# Patient Record
Sex: Female | Born: 1975 | Race: Black or African American | Hispanic: No | Smoking: Current every day smoker
Health system: Southern US, Community
[De-identification: ages and names within clinical notes are randomized; demographics above are authoritative.]

## PROBLEM LIST (undated history)

## (undated) DIAGNOSIS — G4730 Sleep apnea, unspecified: Secondary | ICD-10-CM

## (undated) DIAGNOSIS — N393 Stress incontinence (female) (male): Secondary | ICD-10-CM

## (undated) DIAGNOSIS — E119 Type 2 diabetes mellitus without complications: Secondary | ICD-10-CM

## (undated) DIAGNOSIS — N946 Dysmenorrhea, unspecified: Secondary | ICD-10-CM

## (undated) DIAGNOSIS — R87619 Unspecified abnormal cytological findings in specimens from cervix uteri: Secondary | ICD-10-CM

## (undated) DIAGNOSIS — R7303 Prediabetes: Secondary | ICD-10-CM

## (undated) DIAGNOSIS — F32A Depression, unspecified: Secondary | ICD-10-CM

## (undated) DIAGNOSIS — Z87442 Personal history of urinary calculi: Secondary | ICD-10-CM

## (undated) DIAGNOSIS — D259 Leiomyoma of uterus, unspecified: Secondary | ICD-10-CM

## (undated) DIAGNOSIS — K219 Gastro-esophageal reflux disease without esophagitis: Secondary | ICD-10-CM

## (undated) DIAGNOSIS — F4310 Post-traumatic stress disorder, unspecified: Secondary | ICD-10-CM

## (undated) DIAGNOSIS — L309 Dermatitis, unspecified: Secondary | ICD-10-CM

## (undated) DIAGNOSIS — F419 Anxiety disorder, unspecified: Secondary | ICD-10-CM

## (undated) DIAGNOSIS — I10 Essential (primary) hypertension: Secondary | ICD-10-CM

## (undated) HISTORY — DX: Depression, unspecified: F32.A

## (undated) HISTORY — DX: Type 2 diabetes mellitus without complications: E11.9

## (undated) HISTORY — DX: Prediabetes: R73.03

## (undated) HISTORY — DX: Sleep apnea, unspecified: G47.30

## (undated) HISTORY — DX: Essential (primary) hypertension: I10

## (undated) HISTORY — DX: Anxiety disorder, unspecified: F41.9

## (undated) HISTORY — DX: Unspecified abnormal cytological findings in specimens from cervix uteri: R87.619

## (undated) HISTORY — PX: TUBAL LIGATION: SHX77

---

## 2008-10-30 ENCOUNTER — Emergency Department (HOSPITAL_COMMUNITY): Admission: EM | Admit: 2008-10-30 | Discharge: 2008-10-30 | Payer: Self-pay | Admitting: Emergency Medicine

## 2009-04-14 ENCOUNTER — Emergency Department (HOSPITAL_COMMUNITY): Admission: EM | Admit: 2009-04-14 | Discharge: 2009-04-14 | Payer: Self-pay | Admitting: Emergency Medicine

## 2009-05-19 ENCOUNTER — Emergency Department (HOSPITAL_COMMUNITY): Admission: EM | Admit: 2009-05-19 | Discharge: 2009-05-19 | Payer: Self-pay | Admitting: Emergency Medicine

## 2009-10-06 ENCOUNTER — Emergency Department (HOSPITAL_COMMUNITY): Admission: EM | Admit: 2009-10-06 | Discharge: 2009-10-06 | Payer: Self-pay | Admitting: Emergency Medicine

## 2009-10-12 ENCOUNTER — Emergency Department (HOSPITAL_COMMUNITY): Admission: EM | Admit: 2009-10-12 | Discharge: 2009-10-12 | Payer: Self-pay | Admitting: Emergency Medicine

## 2009-12-09 ENCOUNTER — Emergency Department (HOSPITAL_COMMUNITY): Admission: EM | Admit: 2009-12-09 | Discharge: 2009-12-09 | Payer: Self-pay | Admitting: Family Medicine

## 2010-03-19 ENCOUNTER — Emergency Department (HOSPITAL_COMMUNITY): Admission: EM | Admit: 2010-03-19 | Discharge: 2010-03-19 | Payer: Self-pay | Admitting: Emergency Medicine

## 2010-06-24 ENCOUNTER — Emergency Department (HOSPITAL_COMMUNITY): Admission: EM | Admit: 2010-06-24 | Discharge: 2010-06-24 | Payer: Self-pay | Admitting: Emergency Medicine

## 2010-09-04 ENCOUNTER — Emergency Department (HOSPITAL_COMMUNITY): Admission: EM | Admit: 2010-09-04 | Discharge: 2010-09-05 | Payer: Self-pay | Admitting: Emergency Medicine

## 2010-10-30 ENCOUNTER — Emergency Department (HOSPITAL_COMMUNITY)
Admission: EM | Admit: 2010-10-30 | Discharge: 2010-10-30 | Disposition: A | Discharge status: Other Acute Inpatient Hospital | Payer: Self-pay | Source: Home / Self Care | Admitting: Emergency Medicine

## 2010-10-30 ENCOUNTER — Emergency Department (HOSPITAL_COMMUNITY)
Admission: EM | Admit: 2010-10-30 | Discharge: 2010-10-30 | Payer: Self-pay | Source: Home / Self Care | Admitting: Emergency Medicine

## 2010-10-31 ENCOUNTER — Encounter (INDEPENDENT_AMBULATORY_CARE_PROVIDER_SITE_OTHER): Payer: Self-pay | Admitting: Family Medicine

## 2010-10-31 LAB — CONVERTED CEMR LAB
Albumin: 4.3 g/dL (ref 3.5–5.2)
BUN: 8 mg/dL (ref 6–23)
CO2: 23 meq/L (ref 19–32)
Calcium: 9.6 mg/dL (ref 8.4–10.5)
Chloride: 103 meq/L (ref 96–112)
Glucose, Bld: 90 mg/dL (ref 70–99)
Potassium: 4.4 meq/L (ref 3.5–5.3)
Sodium: 139 meq/L (ref 135–145)
T3, Free: 3.2 pg/mL (ref 2.3–4.2)
TSH: 1.091 microintl units/mL (ref 0.350–4.500)
Total Protein: 8.1 g/dL (ref 6.0–8.3)

## 2010-11-14 ENCOUNTER — Encounter (INDEPENDENT_AMBULATORY_CARE_PROVIDER_SITE_OTHER): Payer: Self-pay | Admitting: Family Medicine

## 2010-11-14 LAB — CONVERTED CEMR LAB
Basophils Absolute: 0 10*3/uL (ref 0.0–0.1)
Basophils Relative: 0 % (ref 0–1)
Eosinophils Relative: 3 % (ref 0–5)
Hemoglobin: 13.5 g/dL (ref 12.0–15.0)
Lymphocytes Relative: 23 % (ref 12–46)
MCHC: 32.2 g/dL (ref 30.0–36.0)
Monocytes Absolute: 1.2 10*3/uL — ABNORMAL HIGH (ref 0.1–1.0)
Neutro Abs: 5.7 10*3/uL (ref 1.7–7.7)
Platelets: 355 10*3/uL (ref 150–400)
RDW: 15.2 % (ref 11.5–15.5)
Total Protein, Urine: 32

## 2010-11-22 ENCOUNTER — Ambulatory Visit (HOSPITAL_COMMUNITY)
Admission: RE | Admit: 2010-11-22 | Discharge: 2010-11-22 | Payer: Self-pay | Source: Home / Self Care | Attending: Family Medicine | Admitting: Family Medicine

## 2010-11-23 ENCOUNTER — Encounter (INDEPENDENT_AMBULATORY_CARE_PROVIDER_SITE_OTHER): Payer: Self-pay | Admitting: Family Medicine

## 2010-11-23 LAB — CONVERTED CEMR LAB
ALT: 40 units/L — ABNORMAL HIGH (ref 0–35)
AST: 25 units/L (ref 0–37)
Alkaline Phosphatase: 109 units/L (ref 39–117)
BUN: 14 mg/dL (ref 6–23)
Chloride: 100 meq/L (ref 96–112)
Creatinine, Ser: 0.67 mg/dL (ref 0.40–1.20)
Potassium: 4.3 meq/L (ref 3.5–5.3)

## 2010-11-27 ENCOUNTER — Encounter (INDEPENDENT_AMBULATORY_CARE_PROVIDER_SITE_OTHER): Payer: Self-pay | Admitting: *Deleted

## 2010-11-27 LAB — CONVERTED CEMR LAB
Albumin: 4.1 g/dL (ref 3.5–5.2)
BUN: 17 mg/dL (ref 6–23)
Calcium: 8.8 mg/dL (ref 8.4–10.5)
Chloride: 102 meq/L (ref 96–112)
Creatinine 24 HR UR: 1207 mg/24hr (ref 700–1800)
Creatinine, Ser: 0.87 mg/dL (ref 0.40–1.20)
Creatinine, Urine: 112.3 mg/dL
Glucose, Bld: 139 mg/dL — ABNORMAL HIGH (ref 70–99)
Potassium: 4.2 meq/L (ref 3.5–5.3)
Protein, 24H Urine: 183 mg/24hr — ABNORMAL HIGH (ref 50–100)

## 2010-12-05 ENCOUNTER — Ambulatory Visit (HOSPITAL_COMMUNITY)
Admission: RE | Admit: 2010-12-05 | Discharge: 2010-12-05 | Disposition: A | Discharge status: Home / Self Care | Payer: Self-pay | Source: Ambulatory Visit | Attending: Family Medicine | Admitting: Family Medicine

## 2010-12-05 DIAGNOSIS — R609 Edema, unspecified: Secondary | ICD-10-CM | POA: Insufficient documentation

## 2010-12-05 DIAGNOSIS — R0602 Shortness of breath: Secondary | ICD-10-CM | POA: Insufficient documentation

## 2010-12-08 ENCOUNTER — Emergency Department (HOSPITAL_COMMUNITY)
Admission: EM | Admit: 2010-12-08 | Discharge: 2010-12-08 | Discharge status: Against Medical Advice | Payer: Self-pay | Attending: Emergency Medicine | Admitting: Emergency Medicine

## 2010-12-13 ENCOUNTER — Encounter (INDEPENDENT_AMBULATORY_CARE_PROVIDER_SITE_OTHER): Payer: Self-pay | Admitting: *Deleted

## 2010-12-13 LAB — CONVERTED CEMR LAB: Sed Rate: 11 mm/hr (ref 0–22)

## 2011-01-08 LAB — POCT I-STAT, CHEM 8
BUN: 9 mg/dL (ref 6–23)
Calcium, Ion: 1.04 mmol/L — ABNORMAL LOW (ref 1.12–1.32)
Creatinine, Ser: 0.7 mg/dL (ref 0.4–1.2)
Glucose, Bld: 127 mg/dL — ABNORMAL HIGH (ref 70–99)
Sodium: 140 mEq/L (ref 135–145)
TCO2: 26 mmol/L (ref 0–100)

## 2011-01-08 LAB — POCT URINALYSIS DIPSTICK
Hgb urine dipstick: NEGATIVE
Protein, ur: NEGATIVE mg/dL
Specific Gravity, Urine: 1.025 (ref 1.005–1.030)
Urobilinogen, UA: 0.2 mg/dL (ref 0.0–1.0)
pH: 6 (ref 5.0–8.0)

## 2011-01-12 LAB — POCT I-STAT, CHEM 8
Creatinine, Ser: 0.8 mg/dL (ref 0.4–1.2)
HCT: 47 % — ABNORMAL HIGH (ref 36.0–46.0)
Hemoglobin: 16 g/dL — ABNORMAL HIGH (ref 12.0–15.0)
Potassium: 4.6 mEq/L (ref 3.5–5.1)
Sodium: 137 mEq/L (ref 135–145)
TCO2: 28 mmol/L (ref 0–100)

## 2011-01-12 LAB — DIFFERENTIAL
Basophils Absolute: 0 10*3/uL (ref 0.0–0.1)
Basophils Relative: 0 % (ref 0–1)
Eosinophils Absolute: 0.2 10*3/uL (ref 0.0–0.7)
Eosinophils Relative: 2 % (ref 0–5)
Lymphs Abs: 2.6 10*3/uL (ref 0.7–4.0)
Neutrophils Relative %: 53 % (ref 43–77)

## 2011-01-12 LAB — URINALYSIS, ROUTINE W REFLEX MICROSCOPIC
Bilirubin Urine: NEGATIVE
Glucose, UA: NEGATIVE mg/dL
Hgb urine dipstick: NEGATIVE
Protein, ur: NEGATIVE mg/dL

## 2011-01-12 LAB — POCT PREGNANCY, URINE: Preg Test, Ur: NEGATIVE

## 2011-01-12 LAB — CBC
HCT: 42.7 % (ref 36.0–46.0)
Hemoglobin: 13.8 g/dL (ref 12.0–15.0)
MCH: 23.1 pg — ABNORMAL LOW (ref 26.0–34.0)
MCV: 71.5 fL — ABNORMAL LOW (ref 78.0–100.0)
Platelets: 283 10*3/uL (ref 150–400)
RDW: 15 % (ref 11.5–15.5)
WBC: 8 10*3/uL (ref 4.0–10.5)

## 2011-01-17 LAB — POCT URINALYSIS DIP (DEVICE)
Bilirubin Urine: NEGATIVE
Hgb urine dipstick: NEGATIVE
Ketones, ur: NEGATIVE mg/dL
Protein, ur: NEGATIVE mg/dL
Specific Gravity, Urine: 1.015 (ref 1.005–1.030)
pH: 6 (ref 5.0–8.0)

## 2011-01-17 LAB — RPR: RPR Ser Ql: NONREACTIVE

## 2011-01-17 LAB — GC/CHLAMYDIA PROBE AMP, GENITAL
Chlamydia, DNA Probe: NEGATIVE
GC Probe Amp, Genital: NEGATIVE

## 2011-01-17 LAB — WET PREP, GENITAL

## 2011-01-30 LAB — DIFFERENTIAL
Eosinophils Relative: 2 % (ref 0–5)
Lymphocytes Relative: 25 % (ref 12–46)
Monocytes Relative: 11 % (ref 3–12)
Neutrophils Relative %: 62 % (ref 43–77)

## 2011-01-30 LAB — COMPREHENSIVE METABOLIC PANEL
Albumin: 3.6 g/dL (ref 3.5–5.2)
BUN: 9 mg/dL (ref 6–23)
Calcium: 8.6 mg/dL (ref 8.4–10.5)
Glucose, Bld: 85 mg/dL (ref 70–99)
Total Protein: 7.5 g/dL (ref 6.0–8.3)

## 2011-01-30 LAB — URINALYSIS, ROUTINE W REFLEX MICROSCOPIC
Glucose, UA: NEGATIVE mg/dL
Hgb urine dipstick: NEGATIVE
Leukocytes, UA: NEGATIVE
Protein, ur: 30 mg/dL — AB
Specific Gravity, Urine: 1.028 (ref 1.005–1.030)

## 2011-01-30 LAB — HCG, SERUM, QUALITATIVE: Preg, Serum: NEGATIVE

## 2011-01-30 LAB — CBC
HCT: 34.3 % — ABNORMAL LOW (ref 36.0–46.0)
Hemoglobin: 10.7 g/dL — ABNORMAL LOW (ref 12.0–15.0)
MCHC: 31.1 g/dL (ref 30.0–36.0)
Platelets: 268 10*3/uL (ref 150–400)
RDW: 19.3 % — ABNORMAL HIGH (ref 11.5–15.5)

## 2011-01-30 LAB — URINE MICROSCOPIC-ADD ON

## 2011-02-12 LAB — URINALYSIS, ROUTINE W REFLEX MICROSCOPIC
Nitrite: NEGATIVE
Protein, ur: NEGATIVE mg/dL
Specific Gravity, Urine: 1.03 (ref 1.005–1.030)
Urobilinogen, UA: 1 mg/dL (ref 0.0–1.0)

## 2011-02-12 LAB — URINE MICROSCOPIC-ADD ON

## 2011-02-12 LAB — POCT I-STAT, CHEM 8
BUN: 10 mg/dL (ref 6–23)
Calcium, Ion: 1.12 mmol/L (ref 1.12–1.32)
Chloride: 104 mEq/L (ref 96–112)
Creatinine, Ser: 0.6 mg/dL (ref 0.4–1.2)
Sodium: 139 mEq/L (ref 135–145)
TCO2: 25 mmol/L (ref 0–100)

## 2011-03-09 ENCOUNTER — Inpatient Hospital Stay (INDEPENDENT_AMBULATORY_CARE_PROVIDER_SITE_OTHER)
Admission: RE | Admit: 2011-03-09 | Discharge: 2011-03-09 | Disposition: A | Discharge status: Home / Self Care | Payer: Self-pay | Source: Ambulatory Visit | Attending: Family Medicine | Admitting: Family Medicine

## 2011-03-09 DIAGNOSIS — J029 Acute pharyngitis, unspecified: Secondary | ICD-10-CM

## 2011-03-13 ENCOUNTER — Emergency Department (HOSPITAL_COMMUNITY)
Admission: EM | Admit: 2011-03-13 | Discharge: 2011-03-14 | Disposition: A | Discharge status: Home / Self Care | Payer: Self-pay | Attending: Emergency Medicine | Admitting: Emergency Medicine

## 2011-03-13 DIAGNOSIS — H9209 Otalgia, unspecified ear: Secondary | ICD-10-CM | POA: Insufficient documentation

## 2011-03-13 DIAGNOSIS — F341 Dysthymic disorder: Secondary | ICD-10-CM | POA: Insufficient documentation

## 2011-03-13 DIAGNOSIS — J029 Acute pharyngitis, unspecified: Secondary | ICD-10-CM | POA: Insufficient documentation

## 2011-03-13 LAB — POCT PREGNANCY, URINE: Preg Test, Ur: NEGATIVE

## 2013-10-15 ENCOUNTER — Emergency Department (HOSPITAL_COMMUNITY)
Admission: EM | Admit: 2013-10-15 | Discharge: 2013-10-15 | Disposition: A | Discharge status: Home / Self Care | Payer: Self-pay | Attending: Emergency Medicine | Admitting: Emergency Medicine

## 2013-10-15 ENCOUNTER — Encounter (HOSPITAL_COMMUNITY): Payer: Self-pay | Admitting: Emergency Medicine

## 2013-10-15 ENCOUNTER — Emergency Department (HOSPITAL_COMMUNITY): Payer: Self-pay

## 2013-10-15 DIAGNOSIS — Y9229 Other specified public building as the place of occurrence of the external cause: Secondary | ICD-10-CM | POA: Insufficient documentation

## 2013-10-15 DIAGNOSIS — S8011XA Contusion of right lower leg, initial encounter: Secondary | ICD-10-CM

## 2013-10-15 DIAGNOSIS — F17200 Nicotine dependence, unspecified, uncomplicated: Secondary | ICD-10-CM | POA: Insufficient documentation

## 2013-10-15 DIAGNOSIS — W208XXA Other cause of strike by thrown, projected or falling object, initial encounter: Secondary | ICD-10-CM | POA: Insufficient documentation

## 2013-10-15 DIAGNOSIS — Y939 Activity, unspecified: Secondary | ICD-10-CM | POA: Insufficient documentation

## 2013-10-15 DIAGNOSIS — S8010XA Contusion of unspecified lower leg, initial encounter: Secondary | ICD-10-CM | POA: Insufficient documentation

## 2013-10-15 MED ORDER — IBUPROFEN 200 MG PO TABS
600.0000 mg | ORAL_TABLET | Freq: Once | ORAL | Status: AC
  Administered 2013-10-15: 600 mg via ORAL
  Filled 2013-10-15: qty 3

## 2013-10-15 MED ORDER — IBUPROFEN 600 MG PO TABS: 600.0000 mg | ORAL_TABLET | Freq: Three times a day (TID) | ORAL | Status: DC

## 2013-10-15 NOTE — ED Notes (Signed)
Patient states that she had a mannequin fall on her leg. Now her  Left leg and knee hurts

## 2013-10-15 NOTE — ED Provider Notes (Signed)
CSN: [id number]     Arrival date & time 10/15/13  2034 History  This chart was scribed for non-physician practitioner Earley Favor working with Lyanne Co, MD by Clydene Laming, ED Scribe. This patient was seen in room WTR7/WTR7 and the patient's care was started at 9:10 PM.   Chief Complaint  Patient presents with  . Knee Pain    The history is provided by the patient. No language interpreter was used.   HPI Comments: Brianna Gutierrez is a 37 y.o. female who presents to the Emergency Department complaining of left knee pain radiating down the lower leg onset today around 3:30 PM. Pt states she was in the hair store when the head of the mannequin fell onto her leg. Pt has not taken any pain medications for pain. She also has not applied ice to the area.   History reviewed. No pertinent past medical history. History reviewed. No pertinent past surgical history. History reviewed. No pertinent family history. History  Substance Use Topics  . Smoking status: Current Every Day Smoker  . Smokeless tobacco: Not on file  . Alcohol Use: No   OB History   Grav Para Term Preterm Abortions TAB SAB Ect Mult Living                 Review of Systems  Musculoskeletal: Negative for joint swelling and myalgias.  Skin: Positive for color change. Negative for wound.  Neurological: Negative for weakness and numbness.    Allergies  Percocet  Home Medications   Current Outpatient Rx  Name  Route  Sig  Dispense  Refill  . ibuprofen (ADVIL,MOTRIN) 600 MG tablet   Oral   Take 1 tablet (600 mg total) by mouth 3 (three) times daily.   30 tablet   0    Triage Vitals:BP 132/93  Pulse 93  Temp(Src) 98.7 F (37.1 C) (Oral)  Resp 18  Wt 160 lb (72.576 kg)  SpO2 100%  LMP 09/24/2013 Physical Exam  Nursing note and vitals reviewed. Constitutional: She is oriented to person, place, and time. She appears well-developed and well-nourished.  HENT:  Head: Normocephalic and atraumatic.  Eyes: Pupils  are equal, round, and reactive to light.  Cardiovascular: Normal rate.   Musculoskeletal: Normal range of motion. She exhibits tenderness. She exhibits no edema.       Legs: Neurological: She is alert and oriented to person, place, and time.  Skin: Skin is warm and dry.    ED Course  Procedures (including critical care time) DIAGNOSTIC STUDIES: Oxygen Saturation is 100% on RA, normal by my interpretation.    COORDINATION OF CARE: 9:13 PM- Discussed treatment plan with pt at bedside. Pt verbalized understanding and agreement with plan.   Labs Review Labs Reviewed - No data to display Imaging Review Dg Knee Complete 4 Views Left  10/15/2013   CLINICAL DATA:  Trauma, mannequin fell on left lower leg, pain at proximal to mid left lower leg  EXAM: LEFT KNEE - COMPLETE 4+ VIEW  COMPARISON:  03/19/2010  FINDINGS: Bone island at medial femoral condyle.  Osseous mineralization grossly normal for technique.  Joint spaces preserved.  No acute fracture, dislocation or bone destruction. No knee joint effusion.  IMPRESSION: No acute osseous abnormalities.   Electronically Signed   By: Ulyses Southward M.D.   On: 10/15/2013 21:15    EKG Interpretation   None       MDM   1. Multiple leg contusions, right, initial encounter    I  personally performed the services described in this documentation, which was scribed in my presence. The recorded information has been reviewed and is accurate. I personally performed the services described in this documentation, which was scribed in my presence. The recorded information has been reviewed and is accurate.    Arman Filter, NP 10/15/13 2136

## 2013-10-15 NOTE — ED Provider Notes (Signed)
Medical screening examination/treatment/procedure(s) were performed by non-physician practitioner and as supervising physician I was immediately available for consultation/collaboration.  Lyanne Co, MD 10/15/13 2236

## 2014-07-07 ENCOUNTER — Encounter (HOSPITAL_COMMUNITY): Payer: Self-pay | Admitting: Emergency Medicine

## 2014-07-07 ENCOUNTER — Emergency Department (HOSPITAL_COMMUNITY): Payer: Self-pay

## 2014-07-07 ENCOUNTER — Emergency Department (HOSPITAL_COMMUNITY)
Admission: EM | Admit: 2014-07-07 | Discharge: 2014-07-07 | Disposition: A | Discharge status: Home / Self Care | Payer: Self-pay | Attending: Emergency Medicine | Admitting: Emergency Medicine

## 2014-07-07 DIAGNOSIS — R51 Headache: Secondary | ICD-10-CM | POA: Insufficient documentation

## 2014-07-07 DIAGNOSIS — R42 Dizziness and giddiness: Secondary | ICD-10-CM | POA: Insufficient documentation

## 2014-07-07 DIAGNOSIS — R519 Headache, unspecified: Secondary | ICD-10-CM

## 2014-07-07 DIAGNOSIS — I10 Essential (primary) hypertension: Secondary | ICD-10-CM | POA: Insufficient documentation

## 2014-07-07 DIAGNOSIS — Z3202 Encounter for pregnancy test, result negative: Secondary | ICD-10-CM | POA: Insufficient documentation

## 2014-07-07 DIAGNOSIS — F17200 Nicotine dependence, unspecified, uncomplicated: Secondary | ICD-10-CM | POA: Insufficient documentation

## 2014-07-07 DIAGNOSIS — M722 Plantar fascial fibromatosis: Secondary | ICD-10-CM | POA: Insufficient documentation

## 2014-07-07 HISTORY — DX: Essential (primary) hypertension: I10

## 2014-07-07 LAB — I-STAT CHEM 8, ED
BUN: 9 mg/dL (ref 6–23)
CREATININE: 0.7 mg/dL (ref 0.50–1.10)
Calcium, Ion: 1.15 mmol/L (ref 1.12–1.23)
Chloride: 106 mEq/L (ref 96–112)
GLUCOSE: 131 mg/dL — AB (ref 70–99)
HEMATOCRIT: 42 % (ref 36.0–46.0)
HEMOGLOBIN: 14.3 g/dL (ref 12.0–15.0)
Potassium: 3.7 mEq/L (ref 3.7–5.3)
SODIUM: 139 meq/L (ref 137–147)
TCO2: 26 mmol/L (ref 0–100)

## 2014-07-07 LAB — URINALYSIS, ROUTINE W REFLEX MICROSCOPIC
Glucose, UA: NEGATIVE mg/dL
HGB URINE DIPSTICK: NEGATIVE
Ketones, ur: NEGATIVE mg/dL
Leukocytes, UA: NEGATIVE
Nitrite: NEGATIVE
PROTEIN: NEGATIVE mg/dL
Specific Gravity, Urine: 1.021 (ref 1.005–1.030)
UROBILINOGEN UA: 1 mg/dL (ref 0.0–1.0)
pH: 5.5 (ref 5.0–8.0)

## 2014-07-07 LAB — PREGNANCY, URINE: Preg Test, Ur: NEGATIVE

## 2014-07-07 MED ORDER — LISINOPRIL 20 MG PO TABS: 20.0000 mg | ORAL_TABLET | Freq: Every day | ORAL | Status: DC

## 2014-07-07 MED ORDER — KETOROLAC TROMETHAMINE 30 MG/ML IJ SOLN
30.0000 mg | Freq: Once | INTRAMUSCULAR | Status: AC
  Administered 2014-07-07: 30 mg via INTRAVENOUS
  Filled 2014-07-07: qty 1

## 2014-07-07 MED ORDER — BUTALBITAL-APAP-CAFFEINE 50-325-40 MG PO TABS: 1.0000 | ORAL_TABLET | Freq: Four times a day (QID) | ORAL | Status: DC | PRN

## 2014-07-07 MED ORDER — LISINOPRIL 10 MG PO TABS
10.0000 mg | ORAL_TABLET | Freq: Once | ORAL | Status: AC
  Administered 2014-07-07: 10 mg via ORAL
  Filled 2014-07-07: qty 1

## 2014-07-07 MED ORDER — SODIUM CHLORIDE 0.9 % IV BOLUS (SEPSIS)
1000.0000 mL | Freq: Once | INTRAVENOUS | Status: AC
Start: 2014-07-07 — End: 2014-07-07
  Administered 2014-07-07: 1000 mL via INTRAVENOUS

## 2014-07-07 NOTE — Progress Notes (Signed)
[facility] Community Liaison Stacy,  ° °Provided pt with a list of primary care resources and a [facility] [employer] Card application to help patient establish primary care.  °

## 2014-07-07 NOTE — Discharge Instructions (Signed)
Read the information below.  Use the prescribed medication as directed.  Please discuss all new medications with your pharmacist.  Do not take additional tylenol while taking the prescribed pain medication to avoid overdose.  You may return to the Emergency Department at any time for worsening condition or any new symptoms that concern you.  Please check you blood pressure regularly and follow closely with a primary care provider to revise or adjust your blood pressure medication.  If you develop any swelling of your lips, mouth, or throat, or any difficulty swallowing or breathing, call 911 and return immediately to the Emergency Department.      You are having a headache. No specific cause was found today for your headache. It may have been a migraine or other cause of headache. Stress, anxiety, fatigue, and depression are common triggers for headaches. Your headache today does not appear to be life-threatening or require hospitalization, but often the exact cause of headaches is not determined in the emergency department. Therefore, follow-up with your doctor is very important to find out what may have caused your headache, and whether or not you need any further diagnostic testing or treatment. Sometimes headaches can appear benign (not harmful), but then more serious symptoms can develop which should prompt an immediate re-evaluation by your doctor or the emergency department. SEEK MEDICAL ATTENTION IF: You develop possible problems with medications prescribed.  The medications don't resolve your headache, if it recurs , or if you have multiple episodes of vomiting or can't take fluids. You have a change from the usual headache. RETURN IMMEDIATELY IF you develop a sudden, severe headache or confusion, become poorly responsive or faint, develop a fever above 100.109F or problem breathing, have a change in speech, vision, swallowing, or understanding, or develop new weakness, numbness, tingling,  incoordination, or have a seizure.   General Headache Without Cause A headache is pain or discomfort felt around the head or neck area. The specific cause of a headache may not be found. There are many causes and types of headaches. A few common ones are:  Tension headaches.  Migraine headaches.  Cluster headaches.  Chronic daily headaches. HOME CARE INSTRUCTIONS   Keep all follow-up appointments with your caregiver or any specialist referral.  Only take over-the-counter or prescription medicines for pain or discomfort as directed by your caregiver.  Lie down in a dark, quiet room when you have a headache.  Keep a headache journal to find out what may trigger your migraine headaches. For example, write down:  What you eat and drink.  How much sleep you get.  Any change to your diet or medicines.  Try massage or other relaxation techniques.  Put ice packs or heat on the head and neck. Use these 3 to 4 times per day for 15 to 20 minutes each time, or as needed.  Limit stress.  Sit up straight, and do not tense your muscles.  Quit smoking if you smoke.  Limit alcohol use.  Decrease the amount of caffeine you drink, or stop drinking caffeine.  Eat and sleep on a regular schedule.  Get 7 to 9 hours of sleep, or as recommended by your caregiver.  Keep lights dim if bright lights bother you and make your headaches worse. SEEK MEDICAL CARE IF:   You have problems with the medicines you were prescribed.  Your medicines are not working.  You have a change from the usual headache.  You have nausea or vomiting. Tri[place]  IF:   Your headache becomes severe.  You have a fever.  You have a stiff neck.  You have loss of vision.  You have muscular weakness or loss of muscle control.  You start losing your balance or have trouble walking.  You feel faint or pass out.  You have severe symptoms that are different from your first symptoms. MAKE  SURE YOU:   Understand these instructions.  Will watch your condition.  Will get help right away if you are not doing well or get worse. Document Released: 10/15/2005 Document Revised: 01/07/2012 Document Reviewed: 10/31/2011 [facility] Patient Information 2015 [place], [place]. This information is not intended to replace advice given to you by your health care provider. Make sure you discuss any questions you have with your health care provider.  Dizziness Dizziness is a common problem. It is a feeling of unsteadiness or light-headedness. You may feel like you are about to faint. Dizziness can lead to injury if you stumble or fall. A person of any age group can suffer from dizziness, but dizziness is more common in older adults. CAUSES  Dizziness can be caused by many different things, including:  Middle ear problems.  Standing for too long.  Infections.  An allergic reaction.  Aging.  An emotional response to something, such as the sight of blood.  Side effects of medicines.  Tiredness.  Problems with circulation or blood pressure.  Excessive use of alcohol or medicines, or illegal drug use.  Breathing too fast (hyperventilation).  An irregular heart rhythm (arrhythmia).  A low red blood cell count (anemia).  Pregnancy.  Vomiting, diarrhea, fever, or other illnesses that cause body fluid loss (dehydration).  Diseases or conditions such as Parkinson's disease, high blood pressure (hypertension), diabetes, and thyroid problems.  Exposure to extreme heat. DIAGNOSIS  Your health care provider will ask about your symptoms, perform a physical exam, and perform an electrocardiogram (ECG) to record the electrical activity of your heart. Your health care provider may also perform other heart or blood tests to determine the cause of your dizziness. These may include:  Transthoracic echocardiogram (TTE). During echocardiography, sound waves are used to evaluate how blood flows  through your heart.  Transesophageal echocardiogram (TEE).  Cardiac monitoring. This allows your health care provider to monitor your heart rate and rhythm in real time.  Holter monitor. This is a portable device that records your heartbeat and can help diagnose heart arrhythmias. It allows your health care provider to track your heart activity for several days if needed.  Stress tests by exercise or by giving medicine that makes the heart beat faster. TREATMENT  Treatment of dizziness depends on the cause of your symptoms and can vary greatly. HOME CARE INSTRUCTIONS   Drink enough fluids to keep your urine clear or pale yellow. This is especially important in very hot weather. In older adults, it is also important in cold weather.  Take your medicine exactly as directed if your dizziness is caused by medicines. When taking blood pressure medicines, it is especially important to get up slowly.  Rise slowly from chairs and steady yourself until you feel okay.  In the morning, first sit up on the side of the bed. When you feel okay, stand slowly while holding onto something until you know your balance is fine.  Move your legs often if you need to stand in one place for a long time. Tighten and relax your muscles in your legs while standing.  Have someone stay with  you for 1-2 days if dizziness continues to be a problem. Do this until you feel you are well enough to stay alone. Have the person call your health care provider if he or she notices changes in you that are concerning.  Do not drive or use heavy machinery if you feel dizzy.  Do not drink alcohol. SEEK IMMEDIATE MEDICAL CARE IF:   Your dizziness or light-headedness gets worse.  You feel nauseous or vomit.  You have problems talking, walking, or using your arms, hands, or legs.  You feel weak.  You are not thinking clearly or you have trouble forming sentences. It may take a friend or family member to notice this.  You  have chest pain, abdominal pain, shortness of breath, or sweating.  Your vision changes.  You notice any bleeding.  You have side effects from medicine that seems to be getting worse rather than better. MAKE SURE YOU:   Understand these instructions.  Will watch your condition.  Will get help right away if you are not doing well or get worse. Document Released: 04/10/2001 Document Revised: 10/20/2013 Document Reviewed: 05/04/2011 [facility] Patient Information 2015 [place], [place]. This information is not intended to replace advice given to you by your health care provider. Make sure you discuss any questions you have with your health care provider.  Hypertension Hypertension, commonly called high blood pressure, is when the force of blood pumping through your arteries is too strong. Your arteries are the blood vessels that carry blood from your heart throughout your body. A blood pressure reading consists of a higher number over a lower number, such as 110/72. The higher number (systolic) is the pressure inside your arteries when your heart pumps. The lower number (diastolic) is the pressure inside your arteries when your heart relaxes. Ideally you want your blood pressure below 120/80. Hypertension forces your heart to work harder to pump blood. Your arteries may become narrow or stiff. Having hypertension puts you at risk for heart disease, stroke, and other problems.  RISK FACTORS Some risk factors for high blood pressure are controllable. Others are not.  Risk factors you cannot control include:   Race. You may be at higher risk if you are African American.  Age. Risk increases with age.  Gender. Men are at higher risk than women before age 35 years. After age 42, women are at higher risk than men. Risk factors you can control include:  Not getting enough exercise or physical activity.  Being overweight.  Getting too much fat, sugar, calories, or salt in your diet.  Drinking  too much alcohol. SIGNS AND SYMPTOMS Hypertension does not usually cause signs or symptoms. Extremely high blood pressure (hypertensive crisis) may cause headache, anxiety, shortness of breath, and nosebleed. DIAGNOSIS  To check if you have hypertension, your health care provider will measure your blood pressure while you are seated, with your arm held at the level of your heart. It should be measured at least twice using the same arm. Certain conditions can cause a difference in blood pressure between your right and left arms. A blood pressure reading that is higher than normal on one occasion does not mean that you need treatment. If one blood pressure reading is high, ask your health care provider about having it checked again. TREATMENT  Treating high blood pressure includes making lifestyle changes and possibly taking medicine. Living a healthy lifestyle can help lower high blood pressure. You may need to change some of your habits. Lifestyle changes  may include:  Following the DASH diet. This diet is high in fruits, vegetables, and whole grains. It is low in salt, red meat, and added sugars.  Getting at least 2 hours of brisk physical activity every week.  Losing weight if necessary.  Not smoking.  Limiting alcoholic beverages.  Learning ways to reduce stress. If lifestyle changes are not enough to get your blood pressure under control, your health care provider may prescribe medicine. You may need to take more than one. Work closely with your health care provider to understand the risks and benefits. HOME CARE INSTRUCTIONS  Have your blood pressure rechecked as directed by your health care provider.   Take medicines only as directed by your health care provider. Follow the directions carefully. Blood pressure medicines must be taken as prescribed. The medicine does not work as well when you skip doses. Skipping doses also puts you at risk for problems.   Do not smoke.    Monitor your blood pressure at home as directed by your health care provider. SEEK MEDICAL CARE IF:   You think you are having a reaction to medicines taken.  You have recurrent headaches or feel dizzy.  You have swelling in your ankles.  You have trouble with your vision. SEEK IMMEDIATE MEDICAL CARE IF:  You develop a severe headache or confusion.  You have unusual weakness, numbness, or feel faint.  You have severe chest or abdominal pain.  You vomit repeatedly.  You have trouble breathing. MAKE SURE YOU:   Understand these instructions.  Will watch your condition.  Will get help right away if you are not doing well or get worse. Document Released: 10/15/2005 Document Revised: 03/01/2014 Document Reviewed: 08/07/2013 [facility] Patient Information 2015 [place], [place]. This information is not intended to replace advice given to you by your health care provider. Make sure you discuss any questions you have with your health care provider.    Emergency Department Resource Guide 1) Find a Doctor and Pay Out of Pocket Although you won't have to find out who is covered by your insurance plan, it is a good idea to ask around and get recommendations. You will then need to call the office and see if the doctor you have chosen will accept you as a new patient and what types of options they offer for patients who are self-pay. Some doctors offer discounts or will set up payment plans for their patients who do not have insurance, but you will need to ask so you aren't surprised when you get to your appointment.  2) Contact Your Local Health Department Not all health departments have doctors that can see patients for sick visits, but many do, so it is worth a call to see if yours does. If you don't know where your local health department is, you can check in your phone book. The CDC also has a tool to help you locate your state's health department, and many state websites also have  listings of all of their local health departments.  3) Find a [facility] If your illness is not likely to be very severe or complicated, you may want to try a walk in clinic. These are popping up all over the country in pharmacies, drugstores, and shopping centers. They're usually staffed by nurse practitioners or physician assistants that have been trained to treat common illnesses and complaints. They're usually fairly quick and inexpensive. However, if you have serious medical issues or chronic medical problems, these are probably not your  best option.  No Primary Care Doctor: - Call Health Connect at  [phone] - they can help you locate a primary care doctor that  accepts your insurance, provides certain services, etc. - Physician Referral Service- [phone]  Chronic Pain Problems: Organization         Address  Phone   Notes  [facility]  [phone] Patients need to be referred by their primary care doctor.   Medication Assistance: Organization         Address  Phone   Notes  [facility] Medication [facility] [address] [place], [place] [address] [phone] --Must be a resident of [facility] -- Must have NO insurance coverage whatsoever (no Medicaid/ Medicare, etc.) -- The pt. MUST have a primary care doctor that directs their care regularly and follows them in the community   MedAssist  [phone]   [employer]  [phone]    Agencies that provide inexpensive medical care: Organization         Address  Phone   Notes  [place]  [phone]   [facility]    [phone]   [facility] [place], [place] [address] [phone]   [place] [address] [place] [phone]   Planned Parenthood    ([phone]   [facility]    [phone]   [place] and [place]  [address], [place] Phone:  [phone], Fax:  [phone] Hours of Operation:  9 am - 6 pm, M-F.  Also accepts Medicaid/Medicare and self-pay.  [facility] for [address], [address], [place] Phone: [phone], Fax: [phone]. Hours of Operation:  8:30 am - 5:30 pm, M-F.  Also accepts Medicaid and self-pay.  [facility] [place] [address], [place] Phone: [phone]   [place], [place], [place] [phone], Ext. [phone] Mondays & Thursdays: 7-9 AM.  First 15 patients are seen on a first come, first serve basis.    [place] Providers:  Organization         Address  Phone   Notes  [facility] [place] [address], [place] [phone] Also accepts self-pay patients.  [facility] [address], [place]  [phone]   [place], [address], [place] [phone]   [facility] [address] [place] [phone]   Lucianne Lei [address], [place]   [phone] Only accepts [place] Access [place] patients after they have their name applied to their card.   Self-Pay (no insurance) in [facility]:  Organization         Address  Phone   Notes  Sickle Cell Patients, [facility] [place] [phone]   [facility] [place] [phone]   [facility] [place]  [place], [place], [place] [phone]   [facility]/Dr. Osei-Bonsu  [address] [place] or [address], [place] [phone] Phone number for both [place] and [place] locations is the same.  Urgent Medical and [facility] [address], [place] (  [place]) [phone]   [place],  [place] or [address] [phone]   [facility] [address], [place] [phone], phone; [phone], fax Sees patients 1st and 3rd Saturday of every month.  Must not qualify for public or private insurance (i.e. Medicaid, Medicare, [place] Health Choice, Veterans' Benefits)  Household income should be no more than 200% of the poverty level The clinic cannot treat you if you are pregnant or think you are pregnant  Sexually transmitted diseases are not treated at the clinic.    Dental Care: Organization         Address  Phone  Notes  [facility] Department of [facility] [place] [phone] Accepts children up to age 110 who are enrolled in [place] or [place]; pregnant women with a Medicaid card; and children who have applied for Medicaid or [place] Health Choice, but were declined, whose parents can pay a reduced fee at time of service.  [facility]  [address], [place] [phone] Accepts children up to age 48 who are enrolled in [place] or [place]; pregnant women with a Medicaid card; and children who have applied for Medicaid or [place] Health Choice, but were declined, whose parents can pay a reduced fee at time of service.  [facility] Adult Dental Access PROGRAM  [place] [phone] Patients are seen by appointment only. Walk-ins are not accepted. [place] will see patients 57 years of age and older. Monday - Tuesday (8am-5pm) Most Wednesdays (8:30-5pm) $30 per visit, cash only  [facility] Adult Dental Access PROGRAM  [address], [facility] [phone] Patients are seen by appointment only. Walk-ins are not accepted. [place] will see patients 34 years of age and older. One Wednesday Evening (Monthly: Volunteer Based).  $30 per visit, cash only  [place]  [phone] for adults; Children under age 3, call Graduate Pediatric Dentistry at [phone]. Children aged 33-14, please call [phone] to request a pediatric application.  Dental services are provided in all areas of dental care including fillings, crowns and bridges, complete and partial dentures, implants, gum treatment, root canals, and extractions. Preventive care is also provided. Treatment is provided to both adults and children. Patients are selected via a lottery and there is often a waiting list.   [facility] [address], [place]  [phone] [email or web address]   [facility] [address] [place], [place] [phone], Ext. [phone] Second and Fourth Thursday of each month, opens at 6:30 AM; Clinic ends at 9 AM.  Patients are seen on a first-come first-served basis, and a limited number are seen during each clinic.   [facility]  [address] [place]ard Danker [place], [place] [phone]   Eligibility Requirements You must have lived in [place], [place], or [place] counties for at least the last three months.   You cannot be eligible for state or federal sponsored [employer], including [employer], [place], or [employer].   You generally cannot be eligible for healthcare insurance through your employer.    How to apply: Eligibility screenings are held every Tuesday and Wednesday afternoon from 1:00 pm until 4:00 pm. You do not need an appointment for the interview!  [facility] [address], [place],  [place] Department  [place] Department  [place]  [phone]    Behavioral Health Resources in the Community: Intensive Outpatient Programs Organization         Address  Phone  Notes  [place]. [address] [place], [place] [phone]    [facility] Outpatient [address] [place], [place]   ADS: Alcohol & Drug Svcs [address] [place], [place]   [place] [address]  [place], [place] or [phone]   Substance Abuse Resources Organization         Address  Phone  Notes  Alcohol and Drug Services  [phone]   [place]  [phone]   [facility] [place]   Chinita Pester  [phone]   Residential & Outpatient Substance Abuse Program  [phone]   Psychological Services Organization         Address  Phone  Notes  [facility] [place]  [place]  [phone]   [place] [address], [place] or [phone]    Mobile Crisis Teams Organization         Address  Phone  Notes  [facility], Mobile Crisis Care Unit  [phone]   [facility]ive Psychotherapeutic Services  [address] [place], [place]   [place] [address] [place] [phone]    Self-Help/Support Groups Organization         Address  Phone             Notes  [place]. of [place] - variety of support groups  [place] Call for more information  Narcotics Anonymous (NA), [facility] [address], [employer] [place]  2 meetings at this location   Special educational needs teacher         Address  Phone  Notes  [facility] Residential Treatment [place],    [place]  [phone]   [facility]  [address] [place] [address], [place], [place]   [place], [place] [phone] Admissions: 8am-3pm M-F  Incentives Substance [place] [address],    [place], [place] [phone]   [facility] [address] [place], [place], [place]   [facility] [address],  [place],  [place]   [facility] Programs - Intensive Outpatient [address], [place], [place]   [facility] [place] ([place].,  [place], [place] [phone] or [phone]   [facility] Services (RTS) [address], [place], [place] Accepts Medicaid  [facility] [place] [address],  [place] [phone] Substance Abuse/Addiction Treatment   [facility] Organization         Address  Phone  Notes  [facility]  [phone]   Domenic Schwab, PhD [address] Arlis [place], [place]   [phone] or [phone]   [place], [place] [phone]   [place] [address], [place], [place] [phone] Insurance/Medicaid/sponsorship through [employer] and Families [address]  [place], [place] [phone] [place], [place] [phone]    Dr. Adele Schilder  [phone]   [facility] Dept. 1) [address] [place] 2) [place] 3)  [place] [address], [place] [phone]  [phone]   [place] [phone] or [phone] (After Hours)

## 2014-07-07 NOTE — ED Notes (Signed)
Per pt, states H/A since yesterday, feels weak, and dizzy

## 2014-07-07 NOTE — ED Provider Notes (Signed)
CSN: [id number]     Arrival date & time 07/07/14  88 History   First MD Initiated Contact with Patient 07/07/14 1213     Chief Complaint  Patient presents with  . headaches      (Consider location/radiation/quality/duration/timing/severity/associated sxs/prior Treatment) The history is provided by the patient.    Patient presents with lightheadedness and headache that began last week.  Headache is frontal, throbbing, worse with movement, comes and goes, now 8/10 intensity.  She is lightheaded with standing.  Has occasionally felt nausea with this.  Has generalized fatigue, exhaustion.  Check BP last week and found that it was 178/104, has never been treated for HTN.    Denies fevers, chills, myalgias, CP, SOB, URI symptoms, abdominal pain, N/V/D, change in bowels, urinary or vaginal symptoms.  LMP Aug 28.   Past Medical History  Diagnosis Date  . Hypertension    History reviewed. No pertinent past surgical history. No family history on file. History  Substance Use Topics  . Smoking status: Current Every Day Smoker  . Smokeless tobacco: Not on file  . Alcohol Use: No   OB History   Grav Para Term Preterm Abortions TAB SAB Ect Mult Living                 Review of Systems  Musculoskeletal:       Chronic bilateral plantar foot pain  All other systems reviewed and are negative.     Allergies  Percocet  Home Medications   Prior to Admission medications   Medication Sig Start Date End Date Taking? Authorizing Provider  ibuprofen (ADVIL,MOTRIN) 200 MG tablet Take 400 mg by mouth every 6 (six) hours as needed for headache.   Yes Historical Provider, MD   BP 141/102  Pulse 83  Temp(Src) 98.3 F (36.8 C) (Oral)  Resp 18  SpO2 100%  LMP 06/23/2014 Physical Exam  Nursing note and vitals reviewed. Constitutional: She appears well-developed and well-nourished. No distress.  HENT:  Head: Normocephalic and atraumatic.  Neck: Normal range of motion. Neck supple.  Full  AROM of neck without increase in pain.   Cardiovascular: Normal rate and regular rhythm.   Pulmonary/Chest: Effort normal and breath sounds normal. No respiratory distress. She has no wheezes. She has no rales.  Abdominal: Soft. She exhibits no distension. There is no tenderness. There is no rebound and no guarding.  Musculoskeletal:  Bilateral plantar aspect of feet tender to palpation at insertion site of plantar fascia.   Neurological: She is alert.  CN II-XII intact, EOMs intact, no pronator drift, grip strengths equal bilaterally; strength 5/5 in all extremities, sensation intact in all extremities; finger to nose, heel to shin, rapid alternating movements normal; gait is normal.     Skin: She is not diaphoretic.    ED Course  Procedures (including critical care time) Labs Review Labs Reviewed  URINALYSIS, ROUTINE W REFLEX MICROSCOPIC - Abnormal; Notable for the following:    Color, Urine AMBER (*)    APPearance CLOUDY (*)    Bilirubin Urine SMALL (*)    All other components within normal limits  I-STAT CHEM 8, ED - Abnormal; Notable for the following:    Glucose, Bld 131 (*)    All other components within normal limits  PREGNANCY, URINE    Imaging Review Ct Head Wo Contrast  07/07/2014   CLINICAL DATA:  Headache.  Weakness and dizziness.  Hypertension.  EXAM: CT HEAD WITHOUT CONTRAST  TECHNIQUE: Contiguous axial images were obtained from  the base of the skull through the vertex without intravenous contrast.  COMPARISON:  None.  FINDINGS: There is no evidence for acute hemorrhage, hydrocephalus, mass lesion, or abnormal extra-axial fluid collection. No definite CT evidence for acute infarction. The visualized paranasal sinuses and mastoid air cells are clear.  IMPRESSION: Normal exam.   Electronically Signed   By: Misty Stanley M.D.   On: 07/07/2014 14:14     EKG Interpretation None       Date: 07/07/2014  Rate: 85  Rhythm: normal sinus rhythm  QRS Axis: normal  Intervals:  normal  ST/T Wave abnormalities: nonspecific T wave changes  Conduction Disutrbances:none  Narrative Interpretation:   Old EKG Reviewed: none available    3:16 PM headache has improved.  MDM   Final diagnoses:  Nonintractable headache, unspecified chronicity pattern, unspecified headache type  Lightheadedness  Essential hypertension  Plantar fasciitis, bilateral    Afebrile, nontoxic patient with headache, lightheadedness, hypertension.  CT head negative. No red flags including head trauma, fevers, meningeal signs, focal neurologic deficits. Neurologically intact.  EKG with nonspecific changes.  No CP, No SOB.  Renal function normal.   Feeling better with toradol.  Given dose of lisinopril here. Discussed conservative treatment for plantar fasciitis. D/C home with lisinopril and fioricet.  PCP resources given.  Discussed strict return precautions for angioedema reaction from lisinopril.  Discussed result, findings, treatment, and follow up  with patient.  Pt given return precautions.  Pt verbalizes understanding and agrees with plan.           [place], PA-C 07/07/14 1546

## 2014-07-08 NOTE — ED Provider Notes (Signed)
Medical screening examination/treatment/procedure(s) were performed by non-physician practitioner and as supervising physician I was immediately available for consultation/collaboration.   EKG Interpretation None        Mariea Clonts, MD 07/08/14 1005

## 2014-08-14 ENCOUNTER — Emergency Department (HOSPITAL_COMMUNITY): Payer: Self-pay

## 2014-08-14 ENCOUNTER — Encounter (HOSPITAL_COMMUNITY): Payer: Self-pay | Admitting: Emergency Medicine

## 2014-08-14 ENCOUNTER — Emergency Department (HOSPITAL_COMMUNITY)
Admission: EM | Admit: 2014-08-14 | Discharge: 2014-08-14 | Disposition: A | Discharge status: Home / Self Care | Payer: Self-pay | Attending: Emergency Medicine | Admitting: Emergency Medicine

## 2014-08-14 DIAGNOSIS — R05 Cough: Secondary | ICD-10-CM | POA: Insufficient documentation

## 2014-08-14 DIAGNOSIS — Z79899 Other long term (current) drug therapy: Secondary | ICD-10-CM | POA: Insufficient documentation

## 2014-08-14 DIAGNOSIS — R6889 Other general symptoms and signs: Secondary | ICD-10-CM

## 2014-08-14 DIAGNOSIS — R112 Nausea with vomiting, unspecified: Secondary | ICD-10-CM | POA: Insufficient documentation

## 2014-08-14 DIAGNOSIS — J3489 Other specified disorders of nose and nasal sinuses: Secondary | ICD-10-CM | POA: Insufficient documentation

## 2014-08-14 DIAGNOSIS — I10 Essential (primary) hypertension: Secondary | ICD-10-CM | POA: Insufficient documentation

## 2014-08-14 DIAGNOSIS — Z3202 Encounter for pregnancy test, result negative: Secondary | ICD-10-CM | POA: Insufficient documentation

## 2014-08-14 LAB — CBC WITH DIFFERENTIAL/PLATELET
BASOS ABS: 0 10*3/uL (ref 0.0–0.1)
BASOS PCT: 0 % (ref 0–1)
EOS ABS: 0.6 10*3/uL (ref 0.0–0.7)
Eosinophils Relative: 6 % — ABNORMAL HIGH (ref 0–5)
HEMATOCRIT: 38.5 % (ref 36.0–46.0)
Hemoglobin: 12.3 g/dL (ref 12.0–15.0)
Lymphocytes Relative: 16 % (ref 12–46)
Lymphs Abs: 1.6 10*3/uL (ref 0.7–4.0)
MCH: 22.7 pg — AB (ref 26.0–34.0)
MCHC: 31.9 g/dL (ref 30.0–36.0)
MCV: 71.2 fL — ABNORMAL LOW (ref 78.0–100.0)
MONOS PCT: 9 % (ref 3–12)
Monocytes Absolute: 0.9 10*3/uL (ref 0.1–1.0)
NEUTROS ABS: 7.2 10*3/uL (ref 1.7–7.7)
Neutrophils Relative %: 70 % (ref 43–77)
Platelets: 357 10*3/uL (ref 150–400)
RBC: 5.41 MIL/uL — ABNORMAL HIGH (ref 3.87–5.11)
RDW: 14.9 % (ref 11.5–15.5)
WBC: 10.4 10*3/uL (ref 4.0–10.5)

## 2014-08-14 LAB — BASIC METABOLIC PANEL
ANION GAP: 12 (ref 5–15)
BUN: 7 mg/dL (ref 6–23)
CHLORIDE: 102 meq/L (ref 96–112)
CO2: 24 mEq/L (ref 19–32)
CREATININE: 0.59 mg/dL (ref 0.50–1.10)
Calcium: 8.8 mg/dL (ref 8.4–10.5)
Glucose, Bld: 97 mg/dL (ref 70–99)
Potassium: 5.2 mEq/L (ref 3.7–5.3)
Sodium: 138 mEq/L (ref 137–147)

## 2014-08-14 LAB — I-STAT CHEM 8, ED
BUN: 4 mg/dL — ABNORMAL LOW (ref 6–23)
CALCIUM ION: 1.14 mmol/L (ref 1.12–1.23)
CHLORIDE: 107 meq/L (ref 96–112)
Creatinine, Ser: 0.5 mg/dL (ref 0.50–1.10)
Glucose, Bld: 101 mg/dL — ABNORMAL HIGH (ref 70–99)
HEMATOCRIT: 40 % (ref 36.0–46.0)
HEMOGLOBIN: 13.6 g/dL (ref 12.0–15.0)
Potassium: 3.5 mEq/L — ABNORMAL LOW (ref 3.7–5.3)
Sodium: 140 mEq/L (ref 137–147)
TCO2: 21 mmol/L (ref 0–100)

## 2014-08-14 LAB — POC URINE PREG, ED: PREG TEST UR: NEGATIVE

## 2014-08-14 MED ORDER — BENZONATATE 100 MG PO CAPS: 200.0000 mg | ORAL_CAPSULE | Freq: Two times a day (BID) | ORAL | Status: DC | PRN

## 2014-08-14 MED ORDER — SODIUM CHLORIDE 0.9 % IV BOLUS (SEPSIS)
1000.0000 mL | Freq: Once | INTRAVENOUS | Status: AC
  Administered 2014-08-14: 1000 mL via INTRAVENOUS

## 2014-08-14 MED ORDER — ONDANSETRON HCL 4 MG PO TABS
4.0000 mg | ORAL_TABLET | Freq: Once | ORAL | Status: AC
  Administered 2014-08-14: 4 mg via ORAL
  Filled 2014-08-14: qty 1

## 2014-08-14 MED ORDER — ALBUTEROL SULFATE HFA 108 (90 BASE) MCG/ACT IN AERS
2.0000 | INHALATION_SPRAY | Freq: Once | RESPIRATORY_TRACT | Status: AC
  Administered 2014-08-14: 2 via RESPIRATORY_TRACT
  Filled 2014-08-14: qty 6.7

## 2014-08-14 MED ORDER — IBUPROFEN 800 MG PO TABS
800.0000 mg | ORAL_TABLET | Freq: Once | ORAL | Status: AC
  Administered 2014-08-14: 800 mg via ORAL
  Filled 2014-08-14: qty 1

## 2014-08-14 MED ORDER — ACETAMINOPHEN 325 MG PO TABS
650.0000 mg | ORAL_TABLET | Freq: Once | ORAL | Status: AC
  Administered 2014-08-14: 650 mg via ORAL
  Filled 2014-08-14: qty 2

## 2014-08-14 NOTE — ED Notes (Signed)
Pt states that she has had generalized body aches and a headache x 3 days. Pt denies fever. Pt endorses nausea and vomiting.

## 2014-08-14 NOTE — Discharge Instructions (Signed)
Return to the emergency room with worsening of symptoms, new symptoms or with symptoms that are concerning, especially fevers, stiff neck, worsening headache, nausea/vomiting, visual changes or slurred speech, chest pain, shortness of breath, cough with thick colored mucous or blood.  Please call your doctor for a followup appointment within 24-48 hours. When you talk to your doctor please let them know that you were seen in the emergency department and have them acquire all of your records so that they can discuss the findings with you and formulate a treatment plan to fully care for your new and ongoing problems. If you do not have a primary care provider please call the number below under ED resources to establish care with a provider and follow up.    Emergency Department Resource Guide 1) Find a Doctor and Pay Out of Pocket Although you won't have to find out who is covered by your insurance plan, it is a good idea to ask around and get recommendations. You will then need to call the office and see if the doctor you have chosen will accept you as a new patient and what types of options they offer for patients who are self-pay. Some doctors offer discounts or will set up payment plans for their patients who do not have insurance, but you will need to ask so you aren't surprised when you get to your appointment.  2) Contact Your Local Health Department Not all health departments have doctors that can see patients for sick visits, but many do, so it is worth a call to see if yours does. If you don't know where your local health department is, you can check in your phone book. The CDC also has a tool to help you locate your state's health department, and many state websites also have listings of all of their local health departments.  3) Find a [facility] If your illness is not likely to be very severe or complicated, you may want to try a walk in clinic. These are popping up all over the country  in pharmacies, drugstores, and shopping centers. They're usually staffed by nurse practitioners or physician assistants that have been trained to treat common illnesses and complaints. They're usually fairly quick and inexpensive. However, if you have serious medical issues or chronic medical problems, these are probably not your best option.  No Primary Care Doctor: - Call Health Connect at  [phone] - they can help you locate a primary care doctor that  accepts your insurance, provides certain services, etc. - Physician Referral Service- [phone]  Chronic Pain Problems: Organization         Address  Phone   Notes  [facility]  [phone] Patients need to be referred by their primary care doctor.   Medication Assistance: Organization         Address  Phone   Notes  [facility] Medication [facility] [address] [place], [place] [address] [phone] --Must be a resident of [facility] -- Must have NO insurance coverage whatsoever (no Medicaid/ Medicare, etc.) -- The pt. MUST have a primary care doctor that directs their care regularly and follows them in the community   [facility]ist  [phone]   [employer]  9710511065    Agencies that provide inexpensive medical care: Organization         Address  Phone   Notes  [place]  [phone]   [facility]    [phone]   [facility] [place], [place] [address] [phone]   [place] [address] [place] [phone]   Planned Parenthood    [phone]   [facility]    [phone]   [place] and [place] [address], [place] Phone:  [phone], Fax:  [phone] Hours of Operation:  9 am - 6 pm, M-F.  Also accepts Medicaid/Medicare and self-pay.  [facility] for [place] [address], [address],  [place] Phone: [phone], Fax: [phone]. Hours of Operation:  8:30 am - 5:30 pm, M-F.  Also accepts Medicaid and self-pay.  [facility] [place] [address], [place] Phone: [phone]   [place], [place], [place] [phone], Ext. [phone] Mondays & Thursdays: 7-9 AM.  First 15 patients are seen on a first come, first serve basis.    [place] Providers:  Organization         Address  Phone   Notes  [facility] [address], [place] [phone] Also accepts self-pay patients.  [facility] [address], [place]  [phone]   [place], [address], [place] [phone]   [facility] [address] [place] [phone]   Lucianne Lei [address], [place]   [phone] Only accepts [place] Access [place] patients after they have their name applied to their card.   Self-Pay (no insurance) in [facility]:  Organization         Address  Phone   Notes  Sickle Cell Patients, [facility] [place] [phone]   [facility] [place] [phone]   [facility] [place]  [place], [place], [place] [phone]   [facility]/Dr. Osei-Bonsu  [address] [place] or [address], [place] [phone] Phone number for both [place] and [place] locations is the same.  [facility]gent [facility] and [facility] [address], [place] [phone]   [facility] [address], [place] or [address] [phone]   [facility] [address] [place] [phone], phone; [phone], fax Sees patients 1st and 3rd Saturday of every month.  Must not  qualify for public or private insurance (i.e. Medicaid, Medicare, [place] Health Choice, Veterans' Benefits)  Household income should be no more than 200% of the poverty level The clinic cannot treat you if you are pregnant or think you are pregnant  Sexually transmitted diseases are not treated at the clinic.    Dental Care: Organization         Address  Phone  Notes  [facility] [employer] [phone] Accepts children up to age 65 who are enrolled in [place] or [place]; pregnant women with a Medicaid card; and children who have applied for Medicaid or [place] Health Choice, but were declined, whose parents can pay a reduced fee at time of service.  [facility]  [address], [employer] [phone])  [phone] Accepts children up to age 43 who are enrolled in Medicaid or [place] Health Choice; pregnant women with a Medicaid card; and children who have applied for Medicaid or [place] Health Choice, but were declined, whose parents can pay a reduced fee at time of service.  [place] Adult Dental Access PROGRAM  [place] [phone] Patients are seen by appointment only. Walk-ins are not accepted. [place] will see patients 84 years of age and older. Monday - Tuesday (8am-5pm) Most Wednesdays (8:30-5pm) $30 per visit, cash only  [facility] Adult Dental Access PROGRAM  [address], [facility] [phone] Patients are seen by appointment only. Walk-ins are not accepted. [place] will see patients 65 years of age and older. One Wednesday Evening (Monthly: Volunteer Based).  $30 per visit, cash only  [place]  [phone] for adults; Children under age 57, call Graduate Pediatric Dentistry at [phone]. Children aged 75-14, please call [phone] to request a pediatric application.  Dental services are provided  in all areas of dental care including fillings, crowns and bridges, complete and partial dentures, implants, gum treatment, root canals, and extractions. Preventive care is also provided. Treatment is provided to both adults and children. Patients are selected via a lottery and there is often a waiting list.   [facility] [address] [place]  [phone] [email or web address]   [facility] [address] [place], [place] [phone], Ext. [phone] Second and Fourth Thursday of each month, opens at 6:30 AM; Clinic ends at 9 AM.  Patients are seen on a first-come first-served basis, and a limited number are seen during each clinic.   [facility]  [address] [place] Danker [place], [place] [phone]   Eligibility Requirements You must have lived in [place], [place], or [place] counties for at least the last three months.   You cannot be eligible for state or federal sponsored [employer], including [employer], [place], or [employer].   You generally cannot be eligible for healthcare insurance through your employer.    How to apply: Eligibility screenings are held every Tuesday and Wednesday afternoon from 1:00 pm until 4:00 pm. You do not need an appointment for the interview!  [facility] [address] [place], [place]   [place]  [place] Department  [place]  [phone]    Behavioral Health Resources in the Community: Intensive Outpatient Programs Organization         Address  Phone  Notes  [place]. [address] [place], [place] [phone]   [facility] [address], [place], [place]   ADS: Alcohol & Drug Svcs [address], [place], [place]   [place] [address],  [place], [place] or [phone]   Substance Abuse Resources Organization         Address  Phone  Notes  Alcohol and Drug Services  [phone]   [place]  [phone]   [facility] [place]   Chinita Pester  [phone]   Residential & Outpatient Substance Abuse Program  [phone]   Psychological Services Organization         Address  Phone  Notes  [place]  [place]  2817402459  [place] [address] [place] or [phone]    Mobile Crisis Teams Organization         Address  Phone  Notes  [facility], Mobile Crisis Care Unit  [phone]   [facility]  [address] [place], [place]   [place] Levels [address], [place] [phone]    Self-Help/Support Groups Organization         Address  Phone             Notes  [place]. of [place] - variety of support groups  [place] Call for more information  Narcotics Anonymous (NA), [facility] [address], [employer] [place]  2 meetings at this location   Special educational needs teacher         Address  Phone  Notes  [facility] Residential Treatment [place],    [place]  [phone]   [facility]  [address] [place] [phone], [place], [place]   [place], [place] [phone] Admissions: 8am-3pm M-F  Incentives Substance [place] [address],    [place], [place] [phone]   [facility] [address] [place], [place], [place]   [facility] [address],  [place], [place]   [facility]ight Programs - Intensive Outpatient [address], [place], [place]   [facility].,  [place], [place] [phone] or  [phone]   [facility] (RTS) [address], [place], [place] Accepts Medicaid  [facility] [place] [address],  [place] [phone] Substance Abuse/Addiction Treatment   [facility] Organization         Address  Phone  Notes  [facility]  [phone]   Domenic Schwab, PhD [address] [place]lis [place], [place]   [phone] or [phone]   [place], [place] [phone]   [facility] [address] [place], [place] [phone] Insurance/Medicaid/sponsorship through [facility] and Families [address], [address] [place]                                    [place], [place] [phone] [place] [address], [place] [phone]    Dr. Adele Schilder  [phone]   [facility] Dept. 1) [address], [place] 2) [place] 3)  [place], [place] [phone]  [phone]   [place] [phone] or [phone] (After Hours)

## 2014-08-14 NOTE — ED Notes (Signed)
Assisted up to Restroom for UA.

## 2014-08-14 NOTE — ED Provider Notes (Signed)
CSN: [id number]     Arrival date & time 08/14/14  0551 History   First MD Initiated Contact with Patient 08/14/14 0559     Chief Complaint  Patient presents with  . Generalized Body Aches  . Headache     (Consider location/radiation/quality/duration/timing/severity/associated sxs/prior Treatment) HPI Brianna Gutierrez is a 38 y.o. female with PMH of HTN presenting with 3 days of generalized body aches and frontal headache with sinus pressure, rhinorrhea, productive cough with thick green sputum. Small streak of red in sputum 3 days ago, none since. No sore throat. Patient endorses tactile fevers. Patient endorses nausea with intermittent vomiting, nonbloody. Patient denies abdominal pain, CP, mild increase in respirations with lying down but no other SOB, no weakness other than generalized body aches. No history of COPD, asthma, patient former smoker quit one month ago. No home inhalers. Patient taken OTC cold medications without relief.    Past Medical History  Diagnosis Date  . Hypertension    History reviewed. No pertinent past surgical history. History reviewed. No pertinent family history. History  Substance Use Topics  . Smoking status: Former Smoker    Quit date: 07/15/2014  . Smokeless tobacco: Not on file  . Alcohol Use: No   OB History   Grav Para Term Preterm Abortions TAB SAB Ect Mult Living                 Review of Systems  Constitutional: Negative for fever and chills.  HENT: Positive for congestion and rhinorrhea.   Eyes: Negative for visual disturbance.  Respiratory: Positive for cough. Negative for shortness of breath.   Cardiovascular: Negative for chest pain and palpitations.  Gastrointestinal: Positive for nausea and vomiting. Negative for diarrhea.  Genitourinary: Negative for dysuria and hematuria.  Musculoskeletal: Negative for back pain and gait problem.  Skin: Negative for rash.  Neurological: Positive for headaches. Negative for weakness.       Allergies  Percocet  Home Medications   Prior to Admission medications   Medication Sig Start Date End Date Taking? Authorizing Provider  butalbital-acetaminophen-caffeine (FIORICET) 50-325-40 MG per tablet Take 1 tablet by mouth every 6 (six) hours as needed for headache. 07/07/14  Yes Clayton Bibles, PA-C  ibuprofen (ADVIL,MOTRIN) 200 MG tablet Take 400 mg by mouth every 6 (six) hours as needed for headache.   Yes Historical Provider, MD  lisinopril (PRINIVIL,ZESTRIL) 20 MG tablet Take 1 tablet (20 mg total) by mouth daily. 07/07/14  Yes Clayton Bibles, PA-C  benzonatate (TESSALON) 100 MG capsule Take 2 capsules (200 mg total) by mouth 2 (two) times daily as needed for cough. 08/14/14   Jordan Hawks L Kelci Petrella, PA-C   BP 145/98  Pulse 96  Temp(Src) 98.2 F (36.8 C) (Oral)  Resp 16  SpO2 100%  LMP 07/29/2014 Physical Exam  Nursing note and vitals reviewed. Constitutional: She appears well-developed and well-nourished. No distress.  HENT:  Head: Normocephalic and atraumatic.  Nose: Right sinus exhibits no maxillary sinus tenderness and no frontal sinus tenderness. Left sinus exhibits no maxillary sinus tenderness and no frontal sinus tenderness.  Mouth/Throat: Mucous membranes are normal. Posterior oropharyngeal erythema present. No oropharyngeal exudate or posterior oropharyngeal edema.  Eyes: Conjunctivae and EOM are normal. Right eye exhibits no discharge. Left eye exhibits no discharge.  Neck: Normal range of motion. Neck supple.  Cardiovascular: Normal rate, regular rhythm and normal heart sounds.   Pulmonary/Chest: Effort normal. No respiratory distress.  Mild diffuse wheezing without decrease air movement. No increased work of breathing or  accessory muscle use.  Abdominal: Soft. Bowel sounds are normal. She exhibits no distension. There is no tenderness.  Lymphadenopathy:    She has no cervical adenopathy.  Neurological: She is alert.  Skin: Skin is warm and dry. She is not  diaphoretic.    ED Course  Procedures (including critical care time) Labs Review Labs Reviewed  CBC WITH DIFFERENTIAL - Abnormal; Notable for the following:    RBC 5.41 (*)    MCV 71.2 (*)    MCH 22.7 (*)    Eosinophils Relative 6 (*)    All other components within normal limits  I-STAT CHEM 8, ED - Abnormal; Notable for the following:    Potassium 3.5 (*)    BUN 4 (*)    Glucose, Bld 101 (*)    All other components within normal limits  BASIC METABOLIC PANEL  POC URINE PREG, ED    Imaging Review Dg Chest 2 View  08/14/2014   CLINICAL DATA:  Shortness of breath with productive cough and weakness 3 days.  EXAM: CHEST  2 VIEW  COMPARISON:  10/30/2010  FINDINGS: Lungs are adequately inflated without consolidation or effusion. The cardiomediastinal silhouette is within normal. There is mild curvature of the thoracolumbar spine convex to the right unchanged.  IMPRESSION: No active cardiopulmonary disease.   Electronically Signed   By: Marin Olp M.D.   On: 08/14/2014 07:05     EKG Interpretation None     Meds given in ED:  Medications  albuterol (PROVENTIL HFA;VENTOLIN HFA) 108 (90 BASE) MCG/ACT inhaler 2 puff (2 puffs Inhalation Given 08/14/14 0621)  acetaminophen (TYLENOL) tablet 650 mg (650 mg Oral Given 08/14/14 0620)  sodium chloride 0.9 % bolus 1,000 mL (0 mLs Intravenous Stopped 08/14/14 0842)  ondansetron (ZOFRAN) tablet 4 mg (4 mg Oral Given 08/14/14 0619)  ibuprofen (ADVIL,MOTRIN) tablet 800 mg (800 mg Oral Given 08/14/14 0841)    Discharge Medication List as of 08/14/2014  9:09 AM    START taking these medications   Details  benzonatate (TESSALON) 100 MG capsule Take 2 capsules (200 mg total) by mouth 2 (two) times daily as needed for cough., Starting 08/14/2014, Until Discontinued, Print          MDM   Final diagnoses:  Flu-like symptoms   Patient with symptoms consistent with influenza.  Vitals are stable. Pt afebrile in ED with tactile fevers at  home  No signs of dehydration, tolerating PO's.  Lungs with mild wheezing. Pt former smoker. Improved with albuterol in Ed. CXR negative.  Discussed the cost versus benefit of Tamiflu treatment with the patient. The patient understands that symptoms are greater than the recommended 24-48 hour window of treatment. Patient without significant PMH to warrant treatment. Patient will be discharged with instructions to orally hydrate, rest, and use over-the-counter medications such as anti-inflammatories ibuprofen and Aleve for muscle aches and Tylenol for fever.  Patient will also be given a cough suppressant. Patient without a PCP. Patient to establish care and follow up. ED resources provided.   Discussed return precautions with patient. Discussed all results and patient verbalizes understanding and agrees with plan.      Pura Spice, PA-C 08/14/14 [phone]

## 2014-08-14 NOTE — ED Notes (Signed)
Discharged using teach back method NAD noted at the time of discharge.

## 2014-08-15 NOTE — ED Provider Notes (Signed)
Medical screening examination/treatment/procedure(s) were performed by non-physician practitioner and as supervising physician I was immediately available for consultation/collaboration.   EKG Interpretation None        Julianne Rice, MD 08/15/14 [phone]

## 2015-04-22 ENCOUNTER — Emergency Department (HOSPITAL_COMMUNITY)
Admission: EM | Admit: 2015-04-22 | Discharge: 2015-04-23 | Disposition: A | Discharge status: Home / Self Care | Payer: Self-pay | Attending: Emergency Medicine | Admitting: Emergency Medicine

## 2015-04-22 ENCOUNTER — Encounter (HOSPITAL_COMMUNITY): Payer: Self-pay | Admitting: Emergency Medicine

## 2015-04-22 ENCOUNTER — Emergency Department (HOSPITAL_COMMUNITY): Payer: Self-pay

## 2015-04-22 DIAGNOSIS — Z79899 Other long term (current) drug therapy: Secondary | ICD-10-CM | POA: Insufficient documentation

## 2015-04-22 DIAGNOSIS — R531 Weakness: Secondary | ICD-10-CM | POA: Insufficient documentation

## 2015-04-22 DIAGNOSIS — I10 Essential (primary) hypertension: Secondary | ICD-10-CM | POA: Insufficient documentation

## 2015-04-22 DIAGNOSIS — R079 Chest pain, unspecified: Secondary | ICD-10-CM | POA: Insufficient documentation

## 2015-04-22 DIAGNOSIS — Z87891 Personal history of nicotine dependence: Secondary | ICD-10-CM | POA: Insufficient documentation

## 2015-04-22 DIAGNOSIS — R51 Headache: Secondary | ICD-10-CM | POA: Insufficient documentation

## 2015-04-22 LAB — I-STAT TROPONIN, ED: TROPONIN I, POC: 0 ng/mL (ref 0.00–0.08)

## 2015-04-22 LAB — CBC
HEMATOCRIT: 38.3 % (ref 36.0–46.0)
HEMOGLOBIN: 11.8 g/dL — AB (ref 12.0–15.0)
MCH: 21.2 pg — ABNORMAL LOW (ref 26.0–34.0)
MCHC: 30.8 g/dL (ref 30.0–36.0)
MCV: 68.9 fL — ABNORMAL LOW (ref 78.0–100.0)
Platelets: 356 10*3/uL (ref 150–400)
RBC: 5.56 MIL/uL — ABNORMAL HIGH (ref 3.87–5.11)
RDW: 16.1 % — ABNORMAL HIGH (ref 11.5–15.5)
WBC: 9 10*3/uL (ref 4.0–10.5)

## 2015-04-22 MED ORDER — LISINOPRIL 10 MG PO TABS
10.0000 mg | ORAL_TABLET | Freq: Once | ORAL | Status: AC
  Administered 2015-04-23: 10 mg via ORAL
  Filled 2015-04-22: qty 1

## 2015-04-22 NOTE — ED Notes (Signed)
Pt from home states she has been off her blood pressure medicine for 1 week. She states "I feel like my blood pressure is high". When asked to elaborate, she stated that she has chest pain, blurred vision, and a headache. These symptoms started today.

## 2015-04-22 NOTE — ED Provider Notes (Addendum)
CSN: [id number]     Arrival date & time 04/22/15  2242 History   First MD Initiated Contact with Patient 04/22/15 2326     Chief Complaint  Patient presents with  . Weakness  . Chest Pain  . Headache     (Consider location/radiation/quality/duration/timing/severity/associated sxs/prior Treatment) Patient is a 39 y.o. female presenting with chest pain. The history is provided by the patient. No language interpreter was used.  Chest Pain Pain location:  Substernal area and epigastric Pain quality: aching   Pain radiates to:  Does not radiate Pain radiates to the back: no   Pain severity:  Moderate Onset quality:  Gradual Duration:  1 week Timing:  Constant Progression:  Worsening Chronicity:  New   Past Medical History  Diagnosis Date  . Hypertension    Past Surgical History  Procedure Laterality Date  . Cesarian     History reviewed. No pertinent family history. History  Substance Use Topics  . Smoking status: Former Smoker    Quit date: 07/15/2014  . Smokeless tobacco: Not on file  . Alcohol Use: No   OB History    No data available     Review of Systems  Cardiovascular: Positive for chest pain.  All other systems reviewed and are negative.     Allergies  Percocet  Home Medications   Prior to Admission medications   Medication Sig Start Date End Date Taking? Authorizing Provider  benzonatate (TESSALON) 100 MG capsule Take 2 capsules (200 mg total) by mouth 2 (two) times daily as needed for cough. 08/14/14   Al Corpus, PA-C  butalbital-acetaminophen-caffeine (FIORICET) [phone] MG per tablet Take 1 tablet by mouth every 6 (six) hours as needed for headache. 07/07/14   Clayton Bibles, PA-C  ibuprofen (ADVIL,MOTRIN) 200 MG tablet Take 400 mg by mouth every 6 (six) hours as needed for headache.    Historical Provider, MD  lisinopril (PRINIVIL,ZESTRIL) 20 MG tablet Take 1 tablet (20 mg total) by mouth daily. 07/07/14   Clayton Bibles, PA-C   BP 155/95 mmHg   Pulse 84  Temp(Src) 98.2 F (36.8 C) (Oral)  Resp 20  Wt 160 lb (72.576 kg)  SpO2 95% Physical Exam  Constitutional: She is oriented to person, place, and time. She appears well-developed and well-nourished.  HENT:  Head: Normocephalic and atraumatic.  Eyes: EOM are normal. Pupils are equal, round, and reactive to light.  Neck: Normal range of motion.  Cardiovascular: Normal rate.   Pulmonary/Chest: Effort normal.  Abdominal: Soft. She exhibits no distension.  Musculoskeletal: Normal range of motion.  Neurological: She is alert and oriented to person, place, and time.  Psychiatric: She has a normal mood and affect.  Nursing note and vitals reviewed.   ED Course  Procedures (including critical care time) Labs Review Labs Reviewed  CBC - Abnormal; Notable for the following:    RBC 5.56 (*)    Hemoglobin 11.8 (*)    MCV 68.9 (*)    MCH 21.2 (*)    RDW 16.1 (*)    All other components within normal limits  BASIC METABOLIC PANEL  I-STAT TROPOININ, ED    Imaging Review No results found.   EKG Interpretation   Date/Time:  Friday April 22 2015 22:59:01 EDT Ventricular Rate:  84 PR Interval:  161 QRS Duration: 81 QT Interval:  354 QTC Calculation: 418 R Axis:   56 Text Interpretation:  Sinus rhythm Biatrial enlargement Borderline T  abnormalities, anterior leads ED PHYSICIAN INTERPRETATION AVAILABLE IN  [facility]  [place] Confirmed by TEST, Record ([id number]) on 04/24/2015 7:13:33 AM     ekg normal sinus, 84, borderline nonspecific t wave changes, normal qrs, normal qt MDM   Final diagnoses:  Essential hypertension  Nonspecific chest pain    Pt advised to take lisinopril as prescribed Resource guide.  AVS    Fransico Meadow, PA-C 04/23/15 [place], MD 04/23/15 Wallace, PA-C 05/04/15 1252  Linton Flemings, MD 05/04/15 2159

## 2015-04-23 LAB — BASIC METABOLIC PANEL
Anion gap: 8 (ref 5–15)
BUN: 16 mg/dL (ref 6–20)
CALCIUM: 8.8 mg/dL — AB (ref 8.9–10.3)
CO2: 24 mmol/L (ref 22–32)
Chloride: 108 mmol/L (ref 101–111)
Creatinine, Ser: 0.67 mg/dL (ref 0.44–1.00)
GFR calc Af Amer: >60 mL/min (ref >60)
GFR calc non Af Amer: >60 mL/min (ref >60)
Glucose, Bld: 115 mg/dL — ABNORMAL HIGH (ref 65–99)
Potassium: 3.3 mmol/L — ABNORMAL LOW (ref 3.5–5.1)
Sodium: 140 mmol/L (ref 135–145)

## 2015-04-23 MED ORDER — IBUPROFEN 200 MG PO TABS
600.0000 mg | ORAL_TABLET | Freq: Once | ORAL | Status: AC
  Administered 2015-04-23: 600 mg via ORAL
  Filled 2015-04-23: qty 3

## 2015-04-23 MED ORDER — LISINOPRIL 20 MG PO TABS: 20.0000 mg | ORAL_TABLET | Freq: Every day | ORAL | Status: DC

## 2015-04-23 NOTE — Discharge Instructions (Signed)
Chest Pain (Nonspecific) °It is often hard to give a specific diagnosis for the cause of chest pain. There is always a chance that your pain could be related to something serious, such as a heart attack or a blood clot in the lungs. You need to follow up with your health care provider for further evaluation. °CAUSES  °· Heartburn. °· Pneumonia or bronchitis. °· Anxiety or stress. °· Inflammation around your heart (pericarditis) or lung (pleuritis or pleurisy). °· A blood clot in the lung. °· A collapsed lung (pneumothorax). It can develop suddenly on its own (spontaneous pneumothorax) or from trauma to the chest. °· Shingles infection (herpes zoster virus). °The chest wall is composed of bones, muscles, and cartilage. Any of these can be the source of the pain. °· The bones can be bruised by injury. °· The muscles or cartilage can be strained by coughing or overwork. °· The cartilage can be affected by inflammation and become sore (costochondritis). °DIAGNOSIS  °Lab tests or other studies may be needed to find the cause of your pain. Your health care provider may have you take a test called an ambulatory electrocardiogram (ECG). An ECG records your heartbeat patterns over a 24-hour period. You may also have other tests, such as: °· Transthoracic echocardiogram (TTE). During echocardiography, sound waves are used to evaluate how blood flows through your heart. °· Transesophageal echocardiogram (TEE). °· Cardiac monitoring. This allows your health care provider to monitor your heart rate and rhythm in real time. °· Holter monitor. This is a portable device that records your heartbeat and can help diagnose heart arrhythmias. It allows your health care provider to track your heart activity for several days, if needed. °· Stress tests by exercise or by giving medicine that makes the heart beat faster. °TREATMENT  °· Treatment depends on what may be causing your chest pain. Treatment may include: °· Acid blockers for  heartburn. °· Anti-inflammatory medicine. °· Pain medicine for inflammatory conditions. °· Antibiotics if an infection is present. °· You may be advised to change lifestyle habits. This includes stopping smoking and avoiding alcohol, caffeine, and chocolate. °· You may be advised to keep your head raised (elevated) when sleeping. This reduces the chance of acid going backward from your stomach into your esophagus. °Most of the time, nonspecific chest pain will improve within 2-3 days with rest and mild pain medicine.  °HOME CARE INSTRUCTIONS  °· If antibiotics were prescribed, take them as directed. Finish them even if you start to feel better. °· For the next few days, avoid physical activities that bring on chest pain. Continue physical activities as directed. °· Do not use any tobacco products, including cigarettes, chewing tobacco, or electronic cigarettes. °· Avoid drinking alcohol. °· Only take medicine as directed by your health care provider. °· Follow your health care provider's suggestions for further testing if your chest pain does not go away. °· Keep any follow-up appointments you made. If you do not go to an appointment, you could develop lasting (chronic) problems with pain. If there is any problem keeping an appointment, call to reschedule. °SEEK MEDICAL CARE IF:  °· Your chest pain does not go away, even after treatment. °· You have a rash with blisters on your chest. °· You have a fever. °SEEK IMMEDIATE MEDICAL CARE IF:  °· You have increased chest pain or pain that spreads to your arm, neck, jaw, back, or abdomen. °· You have shortness of breath. °· You have an increasing cough, or you cough   up blood.  You have severe back or abdominal pain.  You feel nauseous or vomit.  You have severe weakness.  You faint.  You have chills. This is an emergency. Do not wait to see if the pain will go away. Get medical help at once. Call your local emergency services (911 in [place] Do not drive  yourself to the hospital. MAKE SURE YOU:   Understand these instructions.  Will watch your condition.  Will get help right away if you are not doing well or get worse. Document Released: 07/25/2005 Document Revised: 10/20/2013 Document Reviewed: 05/20/2008 [facility] Patient Information 2015 [place], [place]. This information is not intended to replace advice given to you by your health care provider. Make sure you discuss any questions you have with your health care provider. Hypertension Hypertension, commonly called high blood pressure, is when the force of blood pumping through your arteries is too strong. Your arteries are the blood vessels that carry blood from your heart throughout your body. A blood pressure reading consists of a higher number over a lower number, such as 110/72. The higher number (systolic) is the pressure inside your arteries when your heart pumps. The lower number (diastolic) is the pressure inside your arteries when your heart relaxes. Ideally you want your blood pressure below 120/80. Hypertension forces your heart to work harder to pump blood. Your arteries may become narrow or stiff. Having hypertension puts you at risk for heart disease, stroke, and other problems.  RISK FACTORS Some risk factors for high blood pressure are controllable. Others are not.  Risk factors you cannot control include:   Race. You may be at higher risk if you are African American.  Age. Risk increases with age.  Gender. Men are at higher risk than women before age 65 years. After age 58, women are at higher risk than men. Risk factors you can control include:  Not getting enough exercise or physical activity.  Being overweight.  Getting too much fat, sugar, calories, or salt in your diet.  Drinking too much alcohol. SIGNS AND SYMPTOMS Hypertension does not usually cause signs or symptoms. Extremely high blood pressure (hypertensive crisis) may cause headache, anxiety, shortness  of breath, and nosebleed. DIAGNOSIS  To check if you have hypertension, your health care provider will measure your blood pressure while you are seated, with your arm held at the level of your heart. It should be measured at least twice using the same arm. Certain conditions can cause a difference in blood pressure between your right and left arms. A blood pressure reading that is higher than normal on one occasion does not mean that you need treatment. If one blood pressure reading is high, ask your health care provider about having it checked again. TREATMENT  Treating high blood pressure includes making lifestyle changes and possibly taking medicine. Living a healthy lifestyle can help lower high blood pressure. You may need to change some of your habits. Lifestyle changes may include:  Following the DASH diet. This diet is high in fruits, vegetables, and whole grains. It is low in salt, red meat, and added sugars.  Getting at least 2 hours of brisk physical activity every week.  Losing weight if necessary.  Not smoking.  Limiting alcoholic beverages.  Learning ways to reduce stress. If lifestyle changes are not enough to get your blood pressure under control, your health care provider may prescribe medicine. You may need to take more than one. Work closely with your health care provider to  understand the risks and benefits. HOME CARE INSTRUCTIONS  Have your blood pressure rechecked as directed by your health care provider.   Take medicines only as directed by your health care provider. Follow the directions carefully. Blood pressure medicines must be taken as prescribed. The medicine does not work as well when you skip doses. Skipping doses also puts you at risk for problems.   Do not smoke.   Monitor your blood pressure at home as directed by your health care provider. SEEK MEDICAL CARE IF:   You think you are having a reaction to medicines taken.  You have recurrent  headaches or feel dizzy.  You have swelling in your ankles.  You have trouble with your vision. SEEK IMMEDIATE MEDICAL CARE IF:  You develop a severe headache or confusion.  You have unusual weakness, numbness, or feel faint.  You have severe chest or abdominal pain.  You vomit repeatedly.  You have trouble breathing. MAKE SURE YOU:   Understand these instructions.  Will watch your condition.  Will get help right away if you are not doing well or get worse. Document Released: 10/15/2005 Document Revised: 03/01/2014 Document Reviewed: 08/07/2013 [facility] Patient Information 2015 [place], [place]. This information is not intended to replace advice given to you by your health care provider. Make sure you discuss any questions you have with your health care provider.  Emergency Department Resource Guide 1) Find a Doctor and Pay Out of Pocket Although you won't have to find out who is covered by your insurance plan, it is a good idea to ask around and get recommendations. You will then need to call the office and see if the doctor you have chosen will accept you as a new patient and what types of options they offer for patients who are self-pay. Some doctors offer discounts or will set up payment plans for their patients who do not have insurance, but you will need to ask so you aren't surprised when you get to your appointment.  2) Contact Your Local Health Department Not all health departments have doctors that can see patients for sick visits, but many do, so it is worth a call to see if yours does. If you don't know where your local health department is, you can check in your phone book. The CDC also has a tool to help you locate your state's health department, and many state websites also have listings of all of their local health departments.  3) Find a [facility] If your illness is not likely to be very severe or complicated, you may want to try a walk in clinic. These are  popping up all over the country in pharmacies, drugstores, and shopping centers. They're usually staffed by nurse practitioners or physician assistants that have been trained to treat common illnesses and complaints. They're usually fairly quick and inexpensive. However, if you have serious medical issues or chronic medical problems, these are probably not your best option.  No Primary Care Doctor: - Call Health Connect at  [phone] - they can help you locate a primary care doctor that  accepts your insurance, provides certain services, etc. - Physician Referral Service- [phone]  Chronic Pain Problems: Organization         Address  Phone   Notes  [facility]  [phone] Patients need to be referred by their primary care doctor.   Medication Assistance: Organization         Address  Phone   Notes  [facility] Medication Assistance Program [address] [place], [place] [address] [phone] --Must be a resident of [facility] -- Must have NO insurance coverage whatsoever (no Medicaid/ Medicare, etc.) -- The pt. MUST have a primary care doctor that directs their care regularly and follows them in the community   Med[facility]ist  [phone]   [employer]  [phone]    Agencies that provide inexpensive medical care: Organization         Address  Phone   Notes  [place]  [phone]   [facility]    [phone]   [facility] [place], [place] [address] [phone]   [place] [address] [place] [phone]   Planned Parenthood    [phone]   [facility]    [phone]   Industry and [address], [place] Phone:  [phone], Fax:  [phone] Hours of Operation:  9 am - 6 pm, M-F.  Also accepts Medicaid/Medicare and self-pay.  [facility] [place] [address], [address], [place] Phone: [phone], Fax: [phone]. Hours of Operation:  8:30 am - 5:30 pm, M-F.  Also accepts Medicaid and self-pay.  [facility] [place] [address] [place] Phone: [phone]   [place], [place], [place] [phone], Ext. [phone] Mondays & Thursdays: 7-9 AM.  First 15 patients are seen on a first come, first serve basis.    [place] Providers:  Organization         Address  Phone   Notes  [facility] [address], [place] [phone] Also accepts self-pay patients.  [facility] [address], [place]  [phone]   [place], [address], [place] [phone]   [facility] [address] [place] [phone]   Lucianne Lei [address], [place]   [phone] Only accepts [place] Access [place] patients after they have their name applied to their card.   Self-Pay (no insurance) in [employer]:  Organization         Address  Phone   Notes  Sickle Cell Patients, [facility] [place] [phone]   [facility] [place] [phone]   [facility]  [place], [place], [place] [phone]   [facility]/Dr. Osei-Bonsu  [address], [place] or [address], [place] [phone] Phone number for both [place] and [place] locations is the same.  Urgent Medical and [facility] [address] Home [phone]   [employer] [address] [place] or [address] [phone]   [facility] [place] [address], [place] [phone], phone; [phone], fax Sees patients 1st and 3rd Saturday  of every month.  Must not qualify for public or private insurance (i.e. Medicaid, Medicare,  Health Choice, Veterans' Benefits)  Household income should be no more than 200% of the poverty level The clinic cannot treat you if you are pregnant or think you are pregnant  Sexually transmitted diseases are  not treated at the clinic.    Dental Care: Organization         Address  Phone  Notes  [facility] Department of [facility] [place] [phone] Accepts children up to age 53 who are enrolled in [place] or [place]; pregnant women with a Medicaid card; and children who have applied for Medicaid or [place] Health Choice, but were declined, whose parents can pay a reduced fee at time of service.  [facility] Department of [facility]  [address], [place] [phone] Accepts children up to age 71 who are enrolled in [place] or [place]; pregnant women with a Medicaid card; and children who have applied for Medicaid or [place] Health Choice, but were declined, whose parents can pay a reduced fee at time of service.  [facility] Adult Dental Access PROGRAM  [place] [phone] Patients are seen by appointment only. Walk-ins are not accepted. [place] will see patients 2 years of age and older. Monday - Tuesday (8am-5pm) Most Wednesdays (8:30-5pm) $30 per visit, cash only  [facility] Adult Dental Access PROGRAM  [address], [facility] [phone] Patients are seen by appointment only. Walk-ins are not accepted. [place] will see patients 45 years of age and older. One Wednesday Evening (Monthly: Volunteer Based).  $30 per visit, cash only  [place]  [phone] for adults; Children under age 43, call Graduate Pediatric Dentistry at [phone]. Children aged 66-14, please call [phone] to request a pediatric application.   Dental services are provided in all areas of dental care including fillings, crowns and bridges, complete and partial dentures, implants, gum treatment, root canals, and extractions. Preventive care is also provided. Treatment is provided to both adults and children. Patients are selected via a lottery and there is often a waiting list.   [facility] [address], [place]  [phone] [email or web address]   [facility] [address] [place], [place] [phone], Ext. [phone] Second and Fourth Thursday of each month, opens at 6:30 AM; Clinic ends at 9 AM.  Patients are seen on a first-come first-served basis, and a limited number are seen during each clinic.   [facility]  [address] [place], [place] [phone]   Eligibility Requirements You must have lived in [place], [place], or [place] counties for at least the last three months.   You cannot be eligible for state or federal sponsored [employer], including [employer], [place], or [employer].   You generally cannot be eligible for healthcare insurance through your employer.    How to apply: Eligibility screenings are held every Tuesday and Wednesday afternoon from 1:00 pm until 4:00 pm. You do not need an appointment for the interview!  [facility] [address], [place], [place]   [place]  [place] Department  [place]  [phone]    Behavioral Health Resources in the Community: Intensive Outpatient Programs Organization         Address  Phone  Notes  [place]. [address], [place], [place] [phone]   [facility] Outpatient [address] [place], [place]   ADS: Alcohol & Drug [facility] [address], [place], [place]   [place]  [place] [address]  [place], [place] or [phone]   Substance Abuse Resources Organization         Address  Phone  Notes  Alcohol and Drug Services  [phone]   [place]  [phone]   The [place]   Chinita Pester  [phone]   Residential & Outpatient Substance Abuse Program  [phone]   Psychological Services Organization         Address  Phone  Notes  [facility] [place]  [place]  [phone]   [place] [address] [place] or [phone]    Mobile Crisis Teams Organization         Address  Phone  Notes  [facility], Mobile Crisis Care Unit  [phone]   [facility]  [address] [place], [place]   [place] Levels [address], [place] [phone]    Self-Help/Support Groups Organization         Address  Phone             Notes  [place]. of [place] - variety of support groups  [place] Call for more information  Narcotics Anonymous (NA), [facility] [address], [employer] [place]  2 meetings at this location   Special educational needs teacher         Address  Phone  Notes  ASAP Residential Treatment [place],    [place]  [phone]   [facility]  [address] [place] [address]84166, [place], [place]   [place], [place] [phone] Admissions: 8am-3pm M-F  Incentives Substance [place] [address]    [place], [place] [phone]   [facility] [address] [place], [place], [place]   [facility] [address],  [place], [place]   [facility] - Intensive Outpatient [address], [place], [place]   [facility] ([place] [address]  [place], [place]  [phone] or [phone]   [facility] (RTS) [address], [place], [place] Accepts Medicaid  [facility] [place] [address],  [place] [phone] Substance Abuse/Addiction Treatment   [facility] Organization         Address  Phone  Notes  [facility]  [phone]   Domenic Schwab, PhD [address] Arlis Porta [place], [place]   [phone] or [phone]   [place], [place] [phone]   [facility] [address] [place], [place] [phone] Insurance/Medicaid/sponsorship through [facility] and Families [address] [place]                                    [place], [place] [phone] [place] [address], [place] [phone]    Dr. Adele Schilder  [phone]   [facility] Dept. 1) [address], [place] 2) [place] 3)  [place] [address], [place] [phone]  [phone]   [place] [phone] or [phone] (After Hours)

## 2015-09-24 ENCOUNTER — Emergency Department (HOSPITAL_COMMUNITY)
Admission: EM | Admit: 2015-09-24 | Discharge: 2015-09-25 | Disposition: A | Discharge status: Home / Self Care | Payer: BLUE CROSS/BLUE SHIELD | Attending: Emergency Medicine | Admitting: Emergency Medicine

## 2015-09-24 ENCOUNTER — Encounter (HOSPITAL_COMMUNITY): Payer: Self-pay | Admitting: Emergency Medicine

## 2015-09-24 DIAGNOSIS — R102 Pelvic and perineal pain: Secondary | ICD-10-CM

## 2015-09-24 DIAGNOSIS — N858 Other specified noninflammatory disorders of uterus: Secondary | ICD-10-CM | POA: Insufficient documentation

## 2015-09-24 DIAGNOSIS — N83202 Unspecified ovarian cyst, left side: Secondary | ICD-10-CM

## 2015-09-24 DIAGNOSIS — R1030 Lower abdominal pain, unspecified: Secondary | ICD-10-CM | POA: Diagnosis present

## 2015-09-24 DIAGNOSIS — Z87891 Personal history of nicotine dependence: Secondary | ICD-10-CM | POA: Insufficient documentation

## 2015-09-24 DIAGNOSIS — Z3202 Encounter for pregnancy test, result negative: Secondary | ICD-10-CM | POA: Diagnosis not present

## 2015-09-24 DIAGNOSIS — R1032 Left lower quadrant pain: Secondary | ICD-10-CM

## 2015-09-24 DIAGNOSIS — I10 Essential (primary) hypertension: Secondary | ICD-10-CM | POA: Insufficient documentation

## 2015-09-24 DIAGNOSIS — Z79899 Other long term (current) drug therapy: Secondary | ICD-10-CM | POA: Insufficient documentation

## 2015-09-24 LAB — COMPREHENSIVE METABOLIC PANEL
ALBUMIN: 3.8 g/dL (ref 3.5–5.0)
ALT: 17 U/L (ref 14–54)
ANION GAP: 7 (ref 5–15)
AST: 14 U/L — AB (ref 15–41)
Alkaline Phosphatase: 84 U/L (ref 38–126)
BUN: 13 mg/dL (ref 6–20)
CALCIUM: 8.9 mg/dL (ref 8.9–10.3)
CO2: 24 mmol/L (ref 22–32)
CREATININE: 0.63 mg/dL (ref 0.44–1.00)
Chloride: 106 mmol/L (ref 101–111)
GFR calc Af Amer: >60 mL/min (ref >60)
GFR calc non Af Amer: >60 mL/min (ref >60)
Glucose, Bld: 81 mg/dL (ref 65–99)
Potassium: 4.1 mmol/L (ref 3.5–5.1)
Sodium: 137 mmol/L (ref 135–145)
TOTAL PROTEIN: 7.6 g/dL (ref 6.5–8.1)
Total Bilirubin: 0.4 mg/dL (ref 0.3–1.2)

## 2015-09-24 LAB — CBC
HCT: 36.9 % (ref 36.0–46.0)
HEMOGLOBIN: 11.7 g/dL — AB (ref 12.0–15.0)
MCH: 21.4 pg — ABNORMAL LOW (ref 26.0–34.0)
MCHC: 31.7 g/dL (ref 30.0–36.0)
MCV: 67.3 fL — ABNORMAL LOW (ref 78.0–100.0)
Platelets: 352 10*3/uL (ref 150–400)
RBC: 5.48 MIL/uL — AB (ref 3.87–5.11)
RDW: 15.6 % — ABNORMAL HIGH (ref 11.5–15.5)
WBC: 10.9 10*3/uL — AB (ref 4.0–10.5)

## 2015-09-24 LAB — URINALYSIS, ROUTINE W REFLEX MICROSCOPIC
Bilirubin Urine: NEGATIVE
Glucose, UA: NEGATIVE mg/dL
Hgb urine dipstick: NEGATIVE
Ketones, ur: NEGATIVE mg/dL
Leukocytes, UA: NEGATIVE
NITRITE: NEGATIVE
Protein, ur: NEGATIVE mg/dL
SPECIFIC GRAVITY, URINE: 1.017 (ref 1.005–1.030)
pH: 5.5 (ref 5.0–8.0)

## 2015-09-24 LAB — I-STAT BETA HCG BLOOD, ED (MC, WL, AP ONLY)

## 2015-09-24 LAB — LIPASE, BLOOD: Lipase: 29 U/L (ref 11–51)

## 2015-09-24 MED ORDER — HYDROMORPHONE HCL 1 MG/ML IJ SOLN
1.0000 mg | Freq: Once | INTRAMUSCULAR | Status: AC
  Administered 2015-09-24: 1 mg via INTRAVENOUS
  Filled 2015-09-24: qty 1

## 2015-09-24 NOTE — ED Provider Notes (Signed)
CSN: [id number]     Arrival date & time 09/24/15  1940 History   First MD Initiated Contact with Patient 09/24/15 2222     Chief Complaint  Patient presents with  . Abdominal Pain  . Nausea     (Consider location/radiation/quality/duration/timing/severity/associated sxs/prior Treatment) HPI   Patient is a 39 year old female with past medical history of hypertension who presents the ED with complaint of lower abdominal pain, onset this morning. Patient reports when she woke up this morning she began having intermittent pressure to her lower abdomen, pain worse with movement or walking. She also states having pelvic pressure when urinating. She reports taking ibuprofen at home with no relief. Denies fever, chills, SOB, CP, N/V/D, urinary sxs, vaginal bleeding, vaginal d/c. LMP 11/7. Hx of abdominal surgeries include c-section.   Past Medical History  Diagnosis Date  . Hypertension    Past Surgical History  Procedure Laterality Date  . Cesarian     History reviewed. No pertinent family history. Social History  Substance Use Topics  . Smoking status: Former Smoker    Quit date: 07/15/2014  . Smokeless tobacco: None  . Alcohol Use: No   OB History    No data available     Review of Systems  Gastrointestinal: Positive for abdominal pain.  Genitourinary: Positive for pelvic pain.  All other systems reviewed and are negative.     Allergies  Percocet  Home Medications   Prior to Admission medications   Medication Sig Start Date End Date Taking? Authorizing Provider  ibuprofen (ADVIL,MOTRIN) 200 MG tablet Take 600 mg by mouth every 6 (six) hours as needed for headache.    Yes Historical Provider, MD  lisinopril (PRINIVIL,ZESTRIL) 20 MG tablet Take 1 tablet (20 mg total) by mouth daily. 04/23/15  Yes Fransico Meadow, PA-C  Multiple Vitamin (MULTIVITAMIN WITH MINERALS) TABS tablet Take 1 tablet by mouth daily.   Yes Historical Provider, MD   BP 160/107 mmHg  Pulse 94   Temp(Src) 98.2 F (36.8 C) (Oral)  Resp 18  SpO2 100%  LMP 09/05/2015 (Approximate) Physical Exam  Constitutional: She is oriented to person, place, and time. She appears well-developed and well-nourished.  HENT:  Head: Normocephalic and atraumatic.  Mouth/Throat: Oropharynx is clear and moist. No oropharyngeal exudate.  Eyes: Conjunctivae and EOM are normal. Right eye exhibits no discharge. Left eye exhibits no discharge. No scleral icterus.  Neck: Normal range of motion. Neck supple.  Cardiovascular: Normal rate, regular rhythm, normal heart sounds and intact distal pulses.   Pulmonary/Chest: Effort normal and breath sounds normal. No respiratory distress. She has no wheezes. She has no rales. She exhibits no tenderness.  Abdominal: Soft. Bowel sounds are normal. She exhibits no distension and no mass. There is tenderness in the right lower quadrant, suprapubic area and left lower quadrant. There is no rigidity, no rebound, no guarding, no CVA tenderness and negative Murphy's sign.  Musculoskeletal: She exhibits no edema.  Lymphadenopathy:    She has no cervical adenopathy.  Neurological: She is alert and oriented to person, place, and time.  Skin: Skin is warm and dry.  Nursing note and vitals reviewed.   ED Course  Procedures (including critical care time) Labs Review Labs Reviewed  WET PREP, GENITAL - Abnormal; Notable for the following:    WBC, Wet Prep HPF POC MANY (*)    All other components within normal limits  COMPREHENSIVE METABOLIC PANEL - Abnormal; Notable for the following:    AST 14 (*)  All other components within normal limits  CBC - Abnormal; Notable for the following:    WBC 10.9 (*)    RBC 5.48 (*)    Hemoglobin 11.7 (*)    MCV 67.3 (*)    MCH 21.4 (*)    RDW 15.6 (*)    All other components within normal limits  LIPASE, BLOOD  URINALYSIS, ROUTINE W REFLEX MICROSCOPIC (NOT AT [facility])  I-STAT BETA HCG BLOOD, ED ([facility], [facility], [facility] ONLY)  GC/CHLAMYDIA PROBE AMP  () NOT AT [facility]    Imaging Review No results found. I have personally reviewed and evaluated these images and lab results as part of my medical decision-making.  Pelvic exam: normal external genitalia, vulva, vagina, cervix, uterus and adnexa, VULVA: normal appearing vulva with no masses, tenderness or lesions, VAGINA: normal appearing vagina with normal color and discharge, no lesions, vaginal discharge - clear and mucoid, WET MOUNT done - results: white blood cells, DNA probe for chlamydia and GC obtained, CERVIX: normal appearing cervix without discharge or lesions, UTERUS: uterus is normal size, shape, consistency and nontender, ADNEXA: tenderness left, exam chaperoned by female nurse.   MDM   Final diagnoses:  LLQ pain  Left adnexal tenderness    Patient presents with lower abdominal pain that started this morning. Denies fever or any other complaints. VSS. Exam revealed mild lower abdominal tenderness, no peritoneal signs. Patient given pain meds and reports pain has mildly improved. Pelvic exam revealed  clear mucous discharge in vaginal vault and left adnexal tenderness. Pregnancy negative. Labs unremarkable. Wet prep revealed many WBCs. Transvaginal ultrasound ordered. Ultrasound revealed 2 small complex cyst measuring 1.8 cm, likely to represent small hemorrhagic cysts. I suspect patient's pain and presentation are likely due to ovarian cysts. Discussed results and plan for discharge with patient. Patient given gynecology follow-up.  Evaluation does not show pathology requring ongoing emergent intervention or admission. Pt is hemodynamically stable and mentating appropriately. Discussed findings/results and plan with patient/guardian, who agrees with plan. All questions answered. Return precautions discussed and outpatient follow up given.      Chesley Noon [place], [place] 09/25/15 0125  Varney Biles, MD 09/25/15 [phone]

## 2015-09-24 NOTE — ED Notes (Signed)
Pt c/o left lower abdominal pain/groin pain radiating to umbilicus. C/o pelvic pressure with urination. Denies burning/frequency. Denies fever/chills/diarrhea/emesis. Endorses nausea. Denies chance she could be pregnant. Denies abnormal bleeding. No other c/c.

## 2015-09-25 ENCOUNTER — Emergency Department (HOSPITAL_COMMUNITY): Payer: BLUE CROSS/BLUE SHIELD

## 2015-09-25 LAB — WET PREP, GENITAL
Clue Cells Wet Prep HPF POC: NONE SEEN
Sperm: NONE SEEN
TRICH WET PREP: NONE SEEN
YEAST WET PREP: NONE SEEN

## 2015-09-25 MED ORDER — IBUPROFEN 800 MG PO TABS: 800.0000 mg | ORAL_TABLET | Freq: Three times a day (TID) | ORAL | Status: DC

## 2015-09-25 MED ORDER — ONDANSETRON 4 MG PO TBDP: 4.0000 mg | ORAL_TABLET | Freq: Three times a day (TID) | ORAL | Status: DC | PRN

## 2015-09-25 NOTE — Discharge Instructions (Signed)
Take medications as prescribed. Follow-up with gynecology regarding your ovarian cyst. Please return to the Emergency Department if symptoms worsen or new onset of fever, vomiting, vaginal bleeding, vaginal discharge.

## 2015-09-26 LAB — GC/CHLAMYDIA PROBE AMP (~~LOC~~) NOT AT ARMC
Chlamydia: NEGATIVE
NEISSERIA GONORRHEA: NEGATIVE

## 2015-09-28 ENCOUNTER — Ambulatory Visit (INDEPENDENT_AMBULATORY_CARE_PROVIDER_SITE_OTHER): Payer: BLUE CROSS/BLUE SHIELD | Admitting: Certified Nurse Midwife

## 2015-09-28 ENCOUNTER — Encounter: Payer: Self-pay | Admitting: Certified Nurse Midwife

## 2015-09-28 VITALS — BP 129/93 | HR 99 | Temp 98.7°F | Wt 166.0 lb

## 2015-09-28 DIAGNOSIS — Z720 Tobacco use: Secondary | ICD-10-CM

## 2015-09-28 DIAGNOSIS — N83202 Unspecified ovarian cyst, left side: Secondary | ICD-10-CM

## 2015-09-28 DIAGNOSIS — N939 Abnormal uterine and vaginal bleeding, unspecified: Secondary | ICD-10-CM | POA: Diagnosis not present

## 2015-09-28 DIAGNOSIS — Z9851 Tubal ligation status: Secondary | ICD-10-CM

## 2015-09-28 NOTE — Progress Notes (Signed)
Patient ID: Brianna Gutierrez, female   DOB: [birth year], 39 y.o.   MRN: [id number]   Chief Complaint  Patient presents with  . Ovarian Cyst    Seen by ultrasound done by emergency room on 09-26-15    HPI Brianna Gutierrez is a 39 y.o. female.  Current everyday smoker, reports smoking 3 ciggs/day.  Does smell of heavy smoke.  Hx of HTN.  Menses: monthly, lasting 4-5 days stops and starts again for a few days lighter bleeding the second time with menorrhagia, soaking pads about every 2 hours for the first 2 days, reports large clots, does have cramping the first day and at the end of her cycle.  Menses pattern has been going on for six months.  Denies any hx of blood clots.   Hx of 1 c-section then VBAC with BTL.  G4P4.    Reviewed [place] results from 09/26/15.  Discussed AUB options including Mirena and uterine ablation.   Discussed tobacco cessation, declined Wellbutrin at this time.   HPI  Past Medical History  Diagnosis Date  . Hypertension     Past Surgical History  Procedure Laterality Date  . Cesarian      History reviewed. No pertinent family history.  Social History Social History  Substance Use Topics  . Smoking status: Former Smoker    Quit date: 07/15/2014  . Smokeless tobacco: Never Used  . Alcohol Use: No    Allergies  Allergen Reactions  . Percocet [Oxycodone-Acetaminophen] Hives    Current Outpatient Prescriptions  Medication Sig Dispense Refill  . ibuprofen (ADVIL,MOTRIN) 800 MG tablet Take 1 tablet (800 mg total) by mouth 3 (three) times daily. 21 tablet 0  . lisinopril (PRINIVIL,ZESTRIL) 20 MG tablet Take 1 tablet (20 mg total) by mouth daily. 30 tablet 2  . Multiple Vitamin (MULTIVITAMIN WITH MINERALS) TABS tablet Take 1 tablet by mouth daily.    . ondansetron (ZOFRAN ODT) 4 MG disintegrating tablet Take 1 tablet (4 mg total) by mouth every 8 (eight) hours as needed for nausea or vomiting. (Patient not taking: Reported on 09/28/2015) 10 tablet 0   No current  facility-administered medications for this visit.    Review of Systems Review of Systems Constitutional: negative for fatigue and weight loss Respiratory: negative for cough and wheezing Cardiovascular: negative for chest pain, fatigue and palpitations Gastrointestinal: negative for abdominal pain and change in bowel habits Genitourinary: + AUB Integument/breast: negative for nipple discharge Musculoskeletal:negative for myalgias Neurological: negative for gait problems and tremors Behavioral/Psych: negative for abusive relationship, depression Endocrine: negative for temperature intolerance     Blood pressure 129/93, pulse 99, temperature 98.7 F (37.1 C), weight 166 lb (75.297 kg), last menstrual period 09/05/2015.  Physical Exam Physical Exam General:   alert  Skin:   no rash or abnormalities  Lungs:   clear to auscultation bilaterally  Heart:   regular rate and rhythm, S1, S2 normal, no murmur, click, rub or gallop  Breasts:   normal without suspicious masses, skin or nipple changes or axillary nodes  Abdomen:  normal findings: no organomegaly, soft, non-tender and no hernia  Pelvis:  External genitalia: normal general appearance Urinary system: urethral meatus normal and bladder without fullness, nontender Vaginal: normal without tenderness, induration or masses Cervix: normal appearance Adnexa: normal bimanual exam Uterus: anteverted and non-tender, normal size    100% of 15 min visit spent on counseling and coordination of care.   Data Reviewed Previous medical hx, labs, meds, [place]  Assessment     AUB  H/O BTL Ovarian cyst  Tobacco Abuse    Plan    No orders of the defined types were placed in this encounter.   No orders of the defined types were placed in this encounter.    Possible management options include: uterine ablation, Mirena IUD, Wellbutrin for smoking cessation Follow up next week with period for Mirena IUD insertion.

## 2015-10-05 ENCOUNTER — Ambulatory Visit (INDEPENDENT_AMBULATORY_CARE_PROVIDER_SITE_OTHER): Payer: BLUE CROSS/BLUE SHIELD | Admitting: Certified Nurse Midwife

## 2015-10-05 VITALS — BP 133/91 | HR 88 | Wt 162.0 lb

## 2015-10-05 DIAGNOSIS — Z01818 Encounter for other preprocedural examination: Secondary | ICD-10-CM

## 2015-10-05 DIAGNOSIS — T839XXA Unspecified complication of genitourinary prosthetic device, implant and graft, initial encounter: Secondary | ICD-10-CM

## 2015-10-05 DIAGNOSIS — Z3201 Encounter for pregnancy test, result positive: Secondary | ICD-10-CM | POA: Diagnosis not present

## 2015-10-05 DIAGNOSIS — Z30430 Encounter for insertion of intrauterine contraceptive device: Secondary | ICD-10-CM | POA: Diagnosis not present

## 2015-10-06 ENCOUNTER — Telehealth: Payer: Self-pay | Admitting: *Deleted

## 2015-10-06 NOTE — Progress Notes (Signed)
Patient ID: Brianna Gutierrez, female   DOB: Jan 01, 1976, 39 y.o.   MRN: [id number]  IUD Procedure Note   DIAGNOSIS: Desires long-term, reversible contraception   PROCEDURE: IUD placement Performing Provider: Kandis Cocking CNM  Patient counseled prior to procedure. I explained risks and benefits of Mirena IUD, reviewed alternative forms of contraception. Patient stated understanding and consented to continue with procedure.   LMP: 09/02/15 Pregnancy Test: Negative  IUD type: [X]  Mirena   [   ] Paraguard  [   ] Isla Pence   [   ]  Kyleena  PROCEDURE:  Timeout procedure was performed to ensure right patient and right site.  A bimanual exam was performed to determine the position of the uterus, retroverted. The speculum was placed. The vagina and cervix was sterilized in the usual manner and sterile technique was maintained throughout the course of the procedure. A single toothed tenaculum was applied to the posterior lip of the cervix and gentle traction applied. Unable to sound uterous.  With gentle traction on the tenaculum, attempted to dilate the inner OS, unable to dilate the inner os.  Attempt to insert IUD aborted after multiple tries.  Patient desired to come back during her period for attempt at insertion again.  Patient tolerated the procedure well.     Follow up: Attempt IUD insertion when patient is on her cycle in a few weeks.  Kandis Cocking CNM

## 2015-10-06 NOTE — Telephone Encounter (Signed)
Patient request call- no reason left on voice mail

## 2015-10-07 NOTE — Telephone Encounter (Signed)
10-06-15 Patient contacted the office stating she was having some pain after her appointment on 10-05-15. Patient states she has been pretty uncomfortable since the failed IUD attempt. Patient states she has tried Ibuprofen but has has only a little relief. Patient asked what else she could do. Patient given comfort measures such as heating pad and warm bath to try and to continue the ibuprofen. Per Dr. Jodi Mourning if patient is still having pain in the morning will given prescription for pain medication.   10-07-15 Contacted patient to check on her pain. Patient states comfort measures have been helping. Still haivng some pain but not as bad as before. Patient declined any need for pain medication .

## 2015-10-10 ENCOUNTER — Telehealth: Payer: Self-pay | Admitting: *Deleted

## 2015-10-10 MED ORDER — IBUPROFEN 800 MG PO TABS: 800.0000 mg | ORAL_TABLET | Freq: Three times a day (TID) | ORAL | Status: DC

## 2015-10-10 NOTE — Telephone Encounter (Signed)
Patient is requesting refill of Ibuprofen for her IUD insertion.

## 2015-10-11 ENCOUNTER — Other Ambulatory Visit: Payer: Self-pay | Admitting: Certified Nurse Midwife

## 2015-10-11 MED ORDER — IBUPROFEN 800 MG PO TABS: 800.0000 mg | ORAL_TABLET | Freq: Three times a day (TID) | ORAL | Status: DC

## 2015-10-12 ENCOUNTER — Ambulatory Visit (INDEPENDENT_AMBULATORY_CARE_PROVIDER_SITE_OTHER): Payer: BLUE CROSS/BLUE SHIELD | Admitting: Certified Nurse Midwife

## 2015-10-12 ENCOUNTER — Encounter: Payer: Self-pay | Admitting: Certified Nurse Midwife

## 2015-10-12 VITALS — BP 138/100 | HR 81 | Temp 98.5°F | Wt 164.0 lb

## 2015-10-12 DIAGNOSIS — Z30430 Encounter for insertion of intrauterine contraceptive device: Secondary | ICD-10-CM | POA: Diagnosis not present

## 2015-10-12 DIAGNOSIS — Z01818 Encounter for other preprocedural examination: Secondary | ICD-10-CM

## 2015-10-12 DIAGNOSIS — Z30014 Encounter for initial prescription of intrauterine contraceptive device: Secondary | ICD-10-CM

## 2015-10-12 DIAGNOSIS — Z3202 Encounter for pregnancy test, result negative: Secondary | ICD-10-CM

## 2015-10-12 LAB — POCT URINE PREGNANCY
Preg Test, Ur: NEGATIVE
Preg Test, Ur: NEGATIVE

## 2015-10-12 MED ORDER — HYDROCODONE-ACETAMINOPHEN 5-325 MG PO TABS: 1.0000 | ORAL_TABLET | Freq: Four times a day (QID) | ORAL | Status: DC | PRN

## 2015-10-12 MED ORDER — CYCLOBENZAPRINE HCL 10 MG PO TABS: 10.0000 mg | ORAL_TABLET | Freq: Three times a day (TID) | ORAL | Status: DC | PRN

## 2015-10-12 MED ORDER — DIPHENHYDRAMINE HCL 25 MG PO TABS: 50.0000 mg | ORAL_TABLET | Freq: Every day | ORAL | Status: DC

## 2015-10-12 MED ORDER — TRAMADOL HCL 50 MG PO TABS: 50.0000 mg | ORAL_TABLET | Freq: Four times a day (QID) | ORAL | Status: DC | PRN

## 2015-10-12 NOTE — Progress Notes (Signed)
Patient ID: Brianna Gutierrez, female   DOB: [birth year], 39 y.o.   MRN: [id number]  IUD Procedure Note   DIAGNOSIS: Desires long-term, reversible contraception   PROCEDURE: IUD placement Performing Provider: Kandis Cocking CNM  Patient counseled prior to procedure. I explained risks and benefits of Mirena IUD, reviewed alternative forms of contraception.  Is using Mirena for AUB with hx of BTL.  Patient stated understanding and consented to continue with procedure.   LMP: N/A, on  period Pregnancy Test: Negative Lot #: [id number] Expiration Date:  06/19   IUD type: [X]  Mirena   [  ] Paraguard  [  ] Isla Pence   [  ]  Kyleena  PROCEDURE:  Timeout procedure was performed to ensure right patient and right site.  A bimanual exam was performed to determine the position of the uterus, anteverted. The speculum was placed. The vagina and cervix was sterilized in the usual manner and sterile technique was maintained throughout the course of the procedure. A single toothed tenaculum was applied to the posterior lip of the cervix and gentle traction applied. Cervical dilators used small and then medium.  The depth of the uterus was sounded to 9 cm. With gentle traction on the tenaculum, the IUD was inserted to the appropriate depth and inserted without difficulty.  The string was cut to an estimated 4 cm length. Bleeding was minimal. The patient tolerated the procedure well.   Follow up: The patient tolerated the procedure well without complications.  Standard post-procedure care is explained and return precautions are given.  Kandis Cocking CNM

## 2015-11-09 ENCOUNTER — Ambulatory Visit: Payer: BLUE CROSS/BLUE SHIELD | Admitting: Certified Nurse Midwife

## 2015-11-10 ENCOUNTER — Ambulatory Visit (INDEPENDENT_AMBULATORY_CARE_PROVIDER_SITE_OTHER): Payer: BLUE CROSS/BLUE SHIELD | Admitting: Certified Nurse Midwife

## 2015-11-10 ENCOUNTER — Encounter: Payer: Self-pay | Admitting: Certified Nurse Midwife

## 2015-11-10 VITALS — BP 143/100 | HR 77 | Temp 98.2°F | Wt 162.0 lb

## 2015-11-10 DIAGNOSIS — IMO0001 Reserved for inherently not codable concepts without codable children: Secondary | ICD-10-CM

## 2015-11-10 DIAGNOSIS — R030 Elevated blood-pressure reading, without diagnosis of hypertension: Secondary | ICD-10-CM

## 2015-11-10 MED ORDER — PROPRANOLOL-HCTZ 80-25 MG PO TABS: 1.0000 | ORAL_TABLET | Freq: Every day | ORAL | Status: DC

## 2015-11-10 NOTE — Progress Notes (Signed)
Patient ID: Brianna Gutierrez, female   DOB: 1976/04/28, 40 y.o.   MRN: [id number]  Chief Complaint  Patient presents with  . Follow-up    IUD Check    HPI Brianna Gutierrez is a 40 y.o. female.  Here for IUD check.  Had brown spotting for a few days after insertion.  Is currently having a period.  States that it is improved since prior to insertion of the IUD.  Denies any menorrhagia or dysmenorrhea.  Encouragement given.  Patient is happy with her IUD.  Elevated blood pressures, hx of 140's over 100's.  Education given.  Patient states that she does not have a primary care provider.    HPI  Past Medical History  Diagnosis Date  . Hypertension     Past Surgical History  Procedure Laterality Date  . Cesarian      History reviewed. No pertinent family history.  Social History Social History  Substance Use Topics  . Smoking status: Former Smoker    Quit date: 07/15/2014  . Smokeless tobacco: Never Used  . Alcohol Use: No    Allergies  Allergen Reactions  . Percocet [Oxycodone-Acetaminophen] Hives    Current Outpatient Prescriptions  Medication Sig Dispense Refill  . HYDROcodone-acetaminophen (LORTAB) 5-325 MG tablet Take 1-2 tablets by mouth every 6 (six) hours as needed for moderate pain. 25 tablet 0  . ibuprofen (ADVIL,MOTRIN) 800 MG tablet Take 1 tablet (800 mg total) by mouth 3 (three) times daily. 90 tablet 4  . lisinopril (PRINIVIL,ZESTRIL) 20 MG tablet Take 1 tablet (20 mg total) by mouth daily. 30 tablet 2  . Multiple Vitamin (MULTIVITAMIN WITH MINERALS) TABS tablet Take 1 tablet by mouth daily.    . propranolol-hydrochlorothiazide (INDERIDE) 80-25 MG tablet Take 1 tablet by mouth daily. 30 tablet 12   No current facility-administered medications for this visit.    Review of Systems Review of Systems Constitutional: negative for fatigue and weight loss Respiratory: negative for cough and wheezing Cardiovascular: negative for chest pain, fatigue and  palpitations Gastrointestinal: negative for abdominal pain and change in bowel habits Genitourinary:negative Integument/breast: negative for nipple discharge Musculoskeletal:negative for myalgias Neurological: negative for gait problems and tremors Behavioral/Psych: negative for abusive relationship, depression Endocrine: negative for temperature intolerance     Blood pressure 143/100, pulse 77, temperature 98.2 F (36.8 C), weight 162 lb (73.483 kg), last menstrual period 10/08/2015.  Physical Exam Physical Exam General:   alert  Skin:   no rash or abnormalities  Lungs:   clear to auscultation bilaterally  Heart:   regular rate and rhythm, S1, S2 normal, no murmur, click, rub or gallop  Breasts:   normal without suspicious masses, skin or nipple changes or axillary nodes  Abdomen:  normal findings: no organomegaly, soft, non-tender and no hernia  Pelvis:  External genitalia: normal general appearance Urinary system: urethral meatus normal and bladder without fullness, nontender Vaginal: normal without tenderness, induration or masses Cervix: normal appearance, + IUD strings visualized Adnexa: normal bimanual exam Uterus: anteverted and non-tender, normal size    50% of 15 min visit spent on counseling and coordination of care.   Data Reviewed Previous medical hx, meds  Assessment     Hypertension IUD check up: in place     Plan    Orders Placed This Encounter  Procedures  . Ambulatory referral to Internal Medicine    Referral Priority:  Urgent    Referral Type:  Consultation    Referral Reason:  Specialty Services Required  Requested Specialty:  Internal Medicine    Number of Visits Requested:  1   Meds ordered this encounter  Medications  . propranolol-hydrochlorothiazide (INDERIDE) 80-25 MG tablet    Sig: Take 1 tablet by mouth daily.    Dispense:  30 tablet    Refill:  12    Follow up as needed or in 1 year.

## 2015-11-22 ENCOUNTER — Encounter (HOSPITAL_COMMUNITY): Payer: Self-pay | Admitting: Emergency Medicine

## 2015-11-22 ENCOUNTER — Ambulatory Visit (INDEPENDENT_AMBULATORY_CARE_PROVIDER_SITE_OTHER): Payer: BLUE CROSS/BLUE SHIELD | Admitting: Family Medicine

## 2015-11-22 ENCOUNTER — Ambulatory Visit (INDEPENDENT_AMBULATORY_CARE_PROVIDER_SITE_OTHER): Payer: BLUE CROSS/BLUE SHIELD

## 2015-11-22 ENCOUNTER — Emergency Department (HOSPITAL_COMMUNITY)
Admission: EM | Admit: 2015-11-22 | Discharge: 2015-11-22 | Discharge status: Against Medical Advice | Payer: BLUE CROSS/BLUE SHIELD | Attending: Emergency Medicine | Admitting: Emergency Medicine

## 2015-11-22 VITALS — BP 120/80 | HR 99 | Temp 98.1°F | Resp 20 | Ht 62.6 in | Wt 158.6 lb

## 2015-11-22 DIAGNOSIS — R1032 Left lower quadrant pain: Secondary | ICD-10-CM

## 2015-11-22 DIAGNOSIS — R109 Unspecified abdominal pain: Secondary | ICD-10-CM | POA: Insufficient documentation

## 2015-11-22 DIAGNOSIS — R319 Hematuria, unspecified: Secondary | ICD-10-CM | POA: Diagnosis not present

## 2015-11-22 DIAGNOSIS — R1030 Lower abdominal pain, unspecified: Secondary | ICD-10-CM

## 2015-11-22 DIAGNOSIS — I10 Essential (primary) hypertension: Secondary | ICD-10-CM | POA: Diagnosis not present

## 2015-11-22 DIAGNOSIS — Z3202 Encounter for pregnancy test, result negative: Secondary | ICD-10-CM | POA: Diagnosis not present

## 2015-11-22 LAB — POCT CBC
GRANULOCYTE PERCENT: 58.1 % (ref 37–80)
HCT, POC: 38.6 % (ref 37.7–47.9)
Hemoglobin: 12.4 g/dL (ref 12.2–16.2)
Lymph, poc: 2.4 (ref 0.6–3.4)
MCH: 21.1 pg — AB (ref 27–31.2)
MCHC: 32.2 g/dL (ref 31.8–35.4)
MCV: 65.4 fL — AB (ref 80–97)
MID (cbc): 0.2 (ref 0–0.9)
MPV: 7.5 fL (ref 0–99.8)
POC Granulocyte: 3.6 (ref 2–6.9)
POC LYMPH %: 38.6 % (ref 10–50)
POC MID %: 3.3 %M (ref 0–12)
Platelet Count, POC: 391 10*3/uL (ref 142–424)
RBC: 5.91 M/uL — AB (ref 4.04–5.48)
RDW, POC: 17.3 %
WBC: 6.2 10*3/uL (ref 4.6–10.2)

## 2015-11-22 LAB — POCT URINALYSIS DIP (MANUAL ENTRY)
BILIRUBIN UA: NEGATIVE
GLUCOSE UA: NEGATIVE
Ketones, POC UA: NEGATIVE
Leukocytes, UA: NEGATIVE
NITRITE UA: NEGATIVE
Protein Ur, POC: 100 — AB
Spec Grav, UA: 1.025
Urobilinogen, UA: 1
pH, UA: 6

## 2015-11-22 LAB — COMPLETE METABOLIC PANEL WITH GFR
ALBUMIN: 3.9 g/dL (ref 3.6–5.1)
ALT: 16 U/L (ref 6–29)
AST: 13 U/L (ref 10–30)
Alkaline Phosphatase: 81 U/L (ref 33–115)
BILIRUBIN TOTAL: 0.4 mg/dL (ref 0.2–1.2)
BUN: 13 mg/dL (ref 7–25)
CHLORIDE: 102 mmol/L (ref 98–110)
CO2: 29 mmol/L (ref 20–31)
Calcium: 9.4 mg/dL (ref 8.6–10.2)
Creat: 0.62 mg/dL (ref 0.50–1.10)
GFR, Est African American: >89 mL/min (ref >=60)
GLUCOSE: 131 mg/dL — AB (ref 65–99)
Potassium: 3.7 mmol/L (ref 3.5–5.3)
SODIUM: 138 mmol/L (ref 135–146)
Total Protein: 7.4 g/dL (ref 6.1–8.1)

## 2015-11-22 LAB — URINALYSIS, ROUTINE W REFLEX MICROSCOPIC
BILIRUBIN URINE: NEGATIVE
Glucose, UA: NEGATIVE mg/dL
HGB URINE DIPSTICK: NEGATIVE
KETONES UR: NEGATIVE mg/dL
Leukocytes, UA: NEGATIVE
NITRITE: NEGATIVE
Protein, ur: 30 mg/dL — AB
Specific Gravity, Urine: 1.026 (ref 1.005–1.030)
pH: 6 (ref 5.0–8.0)

## 2015-11-22 LAB — URINE MICROSCOPIC-ADD ON

## 2015-11-22 LAB — CBC
HCT: 39.5 % (ref 36.0–46.0)
Hemoglobin: 12.6 g/dL (ref 12.0–15.0)
MCH: 21.2 pg — AB (ref 26.0–34.0)
MCHC: 31.9 g/dL (ref 30.0–36.0)
MCV: 66.6 fL — ABNORMAL LOW (ref 78.0–100.0)
PLATELETS: 414 10*3/uL — AB (ref 150–400)
RBC: 5.93 MIL/uL — ABNORMAL HIGH (ref 3.87–5.11)
RDW: 17.2 % — AB (ref 11.5–15.5)
WBC: 5.8 10*3/uL (ref 4.0–10.5)

## 2015-11-22 LAB — COMPREHENSIVE METABOLIC PANEL
ALBUMIN: 3.4 g/dL — AB (ref 3.5–5.0)
ALK PHOS: 84 U/L (ref 38–126)
ALT: 21 U/L (ref 14–54)
AST: 18 U/L (ref 15–41)
Anion gap: 10 (ref 5–15)
BILIRUBIN TOTAL: 0.5 mg/dL (ref 0.3–1.2)
BUN: 13 mg/dL (ref 6–20)
CALCIUM: 9.1 mg/dL (ref 8.9–10.3)
CO2: 26 mmol/L (ref 22–32)
CREATININE: 0.69 mg/dL (ref 0.44–1.00)
Chloride: 104 mmol/L (ref 101–111)
GFR calc Af Amer: >60 mL/min (ref >60)
GFR calc non Af Amer: >60 mL/min (ref >60)
GLUCOSE: 123 mg/dL — AB (ref 65–99)
Potassium: 3.5 mmol/L (ref 3.5–5.1)
SODIUM: 140 mmol/L (ref 135–145)
TOTAL PROTEIN: 7.2 g/dL (ref 6.5–8.1)

## 2015-11-22 LAB — POC MICROSCOPIC URINALYSIS (UMFC)

## 2015-11-22 LAB — LIPASE: Lipase: 17 U/L (ref 7–60)

## 2015-11-22 LAB — POC URINE PREG, ED: PREG TEST UR: NEGATIVE

## 2015-11-22 LAB — LIPASE, BLOOD: Lipase: 24 U/L (ref 11–51)

## 2015-11-22 NOTE — ED Notes (Signed)
Pt came to nurse first to advise she is leaving.  Advised to return if symptoms worsens or persist.

## 2015-11-22 NOTE — Progress Notes (Addendum)
Subjective:  This chart was scribed for Merri Ray MD, by Tamsen Roers, at [facility].  This patient was seen in room  5  and the patient's care was started at 3:08 PM.    Patient ID: Brianna Gutierrez, female    DOB: Mar 12, 1976, 40 y.o.   MRN: [id number] Chief Complaint  Patient presents with  . Abdominal Pain    tenderness , lower side area     HPI  HPI Comments: Brianna Gutierrez is a 40 y.o. female who presents to the [facility] complaining of abdominal pain 1.5 months ago.  When her abdominal pain first started, patient went to [facility] ED and was told that she had a complex cyst in her left ovary as well as a small uterine fibroid.  This was confirmed via Transvaginal Ultrasound.  There were no right sided ovarian cysts or masses noted.  Patient went to [facility] and had a [place] UID placed. She was told that it was difficult to put the [facility] in due to her cervix. Patient notes that she is still having a significant amount of abdominal pressure when passing gas and feels discomfort in her vaginal area.  Patient is having bowel movements daily but does feel like she has to strain more at times.  Her abdominal pain eases when she passes gas.  She has taken Dulcolax (1 time) but denies any significant relief. She has followed up with her OBGYN and didn't see anything of concern so she was told to see her PCP.    Her periods have been lighter at times and more irregular after her IUD.  Patient has been pregnant 4 times, has 4 children, and has had a tubal ligation.   Denies diarrhea, fever/chills, denies any vaginal bleeding, loss of appetite, nausea/vomitting, difficulty with urination, hematuria.  Patient is lactose intolerant.     Patient Active Problem List   Diagnosis Date Noted  . Abnormal uterine bleeding (AUB) 09/28/2015  . History of bilateral tubal ligation 09/28/2015  . Tobacco abuse 09/28/2015   Past Medical History    Diagnosis Date  . Hypertension    Past Surgical History  Procedure Laterality Date  . Cesarian    . Cesarean section    . Tubal ligation     Allergies  Allergen Reactions  . Percocet [Oxycodone-Acetaminophen] Hives   Prior to Admission medications   Medication Sig Start Date End Date Taking? Authorizing Provider  ibuprofen (ADVIL,MOTRIN) 800 MG tablet Take 1 tablet (800 mg total) by mouth 3 (three) times daily. 10/11/15  Yes Rachelle A Denney, CNM  lisinopril (PRINIVIL,ZESTRIL) 20 MG tablet Take 1 tablet (20 mg total) by mouth daily. 04/23/15  Yes Fransico Meadow, PA-C  Multiple Vitamin (MULTIVITAMIN WITH MINERALS) TABS tablet Take 1 tablet by mouth daily.   Yes Historical Provider, MD  propranolol-hydrochlorothiazide (INDERIDE) 80-25 MG tablet Take 1 tablet by mouth daily. 11/10/15  Yes Rachelle A Denney, CNM  HYDROcodone-acetaminophen (LORTAB) 5-325 MG tablet Take 1-2 tablets by mouth every 6 (six) hours as needed for moderate pain. Patient not taking: Reported on 11/22/2015 10/12/15   Morene Crocker, CNM   Social History   Social History  . Marital Status: Single    Spouse Name: N/A  . Number of Children: N/A  . Years of Education: N/A   Occupational History  . Not on file.   Social History Main Topics  . Smoking status: Former Audiological scientist  date: 07/15/2014  . Smokeless tobacco: Never Used  . Alcohol Use: No  . Drug Use: No  . Sexual Activity:    Partners: Male    Birth Control/ Protection: Surgical   Other Topics Concern  . Not on file   Social History Narrative    Review of Systems  Constitutional: Negative for fever and chills.  Eyes: Negative for pain, redness and itching.  Respiratory: Negative for cough, choking and shortness of breath.   Gastrointestinal: Positive for abdominal pain. Negative for nausea, vomiting and diarrhea.  Musculoskeletal: Negative for neck pain and neck stiffness.       Objective:   Physical Exam  Constitutional: She is  oriented to person, place, and time. She appears well-developed and well-nourished.  HENT:  Head: Normocephalic and atraumatic.  Eyes: Pupils are equal, round, and reactive to light.  Cardiovascular: Normal rate, regular rhythm and normal heart sounds.  Exam reveals no gallop and no friction rub.   No murmur heard. Pulmonary/Chest: Effort normal and breath sounds normal. No respiratory distress. She has no wheezes. She has no rales.  Abdominal: There is tenderness. There is no CVA tenderness, no tenderness at McBurney's point and negative Murphy's sign.  Slight tenderness left lower quadrant.   Neurological: She is alert and oriented to person, place, and time.  Skin: Skin is warm and dry.   Filed Vitals:   11/22/15 1348  BP: 120/80  Pulse: 99  Temp: 98.1 F (36.7 C)  TempSrc: Oral  Resp: 20  Height: 5' 2.6" (1.59 m)  Weight: 158 lb 9.6 oz (71.94 kg)  SpO2: 98%   Results for orders placed or performed in visit on 11/22/15  POCT CBC  Result Value Ref Range   WBC 6.2 4.6 - 10.2 K/uL   Lymph, poc 2.4 0.6 - 3.4   POC LYMPH PERCENT 38.6 10 - 50 %L   MID (cbc) 0.2 0 - 0.9   POC MID % 3.3 0 - 12 %M   POC Granulocyte 3.6 2 - 6.9   Granulocyte percent 58.1 37 - 80 %G   RBC 5.91 (A) 4.04 - 5.48 M/uL   Hemoglobin 12.4 12.2 - 16.2 g/dL   HCT, POC 38.6 37.7 - 47.9 %   MCV 65.4 (A) 80 - 97 fL   MCH, POC 21.1 (A) 27 - 31.2 pg   MCHC 32.2 31.8 - 35.4 g/dL   RDW, POC 17.3 %   Platelet Count, POC 391 142 - 424 K/uL   MPV 7.5 0 - 99.8 fL  POCT urinalysis dipstick  Result Value Ref Range   Color, UA yellow yellow   Clarity, UA hazy (A) clear   Glucose, UA negative negative   Bilirubin, UA negative negative   Ketones, POC UA negative negative   Spec Grav, UA 1.025    Blood, UA trace-intact (A) negative   pH, UA 6.0    Protein Ur, POC =100 (A) negative   Urobilinogen, UA 1.0    Nitrite, UA Negative Negative   Leukocytes, UA Negative Negative  POCT Microscopic Urinalysis ([facility])    Result Value Ref Range   WBC,UR,HPF,POC Few (A) None WBC/hpf   RBC,UR,HPF,POC None None RBC/hpf   Bacteria Few (A) None, Too numerous to count   Mucus Present (A) Absent   Epithelial Cells, UR Per Microscopy Moderate (A) None, Too numerous to count cells/hpf       Assessment & Plan:  Brianna Gutierrez is a 40 y.o. female LLQ abdominal pain - Plan: POCT  CBC, COMPLETE METABOLIC PANEL WITH GFR, POCT urinalysis dipstick, POCT Microscopic Urinalysis ([facility]), Lipase, DG Abd 1 View, CT Abdomen Pelvis W Contrast  Suprapubic abdominal pain, unspecified laterality - Plan: POCT CBC, COMPLETE METABOLIC PANEL WITH GFR, POCT urinalysis dipstick, POCT Microscopic Urinalysis ([facility]), Lipase, DG Abd 1 View, CT Abdomen Pelvis W Contrast  Hematuria - Plan: Urine culture  Outpatient CT of abdomen and pelvis. Reassuring CBC in office. check lipase, CMP, maintain fluid intake, possible MiraLAX if constipated. Depending on CT results, GI eval may be needed. RTC/ER precautions.  No orders of the defined types were placed in this encounter.   Patient Instructions   Because you received an x-ray today, you will receive an invoice from [facility]. Please contact [facility] at [phone] with questions or concerns regarding your invoice. Our billing staff will not be able to assist you with those questions.  We will call you about cat scan, and other labs. Depending on cat scan results, may need to see gastroenterologist.   If constipated - can try miralax over the counter. Make sure to drink plenty of fluids.   Return to the clinic or go to the nearest emergency room if any of your symptoms worsen or new symptoms occur.  Abdominal Pain, Adult Many things can cause abdominal pain. Usually, abdominal pain is not caused by a disease and will improve without treatment. It can often be observed and treated at home. Your health care provider will do a physical exam and possibly order blood tests and  X-rays to help determine the seriousness of your pain. However, in many cases, more time must pass before a clear cause of the pain can be found. Before that point, your health care provider may not know if you need more testing or further treatment. HOME CARE INSTRUCTIONS Monitor your abdominal pain for any changes. The following actions may help to alleviate any discomfort you are experiencing:  Only take over-the-counter or prescription medicines as directed by your health care provider.  Do not take laxatives unless directed to do so by your health care provider.  Try a clear liquid diet (broth, tea, or water) as directed by your health care provider. Slowly move to a bland diet as tolerated. SEEK MEDICAL CARE IF:  You have unexplained abdominal pain.  You have abdominal pain associated with nausea or diarrhea.  You have pain when you urinate or have a bowel movement.  You experience abdominal pain that wakes you in the night.  You have abdominal pain that is worsened or improved by eating food.  You have abdominal pain that is worsened with eating fatty foods.  You have a fever. SEEK IMMEDIATE MEDICAL CARE IF:  Your pain does not go away within 2 hours.  You keep throwing up (vomiting).  Your pain is felt only in portions of the abdomen, such as the right side or the left lower portion of the abdomen.  You pass bloody or black tarry stools. MAKE SURE YOU:  Understand these instructions.  Will watch your condition.  Will get help right away if you are not doing well or get worse.   This information is not intended to replace advice given to you by your health care provider. Make sure you discuss any questions you have with your health care provider.   Document Released: 07/25/2005 Document Revised: 07/06/2015 Document Reviewed: 06/24/2013 Elsevier Interactive Patient Education [employer].       I personally performed the services described in this  documentation, which was scribed in my presence. The recorded information has been reviewed and considered, and addended by me as needed.

## 2015-11-22 NOTE — ED Notes (Signed)
C/o abd pain X 1 month, no V/D/F, non urinary symptoms, A/O X4, ambulatory and in NAD

## 2015-11-22 NOTE — Patient Instructions (Addendum)
Because you received an x-ray today, you will receive an invoice from [facility]. Please contact [facility]iology at [phone] with questions or concerns regarding your invoice. Our billing staff will not be able to assist you with those questions.  We will call you about cat scan, and other labs. Depending on cat scan results, may need to see gastroenterologist.   If constipated - can try miralax over the counter. Make sure to drink plenty of fluids.   Return to the clinic or go to the nearest emergency room if any of your symptoms worsen or new symptoms occur.  Abdominal Pain, Adult Many things can cause abdominal pain. Usually, abdominal pain is not caused by a disease and will improve without treatment. It can often be observed and treated at home. Your health care provider will do a physical exam and possibly order blood tests and X-rays to help determine the seriousness of your pain. However, in many cases, more time must pass before a clear cause of the pain can be found. Before that point, your health care provider may not know if you need more testing or further treatment. HOME CARE INSTRUCTIONS Monitor your abdominal pain for any changes. The following actions may help to alleviate any discomfort you are experiencing:  Only take over-the-counter or prescription medicines as directed by your health care provider.  Do not take laxatives unless directed to do so by your health care provider.  Try a clear liquid diet (broth, tea, or water) as directed by your health care provider. Slowly move to a bland diet as tolerated. SEEK MEDICAL CARE IF:  You have unexplained abdominal pain.  You have abdominal pain associated with nausea or diarrhea.  You have pain when you urinate or have a bowel movement.  You experience abdominal pain that wakes you in the night.  You have abdominal pain that is worsened or improved by eating food.  You have abdominal pain that is  worsened with eating fatty foods.  You have a fever. SEEK IMMEDIATE MEDICAL CARE IF:  Your pain does not go away within 2 hours.  You keep throwing up (vomiting).  Your pain is felt only in portions of the abdomen, such as the right side or the left lower portion of the abdomen.  You pass bloody or black tarry stools. MAKE SURE YOU:  Understand these instructions.  Will watch your condition.  Will get help right away if you are not doing well or get worse.   This information is not intended to replace advice given to you by your health care provider. Make sure you discuss any questions you have with your health care provider.   Document Released: 07/25/2005 Document Revised: 07/06/2015 Document Reviewed: 06/24/2013 Elsevier Interactive Patient Education [employer].

## 2015-11-24 LAB — URINE CULTURE

## 2015-12-16 ENCOUNTER — Other Ambulatory Visit: Payer: Self-pay | Admitting: Family Medicine

## 2015-12-19 ENCOUNTER — Inpatient Hospital Stay: Admission: RE | Admit: 2015-12-19 | Payer: BLUE CROSS/BLUE SHIELD | Source: Ambulatory Visit

## 2016-03-27 ENCOUNTER — Ambulatory Visit: Payer: Self-pay | Admitting: Certified Nurse Midwife

## 2016-04-26 ENCOUNTER — Inpatient Hospital Stay (HOSPITAL_COMMUNITY): Payer: BLUE CROSS/BLUE SHIELD

## 2016-04-26 ENCOUNTER — Encounter (HOSPITAL_COMMUNITY): Payer: Self-pay | Admitting: *Deleted

## 2016-04-26 ENCOUNTER — Inpatient Hospital Stay (HOSPITAL_COMMUNITY)
Admission: AD | Admit: 2016-04-26 | Discharge: 2016-04-27 | Disposition: A | Discharge status: Home / Self Care | Payer: BLUE CROSS/BLUE SHIELD | Source: Ambulatory Visit | Attending: Obstetrics | Admitting: Obstetrics

## 2016-04-26 DIAGNOSIS — Z885 Allergy status to narcotic agent status: Secondary | ICD-10-CM | POA: Insufficient documentation

## 2016-04-26 DIAGNOSIS — Y762 Prosthetic and other implants, materials and accessory obstetric and gynecological devices associated with adverse incidents: Secondary | ICD-10-CM | POA: Diagnosis not present

## 2016-04-26 DIAGNOSIS — T8389XA Other specified complication of genitourinary prosthetic devices, implants and grafts, initial encounter: Secondary | ICD-10-CM | POA: Diagnosis not present

## 2016-04-26 DIAGNOSIS — N939 Abnormal uterine and vaginal bleeding, unspecified: Secondary | ICD-10-CM | POA: Insufficient documentation

## 2016-04-26 DIAGNOSIS — D259 Leiomyoma of uterus, unspecified: Secondary | ICD-10-CM | POA: Insufficient documentation

## 2016-04-26 DIAGNOSIS — Z87891 Personal history of nicotine dependence: Secondary | ICD-10-CM | POA: Insufficient documentation

## 2016-04-26 DIAGNOSIS — Z975 Presence of (intrauterine) contraceptive device: Secondary | ICD-10-CM

## 2016-04-26 DIAGNOSIS — T8332XA Displacement of intrauterine contraceptive device, initial encounter: Secondary | ICD-10-CM

## 2016-04-26 LAB — URINALYSIS, ROUTINE W REFLEX MICROSCOPIC
BILIRUBIN URINE: NEGATIVE
Glucose, UA: NEGATIVE mg/dL
Ketones, ur: NEGATIVE mg/dL
NITRITE: NEGATIVE
Protein, ur: >300 mg/dL — AB
SPECIFIC GRAVITY, URINE: 1.025 (ref 1.005–1.030)
pH: 6.5 (ref 5.0–8.0)

## 2016-04-26 LAB — CBC
HEMATOCRIT: 37.9 % (ref 36.0–46.0)
HEMOGLOBIN: 12.5 g/dL (ref 12.0–15.0)
MCH: 23.2 pg — ABNORMAL LOW (ref 26.0–34.0)
MCHC: 33 g/dL (ref 30.0–36.0)
MCV: 70.3 fL — ABNORMAL LOW (ref 78.0–100.0)
Platelets: 310 10*3/uL (ref 150–400)
RBC: 5.39 MIL/uL — AB (ref 3.87–5.11)
RDW: 15.2 % (ref 11.5–15.5)
WBC: 5.9 10*3/uL (ref 4.0–10.5)

## 2016-04-26 LAB — WET PREP, GENITAL
Clue Cells Wet Prep HPF POC: NONE SEEN
SPERM: NONE SEEN
TRICH WET PREP: NONE SEEN
YEAST WET PREP: NONE SEEN

## 2016-04-26 LAB — POCT PREGNANCY, URINE: PREG TEST UR: NEGATIVE

## 2016-04-26 LAB — URINE MICROSCOPIC-ADD ON: Squamous Epithelial / LPF: NONE SEEN

## 2016-04-26 MED ORDER — KETOROLAC TROMETHAMINE 60 MG/2ML IM SOLN
60.0000 mg | Freq: Once | INTRAMUSCULAR | Status: AC
  Administered 2016-04-26: 60 mg via INTRAMUSCULAR
  Filled 2016-04-26: qty 2

## 2016-04-26 NOTE — MAU Note (Signed)
Pt states that she had a Mirena placed in February. Pt states that she has had spotting, but today before work she felt nauseous and sick and went to the bathroom and had heavy vaginal bleeding and passing clots. States they were about golf-ball sized. Has lower abdominal cramping-rates 8/10. Tried ibuprofen around 6pm-helped some.

## 2016-04-26 NOTE — MAU Provider Note (Signed)
History     CSN: [id number]  Arrival date and time: 04/26/16 2205   First Provider Initiated Contact with Patient 04/26/16 2316      Chief Complaint  Patient presents with  . Vaginal Bleeding   HPI Comments: Brianna Gutierrez is a 40 y.o G4P0 who presents today vaginal bleeding. She had IUD placed in February. Has had some irregular bleeding since.   Vaginal Bleeding The patient's primary symptoms include pelvic pain and vaginal bleeding. This is a new problem. The current episode started in the past 7 days. The problem has been gradually worsening. Pain severity now: 6/10  The problem affects both sides. She is not pregnant. Associated symptoms include abdominal pain and nausea. Pertinent negatives include no chills, constipation, diarrhea, dysuria, fever, frequency, urgency or vomiting. The vaginal discharge was bloody. The vaginal bleeding is heavier than menses. She has been passing clots (golf ball sized ). She has not been passing tissue. She is sexually active. She uses an IUD for contraception. Her menstrual history has been irregular.    Past Medical History  Diagnosis Date  . Hypertension     Past Surgical History  Procedure Laterality Date  . Cesarian    . Cesarean section    . Tubal ligation      History reviewed. No pertinent family history.  Social History  Substance Use Topics  . Smoking status: Former Smoker    Quit date: 07/15/2014  . Smokeless tobacco: Never Used  . Alcohol Use: No    Allergies:  Allergies  Allergen Reactions  . Percocet [Oxycodone-Acetaminophen] Hives    Prescriptions prior to admission  Medication Sig Dispense Refill Last Dose  . ibuprofen (ADVIL,MOTRIN) 800 MG tablet Take 1 tablet (800 mg total) by mouth 3 (three) times daily. 90 tablet 4 04/26/2016 at Unknown time  . lisinopril (PRINIVIL,ZESTRIL) 20 MG tablet Take 1 tablet (20 mg total) by mouth daily. 30 tablet 2 04/26/2016 at Unknown time  . propranolol-hydrochlorothiazide  (INDERIDE) 80-25 MG tablet Take 1 tablet by mouth daily. 30 tablet 12 04/26/2016 at Unknown time  . Multiple Vitamin (MULTIVITAMIN WITH MINERALS) TABS tablet Take 1 tablet by mouth daily.   Taking    Review of Systems  Constitutional: Negative for fever and chills.  Gastrointestinal: Positive for nausea and abdominal pain. Negative for vomiting, diarrhea and constipation.  Genitourinary: Positive for vaginal bleeding and pelvic pain. Negative for dysuria, urgency and frequency.   Physical Exam   Blood pressure 132/96, pulse 82, temperature 98.4 F (36.9 C), temperature source Oral, resp. rate 18, height 5' (1.524 m), weight 70.308 kg (155 lb), SpO2 100 %.  Physical Exam  Nursing note and vitals reviewed. Constitutional: She is oriented to person, place, and time. She appears well-developed and well-nourished. No distress.  HENT:  Head: Normocephalic.  Cardiovascular: Normal rate.   Respiratory: Effort normal.  GI: There is no tenderness. There is no rebound.  Genitourinary:   External: no lesion Vagina: small amount of blood see  Cervix: pink, smooth, no CMT, no IUD tail seen  Uterus: NSSC Adnexa: NT   Neurological: She is alert and oriented to person, place, and time.  Skin: Skin is warm and dry.  Psychiatric: She has a normal mood and affect.   Results for orders placed or performed during the hospital encounter of 04/26/16 (from the past 24 hour(s))  Urinalysis, Routine w reflex microscopic (not at [facility])     Status: Abnormal   Collection Time: 04/26/16 10:08 PM  Result Value Ref  Range   Color, Urine RED (A) YELLOW   APPearance TURBID (A) CLEAR   Specific Gravity, Urine 1.025 1.005 - 1.030   pH 6.5 5.0 - 8.0   Glucose, UA NEGATIVE NEGATIVE mg/dL   Hgb urine dipstick LARGE (A) NEGATIVE   Bilirubin Urine NEGATIVE NEGATIVE   Ketones, ur NEGATIVE NEGATIVE mg/dL   Protein, ur >300 (A) NEGATIVE mg/dL   Nitrite NEGATIVE NEGATIVE   Leukocytes, UA TRACE (A) NEGATIVE  Urine  microscopic-add on     Status: Abnormal   Collection Time: 04/26/16 10:08 PM  Result Value Ref Range   Squamous Epithelial / LPF NONE SEEN NONE SEEN   WBC, UA 0-5 0 - 5 WBC/hpf   RBC / HPF TOO NUMEROUS TO COUNT 0 - 5 RBC/hpf   Bacteria, UA MANY (A) NONE SEEN  Pregnancy, urine POC     Status: None   Collection Time: 04/26/16 10:18 PM  Result Value Ref Range   Preg Test, Ur NEGATIVE NEGATIVE  CBC     Status: Abnormal   Collection Time: 04/26/16 10:52 PM  Result Value Ref Range   WBC 5.9 4.0 - 10.5 K/uL   RBC 5.39 (H) 3.87 - 5.11 MIL/uL   Hemoglobin 12.5 12.0 - 15.0 g/dL   HCT 37.9 36.0 - 46.0 %   MCV 70.3 (L) 78.0 - 100.0 fL   MCH 23.2 (L) 26.0 - 34.0 pg   MCHC 33.0 30.0 - 36.0 g/dL   RDW 15.2 11.5 - 15.5 %   Platelets 310 150 - 400 K/uL  Wet prep, genital     Status: Abnormal   Collection Time: 04/26/16 11:32 PM  Result Value Ref Range   Yeast Wet Prep HPF POC NONE SEEN NONE SEEN   Trich, Wet Prep NONE SEEN NONE SEEN   Clue Cells Wet Prep HPF POC NONE SEEN NONE SEEN   WBC, Wet Prep HPF POC MODERATE (A) NONE SEEN   Sperm NONE SEEN    US Transvaginal Non-ob  04/27/2016  CLINICAL DATA:  Heavy vaginal bleeding with clots today. EXAM: TRANSABDOMINAL AND TRANSVAGINAL ULTRASOUND OF PELVIS TECHNIQUE: Both transabdominal and transvaginal ultrasound examinations of the pelvis were performed. Transabdominal technique was performed for global imaging of the pelvis including uterus, ovaries, adnexal regions, and pelvic cul-de-sac. It was necessary to proceed with endovaginal exam following the transabdominal exam to visualize the uterus, endometrium, and ovaries. COMPARISON:  None FINDINGS: Uterus Measurements: 10.3 x 5.5 x 5.3 cm. Three uterine masses are identified. The first is seen to the left in the fundus measuring 2.4 x 1.8 x 2.4 cm. The second is identified just to the right of midline at the fundus measuring 3.2 x 2.6 x 2.9 cm. The third is seen further to the right of midline in the  fundus measuring 2.1 x 1.6 x 1.6 cm. Endometrium Thickness: 4 mm. The endometrium is mildly heterogeneous and there is fluid in the canal. The reported IUD is not visualized. Right ovary Measurements: 3.1 x 2.1 x 2.8 cm. Normal appearance/no adnexal mass. Left ovary Measurements: 2.7 x 1.7 x 2.0 cm. Normal appearance/no adnexal mass. Other findings No abnormal free fluid. IMPRESSION: 1. The reported IUD is not visualized. 2. Fibroid uterus. Electronically Signed   By: Dorise Bullion III M.D   On: 04/27/2016 00:25   US Pelvis Complete  04/27/2016  CLINICAL DATA:  Heavy vaginal bleeding with clots today. EXAM: TRANSABDOMINAL AND TRANSVAGINAL ULTRASOUND OF PELVIS TECHNIQUE: Both transabdominal and transvaginal ultrasound examinations of the pelvis were performed.  Transabdominal technique was performed for global imaging of the pelvis including uterus, ovaries, adnexal regions, and pelvic cul-de-sac. It was necessary to proceed with endovaginal exam following the transabdominal exam to visualize the uterus, endometrium, and ovaries. COMPARISON:  None FINDINGS: Uterus Measurements: 10.3 x 5.5 x 5.3 cm. Three uterine masses are identified. The first is seen to the left in the fundus measuring 2.4 x 1.8 x 2.4 cm. The second is identified just to the right of midline at the fundus measuring 3.2 x 2.6 x 2.9 cm. The third is seen further to the right of midline in the fundus measuring 2.1 x 1.6 x 1.6 cm. Endometrium Thickness: 4 mm. The endometrium is mildly heterogeneous and there is fluid in the canal. The reported IUD is not visualized. Right ovary Measurements: 3.1 x 2.1 x 2.8 cm. Normal appearance/no adnexal mass. Left ovary Measurements: 2.7 x 1.7 x 2.0 cm. Normal appearance/no adnexal mass. Other findings No abnormal free fluid. IMPRESSION: 1. The reported IUD is not visualized. 2. Fibroid uterus. Electronically Signed   By: Dorise Bullion III M.D   On: 04/27/2016 00:25    MAU Course   Procedures  MDM   Assessment and Plan   1. Intrauterine device (IUD) migration, initial encounter ([employer])   2. Vaginal bleeding   3. IUD contraception    DC home Comfort measures reviewed  Bleeding precautions RX: none  Return to MAU as needed FU with OB as planned  Follow-up Information    Schedule an appointment as soon as possible for a visit with HARPER,CHARLES A, MD.   Specialty:  Obstetrics and Gynecology   Contact information:   [place] [address] [phone]        Mathis Bud 04/26/2016, 11:19 PM

## 2016-04-27 ENCOUNTER — Ambulatory Visit (INDEPENDENT_AMBULATORY_CARE_PROVIDER_SITE_OTHER): Payer: BLUE CROSS/BLUE SHIELD | Admitting: Certified Nurse Midwife

## 2016-04-27 ENCOUNTER — Encounter: Payer: Self-pay | Admitting: Certified Nurse Midwife

## 2016-04-27 VITALS — BP 144/89 | HR 68 | Temp 98.1°F | Wt 154.6 lb

## 2016-04-27 DIAGNOSIS — D251 Intramural leiomyoma of uterus: Secondary | ICD-10-CM

## 2016-04-27 DIAGNOSIS — T8389XA Other specified complication of genitourinary prosthetic devices, implants and grafts, initial encounter: Secondary | ICD-10-CM | POA: Diagnosis not present

## 2016-04-27 DIAGNOSIS — D509 Iron deficiency anemia, unspecified: Secondary | ICD-10-CM | POA: Diagnosis not present

## 2016-04-27 DIAGNOSIS — N939 Abnormal uterine and vaginal bleeding, unspecified: Secondary | ICD-10-CM | POA: Diagnosis not present

## 2016-04-27 DIAGNOSIS — T8332XS Displacement of intrauterine contraceptive device, sequela: Secondary | ICD-10-CM

## 2016-04-27 DIAGNOSIS — D259 Leiomyoma of uterus, unspecified: Secondary | ICD-10-CM | POA: Insufficient documentation

## 2016-04-27 MED ORDER — VITAFOL GUMMIES 3.33-0.333-34.8 MG PO CHEW: 3.0000 | CHEWABLE_TABLET | Freq: Every day | ORAL | Status: DC

## 2016-04-27 MED ORDER — IRON POLYSACCH CMPLX-B12-FA 150-0.025-1 MG PO CAPS: 1.0000 | ORAL_CAPSULE | Freq: Every day | ORAL | Status: DC

## 2016-04-27 NOTE — Addendum Note (Signed)
Addended by: Bettye Boeck on: 04/27/2016 02:44 PM   Modules accepted: Orders

## 2016-04-27 NOTE — Discharge Instructions (Signed)
Contraception Choices Contraception (birth control) is the use of any methods or devices to prevent pregnancy. Below are some methods to help avoid pregnancy. HORMONAL METHODS   Contraceptive implant. This is a thin, plastic tube containing progesterone hormone. It does not contain estrogen hormone. Your health care provider inserts the tube in the inner part of the upper arm. The tube can remain in place for up to 3 years. After 3 years, the implant must be removed. The implant prevents the ovaries from releasing an egg (ovulation), thickens the cervical mucus to prevent sperm from entering the uterus, and thins the lining of the inside of the uterus.  Progesterone-only injections. These injections are given every 3 months by your health care provider to prevent pregnancy. This synthetic progesterone hormone stops the ovaries from releasing eggs. It also thickens cervical mucus and changes the uterine lining. This makes it harder for sperm to survive in the uterus.  Birth control pills. These pills contain estrogen and progesterone hormone. They work by preventing the ovaries from releasing eggs (ovulation). They also cause the cervical mucus to thicken, preventing the sperm from entering the uterus. Birth control pills are prescribed by a health care provider.Birth control pills can also be used to treat heavy periods.  Minipill. This type of birth control pill contains only the progesterone hormone. They are taken every day of each month and must be prescribed by your health care provider.  Birth control patch. The patch contains hormones similar to those in birth control pills. It must be changed once a week and is prescribed by a health care provider.  Vaginal ring. The ring contains hormones similar to those in birth control pills. It is left in the vagina for 3 weeks, removed for 1 week, and then a new one is put back in place. The patient must be comfortable inserting and removing the ring  from the vagina.A health care provider's prescription is necessary.  Emergency contraception. Emergency contraceptives prevent pregnancy after unprotected sexual intercourse. This pill can be taken right after sex or up to 5 days after unprotected sex. It is most effective the sooner you take the pills after having sexual intercourse. Most emergency contraceptive pills are available without a prescription. Check with your pharmacist. Do not use emergency contraception as your only form of birth control. BARRIER METHODS   Female condom. This is a thin sheath (latex or rubber) that is worn over the penis during sexual intercourse. It can be used with spermicide to increase effectiveness.  Female condom. This is a soft, loose-fitting sheath that is put into the vagina before sexual intercourse.  Diaphragm. This is a soft, latex, dome-shaped barrier that must be fitted by a health care provider. It is inserted into the vagina, along with a spermicidal jelly. It is inserted before intercourse. The diaphragm should be left in the vagina for 6 to 8 hours after intercourse.  Cervical cap. This is a round, soft, latex or plastic cup that fits over the cervix and must be fitted by a health care provider. The cap can be left in place for up to 48 hours after intercourse.  Sponge. This is a soft, circular piece of polyurethane foam. The sponge has spermicide in it. It is inserted into the vagina after wetting it and before sexual intercourse.  Spermicides. These are chemicals that kill or block sperm from entering the cervix and uterus. They come in the form of creams, jellies, suppositories, foam, or tablets. They do not require a   prescription. They are inserted into the vagina with an applicator before having sexual intercourse. The process must be repeated every time you have sexual intercourse. INTRAUTERINE CONTRACEPTION  Intrauterine device (IUD). This is a T-shaped device that is put in a woman's uterus  during a menstrual period to prevent pregnancy. There are 2 types:  Copper IUD. This type of IUD is wrapped in copper wire and is placed inside the uterus. Copper makes the uterus and fallopian tubes produce a fluid that kills sperm. It can stay in place for 10 years.  Hormone IUD. This type of IUD contains the hormone progestin (synthetic progesterone). The hormone thickens the cervical mucus and prevents sperm from entering the uterus, and it also thins the uterine lining to prevent implantation of a fertilized egg. The hormone can weaken or kill the sperm that get into the uterus. It can stay in place for 3-5 years, depending on which type of IUD is used. PERMANENT METHODS OF CONTRACEPTION  Female tubal ligation. This is when the woman's fallopian tubes are surgically sealed, tied, or blocked to prevent the egg from traveling to the uterus.  Hysteroscopic sterilization. This involves placing a small coil or insert into each fallopian tube. Your doctor uses a technique called hysteroscopy to do the procedure. The device causes scar tissue to form. This results in permanent blockage of the fallopian tubes, so the sperm cannot fertilize the egg. It takes about 3 months after the procedure for the tubes to become blocked. You must use another form of birth control for these 3 months.  Female sterilization. This is when the female has the tubes that carry sperm tied off (vasectomy).This blocks sperm from entering the vagina during sexual intercourse. After the procedure, the man can still ejaculate fluid (semen). NATURAL PLANNING METHODS  Natural family planning. This is not having sexual intercourse or using a barrier method (condom, diaphragm, cervical cap) on days the woman could become pregnant.  Calendar method. This is keeping track of the length of each menstrual cycle and identifying when you are fertile.  Ovulation method. This is avoiding sexual intercourse during ovulation.  Symptothermal  method. This is avoiding sexual intercourse during ovulation, using a thermometer and ovulation symptoms.  Post-ovulation method. This is timing sexual intercourse after you have ovulated. Regardless of which type or method of contraception you choose, it is important that you use condoms to protect against the transmission of sexually transmitted infections (STIs). Talk with your health care provider about which form of contraception is most appropriate for you.   This information is not intended to replace advice given to you by your health care provider. Make sure you discuss any questions you have with your health care provider.   Document Released: 10/15/2005 Document Revised: 10/20/2013 Document Reviewed: 04/09/2013 Elsevier Interactive Patient Education 2016 [employer].  

## 2016-04-27 NOTE — Progress Notes (Signed)
Patient ID: Brianna Gutierrez, female   DOB: 10-25-1976, 40 y.o.   MRN: [id number]  No chief complaint on file.   HPI Brianna Gutierrez is a 40 y.o. female.  Here for f/u after ED visit 04/26/16, was bleeding heavy passed 10 large clots.  Hx of BTL.  Had mirena IUD inserted 10/28/15.  Had been doing well with it until yesterday.   When she experienced heavy bleeding, clots, cramping.  Left work and went to [facility] ED to be seen. [place] at [facility] demonstrated 2 fibroids, intramural, and no IUD in fundus or cervical canal.  Most likely IUD was expelled with large clots that she was experiencing.  Has not been taking any vitamins, reports fatigue.    HPI  Past Medical History  Diagnosis Date  . Hypertension     Past Surgical History  Procedure Laterality Date  . Cesarian    . Cesarean section    . Tubal ligation      No family history on file.  Social History Social History  Substance Use Topics  . Smoking status: Former Smoker    Quit date: 07/15/2014  . Smokeless tobacco: Never Used  . Alcohol Use: No    Allergies  Allergen Reactions  . Percocet [Oxycodone-Acetaminophen] Hives    Current Outpatient Prescriptions  Medication Sig Dispense Refill  . ibuprofen (ADVIL,MOTRIN) 800 MG tablet Take 1 tablet (800 mg total) by mouth 3 (three) times daily. 90 tablet 4  . Iron Polysacch Cmplx-B12-FA 150-0.025-1 MG CAPS Take 1 tablet by mouth daily. 30 each 4  . lisinopril (PRINIVIL,ZESTRIL) 20 MG tablet Take 1 tablet (20 mg total) by mouth daily. 30 tablet 2  . Prenatal Vit-Fe Phos-FA-Omega (VITAFOL GUMMIES) 3.33-0.333-34.8 MG CHEW Chew 3 tablets by mouth daily. 90 tablet 12  . propranolol-hydrochlorothiazide (INDERIDE) 80-25 MG tablet Take 1 tablet by mouth daily. 30 tablet 12   No current facility-administered medications for this visit.    Review of Systems Review of Systems Constitutional: negative for fatigue and weight loss Respiratory: negative for cough and wheezing Cardiovascular: negative for  chest pain, fatigue and palpitations Gastrointestinal: negative for abdominal pain and change in bowel habits Genitourinary:negative Integument/breast: negative for nipple discharge Musculoskeletal:negative for myalgias Neurological: negative for gait problems and tremors Behavioral/Psych: negative for abusive relationship, depression Endocrine: negative for temperature intolerance     There were no vitals taken for this visit.  Physical Exam Physical Exam General:   alert  Skin:   no rash or abnormalities  Lungs:   clear to auscultation bilaterally  Heart:   regular rate and rhythm, S1, S2 normal, no murmur, click, rub or gallop  Breasts:   normal without suspicious masses, skin or nipple changes or axillary nodes  Abdomen:  normal findings: no organomegaly, soft, non-tender and no hernia  Pelvis:  External genitalia: normal general appearance Urinary system: urethral meatus normal and bladder without fullness, nontender Vaginal: normal without tenderness, induration or masses Cervix: normal appearance Adnexa: normal bimanual exam Uterus: anteverted and non-tender, normal size    50% of 15 min visit spent on counseling and coordination of care.   Data Reviewed Previous medical hx, meds, labs, [place]  Assessment     IUD expelled Uterine fibroids Fatigue     Plan   Consultation with Dr. Jodi Mourning for surgical consult for possible uterine ablation, Mirena IUD No orders of the defined types were placed in this encounter.   Meds ordered this encounter  Medications  . Prenatal Vit-Fe Phos-FA-Omega (VITAFOL GUMMIES) 3.33-0.333-34.8 MG  CHEW    Sig: Chew 3 tablets by mouth daily.    Dispense:  90 tablet    Refill:  12  . Iron Polysacch Cmplx-B12-FA 150-0.025-1 MG CAPS    Sig: Take 1 tablet by mouth daily.    Dispense:  30 each    Refill:  4

## 2016-04-28 LAB — CBC WITH DIFFERENTIAL/PLATELET
BASOS: 0 %
Basophils Absolute: 0 10*3/uL (ref 0.0–0.2)
EOS (ABSOLUTE): 0.3 10*3/uL (ref 0.0–0.4)
EOS: 4 %
HEMATOCRIT: 40.2 % (ref 34.0–46.6)
HEMOGLOBIN: 12.3 g/dL (ref 11.1–15.9)
Immature Grans (Abs): 0 10*3/uL (ref 0.0–0.1)
Immature Granulocytes: 0 %
LYMPHS ABS: 2 10*3/uL (ref 0.7–3.1)
Lymphs: 34 %
MCH: 22.8 pg — AB (ref 26.6–33.0)
MCHC: 30.6 g/dL — AB (ref 31.5–35.7)
MCV: 74 fL — AB (ref 79–97)
MONOCYTES: 8 %
Monocytes Absolute: 0.5 10*3/uL (ref 0.1–0.9)
NEUTROS ABS: 3.1 10*3/uL (ref 1.4–7.0)
Neutrophils: 54 %
Platelets: 352 10*3/uL (ref 150–379)
RBC: 5.4 x10E6/uL — ABNORMAL HIGH (ref 3.77–5.28)
RDW: 14.8 % (ref 12.3–15.4)
WBC: 5.8 10*3/uL (ref 3.4–10.8)

## 2016-04-30 LAB — GC/CHLAMYDIA PROBE AMP (~~LOC~~) NOT AT ARMC
Chlamydia: NEGATIVE
NEISSERIA GONORRHEA: NEGATIVE

## 2016-05-14 ENCOUNTER — Encounter: Payer: Self-pay | Admitting: Obstetrics

## 2016-05-14 ENCOUNTER — Ambulatory Visit (INDEPENDENT_AMBULATORY_CARE_PROVIDER_SITE_OTHER): Payer: BLUE CROSS/BLUE SHIELD | Admitting: Obstetrics

## 2016-05-14 VITALS — BP 129/86 | HR 64 | Temp 98.1°F | Wt 150.6 lb

## 2016-05-14 DIAGNOSIS — D251 Intramural leiomyoma of uterus: Secondary | ICD-10-CM

## 2016-05-14 DIAGNOSIS — Z9851 Tubal ligation status: Secondary | ICD-10-CM

## 2016-05-14 DIAGNOSIS — N939 Abnormal uterine and vaginal bleeding, unspecified: Secondary | ICD-10-CM | POA: Diagnosis not present

## 2016-05-14 MED ORDER — MEDROXYPROGESTERONE ACETATE 10 MG PO TABS: 10.0000 mg | ORAL_TABLET | Freq: Every day | ORAL | Status: DC

## 2016-05-14 NOTE — Progress Notes (Signed)
Patient ID: Brianna Gutierrez, female   DOB: 1976/10/10, 40 y.o.   MRN: [id number]  Chief Complaint  Patient presents with  . Advice Only    Patient is in the office to discuss management of her fibriods    HPI Brianna Gutierrez is a 40 y.o. female.  Heavy and painful periods.  H/O fibroids.  Would like treatment of heavy periods.  HPI  Past Medical History  Diagnosis Date  . Hypertension     Past Surgical History  Procedure Laterality Date  . Cesarian    . Cesarean section    . Tubal ligation      History reviewed. No pertinent family history.  Social History Social History  Substance Use Topics  . Smoking status: Former Smoker    Quit date: 07/15/2014  . Smokeless tobacco: Never Used  . Alcohol Use: No    Allergies  Allergen Reactions  . Percocet [Oxycodone-Acetaminophen] Hives  . Tramadol Other (See Comments)    Headache    Current Outpatient Prescriptions  Medication Sig Dispense Refill  . ibuprofen (ADVIL,MOTRIN) 800 MG tablet Take 1 tablet (800 mg total) by mouth 3 (three) times daily. 90 tablet 4  . Iron Polysacch Cmplx-B12-FA 150-0.025-1 MG CAPS Take 1 tablet by mouth daily. 30 each 4  . lisinopril (PRINIVIL,ZESTRIL) 20 MG tablet Take 1 tablet (20 mg total) by mouth daily. 30 tablet 2  . propranolol-hydrochlorothiazide (INDERIDE) 80-25 MG tablet Take 1 tablet by mouth daily. 30 tablet 12  . medroxyPROGESTERone (PROVERA) 10 MG tablet Take 1 tablet (10 mg total) by mouth daily. 30 tablet 0  . Prenatal Vit-Fe Phos-FA-Omega (VITAFOL GUMMIES) 3.33-0.333-34.8 MG CHEW Chew 3 tablets by mouth daily. (Patient not taking: Reported on 05/14/2016) 90 tablet 12   No current facility-administered medications for this visit.    Review of Systems Review of Systems Constitutional: negative for fatigue and weight loss Respiratory: negative for cough and wheezing Cardiovascular: negative for chest pain, fatigue and palpitations Gastrointestinal: negative for abdominal pain and  change in bowel habits Genitourinary:positive for heavy and painful periods Integument/breast: negative for nipple discharge Musculoskeletal:negative for myalgias Neurological: negative for gait problems and tremors Behavioral/Psych: negative for abusive relationship, depression Endocrine: negative for temperature intolerance     Blood pressure 129/86, pulse 64, temperature 98.1 F (36.7 C), weight 150 lb 9.6 oz (68.312 kg), last menstrual period 04/14/2016.  Physical Exam Physical Exam:  Deferred  100% of 10 min visit spent on counseling and coordination of care.   Data Reviewed Labs Ultrasound  Assessment     AUB - hormonal imbalance  Uterine fibroids - intramural    Plan    Discussed options.  Wants Endometrial Ablation.  She has had tubal ligation. Educational material dispensed. Provera Rx x 30 days for endometrial preparation F/U in 3 weeks  No orders of the defined types were placed in this encounter.   Meds ordered this encounter  Medications  . medroxyPROGESTERone (PROVERA) 10 MG tablet    Sig: Take 1 tablet (10 mg total) by mouth daily.    Dispense:  30 tablet    Refill:  0

## 2016-05-25 ENCOUNTER — Encounter (HOSPITAL_COMMUNITY): Payer: Self-pay

## 2016-05-25 ENCOUNTER — Inpatient Hospital Stay (HOSPITAL_COMMUNITY)
Admission: AD | Admit: 2016-05-25 | Discharge: 2016-05-25 | Disposition: A | Discharge status: Home / Self Care | Payer: BLUE CROSS/BLUE SHIELD | Source: Ambulatory Visit | Attending: Obstetrics and Gynecology | Admitting: Obstetrics and Gynecology

## 2016-05-25 DIAGNOSIS — R102 Pelvic and perineal pain: Secondary | ICD-10-CM | POA: Insufficient documentation

## 2016-05-25 DIAGNOSIS — Z87891 Personal history of nicotine dependence: Secondary | ICD-10-CM | POA: Diagnosis not present

## 2016-05-25 DIAGNOSIS — I10 Essential (primary) hypertension: Secondary | ICD-10-CM | POA: Diagnosis not present

## 2016-05-25 LAB — URINALYSIS, ROUTINE W REFLEX MICROSCOPIC
Glucose, UA: NEGATIVE mg/dL
Ketones, ur: 15 mg/dL — AB
Leukocytes, UA: NEGATIVE
NITRITE: NEGATIVE
PH: 5.5 (ref 5.0–8.0)
Protein, ur: 30 mg/dL — AB

## 2016-05-25 LAB — POCT PREGNANCY, URINE: Preg Test, Ur: NEGATIVE

## 2016-05-25 LAB — URINE MICROSCOPIC-ADD ON: WBC UA: NONE SEEN WBC/hpf (ref 0–5)

## 2016-05-25 NOTE — MAU Note (Signed)
Patient states she was seen in the MAU 2-3 weeks ago because she felt like her IUD had was coming out. She was told it was not there anymore but she is still feeling like something is in her vagina. Patient states it feels like when a tampon is in her vagina.

## 2016-05-25 NOTE — MAU Provider Note (Signed)
  History     CSN: [id number]  Arrival date and time: 05/25/16 [id number]   First Provider Initiated Contact with Patient 05/25/16 0100      Chief Complaint  Patient presents with  . Vaginal Pain   Brianna Gutierrez is a 40 y.o. G4P0 who presents today because she felt something "stringy" come out of her vagina earlier. She was worried it was her IUD. She was seen here in June and IUD could not be located. Not seen on pelvic exam or [place]. She has an appointment with Dr. Jodi Mourning for an ablation.     Past Medical History:  Diagnosis Date  . Hypertension     Past Surgical History:  Procedure Laterality Date  . CESAREAN SECTION    . cesarian    . TUBAL LIGATION      History reviewed. No pertinent family history.  Social History  Substance Use Topics  . Smoking status: Former Smoker    Quit date: 07/15/2014  . Smokeless tobacco: Never Used  . Alcohol use No    Allergies:  Allergies  Allergen Reactions  . Percocet [Oxycodone-Acetaminophen] Hives  . Tramadol Other (See Comments)    Headache    Prescriptions Prior to Admission  Medication Sig Dispense Refill Last Dose  . ibuprofen (ADVIL,MOTRIN) 800 MG tablet Take 1 tablet (800 mg total) by mouth 3 (three) times daily. 90 tablet 4 Taking  . Iron Polysacch Cmplx-B12-FA 150-0.025-1 MG CAPS Take 1 tablet by mouth daily. 30 each 4 Taking  . lisinopril (PRINIVIL,ZESTRIL) 20 MG tablet Take 1 tablet (20 mg total) by mouth daily. 30 tablet 2 Taking  . medroxyPROGESTERone (PROVERA) 10 MG tablet Take 1 tablet (10 mg total) by mouth daily. 30 tablet 0   . Prenatal Vit-Fe Phos-FA-Omega (VITAFOL GUMMIES) 3.33-0.333-34.8 MG CHEW Chew 3 tablets by mouth daily. (Patient not taking: Reported on 05/14/2016) 90 tablet 12 Not Taking  . propranolol-hydrochlorothiazide (INDERIDE) 80-25 MG tablet Take 1 tablet by mouth daily. 30 tablet 12 Taking    Review of Systems  Constitutional: Negative for chills and fever.  Gastrointestinal: Negative for abdominal  pain, constipation, diarrhea, nausea and vomiting.  Genitourinary: Negative for dysuria, frequency and urgency.   Physical Exam   Blood pressure 124/81, pulse 93, resp. rate 18, last menstrual period 04/14/2016.  Physical Exam  Nursing note and vitals reviewed. Constitutional: She is oriented to person, place, and time. She appears well-developed and well-nourished. No distress.  HENT:  Head: Normocephalic.  Cardiovascular: Normal rate.   Respiratory: Effort normal.  GI: Soft. There is no tenderness. There is no rebound.  Genitourinary:  Genitourinary Comments:  External: no lesion Vagina: small amount of blood seen, no IUD seen  Cervix: pink, smooth, no CMT, no IUD seen  Uterus: NSSC Adnexa: NT   Neurological: She is alert and oriented to person, place, and time.  Skin: Skin is warm and dry.  Psychiatric: She has a normal mood and affect.    MAU Course  Procedures  MDM   Assessment and Plan   1. Vaginal pain    DC home Comfort measures reviewed  RX: none  Return to MAU as needed   Follow-up Information    HARPER,CHARLES A, MD .   Specialty:  Obstetrics and Gynecology Contact information: [address] [address] [phone]            Mathis Bud 05/25/2016, 1:07 AM

## 2016-06-04 ENCOUNTER — Ambulatory Visit (INDEPENDENT_AMBULATORY_CARE_PROVIDER_SITE_OTHER): Payer: BLUE CROSS/BLUE SHIELD | Admitting: Obstetrics

## 2016-06-04 ENCOUNTER — Encounter: Payer: Self-pay | Admitting: Obstetrics

## 2016-06-04 VITALS — BP 122/86 | HR 87 | Temp 99.2°F | Ht <= 58 in | Wt 146.0 lb

## 2016-06-04 DIAGNOSIS — Z01818 Encounter for other preprocedural examination: Secondary | ICD-10-CM

## 2016-06-04 DIAGNOSIS — N939 Abnormal uterine and vaginal bleeding, unspecified: Secondary | ICD-10-CM | POA: Diagnosis not present

## 2016-06-04 DIAGNOSIS — D251 Intramural leiomyoma of uterus: Secondary | ICD-10-CM

## 2016-06-04 MED ORDER — FERIVA 21/7 75-1 MG PO TABS: 1.0000 | ORAL_TABLET | Freq: Every day | ORAL | 0 refills | Status: DC

## 2016-06-04 NOTE — Progress Notes (Signed)
Patient ID: Brianna Gutierrez, female   DOB: 1976-03-02, 40 y.o.   MRN: [id number]  Chief Complaint  Patient presents with  . Gynecologic Exam    Patient is n the office for follow up to bleeding - started the Provera and her clotting got worse. She had to go to MAU- was seen there and released.    HPI Brianna Gutierrez is a 40 y.o. female.  H/O heavy periods with clots, refractory to medical management.  Wants Endometrial Ablation. HPI  Past Medical History:  Diagnosis Date  . Hypertension     Past Surgical History:  Procedure Laterality Date  . CESAREAN SECTION    . cesarian    . TUBAL LIGATION      No family history on file.  Social History Social History  Substance Use Topics  . Smoking status: Former Smoker    Quit date: 07/15/2014  . Smokeless tobacco: Never Used  . Alcohol use No    Allergies  Allergen Reactions  . Percocet [Oxycodone-Acetaminophen] Hives  . Tramadol Other (See Comments)    Headache    Current Outpatient Prescriptions  Medication Sig Dispense Refill  . ibuprofen (ADVIL,MOTRIN) 800 MG tablet Take 1 tablet (800 mg total) by mouth 3 (three) times daily. 90 tablet 4  . Iron Polysacch Cmplx-B12-FA 150-0.025-1 MG CAPS Take 1 tablet by mouth daily. 30 each 4  . lisinopril (PRINIVIL,ZESTRIL) 20 MG tablet Take 1 tablet (20 mg total) by mouth daily. 30 tablet 2  . medroxyPROGESTERone (PROVERA) 10 MG tablet Take 1 tablet (10 mg total) by mouth daily. 30 tablet 0  . propranolol-hydrochlorothiazide (INDERIDE) 80-25 MG tablet Take 1 tablet by mouth daily. 30 tablet 12  . FeAsp-B12-FA-C-DSS-SuccAc-Zn (FERIVA 21/7) 75-1 MG TABS Take 1 tablet by mouth daily before breakfast. 21 tablet 0   No current facility-administered medications for this visit.     Review of Systems Review of Systems Constitutional: negative for fatigue and weight loss Respiratory: negative for cough and wheezing Cardiovascular: negative for chest pain, fatigue and  palpitations Gastrointestinal: negative for abdominal pain and change in bowel habits Genitourinary:positive for heavy periods Integument/breast: negative for nipple discharge Musculoskeletal:negative for myalgias Neurological: negative for gait problems and tremors Behavioral/Psych: negative for abusive relationship, depression Endocrine: negative for temperature intolerance     Blood pressure 122/86, pulse 87, temperature 99.2 F (37.3 C), height 4\' 9"  (1.448 m), weight 146 lb (66.2 kg), last menstrual period 05/14/2016.  Physical Exam Physical Exam:  Deferred  100% of 10 min visit spent on counseling and coordination of care.   Data Reviewed CBC Ultrasound  Assessment     AUB Uterine Fibroids    Plan    HTA Endometrial Ablation Feriva 21 dispensed Continue Provera endometrial preparation F/U 2 weeks post op  No orders of the defined types were placed in this encounter.  Meds ordered this encounter  Medications  . FeAsp-B12-FA-C-DSS-SuccAc-Zn (FERIVA 21/7) 75-1 MG TABS    Sig: Take 1 tablet by mouth daily before breakfast.    Dispense:  21 tablet    Refill:  0

## 2016-06-05 ENCOUNTER — Other Ambulatory Visit: Payer: Self-pay | Admitting: Obstetrics

## 2016-06-08 NOTE — Patient Instructions (Signed)
Your procedure is scheduled on:  Friday, June 22, 2016  Enter through the Main Entrance of [facility] at:  11:45 AM  Pick up the phone at the desk and dial [phone].  Call this number if you have problems the morning of surgery: [phone].  Remember: Do NOT eat food:  After Midnight Thursday, June 21, 2016  Do NOT drink clear liquids after:  9:00 AM day of surgery  Take these medicines the morning of surgery with a SIP OF WATER:  Lisinopril, Propranolol  Do NOT wear jewelry (body piercing), metal hair clips/bobby pins, make-up, or nail polish. Do NOT wear lotions, powders, or perfumes.  You may wear deodorant. Do NOT shave for 48 hours prior to surgery. Do NOT bring valuables to the hospital. Contacts, dentures, or bridgework may not be worn into surgery.  Leave suitcase in car.  After surgery it may be brought to your room.  For patients admitted to the hospital, checkout time is 11:00 AM the day of discharge.

## 2016-06-11 ENCOUNTER — Encounter (HOSPITAL_COMMUNITY): Payer: Self-pay

## 2016-06-11 ENCOUNTER — Other Ambulatory Visit: Payer: Self-pay

## 2016-06-11 ENCOUNTER — Encounter (HOSPITAL_COMMUNITY)
Admission: RE | Admit: 2016-06-11 | Discharge: 2016-06-11 | Disposition: A | Discharge status: Home / Self Care | Payer: BLUE CROSS/BLUE SHIELD | Source: Ambulatory Visit | Attending: Obstetrics | Admitting: Obstetrics

## 2016-06-11 DIAGNOSIS — I517 Cardiomegaly: Secondary | ICD-10-CM | POA: Insufficient documentation

## 2016-06-11 DIAGNOSIS — Z01810 Encounter for preprocedural cardiovascular examination: Secondary | ICD-10-CM | POA: Insufficient documentation

## 2016-06-11 DIAGNOSIS — Z01818 Encounter for other preprocedural examination: Secondary | ICD-10-CM | POA: Diagnosis present

## 2016-06-11 DIAGNOSIS — R9431 Abnormal electrocardiogram [ECG] [EKG]: Secondary | ICD-10-CM | POA: Insufficient documentation

## 2016-06-11 LAB — CBC
HEMATOCRIT: 35.9 % — AB (ref 36.0–46.0)
HEMOGLOBIN: 11.6 g/dL — AB (ref 12.0–15.0)
MCH: 23.1 pg — AB (ref 26.0–34.0)
MCHC: 32.3 g/dL (ref 30.0–36.0)
MCV: 71.4 fL — ABNORMAL LOW (ref 78.0–100.0)
Platelets: 342 10*3/uL (ref 150–400)
RBC: 5.03 MIL/uL (ref 3.87–5.11)
RDW: 16.2 % — ABNORMAL HIGH (ref 11.5–15.5)
WBC: 6 10*3/uL (ref 4.0–10.5)

## 2016-06-11 LAB — BASIC METABOLIC PANEL
Anion gap: 8 (ref 5–15)
BUN: 12 mg/dL (ref 6–20)
CHLORIDE: 108 mmol/L (ref 101–111)
CO2: 21 mmol/L — AB (ref 22–32)
CREATININE: 0.66 mg/dL (ref 0.44–1.00)
Calcium: 9 mg/dL (ref 8.9–10.3)
GFR calc non Af Amer: >60 mL/min (ref >60)
Glucose, Bld: 80 mg/dL (ref 65–99)
Potassium: 4 mmol/L (ref 3.5–5.1)
Sodium: 137 mmol/L (ref 135–145)

## 2016-06-22 ENCOUNTER — Ambulatory Visit (HOSPITAL_COMMUNITY)
Admission: RE | Admit: 2016-06-22 | Discharge: 2016-06-22 | Disposition: A | Discharge status: Home / Self Care | Payer: BLUE CROSS/BLUE SHIELD | Source: Ambulatory Visit | Attending: Obstetrics | Admitting: Obstetrics

## 2016-06-22 ENCOUNTER — Ambulatory Visit (HOSPITAL_COMMUNITY): Payer: BLUE CROSS/BLUE SHIELD | Admitting: Anesthesiology

## 2016-06-22 ENCOUNTER — Other Ambulatory Visit: Payer: Self-pay | Admitting: Obstetrics

## 2016-06-22 ENCOUNTER — Encounter (HOSPITAL_COMMUNITY)
Admission: RE | Disposition: A | Discharge status: Home / Self Care | Payer: Self-pay | Source: Ambulatory Visit | Attending: Obstetrics

## 2016-06-22 DIAGNOSIS — N938 Other specified abnormal uterine and vaginal bleeding: Secondary | ICD-10-CM | POA: Insufficient documentation

## 2016-06-22 DIAGNOSIS — I10 Essential (primary) hypertension: Secondary | ICD-10-CM | POA: Insufficient documentation

## 2016-06-22 DIAGNOSIS — G8918 Other acute postprocedural pain: Secondary | ICD-10-CM

## 2016-06-22 DIAGNOSIS — N939 Abnormal uterine and vaginal bleeding, unspecified: Secondary | ICD-10-CM | POA: Diagnosis not present

## 2016-06-22 DIAGNOSIS — N946 Dysmenorrhea, unspecified: Secondary | ICD-10-CM | POA: Insufficient documentation

## 2016-06-22 DIAGNOSIS — Z87891 Personal history of nicotine dependence: Secondary | ICD-10-CM | POA: Insufficient documentation

## 2016-06-22 HISTORY — PX: DILITATION & CURRETTAGE/HYSTROSCOPY WITH HYDROTHERMAL ABLATION: SHX5570

## 2016-06-22 LAB — PREGNANCY, URINE: PREG TEST UR: NEGATIVE

## 2016-06-22 SURGERY — DILATATION & CURETTAGE/HYSTEROSCOPY WITH HYDROTHERMAL ABLATION
Anesthesia: General | Site: Vagina

## 2016-06-22 MED ORDER — SODIUM CHLORIDE 0.9 % IR SOLN
Status: DC | PRN
  Administered 2016-06-22: 3000 mL

## 2016-06-22 MED ORDER — DEXAMETHASONE SODIUM PHOSPHATE 4 MG/ML IJ SOLN
INTRAMUSCULAR | Status: AC
  Filled 2016-06-22: qty 1

## 2016-06-22 MED ORDER — DEXAMETHASONE SODIUM PHOSPHATE 10 MG/ML IJ SOLN
INTRAMUSCULAR | Status: DC | PRN
  Administered 2016-06-22 (×2): 4 mg via INTRAVENOUS

## 2016-06-22 MED ORDER — MIDAZOLAM HCL 2 MG/2ML IJ SOLN
INTRAMUSCULAR | Status: AC
  Filled 2016-06-22: qty 2

## 2016-06-22 MED ORDER — LACTATED RINGERS IV SOLN: INTRAVENOUS | Status: DC

## 2016-06-22 MED ORDER — MEPERIDINE HCL 25 MG/ML IJ SOLN: 6.2500 mg | INTRAMUSCULAR | Status: DC | PRN

## 2016-06-22 MED ORDER — FENTANYL CITRATE (PF) 100 MCG/2ML IJ SOLN
25.0000 ug | INTRAMUSCULAR | Status: DC | PRN
  Administered 2016-06-22: 25 ug via INTRAVENOUS
  Administered 2016-06-22 (×3): 50 ug via INTRAVENOUS

## 2016-06-22 MED ORDER — FENTANYL CITRATE (PF) 100 MCG/2ML IJ SOLN: INTRAMUSCULAR | Status: DC | PRN

## 2016-06-22 MED ORDER — FENTANYL CITRATE (PF) 100 MCG/2ML IJ SOLN
INTRAMUSCULAR | Status: AC
  Administered 2016-06-22: 50 ug via INTRAVENOUS
  Filled 2016-06-22: qty 2

## 2016-06-22 MED ORDER — LIDOCAINE HCL 1 % IJ SOLN
INTRAMUSCULAR | Status: DC | PRN
  Administered 2016-06-22: 20 mL

## 2016-06-22 MED ORDER — ONDANSETRON HCL 4 MG/2ML IJ SOLN
INTRAMUSCULAR | Status: AC
  Filled 2016-06-22: qty 2

## 2016-06-22 MED ORDER — HYDROCODONE-ACETAMINOPHEN 5-325 MG PO TABS
1.0000 | ORAL_TABLET | Freq: Once | ORAL | Status: AC
  Administered 2016-06-22: 1 via ORAL

## 2016-06-22 MED ORDER — FENTANYL CITRATE (PF) 100 MCG/2ML IJ SOLN
INTRAMUSCULAR | Status: AC
  Filled 2016-06-22: qty 2

## 2016-06-22 MED ORDER — KETOROLAC TROMETHAMINE 30 MG/ML IJ SOLN
INTRAMUSCULAR | Status: AC
  Filled 2016-06-22: qty 1

## 2016-06-22 MED ORDER — HYDROCODONE-ACETAMINOPHEN 5-325 MG PO TABS: 1.0000 | ORAL_TABLET | Freq: Four times a day (QID) | ORAL | Status: DC | PRN

## 2016-06-22 MED ORDER — KETOROLAC TROMETHAMINE 30 MG/ML IJ SOLN
INTRAMUSCULAR | Status: DC | PRN
  Administered 2016-06-22: 30 mg via INTRAVENOUS

## 2016-06-22 MED ORDER — PROPOFOL 10 MG/ML IV BOLUS
INTRAVENOUS | Status: AC
  Filled 2016-06-22: qty 20

## 2016-06-22 MED ORDER — LIDOCAINE HCL (CARDIAC) 20 MG/ML IV SOLN
INTRAVENOUS | Status: DC | PRN
  Administered 2016-06-22: 100 mg via INTRAVENOUS

## 2016-06-22 MED ORDER — FENTANYL CITRATE (PF) 100 MCG/2ML IJ SOLN
INTRAMUSCULAR | Status: AC
  Administered 2016-06-22: 25 ug via INTRAVENOUS
  Filled 2016-06-22: qty 2

## 2016-06-22 MED ORDER — LIDOCAINE HCL (CARDIAC) 20 MG/ML IV SOLN
INTRAVENOUS | Status: AC
  Filled 2016-06-22: qty 5

## 2016-06-22 MED ORDER — SCOPOLAMINE 1 MG/3DAYS TD PT72
1.0000 | MEDICATED_PATCH | TRANSDERMAL | Status: DC
  Administered 2016-06-22: 1.5 mg via TRANSDERMAL

## 2016-06-22 MED ORDER — HYDROCODONE-ACETAMINOPHEN 5-325 MG PO TABS: 1.0000 | ORAL_TABLET | Freq: Four times a day (QID) | ORAL | 0 refills | Status: DC | PRN

## 2016-06-22 MED ORDER — LACTATED RINGERS IV SOLN
INTRAVENOUS | Status: DC
  Administered 2016-06-22: 12:00:00 via INTRAVENOUS

## 2016-06-22 MED ORDER — FENTANYL CITRATE (PF) 100 MCG/2ML IJ SOLN
INTRAMUSCULAR | Status: DC | PRN
  Administered 2016-06-22 (×2): 50 ug via INTRAVENOUS
  Administered 2016-06-22: 100 ug via INTRAVENOUS

## 2016-06-22 MED ORDER — METOCLOPRAMIDE HCL 5 MG/ML IJ SOLN: 10.0000 mg | Freq: Once | INTRAMUSCULAR | Status: DC | PRN

## 2016-06-22 MED ORDER — SCOPOLAMINE 1 MG/3DAYS TD PT72
MEDICATED_PATCH | TRANSDERMAL | Status: AC
  Administered 2016-06-22: 1.5 mg via TRANSDERMAL
  Filled 2016-06-22: qty 1

## 2016-06-22 MED ORDER — HYDROCODONE-ACETAMINOPHEN 5-325 MG PO TABS
ORAL_TABLET | ORAL | Status: AC
  Filled 2016-06-22: qty 1

## 2016-06-22 MED ORDER — ONDANSETRON HCL 4 MG/2ML IJ SOLN
INTRAMUSCULAR | Status: DC | PRN
  Administered 2016-06-22 (×2): 4 mg via INTRAVENOUS

## 2016-06-22 MED ORDER — SCOPOLAMINE 1 MG/3DAYS TD PT72: 1.0000 | MEDICATED_PATCH | Freq: Once | TRANSDERMAL | Status: DC

## 2016-06-22 MED ORDER — PROPOFOL 10 MG/ML IV BOLUS
INTRAVENOUS | Status: DC | PRN
  Administered 2016-06-22: 150 mg via INTRAVENOUS

## 2016-06-22 MED ORDER — LIDOCAINE HCL 1 % IJ SOLN
INTRAMUSCULAR | Status: AC
  Filled 2016-06-22: qty 20

## 2016-06-22 MED ORDER — KETOROLAC TROMETHAMINE 30 MG/ML IJ SOLN
30.0000 mg | Freq: Once | INTRAMUSCULAR | Status: AC
  Administered 2016-06-22: 30 mg via INTRAMUSCULAR

## 2016-06-22 SURGICAL SUPPLY — 16 items
CATH ROBINSON RED A/P 16FR (CATHETERS) IMPLANT
CLOTH BEACON ORANGE TIMEOUT ST (SAFETY) ×3 IMPLANT
CONTAINER PREFILL 10% NBF 60ML (FORM) ×3 IMPLANT
ELECT REM PT RETURN 9FT ADLT (ELECTROSURGICAL)
ELECTRODE REM PT RTRN 9FT ADLT (ELECTROSURGICAL) IMPLANT
GLOVE BIO SURGEON STRL SZ8 (GLOVE) ×6 IMPLANT
GLOVE BIOGEL PI IND STRL 7.0 (GLOVE) ×1 IMPLANT
GLOVE BIOGEL PI INDICATOR 7.0 (GLOVE) ×2
GOWN STRL REUS W/TWL LRG LVL3 (GOWN DISPOSABLE) ×6 IMPLANT
NEEDLE HYPO 22GX1.5 SAFETY (NEEDLE) ×3 IMPLANT
NS IRRIG 1000ML POUR BTL (IV SOLUTION) IMPLANT
PACK VAGINAL MINOR WOMEN LF (CUSTOM PROCEDURE TRAY) ×3 IMPLANT
PAD OB MATERNITY 4.3X12.25 (PERSONAL CARE ITEMS) ×3 IMPLANT
SET GENESYS HTA PROCERVA (MISCELLANEOUS) ×3 IMPLANT
TOWEL OR 17X24 6PK STRL BLUE (TOWEL DISPOSABLE) ×6 IMPLANT
WATER STERILE IRR 1000ML POUR (IV SOLUTION) ×3 IMPLANT

## 2016-06-22 NOTE — Anesthesia Procedure Notes (Signed)
Procedure Name: LMA Insertion Date/Time: 06/22/2016 1:20 PM Performed by: Westyn Driggers, Sheron Nightingale Pre-anesthesia Checklist: Patient identified, Emergency Drugs available, Patient being monitored, Suction available and Timeout performed Patient Re-evaluated:Patient Re-evaluated prior to inductionOxygen Delivery Method: Circle system utilized Preoxygenation: Pre-oxygenation with 100% oxygen Intubation Type: IV induction LMA: LMA inserted LMA Size: 4.0 Number of attempts: 1 Placement Confirmation: positive ETCO2 and breath sounds checked- equal and bilateral Dental Injury: Teeth and Oropharynx as per pre-operative assessment

## 2016-06-22 NOTE — Discharge Instructions (Signed)
DISCHARGE INSTRUCTIONS: HYSTEROSCOPY / ENDOMETRIAL ABLATION The following instructions have been prepared to help you care for yourself upon your return home.  May Remove Scop patch on or before 06/25/16  May take Ibuprofen after 7:00pm today.  May take stool softner while taking narcotic pain medication to prevent constipation.  Drink plenty of water.  Personal hygiene:  Use sanitary pads for vaginal drainage, not tampons.  Shower the day after your procedure.  NO tub baths, pools or Jacuzzis for 2-3 weeks.  Wipe front to back after using the bathroom.  Activity and limitations:  Do NOT drive or operate any equipment for 24 hours. The effects of anesthesia are still present and drowsiness may result.  Do NOT rest in bed all day.  Walking is encouraged.  Walk up and down stairs slowly.  You may resume your normal activity in one to two days or as indicated by your physician. Sexual activity: NO intercourse for at least 2 weeks after the procedure, or as indicated by your Doctor.  Diet: Eat a light meal as desired this evening. You may resume your usual diet tomorrow.  Return to Work: You may resume your work activities in one to two days or as indicated by Marine scientist.  What to expect after your surgery: Expect to have vaginal bleeding/discharge for 2-3 days and spotting for up to 10 days. It is not unusual to have soreness for up to 1-2 weeks. You may have a slight burning sensation when you urinate for the first day. Mild cramps may continue for a couple of days. You may have a regular period in 2-6 weeks.  Call your doctor for any of the following:  Excessive vaginal bleeding or clotting, saturating and changing one pad every hour.  Inability to urinate 6 hours after discharge from hospital.  Pain not relieved by pain medication.  Fever of 100.4 F or greater.  Unusual vaginal discharge or odor.  Return to office _________________Call for an appointment  ___________________ Patients signature: ______________________ Nurses signature ________________________  Post Anesthesia Care Unit [phone]       Post Anesthesia Home Care Instructions  Activity: Get plenty of rest for the remainder of the day. A responsible adult should stay with you for 24 hours following the procedure.  For the next 24 hours, DO NOT: -Drive a car -Paediatric nurse -Drink alcoholic beverages -Take any medication unless instructed by your physician -Make any legal decisions or sign important papers.  Meals: Start with liquid foods such as gelatin or soup. Progress to regular foods as tolerated. Avoid greasy, spicy, heavy foods. If nausea and/or vomiting occur, drink only clear liquids until the nausea and/or vomiting subsides. Call your physician if vomiting continues.  Special Instructions/Symptoms: Your throat may feel dry or sore from the anesthesia or the breathing tube placed in your throat during surgery. If this causes discomfort, gargle with warm salt water. The discomfort should disappear within 24 hours.  If you had a scopolamine patch placed behind your ear for the management of post- operative nausea and/or vomiting:  1. The medication in the patch is effective for 72 hours, after which it should be removed.  Wrap patch in a tissue and discard in the trash. Wash hands thoroughly with soap and water. 2. You may remove the patch earlier than 72 hours if you experience unpleasant side effects which may include dry mouth, dizziness or visual disturbances. 3. Avoid touching the patch. Wash your hands with soap and water after contact with the  patch. °  ° ° °

## 2016-06-22 NOTE — Anesthesia Procedure Notes (Signed)
Date/Time: 06/15/2016 1:20 PM Performed by: [employer], Sheron Nightingale Pre-anesthesia Checklist: Patient identified, Timeout performed, Emergency Drugs available, Suction available and Patient being monitored Patient Re-evaluated:Patient Re-evaluated prior to inductionOxygen Delivery Method: Circle system utilized Preoxygenation: Pre-oxygenation with 100% oxygen Intubation Type: IV induction LMA: LMA inserted LMA Size: 4.0 Tube size: 4.0 mm Number of attempts: 1 Dental Injury: Teeth and Oropharynx as per pre-operative assessment

## 2016-06-22 NOTE — Anesthesia Preprocedure Evaluation (Signed)
Anesthesia Evaluation  Patient identified by MRN, date of birth, ID band Patient awake    Reviewed: Allergy & Precautions, NPO status , Patient's Chart, lab work & pertinent test results  Airway Mallampati: II  TM Distance: >3 FB Neck ROM: Full    Dental no notable dental hx.    Pulmonary neg pulmonary ROS, former smoker,    Pulmonary exam normal breath sounds clear to auscultation       Cardiovascular hypertension, Pt. on medications Normal cardiovascular exam Rhythm:Regular Rate:Normal     Neuro/Psych negative neurological ROS  negative psych ROS   GI/Hepatic negative GI ROS, Neg liver ROS,   Endo/Other  negative endocrine ROS  Renal/GU negative Renal ROS  negative genitourinary   Musculoskeletal negative musculoskeletal ROS (+)   Abdominal   Peds negative pediatric ROS (+)  Hematology negative hematology ROS (+)   Anesthesia Other Findings   Reproductive/Obstetrics negative OB ROS                             Anesthesia Physical Anesthesia Plan  ASA: II  Anesthesia Plan: General   Post-op Pain Management:    Induction: Intravenous  Airway Management Planned: LMA  Additional Equipment:   Intra-op Plan:   Post-operative Plan: Extubation in OR  Informed Consent: I have reviewed the patients History and Physical, chart, labs and discussed the procedure including the risks, benefits and alternatives for the proposed anesthesia with the patient or authorized representative who has indicated his/her understanding and acceptance.   Dental advisory given  Plan Discussed with: CRNA  Anesthesia Plan Comments:         Anesthesia Quick Evaluation

## 2016-06-22 NOTE — Op Note (Signed)
Preop Diagnosis: AUB   Postop Diagnosis: AUB   Procedure: DILATATION AND CURETTAGE /HYSTEROSCOPY ENDOMETRIAL ABLATION   Anesthesia: General   Anesthesiologist: No responsible provider has been recorded for the case.   Attending: Shelly Bombard, MD   Assistant: None  Findings:  Normal uterine cavity  Pathology:  Endometrial curettings  Fluids:  1054ml  UOP:  267ml  EBL:  58ml  Complications:  None  Procedure: The patient was taken to the operating room after risks benefits and alternatives were discussed with patient, the patient verbalized understanding and consent signed and witnessed. The patient was placed under general anesthesia and prepped and draped in normal sterile fashion. A bivalve speculum was placed in the patient's vagina and the anterior lip of the cervix was grasped with a single-tooth tenaculum. The cervix was dilated for passage of the hysteroscope. The uterus sounded to 8.  The hysteroscope was introduced into the uterine cavity with findings as noted above. A curettage was performed and currettings sent to pathology. The hysteroscope was reintroduced and hydrothermal ablation was performed without difficulty. The tenaculum and bivalve speculum were removed and there was good hemostasis at the tenaculum sites. Sponge lap and needle count was correct. The patient tolerated procedure well and was returned to the recovery room in good condition.

## 2016-06-22 NOTE — Anesthesia Postprocedure Evaluation (Signed)
Anesthesia Post Note  Patient: Therapist, art  Procedure(s) Performed: Procedure(s) (LRB): DILATATION & CURETTAGE/HYSTEROSCOPY WITH HYDROTHERMAL ABLATION (N/A)  Patient location during evaluation: PACU Anesthesia Type: General Level of consciousness: awake and alert Pain management: pain level controlled Vital Signs Assessment: post-procedure vital signs reviewed and stable Respiratory status: spontaneous breathing, nonlabored ventilation, respiratory function stable and patient connected to nasal cannula oxygen Cardiovascular status: blood pressure returned to baseline and stable Postop Assessment: no signs of nausea or vomiting Anesthetic complications: no     Last Vitals:  Vitals:   06/22/16 1445 06/22/16 1455  BP: (!) 148/94   Pulse: 77 75  Resp: (!) 22 17  Temp:      Last Pain:  Vitals:   06/22/16 1455  TempSrc:   PainSc: 8    Pain Goal: Patients Stated Pain Goal: 2 (06/22/16 1201)               Montez Hageman

## 2016-06-22 NOTE — Transfer of Care (Signed)
Immediate Anesthesia Transfer of Care Note  Patient: Brianna Gutierrez  Procedure(s) Performed: Procedure(s): DILATATION & CURETTAGE/HYSTEROSCOPY WITH HYDROTHERMAL ABLATION (N/A)  Patient Location: PACU  Anesthesia Type:General  Level of Consciousness: awake, alert  and oriented  Airway & Oxygen Therapy: Patient Spontanous Breathing and Patient connected to nasal cannula oxygen  Post-op Assessment: Report given to RN and Post -op Vital signs reviewed and stable  Post vital signs: Reviewed and stable  Last Vitals:  Vitals:   06/22/16 1201  BP: 121/89  Pulse: 73  Resp: 16  Temp: 36.7 C    Last Pain:  Vitals:   06/22/16 1201  TempSrc: Oral      Patients Stated Pain Goal: 2 ([id number])  Complications: No apparent anesthesia complications

## 2016-06-22 NOTE — H&P (Signed)
Brianna Gutierrez is an 40 y.o. female. History of heavy and painful periods, unresponsive to medical therapy.  Pertinent Gynecological History: Menses: flow is moderate Bleeding: dysfunctional uterine bleeding Contraception: tubal ligation DES exposure: denies Blood transfusions: none Sexually transmitted diseases: no past history Previous GYN Procedures: none     Menstrual History: Menarche age: 44 Patient's last menstrual period was 05/14/2016.    Past Medical History:  Diagnosis Date  . Hypertension     Past Surgical History:  Procedure Laterality Date  . CESAREAN SECTION    . cesarian    . TUBAL LIGATION      No family history on file.  Social History:  reports that she quit smoking about 23 months ago. She has never used smokeless tobacco. She reports that she does not drink alcohol or use drugs.  Allergies:  Allergies  Allergen Reactions  . Percocet [Oxycodone-Acetaminophen] Hives  . Tramadol Other (See Comments)    Headache    Prescriptions Prior to Admission  Medication Sig Dispense Refill Last Dose  . FeAsp-B12-FA-C-DSS-SuccAc-Zn (FERIVA 21/7) 75-1 MG TABS Take 1 tablet by mouth daily before breakfast. 21 tablet 0 06/21/2016 at Unknown time  . lisinopril (PRINIVIL,ZESTRIL) 20 MG tablet Take 1 tablet (20 mg total) by mouth daily. 30 tablet 2 06/22/2016 at Unknown time  . medroxyPROGESTERone (PROVERA) 10 MG tablet Take 1 tablet (10 mg total) by mouth daily. 30 tablet 0 06/21/2016 at Unknown time  . Multiple Vitamin (MULTIVITAMIN WITH MINERALS) TABS tablet Take 1 tablet by mouth daily.   06/21/2016 at Unknown time  . propranolol-hydrochlorothiazide (INDERIDE) 80-25 MG tablet Take 1 tablet by mouth daily. 30 tablet 12 06/21/2016 at Unknown time  . ibuprofen (ADVIL,MOTRIN) 800 MG tablet Take 1 tablet (800 mg total) by mouth 3 (three) times daily. (Patient taking differently: Take 800 mg by mouth every 8 (eight) hours as needed for moderate pain. ) 90 tablet 4 More than a  month at Unknown time    Review of Systems  All other systems reviewed and are negative.   Last menstrual period 05/14/2016. Physical Exam  Nursing note and vitals reviewed. Constitutional: She is oriented to person, place, and time. She appears well-developed and well-nourished.  HENT:  Head: Normocephalic and atraumatic.  Eyes: Conjunctivae are normal. Pupils are equal, round, and reactive to light.  Neck: Normal range of motion. Neck supple.  Cardiovascular: Normal rate and regular rhythm.   Respiratory: Effort normal and breath sounds normal.  GI: Soft. Bowel sounds are normal.  Genitourinary: Vagina normal and uterus normal.  Musculoskeletal: Normal range of motion.  Neurological: She is alert and oriented to person, place, and time.  Skin: Skin is warm and dry.  Psychiatric: She has a normal mood and affect. Her behavior is normal. Judgment and thought content normal.    No results found for this or any previous visit (from the past 24 hour(s)).  No results found.  Assessment/Plan: AUB.  Hysteroscopy, D&C and HTA Ablation planned.  Brianna Gutierrez A 06/22/2016, 12:00 PM

## 2016-06-26 ENCOUNTER — Encounter (HOSPITAL_COMMUNITY): Payer: Self-pay | Admitting: Obstetrics

## 2016-06-27 ENCOUNTER — Other Ambulatory Visit: Payer: Self-pay | Admitting: Obstetrics

## 2016-06-27 ENCOUNTER — Telehealth: Payer: Self-pay | Admitting: *Deleted

## 2016-06-27 DIAGNOSIS — G8918 Other acute postprocedural pain: Secondary | ICD-10-CM

## 2016-06-27 MED ORDER — HYDROCODONE-ACETAMINOPHEN 10-325 MG PO TABS: 1.0000 | ORAL_TABLET | Freq: Four times a day (QID) | ORAL | 0 refills | Status: DC | PRN

## 2016-06-27 NOTE — Telephone Encounter (Signed)
Phone message left about patient call to office.No reason given by patient.

## 2016-06-27 NOTE — Telephone Encounter (Signed)
Patient returned call and will send Brianna Gutierrez to pick up her prescription.

## 2016-07-16 ENCOUNTER — Ambulatory Visit (INDEPENDENT_AMBULATORY_CARE_PROVIDER_SITE_OTHER): Payer: BLUE CROSS/BLUE SHIELD | Admitting: Obstetrics

## 2016-07-16 ENCOUNTER — Encounter: Payer: Self-pay | Admitting: Obstetrics

## 2016-07-16 VITALS — BP 132/85 | HR 74 | Temp 98.2°F | Wt 152.9 lb

## 2016-07-16 DIAGNOSIS — Z9889 Other specified postprocedural states: Secondary | ICD-10-CM

## 2016-07-16 DIAGNOSIS — N939 Abnormal uterine and vaginal bleeding, unspecified: Secondary | ICD-10-CM | POA: Diagnosis not present

## 2016-07-16 NOTE — Progress Notes (Signed)
Patient ID: Brianna Gutierrez, female   DOB: 04/17/1976, 40 y.o.   MRN: [id number]  Chief Complaint  Patient presents with  . Follow-up    HPI Brianna Gutierrez is a 40 y.o. female.  ~3 weeks post op Hysteroscopy, D&C and HTA Endometrial Ablation for AUB.  No complaints except vaginal discharge. HPI  Past Medical History:  Diagnosis Date  . Hypertension     Past Surgical History:  Procedure Laterality Date  . CESAREAN SECTION    . cesarian    . DILITATION & CURRETTAGE/HYSTROSCOPY WITH HYDROTHERMAL ABLATION N/A 06/22/2016   Procedure: DILATATION & CURETTAGE/HYSTEROSCOPY WITH HYDROTHERMAL ABLATION;  Surgeon: Shelly Bombard, MD;  Location: [facility];  Service: Gynecology;  Laterality: N/A;  . TUBAL LIGATION      History reviewed. No pertinent family history.  Social History Social History  Substance Use Topics  . Smoking status: Former Smoker    Quit date: 07/15/2014  . Smokeless tobacco: Never Used  . Alcohol use No    Allergies  Allergen Reactions  . Percocet [Oxycodone-Acetaminophen] Hives    Pt tolerates Hydrocodone  . Tramadol Other (See Comments)    Headache    Current Outpatient Prescriptions  Medication Sig Dispense Refill  . FeAsp-B12-FA-C-DSS-SuccAc-Zn (FERIVA 21/7) 75-1 MG TABS Take 1 tablet by mouth daily before breakfast. 21 tablet 0  . HYDROcodone-acetaminophen (LORCET HD) 10-325 MG tablet Take 1 tablet by mouth every 6 (six) hours as needed. 28 tablet 0  . ibuprofen (ADVIL,MOTRIN) 800 MG tablet Take 1 tablet (800 mg total) by mouth 3 (three) times daily. (Patient taking differently: Take 800 mg by mouth every 8 (eight) hours as needed for moderate pain. ) 90 tablet 4  . lisinopril (PRINIVIL,ZESTRIL) 20 MG tablet Take 1 tablet (20 mg total) by mouth daily. 30 tablet 2  . Multiple Vitamin (MULTIVITAMIN WITH MINERALS) TABS tablet Take 1 tablet by mouth daily.    . propranolol-hydrochlorothiazide (INDERIDE) 80-25 MG tablet Take 1 tablet by mouth daily. 30 tablet 12  .  HYDROcodone-acetaminophen (NORCO/VICODIN) 5-325 MG tablet Take 1 tablet by mouth every 6 (six) hours as needed for moderate pain. 40 tablet 0   Current Facility-Administered Medications  Medication Dose Route Frequency Provider Last Rate Last Dose  . HYDROcodone-acetaminophen (NORCO/VICODIN) 5-325 MG per tablet 1-2 tablet  1-2 tablet Oral Q6H PRN Shelly Bombard, MD        Review of Systems Review of Systems Constitutional: negative for fatigue and weight loss Respiratory: negative for cough and wheezing Cardiovascular: negative for chest pain, fatigue and palpitations Gastrointestinal: negative for abdominal pain and change in bowel habits Genitourinary:positive for vaginal discharge Integument/breast: negative for nipple discharge Musculoskeletal:negative for myalgias Neurological: negative for gait problems and tremors Behavioral/Psych: negative for abusive relationship, depression Endocrine: negative for temperature intolerance     Blood pressure 132/85, pulse 74, temperature 98.2 F (36.8 C), temperature source Oral, weight 152 lb 14.4 oz (69.4 kg).  Physical Exam Physical Exam General:   alert  Skin:   no rash or abnormalities  Lungs:   clear to auscultation bilaterally  Heart:   regular rate and rhythm, S1, S2 normal, no murmur, click, rub or gallop  Breasts:   normal without suspicious masses, skin or nipple changes or axillary nodes  Abdomen:  normal findings: no organomegaly, soft, non-tender and no hernia  Pelvis:  External genitalia: normal general appearance Urinary system: urethral meatus normal and bladder without fullness, nontender Vaginal: moderate grayish discharge Cervix: normal appearance Adnexa: normal bimanual exam Uterus: anteverted  and non-tender, normal size    50% of 15 min visit spent on counseling and coordination of care.   Data Reviewed Labs Pathology  Assessment     History of AUB.  S/P Hysteroscopy, D&C and HTA.  Doing welll.     Plan    Wet prep sent F/U 6 weeks.  No orders of the defined types were placed in this encounter.  No orders of the defined types were placed in this encounter.   Baltazar Najjar MD  07-14-2016

## 2016-08-27 ENCOUNTER — Encounter: Payer: Self-pay | Admitting: Obstetrics

## 2016-09-10 ENCOUNTER — Ambulatory Visit (INDEPENDENT_AMBULATORY_CARE_PROVIDER_SITE_OTHER): Payer: BLUE CROSS/BLUE SHIELD | Admitting: Obstetrics

## 2016-09-10 ENCOUNTER — Encounter: Payer: Self-pay | Admitting: Obstetrics

## 2016-09-10 VITALS — BP 127/91 | HR 78 | Temp 97.8°F | Wt 155.4 lb

## 2016-09-10 DIAGNOSIS — N939 Abnormal uterine and vaginal bleeding, unspecified: Secondary | ICD-10-CM | POA: Diagnosis not present

## 2016-09-10 DIAGNOSIS — D251 Intramural leiomyoma of uterus: Secondary | ICD-10-CM | POA: Diagnosis not present

## 2016-09-10 DIAGNOSIS — Z9889 Other specified postprocedural states: Secondary | ICD-10-CM

## 2016-09-10 DIAGNOSIS — R102 Pelvic and perineal pain: Secondary | ICD-10-CM | POA: Diagnosis not present

## 2016-09-10 MED ORDER — NAPROXEN SODIUM 550 MG PO TABS: 550.0000 mg | ORAL_TABLET | Freq: Two times a day (BID) | ORAL | 5 refills | Status: DC

## 2016-09-10 NOTE — Progress Notes (Signed)
Patient ID: Brianna Gutierrez, female   DOB: 16-Jan-1976, 40 y.o.   MRN: [id number]  Chief Complaint  Patient presents with  . other    [facility]    HPI Brianna Gutierrez is a 40 y.o. female.  Right - sided pelvic pain.  S/P HTA Endometrial Ablation August 2017.  Now has RLQ pain off and on.  This pain was present prior to ablation.  She has a known history of uterine fibroids, intramural and small.  Denies vaginal bleeding, dysuria or constipation. HPI  Past Medical History:  Diagnosis Date  . Hypertension     Past Surgical History:  Procedure Laterality Date  . CESAREAN SECTION    . cesarian    . DILITATION & CURRETTAGE/HYSTROSCOPY WITH HYDROTHERMAL ABLATION N/A 06/22/2016   Procedure: DILATATION & CURETTAGE/HYSTEROSCOPY WITH HYDROTHERMAL ABLATION;  Surgeon: Shelly Bombard, MD;  Location: [facility];  Service: Gynecology;  Laterality: N/A;  . TUBAL LIGATION      No family history on file.  Social History Social History  Substance Use Topics  . Smoking status: Former Smoker    Quit date: 07/15/2014  . Smokeless tobacco: Never Used  . Alcohol use No    Allergies  Allergen Reactions  . Percocet [Oxycodone-Acetaminophen] Hives    Pt tolerates Hydrocodone  . Tramadol Other (See Comments)    Headache    Current Outpatient Prescriptions  Medication Sig Dispense Refill  . HYDROcodone-acetaminophen (NORCO/VICODIN) 5-325 MG tablet Take 1 tablet by mouth every 6 (six) hours as needed for moderate pain. 40 tablet 0  . lisinopril (PRINIVIL,ZESTRIL) 20 MG tablet Take 1 tablet (20 mg total) by mouth daily. 30 tablet 2  . Multiple Vitamin (MULTIVITAMIN WITH MINERALS) TABS tablet Take 1 tablet by mouth daily.    . propranolol-hydrochlorothiazide (INDERIDE) 80-25 MG tablet Take 1 tablet by mouth daily. 30 tablet 12  . FeAsp-B12-FA-C-DSS-SuccAc-Zn (FERIVA 21/7) 75-1 MG TABS Take 1 tablet by mouth daily before breakfast. (Patient not taking:  Reported on 09/10/2016) 21 tablet 0  . HYDROcodone-acetaminophen (LORCET HD) 10-325 MG tablet Take 1 tablet by mouth every 6 (six) hours as needed. (Patient not taking: Reported on 09/10/2016) 28 tablet 0  . ibuprofen (ADVIL,MOTRIN) 800 MG tablet Take 1 tablet (800 mg total) by mouth 3 (three) times daily. (Patient not taking: Reported on 09/10/2016) 90 tablet 4  . naproxen sodium (ANAPROX DS) 550 MG tablet Take 1 tablet (550 mg total) by mouth 2 (two) times daily with a meal. 60 tablet 5   Current Facility-Administered Medications  Medication Dose Route Frequency Provider Last Rate Last Dose  . HYDROcodone-acetaminophen (NORCO/VICODIN) 5-325 MG per tablet 1-2 tablet  1-2 tablet Oral Q6H PRN Shelly Bombard, MD        Review of Systems Review of Systems Constitutional: negative for fatigue and weight loss Respiratory: negative for cough and wheezing Cardiovascular: negative for chest pain, fatigue and palpitations Gastrointestinal: negative for abdominal pain and change in bowel habits Genitourinary: positive for pelvic pain.  Negative for vaginal bleeding Integument/breast: negative for nipple discharge Musculoskeletal:negative for myalgias Neurological: negative for gait problems and tremors Behavioral/Psych: negative for abusive relationship, depression Endocrine: negative for temperature intolerance      Blood pressure (!) 127/91, pulse 78, temperature 97.8 F (36.6 C), temperature source Oral, weight 155 lb 6.4 oz (70.5 kg).  Physical Exam Physical Exam:  Deferred  >50% of 10 min visit spent on counseling and coordination of care.  Data Reviewed Ultrasound  Assessment     Pelvic pain S/P Endometrial Ablation ( HTA ) for AUB.  AUB resolved.    Plan    Ultrasound ordered Anaprox DS Rx Consider UFE for treatment of pelvic pain, presumably from fibroids. F/U in 2 weeks for results.   Orders Placed This Encounter  Procedures  . US Pelvis Complete    Standing  Status:   Future    Standing Expiration Date:   11/10/2017    Order Specific Question:   Reason for Exam (SYMPTOM  OR DIAGNOSIS REQUIRED)    Answer:   pelvic pain.  s/p endometrial ablation.    Order Specific Question:   Preferred imaging location?    Answer:   [facility]  . US Transvaginal Non-OB    Standing Status:   Future    Standing Expiration Date:   11/10/2017    Order Specific Question:   Reason for Exam (SYMPTOM  OR DIAGNOSIS REQUIRED)    Answer:   pelvic pain.  s/p endometrial ablation.    Order Specific Question:   Preferred imaging location?    Answer:   [facility]   Meds ordered this encounter  Medications  . naproxen sodium (ANAPROX DS) 550 MG tablet    Sig: Take 1 tablet (550 mg total) by mouth 2 (two) times daily with a meal.    Dispense:  60 tablet    Refill:  5

## 2016-09-14 ENCOUNTER — Ambulatory Visit (HOSPITAL_COMMUNITY): Payer: BLUE CROSS/BLUE SHIELD

## 2016-09-18 ENCOUNTER — Ambulatory Visit (HOSPITAL_COMMUNITY)
Admission: RE | Admit: 2016-09-18 | Discharge: 2016-09-18 | Disposition: A | Discharge status: Home / Self Care | Payer: BLUE CROSS/BLUE SHIELD | Source: Ambulatory Visit | Attending: Obstetrics | Admitting: Obstetrics

## 2016-09-18 DIAGNOSIS — R102 Pelvic and perineal pain: Secondary | ICD-10-CM | POA: Diagnosis present

## 2016-09-18 DIAGNOSIS — D251 Intramural leiomyoma of uterus: Secondary | ICD-10-CM | POA: Insufficient documentation

## 2016-09-18 DIAGNOSIS — N800 Endometriosis of uterus: Secondary | ICD-10-CM | POA: Diagnosis not present

## 2016-09-26 ENCOUNTER — Ambulatory Visit (INDEPENDENT_AMBULATORY_CARE_PROVIDER_SITE_OTHER): Payer: BLUE CROSS/BLUE SHIELD | Admitting: Obstetrics

## 2016-09-26 ENCOUNTER — Encounter: Payer: Self-pay | Admitting: Obstetrics

## 2016-09-26 VITALS — BP 133/93 | HR 80 | Temp 98.1°F | Wt 153.5 lb

## 2016-09-26 DIAGNOSIS — I10 Essential (primary) hypertension: Secondary | ICD-10-CM | POA: Diagnosis not present

## 2016-09-26 DIAGNOSIS — Z23 Encounter for immunization: Secondary | ICD-10-CM

## 2016-09-26 DIAGNOSIS — Z9889 Other specified postprocedural states: Secondary | ICD-10-CM

## 2016-09-26 DIAGNOSIS — D251 Intramural leiomyoma of uterus: Secondary | ICD-10-CM | POA: Diagnosis not present

## 2016-09-26 DIAGNOSIS — R102 Pelvic and perineal pain: Secondary | ICD-10-CM

## 2016-09-26 DIAGNOSIS — N939 Abnormal uterine and vaginal bleeding, unspecified: Secondary | ICD-10-CM | POA: Diagnosis not present

## 2016-09-26 MED ORDER — PROPRANOLOL-HCTZ 80-25 MG PO TABS: 1.0000 | ORAL_TABLET | Freq: Every day | ORAL | 12 refills | Status: DC

## 2016-09-26 MED ORDER — LISINOPRIL 20 MG PO TABS: 20.0000 mg | ORAL_TABLET | Freq: Every day | ORAL | 2 refills | Status: DC

## 2016-09-26 NOTE — Progress Notes (Signed)
Patient ID: Brianna Gutierrez, female   DOB: 1976-01-12, 40 y.o.   MRN: [id number]  Chief Complaint  Patient presents with  . Follow-up    Follow up on u/s results    HPI Brianna Gutierrez is a 40 y.o. female.  Presents for ultrasound results.  S/P HTA for AUB, and has had some pelvic pain post op.  Anaprox DS was Rx after last office visit and seems to be working fine. HPI  Past Medical History:  Diagnosis Date  . Hypertension     Past Surgical History:  Procedure Laterality Date  . CESAREAN SECTION    . cesarian    . DILITATION & CURRETTAGE/HYSTROSCOPY WITH HYDROTHERMAL ABLATION N/A 06/22/2016   Procedure: DILATATION & CURETTAGE/HYSTEROSCOPY WITH HYDROTHERMAL ABLATION;  Surgeon: Shelly Bombard, MD;  Location: [facility];  Service: Gynecology;  Laterality: N/A;  . TUBAL LIGATION      History reviewed. No pertinent family history.  Social History Social History  Substance Use Topics  . Smoking status: Former Smoker    Quit date: 07/15/2014  . Smokeless tobacco: Never Used  . Alcohol use No    Allergies  Allergen Reactions  . Percocet [Oxycodone-Acetaminophen] Hives    Pt tolerates Hydrocodone  . Tramadol Other (See Comments)    Headache    Current Outpatient Prescriptions  Medication Sig Dispense Refill  . FeAsp-B12-FA-C-DSS-SuccAc-Zn (FERIVA 21/7) 75-1 MG TABS Take 1 tablet by mouth daily before breakfast. 21 tablet 0  . HYDROcodone-acetaminophen (LORCET HD) 10-325 MG tablet Take 1 tablet by mouth every 6 (six) hours as needed. 28 tablet 0  . HYDROcodone-acetaminophen (NORCO/VICODIN) 5-325 MG tablet Take 1 tablet by mouth every 6 (six) hours as needed for moderate pain. 40 tablet 0  . ibuprofen (ADVIL,MOTRIN) 800 MG tablet Take 1 tablet (800 mg total) by mouth 3 (three) times daily. 90 tablet 4  . lisinopril (PRINIVIL,ZESTRIL) 20 MG tablet Take 1 tablet (20 mg total) by mouth daily. 30 tablet 2  . Multiple Vitamin (MULTIVITAMIN WITH MINERALS) TABS tablet Take 1 tablet by mouth  daily.    . naproxen sodium (ANAPROX DS) 550 MG tablet Take 1 tablet (550 mg total) by mouth 2 (two) times daily with a meal. 60 tablet 5  . propranolol-hydrochlorothiazide (INDERIDE) 80-25 MG tablet Take 1 tablet by mouth daily. 30 tablet 12   Current Facility-Administered Medications  Medication Dose Route Frequency Provider Last Rate Last Dose  . HYDROcodone-acetaminophen (NORCO/VICODIN) 5-325 MG per tablet 1-2 tablet  1-2 tablet Oral Q6H PRN Shelly Bombard, MD        Review of Systems Review of Systems Constitutional: negative for fatigue and weight loss Respiratory: negative for cough and wheezing Cardiovascular: negative for chest pain, fatigue and palpitations Gastrointestinal: negative for abdominal pain and change in bowel habits Genitourinary:negative Integument/breast: negative for nipple discharge Musculoskeletal:negative for myalgias Neurological: negative for gait problems and tremors Behavioral/Psych: negative for abusive relationship, depression Endocrine: negative for temperature intolerance      Blood pressure (!) 133/93, pulse 80, temperature 98.1 F (36.7 C), temperature source Oral, weight 153 lb 8 oz (69.6 kg).  Physical Exam Physical Exam:  Deferred >50% of 10 min visit spent on counseling and coordination of care.    Data Reviewed Ultrasound:  Slightly enlarged uterus with small intramural fibroids and features suggestive of adenomyosis.  Ovaries and tubes normal.  Assessment     Pelvic pain - anatomic response to fibroids / adenomyosis.    Plan    Continue Anaprox DS. Follow up  3 months for pap and annual exam  Orders Placed This Encounter  Procedures  . Flu Vaccine QUAD 36+ mos IM  . Ambulatory referral to Internal Medicine    Referral Priority:   Routine    Referral Type:   Consultation    Referral Reason:   Specialty Services Required    Requested Specialty:   Internal Medicine    Number of Visits Requested:   1   Meds ordered this  encounter  Medications  . lisinopril (PRINIVIL,ZESTRIL) 20 MG tablet    Sig: Take 1 tablet (20 mg total) by mouth daily.    Dispense:  30 tablet    Refill:  2  . propranolol-hydrochlorothiazide (INDERIDE) 80-25 MG tablet    Sig: Take 1 tablet by mouth daily.    Dispense:  30 tablet    Refill:  12

## 2016-11-13 ENCOUNTER — Encounter: Payer: Self-pay | Admitting: Certified Nurse Midwife

## 2016-11-13 ENCOUNTER — Ambulatory Visit (INDEPENDENT_AMBULATORY_CARE_PROVIDER_SITE_OTHER): Payer: BLUE CROSS/BLUE SHIELD | Admitting: Certified Nurse Midwife

## 2016-11-13 VITALS — BP 112/76 | HR 84 | Temp 98.7°F | Wt 150.7 lb

## 2016-11-13 DIAGNOSIS — Z01419 Encounter for gynecological examination (general) (routine) without abnormal findings: Secondary | ICD-10-CM

## 2016-11-13 DIAGNOSIS — Z9851 Tubal ligation status: Secondary | ICD-10-CM

## 2016-11-13 DIAGNOSIS — Z0000 Encounter for general adult medical examination without abnormal findings: Secondary | ICD-10-CM

## 2016-11-13 DIAGNOSIS — N939 Abnormal uterine and vaginal bleeding, unspecified: Secondary | ICD-10-CM

## 2016-11-13 DIAGNOSIS — Z113 Encounter for screening for infections with a predominantly sexual mode of transmission: Secondary | ICD-10-CM

## 2016-11-13 DIAGNOSIS — D508 Other iron deficiency anemias: Secondary | ICD-10-CM

## 2016-11-13 DIAGNOSIS — Z720 Tobacco use: Secondary | ICD-10-CM

## 2016-11-13 MED ORDER — FERIVA 21/7 75-1 MG PO TABS: 1.0000 | ORAL_TABLET | Freq: Every day | ORAL | 3 refills | Status: DC

## 2016-11-13 MED ORDER — IBUPROFEN 800 MG PO TABS: 800.0000 mg | ORAL_TABLET | Freq: Three times a day (TID) | ORAL | 4 refills | Status: DC

## 2016-11-13 NOTE — Progress Notes (Signed)
Subjective:        Brianna Gutierrez is a 41 y.o. female here for a routine exam.  Current complaints: none, s/p ablation in August of 2017, does have occasional periods, lighter than before with red bleeding, occasional cramping.  10/05/15 had IUD placed for AUB, was expelled.   States that she is down to 2 cigs/day.  Encouraged to quit smoking.    Personal health questionnaire:  Is patient Ashkenazi Jewish, have a family history of breast and/or ovarian cancer: no Is there a family history of uterine cancer diagnosed at age < 69, gastrointestinal cancer, urinary tract cancer, family member who is a Field seismologist syndrome-associated carrier: no Is the patient overweight and hypertensive, family history of diabetes, personal history of gestational diabetes, preeclampsia or PCOS: yes Is patient over 18, have PCOS,  family history of premature CHD under age 19, diabetes, smoke, have hypertension or peripheral artery disease:  yes At any time, has a partner hit, kicked or otherwise hurt or frightened you?: no Over the past 2 weeks, have you felt down, depressed or hopeless?: no Over the past 2 weeks, have you felt little interest or pleasure in doing things?:no   Gynecologic History No LMP recorded. Contraception: tubal ligation Last Pap: unknown. Results were: normal according to the patient Last mammogram: Recently at [facility]. Results were: normal  Obstetric History OB History  Gravida Para Term Preterm AB Living  4         4  SAB TAB Ectopic Multiple Live Births          4    # Outcome Date GA Lbr Len/2nd Weight Sex Delivery Anes PTL Lv  4 Gravida     F Vag-Spont   LIV  3 Gravida     M CS-Unspec   LIV  2 Gravida     F Vag-Spont   LIV  1 Gravida     M Vag-Spont  Y LIV      Past Medical History:  Diagnosis Date  . Hypertension     Past Surgical History:  Procedure Laterality Date  . CESAREAN SECTION    . cesarian    . DILITATION & CURRETTAGE/HYSTROSCOPY WITH HYDROTHERMAL ABLATION N/A  06/22/2016   Procedure: DILATATION & CURETTAGE/HYSTEROSCOPY WITH HYDROTHERMAL ABLATION;  Surgeon: Shelly Bombard, MD;  Location: [facility];  Service: Gynecology;  Laterality: N/A;  . TUBAL LIGATION       Current Outpatient Prescriptions:  .  ibuprofen (ADVIL,MOTRIN) 800 MG tablet, Take 1 tablet (800 mg total) by mouth 3 (three) times daily., Disp: 90 tablet, Rfl: 4 .  lisinopril (PRINIVIL,ZESTRIL) 20 MG tablet, Take 1 tablet (20 mg total) by mouth daily., Disp: 30 tablet, Rfl: 2 .  naproxen sodium (ANAPROX DS) 550 MG tablet, Take 1 tablet (550 mg total) by mouth 2 (two) times daily with a meal., Disp: 60 tablet, Rfl: 5 .  propranolol-hydrochlorothiazide (INDERIDE) 80-25 MG tablet, Take 1 tablet by mouth daily., Disp: 30 tablet, Rfl: 12 .  FeAsp-B12-FA-C-DSS-SuccAc-Zn (FERIVA 21/7) 75-1 MG TABS, Take 1 tablet by mouth daily before breakfast., Disp: 21 tablet, Rfl: 3 .  HYDROcodone-acetaminophen (LORCET HD) 10-325 MG tablet, Take 1 tablet by mouth every 6 (six) hours as needed. (Patient not taking: Reported on 11/13/2016), Disp: 28 tablet, Rfl: 0 .  HYDROcodone-acetaminophen (NORCO/VICODIN) 5-325 MG tablet, Take 1 tablet by mouth every 6 (six) hours as needed for moderate pain. (Patient not taking: Reported on 11/13/2016), Disp: 40 tablet, Rfl: 0 .  Multiple Vitamin (MULTIVITAMIN WITH  MINERALS) TABS tablet, Take 1 tablet by mouth daily., Disp: , Rfl:   Current Facility-Administered Medications:  .  HYDROcodone-acetaminophen (NORCO/VICODIN) 5-325 MG per tablet 1-2 tablet, 1-2 tablet, Oral, Q6H PRN, Shelly Bombard, MD Allergies  Allergen Reactions  . Percocet [Oxycodone-Acetaminophen] Hives    Pt tolerates Hydrocodone  . Tramadol Other (See Comments)    Headache    Social History  Substance Use Topics  . Smoking status: Former Smoker    Quit date: 07/15/2014  . Smokeless tobacco: Never Used  . Alcohol use No    History reviewed. No pertinent family history.    Review of  Systems  Constitutional: negative for fatigue and weight loss Respiratory: negative for cough and wheezing Cardiovascular: negative for chest pain, fatigue and palpitations Gastrointestinal: negative for abdominal pain and change in bowel habits Musculoskeletal:negative for myalgias Neurological: negative for gait problems and tremors Behavioral/Psych: negative for abusive relationship, depression Endocrine: negative for temperature intolerance    Genitourinary:negative for abnormal menstrual periods, genital lesions, hot flashes, sexual problems and vaginal discharge Integument/breast: negative for breast lump, breast tenderness, nipple discharge and skin lesion(s)    Objective:       BP 112/76   Pulse 84   Temp 98.7 F (37.1 C) (Oral)   Wt 150 lb 11.2 oz (68.4 kg)   BMI 29.43 kg/m  General:   alert  Skin:   no rash or abnormalities  Lungs:   clear to auscultation bilaterally  Heart:   regular rate and rhythm, S1, S2 normal, no murmur, click, rub or gallop  Breasts:   normal without suspicious masses, skin or nipple changes or axillary nodes  Abdomen:  normal findings: no organomegaly, soft, non-tender and no hernia  Pelvis:  External genitalia: normal general appearance Urinary system: urethral meatus normal and bladder without fullness, nontender Vaginal: normal without tenderness, induration or masses Cervix: normal appearance Adnexa: normal bimanual exam Uterus: anteverted and non-tender, normal size   Lab Review Urine pregnancy test Labs reviewed yes Radiologic studies reviewed no  50% of 30 min visit spent on counseling and coordination of care.    Assessment:    Healthy female exam.   H/O AUB  STD screening exam  Fatigue  H/O HTN: stable   Tobacco abuse  Plan:    Education reviewed: calcium supplements, depression evaluation, low fat, low cholesterol diet, safe sex/STD prevention, self breast exams, skin cancer screening, smoking cessation and weight  bearing exercise. Contraception: tubal ligation. Follow up in: 1 year.   Meds ordered this encounter  Medications  . ibuprofen (ADVIL,MOTRIN) 800 MG tablet    Sig: Take 1 tablet (800 mg total) by mouth 3 (three) times daily.    Dispense:  90 tablet    Refill:  4  . FeAsp-B12-FA-C-DSS-SuccAc-Zn (FERIVA 21/7) 75-1 MG TABS    Sig: Take 1 tablet by mouth daily before breakfast.    Dispense:  21 tablet    Refill:  3   Orders Placed This Encounter  Procedures  . RPR  . Hepatitis C antibody  . Hepatitis B surface antigen  . HIV antibody  . CBC with Differential/Platelet  . Comprehensive metabolic panel  . TSH  . Hemoglobin A1c   Need to obtain previous records

## 2016-11-13 NOTE — Addendum Note (Signed)
Addended by: Manuela Schwartz C on: 11/13/2016 11:43 AM   Modules accepted: Orders

## 2016-11-14 LAB — CBC WITH DIFFERENTIAL/PLATELET
Basophils Absolute: 0 10*3/uL (ref 0.0–0.2)
Basos: 0 %
EOS (ABSOLUTE): 0.2 10*3/uL (ref 0.0–0.4)
EOS: 3 %
HEMATOCRIT: 39.3 % (ref 34.0–46.6)
HEMOGLOBIN: 12.2 g/dL (ref 11.1–15.9)
IMMATURE GRANULOCYTES: 0 %
Immature Grans (Abs): 0 10*3/uL (ref 0.0–0.1)
LYMPHS: 30 %
Lymphocytes Absolute: 1.7 10*3/uL (ref 0.7–3.1)
MCH: 22.2 pg — ABNORMAL LOW (ref 26.6–33.0)
MCHC: 31 g/dL — ABNORMAL LOW (ref 31.5–35.7)
MCV: 72 fL — ABNORMAL LOW (ref 79–97)
Monocytes Absolute: 0.5 10*3/uL (ref 0.1–0.9)
Monocytes: 8 %
NEUTROS PCT: 59 %
Neutrophils Absolute: 3.3 10*3/uL (ref 1.4–7.0)
Platelets: 427 10*3/uL — ABNORMAL HIGH (ref 150–379)
RBC: 5.5 x10E6/uL — AB (ref 3.77–5.28)
RDW: 16.6 % — ABNORMAL HIGH (ref 12.3–15.4)
WBC: 5.7 10*3/uL (ref 3.4–10.8)

## 2016-11-14 LAB — COMPREHENSIVE METABOLIC PANEL
ALBUMIN: 4 g/dL (ref 3.5–5.5)
ALT: 19 IU/L (ref 0–32)
AST: 14 IU/L (ref 0–40)
Albumin/Globulin Ratio: 1.3 (ref 1.2–2.2)
Alkaline Phosphatase: 90 IU/L (ref 39–117)
BUN / CREAT RATIO: 20 (ref 9–23)
BUN: 16 mg/dL (ref 6–24)
Bilirubin Total: <0.2 mg/dL (ref 0.0–1.2)
CALCIUM: 9.3 mg/dL (ref 8.7–10.2)
CO2: 23 mmol/L (ref 18–29)
CREATININE: 0.8 mg/dL (ref 0.57–1.00)
Chloride: 101 mmol/L (ref 96–106)
GFR calc Af Amer: 107 mL/min/{1.73_m2} (ref >59)
GFR, EST NON AFRICAN AMERICAN: 93 mL/min/{1.73_m2} (ref >59)
GLOBULIN, TOTAL: 3.2 g/dL (ref 1.5–4.5)
Glucose: 60 mg/dL — ABNORMAL LOW (ref 65–99)
Potassium: 4.4 mmol/L (ref 3.5–5.2)
SODIUM: 139 mmol/L (ref 134–144)
Total Protein: 7.2 g/dL (ref 6.0–8.5)

## 2016-11-14 LAB — HEPATITIS C ANTIBODY: Hep C Virus Ab: <0.1 s/co ratio (ref 0.0–0.9)

## 2016-11-14 LAB — HIV ANTIBODY (ROUTINE TESTING W REFLEX): HIV Screen 4th Generation wRfx: NONREACTIVE

## 2016-11-14 LAB — HEMOGLOBIN A1C
Est. average glucose Bld gHb Est-mCnc: 117 mg/dL
HEMOGLOBIN A1C: 5.7 % — AB (ref 4.8–5.6)

## 2016-11-14 LAB — HEPATITIS B SURFACE ANTIGEN: Hepatitis B Surface Ag: NEGATIVE

## 2016-11-14 LAB — RPR: RPR Ser Ql: NONREACTIVE

## 2016-11-14 LAB — TSH: TSH: 1.2 u[IU]/mL (ref 0.450–4.500)

## 2016-11-15 ENCOUNTER — Other Ambulatory Visit: Payer: Self-pay | Admitting: Certified Nurse Midwife

## 2016-11-15 DIAGNOSIS — R7303 Prediabetes: Secondary | ICD-10-CM | POA: Insufficient documentation

## 2016-11-15 LAB — CERVICOVAGINAL ANCILLARY ONLY
Bacterial vaginitis: NEGATIVE
CANDIDA VAGINITIS: NEGATIVE
CHLAMYDIA, DNA PROBE: NEGATIVE
NEISSERIA GONORRHEA: NEGATIVE
TRICH (WINDOWPATH): NEGATIVE

## 2016-11-16 LAB — CYTOLOGY - PAP
DIAGNOSIS: NEGATIVE
HPV (WINDOPATH): NOT DETECTED

## 2016-11-21 ENCOUNTER — Other Ambulatory Visit: Payer: Self-pay | Admitting: *Deleted

## 2016-11-21 DIAGNOSIS — Z7689 Persons encountering health services in other specified circumstances: Secondary | ICD-10-CM

## 2016-11-21 NOTE — Progress Notes (Signed)
Pt referred to nutrition and diabetes services for increase in A1C.

## 2016-12-24 ENCOUNTER — Ambulatory Visit: Payer: BLUE CROSS/BLUE SHIELD | Admitting: Registered"

## 2017-01-03 ENCOUNTER — Ambulatory Visit: Payer: BLUE CROSS/BLUE SHIELD | Admitting: Registered"

## 2017-04-04 ENCOUNTER — Encounter: Payer: Self-pay | Admitting: Obstetrics

## 2017-04-04 ENCOUNTER — Ambulatory Visit (INDEPENDENT_AMBULATORY_CARE_PROVIDER_SITE_OTHER): Payer: BLUE CROSS/BLUE SHIELD | Admitting: Obstetrics

## 2017-04-04 VITALS — BP 130/92 | HR 76 | Wt 157.5 lb

## 2017-04-04 DIAGNOSIS — N644 Mastodynia: Secondary | ICD-10-CM

## 2017-04-04 NOTE — Progress Notes (Signed)
Patient ID: Brianna Gutierrez, female   DOB: [birth year], 41 y.o.   MRN: [id number]  Chief Complaint  Patient presents with  . Gynecologic Exam    HPI Brianna Gutierrez is a 41 y.o. female.  Breast pain and swelling for the past 3-4 weeks.  Had mammogram done 2 / 2018 and it was normal.  Denies trauma. HPI  Past Medical History:  Diagnosis Date  . Hypertension     Past Surgical History:  Procedure Laterality Date  . CESAREAN SECTION    . cesarian    . DILITATION & CURRETTAGE/HYSTROSCOPY WITH HYDROTHERMAL ABLATION N/A 06/22/2016   Procedure: DILATATION & CURETTAGE/HYSTEROSCOPY WITH HYDROTHERMAL ABLATION;  Surgeon: Shelly Bombard, MD;  Location: [facility];  Service: Gynecology;  Laterality: N/A;  . TUBAL LIGATION      History reviewed. No pertinent family history.  Social History Social History  Substance Use Topics  . Smoking status: Current Every Day Smoker    Types: Cigarettes    Last attempt to quit: 07/15/2014  . Smokeless tobacco: Never Used  . Alcohol use 0.0 oz/week     Comment: socially    Allergies  Allergen Reactions  . Percocet [Oxycodone-Acetaminophen] Hives    Pt tolerates Hydrocodone  . Tramadol Other (See Comments)    Headache    Current Outpatient Prescriptions  Medication Sig Dispense Refill  . ibuprofen (ADVIL,MOTRIN) 800 MG tablet Take 1 tablet (800 mg total) by mouth 3 (three) times daily. 90 tablet 4  . lisinopril (PRINIVIL,ZESTRIL) 20 MG tablet Take 1 tablet (20 mg total) by mouth daily. 30 tablet 2  . propranolol-hydrochlorothiazide (INDERIDE) 80-25 MG tablet Take 1 tablet by mouth daily. 30 tablet 12   Current Facility-Administered Medications  Medication Dose Route Frequency Provider Last Rate Last Dose  . HYDROcodone-acetaminophen (NORCO/VICODIN) 5-325 MG per tablet 1-2 tablet  1-2 tablet Oral Q6H PRN Shelly Bombard, MD        Review of Systems Review of Systems Constitutional: negative for fatigue and weight loss Respiratory: negative for  cough and wheezing Cardiovascular: negative for chest pain, fatigue and palpitations Gastrointestinal: negative for abdominal pain and change in bowel habits Genitourinary:negative Integument/breast: positive for breast pain Musculoskeletal:negative for myalgias Neurological: negative for gait problems and tremors Behavioral/Psych: negative for abusive relationship, depression Endocrine: negative for temperature intolerance      Blood pressure (!) 130/92, pulse 76, weight 157 lb 8 oz (71.4 kg), last menstrual period 03/27/2017.  Physical Exam Physical Exam General:   alert  Skin:   no rash or abnormalities  Lungs:   clear to auscultation bilaterally  Heart:   regular rate and rhythm, S1, S2 normal, no murmur, click, rub or gallop  Breasts:   normal without suspicious masses, skin or nipple changes or axillary nodes  50% of 15 min visit spent on counseling and coordination of care.    Data Reviewed Labs Mammogram  Assessment     Mastodynia, bilateral   Normal breast exam  Plan    Patient will call [facility] for follow up  No orders of the defined types were placed in this encounter.  No orders of the defined types were placed in this encounter.        Patient ID: Brianna Gutierrez, female   DOB: 04-08-1976, 41 y.o.   MRN: [id number]

## 2017-05-15 ENCOUNTER — Emergency Department (HOSPITAL_COMMUNITY)
Admission: EM | Admit: 2017-05-15 | Discharge: 2017-05-16 | Disposition: A | Discharge status: Home / Self Care | Payer: BLUE CROSS/BLUE SHIELD | Attending: Emergency Medicine | Admitting: Emergency Medicine

## 2017-05-15 ENCOUNTER — Encounter (HOSPITAL_COMMUNITY): Payer: Self-pay | Admitting: Emergency Medicine

## 2017-05-15 ENCOUNTER — Emergency Department (HOSPITAL_COMMUNITY): Payer: BLUE CROSS/BLUE SHIELD

## 2017-05-15 DIAGNOSIS — I10 Essential (primary) hypertension: Secondary | ICD-10-CM | POA: Insufficient documentation

## 2017-05-15 DIAGNOSIS — R0789 Other chest pain: Secondary | ICD-10-CM

## 2017-05-15 DIAGNOSIS — F17210 Nicotine dependence, cigarettes, uncomplicated: Secondary | ICD-10-CM | POA: Insufficient documentation

## 2017-05-15 DIAGNOSIS — R0602 Shortness of breath: Secondary | ICD-10-CM | POA: Insufficient documentation

## 2017-05-15 DIAGNOSIS — Z79899 Other long term (current) drug therapy: Secondary | ICD-10-CM | POA: Insufficient documentation

## 2017-05-15 HISTORY — DX: Prediabetes: R73.03

## 2017-05-15 LAB — CBC
HCT: 40.3 % (ref 36.0–46.0)
HEMOGLOBIN: 13.4 g/dL (ref 12.0–15.0)
MCH: 23.6 pg — ABNORMAL LOW (ref 26.0–34.0)
MCHC: 33.3 g/dL (ref 30.0–36.0)
MCV: 71.1 fL — ABNORMAL LOW (ref 78.0–100.0)
PLATELETS: 384 10*3/uL (ref 150–400)
RBC: 5.67 MIL/uL — AB (ref 3.87–5.11)
RDW: 14.6 % (ref 11.5–15.5)
WBC: 8.1 10*3/uL (ref 4.0–10.5)

## 2017-05-15 LAB — BASIC METABOLIC PANEL
ANION GAP: 9 (ref 5–15)
BUN: 15 mg/dL (ref 6–20)
CALCIUM: 9 mg/dL (ref 8.9–10.3)
CO2: 25 mmol/L (ref 22–32)
CREATININE: 0.74 mg/dL (ref 0.44–1.00)
Chloride: 105 mmol/L (ref 101–111)
Glucose, Bld: 86 mg/dL (ref 65–99)
Potassium: 3.1 mmol/L — ABNORMAL LOW (ref 3.5–5.1)
SODIUM: 139 mmol/L (ref 135–145)

## 2017-05-15 LAB — POCT I-STAT TROPONIN I: Troponin i, poc: 0.01 ng/mL (ref 0.00–0.08)

## 2017-05-15 NOTE — ED Triage Notes (Signed)
Pt states she is having chest pain that started this morning  Pt states the pain is in the middle of her chest  Pt states she feels short of breath and is having pain in her back as well  Pt denies nausea or vomiting

## 2017-05-15 NOTE — ED Notes (Signed)
Pt refuses to be on cardiac monitor pt complained she took them off because they were itching her

## 2017-05-15 NOTE — ED Provider Notes (Signed)
[facility] DEPT Provider Note   CSN: [id number] Arrival date & time: 05/15/17  2206  By signing my name below, I, [name], attest that this documentation has been prepared under the direction and in the presence of Deno Etienne, DO. Electronically Signed: [name], ED Scribe. 05/15/17. 12:35 AM.  History   Chief Complaint Chief Complaint  Patient presents with  . Chest Pain  . Shortness of Breath   The history is provided by the patient. No language interpreter was used.  Illness  This is a new problem. The current episode started 12 to 24 hours ago. The problem occurs constantly. The problem has not changed since onset.Associated symptoms include chest pain and shortness of breath. Pertinent negatives include no headaches. The symptoms are aggravated by exertion and walking. Nothing relieves the symptoms. She has tried nothing for the symptoms. The treatment provided no relief.   HPI Comments: Brianna Gutierrez is a 41 y.o. female who presents to the Emergency Department complaining of gradually resolving, persistent "sharp" central chest pain with associated SOB that began this morning. Walking exacerbates her pain. She has a h/x of tobacco abuse, HTN, and pre-diabetes. She denies hemoptysis. She also denies recent surgery.   Past Medical History:  Diagnosis Date  . Borderline diabetes   . Hypertension     Patient Active Problem List   Diagnosis Date Noted  . Pre-diabetes 11/15/2016  . Uterine fibroid 04/27/2016  . Abnormal uterine bleeding (AUB) 09/28/2015  . History of bilateral tubal ligation 09/28/2015  . Tobacco abuse 09/28/2015    Past Surgical History:  Procedure Laterality Date  . CESAREAN SECTION    . cesarian    . DILITATION & CURRETTAGE/HYSTROSCOPY WITH HYDROTHERMAL ABLATION N/A 06/22/2016   Procedure: DILATATION & CURETTAGE/HYSTEROSCOPY WITH HYDROTHERMAL ABLATION;  Surgeon: Shelly Bombard, MD;  Location: [facility];  Service: Gynecology;   Laterality: N/A;  . TUBAL LIGATION      OB History    Gravida Para Term Preterm AB Living   4         4   SAB TAB Ectopic Multiple Live Births           4       Home Medications    Prior to Admission medications   Medication Sig Start Date End Date Taking? Authorizing Provider  ibuprofen (ADVIL,MOTRIN) 800 MG tablet Take 1 tablet (800 mg total) by mouth 3 (three) times daily. 11/13/16   Kandis Cocking A, CNM  lisinopril (PRINIVIL,ZESTRIL) 20 MG tablet Take 1 tablet (20 mg total) by mouth daily. 09/26/16   Shelly Bombard, MD  propranolol-hydrochlorothiazide (INDERIDE) 80-25 MG tablet Take 1 tablet by mouth daily. 09/26/16   Shelly Bombard, MD    Family History Family History  Problem Relation Age of Onset  . Diabetes Other   . Hypertension Other     Social History Social History  Substance Use Topics  . Smoking status: Current Every Day Smoker    Types: Cigarettes    Last attempt to quit: 07/15/2014  . Smokeless tobacco: Never Used  . Alcohol use No     Comment: socially     Allergies   Percocet [oxycodone-acetaminophen] and Tramadol   Review of Systems Review of Systems  Constitutional: Negative for chills and fever.  HENT: Negative for congestion and rhinorrhea.   Eyes: Negative for redness and visual disturbance.  Respiratory: Positive for shortness of breath. Negative for wheezing.   Cardiovascular: Positive for chest pain. Negative for palpitations.  Gastrointestinal:  Negative for nausea and vomiting.  Genitourinary: Negative for dysuria and urgency.  Musculoskeletal: Negative for arthralgias and myalgias.  Skin: Negative for pallor and wound.  Neurological: Negative for dizziness and headaches.     Physical Exam Updated Vital Signs BP 119/82 (BP Location: Left Arm)   Pulse 68   Temp 98.5 F (36.9 C) (Oral)   Resp 18   LMP 04/28/2017 (Exact Date)   SpO2 100%   Physical Exam  Constitutional: She is oriented to person, place, and time.  She appears well-developed and well-nourished. No distress.  HENT:  Head: Normocephalic and atraumatic.  Eyes: Pupils are equal, round, and reactive to light. EOM are normal.  Neck: Normal range of motion. Neck supple.  Cardiovascular: Normal rate and regular rhythm.  Exam reveals no gallop and no friction rub.   No murmur heard. Pulmonary/Chest: Effort normal. She has no wheezes. She has no rales.  Abdominal: Soft. She exhibits no distension. There is no tenderness.  Musculoskeletal: She exhibits no edema or tenderness.  Neurological: She is alert and oriented to person, place, and time.  Skin: Skin is warm and dry. She is not diaphoretic.  Psychiatric: She has a normal mood and affect. Her behavior is normal.  Nursing note and vitals reviewed.    ED Treatments / Results   DIAGNOSTIC STUDIES: Oxygen Saturation is 100% on RA, normal by my interpretation.   COORDINATION OF CARE: 12:05 AM-Discussed next steps with pt. Pt verbalized understanding and is agreeable with the plan.   Labs (all labs ordered are listed, but only abnormal results are displayed) Labs Reviewed  BASIC METABOLIC PANEL - Abnormal; Notable for the following:       Result Value   Potassium 3.1 (*)    All other components within normal limits  CBC - Abnormal; Notable for the following:    RBC 5.67 (*)    MCV 71.1 (*)    MCH 23.6 (*)    All other components within normal limits  I-STAT TROPONIN, ED  POCT I-STAT TROPONIN I    EKG  EKG Interpretation  Date/Time:  Wednesday May 15 2017 22:31:15 EDT Ventricular Rate:  81 PR Interval:  152 QRS Duration: 70 QT Interval:  346 QTC Calculation: 401 R Axis:   54 Text Interpretation:  Normal sinus rhythm Biatrial enlargement Nonspecific T wave abnormality Abnormal ECG since last tracing no significant change Confirmed by Daleen Bo [phone]) on 05/15/2017 11:26:34 PM       Radiology Dg Chest 2 View  Result Date: 05/15/2017 CLINICAL DATA:  Mid chest  pain since this afternoon, worse tonight. History of smoking. EXAM: CHEST  2 VIEW COMPARISON:  04/23/2015 FINDINGS: The heart size and mediastinal contours are within normal limits. Both lungs are clear. The visualized skeletal structures are unremarkable. IMPRESSION: No active cardiopulmonary disease. Electronically Signed   By: Ashley Royalty M.D.   On: 05/15/2017 23:41    Procedures Procedures (including critical care time)  Medications Ordered in ED Medications - No data to display   Initial Impression / Assessment and Plan / ED Course  I have reviewed the triage vital signs and the nursing notes.  Pertinent labs & imaging results that were available during my care of the patient were reviewed by me and considered in my medical decision making (see chart for details).     41 yo F with chest pain. Mostly atypical in nature with the patient does have some shortness of breath and exertional component with it. Going on since  this morning. Troponin EKG chest x-ray unremarkable. I discussed results with the patient. With exertional symptoms and her history of smoking hypertension and hyperlipidemia discussed that typically to a delta. She is refusing this at this time. She understands the implications. And will follow-up with her family physician.  Discussed smoking cessation with patient and was they were offerred resources to help stop.  Total time was 5 min CPT code 99406.   1:30 AM:  I have discussed the diagnosis/risks/treatment options with the patient and believe the pt to be eligible for discharge home to follow-up with PCP. We also discussed returning to the ED immediately if new or worsening sx occur. We discussed the sx which are most concerning (e.g., sudden worsening pain, fever, inability to tolerate by mouth) that necessitate immediate return. Medications administered to the patient during their visit and any new prescriptions provided to the patient are listed below.  Medications  given during this visit Medications - No data to display   The patient appears reasonably screen and/or stabilized for discharge and I doubt any other medical condition or other [facility] requiring further screening, evaluation, or treatment in the ED at this time prior to discharge.    Final Clinical Impressions(s) / ED Diagnoses   Final diagnoses:  Atypical chest pain    New Prescriptions Discharge Medication List as of 05/16/2017 12:38 AM      I personally performed the services described in this documentation, which was scribed in my presence. The recorded information has been reviewed and is accurate.     Deno Etienne, DO 05/16/17 0130

## 2017-05-15 NOTE — ED Notes (Signed)
Pt. Documented in error DG Chest 2 View. 

## 2017-05-16 NOTE — ED Notes (Addendum)
Pt was seen by MD Tyrone Nine and cleared medically.  Pt put up for d/c but did not wait for paperwork.  MD Tyrone Nine discussed d/c and follow up instructions w/patient.

## 2017-05-16 NOTE — Discharge Instructions (Signed)
Take 4 over the counter ibuprofen tablets 3 times a day or 2 over-the-counter naproxen tablets twice a day for pain. Also take tylenol 1000mg(2 extra strength) four times a day.    

## 2017-05-16 NOTE — ED Notes (Signed)
Pt left [place] notified

## 2017-11-04 ENCOUNTER — Emergency Department (HOSPITAL_COMMUNITY): Payer: Self-pay

## 2017-11-04 ENCOUNTER — Other Ambulatory Visit: Payer: Self-pay

## 2017-11-04 ENCOUNTER — Encounter (HOSPITAL_COMMUNITY): Payer: Self-pay

## 2017-11-04 ENCOUNTER — Emergency Department (HOSPITAL_COMMUNITY)
Admission: EM | Admit: 2017-11-04 | Discharge: 2017-11-04 | Disposition: A | Discharge status: Home / Self Care | Payer: Self-pay | Attending: Emergency Medicine | Admitting: Emergency Medicine

## 2017-11-04 DIAGNOSIS — F17210 Nicotine dependence, cigarettes, uncomplicated: Secondary | ICD-10-CM | POA: Insufficient documentation

## 2017-11-04 DIAGNOSIS — I10 Essential (primary) hypertension: Secondary | ICD-10-CM | POA: Insufficient documentation

## 2017-11-04 DIAGNOSIS — J069 Acute upper respiratory infection, unspecified: Secondary | ICD-10-CM | POA: Insufficient documentation

## 2017-11-04 DIAGNOSIS — Z79899 Other long term (current) drug therapy: Secondary | ICD-10-CM | POA: Insufficient documentation

## 2017-11-04 LAB — CBC
HCT: 41.3 % (ref 36.0–46.0)
Hemoglobin: 13 g/dL (ref 12.0–15.0)
MCH: 22.9 pg — ABNORMAL LOW (ref 26.0–34.0)
MCHC: 31.5 g/dL (ref 30.0–36.0)
MCV: 72.8 fL — ABNORMAL LOW (ref 78.0–100.0)
PLATELETS: 317 10*3/uL (ref 150–400)
RBC: 5.67 MIL/uL — AB (ref 3.87–5.11)
RDW: 14.5 % (ref 11.5–15.5)
WBC: 6 10*3/uL (ref 4.0–10.5)

## 2017-11-04 LAB — I-STAT TROPONIN, ED: TROPONIN I, POC: 0 ng/mL (ref 0.00–0.08)

## 2017-11-04 LAB — BASIC METABOLIC PANEL
Anion gap: 7 (ref 5–15)
BUN: 9 mg/dL (ref 6–20)
CALCIUM: 8.6 mg/dL — AB (ref 8.9–10.3)
CO2: 21 mmol/L — ABNORMAL LOW (ref 22–32)
CREATININE: 0.58 mg/dL (ref 0.44–1.00)
Chloride: 108 mmol/L (ref 101–111)
Glucose, Bld: 88 mg/dL (ref 65–99)
Potassium: 3.8 mmol/L (ref 3.5–5.1)
SODIUM: 136 mmol/L (ref 135–145)

## 2017-11-04 LAB — I-STAT BETA HCG BLOOD, ED (MC, WL, AP ONLY): I-stat hCG, quantitative: <5 m[IU]/mL (ref <5)

## 2017-11-04 NOTE — ED Notes (Signed)
Pt in xray

## 2017-11-04 NOTE — ED Triage Notes (Signed)
Pt states this morning at 1000 while at work she began feeling dizzy, experiencing central chest pain that radiated to back, and having diarrhea. Pt endorses sob and nausea.

## 2017-11-04 NOTE — ED Provider Notes (Signed)
[facility] EMERGENCY DEPARTMENT Provider Note   CSN: [id number] Arrival date & time: 11/04/17  1146     History   Chief Complaint Chief Complaint  Patient presents with  . Chest Pain    HPI Brianna Gutierrez is a 42 y.o. female.  HPI Patient presents with chest pain cough sore throat head congestion and diarrhea.  Also had some shortness of breath and nausea.  States she has some family members that have cough.  No fevers.  Mild shortness of breath.  Chest pain is dull.  States she aches all over.  History of hypertension. Past Medical History:  Diagnosis Date  . Borderline diabetes   . Hypertension     Patient Active Problem List   Diagnosis Date Noted  . Pre-diabetes 11/15/2016  . Uterine fibroid 04/27/2016  . Abnormal uterine bleeding (AUB) 09/28/2015  . History of bilateral tubal ligation 09/28/2015  . Tobacco abuse 09/28/2015    Past Surgical History:  Procedure Laterality Date  . CESAREAN SECTION    . cesarian    . DILITATION & CURRETTAGE/HYSTROSCOPY WITH HYDROTHERMAL ABLATION N/A 06/22/2016   Procedure: DILATATION & CURETTAGE/HYSTEROSCOPY WITH HYDROTHERMAL ABLATION;  Surgeon: Shelly Bombard, MD;  Location: [facility];  Service: Gynecology;  Laterality: N/A;  . TUBAL LIGATION      OB History    Gravida Para Term Preterm AB Living   4         4   SAB TAB Ectopic Multiple Live Births           4       Home Medications    Prior to Admission medications   Medication Sig Start Date End Date Taking? Authorizing Provider  ibuprofen (ADVIL,MOTRIN) 800 MG tablet Take 1 tablet (800 mg total) by mouth 3 (three) times daily. 11/13/16   Kandis Cocking A, CNM  lisinopril (PRINIVIL,ZESTRIL) 20 MG tablet Take 1 tablet (20 mg total) by mouth daily. 09/26/16   Shelly Bombard, MD  propranolol-hydrochlorothiazide (INDERIDE) 80-25 MG tablet Take 1 tablet by mouth daily. 09/26/16   Shelly Bombard, MD    Family History Family History  Problem Relation  Age of Onset  . Diabetes Other   . Hypertension Other     Social History Social History   Tobacco Use  . Smoking status: Current Every Day Smoker    Types: Cigarettes    Last attempt to quit: 07/15/2014    Years since quitting: 3.3  . Smokeless tobacco: Never Used  Substance Use Topics  . Alcohol use: No    Alcohol/week: 0.0 oz    Comment: socially  . Drug use: No     Allergies   Percocet [oxycodone-acetaminophen] and Tramadol   Review of Systems Review of Systems  Constitutional: Positive for appetite change and fatigue.  HENT: Positive for congestion and sore throat.   Respiratory: Positive for cough.   Cardiovascular: Negative for chest pain.  Gastrointestinal: Positive for diarrhea.  Genitourinary: Negative for flank pain.  Musculoskeletal: Positive for myalgias.  Skin: Negative for rash and wound.  Neurological: Negative for seizures.  Hematological: Negative for adenopathy.  Psychiatric/Behavioral: Negative for confusion.     Physical Exam Updated Vital Signs BP (!) 137/91 (BP Location: Right Arm)   Pulse 73   Temp 98.4 F (36.9 C) (Oral)   Resp 16   Ht 4\' 9"  (1.448 m)   Wt 71.2 kg (157 lb)   LMP 10/12/2017   SpO2 100%   BMI 33.97 kg/m  Physical Exam  Constitutional: She appears well-developed.  HENT:  Head: Atraumatic.  Posterior pharyngeal erythema and some cobblestoning.  No exudate.  Uvula midline.  Cardiovascular: Normal rate and regular rhythm.  Pulmonary/Chest:  Slightly harsh breath sounds without focal rales or rhonchi.  Abdominal: Soft. There is no tenderness.  Musculoskeletal:       Right lower leg: She exhibits no edema.       Left lower leg: She exhibits no edema.  Neurological: She is alert.  Skin: Skin is warm. Capillary refill takes less than 2 seconds.  Psychiatric: She has a normal mood and affect.     ED Treatments / Results  Labs (all labs ordered are listed, but only abnormal results are displayed) Labs Reviewed    BASIC METABOLIC PANEL - Abnormal; Notable for the following components:      Result Value   CO2 21 (*)    Calcium 8.6 (*)    All other components within normal limits  CBC - Abnormal; Notable for the following components:   RBC 5.67 (*)    MCV 72.8 (*)    MCH 22.9 (*)    All other components within normal limits  I-STAT TROPONIN, ED  I-STAT BETA HCG BLOOD, ED ([facility], [facility], [facility] ONLY)    EKG  EKG Interpretation  Date/Time:  Monday November 04 2017 11:53:36 EST Ventricular Rate:  82 PR Interval:  146 QRS Duration: 78 QT Interval:  340 QTC Calculation: 397 R Axis:   71 Text Interpretation:  Normal sinus rhythm Right atrial enlargement Nonspecific ST abnormality Abnormal ECG Confirmed by Davonna Belling [phone]) on 11/04/2017 12:57:08 PM       Radiology Dg Chest 2 View  Result Date: 11/04/2017 CLINICAL DATA:  Dizziness and chest pain EXAM: CHEST  2 VIEW COMPARISON:  05/15/17 FINDINGS: The heart size and mediastinal contours are within normal limits. Both lungs are clear. The visualized skeletal structures are unremarkable. IMPRESSION: No active cardiopulmonary disease. Electronically Signed   By: Inez Catalina M.D.   On: 11/04/2017 12:31    Procedures Procedures (including critical care time)  Medications Ordered in ED Medications - No data to display   Initial Impression / Assessment and Plan / ED Course  I have reviewed the triage vital signs and the nursing notes.  Pertinent labs & imaging results that were available during my care of the patient were reviewed by me and considered in my medical decision making (see chart for details).     Patient with URI symptoms.  Sore throat.  Also having some diarrhea.  Protocol had been done an x-ray and lab work reassuring.  Will discharge home.  Doubt strep.  Will discharge home with over-the-counter treatment.  Final Clinical Impressions(s) / ED Diagnoses   Final diagnoses:  Upper respiratory tract infection, unspecified type     ED Discharge Orders    None       Davonna Belling, MD 11/04/17 1315

## 2017-11-05 ENCOUNTER — Other Ambulatory Visit: Payer: Self-pay | Admitting: Obstetrics

## 2017-11-05 DIAGNOSIS — I10 Essential (primary) hypertension: Secondary | ICD-10-CM

## 2017-11-13 ENCOUNTER — Ambulatory Visit: Payer: Self-pay | Admitting: Obstetrics

## 2017-11-20 ENCOUNTER — Ambulatory Visit: Payer: Self-pay | Admitting: Obstetrics

## 2018-10-24 ENCOUNTER — Ambulatory Visit
Admission: EM | Admit: 2018-10-24 | Discharge: 2018-10-24 | Disposition: A | Discharge status: Home / Self Care | Payer: Self-pay | Attending: Emergency Medicine | Admitting: Emergency Medicine

## 2018-10-24 ENCOUNTER — Encounter: Payer: Self-pay | Admitting: Emergency Medicine

## 2018-10-24 DIAGNOSIS — Z3202 Encounter for pregnancy test, result negative: Secondary | ICD-10-CM

## 2018-10-24 DIAGNOSIS — Z1389 Encounter for screening for other disorder: Secondary | ICD-10-CM

## 2018-10-24 DIAGNOSIS — B9789 Other viral agents as the cause of diseases classified elsewhere: Secondary | ICD-10-CM | POA: Insufficient documentation

## 2018-10-24 DIAGNOSIS — J069 Acute upper respiratory infection, unspecified: Secondary | ICD-10-CM | POA: Insufficient documentation

## 2018-10-24 DIAGNOSIS — I10 Essential (primary) hypertension: Secondary | ICD-10-CM | POA: Insufficient documentation

## 2018-10-24 DIAGNOSIS — L02214 Cutaneous abscess of groin: Secondary | ICD-10-CM | POA: Insufficient documentation

## 2018-10-24 LAB — POCT URINE PREGNANCY: PREG TEST UR: NEGATIVE

## 2018-10-24 LAB — POCT URINALYSIS DIP (MANUAL ENTRY)
BILIRUBIN UA: NEGATIVE mg/dL
Bilirubin, UA: NEGATIVE
Glucose, UA: NEGATIVE mg/dL
LEUKOCYTES UA: NEGATIVE
NITRITE UA: NEGATIVE
PH UA: 6 (ref 5.0–8.0)
Protein Ur, POC: 100 mg/dL — AB
Spec Grav, UA: 1.025 (ref 1.010–1.025)
UROBILINOGEN UA: 0.2 U/dL

## 2018-10-24 MED ORDER — BENZONATATE 200 MG PO CAPS: 200.0000 mg | ORAL_CAPSULE | Freq: Three times a day (TID) | ORAL | 0 refills | Status: AC | PRN

## 2018-10-24 MED ORDER — CETIRIZINE HCL 10 MG PO CAPS: 10.0000 mg | ORAL_CAPSULE | Freq: Every day | ORAL | 0 refills | Status: DC

## 2018-10-24 MED ORDER — FLUTICASONE PROPIONATE 50 MCG/ACT NA SUSP: 1.0000 | Freq: Every day | NASAL | 2 refills | Status: DC

## 2018-10-24 MED ORDER — LISINOPRIL 20 MG PO TABS: 20.0000 mg | ORAL_TABLET | Freq: Every day | ORAL | 0 refills | Status: DC

## 2018-10-24 MED ORDER — PROPRANOLOL-HCTZ 80-25 MG PO TABS: 1.0000 | ORAL_TABLET | Freq: Every day | ORAL | 0 refills | Status: DC

## 2018-10-24 MED ORDER — AMOXICILLIN-POT CLAVULANATE 875-125 MG PO TABS: 1.0000 | ORAL_TABLET | Freq: Two times a day (BID) | ORAL | 0 refills | Status: AC

## 2018-10-24 NOTE — Discharge Instructions (Addendum)
ABSCESS Please begin taking Augmentin twice daily to treat abscess in groin Apply warm compresses or soaks in hot water with gentle massage to express extra drainage Please follow-up if symptoms worsening, developing increased swelling or pain URI The symptoms are most likely viral in need another 3 to 4 days to fully resolve Please begin taking daily allergy pill like Zyrtec or Claritin daily to help with congestion and drainage, please.  Flonase nasal spray 1 to 2 sprays in each nostril daily.  You may get these over-the-counter and get generic version if cheaper. Tessalon for cough or may use over-the-counter Delsym Honey and lemon mixed in tea to help with voice and discomfort in throat Blood Pressure I have refilled your medicine Your blood pressure was elevated today in clinic. Please be sure to take blood pressure medications as prescribed. Please monitor your blood pressure at home or when you go to a [facility]. Please follow up with your primary care doctor to recheck blood pressure and discuss any need for medication changes.   Please go to Emergency Room if you start to experience severe headache, vision changes, decreased urine production, chest pain, shortness of breath, speech slurring, one sided weakness.

## 2018-10-24 NOTE — ED Triage Notes (Signed)
Pt also would like a growth on her right groin to be looked at.  States her doctor had her apply cream, but it did not improve.  States she has also not been able to get a refill on her BP medications due to not having a PCP at this time.  Patient also c/o lower abdominal pressure, frequency.

## 2018-10-24 NOTE — ED Notes (Signed)
Patient able to ambulate independently  

## 2018-10-24 NOTE — ED Triage Notes (Signed)
Pt presents to [facility] for assessment of 1 week of nasal congestion, drainage, and hoarse voice.

## 2018-10-24 NOTE — ED Provider Notes (Signed)
[name]    CSN: [id number] Arrival date & time: 10/24/18  1915     History   Chief Complaint Chief Complaint  Patient presents with  . URI    HPI Brianna Gutierrez is a 42 y.o. female history of hypertension, tobacco use presenting today for evaluation of multiple complaints.  Her main complaint is that she has had nasal congestion and drainage with change in voice.  Symptoms have been going on for approximately 4 to 5 days.  She has had throat irritation and sensation of mucus in her throat.  She has been coughing this up frequently.  She has tried taking Mucinex and Vicks without relief, she has been applying a hot rag to her throat.  Denies any fevers.  She has also had some associated abdominal aching and cramping.  She has had looser stools and more frequent stools.  Denies blood in stool.  Denies nausea or vomiting.  Denies increased urinary frequency or dysuria.  Denies abnormal discharge.  Patient does note she has a history of cyst on 1 of her ovaries.  Last menstrual.  Was last month, but has a history of previous ablation and tubal ligation.  She is concerned as she has had a abscess to her right groin.  This is been there over the past month, it is flared up and down at times.  She has been applying a cream without relief.  Occasionally with spontaneous drainage.  HPI  Past Medical History:  Diagnosis Date  . Borderline diabetes   . Hypertension     Patient Active Problem List   Diagnosis Date Noted  . Pre-diabetes 11/15/2016  . Uterine fibroid 04/27/2016  . Abnormal uterine bleeding (AUB) 09/28/2015  . History of bilateral tubal ligation 09/28/2015  . Tobacco abuse 09/28/2015    Past Surgical History:  Procedure Laterality Date  . CESAREAN SECTION    . cesarian    . DILITATION & CURRETTAGE/HYSTROSCOPY WITH HYDROTHERMAL ABLATION N/A 06/22/2016   Procedure: DILATATION & CURETTAGE/HYSTEROSCOPY WITH HYDROTHERMAL ABLATION;  Surgeon: Shelly Bombard, MD;   Location: [facility];  Service: Gynecology;  Laterality: N/A;  . TUBAL LIGATION      OB History    Gravida  4   Para      Term      Preterm      AB      Living  4     SAB      TAB      Ectopic      Multiple      Live Births  4            Home Medications    Prior to Admission medications   Medication Sig Start Date End Date Taking? Authorizing Provider  amoxicillin-clavulanate (AUGMENTIN) 875-125 MG tablet Take 1 tablet by mouth every 12 (twelve) hours for 7 days. 10/24/18 10/31/18  Makhia Vosler C, PA-C  benzonatate (TESSALON) 200 MG capsule Take 1 capsule (200 mg total) by mouth 3 (three) times daily as needed for up to 7 days for cough. 10/24/18 10/31/18  Alexea Blase C, PA-C  Cetirizine HCl 10 MG CAPS Take 1 capsule (10 mg total) by mouth daily for 10 days. 10/24/18 11/03/18  Jazon Jipson C, PA-C  fluticasone (FLONASE) 50 MCG/ACT nasal spray Place 1-2 sprays into both nostrils daily. 10/24/18   Azavion Bouillon C, PA-C  ibuprofen (ADVIL,MOTRIN) 800 MG tablet Take 1 tablet (800 mg total) by mouth 3 (three) times daily. 11/13/16  Denney, Rachelle A, CNM  lisinopril (PRINIVIL,ZESTRIL) 20 MG tablet Take 1 tablet (20 mg total) by mouth daily. 10/24/18   Labron Bloodgood C, PA-C  propranolol-hydrochlorothiazide (INDERIDE) 80-25 MG tablet Take 1 tablet by mouth daily. 10/24/18   Jariana Shumard, Elesa Hacker, PA-C    Family History Family History  Problem Relation Age of Onset  . Diabetes Other   . Hypertension Other     Social History Social History   Tobacco Use  . Smoking status: Current Every Day Smoker    Types: Cigarettes    Last attempt to quit: 07/15/2014    Years since quitting: 4.2  . Smokeless tobacco: Never Used  Substance Use Topics  . Alcohol use: No    Alcohol/week: 0.0 standard drinks    Comment: socially  . Drug use: No     Allergies   Percocet [oxycodone-acetaminophen] and Tramadol   Review of Systems Review of Systems  Constitutional:  Negative for activity change, appetite change, chills, fatigue and fever.  HENT: Positive for congestion, rhinorrhea, sore throat and voice change. Negative for ear pain, sinus pressure and trouble swallowing.   Eyes: Negative for discharge and redness.  Respiratory: Positive for cough. Negative for chest tightness and shortness of breath.   Cardiovascular: Negative for chest pain.  Gastrointestinal: Positive for abdominal pain and diarrhea. Negative for nausea and vomiting.  Genitourinary: Positive for genital sores. Negative for dysuria, flank pain, hematuria, menstrual problem, vaginal bleeding, vaginal discharge and vaginal pain.  Musculoskeletal: Negative for back pain and myalgias.  Skin: Positive for color change. Negative for rash.  Neurological: Negative for dizziness, light-headedness and headaches.     Physical Exam Triage Vital Signs ED Triage Vitals [10/24/18 1921]  Enc Vitals Group     BP (!) 163/116     Pulse Rate 79     Resp 16     Temp 98 F (36.7 C)     Temp Source Oral     SpO2 98 %     Weight      Height      Head Circumference      Peak Flow      Pain Score 4     Pain Loc      Pain Edu?      Excl. in [place]?    No data found.  Updated Vital Signs BP (!) 163/116 (BP Location: Left Arm)   Pulse 79   Temp 98 F (36.7 C) (Oral)   Resp 16   SpO2 98%   Visual Acuity Right Eye Distance:   Left Eye Distance:   Bilateral Distance:    Right Eye Near:   Left Eye Near:    Bilateral Near:     Physical Exam Vitals signs and nursing note reviewed.  Constitutional:      General: She is not in acute distress.    Appearance: She is well-developed.  HENT:     Head: Normocephalic and atraumatic.     Ears:     Comments: Bilateral ears without tenderness to palpation of external auricle, tragus and mastoid, EAC's without erythema or swelling, TM's with good bony landmarks and cone of light. Non erythematous.    Nose:     Comments: Nasal mucosa erythematous,  rhinorrhea present bilaterally    Mouth/Throat:     Comments: Oral mucosa pink and moist, no tonsillar enlargement or exudate. Posterior pharynx patent and erythematous-cobblestoning present, no uvula deviation or swelling.  Hoarse voice, slightly sounds congested Eyes:  Conjunctiva/sclera: Conjunctivae normal.  Neck:     Musculoskeletal: Neck supple.  Cardiovascular:     Rate and Rhythm: Normal rate and regular rhythm.     Heart sounds: No murmur.  Pulmonary:     Effort: Pulmonary effort is normal. No respiratory distress.     Breath sounds: Normal breath sounds.     Comments: Breathing comfortably at rest, CTABL, no wheezing, rales or other adventitious sounds auscultated Abdominal:     Palpations: Abdomen is soft.     Tenderness: There is no abdominal tenderness.     Comments: Soft, nondistended, nontender to light deep palpation throughout abdomen  Skin:    General: Skin is warm and dry.     Comments: Small 1 cm area of induration to right groin, actively draining  Neurological:     Mental Status: She is alert.      UC Treatments / Results  Labs (all labs ordered are listed, but only abnormal results are displayed) Labs Reviewed  POCT URINALYSIS DIP (MANUAL ENTRY) - Abnormal; Notable for the following components:      Result Value   Blood, UA trace-intact (*)    Protein Ur, POC =100 (*)    All other components within normal limits  POCT URINE PREGNANCY    EKG None  Radiology No results found.  Procedures Procedures (including critical care time)  Medications Ordered in UC Medications - No data to display  Initial Impression / Assessment and Plan / UC Course  I have reviewed the triage vital signs and the nursing notes.  Pertinent labs & imaging results that were available during my care of the patient were reviewed by me and considered in my medical decision making (see chart for details).     Patient with abscess to right groin, actively draining,  small, does not warrant I&D at this time.  Will initiate on Augmentin to help decrease symptoms, warm compresses.  URI symptoms most likely viral, vital signs stable, exam nonfocal.  Most likely postnasal drainage causing symptoms.  Will initiate on daily allergy pill, Flonase nasal spray.  Tessalon for cough.  Blood pressure elevated today.  Patient asymptomatic.  Will refill lisinopril and propanolol/HCTZ combo.  Discussed with patient to continue to monitor her blood pressure.  Discussed signs and symptoms to go to ED for.    Discussed strict return precautions. Patient verbalized understanding and is agreeable with plan.  Final Clinical Impressions(s) / UC Diagnoses   Final diagnoses:  Viral URI with cough  Groin abscess  Essential hypertension     Discharge Instructions     ABSCESS Please begin taking Augmentin twice daily to treat abscess in groin Apply warm compresses or soaks in hot water with gentle massage to express extra drainage Please follow-up if symptoms worsening, developing increased swelling or pain URI The symptoms are most likely viral in need another 3 to 4 days to fully resolve Please begin taking daily allergy pill like Zyrtec or Claritin daily to help with congestion and drainage, please.  Flonase nasal spray 1 to 2 sprays in each nostril daily.  You may get these over-the-counter and get generic version if cheaper. Tessalon for cough or may use over-the-counter Delsym Honey and lemon mixed in tea to help with voice and discomfort in throat Blood Pressure I have refilled your medicine Your blood pressure was elevated today in clinic. Please be sure to take blood pressure medications as prescribed. Please monitor your blood pressure at home or when you go to a  [facility]. Please follow up with your primary care doctor to recheck blood pressure and discuss any need for medication changes.   Please go to Emergency Room if you start to experience severe  headache, vision changes, decreased urine production, chest pain, shortness of breath, speech slurring, one sided weakness.   ED Prescriptions    Medication Sig Dispense Auth. Provider   amoxicillin-clavulanate (AUGMENTIN) 875-125 MG tablet Take 1 tablet by mouth every 12 (twelve) hours for 7 days. 14 tablet Maryfer Tauzin C, PA-C   Cetirizine HCl 10 MG CAPS Take 1 capsule (10 mg total) by mouth daily for 10 days. 10 capsule Donna Silverman C, PA-C   fluticasone (FLONASE) 50 MCG/ACT nasal spray Place 1-2 sprays into both nostrils daily. 1 g Eddison Searls C, PA-C   benzonatate (TESSALON) 200 MG capsule Take 1 capsule (200 mg total) by mouth 3 (three) times daily as needed for up to 7 days for cough. 28 capsule Amylee Lodato C, PA-C   lisinopril (PRINIVIL,ZESTRIL) 20 MG tablet Take 1 tablet (20 mg total) by mouth daily. 30 tablet Detron Carras C, PA-C   propranolol-hydrochlorothiazide (INDERIDE) 80-25 MG tablet Take 1 tablet by mouth daily. 30 tablet Ylonda Storr, Frederick C, PA-C     Controlled Substance Prescriptions [place] Controlled Substance Registry consulted? Not Applicable   Janith Lima, [place] 10/24/18 [phone]

## 2018-11-05 ENCOUNTER — Telehealth: Payer: Self-pay

## 2018-11-05 MED ORDER — FLUCONAZOLE 150 MG PO TABS: 150.0000 mg | ORAL_TABLET | Freq: Every day | ORAL | 0 refills | Status: DC

## 2018-11-05 NOTE — Telephone Encounter (Signed)
Pt called in requesting medication for yeast infection after taking antibiotics

## 2018-12-13 ENCOUNTER — Encounter (HOSPITAL_COMMUNITY): Payer: Self-pay | Admitting: *Deleted

## 2018-12-13 ENCOUNTER — Other Ambulatory Visit: Payer: Self-pay

## 2018-12-13 ENCOUNTER — Ambulatory Visit (HOSPITAL_COMMUNITY)
Admission: EM | Admit: 2018-12-13 | Discharge: 2018-12-13 | Disposition: A | Discharge status: Home / Self Care | Payer: Self-pay | Attending: Family Medicine | Admitting: Family Medicine

## 2018-12-13 DIAGNOSIS — R1011 Right upper quadrant pain: Secondary | ICD-10-CM

## 2018-12-13 DIAGNOSIS — I10 Essential (primary) hypertension: Secondary | ICD-10-CM

## 2018-12-13 MED ORDER — LISINOPRIL-HYDROCHLOROTHIAZIDE 20-12.5 MG PO TABS: 1.0000 | ORAL_TABLET | Freq: Every day | ORAL | 1 refills | Status: DC

## 2018-12-13 MED ORDER — ONDANSETRON HCL 4 MG PO TABS: 4.0000 mg | ORAL_TABLET | Freq: Four times a day (QID) | ORAL | 0 refills | Status: DC

## 2018-12-13 MED ORDER — HYDROCODONE-ACETAMINOPHEN 5-325 MG PO TABS: 1.0000 | ORAL_TABLET | ORAL | 0 refills | Status: DC | PRN

## 2018-12-13 NOTE — ED Provider Notes (Signed)
[facility]    CSN: [id number] Arrival date & time: 12/13/18  1056     History   Chief Complaint Chief Complaint  Patient presents with  . Abdominal Pain    HPI Brianna Gutierrez is a 43 y.o. female.   HPI  Patient is here for right upper quadrant pain.  She is been having waves of pain since yesterday.  She is having nausea.  She has had no vomiting.  She is eating normally today.  No fever or chills.  Normal bowel movement this morning.  No history of any stomach or bowel problems, irritable bowel, ulcers.  She states that the first wave of pain came on as she was awakening in the morning.  She also had waves of pain after eating.  She cannot tell that foods make any difference.  She did not eat anything out of the ordinary yesterday. Patient is supposed to be on blood pressure medicine.  She currently does not have a PCP.  She is out of her medications.  I expressed to her the importance of taking blood pressure medication every day.  I will refill her blood pressure medicine and give her referral to a PCP.  Past Medical History:  Diagnosis Date  . Borderline diabetes   . Hypertension     Patient Active Problem List   Diagnosis Date Noted  . Pre-diabetes 11/15/2016  . Uterine fibroid 04/27/2016  . Abnormal uterine bleeding (AUB) 09/28/2015  . History of bilateral tubal ligation 09/28/2015  . Tobacco abuse 09/28/2015    Past Surgical History:  Procedure Laterality Date  . CESAREAN SECTION    . cesarian    . DILITATION & CURRETTAGE/HYSTROSCOPY WITH HYDROTHERMAL ABLATION N/A 06/22/2016   Procedure: DILATATION & CURETTAGE/HYSTEROSCOPY WITH HYDROTHERMAL ABLATION;  Surgeon: Shelly Bombard, MD;  Location: [facility];  Service: Gynecology;  Laterality: N/A;  . TUBAL LIGATION      OB History    Gravida  4   Para      Term      Preterm      AB      Living  4     SAB      TAB      Ectopic      Multiple      Live Births  4            Home  Medications    Prior to Admission medications   Medication Sig Start Date End Date Taking? Authorizing Provider  HYDROcodone-acetaminophen (NORCO/VICODIN) 5-325 MG tablet Take 1 tablet by mouth every 4 (four) hours as needed for severe pain. 12/13/18   Raylene Everts, MD  lisinopril-hydrochlorothiazide (ZESTORETIC) 20-12.5 MG tablet Take 1 tablet by mouth daily. 12/13/18   Raylene Everts, MD  ondansetron (ZOFRAN) 4 MG tablet Take 1 tablet (4 mg total) by mouth every 6 (six) hours. 12/13/18   Raylene Everts, MD    Family History Family History  Problem Relation Age of Onset  . Diabetes Other   . Hypertension Other     Social History Social History   Tobacco Use  . Smoking status: Current Every Day Smoker    Types: Cigarettes    Last attempt to quit: 07/15/2014    Years since quitting: 4.4  . Smokeless tobacco: Never Used  Substance Use Topics  . Alcohol use: Not Currently    Comment: socially  . Drug use: No     Allergies   Percocet [oxycodone-acetaminophen] and Tramadol  Review of Systems Review of Systems  Constitutional: Negative for chills and fever.  HENT: Negative for ear pain and sore throat.   Eyes: Negative for pain and visual disturbance.  Respiratory: Negative for cough and shortness of breath.   Cardiovascular: Negative for chest pain and palpitations.  Gastrointestinal: Positive for abdominal pain and nausea. Negative for vomiting.  Genitourinary: Negative for dysuria and hematuria.  Musculoskeletal: Negative for arthralgias and back pain.  Skin: Negative for color change and rash.  Neurological: Negative for seizures and syncope.  All other systems reviewed and are negative.    Physical Exam Triage Vital Signs ED Triage Vitals  Enc Vitals Group     BP 12/13/18 1150 (!) 148/116     Pulse Rate 12/13/18 1150 82     Resp 12/13/18 1150 16     Temp 12/13/18 1150 97.9 F (36.6 C)     Temp Source 12/13/18 1150 Oral     SpO2 12/13/18 1150 99  %     Weight --      Height --      Head Circumference --      Peak Flow --      Pain Score 12/13/18 1151 5     Pain Loc --      Pain Edu? --      Excl. in [place]? --    No data found.  Updated Vital Signs BP (!) 148/116 Comment: states ran out of HTN med 1 wk ago  Pulse 82   Temp 97.9 F (36.6 C) (Oral)   Resp 16   LMP 12/01/2018 (Approximate)   SpO2 99%        Physical Exam Constitutional:      General: She is not in acute distress.    Appearance: She is well-developed. She is not ill-appearing.  HENT:     Head: Normocephalic and atraumatic.  Eyes:     Extraocular Movements: Extraocular movements intact.     Conjunctiva/sclera: Conjunctivae normal.     Pupils: Pupils are equal, round, and reactive to light.  Neck:     Musculoskeletal: Normal range of motion.  Cardiovascular:     Rate and Rhythm: Normal rate and regular rhythm.     Heart sounds: Normal heart sounds.  Pulmonary:     Effort: Pulmonary effort is normal. No respiratory distress.     Breath sounds: Normal breath sounds.  Abdominal:     General: Bowel sounds are normal. There is no distension.     Palpations: Abdomen is soft.     Tenderness: There is abdominal tenderness in the right upper quadrant. There is no guarding or rebound.     Comments: Moderate tenderness in right upper quadrant.  No organomegaly.  No guarding or rebound  Musculoskeletal: Normal range of motion.  Skin:    General: Skin is warm and dry.  Neurological:     General: No focal deficit present.     Mental Status: She is alert.  Psychiatric:        Mood and Affect: Mood normal.     Comments: Seems upset being asked to put down her cell phone      UC Treatments / Results  Labs (all labs ordered are listed, but only abnormal results are displayed) Labs Reviewed - No data to display  EKG None  Radiology No results found.  Procedures Procedures (including critical care time)  Medications Ordered in UC Medications - No  data to display  Initial Impression / Assessment and  Plan / UC Course  I have reviewed the triage vital signs and the nursing notes.  Pertinent labs & imaging results that were available during my care of the patient were reviewed by me and considered in my medical decision making (see chart for details).     Discussed that this could be a gallstone.  It is not acutely painful to require emergency removal.  Patient refused to go to the emergency room in any event.  I explained to her that she needs an ultrasound of her right upper quadrant electively.  In the meantime she should stay on a bland diet and drink plenty of fluids.  I have given her pain medicine to use in the meantime.  Follow-up should be with the PCP.  PCP referral is given Final Clinical Impressions(s) / UC Diagnoses   Final diagnoses:  Colicky RUQ abdominal pain  Essential hypertension     Discharge Instructions     Your right abdominal pain may be a gallstone Gallstone pain is made worse by eating any fried or fatty foods.  Stay on bland diet. It is not an emergency to remove right now, however, if you have worsening pain and vomiting you may need to go to the emergency room In order to get an ultrasound and additional testing you need to see a primary care doctor.  I have given you the name and number of a primary care doctor who is taking new patients regardless of insurance. I have refilled a blood pressure pill.  This is inexpensive at [employer].  Take 1 a day I have given you a limited number of pain pills to take if your abdominal pain is severe.  For moderate abdominal pain take over-the-counter ibuprofen or acetaminophen I have given you a nausea pill to take as needed    ED Prescriptions    Medication Sig Dispense Auth. Provider   lisinopril-hydrochlorothiazide (ZESTORETIC) 20-12.5 MG tablet Take 1 tablet by mouth daily. 30 tablet Raylene Everts, MD   ondansetron (ZOFRAN) 4 MG tablet Take 1 tablet (4 mg  total) by mouth every 6 (six) hours. 12 tablet Raylene Everts, MD   HYDROcodone-acetaminophen (NORCO/VICODIN) 5-325 MG tablet Take 1 tablet by mouth every 4 (four) hours as needed for severe pain. 10 tablet Raylene Everts, MD     Controlled Substance Prescriptions [place] Controlled Substance Registry consulted? Yes, I have consulted the [place] Controlled Substances Registry for this patient, and feel the risk/benefit ratio today is favorable for proceeding with this prescription for a controlled substance.     Raylene Everts, MD 12/13/18 [phone]

## 2018-12-13 NOTE — ED Triage Notes (Signed)
C/O starting with intermittent RUQ cramping since yesterday.  Had some nausea yesterday, which has improved.  Denies fevers.

## 2018-12-13 NOTE — Discharge Instructions (Addendum)
Your right abdominal pain may be a gallstone Gallstone pain is made worse by eating any fried or fatty foods.  Stay on bland diet. It is not an emergency to remove right now, however, if you have worsening pain and vomiting you may need to go to the emergency room In order to get an ultrasound and additional testing you need to see a primary care doctor.  I have given you the name and number of a primary care doctor who is taking new patients regardless of insurance. I have refilled a blood pressure pill.  This is inexpensive at [employer].  Take 1 a day I have given you a limited number of pain pills to take if your abdominal pain is severe.  For moderate abdominal pain take over-the-counter ibuprofen or acetaminophen I have given you a nausea pill to take as needed

## 2018-12-16 ENCOUNTER — Ambulatory Visit
Admission: EM | Admit: 2018-12-16 | Discharge: 2018-12-16 | Disposition: A | Discharge status: Home / Self Care | Payer: Self-pay | Attending: Family Medicine | Admitting: Family Medicine

## 2018-12-16 DIAGNOSIS — R10821 Right upper quadrant rebound abdominal tenderness: Secondary | ICD-10-CM

## 2018-12-16 MED ORDER — OMEPRAZOLE 20 MG PO CPDR: 20.0000 mg | DELAYED_RELEASE_CAPSULE | Freq: Every day | ORAL | 1 refills | Status: DC

## 2018-12-16 MED ORDER — HYDROCODONE-ACETAMINOPHEN 5-325 MG PO TABS: 1.0000 | ORAL_TABLET | ORAL | 0 refills | Status: DC | PRN

## 2018-12-16 NOTE — ED Triage Notes (Signed)
Patient complains of RUQ pain and nausea X 4 days. Patient was seen on Friday at another urgent care.

## 2018-12-16 NOTE — Discharge Instructions (Signed)
When pain worsens or persists so bad you cannot sleep, work, or eat; it is time to go to the emergency room  You need to get a primary care provider

## 2018-12-16 NOTE — ED Provider Notes (Signed)
[name]    CSN: [id number] Arrival date & time: 12/16/18  1621     History   Chief Complaint Chief Complaint  Patient presents with  . Abdominal Pain    HPI Brianna Gutierrez is a 43 y.o. female.   Patient continues to have RUQ postprandial pain.  No nausea or vomiting.  No fever or diarrhea.  Patient just came from work   Note from 3 days ago at [facility]: Patient is here for right upper quadrant pain.  She is been having waves of pain since yesterday.  She is having nausea.  She has had no vomiting.  She is eating normally today.  No fever or chills.  Normal bowel movement this morning.  No history of any stomach or bowel problems, irritable bowel, ulcers.  She states that the first wave of pain came on as she was awakening in the morning.  She also had waves of pain after eating.  She cannot tell that foods make any difference.  She did not eat anything out of the ordinary yesterday. Patient is supposed to be on blood pressure medicine.  She currently does not have a PCP.  She is out of her medications.  I expressed to her the importance of taking blood pressure medication every day.  I will refill her blood pressure medicine and give her referral to a PCP.  She was supposed to follow up with evaluation with her primary care provider.     Past Medical History:  Diagnosis Date  . Borderline diabetes   . Hypertension     Patient Active Problem List   Diagnosis Date Noted  . Pre-diabetes 11/15/2016  . Uterine fibroid 04/27/2016  . Abnormal uterine bleeding (AUB) 09/28/2015  . History of bilateral tubal ligation 09/28/2015  . Tobacco abuse 09/28/2015    Past Surgical History:  Procedure Laterality Date  . CESAREAN SECTION    . cesarian    . DILITATION & CURRETTAGE/HYSTROSCOPY WITH HYDROTHERMAL ABLATION N/A 06/22/2016   Procedure: DILATATION & CURETTAGE/HYSTEROSCOPY WITH HYDROTHERMAL ABLATION;  Surgeon: Shelly Bombard, MD;  Location: [facility];   Service: Gynecology;  Laterality: N/A;  . TUBAL LIGATION      OB History    Gravida  4   Para      Term      Preterm      AB      Living  4     SAB      TAB      Ectopic      Multiple      Live Births  4            Home Medications    Prior to Admission medications   Medication Sig Start Date End Date Taking? Authorizing Provider  HYDROcodone-acetaminophen (NORCO/VICODIN) 5-325 MG tablet Take 1 tablet by mouth every 4 (four) hours as needed for severe pain. 12/16/18   Robyn Haber, MD  lisinopril-hydrochlorothiazide (ZESTORETIC) 20-12.5 MG tablet Take 1 tablet by mouth daily. 12/13/18   Raylene Everts, MD  omeprazole (PRILOSEC) 20 MG capsule Take 1 capsule (20 mg total) by mouth daily. 12/16/18   Robyn Haber, MD  ondansetron (ZOFRAN) 4 MG tablet Take 1 tablet (4 mg total) by mouth every 6 (six) hours. 12/13/18   Raylene Everts, MD    Family History Family History  Problem Relation Age of Onset  . Diabetes Other   . Hypertension Other     Social History Social History  Tobacco Use  . Smoking status: Current Every Day Smoker    Types: Cigarettes    Last attempt to quit: 07/15/2014    Years since quitting: 4.4  . Smokeless tobacco: Never Used  Substance Use Topics  . Alcohol use: Not Currently    Comment: socially  . Drug use: No     Allergies   Percocet [oxycodone-acetaminophen] and Tramadol   Review of Systems Review of Systems   Physical Exam Triage Vital Signs ED Triage Vitals  Enc Vitals Group     BP      Pulse      Resp      Temp      Temp src      SpO2      Weight      Height      Head Circumference      Peak Flow      Pain Score      Pain Loc      Pain Edu?      Excl. in [place]?    No data found.  Updated Vital Signs BP (!) 141/93 (BP Location: Left Arm)   Pulse 80   Temp (!) 97.3 F (36.3 C) (Oral)   Resp 12   LMP 12/01/2018 (Approximate)   SpO2 98%    Physical Exam Vitals signs and nursing note  reviewed.  Constitutional:      Appearance: She is well-developed. She is obese. She is not ill-appearing or toxic-appearing.  HENT:     Head: Normocephalic.  Eyes:     General: No scleral icterus.    Extraocular Movements: Extraocular movements intact.  Cardiovascular:     Rate and Rhythm: Normal rate and regular rhythm.     Heart sounds: Normal heart sounds.  Pulmonary:     Effort: Pulmonary effort is normal.     Breath sounds: Normal breath sounds.  Abdominal:     General: Abdomen is protuberant. Bowel sounds are normal.     Palpations: Abdomen is soft.     Tenderness: There is abdominal tenderness. There is guarding and rebound.  Skin:    General: Skin is warm.  Neurological:     General: No focal deficit present.     Mental Status: She is alert.  Psychiatric:        Mood and Affect: Mood is anxious and depressed.      UC Treatments / Results  Labs (all labs ordered are listed, but only abnormal results are displayed) Labs Reviewed - No data to display  EKG None  Radiology No results found.  Procedures Procedures (including critical care time)  Medications Ordered in UC Medications - No data to display  Initial Impression / Assessment and Plan / UC Course  I have reviewed the triage vital signs and the nursing notes.  Pertinent labs & imaging results that were available during my care of the patient were reviewed by me and considered in my medical decision making (see chart for details).     Final Clinical Impressions(s) / UC Diagnoses   Final diagnoses:  Right upper quadrant abdominal tenderness with rebound tenderness     Discharge Instructions     When pain worsens or persists so bad you cannot sleep, work, or eat; it is time to go to the emergency room  You need to get a primary care provider    ED Prescriptions    Medication Sig Dispense Auth. Provider   HYDROcodone-acetaminophen (NORCO/VICODIN) 5-325 MG tablet Take 1 tablet  by mouth  every 4 (four) hours as needed for severe pain. 10 tablet Robyn Haber, MD   omeprazole (PRILOSEC) 20 MG capsule Take 1 capsule (20 mg total) by mouth daily. 30 capsule Robyn Haber, MD     Controlled Substance Prescriptions [place] Controlled Substance Registry consulted? No   Robyn Haber, MD 12/16/18 [phone]

## 2018-12-29 ENCOUNTER — Encounter: Payer: Self-pay | Admitting: Family Medicine

## 2018-12-30 ENCOUNTER — Encounter: Payer: Self-pay | Admitting: Family Medicine

## 2018-12-30 ENCOUNTER — Telehealth: Payer: Self-pay | Admitting: Family Medicine

## 2018-12-30 ENCOUNTER — Ambulatory Visit (INDEPENDENT_AMBULATORY_CARE_PROVIDER_SITE_OTHER): Payer: Self-pay | Admitting: Family Medicine

## 2018-12-30 VITALS — BP 91/63 | HR 102 | Temp 98.3°F | Resp 17 | Ht 59.0 in | Wt 162.0 lb

## 2018-12-30 DIAGNOSIS — Z1389 Encounter for screening for other disorder: Secondary | ICD-10-CM

## 2018-12-30 DIAGNOSIS — I10 Essential (primary) hypertension: Secondary | ICD-10-CM

## 2018-12-30 DIAGNOSIS — Z87898 Personal history of other specified conditions: Secondary | ICD-10-CM

## 2018-12-30 DIAGNOSIS — Z7689 Persons encountering health services in other specified circumstances: Secondary | ICD-10-CM

## 2018-12-30 DIAGNOSIS — R1011 Right upper quadrant pain: Secondary | ICD-10-CM

## 2018-12-30 DIAGNOSIS — F17210 Nicotine dependence, cigarettes, uncomplicated: Secondary | ICD-10-CM

## 2018-12-30 LAB — POCT URINALYSIS DIP (CLINITEK)
Bilirubin, UA: NEGATIVE
GLUCOSE UA: NEGATIVE mg/dL
Ketones, POC UA: NEGATIVE mg/dL
NITRITE UA: NEGATIVE
POC PROTEIN,UA: NEGATIVE
Spec Grav, UA: 1.025 (ref 1.010–1.025)
Urobilinogen, UA: 0.2 E.U./dL
pH, UA: 5 (ref 5.0–8.0)

## 2018-12-30 NOTE — Progress Notes (Signed)
Brianna Gutierrez, is a 43 y.o. female  LFY:[id number]  ENI:[id number]  DOB - November 27, 1975  CC:  Chief Complaint  Patient presents with  . Establish Care  . Follow-up    urgent care 2/15 & 2/18: RUQ abdominal pain       HPI: Brianna Gutierrez is a 43 y.o. female is here today to establish care.   Brianna Gutierrez has Abnormal uterine bleeding (AUB); History of bilateral tubal ligation; Tobacco abuse; Uterine fibroid; and Pre-diabetes on their problem list.   Abdominal Pain  Patient complains of RUQ abdominal pain.  Review of EMR patient has had multiple work-ups for complaints of varying forms of abdominal pain and varying quadrants.  Patient had an abdominal complete ultrasound completed in 09/2011. Imaging benign for any acute findings with the exception of right kidney smaller than left. On 08/2015 transvaginal and pelvic ultrasounds which were significant for small uterine fibroid and left ovary cysts. In 2017, she underwent more pelvic ultrasounds and subsequently underwent a hysterectomy.  Recently, patient was seen at urgent care on 12/13/2017 and 2/182020 with a complaint of abdominal pain, RUQ, she was referred here for follow-up of symptoms. On both occasions, patient was given a short course of opioid pain medication. She complains today that the pain is about the same. She is not experiencing any nausea, vomiting, or diarrhea. Endorses constipation, bloating and gas sensation. She is afebrile. No prior known history of gallbladder disease. Unable to associates food intake with symptoms. She takes omeprazole daily without relief of symptoms.  Hypertension Patient was recently started on HCTZ after presenting to the urgent care with elevated blood pressure. She was previously prescribed lisinopril alone and since the addition of HCTZ, she has experienced dizziness and fatigue. She would like to switch back to lisinopril only.   Current medications: Current Outpatient Medications:  .   lisinopril-hydrochlorothiazide (ZESTORETIC) 20-12.5 MG tablet, Take 1 tablet by mouth daily., Disp: 30 tablet, Rfl: 1 .  omeprazole (PRILOSEC) 20 MG capsule, Take 1 capsule (20 mg total) by mouth daily., Disp: 30 capsule, Rfl: 1   Pertinent family medical history: family history includes Diabetes in an other family member; Hypertension in an other family member.   Allergies  Allergen Reactions  . Percocet [Oxycodone-Acetaminophen] Hives    Pt tolerates Hydrocodone  . Tramadol Other (See Comments)    Headache    Social History   Socioeconomic History  . Marital status: Single    Spouse name: Not on file  . Number of children: Not on file  . Years of education: Not on file  . Highest education level: Not on file  Occupational History  . Not on file  Social Needs  . Financial resource strain: Not on file  . Food insecurity:    Worry: Not on file    Inability: Not on file  . Transportation needs:    Medical: Not on file    Non-medical: Not on file  Tobacco Use  . Smoking status: Current Every Day Smoker    Types: Cigarettes    Last attempt to quit: 07/15/2014    Years since quitting: 4.4  . Smokeless tobacco: Never Used  Substance and Sexual Activity  . Alcohol use: Not Currently    Comment: socially  . Drug use: No  . Sexual activity: Not Currently    Partners: Male    Birth control/protection: Surgical  Lifestyle  . Physical activity:    Days per week: Not on file    Minutes per session: Not on  file  . Stress: Not on file  Relationships  . Social connections:    Talks on phone: Not on file    Gets together: Not on file    Attends religious service: Not on file    Active member of club or organization: Not on file    Attends meetings of clubs or organizations: Not on file    Relationship status: Not on file  . Intimate partner violence:    Fear of current or ex partner: Not on file    Emotionally abused: Not on file    Physically abused: Not on file    Forced  sexual activity: Not on file  Other Topics Concern  . Not on file  Social History Narrative  . Not on file    Review of Systems: Pertinent negatives listed in HPI Objective:   Vitals:   12/30/18 1559  BP: 91/63  Pulse: (!) 102  Resp: 17  Temp: 98.3 F (36.8 C)  SpO2: 96%    BP Readings from Last 3 Encounters:  12/30/18 91/63  12/16/18 (!) 141/93  12/13/18 (!) 148/116    Filed Weights   12/30/18 1559  Weight: 162 lb (73.5 kg)      Physical Exam: Constitutional: Patient appears well-developed and well-nourished. No distress. HENT: Normocephalic, atraumatic, External right and left ear normal. Oropharynx is clear and moist.  Eyes: Conjunctivae and EOM are normal. PERRLA, no scleral icterus. Neck: Normal ROM. Neck supple. No JVD. No tracheal deviation. No thyromegaly. CVS: RRR, S1/S2 +, no murmurs, no gallops, no carotid bruit.  Pulmonary: Effort and breath sounds normal, no stridor, rhonchi, wheezes, rales.  Abdominal: Soft. BS +, no distension, tenderness, rebound or guarding.  Musculoskeletal: Normal range of motion. No edema and no tenderness.  Neuro: Alert. Normal muscle tone coordination. Normal gait.  Skin: Skin is warm and dry. No rash noted. Not diaphoretic. No erythema. No pallor. Psychiatric: Normal mood and affect. Behavior, judgment, thought content normal.  Lab Results (prior encounters)  Lab Results  Component Value Date   WBC 6.0 11/04/2017   HGB 13.0 11/04/2017   HCT 41.3 11/04/2017   MCV 72.8 (L) 11/04/2017   PLT 317 11/04/2017   Lab Results  Component Value Date   CREATININE 0.58 11/04/2017   BUN 9 11/04/2017   NA 136 11/04/2017   K 3.8 11/04/2017   CL 108 11/04/2017   CO2 21 (L) 11/04/2017    Lab Results  Component Value Date   HGBA1C 5.7 (H) 11/13/2016    No results found for: CHOL, TRIG, HDL, CHOLHDL, VLDL, LDLCALC      Assessment and plan:  1. Encounter to establish care 2. Screening for blood or protein in urine No  evidence of UTI  - POCT URINALYSIS DIP (CLINITEK)  3. Essential hypertension, BP very low today Will d/c Lisinopril-HCTZ  - Comprehensive metabolic panel  4. RUQ abdominal pain - Lipase - Amylase - CBC with Differential - US Abdomen Limited RUQ; Future _GI referral once financial assistance approved  _Will not continue opioids for abdominal pain. Recommend probiotic in addition to omeprazole    5. History of prediabetes - Hemoglobin A1c   Follow-up: 4 weeks for hypertension evaluation. BP is low today.   The patient was given clear instructions to go to ER or return to medical center if symptoms don't improve, worsen or new problems develop. The patient verbalized understanding. The patient was advised  to call and obtain lab results if they haven't heard anything from out  office within 7-10 business days.  Molli Barrows, FNP [facility] [address] [place] [address] [phone]: [phone]    This note has been created with Dragon speech recognition software and Engineer, materials. Any transcriptional errors are unintentional.

## 2018-12-30 NOTE — Patient Instructions (Addendum)
Thank you for choosing Primary Care at [facility] to be your medical home!    Brianna Gutierrez was seen by Molli Barrows, FNP today.   Brianna Gutierrez's primary care provider is Scot Jun, FNP.   For the best care possible, you should try to see Molli Barrows, FNP-C whenever you come to the clinic.   We look forward to seeing you again soon!  If you have any questions about your visit today, please call us at [phone] or feel free to reach your primary care provider via [place].   I recommend that you take a probiotic daily to help with gut health I recommend that you take a stool softener as needed for constipation We will call you to get you scheduled for your right upper quadrant abdominal ultrasound  Please return in 4 weeks for Blood Pressure check  Constipation, Adult Constipation is when a person:  Poops (has a bowel movement) fewer times in a week than normal.  Has a hard time pooping.  Has poop that is dry, hard, or bigger than normal. Follow these instructions at home: Eating and drinking   Eat foods that have a lot of fiber, such as: ? Fresh fruits and vegetables. ? Whole grains. ? Beans.  Eat less of foods that are high in fat, low in fiber, or overly processed, such as: ? [place] fries. ? Hamburgers. ? Cookies. ? Candy. ? Soda.  Drink enough fluid to keep your pee (urine) clear or pale yellow. General instructions  Exercise regularly or as told by your doctor.  Go to the restroom when you feel like you need to poop. Do not hold it in.  Take over-the-counter and prescription medicines only as told by your doctor. These include any fiber supplements.  Do pelvic floor retraining exercises, such as: ? Doing deep breathing while relaxing your lower belly (abdomen). ? Relaxing your pelvic floor while pooping.  Watch your condition for any changes.  Keep all follow-up visits as told by your doctor. This is important. Contact a doctor  if:  You have pain that gets worse.  You have a fever.  You have not pooped for 4 days.  You throw up (vomit).  You are not hungry.  You lose weight.  You are bleeding from the anus.  You have thin, pencil-like poop (stool). Get help right away if:  You have a fever, and your symptoms suddenly get worse.  You leak poop or have blood in your poop.  Your belly feels hard or bigger than normal (is bloated).  You have very bad belly pain.  You feel dizzy or you faint. This information is not intended to replace advice given to you by your health care provider. Make sure you discuss any questions you have with your health care provider. Document Released: 04/02/2008 Document Revised: 05/04/2016 Document Reviewed: 04/04/2016 Elsevier Interactive Patient Education  2019 [place].  Abdominal Pain, Adult  Many things can cause belly (abdominal) pain. Most times, belly pain is not dangerous. Many cases of belly pain can be watched and treated at home. Sometimes belly pain is serious, though. Your doctor will try to find the cause of your belly pain. Follow these instructions at home:  Take over-the-counter and prescription medicines only as told by your doctor. Do not take medicines that help you poop (laxatives) unless told to by your doctor.  Drink enough fluid to keep your pee (urine) clear or pale yellow.  Watch your belly pain for any changes.  Keep all follow-up visits as told by your doctor. This is important. Contact a doctor if:  Your belly pain changes or gets worse.  You are not hungry, or you lose weight without trying.  You are having trouble pooping (constipated) or have watery poop (diarrhea) for more than 2-3 days.  You have pain when you pee or poop.  Your belly pain wakes you up at night.  Your pain gets worse with meals, after eating, or with certain foods.  You are throwing up and cannot keep anything down.  You have a fever. Get help right  away if:  Your pain does not go away as soon as your doctor says it should.  You cannot stop throwing up.  Your pain is only in areas of your belly, such as the right side or the left lower part of the belly.  You have bloody or black poop, or poop that looks like tar.  You have very bad pain, cramping, or bloating in your belly.  You have signs of not having enough fluid or water in your body (dehydration), such as: ? Dark pee, very little pee, or no pee. ? Cracked lips. ? Dry mouth. ? Sunken eyes. ? Sleepiness. ? Weakness. This information is not intended to replace advice given to you by your health care provider. Make sure you discuss any questions you have with your health care provider. Document Released: 04/02/2008 Document Revised: 05/04/2016 Document Reviewed: 03/28/2016 Elsevier Interactive Patient Education  2019 [employer].

## 2018-12-30 NOTE — Progress Notes (Signed)
New Patient Office Visit  Subjective:  Patient ID: Brianna Gutierrez, female    DOB: 01/18/1976  Age: 43 y.o. MRN: [id number]  CC:  Chief Complaint  Patient presents with  . Establish Care  . Follow-up    urgent care 2/15 & 2/18: RUQ abdominal pain    HPI Brianna Gutierrez is a 43 year old [place] American female that presents today for urgent care follow-up and to establish care. She was recently seen at the [facility] on 12/13/2018 and [facility] on 12/16/2018 with right upper quadrant abdominal pain. She endorses that the pain is a cramp like pain with occasional sharpness that sometimes worsens after eating. She endorses that the pain hasn't increased in intensity since urgent care visit but has persisted. She endorses that pain has been present intermittently for the past year. She denies any nausea, vomiting, or diarrhea but does endorse occasional constipation. She does have a history of GERD and is currently on prilosec 20 mg daily and is compliant with regimen , as well as tries to monitor diet and avoid foods that may aggravate her condition. She denies alcohol use but does endorse smoking. She denies any current chest pain, palpitations, or shortness of breath.   Past Medical History:  Diagnosis Date  . Borderline diabetes   . Hypertension     Past Surgical History:  Procedure Laterality Date  . CESAREAN SECTION    . DILITATION & CURRETTAGE/HYSTROSCOPY WITH HYDROTHERMAL ABLATION N/A 06/22/2016   Procedure: DILATATION & CURETTAGE/HYSTEROSCOPY WITH HYDROTHERMAL ABLATION;  Surgeon: Shelly Bombard, MD;  Location: [facility];  Service: Gynecology;  Laterality: N/A;  . TUBAL LIGATION      Family History  Problem Relation Age of Onset  . Diabetes Other   . Hypertension Other     Social History   Socioeconomic History  . Marital status: Single    Spouse name: Not on file  . Number of children: Not on file  . Years of education: Not on file  . Highest education  level: Not on file  Occupational History  . Not on file  Social Needs  . Financial resource strain: Not on file  . Food insecurity:    Worry: Not on file    Inability: Not on file  . Transportation needs:    Medical: Not on file    Non-medical: Not on file  Tobacco Use  . Smoking status: Current Every Day Smoker    Types: Cigarettes    Last attempt to quit: 07/15/2014    Years since quitting: 4.4  . Smokeless tobacco: Never Used  Substance and Sexual Activity  . Alcohol use: Not Currently    Comment: socially  . Drug use: No  . Sexual activity: Not Currently    Partners: Male    Birth control/protection: Surgical  Lifestyle  . Physical activity:    Days per week: Not on file    Minutes per session: Not on file  . Stress: Not on file  Relationships  . Social connections:    Talks on phone: Not on file    Gets together: Not on file    Attends religious service: Not on file    Active member of club or organization: Not on file    Attends meetings of clubs or organizations: Not on file    Relationship status: Not on file  . Intimate partner violence:    Fear of current or ex partner: Not on file    Emotionally  abused: Not on file    Physically abused: Not on file    Forced sexual activity: Not on file  Other Topics Concern  . Not on file  Social History Narrative  . Not on file   Review of Systems  Constitutional: Negative for malaise/fatigue and weight loss.  Respiratory: Negative for cough, shortness of breath and wheezing.   Cardiovascular: Negative for palpitations and leg swelling.  Gastrointestinal: Positive for abdominal pain, constipation and heartburn. Negative for blood in stool, diarrhea, nausea and vomiting.  Genitourinary: Negative for dysuria, flank pain, frequency and hematuria.  Neurological: Negative for dizziness, weakness and headaches.     Objective:   Today's Vitals: BP 91/63   Pulse (!) 102   Temp 98.3 F (36.8 C) (Oral)   Resp 17   Ht  4\' 11"  (1.499 m)   Wt 162 lb (73.5 kg)   LMP 12/01/2018 (Approximate)   SpO2 96%   BMI 32.72 kg/m   Physical Exam Constitutional:      Appearance: Normal appearance. She is normal weight.  Cardiovascular:     Rate and Rhythm: Regular rhythm. Tachycardia present.     Pulses: Normal pulses.     Heart sounds: Normal heart sounds.  Pulmonary:     Effort: Pulmonary effort is normal.     Breath sounds: Normal breath sounds. No wheezing or rhonchi.  Abdominal:     General: Bowel sounds are normal.     Palpations: Abdomen is soft.     Tenderness: There is abdominal tenderness in the right upper quadrant and left upper quadrant. There is guarding and rebound. There is no right CVA tenderness or left CVA tenderness. Positive signs include Murphy's sign.  Skin:    Capillary Refill: Capillary refill takes less than 2 seconds.  Neurological:     General: No focal deficit present.     Mental Status: She is alert and oriented to person, place, and time. Mental status is at baseline.  Psychiatric:        Mood and Affect: Mood normal.        Behavior: Behavior normal.        Thought Content: Thought content normal.        Judgment: Judgment normal.     Assessment & Plan:   Problem List Items Addressed This Visit    None    Visit Diagnoses    Encounter to establish care    -  Primary   Screening for blood or protein in urine       Relevant Orders   POCT URINALYSIS DIP (CLINITEK) (Completed)   Essential hypertension       Relevant Orders   Comprehensive metabolic panel   RUQ abdominal pain       Relevant Orders   Lipase   Amylase   CBC with Differential   US Abdomen Limited RUQ   History of prediabetes       Relevant Orders   Hemoglobin A1c      Outpatient Encounter Medications as of 12/30/2018  Medication Sig  . lisinopril-hydrochlorothiazide (ZESTORETIC) 20-12.5 MG tablet Take 1 tablet by mouth daily.  Marland Kitchen omeprazole (PRILOSEC) 20 MG capsule Take 1 capsule (20 mg total) by  mouth daily.  . [DISCONTINUED] HYDROcodone-acetaminophen (NORCO/VICODIN) 5-325 MG tablet Take 1 tablet by mouth every 4 (four) hours as needed for severe pain.  . [DISCONTINUED] ondansetron (ZOFRAN) 4 MG tablet Take 1 tablet (4 mg total) by mouth every 6 (six) hours.   No facility-administered encounter medications  on file as of 12/30/2018.   - Baseline labs, RUQ [place] for persistent RUQ abdominal pain - probiotic daily and stool softener as needed for gut health and constipaiton -BP check in 4 weeks  Follow-up: Return in about 4 weeks (around 01/27/2019).   Claria Dice, RN

## 2018-12-30 NOTE — Telephone Encounter (Signed)
Please schedule RUQ ultrasound for patient.

## 2018-12-31 ENCOUNTER — Emergency Department (HOSPITAL_COMMUNITY): Payer: Self-pay

## 2018-12-31 ENCOUNTER — Other Ambulatory Visit: Payer: Self-pay

## 2018-12-31 ENCOUNTER — Encounter (HOSPITAL_COMMUNITY): Payer: Self-pay | Admitting: Emergency Medicine

## 2018-12-31 ENCOUNTER — Emergency Department (HOSPITAL_COMMUNITY)
Admission: EM | Admit: 2018-12-31 | Discharge: 2018-12-31 | Disposition: A | Discharge status: Home / Self Care | Payer: Self-pay | Attending: Emergency Medicine | Admitting: Emergency Medicine

## 2018-12-31 DIAGNOSIS — R109 Unspecified abdominal pain: Secondary | ICD-10-CM

## 2018-12-31 DIAGNOSIS — I10 Essential (primary) hypertension: Secondary | ICD-10-CM | POA: Insufficient documentation

## 2018-12-31 DIAGNOSIS — F17210 Nicotine dependence, cigarettes, uncomplicated: Secondary | ICD-10-CM | POA: Insufficient documentation

## 2018-12-31 DIAGNOSIS — K8020 Calculus of gallbladder without cholecystitis without obstruction: Secondary | ICD-10-CM

## 2018-12-31 DIAGNOSIS — Z79899 Other long term (current) drug therapy: Secondary | ICD-10-CM | POA: Insufficient documentation

## 2018-12-31 LAB — COMPREHENSIVE METABOLIC PANEL
ALK PHOS: 85 IU/L (ref 39–117)
ALK PHOS: 83 U/L (ref 38–126)
ALT: 18 IU/L (ref 0–32)
ALT: 21 U/L (ref 0–44)
AST: 13 IU/L (ref 0–40)
AST: 14 U/L — ABNORMAL LOW (ref 15–41)
Albumin/Globulin Ratio: 1.2 (ref 1.2–2.2)
Albumin: 4.1 g/dL (ref 3.8–4.8)
Albumin: 4 g/dL (ref 3.5–5.0)
Anion gap: 9 (ref 5–15)
BUN/Creatinine Ratio: 16 (ref 9–23)
BUN: 13 mg/dL (ref 6–24)
BUN: 14 mg/dL (ref 6–20)
CALCIUM: 9 mg/dL (ref 8.9–10.3)
CHLORIDE: 103 mmol/L (ref 96–106)
CO2: 22 mmol/L (ref 20–29)
CO2: 25 mmol/L (ref 22–32)
Calcium: 9.4 mg/dL (ref 8.7–10.2)
Chloride: 102 mmol/L (ref 98–111)
Creatinine, Ser: 0.79 mg/dL (ref 0.57–1.00)
Creatinine, Ser: 0.87 mg/dL (ref 0.44–1.00)
GFR calc Af Amer: >60 mL/min (ref >60)
GFR calc non Af Amer: 93 mL/min/{1.73_m2} (ref >59)
GFR calc non Af Amer: >60 mL/min (ref >60)
GFR, EST AFRICAN AMERICAN: 107 mL/min/{1.73_m2} (ref >59)
GLUCOSE: 102 mg/dL — AB (ref 70–99)
Globulin, Total: 3.3 g/dL (ref 1.5–4.5)
Glucose: 115 mg/dL — ABNORMAL HIGH (ref 65–99)
POTASSIUM: 4.4 mmol/L (ref 3.5–5.2)
Potassium: 4 mmol/L (ref 3.5–5.1)
Sodium: 138 mmol/L (ref 134–144)
Sodium: 136 mmol/L (ref 135–145)
Total Bilirubin: 0.4 mg/dL (ref 0.3–1.2)
Total Protein: 7.4 g/dL (ref 6.0–8.5)
Total Protein: 8.6 g/dL — ABNORMAL HIGH (ref 6.5–8.1)

## 2018-12-31 LAB — CBC
HCT: 46.3 % — ABNORMAL HIGH (ref 36.0–46.0)
Hemoglobin: 13.9 g/dL (ref 12.0–15.0)
MCH: 22.9 pg — AB (ref 26.0–34.0)
MCHC: 30 g/dL (ref 30.0–36.0)
MCV: 76.4 fL — ABNORMAL LOW (ref 80.0–100.0)
Platelets: 344 10*3/uL (ref 150–400)
RBC: 6.06 MIL/uL — ABNORMAL HIGH (ref 3.87–5.11)
RDW: 14.5 % (ref 11.5–15.5)
WBC: 5.5 10*3/uL (ref 4.0–10.5)
nRBC: 0 % (ref 0.0–0.2)

## 2018-12-31 LAB — CBC WITH DIFFERENTIAL/PLATELET
BASOS ABS: 0 10*3/uL (ref 0.0–0.2)
Basos: 0 %
EOS (ABSOLUTE): 0.2 10*3/uL (ref 0.0–0.4)
Eos: 4 %
Hematocrit: 43.8 % (ref 34.0–46.6)
Hemoglobin: 13.4 g/dL (ref 11.1–15.9)
IMMATURE GRANS (ABS): 0 10*3/uL (ref 0.0–0.1)
Immature Granulocytes: 0 %
LYMPHS ABS: 1.4 10*3/uL (ref 0.7–3.1)
LYMPHS: 27 %
MCH: 22.9 pg — AB (ref 26.6–33.0)
MCHC: 30.6 g/dL — AB (ref 31.5–35.7)
MCV: 75 fL — ABNORMAL LOW (ref 79–97)
MONOS ABS: 0.4 10*3/uL (ref 0.1–0.9)
Monocytes: 8 %
Neutrophils Absolute: 3.1 10*3/uL (ref 1.4–7.0)
Neutrophils: 61 %
PLATELETS: 364 10*3/uL (ref 150–450)
RBC: 5.86 x10E6/uL — ABNORMAL HIGH (ref 3.77–5.28)
RDW: 14.3 % (ref 11.7–15.4)
WBC: 5.2 10*3/uL (ref 3.4–10.8)

## 2018-12-31 LAB — URINALYSIS, ROUTINE W REFLEX MICROSCOPIC
Bacteria, UA: NONE SEEN
Bilirubin Urine: NEGATIVE
Glucose, UA: NEGATIVE mg/dL
Hgb urine dipstick: NEGATIVE
Ketones, ur: NEGATIVE mg/dL
Nitrite: NEGATIVE
Protein, ur: NEGATIVE mg/dL
SPECIFIC GRAVITY, URINE: 1.012 (ref 1.005–1.030)
pH: 6 (ref 5.0–8.0)

## 2018-12-31 LAB — I-STAT TROPONIN, ED
TROPONIN I, POC: 0.01 ng/mL (ref 0.00–0.08)
Troponin i, poc: 0.01 ng/mL (ref 0.00–0.08)

## 2018-12-31 LAB — LIPASE: Lipase: 26 U/L (ref 14–72)

## 2018-12-31 LAB — HEMOGLOBIN A1C
Est. average glucose Bld gHb Est-mCnc: 111 mg/dL
HEMOGLOBIN A1C: 5.5 % (ref 4.8–5.6)

## 2018-12-31 LAB — AMYLASE: AMYLASE: 70 U/L (ref 31–110)

## 2018-12-31 LAB — LIPASE, BLOOD: Lipase: 41 U/L (ref 11–51)

## 2018-12-31 LAB — I-STAT BETA HCG BLOOD, ED (MC, WL, AP ONLY): I-stat hCG, quantitative: <5 m[IU]/mL (ref <5)

## 2018-12-31 MED ORDER — HYDROCODONE-ACETAMINOPHEN 5-325 MG PO TABS: 1.0000 | ORAL_TABLET | Freq: Four times a day (QID) | ORAL | 0 refills | Status: DC | PRN

## 2018-12-31 MED ORDER — FENTANYL CITRATE (PF) 100 MCG/2ML IJ SOLN
50.0000 ug | Freq: Once | INTRAMUSCULAR | Status: AC
  Administered 2018-12-31: 50 ug via INTRAVENOUS
  Filled 2018-12-31: qty 2

## 2018-12-31 MED ORDER — SODIUM CHLORIDE 0.9 % IV BOLUS
1000.0000 mL | Freq: Once | INTRAVENOUS | Status: AC
  Administered 2018-12-31: 1000 mL via INTRAVENOUS

## 2018-12-31 MED ORDER — SODIUM CHLORIDE 0.9% FLUSH
3.0000 mL | Freq: Once | INTRAVENOUS | Status: AC
  Administered 2018-12-31: 3 mL via INTRAVENOUS

## 2018-12-31 MED ORDER — KETOROLAC TROMETHAMINE 15 MG/ML IJ SOLN
15.0000 mg | Freq: Once | INTRAMUSCULAR | Status: AC
  Administered 2018-12-31: 15 mg via INTRAVENOUS
  Filled 2018-12-31: qty 1

## 2018-12-31 MED ORDER — HYDROCODONE-ACETAMINOPHEN 5-325 MG PO TABS
1.0000 | ORAL_TABLET | Freq: Once | ORAL | Status: AC
  Administered 2018-12-31: 1 via ORAL
  Filled 2018-12-31: qty 1

## 2018-12-31 MED ORDER — ONDANSETRON HCL 4 MG/2ML IJ SOLN
4.0000 mg | Freq: Once | INTRAMUSCULAR | Status: AC
  Administered 2018-12-31: 4 mg via INTRAVENOUS
  Filled 2018-12-31: qty 2

## 2018-12-31 MED ORDER — FAMOTIDINE IN NACL 20-0.9 MG/50ML-% IV SOLN
20.0000 mg | Freq: Once | INTRAVENOUS | Status: AC
  Administered 2018-12-31: 20 mg via INTRAVENOUS
  Filled 2018-12-31: qty 50

## 2018-12-31 MED ORDER — LIDOCAINE VISCOUS HCL 2 % MT SOLN
15.0000 mL | Freq: Once | OROMUCOSAL | Status: AC
  Administered 2018-12-31: 15 mL via ORAL
  Filled 2018-12-31: qty 15

## 2018-12-31 MED ORDER — ALUM & MAG HYDROXIDE-SIMETH 200-200-20 MG/5ML PO SUSP
30.0000 mL | Freq: Once | ORAL | Status: AC
  Administered 2018-12-31: 30 mL via ORAL
  Filled 2018-12-31: qty 30

## 2018-12-31 NOTE — ED Triage Notes (Signed)
Patient here from home with complaints of right upper quadrant abd pain, nausea that started "weeks ago". States that PCP scheduled an [place] but "cannot wait that long". Pain 10/10.

## 2018-12-31 NOTE — ED Provider Notes (Signed)
[facility] DEPT Provider Note   CSN: [id number] Arrival date & time: 12/31/18  0932    History   Chief Complaint Chief Complaint  Patient presents with  . Abdominal Pain    HPI Brianna Gutierrez is a 43 y.o. female with a hx of tobacco abuse, HTN, borderline diabetes, and prior tubal ligation & c-section who presents to the ED with complaints of acutely worsened abdominal pain since 5:30 AM this morning. Patient states she has had issues with intermittent abdominal pain for several months. She states over the past few weeks it has increased in frequency & intensity leading to two urgent care visits and a visit to primary care yesterday. She states blood was drawn yesterday and that they were planning to obtain an outpatient RUQ [place] to evaluate her gallbladder. This morning at 05:30 the pain seemed to be worse and has remained constant since that time. Pain is in the RUQ and radiates to the epigastrium and sometimes to the chest, it is described as severe & crampy/sharp in nature. She states her entire abdomen feels like it is some what gaseous. Reports associated nausea without vomiting- received a nausea medicine from UC which she took at 05:30 which seemed to help, otherwise no specific alleviating/aggravating factors. Denies fever, chills, dyspnea, diaphoresis, syncope, emesis, diarrhea, constipation, melena, hematochezia, or dysuria. Denies leg pain/swelling, hemoptysis, recent surgery/trauma, recent long travel, hormone use, personal hx of cancer, or hx of DVT/PE.        HPI  Past Medical History:  Diagnosis Date  . Borderline diabetes   . Hypertension     Patient Active Problem List   Diagnosis Date Noted  . Pre-diabetes 11/15/2016  . Uterine fibroid 04/27/2016  . Abnormal uterine bleeding (AUB) 09/28/2015  . History of bilateral tubal ligation 09/28/2015  . Tobacco abuse 09/28/2015    Past Surgical History:  Procedure Laterality Date  .  CESAREAN SECTION    . DILITATION & CURRETTAGE/HYSTROSCOPY WITH HYDROTHERMAL ABLATION N/A 06/22/2016   Procedure: DILATATION & CURETTAGE/HYSTEROSCOPY WITH HYDROTHERMAL ABLATION;  Surgeon: Shelly Bombard, MD;  Location: [facility];  Service: Gynecology;  Laterality: N/A;  . TUBAL LIGATION       OB History    Gravida  4   Para      Term      Preterm      AB      Living  4     SAB      TAB      Ectopic      Multiple      Live Births  4            Home Medications    Prior to Admission medications   Medication Sig Start Date End Date Taking? Authorizing Provider  lisinopril-hydrochlorothiazide (ZESTORETIC) 20-12.5 MG tablet Take 1 tablet by mouth daily. 12/13/18   Raylene Everts, MD  omeprazole (PRILOSEC) 20 MG capsule Take 1 capsule (20 mg total) by mouth daily. 12/16/18   Robyn Haber, MD    Family History Family History  Problem Relation Age of Onset  . Diabetes Other   . Hypertension Other     Social History Social History   Tobacco Use  . Smoking status: Current Every Day Smoker    Types: Cigarettes    Last attempt to quit: 07/15/2014    Years since quitting: 4.4  . Smokeless tobacco: Never Used  Substance Use Topics  . Alcohol use: Not Currently    Comment: socially  .  Drug use: No     Allergies   Percocet [oxycodone-acetaminophen] and Tramadol   Review of Systems Review of Systems  Constitutional: Negative for chills, diaphoresis and fever.  Respiratory: Negative for shortness of breath.   Cardiovascular: Positive for chest pain. Negative for palpitations and leg swelling.  Gastrointestinal: Positive for abdominal pain and nausea. Negative for anal bleeding, blood in stool, constipation, diarrhea and vomiting.  Genitourinary: Negative for dysuria, vaginal bleeding and vaginal discharge.  Neurological: Negative for syncope.  All other systems reviewed and are negative.    Physical Exam Updated Vital Signs BP 117/79 (BP Location:  Right Arm)   Pulse 88   Temp 98.1 F (36.7 C) (Oral)   Resp 20   LMP 12/01/2018 (Approximate)   SpO2 100%   Physical Exam Vitals signs and nursing note reviewed.  Constitutional:      General: She is not in acute distress.    Appearance: She is well-developed. She is not toxic-appearing.  HENT:     Head: Normocephalic and atraumatic.  Eyes:     General:        Right eye: No discharge.        Left eye: No discharge.     Conjunctiva/sclera: Conjunctivae normal.  Neck:     Musculoskeletal: Neck supple.  Cardiovascular:     Rate and Rhythm: Normal rate and regular rhythm.  Pulmonary:     Effort: Pulmonary effort is normal. No respiratory distress.     Breath sounds: Normal breath sounds. No wheezing, rhonchi or rales.  Chest:     Chest wall: No tenderness.  Abdominal:     General: There is no distension.     Palpations: Abdomen is soft.     Tenderness: There is abdominal tenderness in the right upper quadrant and epigastric area. There is no guarding or rebound. Negative signs include McBurney's sign.  Skin:    General: Skin is warm and dry.     Findings: No rash.  Neurological:     Mental Status: She is alert.     Comments: Clear speech.   Psychiatric:        Behavior: Behavior normal.    ED Treatments / Results  Labs (all labs ordered are listed, but only abnormal results are displayed) Labs Reviewed  COMPREHENSIVE METABOLIC PANEL - Abnormal; Notable for the following components:      Result Value   Glucose, Bld 102 (*)    Total Protein 8.6 (*)    AST 14 (*)    All other components within normal limits  CBC - Abnormal; Notable for the following components:   RBC 6.06 (*)    HCT 46.3 (*)    MCV 76.4 (*)    MCH 22.9 (*)    All other components within normal limits  URINALYSIS, ROUTINE W REFLEX MICROSCOPIC - Abnormal; Notable for the following components:   Leukocytes,Ua TRACE (*)    All other components within normal limits  LIPASE, BLOOD  I-STAT BETA HCG  BLOOD, ED (MC, WL, AP ONLY)  I-STAT TROPONIN, ED  I-STAT TROPONIN, ED    EKG EKG Interpretation  Date/Time:  Wednesday December 31 2018 10:29:17 EST Ventricular Rate:  81 PR Interval:    QRS Duration: 84 QT Interval:  341 QTC Calculation: 396 R Axis:   70 Text Interpretation:  Sinus rhythm ST elev, probable normal early repol pattern Baseline wander in lead(s) V4 No significant change since last tracing Confirmed by Dorie Rank [phone]) on 12/31/2018 10:36:22 AM  Radiology Dg Abd Acute W/chest  Result Date: 12/31/2018 CLINICAL DATA:  43 year old female with right upper quadrant pain and nausea. EXAM: DG ABDOMEN ACUTE W/ 1V CHEST COMPARISON:  Ultrasound earlier today. Chest radiographs 11/04/2017. FINDINGS: Mediastinal contours remain normal. Mildly lower lung volumes. The lungs remain clear. No pneumothorax or pneumoperitoneum. Visualized tracheal air column is within normal limits. Normal bowel gas pattern on upright and supine views of the abdomen. 6-7 millimeter calculus projecting over the left renal shadow. Otherwise negative abdominal and pelvic visceral contours. No acute osseous abnormality identified. IMPRESSION: 1. Normal bowel gas pattern, no free air. 2. Evidence of  left nephrolithiasis, 6-7 mm. 3. No cardiopulmonary abnormality. Electronically Signed   By: Genevie Ann M.D.   On: 12/31/2018 12:22   US Abdomen Limited Ruq  Result Date: 12/31/2018 CLINICAL DATA:  Abdominal pain. EXAM: ULTRASOUND ABDOMEN LIMITED RIGHT UPPER QUADRANT COMPARISON:  None. FINDINGS: Gallbladder: There is a 2.2 cm stone in the neck of the gallbladder. There is no gallbladder wall thickening. Negative sonographic Murphy's sign. Common bile duct: Diameter: 4.7 mm, normal. Liver: No focal lesion identified. Slight increased echogenicity of the liver parenchyma. Portal vein is patent on color Doppler imaging with normal direction of blood flow towards the liver. Incidental note is made of a 1.7 cm simple appearing cyst  on the upper pole of the right kidney. IMPRESSION: 1. Cholelithiasis. 2. Slightly increased echogenicity of the renal parenchyma suggesting hepatic steatosis. 3. 1.7 cm benign-appearing cyst on the upper pole the right kidney. Electronically Signed   By: Lorriane Shire M.D.   On: 12/31/2018 11:58    Procedures Procedures (including critical care time)  Medications Ordered in ED Medications  sodium chloride flush (NS) 0.9 % injection 3 mL (has no administration in time range)     Initial Impression / Assessment and Plan / ED Course  I have reviewed the triage vital signs and the nursing notes.  Pertinent labs & imaging results that were available during my care of the patient were reviewed by me and considered in my medical decision making (see chart for details).   Patient presents to the emergency department with intermittent abdominal pain w/ associated nauesa over the past several months which seem to acutely worsened at 530 this AM.  Patient nontoxic-appearing, in no apparent distress, initial vitals WNL.  On exam patient has right upper quadrant abdominal tenderness without peritoneal signs, negative Murphy's.  Will further evaluate abdominal labs and right upper quadrant ultrasound.  Given pain radiates to the chest we will also obtain an EKG, troponin, and chest/abdominal series x-ray.  Analgesics and antiemetics have been ordered.  Work-up reviewed: CBC: No leukocytosis.  Hemoglobin within normal limits. CMP: Fairly unremarkable.  No LFT elevation.  Renal function preserved.  Electrolytes within normal limits. Lipase: Within normal limits. Urinalysis: Does not seem consistent with overt UTI especially without urinary symptoms. Pregnancy test: Negative, doubt ectopic. Delta troponin: negative EKG: No significant change from prior.   RUQ [place]: Cholelithiasis. Slightly increased echogenicity of the renal parenchyma suggesting hepatic steatosis. 1.7 cm benign-appearing cyst on the upper  pole the right kidney.  X-ray: Normal bowel gas pattern, no free air. Evidence of  left nephrolithiasis, 6-7 mm. No cardiopulmonary abnormality.   On re-evaluation patient with some improvement, but remains uncomfortable, will re-dose pain medicine & try GI cocktail.   On re-evaluation again w/ some improvement, but remains uncomfortable. Trial of toradol & PO medicine.   15:15: Patient feeling much improved, she states the medicines  and ability to pass some gas in the ER has made her feel much better. Repeat abdominal exam remains without peritoneal signs, doubt cholecystitis, pancreatitis, diverticulitis, appendicitis, obstruction, perforation, ectopic pregnancy, or PID. Low risk heart score, delta trop negative, EKG no STEMI- doubt ACS. PERC negative doubt PE. Unclear definitive etiology to patient's symptoms.  Possibly cholelithiasis related, however it does not classically occur with food, feel that follow-up with general surgery is reasonable in addition to PCP follow-up.  Will discharge home with a few tablets of Norco as these have happened in the past. [place] Controlled Substance reporting System queried. I discussed results, treatment plan, need for follow-up, and return precautions with the patient. Provided opportunity for questions, patient confirmed understanding and is in agreement with plan.    Final Clinical Impressions(s) / ED Diagnoses   Final diagnoses:  Abdominal pain  Calculus of gallbladder without cholecystitis without obstruction    ED Discharge Orders         Ordered    HYDROcodone-acetaminophen (NORCO/VICODIN) 5-325 MG tablet  Every 6 hours PRN     12/31/18 [address] [place], PA-C 12/31/18 1523    Dorie Rank, MD 01/01/19 1506

## 2018-12-31 NOTE — Discharge Instructions (Addendum)
You were seen in the emergency department today for abdominal pain.  Your work-up was overall reassuring.  Your labs were all fairly normal.  Your ultrasound showed that you have some gallstones in your gallbladder which may be related to your pain.  You also have some fatty liver changes noted.  There is also a 1.7 cm size cyst on the upper portion of your right kidney that looks benign.  We are sending you home with Vicodin to help with discomfort.   Vicodin- this is a narcotic/controlled substance medication that has potential addicting qualities.  We recommend that you take 1-2 tablets every 6 hours as needed for severe pain.  Do not drive or operate heavy machinery when taking this medicine as it can be sedating. Do not drink alcohol or take other sedating medications when taking this medicine for safety reasons.  Keep this out of reach of small children.  Please be aware this medicine has Tylenol in it (325 mg/tab) do not exceed the maximum dose of Tylenol in a day per over the counter recommendations should you decide to supplement with Tylenol over the counter.   We have prescribed you new medication(s) today. Discuss the medications prescribed today with your pharmacist as they can have adverse effects and interactions with your other medicines including over the counter and prescribed medications. Seek medical evaluation if you start to experience new or abnormal symptoms after taking one of these medicines, seek care immediately if you start to experience difficulty breathing, feeling of your throat closing, facial swelling, or rash as these could be indications of a more serious allergic reaction   It is unclear exactly what the cause of your symptoms are.  It is possible due to the gallstones.  Would like you to follow-up with general surgery as well as your primary care provider within 3 days for reevaluation.  Return to the ER for new or worsening symptoms including but not limited to  inability to keep fluids down, worsening pain, blood in your stool or vomit, fever, or any other concerns.

## 2018-12-31 NOTE — Telephone Encounter (Signed)
Patient went to ED today for abdominal pain. Abdominal US performed during ED visit.

## 2018-12-31 NOTE — ED Notes (Signed)
Ronalee Belts RN at bedside attempting [place] IV

## 2019-01-01 ENCOUNTER — Telehealth: Payer: Self-pay | Admitting: Family Medicine

## 2019-01-01 MED ORDER — OMEPRAZOLE 20 MG PO CPDR: 20.0000 mg | DELAYED_RELEASE_CAPSULE | Freq: Every day | ORAL | 1 refills | Status: DC

## 2019-01-01 NOTE — Telephone Encounter (Signed)
Spoke to patient and informed her of PCP's decision, patient understood and said she would just keep her march 31st appointment.

## 2019-01-01 NOTE — Telephone Encounter (Signed)
Patient called requesting a HFU appointment because she was seen at [facility] for the abdominal pain yesterday. Patient was given medication for only three days, patient was made aware that PCP does not fill controlled pain medication. Patient stated she just wants to be seen ASAP, would you be willing to double book?

## 2019-01-01 NOTE — Telephone Encounter (Signed)
I have reviewed her notes from her ER visits and her labs were reassuring along with her imaging of her abdomen. It is too soon to repeat labs. She was seen this week for the same problem. She is currently prescribed omeprazole, and I can send a refill if needed. I will not continue pain medication prescribed in the ER as it is not warranted. I recommend continuing omeprazole. She can follow-up in office in 1 week. I will go ahead place a referral to gastroenterology. She will need to complete required financial paperwork.

## 2019-01-13 ENCOUNTER — Other Ambulatory Visit: Payer: Self-pay

## 2019-01-13 ENCOUNTER — Encounter (HOSPITAL_COMMUNITY): Payer: Self-pay | Admitting: General Surgery

## 2019-01-13 ENCOUNTER — Observation Stay (HOSPITAL_COMMUNITY)
Admission: EM | Admit: 2019-01-13 | Discharge: 2019-01-15 | Disposition: A | Discharge status: Home / Self Care | Payer: Self-pay | Attending: Surgery | Admitting: Surgery

## 2019-01-13 DIAGNOSIS — Z6832 Body mass index (BMI) 32.0-32.9, adult: Secondary | ICD-10-CM | POA: Insufficient documentation

## 2019-01-13 DIAGNOSIS — F17210 Nicotine dependence, cigarettes, uncomplicated: Secondary | ICD-10-CM | POA: Insufficient documentation

## 2019-01-13 DIAGNOSIS — K819 Cholecystitis, unspecified: Secondary | ICD-10-CM

## 2019-01-13 DIAGNOSIS — E669 Obesity, unspecified: Secondary | ICD-10-CM | POA: Insufficient documentation

## 2019-01-13 DIAGNOSIS — N939 Abnormal uterine and vaginal bleeding, unspecified: Secondary | ICD-10-CM | POA: Insufficient documentation

## 2019-01-13 DIAGNOSIS — Z833 Family history of diabetes mellitus: Secondary | ICD-10-CM | POA: Insufficient documentation

## 2019-01-13 DIAGNOSIS — Z79899 Other long term (current) drug therapy: Secondary | ICD-10-CM | POA: Insufficient documentation

## 2019-01-13 DIAGNOSIS — K219 Gastro-esophageal reflux disease without esophagitis: Secondary | ICD-10-CM | POA: Insufficient documentation

## 2019-01-13 DIAGNOSIS — K8020 Calculus of gallbladder without cholecystitis without obstruction: Secondary | ICD-10-CM | POA: Diagnosis present

## 2019-01-13 DIAGNOSIS — Z8249 Family history of ischemic heart disease and other diseases of the circulatory system: Secondary | ICD-10-CM | POA: Insufficient documentation

## 2019-01-13 DIAGNOSIS — K8012 Calculus of gallbladder with acute and chronic cholecystitis without obstruction: Principal | ICD-10-CM | POA: Insufficient documentation

## 2019-01-13 DIAGNOSIS — Z885 Allergy status to narcotic agent status: Secondary | ICD-10-CM | POA: Insufficient documentation

## 2019-01-13 DIAGNOSIS — I10 Essential (primary) hypertension: Secondary | ICD-10-CM | POA: Insufficient documentation

## 2019-01-13 DIAGNOSIS — R7303 Prediabetes: Secondary | ICD-10-CM | POA: Insufficient documentation

## 2019-01-13 DIAGNOSIS — K828 Other specified diseases of gallbladder: Secondary | ICD-10-CM | POA: Insufficient documentation

## 2019-01-13 DIAGNOSIS — Z791 Long term (current) use of non-steroidal anti-inflammatories (NSAID): Secondary | ICD-10-CM | POA: Insufficient documentation

## 2019-01-13 DIAGNOSIS — Z888 Allergy status to other drugs, medicaments and biological substances status: Secondary | ICD-10-CM | POA: Insufficient documentation

## 2019-01-13 LAB — COMPREHENSIVE METABOLIC PANEL
ALT: 15 U/L (ref 0–44)
AST: 14 U/L — ABNORMAL LOW (ref 15–41)
Albumin: 3.7 g/dL (ref 3.5–5.0)
Alkaline Phosphatase: 81 U/L (ref 38–126)
Anion gap: 10 (ref 5–15)
BUN: 13 mg/dL (ref 6–20)
CO2: 21 mmol/L — ABNORMAL LOW (ref 22–32)
Calcium: 8.8 mg/dL — ABNORMAL LOW (ref 8.9–10.3)
Chloride: 105 mmol/L (ref 98–111)
Creatinine, Ser: 0.69 mg/dL (ref 0.44–1.00)
Glucose, Bld: 92 mg/dL (ref 70–99)
Potassium: 4.5 mmol/L (ref 3.5–5.1)
Sodium: 136 mmol/L (ref 135–145)
Total Bilirubin: 0.3 mg/dL (ref 0.3–1.2)
Total Protein: 7.7 g/dL (ref 6.5–8.1)

## 2019-01-13 LAB — URINALYSIS, ROUTINE W REFLEX MICROSCOPIC
Bilirubin Urine: NEGATIVE
Glucose, UA: NEGATIVE mg/dL
Ketones, ur: NEGATIVE mg/dL
Nitrite: NEGATIVE
PH: 5 (ref 5.0–8.0)
Protein, ur: NEGATIVE mg/dL
Specific Gravity, Urine: 1.016 (ref 1.005–1.030)

## 2019-01-13 LAB — SURGICAL PCR SCREEN
MRSA, PCR: NEGATIVE
Staphylococcus aureus: NEGATIVE

## 2019-01-13 LAB — I-STAT BETA HCG BLOOD, ED (MC, WL, AP ONLY): I-stat hCG, quantitative: <5 m[IU]/mL (ref <5)

## 2019-01-13 LAB — CBC
HCT: 43.2 % (ref 36.0–46.0)
Hemoglobin: 13 g/dL (ref 12.0–15.0)
MCH: 22.9 pg — ABNORMAL LOW (ref 26.0–34.0)
MCHC: 30.1 g/dL (ref 30.0–36.0)
MCV: 76.2 fL — ABNORMAL LOW (ref 80.0–100.0)
NRBC: 0 % (ref 0.0–0.2)
Platelets: 334 10*3/uL (ref 150–400)
RBC: 5.67 MIL/uL — ABNORMAL HIGH (ref 3.87–5.11)
RDW: 14.8 % (ref 11.5–15.5)
WBC: 7.8 10*3/uL (ref 4.0–10.5)

## 2019-01-13 LAB — LIPASE, BLOOD: Lipase: 40 U/L (ref 11–51)

## 2019-01-13 MED ORDER — SODIUM CHLORIDE 0.9 % IV SOLN
2.0000 g | INTRAVENOUS | Status: DC
  Administered 2019-01-13 – 2019-01-14 (×2): 2 g via INTRAVENOUS
  Filled 2019-01-13 (×2): qty 2
  Filled 2019-01-13: qty 20

## 2019-01-13 MED ORDER — MORPHINE SULFATE (PF) 4 MG/ML IV SOLN
6.0000 mg | INTRAVENOUS | Status: DC | PRN
  Administered 2019-01-13: 6 mg via INTRAVENOUS
  Filled 2019-01-13: qty 2

## 2019-01-13 MED ORDER — MORPHINE SULFATE (PF) 2 MG/ML IV SOLN
1.0000 mg | INTRAVENOUS | Status: DC | PRN
  Administered 2019-01-13: 4 mg via INTRAVENOUS
  Administered 2019-01-14: 2 mg via INTRAVENOUS
  Administered 2019-01-14: 4 mg via INTRAVENOUS
  Filled 2019-01-13: qty 2
  Filled 2019-01-13: qty 1
  Filled 2019-01-13: qty 2

## 2019-01-13 MED ORDER — SIMETHICONE 80 MG PO CHEW: 40.0000 mg | CHEWABLE_TABLET | Freq: Four times a day (QID) | ORAL | Status: DC | PRN

## 2019-01-13 MED ORDER — DIPHENHYDRAMINE HCL 50 MG/ML IJ SOLN: 25.0000 mg | Freq: Four times a day (QID) | INTRAMUSCULAR | Status: DC | PRN

## 2019-01-13 MED ORDER — POLYETHYLENE GLYCOL 3350 17 G PO PACK: 17.0000 g | PACK | Freq: Every day | ORAL | Status: DC | PRN

## 2019-01-13 MED ORDER — FAMOTIDINE 20 MG PO TABS
20.0000 mg | ORAL_TABLET | Freq: Two times a day (BID) | ORAL | Status: DC
  Administered 2019-01-13 – 2019-01-15 (×4): 20 mg via ORAL
  Filled 2019-01-13 (×4): qty 1

## 2019-01-13 MED ORDER — LISINOPRIL 20 MG PO TABS
20.0000 mg | ORAL_TABLET | Freq: Every day | ORAL | Status: DC
  Administered 2019-01-15: 20 mg via ORAL
  Filled 2019-01-13: qty 1

## 2019-01-13 MED ORDER — HYDROCHLOROTHIAZIDE 12.5 MG PO CAPS: 12.5000 mg | ORAL_CAPSULE | Freq: Every day | ORAL | Status: DC

## 2019-01-13 MED ORDER — DIPHENHYDRAMINE HCL 25 MG PO CAPS: 25.0000 mg | ORAL_CAPSULE | Freq: Four times a day (QID) | ORAL | Status: DC | PRN

## 2019-01-13 MED ORDER — ACETAMINOPHEN 500 MG PO TABS
1000.0000 mg | ORAL_TABLET | Freq: Four times a day (QID) | ORAL | Status: DC
  Administered 2019-01-13 – 2019-01-15 (×7): 1000 mg via ORAL
  Filled 2019-01-13 (×8): qty 2

## 2019-01-13 MED ORDER — NICOTINE 14 MG/24HR TD PT24
14.0000 mg | MEDICATED_PATCH | Freq: Every day | TRANSDERMAL | Status: DC | PRN
  Administered 2019-01-13: 14 mg via TRANSDERMAL
  Filled 2019-01-13: qty 1

## 2019-01-13 MED ORDER — SODIUM CHLORIDE 0.9% FLUSH
3.0000 mL | Freq: Once | INTRAVENOUS | Status: AC
  Administered 2019-01-13: 3 mL via INTRAVENOUS

## 2019-01-13 MED ORDER — ONDANSETRON 4 MG PO TBDP: 4.0000 mg | ORAL_TABLET | Freq: Four times a day (QID) | ORAL | Status: DC | PRN

## 2019-01-13 MED ORDER — LISINOPRIL 20 MG PO TABS: 20.0000 mg | ORAL_TABLET | Freq: Every day | ORAL | Status: DC

## 2019-01-13 MED ORDER — LISINOPRIL-HYDROCHLOROTHIAZIDE 20-12.5 MG PO TABS: 1.0000 | ORAL_TABLET | Freq: Every day | ORAL | Status: DC

## 2019-01-13 MED ORDER — KCL IN DEXTROSE-NACL 20-5-0.45 MEQ/L-%-% IV SOLN
INTRAVENOUS | Status: DC
  Administered 2019-01-13 – 2019-01-14 (×3): via INTRAVENOUS
  Filled 2019-01-13 (×3): qty 1000

## 2019-01-13 MED ORDER — SODIUM CHLORIDE 0.9 % IV BOLUS
1000.0000 mL | Freq: Once | INTRAVENOUS | Status: AC
  Administered 2019-01-13: 1000 mL via INTRAVENOUS

## 2019-01-13 MED ORDER — IBUPROFEN 200 MG PO TABS: 600.0000 mg | ORAL_TABLET | Freq: Three times a day (TID) | ORAL | Status: DC | PRN

## 2019-01-13 MED ORDER — ENOXAPARIN SODIUM 40 MG/0.4ML ~~LOC~~ SOLN
40.0000 mg | SUBCUTANEOUS | Status: DC
  Filled 2019-01-13: qty 0.4

## 2019-01-13 MED ORDER — HYDROCHLOROTHIAZIDE 12.5 MG PO CAPS
12.5000 mg | ORAL_CAPSULE | Freq: Every day | ORAL | Status: DC
  Administered 2019-01-15: 12.5 mg via ORAL
  Filled 2019-01-13: qty 1

## 2019-01-13 MED ORDER — ONDANSETRON HCL 4 MG/2ML IJ SOLN
4.0000 mg | Freq: Four times a day (QID) | INTRAMUSCULAR | Status: DC | PRN
  Administered 2019-01-13 – 2019-01-14 (×2): 4 mg via INTRAVENOUS
  Filled 2019-01-13 (×2): qty 2

## 2019-01-13 NOTE — ED Triage Notes (Signed)
Transported by [facility] from home-- RUQ pain that has been intermittent x 1 month. Pain has intensified within the last hour after attempting to take Ibuprofen 800 mg. No other associated symptoms.

## 2019-01-13 NOTE — ED Notes (Signed)
ED TO INPATIENT HANDOFF REPORT  ED Nurse Name and Phone #: Vikki Ports RN [phone]  S Name/Age/Gender Brianna Gutierrez 43 y.o. female Room/Bed: WA13/WA13  Code Status   Code Status: Not on file  Home/SNF/Other Home Patient oriented to: self, place, time and situation Is this baseline? Yes   Triage Complete: Triage complete  Chief Complaint Abdominal Pain  Triage Note Transported by [facility] from home-- RUQ pain that has been intermittent x 1 month. Pain has intensified within the last hour after attempting to take Ibuprofen 800 mg. No other associated symptoms.    Allergies Allergies  Allergen Reactions  . Percocet [Oxycodone-Acetaminophen] Hives    Pt tolerates Hydrocodone  . Tramadol Other (See Comments)    Headache    Level of Care/Admitting Diagnosis ED Disposition    ED Disposition Condition Comment   Admit  Hospital Area: [facility] [[id number]]  Level of Care: Med-Surg [16]  Diagnosis: Cholelithiasis [962952]  Admitting Physician: CCS, [place]  Attending Physician: CCS, MD [[id number]]  Bed request comments: 5w  PT Class (Do Not Modify): Observation [104]  PT Acc Code (Do Not Modify): Observation [10022]       B Medical/Surgery History Past Medical History:  Diagnosis Date  . Borderline diabetes   . Hypertension    Past Surgical History:  Procedure Laterality Date  . CESAREAN SECTION    . DILITATION & CURRETTAGE/HYSTROSCOPY WITH HYDROTHERMAL ABLATION N/A 06/22/2016   Procedure: DILATATION & CURETTAGE/HYSTEROSCOPY WITH HYDROTHERMAL ABLATION;  Surgeon: Shelly Bombard, MD;  Location: [facility];  Service: Gynecology;  Laterality: N/A;  . TUBAL LIGATION       A IV Location/Drains/Wounds Patient Lines/Drains/Airways Status   Active Line/Drains/Airways    Name:   Placement date:   Placement time:   Site:   Days:   Peripheral IV 01/13/19 Right Forearm   01/13/19    0826    Forearm   less than 1   Incision (Closed) 06/22/16 Vagina   06/22/16     1156     935          Intake/Output Last 24 hours  Intake/Output Summary (Last 24 hours) at 01/13/2019 1105 Last data filed at 01/13/2019 1104 Gross per 24 hour  Intake 1000 ml  Output -  Net 1000 ml    Labs/Imaging Results for orders placed or performed during the hospital encounter of 01/13/19 (from the past 48 hour(s))  Lipase, blood     Status: None   Collection Time: 01/13/19  8:12 AM  Result Value Ref Range   Lipase 40 11 - 51 U/L    Comment: Performed at [facility], [place] [address], [place], [place] [address]  Comprehensive metabolic panel     Status: Abnormal   Collection Time: 01/13/19  8:12 AM  Result Value Ref Range   Sodium 136 135 - 145 mmol/L   Potassium 4.5 3.5 - 5.1 mmol/L   Chloride 105 98 - 111 mmol/L   CO2 21 (L) 22 - 32 mmol/L   Glucose, Bld 92 70 - 99 mg/dL   BUN 13 6 - 20 mg/dL   Creatinine, Ser 0.69 0.44 - 1.00 mg/dL   Calcium 8.8 (L) 8.9 - 10.3 mg/dL   Total Protein 7.7 6.5 - 8.1 g/dL   Albumin 3.7 3.5 - 5.0 g/dL   AST 14 (L) 15 - 41 U/L   ALT 15 0 - 44 U/L   Alkaline Phosphatase 81 38 - 126 U/L   Total Bilirubin 0.3  0.3 - 1.2 mg/dL   GFR calc non Af Amer >60 >60 mL/min   GFR calc Af Amer >60 >60 mL/min   Anion gap 10 5 - 15    Comment: Performed at [facility], [place] [address] [place], [place] [address]  CBC     Status: Abnormal   Collection Time: 01/13/19  8:12 AM  Result Value Ref Range   WBC 7.8 4.0 - 10.5 K/uL   RBC 5.67 (H) 3.87 - 5.11 MIL/uL   Hemoglobin 13.0 12.0 - 15.0 g/dL   HCT 43.2 36.0 - 46.0 %   MCV 76.2 (L) 80.0 - 100.0 fL   MCH 22.9 (L) 26.0 - 34.0 pg   MCHC 30.1 30.0 - 36.0 g/dL   RDW 14.8 11.5 - 15.5 %   Platelets 334 150 - 400 K/uL   nRBC 0.0 0.0 - 0.2 %    Comment: Performed at [facility], [place] [address], [place], [place] [address]  Urinalysis, Routine w reflex microscopic     Status: Abnormal   Collection Time: 01/13/19  8:12 AM  Result Value Ref Range    Color, Urine YELLOW YELLOW   APPearance CLEAR CLEAR   Specific Gravity, Urine 1.016 1.005 - 1.030   pH 5.0 5.0 - 8.0   Glucose, UA NEGATIVE NEGATIVE mg/dL   Hgb urine dipstick SMALL (A) NEGATIVE   Bilirubin Urine NEGATIVE NEGATIVE   Ketones, ur NEGATIVE NEGATIVE mg/dL   Protein, ur NEGATIVE NEGATIVE mg/dL   Nitrite NEGATIVE NEGATIVE   Leukocytes,Ua TRACE (A) NEGATIVE   RBC / HPF 0-5 0 - 5 RBC/hpf   WBC, UA 0-5 0 - 5 WBC/hpf   Bacteria, UA RARE (A) NONE SEEN   Squamous Epithelial / LPF 6-10 0 - 5   Mucus PRESENT     Comment: Performed at [facility], [place] [address] [place], [place] [address]  I-Stat beta hCG blood, ED     Status: None   Collection Time: 01/13/19  8:34 AM  Result Value Ref Range   I-stat hCG, quantitative <5.0 <5 mIU/mL   Comment 3            Comment:   GEST. AGE      CONC.  (mIU/mL)   <=1 WEEK        5 - 50     2 WEEKS       50 - 500     3 WEEKS       100 - 10,000     4 WEEKS     1,000 - 30,000        FEMALE AND NON-PREGNANT FEMALE:     LESS THAN 5 mIU/mL    No results found.  Pending Labs [employer] (From admission, onward)    Start     Ordered   Signed and Held  HIV antibody (Routine Testing)  Once,   R     Signed and Held   Signed and Held  Creatinine, serum  (enoxaparin (LOVENOX)    CrCl >/= 30 ml/min)  Weekly,   R    Comments:  while on enoxaparin therapy    Signed and Held   Signed and Held  Comprehensive metabolic panel  Tomorrow morning,   R     Signed and Held   Signed and Held  CBC  Tomorrow morning,   R     Signed and Held          Vitals/Pain Today's  Vitals   01/13/19 0805 01/13/19 0930  BP: (!) 158/72 (!) 158/102  Pulse: 86 71  Resp: 20 18  Temp: 98.3 F (36.8 C)   TempSrc: Oral   SpO2: 100% 94%  Weight: 74.4 kg   Height: 5' (1.524 m)     Isolation Precautions No active isolations  Medications Medications  morphine 4 MG/ML injection 6 mg (6 mg Intravenous Given 01/13/19 0900)  sodium chloride  flush (NS) 0.9 % injection 3 mL (3 mLs Intravenous Given 01/13/19 0832)  sodium chloride 0.9 % bolus 1,000 mL (0 mLs Intravenous Stopped 01/13/19 1104)    Mobility walks Low fall risk   Focused Assessments GI   R Recommendations: See Admitting Provider Note  Report given to: Jarrett Soho RN  Additional Notes:

## 2019-01-13 NOTE — ED Notes (Signed)
Patient provided with bedside commode at this time.

## 2019-01-13 NOTE — ED Provider Notes (Signed)
[facility] Provider Note   CSN: [id number] Arrival date & time: 01/13/19  0801    History   Chief Complaint Chief Complaint  Patient presents with  . Abdominal Pain    HPI Brianna Gutierrez is a 43 y.o. female.     HPI  43 year old female comes in with chief complaint of abdominal pain.  Patient has known cholelithiasis and she has history of hypertension and borderline diabetes.  Patient reports that she was diagnosed with cholelithiasis earlier in the year.  She had an outpatient follow-up but she was unable to schedule surgery because of cost constraints.  She reports that she has been having intermittent pain since that time, however this morning the pain woke her up and has been severe.  She also has associated nausea without vomiting.  Pain is in the right upper quadrant and colicky nature.  There is no associated UTI-like symptoms there is no history of kidney stones or pelvic disorders.  Past Medical History:  Diagnosis Date  . Borderline diabetes   . Hypertension     Patient Active Problem List   Diagnosis Date Noted  . Cholelithiasis 01/13/2019  . Pre-diabetes 11/15/2016  . Uterine fibroid 04/27/2016  . Abnormal uterine bleeding (AUB) 09/28/2015  . History of bilateral tubal ligation 09/28/2015  . Tobacco abuse 09/28/2015    Past Surgical History:  Procedure Laterality Date  . CESAREAN SECTION    . DILITATION & CURRETTAGE/HYSTROSCOPY WITH HYDROTHERMAL ABLATION N/A 06/22/2016   Procedure: DILATATION & CURETTAGE/HYSTEROSCOPY WITH HYDROTHERMAL ABLATION;  Surgeon: Shelly Bombard, MD;  Location: [facility];  Service: Gynecology;  Laterality: N/A;  . TUBAL LIGATION       OB History    Gravida  4   Para      Term      Preterm      AB      Living  4     SAB      TAB      Ectopic      Multiple      Live Births  4            Home Medications    Prior to Admission medications   Medication Sig Start  Date End Date Taking? Authorizing Provider  ibuprofen (ADVIL,MOTRIN) 200 MG tablet Take 600 mg by mouth daily as needed for moderate pain.   Yes [provider]  lisinopril-hydrochlorothiazide (ZESTORETIC) 20-12.5 MG tablet Take 1 tablet by mouth daily. 12/13/18  Yes Raylene Everts, MD  omeprazole (PRILOSEC) 20 MG capsule Take 1 capsule (20 mg total) by mouth daily. 01/01/19  Yes Scot Jun, FNP    Family History Family History  Problem Relation Age of Onset  . Diabetes Other   . Hypertension Other     Social History Social History   Tobacco Use  . Smoking status: Current Every Day Smoker    Types: Cigarettes    Last attempt to quit: 07/15/2014    Years since quitting: 4.5  . Smokeless tobacco: Never Used  Substance Use Topics  . Alcohol use: Not Currently    Comment: socially  . Drug use: No     Allergies   Percocet [oxycodone-acetaminophen] and Tramadol   Review of Systems Review of Systems  Constitutional: Negative for activity change.  Gastrointestinal: Positive for abdominal pain.  Genitourinary: Negative for dysuria.  Neurological: Negative for headaches.     Physical Exam Updated Vital Signs BP (!) 155/103 (BP Location:  Left Arm)   Pulse (!) 58   Temp 98.2 F (36.8 C) (Oral)   Resp 18   Ht 5' (1.524 m)   Wt 74.4 kg   SpO2 99%   BMI 32.03 kg/m   Physical Exam Vitals signs and nursing note reviewed.  Constitutional:      Appearance: She is well-developed.  HENT:     Head: Normocephalic and atraumatic.  Neck:     Musculoskeletal: Normal range of motion and neck supple.  Cardiovascular:     Rate and Rhythm: Normal rate.  Pulmonary:     Effort: Pulmonary effort is normal.  Abdominal:     General: Bowel sounds are normal.     Tenderness: There is abdominal tenderness in the right upper quadrant. There is guarding. There is no rebound. Positive signs include Murphy's sign.  Skin:    General: Skin is warm and dry.  Neurological:      Mental Status: She is alert and oriented to person, place, and time.      ED Treatments / Results  Labs (all labs ordered are listed, but only abnormal results are displayed) Labs Reviewed  COMPREHENSIVE METABOLIC PANEL - Abnormal; Notable for the following components:      Result Value   CO2 21 (*)    Calcium 8.8 (*)    AST 14 (*)    All other components within normal limits  CBC - Abnormal; Notable for the following components:   RBC 5.67 (*)    MCV 76.2 (*)    MCH 22.9 (*)    All other components within normal limits  URINALYSIS, ROUTINE W REFLEX MICROSCOPIC - Abnormal; Notable for the following components:   Hgb urine dipstick SMALL (*)    Leukocytes,Ua TRACE (*)    Bacteria, UA RARE (*)    All other components within normal limits  LIPASE, BLOOD  HIV ANTIBODY (ROUTINE TESTING W REFLEX)  I-STAT BETA HCG BLOOD, ED ([facility], [facility], [facility] ONLY)    EKG None  Radiology No results found.  Procedures Procedures (including critical care time)  Medications Ordered in ED Medications  ibuprofen (ADVIL,MOTRIN) tablet 600 mg (has no administration in time range)  enoxaparin (LOVENOX) injection 40 mg (has no administration in time range)  dextrose 5 % and 0.45 % NaCl with KCl 20 mEq/L infusion (has no administration in time range)  cefTRIAXone (ROCEPHIN) 2 g in sodium chloride 0.9 % 100 mL IVPB (has no administration in time range)  acetaminophen (TYLENOL) tablet 1,000 mg (has no administration in time range)  morphine 2 MG/ML injection 1-4 mg (has no administration in time range)  diphenhydrAMINE (BENADRYL) capsule 25 mg (has no administration in time range)    Or  diphenhydrAMINE (BENADRYL) injection 25 mg (has no administration in time range)  polyethylene glycol (MIRALAX / GLYCOLAX) packet 17 g (has no administration in time range)  ondansetron (ZOFRAN-ODT) disintegrating tablet 4 mg (has no administration in time range)    Or  ondansetron (ZOFRAN) injection 4 mg (has no  administration in time range)  simethicone (MYLICON) chewable tablet 40 mg (has no administration in time range)  nicotine (NICODERM CQ - dosed in mg/24 hours) patch 14 mg (has no administration in time range)  famotidine (PEPCID) tablet 20 mg (has no administration in time range)  lisinopril (PRINIVIL,ZESTRIL) tablet 20 mg (has no administration in time range)    And  hydrochlorothiazide (MICROZIDE) capsule 12.5 mg (has no administration in time range)  sodium chloride flush (NS) 0.9 % injection  3 mL (3 mLs Intravenous Given 01/13/19 0832)  sodium chloride 0.9 % bolus 1,000 mL (0 mLs Intravenous Stopped 01/13/19 1104)     Initial Impression / Assessment and Plan / ED Course  I have reviewed the triage vital signs and the nursing notes.  Pertinent labs & imaging results that were available during my care of the patient were reviewed by me and considered in my medical decision making (see chart for details).        DDx includes: Pancreatitis Hepatobiliary pathology including cholecystitis Gastritis/PUD SBO ACS syndrome  Pt comes in with cc of abd pain. Known cholelithiasis - labs reassuring. Surgery consulted for symptomatic cholelithiasis and they will admit.  Final Clinical Impressions(s) / ED Diagnoses   Final diagnoses:  Symptomatic cholelithiasis    ED Discharge Orders    None       Varney Biles, MD 01/13/19 1207

## 2019-01-13 NOTE — H&P (Signed)
[name] June 20, 1976  [id number].    Chief Complaint/Reason for Consult: biliary colic  HPI:  This is a 43 year old black female with a history of hypertension as well as prediabetes who presents to the emergency department today with a one-month history of intermittent right upper quadrant abdominal pain.  She states that she has been seen in the emergency department one time before and was diagnosed with a gallstone in the neck of her gallbladder.  Her symptoms resolved with some medical therapy and was discharged home.  She apparently followed up in our office but her co-pay for surgery was too high and has not pursued setting up elective surgery.    This morning at 4:30 AM she awoke from sleep with once again epigastric and right upper quadrant abdominal pain that radiated up into her chest and to her back.  She has had persistent nausea but no vomiting.  She denies any fevers.  She has taken ibuprofen with no relief.  She presents to the emergency department where she has been found to have normal labs.  A repeat ultrasound has not been obtained as she just had one on March 4 revealing a 2.2 cm gallstone lodged in the neck of her gallbladder.  We have been asked to evaluate the patient for surgical admission.  ROS: ROS: Please see HPI otherwise all other systems have been reviewed and are negative.  Family History  Problem Relation Age of Onset  . Diabetes Other   . Hypertension Other     Past Medical History:  Diagnosis Date  . Borderline diabetes   . Hypertension     Past Surgical History:  Procedure Laterality Date  . CESAREAN SECTION    . DILITATION & CURRETTAGE/HYSTROSCOPY WITH HYDROTHERMAL ABLATION N/A 06/22/2016   Procedure: DILATATION & CURETTAGE/HYSTEROSCOPY WITH HYDROTHERMAL ABLATION;  Surgeon: Shelly Bombard, MD;  Location: [facility];  Service: Gynecology;  Laterality: N/A;  . TUBAL LIGATION      Social History:  reports that she has been smoking cigarettes.  She has never used smokeless tobacco. She reports previous alcohol use. She reports that she does not use drugs.  Allergies:  Allergies  Allergen Reactions  . Percocet [Oxycodone-Acetaminophen] Hives    Pt tolerates Hydrocodone  . Tramadol Other (See Comments)    Headache    (Not in a hospital admission)    Physical Exam: Blood pressure (!) 158/102, pulse 71, temperature 98.3 F (36.8 C), temperature source Oral, resp. rate 18, height 5' (1.524 m), weight 74.4 kg, SpO2 94 %. General: pleasant, WD, WN black female who is laying in bed in mild distress secondary to pain. HEENT: head is normocephalic, atraumatic.  Sclera are noninjected.  PERRL.  Ears and nose without any masses or lesions.  Mouth is pink and moist Heart: regular, rate, and rhythm.  Normal s1,s2. No obvious murmurs, gallops, or rubs noted.  Palpable radial and pedal pulses bilaterally Lungs: CTAB, no wheezes, rhonchi, or rales noted.  Respiratory effort nonlabored Abd: soft, tender in the right upper quadrant with a positive Murphy sign, ND, +BS, no masses, hernias, or organomegaly MS: all 4 extremities are symmetrical with no cyanosis, clubbing, or edema. Skin: warm and dry with no masses, lesions, or rashes Psych: A&Ox3 with an appropriate affect.   Results for orders placed or performed during the hospital encounter of 01/13/19 (from the past 48 hour(s))  Lipase, blood     Status: None   Collection Time: 01/13/19  8:12 AM  Result Value Ref Range   Lipase 40 11 - 51 U/L    Comment: Performed at [facility], [place] [address] [place], [place] [address]  Comprehensive metabolic panel     Status: Abnormal   Collection Time: 01/13/19  8:12 AM  Result Value Ref Range   Sodium 136 135 - 145 mmol/L   Potassium 4.5 3.5 - 5.1 mmol/L   Chloride 105 98 - 111 mmol/L   CO2 21 (L) 22 - 32 mmol/L   Glucose, Bld 92 70 - 99 mg/dL   BUN 13 6 - 20 mg/dL   Creatinine, Ser 0.69 0.44 - 1.00 mg/dL   Calcium 8.8  (L) 8.9 - 10.3 mg/dL   Total Protein 7.7 6.5 - 8.1 g/dL   Albumin 3.7 3.5 - 5.0 g/dL   AST 14 (L) 15 - 41 U/L   ALT 15 0 - 44 U/L   Alkaline Phosphatase 81 38 - 126 U/L   Total Bilirubin 0.3 0.3 - 1.2 mg/dL   GFR calc non Af Amer >60 >60 mL/min   GFR calc Af Amer >60 >60 mL/min   Anion gap 10 5 - 15    Comment: Performed at [facility], [place] [address], [place], [place] [address]  CBC     Status: Abnormal   Collection Time: 01/13/19  8:12 AM  Result Value Ref Range   WBC 7.8 4.0 - 10.5 K/uL   RBC 5.67 (H) 3.87 - 5.11 MIL/uL   Hemoglobin 13.0 12.0 - 15.0 g/dL   HCT 43.2 36.0 - 46.0 %   MCV 76.2 (L) 80.0 - 100.0 fL   MCH 22.9 (L) 26.0 - 34.0 pg   MCHC 30.1 30.0 - 36.0 g/dL   RDW 14.8 11.5 - 15.5 %   Platelets 334 150 - 400 K/uL   nRBC 0.0 0.0 - 0.2 %    Comment: Performed at [facility], [place] [address], [place], [place] [address]  Urinalysis, Routine w reflex microscopic     Status: Abnormal   Collection Time: 01/13/19  8:12 AM  Result Value Ref Range   Color, Urine YELLOW YELLOW   APPearance CLEAR CLEAR   Specific Gravity, Urine 1.016 1.005 - 1.030   pH 5.0 5.0 - 8.0   Glucose, UA NEGATIVE NEGATIVE mg/dL   Hgb urine dipstick SMALL (A) NEGATIVE   Bilirubin Urine NEGATIVE NEGATIVE   Ketones, ur NEGATIVE NEGATIVE mg/dL   Protein, ur NEGATIVE NEGATIVE mg/dL   Nitrite NEGATIVE NEGATIVE   Leukocytes,Ua TRACE (A) NEGATIVE   RBC / HPF 0-5 0 - 5 RBC/hpf   WBC, UA 0-5 0 - 5 WBC/hpf   Bacteria, UA RARE (A) NONE SEEN   Squamous Epithelial / LPF 6-10 0 - 5   Mucus PRESENT     Comment: Performed at [facility], [place] [address] [place], [place] [address]  I-Stat beta hCG blood, ED     Status: None   Collection Time: 01/13/19  8:34 AM  Result Value Ref Range   I-stat hCG, quantitative <5.0 <5 mIU/mL   Comment 3            Comment:   GEST. AGE      CONC.  (mIU/mL)   <=1 WEEK        5 - 50     2 WEEKS       50 - 500     3  WEEKS       100 -  10,000     4 WEEKS     1,000 - 30,000        FEMALE AND NON-PREGNANT FEMALE:     LESS THAN 5 mIU/mL    No results found.    Assessment/Plan Biliary colic, possible early cholecystitis The patient does not have an elevation of her white blood cell count or her liver function test.  She has a known 2.2 cm gallstone lodged in the neck of her gallbladder.  However she does have pretty significant right upper quadrant abdominal tenderness.  We will go ahead and get her admitted and plan for laparoscopic cholecystectomy this admission.  I will go ahead and prophylactically start her on IV Rocephin for possible early cholecystitis despite a normal white blood cell count.  She will remain n.p.o. for right now until we can determine timing of her surgery.  If it will not be until tomorrow we will let her have clear liquids today and n.p.o. after midnight.  This is all been discussed with the patient and she understands and all questions have been answered.   FEN -IV fluids, n.p.o. VTE -Lovenox to start tomorrow, SCDs ID -Rocephin  Henreitta Cea, [facility] Surgery 01/13/2019, 10:41 AM Pager: [phone]

## 2019-01-13 NOTE — ED Notes (Signed)
Bed: LM76 Expected date:  Expected time:  Means of arrival:  Comments: EMS-gallstones

## 2019-01-13 NOTE — Progress Notes (Signed)
Received report from ED RN at 1102.

## 2019-01-14 ENCOUNTER — Observation Stay (HOSPITAL_COMMUNITY): Payer: Self-pay | Admitting: Anesthesiology

## 2019-01-14 ENCOUNTER — Encounter (HOSPITAL_COMMUNITY): Payer: Self-pay | Admitting: *Deleted

## 2019-01-14 ENCOUNTER — Observation Stay (HOSPITAL_COMMUNITY): Payer: Self-pay

## 2019-01-14 ENCOUNTER — Encounter (HOSPITAL_COMMUNITY)
Admission: EM | Disposition: A | Discharge status: Home / Self Care | Payer: Self-pay | Source: Home / Self Care | Attending: Emergency Medicine

## 2019-01-14 HISTORY — PX: CHOLECYSTECTOMY: SHX55

## 2019-01-14 LAB — COMPREHENSIVE METABOLIC PANEL
ALBUMIN: 2.9 g/dL — AB (ref 3.5–5.0)
ALT: 14 U/L (ref 0–44)
AST: 10 U/L — ABNORMAL LOW (ref 15–41)
Alkaline Phosphatase: 61 U/L (ref 38–126)
Anion gap: 6 (ref 5–15)
BUN: 5 mg/dL — AB (ref 6–20)
CO2: 23 mmol/L (ref 22–32)
Calcium: 8.3 mg/dL — ABNORMAL LOW (ref 8.9–10.3)
Chloride: 110 mmol/L (ref 98–111)
Creatinine, Ser: 0.66 mg/dL (ref 0.44–1.00)
GFR calc Af Amer: >60 mL/min (ref >60)
GFR calc non Af Amer: >60 mL/min (ref >60)
GLUCOSE: 105 mg/dL — AB (ref 70–99)
Potassium: 3.9 mmol/L (ref 3.5–5.1)
Sodium: 139 mmol/L (ref 135–145)
Total Bilirubin: 0.5 mg/dL (ref 0.3–1.2)
Total Protein: 6.2 g/dL — ABNORMAL LOW (ref 6.5–8.1)

## 2019-01-14 LAB — CBC
HCT: 38.5 % (ref 36.0–46.0)
Hemoglobin: 11.7 g/dL — ABNORMAL LOW (ref 12.0–15.0)
MCH: 23.2 pg — ABNORMAL LOW (ref 26.0–34.0)
MCHC: 30.4 g/dL (ref 30.0–36.0)
MCV: 76.2 fL — ABNORMAL LOW (ref 80.0–100.0)
Platelets: 313 10*3/uL (ref 150–400)
RBC: 5.05 MIL/uL (ref 3.87–5.11)
RDW: 14.9 % (ref 11.5–15.5)
WBC: 4.3 10*3/uL (ref 4.0–10.5)
nRBC: 0 % (ref 0.0–0.2)

## 2019-01-14 LAB — HIV ANTIBODY (ROUTINE TESTING W REFLEX): HIV Screen 4th Generation wRfx: NONREACTIVE

## 2019-01-14 SURGERY — LAPAROSCOPIC CHOLECYSTECTOMY WITH INTRAOPERATIVE CHOLANGIOGRAM
Anesthesia: General | Site: Abdomen

## 2019-01-14 MED ORDER — ESMOLOL HCL 100 MG/10ML IV SOLN
INTRAVENOUS | Status: AC
  Filled 2019-01-14: qty 10

## 2019-01-14 MED ORDER — LIDOCAINE 2% (20 MG/ML) 5 ML SYRINGE
INTRAMUSCULAR | Status: AC
  Filled 2019-01-14: qty 5

## 2019-01-14 MED ORDER — ONDANSETRON HCL 4 MG/2ML IJ SOLN
INTRAMUSCULAR | Status: DC | PRN
  Administered 2019-01-14: 4 mg via INTRAVENOUS

## 2019-01-14 MED ORDER — FENTANYL CITRATE (PF) 100 MCG/2ML IJ SOLN
INTRAMUSCULAR | Status: DC | PRN
  Administered 2019-01-14 (×2): 50 ug via INTRAVENOUS
  Administered 2019-01-14 (×2): 100 ug via INTRAVENOUS

## 2019-01-14 MED ORDER — SUCCINYLCHOLINE CHLORIDE 200 MG/10ML IV SOSY
PREFILLED_SYRINGE | INTRAVENOUS | Status: DC | PRN
  Administered 2019-01-14: 100 mg via INTRAVENOUS

## 2019-01-14 MED ORDER — ROCURONIUM BROMIDE 50 MG/5ML IV SOSY
PREFILLED_SYRINGE | INTRAVENOUS | Status: DC | PRN
  Administered 2019-01-14: 50 mg via INTRAVENOUS

## 2019-01-14 MED ORDER — METOCLOPRAMIDE HCL 5 MG/ML IJ SOLN: 10.0000 mg | Freq: Once | INTRAMUSCULAR | Status: DC | PRN

## 2019-01-14 MED ORDER — MIDAZOLAM HCL 5 MG/5ML IJ SOLN
INTRAMUSCULAR | Status: DC | PRN
  Administered 2019-01-14: 2 mg via INTRAVENOUS

## 2019-01-14 MED ORDER — LACTATED RINGERS IV SOLN
INTRAVENOUS | Status: DC
  Administered 2019-01-14 (×2): via INTRAVENOUS

## 2019-01-14 MED ORDER — MEPERIDINE HCL 50 MG/ML IJ SOLN: 6.2500 mg | INTRAMUSCULAR | Status: DC | PRN

## 2019-01-14 MED ORDER — FENTANYL CITRATE (PF) 100 MCG/2ML IJ SOLN
INTRAMUSCULAR | Status: AC
  Filled 2019-01-14: qty 2

## 2019-01-14 MED ORDER — PROMETHAZINE HCL 25 MG/ML IJ SOLN
25.0000 mg | INTRAMUSCULAR | Status: DC | PRN
  Administered 2019-01-14: 25 mg via INTRAVENOUS
  Filled 2019-01-14: qty 1

## 2019-01-14 MED ORDER — NICOTINE 14 MG/24HR TD PT24
14.0000 mg | MEDICATED_PATCH | Freq: Every day | TRANSDERMAL | Status: DC
  Administered 2019-01-14 – 2019-01-15 (×2): 14 mg via TRANSDERMAL
  Filled 2019-01-14 (×2): qty 1

## 2019-01-14 MED ORDER — BUPIVACAINE-EPINEPHRINE (PF) 0.25% -1:200000 IJ SOLN
INTRAMUSCULAR | Status: DC | PRN
  Administered 2019-01-14: 20 mL

## 2019-01-14 MED ORDER — LIDOCAINE 2% (20 MG/ML) 5 ML SYRINGE
INTRAMUSCULAR | Status: DC | PRN
  Administered 2019-01-14: 100 mg via INTRAVENOUS

## 2019-01-14 MED ORDER — HYDROMORPHONE HCL 1 MG/ML IJ SOLN
INTRAMUSCULAR | Status: AC
  Filled 2019-01-14: qty 1

## 2019-01-14 MED ORDER — CODEINE SULFATE 30 MG PO TABS
15.0000 mg | ORAL_TABLET | ORAL | Status: DC | PRN
  Administered 2019-01-15: 30 mg via ORAL
  Filled 2019-01-14: qty 1

## 2019-01-14 MED ORDER — IOHEXOL 300 MG/ML  SOLN
INTRAMUSCULAR | Status: DC | PRN
  Administered 2019-01-14: 7.5 mL

## 2019-01-14 MED ORDER — ONDANSETRON HCL 4 MG/2ML IJ SOLN
INTRAMUSCULAR | Status: AC
  Filled 2019-01-14: qty 2

## 2019-01-14 MED ORDER — 0.9 % SODIUM CHLORIDE (POUR BTL) OPTIME
TOPICAL | Status: DC | PRN
  Administered 2019-01-14: 1000 mL

## 2019-01-14 MED ORDER — SUGAMMADEX SODIUM 200 MG/2ML IV SOLN
INTRAVENOUS | Status: AC
  Filled 2019-01-14: qty 2

## 2019-01-14 MED ORDER — SUCCINYLCHOLINE CHLORIDE 200 MG/10ML IV SOSY
PREFILLED_SYRINGE | INTRAVENOUS | Status: AC
  Filled 2019-01-14: qty 10

## 2019-01-14 MED ORDER — ENOXAPARIN SODIUM 40 MG/0.4ML ~~LOC~~ SOLN
40.0000 mg | SUBCUTANEOUS | Status: DC
  Administered 2019-01-15: 40 mg via SUBCUTANEOUS
  Filled 2019-01-14: qty 0.4

## 2019-01-14 MED ORDER — PROPOFOL 10 MG/ML IV BOLUS
INTRAVENOUS | Status: AC
  Filled 2019-01-14: qty 20

## 2019-01-14 MED ORDER — SUGAMMADEX SODIUM 200 MG/2ML IV SOLN
INTRAVENOUS | Status: DC | PRN
  Administered 2019-01-14: 200 mg via INTRAVENOUS

## 2019-01-14 MED ORDER — DEXAMETHASONE SODIUM PHOSPHATE 10 MG/ML IJ SOLN
INTRAMUSCULAR | Status: AC
  Filled 2019-01-14: qty 1

## 2019-01-14 MED ORDER — LIP MEDEX EX OINT
TOPICAL_OINTMENT | CUTANEOUS | Status: AC
  Filled 2019-01-14: qty 7

## 2019-01-14 MED ORDER — KCL IN DEXTROSE-NACL 20-5-0.45 MEQ/L-%-% IV SOLN
INTRAVENOUS | Status: DC
  Administered 2019-01-14 – 2019-01-15 (×2): via INTRAVENOUS
  Filled 2019-01-14 (×2): qty 1000

## 2019-01-14 MED ORDER — DEXAMETHASONE SODIUM PHOSPHATE 10 MG/ML IJ SOLN
INTRAMUSCULAR | Status: DC | PRN
  Administered 2019-01-14: 10 mg via INTRAVENOUS

## 2019-01-14 MED ORDER — LACTATED RINGERS IR SOLN
Status: DC | PRN
  Administered 2019-01-14: 1000 mL

## 2019-01-14 MED ORDER — HYDROMORPHONE HCL 1 MG/ML IJ SOLN
1.0000 mg | INTRAMUSCULAR | Status: DC | PRN
  Administered 2019-01-14 – 2019-01-15 (×3): 1 mg via INTRAVENOUS
  Filled 2019-01-14 (×3): qty 1

## 2019-01-14 MED ORDER — HYDROMORPHONE HCL 1 MG/ML IJ SOLN
0.2500 mg | INTRAMUSCULAR | Status: DC | PRN
  Administered 2019-01-14 (×3): 0.5 mg via INTRAVENOUS

## 2019-01-14 MED ORDER — PROPOFOL 10 MG/ML IV BOLUS
INTRAVENOUS | Status: DC | PRN
  Administered 2019-01-14: 180 mg via INTRAVENOUS

## 2019-01-14 MED ORDER — ROCURONIUM BROMIDE 10 MG/ML (PF) SYRINGE
PREFILLED_SYRINGE | INTRAVENOUS | Status: AC
  Filled 2019-01-14: qty 10

## 2019-01-14 MED ORDER — BUPIVACAINE-EPINEPHRINE (PF) 0.25% -1:200000 IJ SOLN
INTRAMUSCULAR | Status: AC
  Filled 2019-01-14: qty 30

## 2019-01-14 MED ORDER — FENTANYL CITRATE (PF) 250 MCG/5ML IJ SOLN
INTRAMUSCULAR | Status: AC
  Filled 2019-01-14: qty 5

## 2019-01-14 MED ORDER — IBUPROFEN 200 MG PO TABS: 600.0000 mg | ORAL_TABLET | Freq: Four times a day (QID) | ORAL | Status: DC | PRN

## 2019-01-14 MED ORDER — MIDAZOLAM HCL 2 MG/2ML IJ SOLN
INTRAMUSCULAR | Status: AC
  Filled 2019-01-14: qty 2

## 2019-01-14 SURGICAL SUPPLY — 34 items
APPLIER CLIP ROT 10 11.4 M/L (STAPLE) ×3
CABLE HIGH FREQUENCY MONO STRZ (ELECTRODE) ×3 IMPLANT
CHLORAPREP W/TINT 26ML (MISCELLANEOUS) ×3 IMPLANT
CLIP APPLIE ROT 10 11.4 M/L (STAPLE) ×1 IMPLANT
CLOSURE WOUND 1/2 X4 (GAUZE/BANDAGES/DRESSINGS)
COVER MAYO STAND STRL (DRAPES) ×3 IMPLANT
COVER SURGICAL LIGHT HANDLE (MISCELLANEOUS) ×3 IMPLANT
COVER WAND RF STERILE (DRAPES) ×3 IMPLANT
DECANTER SPIKE VIAL GLASS SM (MISCELLANEOUS) ×3 IMPLANT
DERMABOND ADVANCED (GAUZE/BANDAGES/DRESSINGS) ×2
DERMABOND ADVANCED .7 DNX12 (GAUZE/BANDAGES/DRESSINGS) ×1 IMPLANT
DRAPE C-ARM 42X120 X-RAY (DRAPES) ×3 IMPLANT
ELECT REM PT RETURN 15FT ADLT (MISCELLANEOUS) ×3 IMPLANT
GAUZE SPONGE 2X2 8PLY STRL LF (GAUZE/BANDAGES/DRESSINGS) IMPLANT
GLOVE SURG ORTHO 8.0 STRL STRW (GLOVE) ×3 IMPLANT
GOWN STRL REUS W/TWL XL LVL3 (GOWN DISPOSABLE) ×12 IMPLANT
HEMOSTAT SURGICEL 4X8 (HEMOSTASIS) IMPLANT
KIT BASIN OR (CUSTOM PROCEDURE TRAY) ×3 IMPLANT
KIT TURNOVER KIT A (KITS) ×3 IMPLANT
POUCH SPECIMEN RETRIEVAL 10MM (ENDOMECHANICALS) ×3 IMPLANT
SCISSORS LAP 5X35 DISP (ENDOMECHANICALS) ×3 IMPLANT
SET CHOLANGIOGRAPH MIX (MISCELLANEOUS) ×3 IMPLANT
SET IRRIG TUBING LAPAROSCOPIC (IRRIGATION / IRRIGATOR) ×3 IMPLANT
SET TUBE SMOKE EVAC HIGH FLOW (TUBING) ×3 IMPLANT
SLEEVE XCEL OPT CAN 5 100 (ENDOMECHANICALS) ×3 IMPLANT
SPONGE GAUZE 2X2 STER 10/PKG (GAUZE/BANDAGES/DRESSINGS)
STRIP CLOSURE SKIN 1/2X4 (GAUZE/BANDAGES/DRESSINGS) IMPLANT
SUT MNCRL AB 4-0 PS2 18 (SUTURE) ×3 IMPLANT
TOWEL OR 17X26 10 PK STRL BLUE (TOWEL DISPOSABLE) ×3 IMPLANT
TOWEL OR NON WOVEN STRL DISP B (DISPOSABLE) ×3 IMPLANT
TRAY LAPAROSCOPIC (CUSTOM PROCEDURE TRAY) ×3 IMPLANT
TROCAR BLADELESS OPT 5 100 (ENDOMECHANICALS) ×3 IMPLANT
TROCAR XCEL BLUNT TIP 100MML (ENDOMECHANICALS) ×3 IMPLANT
TROCAR XCEL NON-BLD 11X100MML (ENDOMECHANICALS) ×3 IMPLANT

## 2019-01-14 NOTE — Anesthesia Postprocedure Evaluation (Signed)
Anesthesia Post Note  Patient: Therapist, art  Procedure(s) Performed: LAPAROSCOPIC CHOLECYSTECTOMY WITH POSSIBLE  INTRAOPERATIVE CHOLANGIOGRAM (N/A Abdomen)     Patient location during evaluation: PACU Anesthesia Type: General Level of consciousness: awake and alert Pain management: pain level controlled Vital Signs Assessment: post-procedure vital signs reviewed and stable Respiratory status: spontaneous breathing, nonlabored ventilation, respiratory function stable and patient connected to nasal cannula oxygen Cardiovascular status: blood pressure returned to baseline and stable Postop Assessment: no apparent nausea or vomiting Anesthetic complications: no    Last Vitals:  Vitals:   01/14/19 1500 01/14/19 1515  BP: (!) 160/99 (!) 147/96  Pulse: 74 87  Resp: 15 19  Temp:    SpO2: 94% 97%    Last Pain:  Vitals:   01/14/19 1515  TempSrc:   PainSc: Asleep                 Barnet Glasgow

## 2019-01-14 NOTE — Progress Notes (Signed)
Day of Surgery    CC: Abdominal pain  Subjective: Ongoing right upper quadrant pain, otherwise stable.  Objective: Vital signs in last 24 hours: Temp:  [97.7 F (36.5 C)-98.7 F (37.1 C)] 98.1 F (36.7 C) (03/18 0904) Pulse Rate:  [58-82] 61 (03/18 0904) Resp:  [14-18] 18 (03/18 0904) BP: (122-158)/(74-103) 131/93 (03/18 0904) SpO2:  [94 %-100 %] 96 % (03/18 0904) Last BM Date: 01/13/19 600 p.o. 3000 IV 2950 urine Afebrile vital signs are stable Labs are stable   Intake/Output from previous day: 03/17 0701 - 03/18 0700 In: 3575.3 [P.O.:600; I.V.:1875.3; IV Piggyback:1100] Out: 2950 [Urine:2950] Intake/Output this shift: Total I/O In: 0  Out: 200 [Urine:200]  General appearance: alert, cooperative and no distress Resp: clear to auscultation bilaterally GI: Tender right upper quadrant.  Lab Results:  Recent Labs    01/13/19 0812 01/14/19 0400  WBC 7.8 4.3  HGB 13.0 11.7*  HCT 43.2 38.5  PLT 334 313    BMET Recent Labs    01/13/19 0812 01/14/19 0400  NA 136 139  K 4.5 3.9  CL 105 110  CO2 21* 23  GLUCOSE 92 105*  BUN 13 5*  CREATININE 0.69 0.66  CALCIUM 8.8* 8.3*   PT/INR No results for input(s): LABPROT, INR in the last 72 hours.  Recent Labs  Lab 01/13/19 0812 01/14/19 0400  AST 14* 10*  ALT 15 14  ALKPHOS 81 61  BILITOT 0.3 0.5  PROT 7.7 6.2*  ALBUMIN 3.7 2.9*     Lipase     Component Value Date/Time   LIPASE 40 01/13/2019 0812     Prior to Admission medications   Medication Sig Start Date End Date Taking? Authorizing Provider  ibuprofen (ADVIL,MOTRIN) 200 MG tablet Take 600 mg by mouth daily as needed for moderate pain.   Yes [provider]  lisinopril-hydrochlorothiazide (ZESTORETIC) 20-12.5 MG tablet Take 1 tablet by mouth daily. 12/13/18  Yes Raylene Everts, MD  omeprazole (PRILOSEC) 20 MG capsule Take 1 capsule (20 mg total) by mouth daily. 01/01/19  Yes Scot Jun, FNP    Medications: . acetaminophen   1,000 mg Oral Q6H  . enoxaparin (LOVENOX) injection  40 mg Subcutaneous Q24H  . famotidine  20 mg Oral BID  . lisinopril  20 mg Oral Daily   And  . hydrochlorothiazide  12.5 mg Oral Daily  . lisinopril  20 mg Oral Daily   And  . hydrochlorothiazide  12.5 mg Oral Daily   . cefTRIAXone (ROCEPHIN)  IV 2 g (01/13/19 1236)  . dextrose 5 % and 0.45 % NaCl with KCl 20 mEq/L 125 mL/hr at 01/14/19 0453    Assessment/Plan Borderline diabetes Hypertension GERD BMI 32  Biliary colic/cholecystitis  FEN: IV fluids/n.p.o. ID: Rocephin/17 >>day 2  Due at 11:30 AM DVT:   Lovenox on hold Follow-up: Clinic  Plan: Surgery today.  LOS: 0 days    Brianna Gutierrez 01/14/2019 [phone]

## 2019-01-14 NOTE — Anesthesia Preprocedure Evaluation (Addendum)
Anesthesia Evaluation  Patient identified by MRN, date of birth, ID band Patient awake    Reviewed: Allergy & Precautions, NPO status , Patient's Chart, lab work & pertinent test results  Airway Mallampati: II  TM Distance: >3 FB Neck ROM: Full    Dental no notable dental hx. (+) Teeth Intact   Pulmonary Current Smoker,    Pulmonary exam normal breath sounds clear to auscultation       Cardiovascular hypertension, Normal cardiovascular exam Rhythm:Regular Rate:Normal     Neuro/Psych negative neurological ROS  negative psych ROS   GI/Hepatic Neg liver ROS, Cholecystitis    Endo/Other  Obesity  Renal/GU negative Renal ROS  negative genitourinary   Musculoskeletal negative musculoskeletal ROS (+)   Abdominal (+) + obese,   Peds  Hematology negative hematology ROS (+)   Anesthesia Other Findings   Reproductive/Obstetrics Uterine fibroids                            Anesthesia Physical Anesthesia Plan  ASA: II  Anesthesia Plan: General   Post-op Pain Management:    Induction: Intravenous, Rapid sequence and Cricoid pressure planned  PONV Risk Score and Plan: 4 or greater and Scopolamine patch - Pre-op, Midazolam, Ondansetron, Dexamethasone and Treatment may vary due to age or medical condition  Airway Management Planned: Oral ETT  Additional Equipment:   Intra-op Plan:   Post-operative Plan: Extubation in OR  Informed Consent: I have reviewed the patients History and Physical, chart, labs and discussed the procedure including the risks, benefits and alternatives for the proposed anesthesia with the patient or authorized representative who has indicated his/her understanding and acceptance.     Dental advisory given  Plan Discussed with: CRNA and Surgeon  Anesthesia Plan Comments:         Anesthesia Quick Evaluation

## 2019-01-14 NOTE — Op Note (Signed)
Procedure Note  Pre-operative Diagnosis:  Chronic cholecystitis, cholelithiasis, biliary colic  Post-operative Diagnosis:  same  Surgeon:  Armandina Gemma, MD  Assistant:  Annye English, MD   Procedure:  Laparoscopic cholecystectomy with intra-operative cholangiography  Anesthesia:  General  Estimated Blood Loss:  minimal  Drains: none         Specimen: gallbladder to pathology  Indications:  This is a 43 year old black female with a history of hypertension as well as prediabetes who presents to the emergency department today with a one-month history of intermittent right upper quadrant abdominal pain.  She states that she has been seen in the emergency department one time before and was diagnosed with a gallstone in the neck of her gallbladder.  Her symptoms resolved with some medical therapy and was discharged home.  She apparently followed up in our office but her co-pay for surgery was too high and has not pursued setting up elective surgery.  She awoke from sleep with once again epigastric and right upper quadrant abdominal pain that radiated up into her chest and to her back.  She has had persistent nausea but no vomiting.  She denies any fevers.  She has taken ibuprofen with no relief.  She presents to the emergency department where she has been found to have normal labs.  A repeat ultrasound has not been obtained as she just had one on March 4 revealing a 2.2 cm gallstone lodged in the neck of her gallbladder.  Patient now comes to surgery for cholecystectomy.  Procedure Details:  The patient was seen in the pre-op holding area. The risks, benefits, complications, treatment options, and expected outcomes were previously discussed with the patient. The patient agreed with the proposed plan and has signed the informed consent form.  The patient was transported to operating room # 2 at the [facility]. The patient was placed in the supine position on the operating room table.  Following induction of general anesthesia, the abdomen was prepped and draped in the usual aseptic fashion.  An incision was made in the skin near the umbilicus. The midline fascia was incised and the peritoneal cavity was entered and a Hasson cannula was introduced under direct vision. The cannula was secured with a 0-Vicryl pursestring suture. Pneumoperitoneum was established with carbon dioxide. Additional cannulae were introduced under direct vision along the right costal margin in the midline, mid-clavicular line, and anterior axillary line.   The gallbladder was identified and the fundus grasped and retracted cephalad. Adhesions were taken down bluntly and the electrocautery was utilized as needed, taking care not to involve any adjacent structures. The infundibulum was grasped and retracted laterally, exposing the peritoneum overlying the triangle of Calot. The peritoneum was incised and structures exposed with blunt dissection. The cystic duct was clearly identified, bluntly dissected circumferentially, and clipped at the neck of the gallbladder.  An incision was made in the cystic duct and the cholangiogram catheter introduced. The catheter was secured using an ligaclip.  Real-time cholangiography was performed using C-arm fluoroscopy.  There was rapid filling of a normal caliber common bile duct.  There was reflux of contrast into the left and right hepatic ductal systems.  There was free flow distally into the duodenum without filling defect or obstruction.  The catheter was removed from the peritoneal cavity.  The cystic duct was then ligated with ligaclips and divided. The cystic artery was identified, dissected circumferentially, ligated with ligaclips, and divided.  The gallbladder was dissected away from the gallbladder bed  using the electrocautery for hemostasis. The gallbladder was completely removed from the liver and placed into an endocatch bag. The gallbladder was removed in the  endocatch bag through the umbilical port site and submitted to pathology for review.  The right upper quadrant was irrigated and the gallbladder bed was inspected. Hemostasis was achieved with the electrocautery.  Cannulae were removed under direct vision and good hemostasis was noted. Pneumoperitoneum was released and the majority of the carbon dioxide evacuated. The umbilical wound was irrigated and the fascia was then closed with the pursestring suture.  Local anesthetic was infiltrated at all port sites. Skin incisions were closed with 4-0 Monocril subcuticular sutures and Dermabond was applied.  Instrument, sponge, and needle counts were correct at the conclusion of the case.  The patient was awakened from anesthesia and brought to the recovery room in stable condition.  The patient tolerated the procedure well.   Armandina Gemma, MD [facility] Office: [phone]

## 2019-01-14 NOTE — Transfer of Care (Signed)
Immediate Anesthesia Transfer of Care Note  Patient: Brianna Gutierrez, art  Procedure(s) Performed: LAPAROSCOPIC CHOLECYSTECTOMY WITH POSSIBLE  INTRAOPERATIVE CHOLANGIOGRAM (N/A Abdomen)  Patient Location: PACU  Anesthesia Type:General  Level of Consciousness: awake, alert  and oriented  Airway & Oxygen Therapy: Patient Spontanous Breathing and Patient connected to face mask oxygen  Post-op Assessment: Report given to RN and Post -op Vital signs reviewed and stable  Post vital signs: Reviewed and stable  Last Vitals:  Vitals Value Taken Time  BP 144/99 01/14/2019  2:19 PM  Temp    Pulse 72 01/14/2019  2:21 PM  Resp    SpO2 95 % 01/14/2019  2:21 PM  Vitals shown include unvalidated device data.  Last Pain:  Vitals:   01/14/19 1109  TempSrc: Oral  PainSc:       Patients Stated Pain Goal: 2 (66/91/67 5612)  Complications: No apparent anesthesia complications

## 2019-01-14 NOTE — Discharge Instructions (Signed)
[facility] ______[facility] °LAPAROSCOPIC SURGERY: POST OP INSTRUCTIONS °Always review your discharge instruction sheet given to you by the facility where your surgery was performed. °IF YOU HAVE DISABILITY OR FAMILY LEAVE FORMS, YOU MUST BRING THEM TO THE OFFICE FOR PROCESSING.   °DO NOT GIVE THEM TO YOUR DOCTOR. ° °1. A prescription for pain medication may be given to you upon discharge.  Take your pain medication as prescribed, if needed.  If narcotic pain medicine is not needed, then you may take acetaminophen (Tylenol) or ibuprofen (Advil) as needed. °2. Take your usually prescribed medications unless otherwise directed. °3. If you need a refill on your pain medication, please contact your pharmacy.  They will contact our office to request authorization. Prescriptions will not be filled after 5pm or on week-ends. °4. You should follow a light diet the first few days after arrival home, such as soup and crackers, etc.  Be sure to include lots of fluids daily. °5. Most patients will experience some swelling and bruising in the area of the incisions.  Ice packs will help.  Swelling and bruising can take several days to resolve.  °6. It is common to experience some constipation if taking pain medication after surgery.  Increasing fluid intake and taking a stool softener (such as Colace) will usually help or prevent this problem from occurring.  A mild laxative (Milk of Magnesia or Miralax) should be taken according to package instructions if there are no bowel movements after 48 hours. °7. Unless discharge instructions indicate otherwise, you may remove your bandages 24-48 hours after surgery, and you may shower at that time.  You may have steri-strips (small skin tapes) in place directly over the incision.  These strips should be left on the skin for 7-10 days.  If your surgeon used skin glue on the incision, you may shower in 24 hours.  The glue will flake off over the next 2-3 weeks.  Any sutures or  staples will be removed at the office during your follow-up visit. °8. ACTIVITIES:  You may resume regular (light) daily activities beginning the next day--such as daily self-care, walking, climbing stairs--gradually increasing activities as tolerated.  You may have sexual intercourse when it is comfortable.  Refrain from any heavy lifting or straining until approved by your doctor. °a. You may drive when you are no longer taking prescription pain medication, you can comfortably wear a seatbelt, and you can safely maneuver your car and apply brakes. °b. RETURN TO WORK:  __________________________________________________________ °9. You should see your doctor in the office for a follow-up appointment approximately 2-3 weeks after your surgery.  Make sure that you call for this appointment within a day or two after you arrive home to insure a convenient appointment time. °10. OTHER INSTRUCTIONS: __________________________________________________________________________________________________________________________ __________________________________________________________________________________________________________________________ °WHEN TO CALL YOUR DOCTOR: °1. Fever over 101.0 °2. Inability to urinate °3. Continued bleeding from incision. °4. Increased pain, redness, or drainage from the incision. °5. Increasing abdominal pain ° °The clinic staff is available to answer your questions during regular business hours.  Please don’t hesitate to call and ask to speak to one of the nurses for clinical concerns.  If you have a medical emergency, go to the nearest emergency room or call 911.  A surgeon from [facility] is always on call at the hospital. °[address], [address], [place], [place]  [address] ? [address] [address], [place], [place]   [address] °[phone] ? [phone] ? FAX [phone] °Web site:   [email or web address] °

## 2019-01-14 NOTE — Anesthesia Procedure Notes (Signed)
Procedure Name: Intubation Date/Time: 01/14/2019 1:06 PM Performed by: Maxwell Caul, CRNA Pre-anesthesia Checklist: Patient identified, Emergency Drugs available, Suction available and Patient being monitored Patient Re-evaluated:Patient Re-evaluated prior to induction Oxygen Delivery Method: Circle system utilized Preoxygenation: Pre-oxygenation with 100% oxygen Induction Type: IV induction, Rapid sequence and Cricoid Pressure applied Laryngoscope Size: Mac and 4 Grade View: Grade I Tube type: Oral Number of attempts: 1 Airway Equipment and Method: Stylet Placement Confirmation: ETT inserted through vocal cords under direct vision,  positive ETCO2 and breath sounds checked- equal and bilateral Secured at: 21 cm Tube secured with: Tape Dental Injury: Teeth and Oropharynx as per pre-operative assessment

## 2019-01-15 ENCOUNTER — Encounter (HOSPITAL_COMMUNITY): Payer: Self-pay | Admitting: Surgery

## 2019-01-15 ENCOUNTER — Ambulatory Visit: Payer: Self-pay

## 2019-01-15 LAB — CBC
HCT: 39 % (ref 36.0–46.0)
Hemoglobin: 11.9 g/dL — ABNORMAL LOW (ref 12.0–15.0)
MCH: 22.9 pg — ABNORMAL LOW (ref 26.0–34.0)
MCHC: 30.5 g/dL (ref 30.0–36.0)
MCV: 75.1 fL — ABNORMAL LOW (ref 80.0–100.0)
NRBC: 0 % (ref 0.0–0.2)
Platelets: 333 10*3/uL (ref 150–400)
RBC: 5.19 MIL/uL — ABNORMAL HIGH (ref 3.87–5.11)
RDW: 14.8 % (ref 11.5–15.5)
WBC: 13.9 10*3/uL — ABNORMAL HIGH (ref 4.0–10.5)

## 2019-01-15 LAB — COMPREHENSIVE METABOLIC PANEL
ALT: 44 U/L (ref 0–44)
AST: 36 U/L (ref 15–41)
Albumin: 3.4 g/dL — ABNORMAL LOW (ref 3.5–5.0)
Alkaline Phosphatase: 70 U/L (ref 38–126)
Anion gap: 8 (ref 5–15)
BUN: 8 mg/dL (ref 6–20)
CO2: 24 mmol/L (ref 22–32)
Calcium: 8.7 mg/dL — ABNORMAL LOW (ref 8.9–10.3)
Chloride: 104 mmol/L (ref 98–111)
Creatinine, Ser: 0.84 mg/dL (ref 0.44–1.00)
GFR calc Af Amer: >60 mL/min (ref >60)
GFR calc non Af Amer: >60 mL/min (ref >60)
Glucose, Bld: 126 mg/dL — ABNORMAL HIGH (ref 70–99)
Potassium: 3.8 mmol/L (ref 3.5–5.1)
Sodium: 136 mmol/L (ref 135–145)
Total Bilirubin: 0.5 mg/dL (ref 0.3–1.2)
Total Protein: 6.9 g/dL (ref 6.5–8.1)

## 2019-01-15 MED ORDER — HYDROMORPHONE HCL 1 MG/ML IJ SOLN: 1.0000 mg | INTRAMUSCULAR | Status: DC | PRN

## 2019-01-15 MED ORDER — CODEINE SULFATE 15 MG PO TABS: ORAL_TABLET | ORAL | 0 refills | Status: DC

## 2019-01-15 MED ORDER — IBUPROFEN 200 MG PO TABS
600.0000 mg | ORAL_TABLET | Freq: Four times a day (QID) | ORAL | Status: DC
  Administered 2019-01-15: 600 mg via ORAL
  Filled 2019-01-15: qty 3

## 2019-01-15 MED ORDER — IBUPROFEN 200 MG PO TABS: ORAL_TABLET | ORAL | Status: DC

## 2019-01-15 MED ORDER — OXYCODONE HCL 5 MG PO TABS: 5.0000 mg | ORAL_TABLET | Freq: Four times a day (QID) | ORAL | 0 refills | Status: DC | PRN

## 2019-01-15 MED ORDER — ACETAMINOPHEN 500 MG PO TABS: ORAL_TABLET | ORAL | 0 refills | Status: DC

## 2019-01-15 MED ORDER — POLYETHYLENE GLYCOL 3350 17 G PO PACK
17.0000 g | PACK | Freq: Every day | ORAL | Status: DC
  Administered 2019-01-15: 17 g via ORAL
  Filled 2019-01-15: qty 1

## 2019-01-15 NOTE — Progress Notes (Signed)
Assessment unchanged. Pt verbalized understanding of dc instructions through teach back including follow up care, meds to resume, and when to call the doctor. Discharged via foot per request to front entrance accompanied by sister and NT.

## 2019-01-15 NOTE — Discharge Summary (Signed)
Physician Discharge Summary  Patient ID: Brianna Gutierrez MRN: [id number] DOB/AGE: [birth year] 43 y.o.  Admit date: 01/13/2019 Discharge date: 01/15/2019  Admission Diagnoses:  Chronic cholecystitis, cholelithiasis since, biliary colic Borderline diabetes Hypertension GERD BMI 32  Discharge Diagnoses:  Same  Active Problems:   Cholelithiasis   PROCEDURES: Laparoscopic cholecystectomy with intraoperative cholangiogram 01/14/2019 Dr. Hyman Bible Course:  This is a 43 year old black female with a history of hypertension as well as prediabetes who presents to the emergency department today with a one-month history of intermittent right upper quadrant abdominal pain.  She states that she has been seen in the emergency department one time before and was diagnosed with a gallstone in the neck of her gallbladder.  Her symptoms resolved with some medical therapy and was discharged home.  She apparently followed up in our office but her co-pay for surgery was too high and has not pursued setting up elective surgery.    This morning at 4:30 AM she awoke from sleep with once again epigastric and right upper quadrant abdominal pain that radiated up into her chest and to her back.  She has had persistent nausea but no vomiting.  She denies any fevers.  She has taken ibuprofen with no relief.  She presents to the emergency department where she has been found to have normal labs.  A repeat ultrasound has not been obtained as she just had one on March 4 revealing a 2.2 cm gallstone lodged in the neck of her gallbladder.  We have been asked to evaluate the patient for surgical admission.  Patient was admitted and taken to the operating room on 01/14/2019.  He tolerated the procedure well.  Postoperatively she did well on the first postoperative day and was ready for discharge after lunch.  Condition on discharge: Improved  CBC Latest Ref Rng & Units 01/15/2019 01/14/2019 01/13/2019  WBC 4.0 - 10.5  K/uL 13.9(H) 4.3 7.8  Hemoglobin 12.0 - 15.0 g/dL 11.9(L) 11.7(L) 13.0  Hematocrit 36.0 - 46.0 % 39.0 38.5 43.2  Platelets 150 - 400 K/uL 333 313 334   CMP Latest Ref Rng & Units 01/15/2019 01/14/2019 01/13/2019  Glucose 70 - 99 mg/dL 126(H) 105(H) 92  BUN 6 - 20 mg/dL 8 5(L) 13  Creatinine 0.44 - 1.00 mg/dL 0.84 0.66 0.69  Sodium 135 - 145 mmol/L 136 139 136  Potassium 3.5 - 5.1 mmol/L 3.8 3.9 4.5  Chloride 98 - 111 mmol/L 104 110 105  CO2 22 - 32 mmol/L 24 23 21(L)  Calcium 8.9 - 10.3 mg/dL 8.7(L) 8.3(L) 8.8(L)  Total Protein 6.5 - 8.1 g/dL 6.9 6.2(L) 7.7  Total Bilirubin 0.3 - 1.2 mg/dL 0.5 0.5 0.3  Alkaline Phos 38 - 126 U/L 70 61 81  AST 15 - 41 U/L 36 10(L) 14(L)  ALT 0 - 44 U/L 44 14 15    Right upper quadrant ultrasound 12/31/2018: 2.2 cm stone in the neck of the gallbladder wall thickening negative Murphy sign CBD 4.7 mm   Disposition: Discharge disposition: 01-Home or Self Care        Allergies as of 01/15/2019      Reactions   Percocet [oxycodone-acetaminophen] Hives   Pt tolerates Hydrocodone   Tramadol Other (See Comments)   Headache      Medication List    TAKE these medications   acetaminophen 500 MG tablet Commonly known as:  TYLENOL You can take 1000 mg every 8 hours for pain.  This is your first pain medication.  You can buy this over the counter at any drug store.  Do not take more than 4000 mg of Tylenol(acetaminophen) per day, it can harm your liver.   codeine 15 MG tablet Take 1 every 6 hours for pain not relieved by plain Tylenol or ibuprofen.   ibuprofen 200 MG tablet Commonly known as:  ADVIL,MOTRIN You can take 2 to 3 tablets every 6 hours as needed for pain.  Alternate with plain Tylenol/acetaminophen.  Do not exceed this limit.  You can buy this over-the-counter at any drugstore without a prescription. What changed:    how much to take  how to take this  when to take this  reasons to take this  additional instructions    lisinopril-hydrochlorothiazide 20-12.5 MG tablet Commonly known as:  Zestoretic Take 1 tablet by mouth daily.   omeprazole 20 MG capsule Commonly known as:  PRILOSEC Take 1 capsule (20 mg total) by mouth daily.      Follow-up Information    [facility] Follow up on 01/29/2019.   Specialty:  General Surgery Why:  Your follow up will be a phone call appointment  (due to the Corona virus to limit exposure.) Carlena Hurl will call you at [facility].  Please email a picture if you have an concerns with you wound to : [email or web address] Contact information: [place] [address] [phone]         Signed: Earnstine Regal 01/15/2019, 1:46 PM

## 2019-01-15 NOTE — Progress Notes (Addendum)
1 Day Post-Op    CC: Abdominal pain  Subjective: Patient continues to complain of abdominal pain and nausea.  He does not think she should go home until she has had a bowel movement.  I told her we would put her on plain Tylenol and ibuprofen She was started on the Tylenol postop, she is only had 1 dose of the codeine.  She had 5 doses of the Dilaudid yesterday and 1 this a.m.   Objective: Vital signs in last 24 hours: Temp:  [97.6 F (36.4 C)-98.9 F (37.2 C)] 98.9 F (37.2 C) (03/19 0625) Pulse Rate:  [65-87] 84 (03/19 0625) Resp:  [12-20] 18 (03/19 0625) BP: (111-160)/(65-102) 125/65 (03/19 0625) SpO2:  [91 %-100 %] 96 % (03/19 0625) Last BM Date: 01/12/19 600 p.o. 2608 IV 1650 urine 600 emesis No BM Afebrile blood pressure elevated intermittently WBC elevated 13.9 H/H is stable CMP is stableCMP is stable  Intake/Output from previous day: 03/18 0701 - 03/19 0700 In: 3308.9 [P.O.:600; I.V.:2608.9; IV Piggyback:100] Out: 0768 [Urine:1650; Blood:25] Intake/Output this shift: Total I/O In: 283.8 [I.V.:283.8] Out: -   General appearance: alert and no distress Resp: clear to auscultation bilaterally GI: Soft, sore, port sites look fine.  Lab Results:  Recent Labs    01/13/19 0812 01/14/19 0400  WBC 7.8 4.3  HGB 13.0 11.7*  HCT 43.2 38.5  PLT 334 313    BMET Recent Labs    01/13/19 0812 01/14/19 0400  NA 136 139  K 4.5 3.9  CL 105 110  CO2 21* 23  GLUCOSE 92 105*  BUN 13 5*  CREATININE 0.69 0.66  CALCIUM 8.8* 8.3*   PT/INR No results for input(s): LABPROT, INR in the last 72 hours.  Recent Labs  Lab 01/13/19 0812 01/14/19 0400  AST 14* 10*  ALT 15 14  ALKPHOS 81 61  BILITOT 0.3 0.5  PROT 7.7 6.2*  ALBUMIN 3.7 2.9*     Lipase     Component Value Date/Time   LIPASE 40 01/13/2019 0812     Medications: . acetaminophen  1,000 mg Oral Q6H  . enoxaparin (LOVENOX) injection  40 mg Subcutaneous Q24H  . famotidine  20 mg Oral BID  .  lisinopril  20 mg Oral Daily   And  . hydrochlorothiazide  12.5 mg Oral Daily  . nicotine  14 mg Transdermal Daily    Assessment/Plan Borderline diabetes Hypertension GERD BMI 32  Chronic cholecystitis cholelithiasis/biliary colic Laparoscopic cholecystectomy with intraoperative cholangiogram 01/14/2019, Dr. Armandina Gemma  FEN: IV fluids/n.p.o. ID: Rocephin/17 >>day 2  Due at 11:30 AM DVT:   Lovenox on hold Follow-up: Clinic  Plan: Convert her over to oral pain medications.  Saline lock her IV.  I will give her dose of MiraLAX this a.m. Mobilize her and see how she does.  If she dose well, aim for discharge  home later today.  LOS: 0 days    Kadence Mikkelson 01/15/2019 [phone]

## 2019-01-27 ENCOUNTER — Ambulatory Visit: Payer: Self-pay | Admitting: Family Medicine

## 2019-08-07 ENCOUNTER — Ambulatory Visit
Admission: EM | Admit: 2019-08-07 | Discharge: 2019-08-07 | Disposition: A | Discharge status: Home / Self Care | Payer: Self-pay | Attending: Emergency Medicine | Admitting: Emergency Medicine

## 2019-08-07 ENCOUNTER — Other Ambulatory Visit: Payer: Self-pay

## 2019-08-07 ENCOUNTER — Encounter: Payer: Self-pay | Admitting: Emergency Medicine

## 2019-08-07 DIAGNOSIS — N390 Urinary tract infection, site not specified: Secondary | ICD-10-CM

## 2019-08-07 DIAGNOSIS — B9689 Other specified bacterial agents as the cause of diseases classified elsewhere: Secondary | ICD-10-CM

## 2019-08-07 LAB — POCT URINALYSIS DIP (MANUAL ENTRY)
Bilirubin, UA: NEGATIVE
Glucose, UA: NEGATIVE mg/dL
Ketones, POC UA: NEGATIVE mg/dL
Nitrite, UA: NEGATIVE
Protein Ur, POC: 100 mg/dL — AB
Spec Grav, UA: 1.02 (ref 1.010–1.025)
Urobilinogen, UA: 1 E.U./dL
pH, UA: 7.5 (ref 5.0–8.0)

## 2019-08-07 MED ORDER — KETOROLAC TROMETHAMINE 60 MG/2ML IM SOLN
60.0000 mg | Freq: Once | INTRAMUSCULAR | Status: AC
  Administered 2019-08-07: 20:00:00 60 mg via INTRAMUSCULAR

## 2019-08-07 MED ORDER — CIPROFLOXACIN HCL 500 MG PO TABS: 500.0000 mg | ORAL_TABLET | Freq: Two times a day (BID) | ORAL | 0 refills | Status: DC

## 2019-08-07 NOTE — ED Notes (Signed)
This RN brought patient to room, who was on phone.  Patient states this RN can return so she can finish her phone call.  Will attempt triage again in 5 minutes.

## 2019-08-07 NOTE — Discharge Instructions (Addendum)
Take antibiotic as directed with food. Go to ER for worsening symptoms, pain, fever, blood in urine.

## 2019-08-07 NOTE — ED Notes (Signed)
Patient able to ambulate independently  

## 2019-08-07 NOTE — ED Triage Notes (Addendum)
Pt presents to [facility] for assessment of right flank pain starting 5 days ago with increased urinary frequency.  Denies any heavy lifting or strain.  States she has been taking Tylenol at home without relief.  Last dose at 1pm.

## 2019-08-07 NOTE — ED Provider Notes (Signed)
[facility]    CSN: [id number] Arrival date & time: 08/07/19  1912      History   Chief Complaint Chief Complaint  Patient presents with  . Flank Pain    HPI Brianna Gutierrez is a 43 y.o. female with history of hypertension, prediabetes for 5-day course of urinary frequency, right flank pain.  Denies pain similar to this previously.  Patient did undergo cholecystectomy without complication in March of this year.  Has not been seen by primary care since before surgery.  Patient denies hematuria, abdominal pain, nausea, vomiting.  No fever, known sick contacts, recent heavy lifting/straining.  Patient has tried Tylenol with minimal relief of pain.   Past Medical History:  Diagnosis Date  . Borderline diabetes   . Hypertension     Patient Active Problem List   Diagnosis Date Noted  . Cholelithiasis 01/13/2019  . Pre-diabetes 11/15/2016  . Uterine fibroid 04/27/2016  . Abnormal uterine bleeding (AUB) 09/28/2015  . History of bilateral tubal ligation 09/28/2015  . Tobacco abuse 09/28/2015    Past Surgical History:  Procedure Laterality Date  . CESAREAN SECTION    . CHOLECYSTECTOMY N/A 01/14/2019   Procedure: LAPAROSCOPIC CHOLECYSTECTOMY WITH POSSIBLE  INTRAOPERATIVE CHOLANGIOGRAM;  Surgeon: Armandina Gemma, MD;  Location: [facility];  Service: General;  Laterality: N/A;  . DILITATION & CURRETTAGE/HYSTROSCOPY WITH HYDROTHERMAL ABLATION N/A 06/22/2016   Procedure: DILATATION & CURETTAGE/HYSTEROSCOPY WITH HYDROTHERMAL ABLATION;  Surgeon: Shelly Bombard, MD;  Location: [facility];  Service: Gynecology;  Laterality: N/A;  . TUBAL LIGATION      OB History    Gravida  4   Para      Term      Preterm      AB      Living  4     SAB      TAB      Ectopic      Multiple      Live Births  4            Home Medications    Prior to Admission medications   Medication Sig Start Date End Date Taking? Authorizing Provider  acetaminophen (TYLENOL) 500 MG tablet  You can take 1000 mg every 8 hours for pain.  This is your first pain medication.  You can buy this over the counter at any drug store.  Do not take more than 4000 mg of Tylenol(acetaminophen) per day, it can harm your liver. 01/15/19   Earnstine Regal, PA-C  ciprofloxacin (CIPRO) 500 MG tablet Take 1 tablet (500 mg total) by mouth every 12 (twelve) hours. 08/07/19   Hall-Potvin, [place], PA-C  ibuprofen (ADVIL,MOTRIN) 200 MG tablet You can take 2 to 3 tablets every 6 hours as needed for pain.  Alternate with plain Tylenol/acetaminophen.  Do not exceed this limit.  You can buy this over-the-counter at any drugstore without a prescription. 01/15/19   Earnstine Regal, PA-C  lisinopril-hydrochlorothiazide (ZESTORETIC) 20-12.5 MG tablet Take 1 tablet by mouth daily. 12/13/18   Raylene Everts, MD  omeprazole (PRILOSEC) 20 MG capsule Take 1 capsule (20 mg total) by mouth daily. 01/01/19 08/07/19  Scot Jun, FNP    Family History Family History  Problem Relation Age of Onset  . Diabetes Other   . Hypertension Other     Social History Social History   Tobacco Use  . Smoking status: Current Every Day Smoker    Packs/day: 0.10    Types: Cigarettes    Last attempt to  quit: 07/15/2014    Years since quitting: 5.0  . Smokeless tobacco: Never Used  Substance Use Topics  . Alcohol use: Not Currently    Comment: socially  . Drug use: No     Allergies   Percocet [oxycodone-acetaminophen] and Tramadol   Review of Systems Review of Systems  Constitutional: Negative for fatigue and fever.  Respiratory: Negative for cough and shortness of breath.   Cardiovascular: Negative for chest pain and palpitations.  Gastrointestinal: Negative for constipation and diarrhea.  Genitourinary: Positive for flank pain and frequency. Negative for dysuria, hematuria, pelvic pain, vaginal bleeding, vaginal discharge and vaginal pain.     Physical Exam Triage Vital Signs ED Triage Vitals  Enc Vitals  Group     BP      Pulse      Resp      Temp      Temp src      SpO2      Weight      Height      Head Circumference      Peak Flow      Pain Score      Pain Loc      Pain Edu?      Excl. in [place]?    No data found.  Updated Vital Signs BP 138/88 (BP Location: Left Arm)   Pulse 76   Temp 98.1 F (36.7 C) (Oral)   Resp 18   SpO2 99%    Physical Exam Constitutional:      General: She is not in acute distress.    Appearance: She is obese. She is not toxic-appearing.  HENT:     Head: Normocephalic and atraumatic.     Mouth/Throat:     Mouth: Mucous membranes are moist.     Pharynx: Oropharynx is clear.  Eyes:     General: No scleral icterus.    Pupils: Pupils are equal, round, and reactive to light.  Cardiovascular:     Rate and Rhythm: Normal rate and regular rhythm.  Pulmonary:     Effort: Pulmonary effort is normal. No respiratory distress.     Breath sounds: No wheezing.  Abdominal:     General: Bowel sounds are normal.     Palpations: Abdomen is soft.     Tenderness: There is no abdominal tenderness. There is no right CVA tenderness or left CVA tenderness.  Musculoskeletal:        General: No deformity.     Right lower leg: No edema.     Left lower leg: No edema.  Skin:    Capillary Refill: Capillary refill takes less than 2 seconds.     Coloration: Skin is not jaundiced or pale.  Neurological:     General: No focal deficit present.     Mental Status: She is alert and oriented to person, place, and time.      UC Treatments / Results  Labs (all labs ordered are listed, but only abnormal results are displayed) Labs Reviewed  POCT URINALYSIS DIP (MANUAL ENTRY) - Abnormal; Notable for the following components:      Result Value   Clarity, UA cloudy (*)    Blood, UA trace-intact (*)    Protein Ur, POC =100 (*)    Leukocytes, UA Trace (*)    All other components within normal limits  URINE CULTURE    EKG   Radiology No results found.   Procedures Procedures (including critical care time)  Medications Ordered in UC Medications  ketorolac (TORADOL) injection 60 mg (has no administration in time range)    Initial Impression / Assessment and Plan / UC Course  I have reviewed the triage vital signs and the nursing notes.  Pertinent labs & imaging results that were available during my care of the patient were reviewed by me and considered in my medical decision making (see chart for details).     1.  Complicated UTI Urine dipstick done in office, reviewed by me: Positive for trace intact blood, protein, leukocytes.  Culture pending.  Will initiate coverage for complicated UTI with ciprofloxacin twice daily x5 days.  Patient to continue Tylenol, given IM Toradol in office today which she tolerated well for pain.  Return precautions discussed, patient verbalized understanding and is agreeable to plan. Final Clinical Impressions(s) / UC Diagnoses   Final diagnoses:  Complicated UTI (urinary tract infection)     Discharge Instructions     Take antibiotic as directed with food. Go to ER for worsening symptoms, pain, fever, blood in urine.    ED Prescriptions    Medication Sig Dispense Auth. Provider   ciprofloxacin (CIPRO) 500 MG tablet Take 1 tablet (500 mg total) by mouth every 12 (twelve) hours. 10 tablet Hall-Potvin, [place], PA-C     PDMP not reviewed this encounter.   Hall-Potvin, [place], [place] 08/07/19 1958

## 2019-08-11 LAB — URINE CULTURE: Culture: <10000 — AB

## 2019-08-16 ENCOUNTER — Ambulatory Visit
Admission: EM | Admit: 2019-08-16 | Discharge: 2019-08-16 | Disposition: A | Discharge status: Home / Self Care | Payer: Self-pay | Attending: Physician Assistant | Admitting: Physician Assistant

## 2019-08-16 ENCOUNTER — Other Ambulatory Visit: Payer: Self-pay

## 2019-08-16 ENCOUNTER — Encounter: Payer: Self-pay | Admitting: Emergency Medicine

## 2019-08-16 DIAGNOSIS — R05 Cough: Secondary | ICD-10-CM

## 2019-08-16 DIAGNOSIS — J029 Acute pharyngitis, unspecified: Secondary | ICD-10-CM

## 2019-08-16 DIAGNOSIS — R059 Cough, unspecified: Secondary | ICD-10-CM

## 2019-08-16 MED ORDER — FLUTICASONE PROPIONATE 50 MCG/ACT NA SUSP: 2.0000 | Freq: Every day | NASAL | 0 refills | Status: DC

## 2019-08-16 MED ORDER — BENZONATATE 100 MG PO CAPS: 100.0000 mg | ORAL_CAPSULE | Freq: Three times a day (TID) | ORAL | 0 refills | Status: DC

## 2019-08-16 NOTE — ED Notes (Signed)
Patient on phone when this RN went to get her, patient states she needs to finish her call.  Will have registration call this RN when patient ready

## 2019-08-16 NOTE — ED Triage Notes (Signed)
Pt presents to [facility] for assessment of nasal congestion, sinus pressure, and sore throat x 1 day.

## 2019-08-16 NOTE — Discharge Instructions (Signed)
As discussed, cannot rule out COVID. Currently, no alarming signs. Testing ordered. I would like you to quarantine until testing results. Tessalon if cough gets worse. Flonase for nasal congestion/drainage/sinus pressure. If experiencing shortness of breath, trouble breathing, go to the ED for further evaluation needed.

## 2019-08-16 NOTE — ED Notes (Signed)
Patient able to ambulate independently  

## 2019-08-16 NOTE — ED Provider Notes (Signed)
[name]    CSN: [id number] Arrival date & time: 08/16/19  1127      History   Chief Complaint Chief Complaint  Patient presents with  . Nasal Congestion  . Sore Throat    HPI Brianna Gutierrez is a 43 y.o. female.   43 year old female comes in for 1 day history of URI symptoms.  Has had sore throat, sinus pressure, nasal congestion, minimal cough.  Denies fever, chills, body aches.  Denies abdominal pain, nausea, vomiting, diarrhea.  Denies shortness of breath, loss of taste or smell.  No known sick or Covid contact.  Has been taking OTC cold medication with mild relief.     Past Medical History:  Diagnosis Date  . Borderline diabetes   . Hypertension     Patient Active Problem List   Diagnosis Date Noted  . Cholelithiasis 01/13/2019  . Pre-diabetes 11/15/2016  . Uterine fibroid 04/27/2016  . Abnormal uterine bleeding (AUB) 09/28/2015  . History of bilateral tubal ligation 09/28/2015  . Tobacco abuse 09/28/2015    Past Surgical History:  Procedure Laterality Date  . CESAREAN SECTION    . CHOLECYSTECTOMY N/A 01/14/2019   Procedure: LAPAROSCOPIC CHOLECYSTECTOMY WITH POSSIBLE  INTRAOPERATIVE CHOLANGIOGRAM;  Surgeon: Armandina Gemma, MD;  Location: [facility];  Service: General;  Laterality: N/A;  . DILITATION & CURRETTAGE/HYSTROSCOPY WITH HYDROTHERMAL ABLATION N/A 06/22/2016   Procedure: DILATATION & CURETTAGE/HYSTEROSCOPY WITH HYDROTHERMAL ABLATION;  Surgeon: Shelly Bombard, MD;  Location: [facility];  Service: Gynecology;  Laterality: N/A;  . TUBAL LIGATION      OB History    Gravida  4   Para      Term      Preterm      AB      Living  4     SAB      TAB      Ectopic      Multiple      Live Births  4            Home Medications    Prior to Admission medications   Medication Sig Start Date End Date Taking? Authorizing Provider  acetaminophen (TYLENOL) 500 MG tablet You can take 1000 mg every 8 hours for pain.  This is your first  pain medication.  You can buy this over the counter at any drug store.  Do not take more than 4000 mg of Tylenol(acetaminophen) per day, it can harm your liver. 01/15/19   Earnstine Regal, PA-C  benzonatate (TESSALON) 100 MG capsule Take 1 capsule (100 mg total) by mouth every 8 (eight) hours. 08/16/19   Tasia Catchings, Jonel Weldon V, PA-C  fluticasone (FLONASE) 50 MCG/ACT nasal spray Place 2 sprays into both nostrils daily. 08/16/19   Tasia Catchings, Elysabeth Aust V, PA-C  ibuprofen (ADVIL,MOTRIN) 200 MG tablet You can take 2 to 3 tablets every 6 hours as needed for pain.  Alternate with plain Tylenol/acetaminophen.  Do not exceed this limit.  You can buy this over-the-counter at any drugstore without a prescription. 01/15/19   Earnstine Regal, PA-C  lisinopril-hydrochlorothiazide (ZESTORETIC) 20-12.5 MG tablet Take 1 tablet by mouth daily. 12/13/18   Raylene Everts, MD  omeprazole (PRILOSEC) 20 MG capsule Take 1 capsule (20 mg total) by mouth daily. 01/01/19 08/07/19  Scot Jun, FNP    Family History Family History  Problem Relation Age of Onset  . Diabetes Other   . Hypertension Other     Social History Social History   Tobacco Use  . Smoking  status: Current Every Day Smoker    Packs/day: 0.10    Types: Cigarettes    Last attempt to quit: 07/15/2014    Years since quitting: 5.0  . Smokeless tobacco: Never Used  Substance Use Topics  . Alcohol use: Not Currently    Comment: socially  . Drug use: No     Allergies   Percocet [oxycodone-acetaminophen] and Tramadol   Review of Systems Review of Systems  Reason unable to perform ROS: See HPI as above.     Physical Exam Triage Vital Signs ED Triage Vitals  Enc Vitals Group     BP 08/16/19 1141 (!) 150/86     Pulse Rate 08/16/19 1141 89     Resp 08/16/19 1141 18     Temp 08/16/19 1141 97.8 F (36.6 C)     Temp Source 08/16/19 1141 Temporal     SpO2 08/16/19 1141 99 %     Weight --      Height --      Head Circumference --      Peak Flow --       Pain Score 08/16/19 1142 5     Pain Loc --      Pain Edu? --      Excl. in [place]? --    No data found.  Updated Vital Signs BP (!) 150/86 (BP Location: Left Arm)   Pulse 89   Temp 97.8 F (36.6 C) (Temporal)   Resp 18   SpO2 99%    Physical Exam Constitutional:      General: She is not in acute distress.    Appearance: Normal appearance. She is not ill-appearing, toxic-appearing or diaphoretic.  HENT:     Head: Normocephalic and atraumatic.     Nose: Rhinorrhea present.     Right Sinus: No maxillary sinus tenderness or frontal sinus tenderness.     Left Sinus: No maxillary sinus tenderness or frontal sinus tenderness.     Mouth/Throat:     Mouth: Mucous membranes are moist.     Pharynx: Oropharynx is clear. Uvula midline.     Tonsils: No tonsillar exudate.  Neck:     Musculoskeletal: Normal range of motion and neck supple.  Cardiovascular:     Rate and Rhythm: Normal rate and regular rhythm.     Heart sounds: Normal heart sounds. No murmur. No friction rub. No gallop.   Pulmonary:     Effort: Pulmonary effort is normal. No accessory muscle usage, prolonged expiration, respiratory distress or retractions.     Comments: Lungs clear to auscultation without adventitious lung sounds. Neurological:     General: No focal deficit present.     Mental Status: She is alert and oriented to person, place, and time.     UC Treatments / Results  Labs (all labs ordered are listed, but only abnormal results are displayed) Labs Reviewed  NOVEL CORONAVIRUS, NAA    EKG   Radiology No results found.  Procedures Procedures (including critical care time)  Medications Ordered in UC Medications - No data to display  Initial Impression / Assessment and Plan / UC Course  I have reviewed the triage vital signs and the nursing notes.  Pertinent labs & imaging results that were available during my care of the patient were reviewed by me and considered in my medical decision making (see  chart for details).    No alarming signs on exam.  Patient speaking in full sentences without respiratory distress.  COVID testing ordered.  Patient to quarantine until testing results return.  Symptomatic treatment discussed.  Push fluids.  Return precautions given.  Patient expresses understanding and agrees to plan.  Final Clinical Impressions(s) / UC Diagnoses   Final diagnoses:  Sore throat  Cough   ED Prescriptions    Medication Sig Dispense Auth. Provider   fluticasone (FLONASE) 50 MCG/ACT nasal spray Place 2 sprays into both nostrils daily. 1 g Naylin Burkle V, PA-C   benzonatate (TESSALON) 100 MG capsule Take 1 capsule (100 mg total) by mouth every 8 (eight) hours. 21 capsule Ok Edwards, PA-C     PDMP not reviewed this encounter.   Ok Edwards, PA-C 08/16/19 1231

## 2019-08-19 LAB — NOVEL CORONAVIRUS, NAA: SARS-CoV-2, NAA: NOT DETECTED

## 2019-10-29 ENCOUNTER — Ambulatory Visit
Admission: EM | Admit: 2019-10-29 | Discharge: 2019-10-29 | Disposition: A | Discharge status: Home / Self Care | Payer: Self-pay | Attending: Physician Assistant | Admitting: Physician Assistant

## 2019-10-29 ENCOUNTER — Other Ambulatory Visit: Payer: Self-pay

## 2019-10-29 ENCOUNTER — Emergency Department (HOSPITAL_COMMUNITY)
Admission: EM | Admit: 2019-10-29 | Discharge: 2019-10-29 | Disposition: A | Discharge status: Home / Self Care | Payer: Self-pay

## 2019-10-29 DIAGNOSIS — R1084 Generalized abdominal pain: Secondary | ICD-10-CM

## 2019-10-29 DIAGNOSIS — R197 Diarrhea, unspecified: Secondary | ICD-10-CM

## 2019-10-29 DIAGNOSIS — I10 Essential (primary) hypertension: Secondary | ICD-10-CM

## 2019-10-29 DIAGNOSIS — R110 Nausea: Secondary | ICD-10-CM

## 2019-10-29 DIAGNOSIS — R109 Unspecified abdominal pain: Secondary | ICD-10-CM

## 2019-10-29 LAB — POCT URINALYSIS DIP (MANUAL ENTRY)
Bilirubin, UA: NEGATIVE
Blood, UA: NEGATIVE
Glucose, UA: NEGATIVE mg/dL
Ketones, POC UA: NEGATIVE mg/dL
Nitrite, UA: NEGATIVE
Protein Ur, POC: 100 mg/dL — AB
Spec Grav, UA: 1.02 (ref 1.010–1.025)
Urobilinogen, UA: 0.2 E.U./dL
pH, UA: 7 (ref 5.0–8.0)

## 2019-10-29 MED ORDER — ONDANSETRON 4 MG PO TBDP: 4.0000 mg | ORAL_TABLET | Freq: Three times a day (TID) | ORAL | 0 refills | Status: DC | PRN

## 2019-10-29 MED ORDER — DICYCLOMINE HCL 20 MG PO TABS: 20.0000 mg | ORAL_TABLET | Freq: Two times a day (BID) | ORAL | 0 refills | Status: DC

## 2019-10-29 NOTE — ED Triage Notes (Signed)
Pt c/o abdominal distention with pain starting from rt flank to around center of whole abdominal area since yesterday with loose stools and lots of gas. States nausea at time.

## 2019-10-29 NOTE — ED Provider Notes (Signed)
[name]    CSN: [id number] Arrival date & time: 10/29/19  1015      History   Chief Complaint No chief complaint on file.   HPI Brianna Gutierrez is a 43 y.o. female.   43 year old female comes in for 2-day history of abdominal pain, right flank pain.  States has felt her abdomen more distended, with bloating.  Nausea without vomiting that is intermittent.  Denies fever, chills, body aches.  Has had urinary frequency without dysuria, hematuria.  Mild vaginal discharge without itching or spotting.  She is able to tolerate oral intake without difficulty.  Has had multiple diarrheal episodes.  Denies URI symptoms such as cough, congestion, sore throat.  Denies shortness of breath, loss of taste or smell.  LMP 10/18/2019.  Status post tubal ligation, cholecystectomy, C-section.     Past Medical History:  Diagnosis Date  . Borderline diabetes   . Hypertension     Patient Active Problem List   Diagnosis Date Noted  . Cholelithiasis 01/13/2019  . Pre-diabetes 11/15/2016  . Uterine fibroid 04/27/2016  . Abnormal uterine bleeding (AUB) 09/28/2015  . History of bilateral tubal ligation 09/28/2015  . Tobacco abuse 09/28/2015    Past Surgical History:  Procedure Laterality Date  . CESAREAN SECTION    . CHOLECYSTECTOMY N/A 01/14/2019   Procedure: LAPAROSCOPIC CHOLECYSTECTOMY WITH POSSIBLE  INTRAOPERATIVE CHOLANGIOGRAM;  Surgeon: Armandina Gemma, MD;  Location: [facility];  Service: General;  Laterality: N/A;  . DILITATION & CURRETTAGE/HYSTROSCOPY WITH HYDROTHERMAL ABLATION N/A 06/22/2016   Procedure: DILATATION & CURETTAGE/HYSTEROSCOPY WITH HYDROTHERMAL ABLATION;  Surgeon: Shelly Bombard, MD;  Location: [facility];  Service: Gynecology;  Laterality: N/A;  . TUBAL LIGATION      OB History    Gravida  4   Para      Term      Preterm      AB      Living  4     SAB      TAB      Ectopic      Multiple      Live Births  4            Home Medications     Prior to Admission medications   Medication Sig Start Date End Date Taking? Authorizing Provider  lisinopril-hydrochlorothiazide (ZESTORETIC) 20-12.5 MG tablet Take 1 tablet by mouth daily. 12/13/18  Yes Raylene Everts, MD  dicyclomine (BENTYL) 20 MG tablet Take 1 tablet (20 mg total) by mouth 2 (two) times daily. 10/29/19   Tasia Catchings, Amy V, PA-C  ondansetron (ZOFRAN ODT) 4 MG disintegrating tablet Take 1 tablet (4 mg total) by mouth every 8 (eight) hours as needed for nausea or vomiting. 10/29/19   Tasia Catchings, Amy V, PA-C  omeprazole (PRILOSEC) 20 MG capsule Take 1 capsule (20 mg total) by mouth daily. 01/01/19 08/07/19  Scot Jun, FNP    Family History Family History  Problem Relation Age of Onset  . Diabetes Other   . Hypertension Other     Social History Social History   Tobacco Use  . Smoking status: Current Every Day Smoker    Packs/day: 0.10    Types: Cigarettes    Last attempt to quit: 07/15/2014    Years since quitting: 5.2  . Smokeless tobacco: Never Used  Substance Use Topics  . Alcohol use: Not Currently    Comment: socially  . Drug use: No     Allergies   Percocet [oxycodone-acetaminophen] and Tramadol  Review of Systems Review of Systems  Reason unable to perform ROS: See HPI as above.     Physical Exam Triage Vital Signs ED Triage Vitals  Enc Vitals Group     BP 10/29/19 1040 (!) 149/101     Pulse Rate 10/29/19 1040 91     Resp 10/29/19 1040 16     Temp 10/29/19 1040 98.2 F (36.8 C)     Temp Source 10/29/19 1040 Oral     SpO2 10/29/19 1040 98 %     Weight --      Height --      Head Circumference --      Peak Flow --      Pain Score 10/29/19 1045 7     Pain Loc --      Pain Edu? --      Excl. in [place]? --    No data found.  Updated Vital Signs BP (!) 149/101 (BP Location: Left Arm) Comment: PT sts she used to be on BP meds, is no longer on them  Pulse 91   Temp 98.2 F (36.8 C) (Oral)   Resp 16   LMP 09/29/2019   SpO2 98%   Visual  Acuity Right Eye Distance:   Left Eye Distance:   Bilateral Distance:    Right Eye Near:   Left Eye Near:    Bilateral Near:     Physical Exam Constitutional:      General: She is not in acute distress.    Appearance: She is well-developed. She is not ill-appearing, toxic-appearing or diaphoretic.  HENT:     Head: Normocephalic and atraumatic.  Eyes:     Conjunctiva/sclera: Conjunctivae normal.     Pupils: Pupils are equal, round, and reactive to light.  Cardiovascular:     Rate and Rhythm: Normal rate and regular rhythm.     Heart sounds: Normal heart sounds. No murmur. No friction rub. No gallop.   Pulmonary:     Effort: Pulmonary effort is normal.     Breath sounds: Normal breath sounds. No wheezing or rales.  Abdominal:     General: Bowel sounds are normal.     Palpations: Abdomen is soft.     Tenderness: There is generalized abdominal tenderness. There is no right CVA tenderness, left CVA tenderness, guarding or rebound.  Skin:    General: Skin is warm and dry.  Neurological:     Mental Status: She is alert and oriented to person, place, and time.  Psychiatric:        Behavior: Behavior normal.        Judgment: Judgment normal.      UC Treatments / Results  Labs (all labs ordered are listed, but only abnormal results are displayed) Labs Reviewed  POCT URINALYSIS DIP (MANUAL ENTRY) - Abnormal; Notable for the following components:      Result Value   Protein Ur, POC =100 (*)    Leukocytes, UA Small (1+) (*)    All other components within normal limits  URINE CULTURE    EKG   Radiology No results found.  Procedures Procedures (including critical care time)  Medications Ordered in UC Medications - No data to display  Initial Impression / Assessment and Plan / UC Course  I have reviewed the triage vital signs and the nursing notes.  Pertinent labs & imaging results that were available during my care of the patient were reviewed by me and considered  in my medical decision making (see chart  for details).    Discussed with patient no alarming signs on exam.  Urine with small leukocytes.  However, given current nausea, diarrhea, will defer antibiotics until urine culture.  Bentyl for symptomatic relief.  Zofran for nausea. Push fluids. Bland diet, advance as tolerated. Return precautions given.  Final Clinical Impressions(s) / UC Diagnoses   Final diagnoses:  Generalized abdominal pain  Right flank pain  Nausea without vomiting  Diarrhea, unspecified type    ED Prescriptions    Medication Sig Dispense Auth. Provider   dicyclomine (BENTYL) 20 MG tablet Take 1 tablet (20 mg total) by mouth 2 (two) times daily. 20 tablet Yu, Amy V, PA-C   ondansetron (ZOFRAN ODT) 4 MG disintegrating tablet Take 1 tablet (4 mg total) by mouth every 8 (eight) hours as needed for nausea or vomiting. 20 tablet Ok Edwards, PA-C     PDMP not reviewed this encounter.   Ok Edwards, PA-C 10/29/19 2007

## 2019-10-29 NOTE — Discharge Instructions (Signed)
Urine showed little bacteria, we will wait for urine culture prior to treatment. Zofran for nausea and vomiting as needed. Bentyl for abdominal cramping. Keep hydrated, you urine should be clear to pale yellow in color. Bland diet, advance as tolerated. Monitor for any worsening of symptoms, nausea or vomiting not controlled by medication, worsening abdominal pain, fever, go to the emergency department for further evaluation needed.

## 2019-10-30 LAB — URINE CULTURE

## 2019-12-23 ENCOUNTER — Ambulatory Visit: Payer: HRSA Program | Attending: Internal Medicine

## 2019-12-23 ENCOUNTER — Other Ambulatory Visit: Payer: Self-pay

## 2019-12-23 ENCOUNTER — Ambulatory Visit
Admission: EM | Admit: 2019-12-23 | Discharge: 2019-12-23 | Disposition: A | Discharge status: Home / Self Care | Payer: Self-pay

## 2019-12-23 DIAGNOSIS — Z20822 Contact with and (suspected) exposure to covid-19: Secondary | ICD-10-CM | POA: Insufficient documentation

## 2019-12-24 LAB — NOVEL CORONAVIRUS, NAA: SARS-CoV-2, NAA: NOT DETECTED

## 2020-07-22 ENCOUNTER — Other Ambulatory Visit: Payer: Self-pay

## 2020-07-22 ENCOUNTER — Ambulatory Visit
Admission: EM | Admit: 2020-07-22 | Discharge: 2020-07-22 | Disposition: A | Discharge status: Home / Self Care | Payer: Self-pay | Attending: Emergency Medicine | Admitting: Emergency Medicine

## 2020-07-22 DIAGNOSIS — J069 Acute upper respiratory infection, unspecified: Secondary | ICD-10-CM

## 2020-07-22 DIAGNOSIS — Z1152 Encounter for screening for COVID-19: Secondary | ICD-10-CM

## 2020-07-22 MED ORDER — FLUTICASONE PROPIONATE 50 MCG/ACT NA SUSP: 1.0000 | Freq: Every day | NASAL | 0 refills | Status: DC

## 2020-07-22 MED ORDER — CETIRIZINE HCL 10 MG PO TABS: 10.0000 mg | ORAL_TABLET | Freq: Every day | ORAL | 0 refills | Status: DC

## 2020-07-22 NOTE — ED Provider Notes (Signed)
[name]    CSN: [id number] Arrival date & time: 07/22/20  1317      History   Chief Complaint Chief Complaint  Patient presents with  . Nasal Congestion    x 2 days  . Sore Throat    x 2 days  . Generalized Body Aches    x 2 days    HPI Brianna Gutierrez is a 44 y.o. female  Presenting for Covid testing.  Patient provides history: Endorsing rhinorrhea, generalized myalgias x2 days.  Patient did have a scratchy throat, though has improved since yesterday.  Does have mild cough that is nonproductive.  No fever, chest pain, palpitations, difficulty breathing, vomiting or diarrhea.  No known sick contacts.  Past Medical History:  Diagnosis Date  . Borderline diabetes   . Hypertension     Patient Active Problem List   Diagnosis Date Noted  . Cholelithiasis 01/13/2019  . Pre-diabetes 11/15/2016  . Uterine fibroid 04/27/2016  . Abnormal uterine bleeding (AUB) 09/28/2015  . History of bilateral tubal ligation 09/28/2015  . Tobacco abuse 09/28/2015    Past Surgical History:  Procedure Laterality Date  . CESAREAN SECTION    . CHOLECYSTECTOMY N/A 01/14/2019   Procedure: LAPAROSCOPIC CHOLECYSTECTOMY WITH POSSIBLE  INTRAOPERATIVE CHOLANGIOGRAM;  Surgeon: Armandina Gemma, MD;  Location: [facility];  Service: General;  Laterality: N/A;  . DILITATION & CURRETTAGE/HYSTROSCOPY WITH HYDROTHERMAL ABLATION N/A 06/22/2016   Procedure: DILATATION & CURETTAGE/HYSTEROSCOPY WITH HYDROTHERMAL ABLATION;  Surgeon: Shelly Bombard, MD;  Location: [facility];  Service: Gynecology;  Laterality: N/A;  . TUBAL LIGATION      OB History    Gravida  4   Para      Term      Preterm      AB      Living  4     SAB      TAB      Ectopic      Multiple      Live Births  4            Home Medications    Prior to Admission medications   Medication Sig Start Date End Date Taking? Authorizing Provider  cetirizine (ZYRTEC ALLERGY) 10 MG tablet Take 1 tablet (10 mg total) by  mouth daily. 07/22/20   Hall-Potvin, [place], PA-C  dicyclomine (BENTYL) 20 MG tablet Take 1 tablet (20 mg total) by mouth 2 (two) times daily. 10/29/19   Tasia Catchings, Amy V, PA-C  fluticasone (FLONASE) 50 MCG/ACT nasal spray Place 1 spray into both nostrils daily. 07/22/20   Hall-Potvin, [place], PA-C  lisinopril-hydrochlorothiazide (ZESTORETIC) 20-12.5 MG tablet Take 1 tablet by mouth daily. 12/13/18   Raylene Everts, MD  ondansetron (ZOFRAN ODT) 4 MG disintegrating tablet Take 1 tablet (4 mg total) by mouth every 8 (eight) hours as needed for nausea or vomiting. 10/29/19   Tasia Catchings, Amy V, PA-C  omeprazole (PRILOSEC) 20 MG capsule Take 1 capsule (20 mg total) by mouth daily. 01/01/19 08/07/19  Scot Jun, FNP    Family History Family History  Problem Relation Age of Onset  . Diabetes Other   . Hypertension Other     Social History Social History   Tobacco Use  . Smoking status: Current Every Day Smoker    Packs/day: 0.10    Types: Cigarettes    Last attempt to quit: 07/15/2014    Years since quitting: 6.0  . Smokeless tobacco: Never Used  Vaping Use  . Vaping Use: Never used  Substance Use Topics  . Alcohol use: Not Currently    Comment: socially  . Drug use: No     Allergies   Percocet [oxycodone-acetaminophen] and Tramadol   Review of Systems As per HPI   Physical Exam Triage Vital Signs ED Triage Vitals  Enc Vitals Group     BP      Pulse      Resp      Temp      Temp src      SpO2      Weight      Height      Head Circumference      Peak Flow      Pain Score      Pain Loc      Pain Edu?      Excl. in [place]?    No data found.  Updated Vital Signs BP (!) 145/95 (BP Location: Left Arm)   Pulse 96   Temp 98.9 F (37.2 C) (Oral)   LMP 06/27/2020 (Approximate)   SpO2 98%   Visual Acuity Right Eye Distance:   Left Eye Distance:   Bilateral Distance:    Right Eye Near:   Left Eye Near:    Bilateral Near:     Physical Exam Constitutional:       General: She is not in acute distress.    Appearance: She is obese. She is not ill-appearing or diaphoretic.  HENT:     Head: Normocephalic and atraumatic.     Mouth/Throat:     Mouth: Mucous membranes are moist.     Pharynx: Oropharynx is clear. No oropharyngeal exudate or posterior oropharyngeal erythema.  Eyes:     General: No scleral icterus.    Conjunctiva/sclera: Conjunctivae normal.     Pupils: Pupils are equal, round, and reactive to light.  Neck:     Comments: Trachea midline, negative JVD Cardiovascular:     Rate and Rhythm: Normal rate and regular rhythm.     Heart sounds: No murmur heard.  No gallop.   Pulmonary:     Effort: Pulmonary effort is normal. No respiratory distress.     Breath sounds: No wheezing, rhonchi or rales.  Musculoskeletal:     Cervical back: Neck supple. No tenderness.  Lymphadenopathy:     Cervical: No cervical adenopathy.  Skin:    Capillary Refill: Capillary refill takes less than 2 seconds.     Coloration: Skin is not jaundiced or pale.     Findings: No rash.  Neurological:     General: No focal deficit present.     Mental Status: She is alert and oriented to person, place, and time.      UC Treatments / Results  Labs (all labs ordered are listed, but only abnormal results are displayed) Labs Reviewed  NOVEL CORONAVIRUS, NAA    EKG   Radiology No results found.  Procedures Procedures (including critical care time)  Medications Ordered in UC Medications - No data to display  Initial Impression / Assessment and Plan / UC Course  I have reviewed the triage vital signs and the nursing notes.  Pertinent labs & imaging results that were available during my care of the patient were reviewed by me and considered in my medical decision making (see chart for details).     Patient afebrile, nontoxic, with SpO2 98%.  Covid PCR pending.  Patient to quarantine until results are back.  We will treat supportively as outlined below.   Return precautions discussed,  patient verbalized understanding and is agreeable to plan. Final Clinical Impressions(s) / UC Diagnoses   Final diagnoses:  Encounter for screening for COVID-19  URI with cough and congestion     Discharge Instructions     Your COVID test is pending - it is important to quarantine / isolate at home until your results are back. If you test positive and would like further evaluation for persistent or worsening symptoms, you may schedule an E-visit or virtual (video) visit throughout the [facility] app or website.  PLEASE NOTE: If you develop severe chest pain or shortness of breath please go to the ER or call 9-1-1 for further evaluation --> DO NOT schedule electronic or virtual visits for this. Please call our office for further guidance / recommendations as needed.  For information about the Covid vaccine, please visit [email or web address]    ED Prescriptions    Medication Sig Dispense Auth. Provider   cetirizine (ZYRTEC ALLERGY) 10 MG tablet Take 1 tablet (10 mg total) by mouth daily. 30 tablet Hall-Potvin, [place], PA-C   fluticasone (FLONASE) 50 MCG/ACT nasal spray Place 1 spray into both nostrils daily. 16 g Hall-Potvin, [place], PA-C     PDMP not reviewed this encounter.   Hall-Potvin, [place], [place] 07/22/20 1459

## 2020-07-22 NOTE — ED Triage Notes (Signed)
Pt states she has had a runny nose and generalized body aches x 2 days. Pt states she has also had a sore throat but it has improved since yesterday. Pt is aox4 and ambulatory.

## 2020-07-22 NOTE — Discharge Instructions (Addendum)
Your COVID test is pending - it is important to quarantine / isolate at home until your results are back. °If you test positive and would like further evaluation for persistent or worsening symptoms, you may schedule an E-visit or virtual (video) visit throughout the [facility] MyChart app or website. ° °PLEASE NOTE: If you develop severe chest pain or shortness of breath please go to the ER or call 9-1-1 for further evaluation --> DO NOT schedule electronic or virtual visits for this. °Please call our office for further guidance / recommendations as needed. ° °For information about the Covid vaccine, please visit [email or web address] °

## 2020-07-23 LAB — NOVEL CORONAVIRUS, NAA: SARS-CoV-2, NAA: NOT DETECTED

## 2020-07-23 LAB — SARS-COV-2, NAA 2 DAY TAT

## 2020-08-24 ENCOUNTER — Other Ambulatory Visit: Payer: Self-pay

## 2020-08-24 ENCOUNTER — Ambulatory Visit
Admission: EM | Admit: 2020-08-24 | Discharge: 2020-08-24 | Disposition: A | Discharge status: Home / Self Care | Payer: Self-pay | Attending: Emergency Medicine | Admitting: Emergency Medicine

## 2020-08-24 DIAGNOSIS — L299 Pruritus, unspecified: Secondary | ICD-10-CM

## 2020-08-24 MED ORDER — HYDROXYZINE HCL 25 MG PO TABS: 25.0000 mg | ORAL_TABLET | Freq: Four times a day (QID) | ORAL | 0 refills | Status: DC

## 2020-08-24 MED ORDER — PREDNISONE 10 MG (21) PO TBPK: ORAL_TABLET | Freq: Every day | ORAL | 0 refills | Status: DC

## 2020-08-24 MED ORDER — TRIAMCINOLONE ACETONIDE 0.5 % EX OINT: 1.0000 "application " | TOPICAL_OINTMENT | Freq: Two times a day (BID) | CUTANEOUS | 0 refills | Status: DC

## 2020-08-24 NOTE — ED Triage Notes (Signed)
Pt states she woke up this morning with swelling in her right had, right upper arm, and her left elbow. Pt states it has gotten worse throughout the day. Pt is aox4 and ambulatory.

## 2020-08-24 NOTE — Discharge Instructions (Signed)
Prednisone with breakfast: 6-5-4-3-2-1 Keep skin clean and dry. May apply triamcinolone twice daily x1 week. Use Hydroxyzine before bedtime. Avoid hot water as this can further dry out and irritate skin. Important to wash all clothes, bedding, blankets in hot water. Return for worsening rash, pain, swelling, redness, fever.

## 2020-08-24 NOTE — ED Provider Notes (Signed)
[name]    CSN: [id number] Arrival date & time: 08/24/20  1918      History   Chief Complaint Chief Complaint  Patient presents with  . Arm Pain    right upper and hand    HPI Brianna Gutierrez is a 44 y.o. female  Presenting for rash with pain and swelling to right hand.  States that it began a few days ago, has since spread up her arm.  Thinks that she was bit by a bug, though unsure what kind.  Denies fever, arthralgias, myalgias, headache or fatigue.  Endorsing pruritus.  Past Medical History:  Diagnosis Date  . Borderline diabetes   . Hypertension     Patient Active Problem List   Diagnosis Date Noted  . Cholelithiasis 01/13/2019  . Pre-diabetes 11/15/2016  . Uterine fibroid 04/27/2016  . Abnormal uterine bleeding (AUB) 09/28/2015  . History of bilateral tubal ligation 09/28/2015  . Tobacco abuse 09/28/2015    Past Surgical History:  Procedure Laterality Date  . CESAREAN SECTION    . CHOLECYSTECTOMY N/A 01/14/2019   Procedure: LAPAROSCOPIC CHOLECYSTECTOMY WITH POSSIBLE  INTRAOPERATIVE CHOLANGIOGRAM;  Surgeon: Armandina Gemma, MD;  Location: [facility];  Service: General;  Laterality: N/A;  . DILITATION & CURRETTAGE/HYSTROSCOPY WITH HYDROTHERMAL ABLATION N/A 06/22/2016   Procedure: DILATATION & CURETTAGE/HYSTEROSCOPY WITH HYDROTHERMAL ABLATION;  Surgeon: Shelly Bombard, MD;  Location: [facility];  Service: Gynecology;  Laterality: N/A;  . TUBAL LIGATION      OB History    Gravida  4   Para      Term      Preterm      AB      Living  4     SAB      TAB      Ectopic      Multiple      Live Births  4            Home Medications    Prior to Admission medications   Medication Sig Start Date End Date Taking? Authorizing Provider  cetirizine (ZYRTEC ALLERGY) 10 MG tablet Take 1 tablet (10 mg total) by mouth daily. 07/22/20  Yes Hall-Potvin, [place], PA-C  fluticasone (FLONASE) 50 MCG/ACT nasal spray Place 1 spray into both nostrils  daily. 07/22/20  Yes Hall-Potvin, [place], PA-C  hydrOXYzine (ATARAX/VISTARIL) 25 MG tablet Take 1 tablet (25 mg total) by mouth every 6 (six) hours. 08/24/20   Hall-Potvin, [place], PA-C  lisinopril-hydrochlorothiazide (ZESTORETIC) 20-12.5 MG tablet Take 1 tablet by mouth daily. 12/13/18   Raylene Everts, MD  predniSONE (STERAPRED UNI-PAK 21 TAB) 10 MG (21) TBPK tablet Take by mouth daily. Take steroid taper as written 08/24/20   Hall-Potvin, [place], PA-C  triamcinolone ointment (KENALOG) 0.5 % Apply 1 application topically 2 (two) times daily. 08/24/20   Hall-Potvin, [place], PA-C  dicyclomine (BENTYL) 20 MG tablet Take 1 tablet (20 mg total) by mouth 2 (two) times daily. 10/29/19 08/24/20  Ok Edwards, PA-C  omeprazole (PRILOSEC) 20 MG capsule Take 1 capsule (20 mg total) by mouth daily. 01/01/19 08/07/19  Scot Jun, FNP    Family History Family History  Problem Relation Age of Onset  . Diabetes Other   . Hypertension Other     Social History Social History   Tobacco Use  . Smoking status: Current Every Day Smoker    Packs/day: 0.10    Types: Cigarettes    Last attempt to quit: 07/15/2014    Years since quitting: 6.1  .  Smokeless tobacco: Never Used  Vaping Use  . Vaping Use: Never used  Substance Use Topics  . Alcohol use: Not Currently    Comment: socially  . Drug use: No     Allergies   Percocet [oxycodone-acetaminophen] and Tramadol   Review of Systems Review of Systems  Constitutional: Negative for fatigue and fever.  HENT: Negative for ear pain, sinus pain, sore throat and voice change.   Eyes: Negative for pain, redness and visual disturbance.  Respiratory: Negative for cough and shortness of breath.   Cardiovascular: Negative for chest pain and palpitations.  Gastrointestinal: Negative for abdominal pain, diarrhea and vomiting.  Musculoskeletal: Negative for arthralgias and myalgias.  Skin: Positive for rash. Negative for wound.  Neurological:  Negative for syncope and headaches.  All other systems reviewed and are negative.    Physical Exam Triage Vital Signs ED Triage Vitals  Enc Vitals Group     BP 08/24/20 1925 (!) 154/90     Pulse Rate 08/24/20 1925 82     Resp 08/24/20 1925 18     Temp 08/24/20 1925 98 F (36.7 C)     Temp Source 08/24/20 1925 Oral     SpO2 08/24/20 1925 98 %     Weight --      Height --      Head Circumference --      Peak Flow --      Pain Score 08/24/20 1927 4     Pain Loc --      Pain Edu? --      Excl. in [place]? --    No data found.  Updated Vital Signs BP (!) 154/90 (BP Location: Left Arm)   Pulse 82   Temp 98 F (36.7 C) (Oral)   Resp 18   LMP 07/25/2020 (Approximate)   SpO2 98%   Visual Acuity Right Eye Distance:   Left Eye Distance:   Bilateral Distance:    Right Eye Near:   Left Eye Near:    Bilateral Near:     Physical Exam Constitutional:      General: She is not in acute distress. HENT:     Head: Normocephalic and atraumatic.  Eyes:     General: No scleral icterus.    Pupils: Pupils are equal, round, and reactive to light.  Cardiovascular:     Rate and Rhythm: Normal rate.  Pulmonary:     Effort: Pulmonary effort is normal.  Musculoskeletal:        General: Swelling and tenderness present. Normal range of motion.     Comments: Right hand mildly, diffusely swollen with scant, scattered, small vesicular lesions.  No d/c or open wounds.  NVI.  Skin:    General: Skin is warm.     Capillary Refill: Capillary refill takes less than 2 seconds.     Coloration: Skin is not jaundiced or pale.     Findings: Rash present. No bruising or erythema.  Neurological:     General: No focal deficit present.     Mental Status: She is alert and oriented to person, place, and time.      UC Treatments / Results  Labs (all labs ordered are listed, but only abnormal results are displayed) Labs Reviewed - No data to display  EKG   Radiology No results  found.  Procedures Procedures (including critical care time)  Medications Ordered in UC Medications - No data to display  Initial Impression / Assessment and Plan / UC Course  I  have reviewed the triage vital signs and the nursing notes.  Pertinent labs & imaging results that were available during my care of the patient were reviewed by me and considered in my medical decision making (see chart for details).     We will treat pruritic dermatitis symptomatically as below.  Return precautions discussed, pt verbalized understanding and is agreeable to plan. Final Clinical Impressions(s) / UC Diagnoses   Final diagnoses:  Pruritic dermatitis     Discharge Instructions     Prednisone with breakfast: 6-5-4-3-2-1 Keep skin clean and dry. May apply triamcinolone twice daily x1 week. Use Hydroxyzine before bedtime. Avoid hot water as this can further dry out and irritate skin. Important to wash all clothes, bedding, blankets in hot water. Return for worsening rash, pain, swelling, redness, fever.    ED Prescriptions    Medication Sig Dispense Auth. Provider   predniSONE (STERAPRED UNI-PAK 21 TAB) 10 MG (21) TBPK tablet Take by mouth daily. Take steroid taper as written 21 tablet Hall-Potvin, [place], PA-C   hydrOXYzine (ATARAX/VISTARIL) 25 MG tablet Take 1 tablet (25 mg total) by mouth every 6 (six) hours. 12 tablet Hall-Potvin, [place], PA-C   triamcinolone ointment (KENALOG) 0.5 % Apply 1 application topically 2 (two) times daily. 30 g Hall-Potvin, [place], PA-C     PDMP not reviewed this encounter.   Hall-Potvin, [place], [place] 08/25/20 [phone]

## 2020-09-12 ENCOUNTER — Other Ambulatory Visit: Payer: Self-pay

## 2020-09-12 ENCOUNTER — Ambulatory Visit
Admission: EM | Admit: 2020-09-12 | Discharge: 2020-09-12 | Disposition: A | Discharge status: Home / Self Care | Payer: Self-pay | Attending: Physician Assistant | Admitting: Physician Assistant

## 2020-09-12 DIAGNOSIS — M545 Low back pain, unspecified: Secondary | ICD-10-CM

## 2020-09-12 MED ORDER — MELOXICAM 7.5 MG PO TABS: 7.5000 mg | ORAL_TABLET | Freq: Every day | ORAL | 0 refills | Status: DC

## 2020-09-12 MED ORDER — TIZANIDINE HCL 2 MG PO TABS: 2.0000 mg | ORAL_TABLET | Freq: Three times a day (TID) | ORAL | 0 refills | Status: DC | PRN
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
## 2020-09-20 ENCOUNTER — Other Ambulatory Visit: Payer: Self-pay

## 2020-09-20 ENCOUNTER — Emergency Department (HOSPITAL_COMMUNITY): Payer: No Typology Code available for payment source

## 2020-09-20 ENCOUNTER — Encounter (HOSPITAL_COMMUNITY): Payer: Self-pay

## 2020-09-20 ENCOUNTER — Emergency Department (HOSPITAL_COMMUNITY)
Admission: EM | Admit: 2020-09-20 | Discharge: 2020-09-20 | Disposition: A | Discharge status: Home / Self Care | Payer: No Typology Code available for payment source | Attending: Emergency Medicine | Admitting: Emergency Medicine

## 2020-09-20 DIAGNOSIS — Y9389 Activity, other specified: Secondary | ICD-10-CM | POA: Insufficient documentation

## 2020-09-20 DIAGNOSIS — I10 Essential (primary) hypertension: Secondary | ICD-10-CM | POA: Insufficient documentation

## 2020-09-20 DIAGNOSIS — F17210 Nicotine dependence, cigarettes, uncomplicated: Secondary | ICD-10-CM | POA: Insufficient documentation

## 2020-09-20 DIAGNOSIS — Y92410 Unspecified street and highway as the place of occurrence of the external cause: Secondary | ICD-10-CM | POA: Diagnosis not present

## 2020-09-20 DIAGNOSIS — M545 Low back pain, unspecified: Secondary | ICD-10-CM | POA: Insufficient documentation

## 2020-09-20 MED ORDER — ACETAMINOPHEN 500 MG PO TABS
1000.0000 mg | ORAL_TABLET | Freq: Once | ORAL | Status: AC
  Administered 2020-09-20: 1000 mg via ORAL
  Filled 2020-09-20: qty 2

## 2020-09-20 MED ORDER — METHOCARBAMOL 500 MG PO TABS: 500.0000 mg | ORAL_TABLET | Freq: Three times a day (TID) | ORAL | 0 refills | Status: DC | PRN

## 2020-09-20 MED ORDER — METHOCARBAMOL 500 MG PO TABS
1000.0000 mg | ORAL_TABLET | Freq: Once | ORAL | Status: AC
  Administered 2020-09-20: 1000 mg via ORAL
  Filled 2020-09-20: qty 2
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
## 2020-09-20 NOTE — Discharge Instructions (Addendum)
You were evaluated in the Emergency Department and after careful evaluation, we did not find any emergent condition requiring admission or further testing in the hospital.  Your exam/testing today was overall reassuring.  X-ray was normal.  Continue using Tylenol or Motrin you can try the Robaxin muscle relaxer instead of the other one.  Please return to the Emergency Department if you experience any worsening of your condition.  Thank you for allowing [place] to be a part of your care.

## 2020-10-24 ENCOUNTER — Ambulatory Visit
Admission: EM | Admit: 2020-10-24 | Discharge: 2020-10-24 | Disposition: A | Discharge status: Home / Self Care | Payer: Self-pay | Attending: Emergency Medicine | Admitting: Emergency Medicine

## 2020-10-24 ENCOUNTER — Other Ambulatory Visit: Payer: Self-pay

## 2020-10-24 DIAGNOSIS — Z8616 Personal history of COVID-19: Secondary | ICD-10-CM

## 2020-10-24 DIAGNOSIS — R519 Headache, unspecified: Secondary | ICD-10-CM

## 2020-10-24 DIAGNOSIS — Z1152 Encounter for screening for COVID-19: Secondary | ICD-10-CM

## 2020-10-24 DIAGNOSIS — J069 Acute upper respiratory infection, unspecified: Secondary | ICD-10-CM

## 2020-10-24 DIAGNOSIS — J029 Acute pharyngitis, unspecified: Secondary | ICD-10-CM

## 2020-10-24 HISTORY — DX: Personal history of COVID-19: Z86.16

## 2020-10-24 MED ORDER — FLUTICASONE PROPIONATE 50 MCG/ACT NA SUSP: 1.0000 | Freq: Every day | NASAL | 0 refills | Status: DC

## 2020-10-24 MED ORDER — CETIRIZINE HCL 10 MG PO TABS: 10.0000 mg | ORAL_TABLET | Freq: Every day | ORAL | 0 refills | Status: DC

## 2020-10-24 MED ORDER — BENZONATATE 100 MG PO CAPS: 100.0000 mg | ORAL_CAPSULE | Freq: Three times a day (TID) | ORAL | 0 refills | Status: DC

## 2020-10-24 MED ORDER — CLOTRIMAZOLE 1 % EX CREA: TOPICAL_CREAM | CUTANEOUS | 0 refills | Status: DC

## 2020-10-24 NOTE — ED Triage Notes (Signed)
Patient states she started Generalized body aches and a mild sore throat yesterday. Pt states she has developed a cough and loss of taste this am. Pt is aox4 and ambulatory.

## 2020-10-24 NOTE — Discharge Instructions (Addendum)
Lotrimin 2 times a day

## 2020-10-24 NOTE — ED Provider Notes (Signed)
[name]    CSN: [id number] Arrival date & time: 10/24/20  0954      History   Chief Complaint Chief Complaint  Patient presents with  . Generalized Body Aches    Since yesterday  . Sore Throat    Scratchy since yesterday  . Loss of taste    This am  . Cough    This am    HPI Brianna Gutierrez is a 44 y.o. female  With history as below presenting for myalgias, sore throat, frontal headache since yesterday.  Also developed a dry cough and change in taste this morning.  Denies known sick contacts.  Has not taken anything for this.  Past Medical History:  Diagnosis Date  . Borderline diabetes   . Hypertension     Patient Active Problem List   Diagnosis Date Noted  . Cholelithiasis 01/13/2019  . Pre-diabetes 11/15/2016  . Uterine fibroid 04/27/2016  . Abnormal uterine bleeding (AUB) 09/28/2015  . History of bilateral tubal ligation 09/28/2015  . Tobacco abuse 09/28/2015    Past Surgical History:  Procedure Laterality Date  . CESAREAN SECTION    . CHOLECYSTECTOMY N/A 01/14/2019   Procedure: LAPAROSCOPIC CHOLECYSTECTOMY WITH POSSIBLE  INTRAOPERATIVE CHOLANGIOGRAM;  Surgeon: Armandina Gemma, MD;  Location: [facility];  Service: General;  Laterality: N/A;  . DILITATION & CURRETTAGE/HYSTROSCOPY WITH HYDROTHERMAL ABLATION N/A 06/22/2016   Procedure: DILATATION & CURETTAGE/HYSTEROSCOPY WITH HYDROTHERMAL ABLATION;  Surgeon: Shelly Bombard, MD;  Location: [facility];  Service: Gynecology;  Laterality: N/A;  . TUBAL LIGATION      OB History    Gravida  4   Para      Term      Preterm      AB      Living  4     SAB      IAB      Ectopic      Multiple      Live Births  4            Home Medications    Prior to Admission medications   Medication Sig Start Date End Date Taking? Authorizing Provider  benzonatate (TESSALON) 100 MG capsule Take 1 capsule (100 mg total) by mouth every 8 (eight) hours. 10/24/20  Yes Hall-Potvin, [place], PA-C   cetirizine (ZYRTEC ALLERGY) 10 MG tablet Take 1 tablet (10 mg total) by mouth daily. 10/24/20  Yes Hall-Potvin, [place], PA-C  clotrimazole (LOTRIMIN) 1 % cream Apply to affected area 2 times daily 10/24/20  Yes Hall-Potvin, [place], PA-C  fluticasone (FLONASE) 50 MCG/ACT nasal spray Place 1 spray into both nostrils daily. 10/24/20  Yes Hall-Potvin, [place], PA-C  hydrOXYzine (ATARAX/VISTARIL) 25 MG tablet Take 1 tablet (25 mg total) by mouth every 6 (six) hours. 08/24/20   Hall-Potvin, [place], PA-C  lisinopril-hydrochlorothiazide (ZESTORETIC) 20-12.5 MG tablet Take 1 tablet by mouth daily. 12/13/18   Raylene Everts, MD  meloxicam (MOBIC) 7.5 MG tablet Take 1 tablet (7.5 mg total) by mouth daily. 09/12/20   Tasia Catchings, Amy V, PA-C  methocarbamol (ROBAXIN) 500 MG tablet Take 1 tablet (500 mg total) by mouth every 8 (eight) hours as needed for muscle spasms. 09/20/20   Maudie Flakes, MD  dicyclomine (BENTYL) 20 MG tablet Take 1 tablet (20 mg total) by mouth 2 (two) times daily. 10/29/19 08/24/20  Ok Edwards, PA-C  omeprazole (PRILOSEC) 20 MG capsule Take 1 capsule (20 mg total) by mouth daily. 01/01/19 08/07/19  Scot Jun, FNP  Family History Family History  Problem Relation Age of Onset  . Diabetes Other   . Hypertension Other     Social History Social History   Tobacco Use  . Smoking status: Current Every Day Smoker    Packs/day: 0.10    Types: Cigarettes    Last attempt to quit: 07/15/2014    Years since quitting: 6.2  . Smokeless tobacco: Never Used  Vaping Use  . Vaping Use: Never used  Substance Use Topics  . Alcohol use: Not Currently    Comment: socially  . Drug use: No     Allergies   Percocet [oxycodone-acetaminophen] and Tramadol   Review of Systems Review of Systems  Constitutional: Negative for fatigue and fever.  HENT: Positive for congestion and sore throat. Negative for dental problem, ear pain, facial swelling, hearing loss, sinus pain, trouble  swallowing and voice change.   Eyes: Negative for photophobia, pain and visual disturbance.  Respiratory: Positive for cough. Negative for shortness of breath and wheezing.   Cardiovascular: Negative for chest pain and palpitations.  Gastrointestinal: Negative for diarrhea and vomiting.  Musculoskeletal: Negative for arthralgias and myalgias.  Neurological: Positive for headaches. Negative for dizziness.     Physical Exam Triage Vital Signs ED Triage Vitals  Enc Vitals Group     BP 10/24/20 1210 (!) 161/103     Pulse Rate 10/24/20 1210 84     Resp 10/24/20 1210 18     Temp 10/24/20 1210 98.2 F (36.8 C)     Temp Source 10/24/20 1210 Oral     SpO2 10/24/20 1210 98 %     Weight --      Height --      Head Circumference --      Peak Flow --      Pain Score 10/24/20 1207 6     Pain Loc --      Pain Edu? --      Excl. in [place]? --    No data found.  Updated Vital Signs BP (!) 161/103 (BP Location: Left Arm)   Pulse 84   Temp 98.2 F (36.8 C) (Oral)   Resp 18   LMP 10/20/2020 (Approximate)   SpO2 98%   Visual Acuity Right Eye Distance:   Left Eye Distance:   Bilateral Distance:    Right Eye Near:   Left Eye Near:    Bilateral Near:     Physical Exam Constitutional:      General: She is not in acute distress.    Appearance: She is not ill-appearing or diaphoretic.  HENT:     Head: Normocephalic and atraumatic.     Right Ear: Tympanic membrane and ear canal normal.     Left Ear: Tympanic membrane and ear canal normal.     Mouth/Throat:     Mouth: Mucous membranes are moist.     Pharynx: Oropharynx is clear. No oropharyngeal exudate or posterior oropharyngeal erythema.  Eyes:     General: No scleral icterus.    Conjunctiva/sclera: Conjunctivae normal.     Pupils: Pupils are equal, round, and reactive to light.  Neck:     Comments: Trachea midline, negative JVD Cardiovascular:     Rate and Rhythm: Normal rate and regular rhythm.     Heart sounds: No murmur  heard. No gallop.   Pulmonary:     Effort: Pulmonary effort is normal. No respiratory distress.     Breath sounds: No wheezing, rhonchi or rales.  Musculoskeletal:  Cervical back: Neck supple. No tenderness.  Lymphadenopathy:     Cervical: No cervical adenopathy.  Skin:    Capillary Refill: Capillary refill takes less than 2 seconds.     Coloration: Skin is not jaundiced or pale.     Findings: No rash.  Neurological:     General: No focal deficit present.     Mental Status: She is alert and oriented to person, place, and time.      UC Treatments / Results  Labs (all labs ordered are listed, but only abnormal results are displayed) Labs Reviewed  NOVEL CORONAVIRUS, NAA    EKG   Radiology No results found.  Procedures Procedures (including critical care time)  Medications Ordered in UC Medications - No data to display  Initial Impression / Assessment and Plan / UC Course  I have reviewed the triage vital signs and the nursing notes.  Pertinent labs & imaging results that were available during my care of the patient were reviewed by me and considered in my medical decision making (see chart for details).     Patient afebrile, nontoxic, with SpO2 98%.  Covid PCR pending.  Patient to quarantine until results are back.  We will treat supportively as outlined below.  Return precautions discussed, patient verbalized understanding and is agreeable to plan. Final Clinical Impressions(s) / UC Diagnoses   Final diagnoses:  Encounter for screening for COVID-19  URI with cough and congestion  Sore throat  Frontal headache     Discharge Instructions     Lotrimin 2 times a day    ED Prescriptions    Medication Sig Dispense Auth. Provider   cetirizine (ZYRTEC ALLERGY) 10 MG tablet Take 1 tablet (10 mg total) by mouth daily. 30 tablet Hall-Potvin, [place], PA-C   fluticasone (FLONASE) 50 MCG/ACT nasal spray Place 1 spray into both nostrils daily. 16 g Hall-Potvin,  [place], PA-C   benzonatate (TESSALON) 100 MG capsule Take 1 capsule (100 mg total) by mouth every 8 (eight) hours. 21 capsule Hall-Potvin, [place], PA-C   clotrimazole (LOTRIMIN) 1 % cream Apply to affected area 2 times daily 15 g Hall-Potvin, [place], PA-C     PDMP not reviewed this encounter.   Hall-Potvin, [place], [place] 10/24/20 1254

## 2020-10-25 LAB — NOVEL CORONAVIRUS, NAA: SARS-CoV-2, NAA: DETECTED — AB

## 2020-10-25 LAB — SARS-COV-2, NAA 2 DAY TAT

## 2020-10-26 ENCOUNTER — Telehealth: Payer: Self-pay | Admitting: Infectious Diseases

## 2020-10-26 NOTE — Telephone Encounter (Signed)
Called to discuss with patient about Covid symptoms and the use of a monoclonal antibody infusion for those with mild to moderate Covid symptoms and at a high risk of hospitalization.   She would like to accept and review information but not interested at this time.   Sx started 12/26 Not vaccinated Qualified for infusion with SVI score 4, HTN and overweight.   Rexene Alberts, NP  10/26/2020 2:35 PM

## 2020-10-29 DIAGNOSIS — I2699 Other pulmonary embolism without acute cor pulmonale: Secondary | ICD-10-CM

## 2020-10-29 HISTORY — DX: Other pulmonary embolism without acute cor pulmonale: I26.99

## 2020-11-01 ENCOUNTER — Other Ambulatory Visit: Payer: Self-pay

## 2020-11-03 ENCOUNTER — Other Ambulatory Visit: Payer: Self-pay

## 2020-11-21 NOTE — Progress Notes (Addendum)
Subjective:    Brianna Gutierrez - 45 y.o. female MRN [id number]  Date of birth: 1975-11-05  HPI  Brianna Gutierrez is to establish care. Patient has a PMH significant for cholelithiasis, abnormal uterine bleeding, uterine fibroid, history of bilateral tubal ligation, tobacco abuse, and pre-diabetes.   Current issues and/or concerns: 1. URINARY SYMPTOMS: Duration: 3 days  Dysuria: yes   Color: dark with foam Urinary frequency: yes Urgency: yes Foul odor: yes Hematuria: no Abdominal pain: no Back pain: no Suprapubic pain/pressure: no Flank pain: no Fever:  no Vomiting: no  2. HYPERTENSION: Reports has taken Lisinopril-Hydrochlorothiazide in the past Adherence with salt restriction: [x]  Yes    []  No Exercise: trying  Home Monitoring?: []  Yes    [x]  No Smoking [x]  Yes []  No 5 cigarettes daily  SOB? []  Yes    [x]  No Chest Pain?: []  Yes    [x]  No Leg swelling?: []  Yes    [x]  No Headaches?: [x]  Yes, Excedrin which helps Dizziness? []  Yes    [x]  No  ROS per HPI   Health Maintenance:  Health Maintenance Due  Topic Date Due  . COVID-19 Vaccine (1) Never done  . PAP SMEAR-Modifier  11/14/2019    Past Medical History: Patient Active Problem List   Diagnosis Date Noted  . Cholelithiasis 01/13/2019  . Pre-diabetes 11/15/2016  . Uterine fibroid 04/27/2016  . Abnormal uterine bleeding (AUB) 09/28/2015  . History of bilateral tubal ligation 09/28/2015  . Tobacco abuse 09/28/2015      Social History   reports that she has been smoking cigarettes. She has been smoking about 0.10 packs per day. She has never used smokeless tobacco. She reports previous alcohol use. She reports that she does not use drugs.   Family History  family history includes Diabetes in an other family member; Hypertension in an other family member.   Medications: reviewed and updated   Objective:   Physical Exam BP (!) 147/103 (BP Location: Left Arm, Patient Position: Sitting)   Pulse 72   Ht 5' 2.13"  (1.578 m)   Wt 184 lb (83.5 kg)   SpO2 93%   BMI 33.52 kg/m  Physical Exam HENT:     Head: Normocephalic.  Eyes:     Extraocular Movements: Extraocular movements intact.     Pupils: Pupils are equal, round, and reactive to light.  Cardiovascular:     Rate and Rhythm: Normal rate and regular rhythm.     Pulses: Normal pulses.     Heart sounds: Normal heart sounds.  Pulmonary:     Effort: Pulmonary effort is normal.     Breath sounds: Normal breath sounds.  Musculoskeletal:     Cervical back: Normal range of motion and neck supple.  Neurological:     General: No focal deficit present.     Mental Status: She is alert and oriented to person, place, and time.  Psychiatric:        Mood and Affect: Mood normal.        Behavior: Behavior normal.     Results for orders placed or performed in visit on 98/92/11  Basic Metabolic Panel  Result Value Ref Range   Glucose 83 65 - 99 mg/dL   BUN 11 6 - 24 mg/dL   Creatinine, Ser 0.78 0.57 - 1.00 mg/dL   GFR calc non Af Amer 93 >59 mL/min/1.73   GFR calc Af Amer 107 >59 mL/min/1.73   BUN/Creatinine Ratio 14 9 - 23   Sodium 136 134 -  144 mmol/L   Potassium 3.9 3.5 - 5.2 mmol/L   Chloride 104 96 - 106 mmol/L   CO2 20 20 - 29 mmol/L   Calcium 9.1 8.7 - 10.2 mg/dL  POCT URINALYSIS DIP (CLINITEK)  Result Value Ref Range   Color, UA yellow yellow   Clarity, UA cloudy (A) clear   Glucose, UA negative negative mg/dL   Bilirubin, UA negative negative   Ketones, POC UA negative negative mg/dL   Spec Grav, UA >=1.030 (A) 1.010 - 1.025   Blood, UA trace-intact (A) negative   pH, UA 5.5 5.0 - 8.0   POC PROTEIN,UA >=300 (A) negative, trace   Urobilinogen, UA 0.2 0.2 or 1.0 E.U./dL   Nitrite, UA Negative Negative   Leukocytes, UA Negative Negative     Assessment & Plan:  1. Essential hypertension: - Previously taking Lisinopril-Hydrochlorothiazide. - Blood pressure not at goal during today's visit. Patient asymptomatic without chest  pressure, chest pain, palpitations, shortness of breath, and worst headache of life. - Begin low-dose Lisinopril-Hydrochlorothiazide as prescribed. - Counseled on blood pressure goal of less than 130/80, low-sodium, DASH diet, medication compliance, 150 minutes of moderate intensity exercise per week as tolerated. Discussed medication compliance, adverse effects. - BMP to check kidney function and electrolyte balance.  - Follow-up with in 2 to 4 weeks for blood pressure check.  - Basic Metabolic Panel - lisinopril-hydrochlorothiazide (ZESTORETIC) 10-12.5 MG tablet; Take 1 tablet by mouth daily.  Dispense: 45 tablet; Refill: 0  2. Frequent urination: - Today urine negative for urinary tract infection.  - BMP to check kidney function and electrolyte balance.  - Follow-up with primary provider as needed.  - POCT URINALYSIS DIP (CLINITEK) - Basic Metabolic Panel  3. Need for immunization against influenza: - Flu vaccine administered today in clinic. - Flu Vaccine QUAD 36+ mos IM  Durene Fruits, NP 11/23/2020, 8:48 AM Primary Care at [facility]

## 2020-11-22 ENCOUNTER — Encounter: Payer: Self-pay | Admitting: Family

## 2020-11-22 ENCOUNTER — Ambulatory Visit (INDEPENDENT_AMBULATORY_CARE_PROVIDER_SITE_OTHER): Payer: BC Managed Care – PPO | Admitting: Family

## 2020-11-22 ENCOUNTER — Other Ambulatory Visit: Payer: Self-pay

## 2020-11-22 VITALS — BP 147/103 | HR 72 | Ht 62.13 in | Wt 184.0 lb

## 2020-11-22 DIAGNOSIS — Z23 Encounter for immunization: Secondary | ICD-10-CM

## 2020-11-22 DIAGNOSIS — R350 Frequency of micturition: Secondary | ICD-10-CM | POA: Diagnosis not present

## 2020-11-22 DIAGNOSIS — I10 Essential (primary) hypertension: Secondary | ICD-10-CM | POA: Diagnosis not present

## 2020-11-22 LAB — POCT URINALYSIS DIP (CLINITEK)
Bilirubin, UA: NEGATIVE
Glucose, UA: NEGATIVE mg/dL
Ketones, POC UA: NEGATIVE mg/dL
Leukocytes, UA: NEGATIVE
Nitrite, UA: NEGATIVE
POC PROTEIN,UA: >=300 — AB
Spec Grav, UA: >=1.03 — AB (ref 1.010–1.025)
Urobilinogen, UA: 0.2 E.U./dL
pH, UA: 5.5 (ref 5.0–8.0)

## 2020-11-22 MED ORDER — LISINOPRIL-HYDROCHLOROTHIAZIDE 10-12.5 MG PO TABS: 1.0000 | ORAL_TABLET | Freq: Every day | ORAL | 0 refills | Status: DC

## 2020-11-22 NOTE — Patient Instructions (Addendum)
Return in 4 to 6 weeks or sooner if needed for annual physical examination, labs, and health maintenance. Arrive fasting meaning having had no food and/or nothing to drink for at least 8 hours prior to appointment.  Lisinopril-Hydrochlorothiazide for high blood pressure. Follow-up with in 2 weeks for blood pressure check. Goal blood pressure 130/80 or less.   Urine negative for urinary tract infection.  Lab today.  Flu vaccine today.  Tdap vaccine today. Thank you for choosing Primary Care at [facility] for your medical home!    Brianna Gutierrez was seen by Brianna Herter, NP today.   Brianna Gutierrez's primary care provider is Brianna Quintanar Zachery Dauer, NP.   For the best care possible,  you should try to see Brianna Fruits, NP whenever you come to clinic.   We look forward to seeing you again soon!  If you have any questions about your visit today,  please call us at [phone]  Or feel free to reach your provider via [place].    Hypertension, Adult Hypertension is another name for high blood pressure. High blood pressure forces your heart to work harder to pump blood. This can cause problems over time. There are two numbers in a blood pressure reading. There is a top number (systolic) over a bottom number (diastolic). It is best to have a blood pressure that is below 120/80. Healthy choices can help lower your blood pressure, or you may need medicine to help lower it. What are the causes? The cause of this condition is not known. Some conditions may be related to high blood pressure. What increases the risk?  Smoking.  Having type 2 diabetes mellitus, high cholesterol, or both.  Not getting enough exercise or physical activity.  Being overweight.  Having too much fat, sugar, calories, or salt (sodium) in your diet.  Drinking too much alcohol.  Having long-term (chronic) kidney disease.  Having a family history of high blood pressure.  Age. Risk increases with age.  Race.  You may be at higher risk if you are African American.  Gender. Men are at higher risk than women before age 41. After age 77, women are at higher risk than men.  Having obstructive sleep apnea.  Stress. What are the signs or symptoms?  High blood pressure may not cause symptoms. Very high blood pressure (hypertensive crisis) may cause: ? Headache. ? Feelings of worry or nervousness (anxiety). ? Shortness of breath. ? Nosebleed. ? A feeling of being sick to your stomach (nausea). ? Throwing up (vomiting). ? Changes in how you see. ? Very bad chest pain. ? Seizures. How is this treated?  This condition is treated by making healthy lifestyle changes, such as: ? Eating healthy foods. ? Exercising more. ? Drinking less alcohol.  Your health care provider may prescribe medicine if lifestyle changes are not enough to get your blood pressure under control, and if: ? Your top number is above 130. ? Your bottom number is above 80.  Your personal target blood pressure may vary. Follow these instructions at home: Eating and drinking  If told, follow the DASH eating plan. To follow this plan: ? Fill one half of your plate at each meal with Gutierrez and vegetables. ? Fill one fourth of your plate at each meal with whole grains. Whole grains include whole-wheat pasta, brown rice, and whole-grain bread. ? Eat or drink low-fat dairy products, such as skim milk or low-fat yogurt. ? Fill one fourth of your plate at each meal  with low-fat (lean) proteins. Low-fat proteins include fish, chicken without skin, eggs, beans, and tofu. ? Avoid fatty meat, cured and processed meat, or chicken with skin. ? Avoid pre-made or processed food.  Eat less than 1,500 mg of salt each day.  Do not drink alcohol if: ? Your doctor tells you not to drink. ? You are pregnant, may be pregnant, or are planning to become pregnant.  If you drink alcohol: ? Limit how much you use to:  0-1 drink a day for  women.  0-2 drinks a day for men. ? Be aware of how much alcohol is in your drink. In the [place] one drink equals one 12 oz bottle of beer (355 mL), one 5 oz glass of wine (148 mL), or one 1 oz glass of hard liquor (44 mL).   Lifestyle  Work with your doctor to stay at a healthy weight or to lose weight. Ask your doctor what the best weight is for you.  Get at least 30 minutes of exercise most days of the week. This may include walking, swimming, or biking.  Get at least 30 minutes of exercise that strengthens your muscles (resistance exercise) at least 3 days a week. This may include lifting weights or doing Pilates.  Do not use any products that contain nicotine or tobacco, such as cigarettes, e-cigarettes, and chewing tobacco. If you need help quitting, ask your doctor.  Check your blood pressure at home as told by your doctor.  Keep all follow-up visits as told by your doctor. This is important.   Medicines  Take over-the-counter and prescription medicines only as told by your doctor. Follow directions carefully.  Do not skip doses of blood pressure medicine. The medicine does not work as well if you skip doses. Skipping doses also puts you at risk for problems.  Ask your doctor about side effects or reactions to medicines that you should watch for. Contact a doctor if you:  Think you are having a reaction to the medicine you are taking.  Have headaches that keep coming back (recurring).  Feel dizzy.  Have swelling in your ankles.  Have trouble with your vision. Get help right away if you:  Get a very bad headache.  Start to feel mixed up (confused).  Feel weak or numb.  Feel faint.  Have very bad pain in your: ? Chest. ? Belly (abdomen).  Throw up more than once.  Have trouble breathing. Summary  Hypertension is another name for high blood pressure.  High blood pressure forces your heart to work harder to pump blood.  For most people, a normal blood  pressure is less than 120/80.  Making healthy choices can help lower blood pressure. If your blood pressure does not get lower with healthy choices, you may need to take medicine. This information is not intended to replace advice given to you by your health care provider. Make sure you discuss any questions you have with your health care provider. Document Revised: 06/25/2018 Document Reviewed: 06/25/2018 Elsevier Patient Education  2021 [employer].

## 2020-11-22 NOTE — Progress Notes (Signed)
Establish care Left side kidney pain  Wants flu/tetanus

## 2020-11-23 ENCOUNTER — Telehealth: Payer: Self-pay | Admitting: General Practice

## 2020-11-23 LAB — BASIC METABOLIC PANEL
BUN/Creatinine Ratio: 14 (ref 9–23)
BUN: 11 mg/dL (ref 6–24)
CO2: 20 mmol/L (ref 20–29)
Calcium: 9.1 mg/dL (ref 8.7–10.2)
Chloride: 104 mmol/L (ref 96–106)
Creatinine, Ser: 0.78 mg/dL (ref 0.57–1.00)
GFR calc Af Amer: 107 mL/min/{1.73_m2} (ref >59)
GFR calc non Af Amer: 93 mL/min/{1.73_m2} (ref >59)
Glucose: 83 mg/dL (ref 65–99)
Potassium: 3.9 mmol/L (ref 3.5–5.2)
Sodium: 136 mmol/L (ref 134–144)

## 2020-11-23 NOTE — Telephone Encounter (Signed)
Pt is in need of a call  regarding the Urine test done on 11/22/20 at [facility]. Please advise and thank you

## 2020-11-23 NOTE — Progress Notes (Signed)
Please call patient with update.   Kidney function normal.   UTI negative.   If symptoms persist may want to come in for a vaginal swab for testing of gonorrhea, chlamydia, trichomonas, yeast infection, and bacterial vaginitis. May schedule a nurse visit for this.

## 2020-11-24 ENCOUNTER — Telehealth: Payer: Self-pay | Admitting: General Practice

## 2020-11-24 ENCOUNTER — Telehealth: Payer: Self-pay

## 2020-11-24 NOTE — Telephone Encounter (Signed)
Pt called in stating that the BP med she has been started on is causing her to vomit and have a headache. Pt was advised to stop medication and will give her call as soon as provider is back in ofc

## 2020-11-24 NOTE — Telephone Encounter (Signed)
Pt called to speak to a nurse regarding dizziness since taking new BP medication. Please advise and thank you

## 2020-11-24 NOTE — Telephone Encounter (Signed)
Pt was contacted and provider made aware of issue

## 2020-11-25 MED ORDER — AMLODIPINE BESYLATE 5 MG PO TABS: 5.0000 mg | ORAL_TABLET | Freq: Every day | ORAL | 0 refills | Status: DC

## 2020-11-25 NOTE — Addendum Note (Signed)
Addended by: [name] on: 11/25/2020 01:51 PM   Modules accepted: Orders

## 2020-11-25 NOTE — Telephone Encounter (Signed)
Please call patient with update.    Lisinopril-Hydrochlorothiazide discontinued.  Begin Amlodipine 5 mg daily for high blood pressure. Follow-up as scheduled with primary provider.

## 2020-11-30 NOTE — Telephone Encounter (Signed)
Pt aware to start Amlodipine 5mg 

## 2020-12-23 ENCOUNTER — Other Ambulatory Visit: Payer: Self-pay

## 2020-12-23 ENCOUNTER — Ambulatory Visit (INDEPENDENT_AMBULATORY_CARE_PROVIDER_SITE_OTHER): Payer: BC Managed Care – PPO | Admitting: Family

## 2020-12-23 ENCOUNTER — Other Ambulatory Visit: Payer: Self-pay | Admitting: Family

## 2020-12-23 VITALS — BP 126/91 | HR 90 | Ht 62.13 in | Wt 179.2 lb

## 2020-12-23 DIAGNOSIS — I10 Essential (primary) hypertension: Secondary | ICD-10-CM

## 2020-12-23 DIAGNOSIS — Z0000 Encounter for general adult medical examination without abnormal findings: Secondary | ICD-10-CM | POA: Diagnosis not present

## 2020-12-23 DIAGNOSIS — Z130 Encounter for screening for diseases of the blood and blood-forming organs and certain disorders involving the immune mechanism: Secondary | ICD-10-CM | POA: Diagnosis not present

## 2020-12-23 DIAGNOSIS — Z13220 Encounter for screening for lipoid disorders: Secondary | ICD-10-CM | POA: Diagnosis not present

## 2020-12-23 DIAGNOSIS — Z131 Encounter for screening for diabetes mellitus: Secondary | ICD-10-CM | POA: Diagnosis not present

## 2020-12-23 DIAGNOSIS — Z1329 Encounter for screening for other suspected endocrine disorder: Secondary | ICD-10-CM | POA: Diagnosis not present

## 2020-12-23 DIAGNOSIS — Z13228 Encounter for screening for other metabolic disorders: Secondary | ICD-10-CM

## 2020-12-23 DIAGNOSIS — K5900 Constipation, unspecified: Secondary | ICD-10-CM

## 2020-12-23 DIAGNOSIS — F32A Depression, unspecified: Secondary | ICD-10-CM

## 2020-12-23 DIAGNOSIS — F419 Anxiety disorder, unspecified: Secondary | ICD-10-CM

## 2020-12-23 DIAGNOSIS — Z124 Encounter for screening for malignant neoplasm of cervix: Secondary | ICD-10-CM

## 2020-12-23 MED ORDER — AMLODIPINE BESYLATE 5 MG PO TABS: 5.0000 mg | ORAL_TABLET | Freq: Every day | ORAL | 0 refills | Status: DC

## 2020-12-23 MED ORDER — DOCUSATE SODIUM 100 MG PO CAPS: 100.0000 mg | ORAL_CAPSULE | Freq: Two times a day (BID) | ORAL | 0 refills | Status: DC | PRN

## 2020-12-23 MED ORDER — SERTRALINE HCL 25 MG PO TABS: 25.0000 mg | ORAL_TABLET | Freq: Every day | ORAL | 0 refills | Status: DC
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
## 2020-12-23 NOTE — Patient Instructions (Addendum)
Annual physical exam and labs today. Will call with results.   Referral to Social Work and Psychiatry for counseling.  [facility] [place], [place] [address] Phone # [phone] Walk-Ins Welcome  Zoloft for anxiety. Follow-up in 4 weeks or sooner if needed with primary provider.   Continue Amlodipine for high blood pressure.  Preventive Care 45-45 Years Old, Female Preventive care refers to lifestyle choices and visits with your health care provider that can promote health and wellness. This includes:  A yearly physical exam. This is also called an annual wellness visit.  Regular dental and eye exams.  Immunizations.  Screening for certain conditions.  Healthy lifestyle choices, such as: ? Eating a healthy diet. ? Getting regular exercise. ? Not using drugs or products that contain nicotine and tobacco. ? Limiting alcohol use. What can I expect for my preventive care visit? Physical exam Your health care provider will check your:  Height and weight. These may be used to calculate your BMI (body mass index). BMI is a measurement that tells if you are at a healthy weight.  Heart rate and blood pressure.  Body temperature.  Skin for abnormal spots. Counseling Your health care provider may ask you questions about your:  Past medical problems.  Family's medical history.  Alcohol, tobacco, and drug use.  Emotional well-being.  Home life and relationship well-being.  Sexual activity.  Diet, exercise, and sleep habits.  Work and work Statistician.  Access to firearms.  Method of birth control.  Menstrual cycle.  Pregnancy history. What immunizations do I need? Vaccines are usually given at various ages, according to a schedule. Your health care provider will recommend vaccines for you based on your age, medical history, and lifestyle or other factors, such as travel or where you work.   What tests do I need? Blood  tests  Lipid and cholesterol levels. These may be checked every 5 years, or more often if you are over 50 years old.  Hepatitis C test.  Hepatitis B test. Screening  Lung cancer screening. You may have this screening every year starting at age 37 if you have a 30-pack-year history of smoking and currently smoke or have quit within the past 15 years.  Colorectal cancer screening. ? All adults should have this screening starting at age 65 and continuing until age 45. ? Your health care provider may recommend screening at age 52 if you are at increased risk. ? You will have tests every 1-10 years, depending on your results and the type of screening test.  Diabetes screening. ? This is done by checking your blood sugar (glucose) after you have not eaten for a while (fasting). ? You may have this done every 1-3 years.  Mammogram. ? This may be done every 1-2 years. ? Talk with your health care provider about when you should start having regular mammograms. This may depend on whether you have a family history of breast cancer.  BRCA-related cancer screening. This may be done if you have a family history of breast, ovarian, tubal, or peritoneal cancers.  Pelvic exam and Pap test. ? This may be done every 3 years starting at age 41. ? Starting at age 5, this may be done every 5 years if you have a Pap test in combination with an HPV test. Other tests  STD (sexually transmitted disease) testing, if you are at risk.  Bone density scan. This is done to screen for osteoporosis. You  may have this scan if you are at high risk for osteoporosis. Talk with your health care provider about your test results, treatment options, and if necessary, the need for more tests. Follow these instructions at home: Eating and drinking  Eat a diet that includes fresh fruits and vegetables, whole grains, lean protein, and low-fat dairy products.  Take vitamin and mineral supplements as recommended by your  health care provider.  Do not drink alcohol if: ? Your health care provider tells you not to drink. ? You are pregnant, may be pregnant, or are planning to become pregnant.  If you drink alcohol: ? Limit how much you have to 0-1 drink a day. ? Be aware of how much alcohol is in your drink. In the [place] one drink equals one 12 oz bottle of beer (355 mL), one 5 oz glass of wine (148 mL), or one 1 oz glass of hard liquor (44 mL).   Lifestyle  Take daily care of your teeth and gums. Brush your teeth every morning and night with fluoride toothpaste. Floss one time each day.  Stay active. Exercise for at least 30 minutes 5 or more days each week.  Do not use any products that contain nicotine or tobacco, such as cigarettes, e-cigarettes, and chewing tobacco. If you need help quitting, ask your health care provider.  Do not use drugs.  If you are sexually active, practice safe sex. Use a condom or other form of protection to prevent STIs (sexually transmitted infections).  If you do not wish to become pregnant, use a form of birth control. If you plan to become pregnant, see your health care provider for a prepregnancy visit.  If told by your health care provider, take low-dose aspirin daily starting at age 83.  Find healthy ways to cope with stress, such as: ? Meditation, yoga, or listening to music. ? Journaling. ? Talking to a trusted person. ? Spending time with friends and family. Safety  Always wear your seat belt while driving or riding in a vehicle.  Do not drive: ? If you have been drinking alcohol. Do not ride with someone who has been drinking. ? When you are tired or distracted. ? While texting.  Wear a helmet and other protective equipment during sports activities.  If you have firearms in your house, make sure you follow all gun safety procedures. What's next?  Visit your health care provider once a year for an annual wellness visit.  Ask your health care  provider how often you should have your eyes and teeth checked.  Stay up to date on all vaccines. This information is not intended to replace advice given to you by your health care provider. Make sure you discuss any questions you have with your health care provider. Document Revised: 07/19/2020 Document Reviewed: 06/26/2018 Elsevier Patient Education  2021 [employer].

## 2020-12-24 LAB — HEMOGLOBIN A1C
Est. average glucose Bld gHb Est-mCnc: 123 mg/dL
Hgb A1c MFr Bld: 5.9 % — ABNORMAL HIGH (ref 4.8–5.6)

## 2020-12-24 LAB — LIPID PANEL
Chol/HDL Ratio: 4.4 ratio (ref 0.0–4.4)
Cholesterol, Total: 205 mg/dL — ABNORMAL HIGH (ref 100–199)
HDL: 47 mg/dL (ref >39)
LDL Chol Calc (NIH): 142 mg/dL — ABNORMAL HIGH (ref 0–99)
Triglycerides: 90 mg/dL (ref 0–149)
VLDL Cholesterol Cal: 16 mg/dL (ref 5–40)

## 2020-12-24 LAB — HEPATIC FUNCTION PANEL
ALT: 19 IU/L (ref 0–32)
AST: 13 IU/L (ref 0–40)
Albumin: 4.1 g/dL (ref 3.8–4.8)
Alkaline Phosphatase: 104 IU/L (ref 44–121)
Bilirubin Total: <0.2 mg/dL (ref 0.0–1.2)
Bilirubin, Direct: <0.1 mg/dL (ref 0.00–0.40)
Total Protein: 7.3 g/dL (ref 6.0–8.5)

## 2020-12-24 LAB — CBC
Hematocrit: 43.5 % (ref 34.0–46.6)
Hemoglobin: 13.6 g/dL (ref 11.1–15.9)
MCH: 22.8 pg — ABNORMAL LOW (ref 26.6–33.0)
MCHC: 31.3 g/dL — ABNORMAL LOW (ref 31.5–35.7)
MCV: 73 fL — ABNORMAL LOW (ref 79–97)
Platelets: 375 10*3/uL (ref 150–450)
RBC: 5.96 x10E6/uL — ABNORMAL HIGH (ref 3.77–5.28)
RDW: 14.8 % (ref 11.7–15.4)
WBC: 6.6 10*3/uL (ref 3.4–10.8)

## 2020-12-24 LAB — TSH: TSH: 0.952 u[IU]/mL (ref 0.450–4.500)

## 2020-12-27 NOTE — Addendum Note (Signed)
Addended by: [name] on: 12/27/2020 08:42 AM   Modules accepted: Orders

## 2020-12-27 NOTE — Progress Notes (Signed)
Please call patient with update.   Liver function normal.   Thyroid function normal.   Cholesterol mildly higher than expected. High cholesterol may increase risk of heart attack and/or stroke. Consider eating more fruits, vegetables, and lean baked meats such as chicken or fish. Moderate intensity exercise at least 150 minutes as tolerated per week may help as well. Will recheck in about 6 months. Patient encouraged to schedule appointment for this around that time.  Your hemoglobin A1c is consistent with pre-diabetes. Practice healthy eating habits of fresh fruit and vegetables, lean baked meats such as chicken, fish, and [place]; limit breads, rice, pastas, and desserts; practice regular aerobic exercise (at least 150 minutes a week as tolerated) and will recheck in about 6 months. Patient encouraged to schedule appointment for this around that time.   Blood count mildly low. Adding an iron panel to check for deficiency anemia. Please schedule lab appointment while patient is on the phone to come in soon.

## 2021-01-05 ENCOUNTER — Other Ambulatory Visit: Payer: Self-pay

## 2021-01-05 ENCOUNTER — Ambulatory Visit (INDEPENDENT_AMBULATORY_CARE_PROVIDER_SITE_OTHER): Payer: BC Managed Care – PPO | Admitting: Licensed Clinical Social Worker

## 2021-01-05 DIAGNOSIS — F4323 Adjustment disorder with mixed anxiety and depressed mood: Secondary | ICD-10-CM
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
## 2021-01-23 ENCOUNTER — Telehealth (INDEPENDENT_AMBULATORY_CARE_PROVIDER_SITE_OTHER): Payer: BC Managed Care – PPO | Admitting: Family

## 2021-01-23 DIAGNOSIS — F419 Anxiety disorder, unspecified: Secondary | ICD-10-CM

## 2021-01-23 DIAGNOSIS — F32A Depression, unspecified: Secondary | ICD-10-CM | POA: Diagnosis not present

## 2021-01-23 DIAGNOSIS — I10 Essential (primary) hypertension: Secondary | ICD-10-CM | POA: Diagnosis not present

## 2021-01-23 MED ORDER — SERTRALINE HCL 50 MG PO TABS: 50.0000 mg | ORAL_TABLET | Freq: Every day | ORAL | 0 refills | Status: DC
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
## 2021-01-23 NOTE — Progress Notes (Signed)
Anxiety -zoloft f/u

## 2021-02-06 NOTE — Progress Notes (Signed)
Patient ID: Brianna Gutierrez, female    DOB: [birth year]  MRN: [id number]  CC: Hypertension and Anxiety/Depression Follow-Up  Subjective: Brianna Gutierrez is a 45 y.o. female who presents for Her concerns today include:   1. HYPERTENSION FOLLOW-UP: 01/23/2021: - Continue Amlodipine as prescribed. - Counseled that diastolic blood pressures in the 80's to 90's is ok for now. Will not change medication regimen at this time as do not want to decrease systolic pressures too low, currently 120's to occasional 140's. Patient agreeable.   02/07/2021: Currently taking: see medication list Med Adherence: [x]  Yes    []  No Medication side effects: []  Yes    [x]  No Home Monitoring?: [x]  Yes    []  No Home BP results range: [x]  Yes, 140's/90's SOB? []  Yes    [x]  No Chest Pain?: []  Yes    [x]  No Leg swelling?: []  Yes    [x]  No  2. ANXIETY/DEPRESSION FOLLOW-UP: 01/23/2021: - Stable.  - Denies thoughts of self-harm, suicidal ideations, and homicidal ideations.  - Increase Sertraline from 25 mg daily to 50 mg daily.  - Follow-up with primary provider in 2 to 4 weeks or sooner if needed.  02/07/2021: Doing ok on current dose. Doesn't feel the complete results she is expecting. Having trouble with sleeping, taking over-the-counter Benadryl. Denies thoughts of self-harm, suicidal ideations, and homicidal ideations.   3. ALLERGIES: Has environmental allergies. Requesting medication.  4. ECZEMA: Located over generalized body in patches. Endorses dry skin and itching. Using over-the-counter Vaseline lotion and making worse.   5. TOENAIL CONCERN: Right great toenail discoloration and toenail lifting. Reports has been this way for some time. Recalls going to the nail salon for a pedicure where the technician said she had an ingrown toenail and removed it.   6. PAP SMEAR REFERRAL: Needs updated referral for health insurance purposes.   Patient Active Problem List   Diagnosis Date Noted  . Cholelithiasis  01/13/2019  . Pre-diabetes 11/15/2016  . Uterine fibroid 04/27/2016  . Abnormal uterine bleeding (AUB) 09/28/2015  . History of bilateral tubal ligation 09/28/2015  . Tobacco abuse 09/28/2015     Current Outpatient Medications on File Prior to Visit  Medication Sig Dispense Refill  . [DISCONTINUED] dicyclomine (BENTYL) 20 MG tablet Take 1 tablet (20 mg total) by mouth 2 (two) times daily. 20 tablet 0  . [DISCONTINUED] omeprazole (PRILOSEC) 20 MG capsule Take 1 capsule (20 mg total) by mouth daily. 30 capsule 1   No current facility-administered medications on file prior to visit.    Allergies  Allergen Reactions  . Oxycodone-Acetaminophen Hives    Pt tolerates Hydrocodone  . Percocet [Oxycodone-Acetaminophen] Hives    Pt tolerates Hydrocodone  . Tramadol Other (See Comments)    Headache    Social History   Socioeconomic History  . Marital status: Single    Spouse name: Not on file  . Number of children: Not on file  . Years of education: Not on file  . Highest education level: Not on file  Occupational History  . Not on file  Tobacco Use  . Smoking status: Current Every Day Smoker    Packs/day: 0.10    Types: Cigarettes    Last attempt to quit: 07/15/2014    Years since quitting: 6.5  . Smokeless tobacco: Never Used  Vaping Use  . Vaping Use: Never used  Substance and Sexual Activity  . Alcohol use: Not Currently    Comment: socially  . Drug use: No  . Sexual  activity: Not Currently    Partners: Male    Birth control/protection: Surgical  Other Topics Concern  . Not on file  Social History Narrative  . Not on file   Social Determinants of Health   Financial Resource Strain: Not on file  Food Insecurity: Not on file  Transportation Needs: Not on file  Physical Activity: Not on file  Stress: Not on file  Social Connections: Not on file  Intimate Partner Violence: Not on file    Family History  Problem Relation Age of Onset  . Diabetes Other   .  Hypertension Other     Past Surgical History:  Procedure Laterality Date  . CESAREAN SECTION    . CHOLECYSTECTOMY N/A 01/14/2019   Procedure: LAPAROSCOPIC CHOLECYSTECTOMY WITH POSSIBLE  INTRAOPERATIVE CHOLANGIOGRAM;  Surgeon: Armandina Gemma, MD;  Location: [facility];  Service: General;  Laterality: N/A;  . DILITATION & CURRETTAGE/HYSTROSCOPY WITH HYDROTHERMAL ABLATION N/A 06/22/2016   Procedure: DILATATION & CURETTAGE/HYSTEROSCOPY WITH HYDROTHERMAL ABLATION;  Surgeon: Shelly Bombard, MD;  Location: [facility];  Service: Gynecology;  Laterality: N/A;  . TUBAL LIGATION      ROS: Review of Systems Negative except as stated above  PHYSICAL EXAM: BP 137/84 (BP Location: Left Arm, Patient Position: Sitting)   Pulse 89   Ht 5' 2.13" (1.578 m)   Wt 174 lb 9.6 oz (79.2 kg)   SpO2 96%   BMI 31.81 kg/m   Physical Exam HENT:     Head: Normocephalic and atraumatic.  Eyes:     Extraocular Movements: Extraocular movements intact.     Conjunctiva/sclera: Conjunctivae normal.     Pupils: Pupils are equal, round, and reactive to light.  Cardiovascular:     Rate and Rhythm: Normal rate and regular rhythm.     Pulses: Normal pulses.     Heart sounds: Normal heart sounds.  Pulmonary:     Effort: Pulmonary effort is normal.     Breath sounds: Normal breath sounds.  Musculoskeletal:     Cervical back: Normal range of motion and neck supple.     Comments: Right great toenail fungus and lifting.  Neurological:     General: No focal deficit present.     Mental Status: She is alert and oriented to person, place, and time.  Psychiatric:        Mood and Affect: Mood normal.        Behavior: Behavior normal.     ASSESSMENT AND PLAN: 1. Essential hypertension: - Blood pressure not at goal during today's visit. Patient asymptomatic without chest pressure, chest pain, palpitations, shortness of breath, and worst headache of life. - Home blood pressure readings are higher than goal.  - Increase  Amlodipine from 5 mg to 10 mg daily.  - Counseled on blood pressure goal of less than 130/80, low-sodium, DASH diet, medication compliance, 150 minutes of moderate intensity exercise per week as tolerated. Discussed medication compliance, adverse effects. - Follow-up with primary provider in 4 weeks or sooner if needed.  - amLODipine (NORVASC) 10 MG tablet; Take 1 tablet (10 mg total) by mouth daily.  Dispense: 30 tablet; Refill: 0  2. Anxiety and depression: - Stable.  - Denies thoughts of self-harm, suicidal ideations, and homicidal ideations.  - Increase Sertraline from 50 mg daily to 100 mg daily.  - Follow-up with primary provider in 4 weeks or sooner if needed.  - sertraline (ZOLOFT) 100 MG tablet; Take 1 tablet (100 mg total) by mouth daily.  Dispense: 30 tablet; Refill:  0  3. Insomnia, unspecified type: - Begin Hydroxyzine as prescribed. Counseled patient to not consume if operating heavy machinery or driving. Counseled patient to not consume with alcohol. - Follow-up with primary provider in 4 weeks or sooner if needed. - hydrOXYzine (ATARAX/VISTARIL) 25 MG tablet; Take 1 tablet (25 mg total) by mouth at bedtime.  Dispense: 30 tablet; Refill: 0  4. Environmental allergies: - Begin Cetirizine and Fluticasone as prescribed.  - Follow-up with primary provider as scheduled. - cetirizine (ZYRTEC) 10 MG tablet; Take 1 tablet (10 mg total) by mouth daily.  Dispense: 30 tablet; Refill: 4 - fluticasone (FLONASE) 50 MCG/ACT nasal spray; Place 1 spray into both nostrils daily.  Dispense: 9.9 mL; Refill: 1  5. Eczema, unspecified type: - Hydrocortisone ointment as prescribed.  - Over-the-counter Cetaphil and Aquaphor to keep skin hydrated.  - Follow-up with primary provider as scheduled.  - hydrocortisone 2.5 % ointment; Apply topically 2 (two) times daily.  Dispense: 20 g; Refill: 1  6. Toenail fungus: - Right great toenail appears to have fungus. - Referral to Podiatry for further  evaluation and management. - Ambulatory referral to Podiatry  7. Pap smear for cervical cancer screening: - Referral to Gynecology for cervical cancer screening by PAP smear.  - Referral placed at [facility], [address], [place], [place] [address], [phone] - Ambulatory referral to Gynecology   Patient was given the opportunity to ask questions.  Patient verbalized understanding of the plan and was able to repeat key elements of the plan. Patient was given clear instructions to go to Emergency Department or return to medical center if symptoms don't improve, worsen, or new problems develop.The patient verbalized understanding.   Orders Placed This Encounter  Procedures  . Ambulatory referral to Gynecology  . Ambulatory referral to Podiatry     Requested Prescriptions   Signed Prescriptions Disp Refills  . cetirizine (ZYRTEC) 10 MG tablet 30 tablet 4    Sig: Take 1 tablet (10 mg total) by mouth daily.  . fluticasone (FLONASE) 50 MCG/ACT nasal spray 9.9 mL 1    Sig: Place 1 spray into both nostrils daily.  . hydrocortisone 2.5 % ointment 20 g 1    Sig: Apply topically 2 (two) times daily.  . hydrOXYzine (ATARAX/VISTARIL) 25 MG tablet 30 tablet 0    Sig: Take 1 tablet (25 mg total) by mouth at bedtime.  . sertraline (ZOLOFT) 100 MG tablet 30 tablet 0    Sig: Take 1 tablet (100 mg total) by mouth daily.  [place] amLODipine (NORVASC) 10 MG tablet 30 tablet 0    Sig: Take 1 tablet (10 mg total) by mouth daily.    Return in about 4 weeks (around 03/07/2021) for Telephone Visit hypertension, anxiety/depression .  [name], NP

## 2021-02-07 ENCOUNTER — Other Ambulatory Visit: Payer: Self-pay

## 2021-02-07 ENCOUNTER — Ambulatory Visit (INDEPENDENT_AMBULATORY_CARE_PROVIDER_SITE_OTHER): Payer: BC Managed Care – PPO | Admitting: Family

## 2021-02-07 ENCOUNTER — Encounter: Payer: Self-pay | Admitting: Family

## 2021-02-07 VITALS — BP 137/84 | HR 89 | Ht 62.13 in | Wt 174.6 lb

## 2021-02-07 DIAGNOSIS — G4700 Insomnia, unspecified: Secondary | ICD-10-CM

## 2021-02-07 DIAGNOSIS — I10 Essential (primary) hypertension: Secondary | ICD-10-CM | POA: Diagnosis not present

## 2021-02-07 DIAGNOSIS — Z124 Encounter for screening for malignant neoplasm of cervix: Secondary | ICD-10-CM

## 2021-02-07 DIAGNOSIS — F419 Anxiety disorder, unspecified: Secondary | ICD-10-CM | POA: Diagnosis not present

## 2021-02-07 DIAGNOSIS — Z9109 Other allergy status, other than to drugs and biological substances: Secondary | ICD-10-CM

## 2021-02-07 DIAGNOSIS — B351 Tinea unguium: Secondary | ICD-10-CM

## 2021-02-07 DIAGNOSIS — F32A Depression, unspecified: Secondary | ICD-10-CM

## 2021-02-07 DIAGNOSIS — L309 Dermatitis, unspecified: Secondary | ICD-10-CM

## 2021-02-07 MED ORDER — FLUTICASONE PROPIONATE 50 MCG/ACT NA SUSP
1.0000 | Freq: Every day | NASAL | 1 refills | Status: DC
Start: 2021-02-07 — End: 2021-04-27

## 2021-02-07 MED ORDER — AMLODIPINE BESYLATE 10 MG PO TABS: 10.0000 mg | ORAL_TABLET | Freq: Every day | ORAL | 0 refills | Status: DC

## 2021-02-07 MED ORDER — SERTRALINE HCL 100 MG PO TABS: 100.0000 mg | ORAL_TABLET | Freq: Every day | ORAL | 0 refills | Status: DC

## 2021-02-07 MED ORDER — CETIRIZINE HCL 10 MG PO TABS: 10.0000 mg | ORAL_TABLET | Freq: Every day | ORAL | 4 refills | Status: DC

## 2021-02-07 MED ORDER — HYDROCORTISONE 2.5 % EX OINT
TOPICAL_OINTMENT | Freq: Two times a day (BID) | CUTANEOUS | 1 refills | Status: DC
Start: 2021-02-07 — End: 2021-06-08

## 2021-02-07 MED ORDER — HYDROXYZINE HCL 25 MG PO TABS: 25.0000 mg | ORAL_TABLET | Freq: Every day | ORAL | 0 refills | Status: AC

## 2021-02-07 NOTE — Patient Instructions (Addendum)
Increase Amlodipine to 10 mg daily for high blood pressure. Follow-up in 4 weeks.   Increase Zoloft for anxiety and depression. Follow-up in 4 weeks.   Begin Hydroxyzine for insomnia. Follow-up in 4 weeks.   Continue Flonase for allergies.   Begin Cetirizine for allergies.   Hydrocortisone for eczema. Also, use over-the-counter Cetaphil or Aquaphor.   Referral to Podiatry.  Referral to Gynecology. Located at [facility], [place], [place] [address], [phone] Generalized Anxiety Disorder, Adult Generalized anxiety disorder (GAD) is a mental health condition. Unlike normal worries, anxiety related to GAD is not triggered by a specific event. These worries do not fade or get better with time. GAD interferes with relationships, work, and school. GAD symptoms can vary from mild to severe. People with severe GAD can have intense waves of anxiety with physical symptoms that are similar to panic attacks. What are the causes? The exact cause of GAD is not known, but the following are believed to have an impact:  Differences in natural brain chemicals.  Genes passed down from parents to children.  Differences in the way threats are perceived.  Development during childhood.  Personality. What increases the risk? The following factors may make you more likely to develop this condition:  Being female.  Having a family history of anxiety disorders.  Being very shy.  Experiencing very stressful life events, such as the death of a loved one.  Having a very stressful family environment. What are the signs or symptoms? People with GAD often worry excessively about many things in their lives, such as their health and family. Symptoms may also include:  Mental and emotional symptoms: ? Worrying excessively about natural disasters. ? Fear of being late. ? Difficulty concentrating. ? Fears that others are judging your performance.  Physical  symptoms: ? Fatigue. ? Headaches, muscle tension, muscle twitches, trembling, or feeling shaky. ? Feeling like your heart is pounding or beating very fast. ? Feeling out of breath or like you cannot take a deep breath. ? Having trouble falling asleep or staying asleep, or experiencing restlessness. ? Sweating. ? Nausea, diarrhea, or irritable bowel syndrome (IBS).  Behavioral symptoms: ? Experiencing erratic moods or irritability. ? Avoidance of new situations. ? Avoidance of people. ? Extreme difficulty making decisions. How is this diagnosed? This condition is diagnosed based on your symptoms and medical history. You will also have a physical exam. Your health care provider may perform tests to rule out other possible causes of your symptoms. To be diagnosed with GAD, a person must have anxiety that:  Is out of his or her control.  Affects several different aspects of his or her life, such as work and relationships.  Causes distress that makes him or her unable to take part in normal activities.  Includes at least three symptoms of GAD, such as restlessness, fatigue, trouble concentrating, irritability, muscle tension, or sleep problems. Before your health care provider can confirm a diagnosis of GAD, these symptoms must be present more days than they are not, and they must last for 6 months or longer. How is this treated? This condition may be treated with:  Medicine. Antidepressant medicine is usually prescribed for long-term daily control. Anti-anxiety medicines may be added in severe cases, especially when panic attacks occur.  Talk therapy (psychotherapy). Certain types of talk therapy can be helpful in treating GAD by providing support, education, and guidance. Options include: ? Cognitive behavioral therapy (CBT). People learn coping skills  and self-calming techniques to ease their physical symptoms. They learn to identify unrealistic thoughts and behaviors and to replace  them with more appropriate thoughts and behaviors. ? Acceptance and commitment therapy (ACT). This treatment teaches people how to be mindful as a way to cope with unwanted thoughts and feelings. ? Biofeedback. This process trains you to manage your body's response (physiological response) through breathing techniques and relaxation methods. You will work with a therapist while machines are used to monitor your physical symptoms.  Stress management techniques. These include yoga, meditation, and exercise. A mental health specialist can help determine which treatment is best for you. Some people see improvement with one type of therapy. However, other people require a combination of therapies.   Follow these instructions at home: Lifestyle  Maintain a consistent routine and schedule.  Anticipate stressful situations. Create a plan, and allow extra time to work with your plan.  Practice stress management or self-calming techniques that you have learned from your therapist or your health care provider. General instructions  Take over-the-counter and prescription medicines only as told by your health care provider.  Understand that you are likely to have setbacks. Accept this and be kind to yourself as you persist to take better care of yourself.  Recognize and accept your accomplishments, even if you judge them as small.  Keep all follow-up visits as told by your health care provider. This is important. Contact a health care provider if:  Your symptoms do not get better.  Your symptoms get worse.  You have signs of depression, such as: ? A persistently sad or irritable mood. ? Loss of enjoyment in activities that used to bring you joy. ? Change in weight or eating. ? Changes in sleeping habits. ? Avoiding friends or family members. ? Loss of energy for normal tasks. ? Feelings of guilt or worthlessness. Get help right away if:  You have serious thoughts about hurting yourself or  others. If you ever feel like you may hurt yourself or others, or have thoughts about taking your own life, get help right away. Go to your nearest emergency department or:  Call your local emergency services (911 in the [place]  Call a suicide crisis helpline, such as the [facility] at 2150679049. This is open 24 hours a day in the [place]  Text the Crisis Text Line at (806) 725-2313 (in the [place] Summary  Generalized anxiety disorder (GAD) is a mental health condition that involves worry that is not triggered by a specific event.  People with GAD often worry excessively about many things in their lives, such as their health and family.  GAD may cause symptoms such as restlessness, trouble concentrating, sleep problems, frequent sweating, nausea, diarrhea, headaches, and trembling or muscle twitching.  A mental health specialist can help determine which treatment is best for you. Some people see improvement with one type of therapy. However, other people require a combination of therapies. This information is not intended to replace advice given to you by your health care provider. Make sure you discuss any questions you have with your health care provider. Document Revised: 08/05/2019 Document Reviewed: 08/05/2019 Elsevier Patient Education  [place].

## 2021-02-07 NOTE — Progress Notes (Signed)
Hypertension f/u Eczema flare  Would like to try cetrizine for allergies Benadryl makes her drowsy Still experiencing insomnia Needs refill on nasal spray  Referral to [facility] for Pap

## 2021-02-20 ENCOUNTER — Other Ambulatory Visit: Payer: Self-pay

## 2021-02-20 ENCOUNTER — Ambulatory Visit (INDEPENDENT_AMBULATORY_CARE_PROVIDER_SITE_OTHER): Payer: BC Managed Care – PPO | Admitting: Podiatry

## 2021-02-20 ENCOUNTER — Encounter: Payer: Self-pay | Admitting: Podiatry

## 2021-02-20 DIAGNOSIS — B353 Tinea pedis: Secondary | ICD-10-CM

## 2021-02-20 DIAGNOSIS — B351 Tinea unguium: Secondary | ICD-10-CM

## 2021-02-20 DIAGNOSIS — Z79899 Other long term (current) drug therapy: Secondary | ICD-10-CM

## 2021-02-20 NOTE — Patient Instructions (Signed)
Terbinafine oral granules What is this medicine? TERBINAFINE (TER bin a feen) is an antifungal medicine. It is used to treat certain kinds of fungal or yeast infections. This medicine may be used for other purposes; ask your health care provider or pharmacist if you have questions. COMMON BRAND NAME(S): Lamisil What should I tell my health care provider before I take this medicine? They need to know if you have any of these conditions:  drink alcoholic beverages  kidney disease  liver disease  an unusual or allergic reaction to Terbinafine, other medicines, foods, dyes, or preservatives  pregnant or trying to get pregnant  breast-feeding How should I use this medicine? Take this medicine by mouth. Follow the directions on the prescription label. Hold packet with cut line on top. Shake packet gently to settle contents. Tear packet open along cut line, or use scissors to cut across line. Carefully pour the entire contents of packet onto a spoonful of a soft food, such as pudding or other soft, non-acidic food such as mashed potatoes (do NOT use applesauce or a fruit-based food). If two packets are required for each dose, you may either sprinkle the content of both packets on one spoonful of non-acidic food, or sprinkle the contents of both packets on two spoonfuls of non-acidic food. Make sure that no granules remain in the packet. Swallow the mxiture of the food and granules without chewing. Take your medicine at regular intervals. Do not take it more often than directed. Take all of your medicine as directed even if you think you are better. Do not skip doses or stop your medicine early. Contact your pediatrician or health care professional regarding the use of this medicine in children. While this medicine may be prescribed for children as young as 4 years for selected conditions, precautions do apply. Overdosage: If you think you have taken too much of this medicine contact a poison control  center or emergency room at once. NOTE: This medicine is only for you. Do not share this medicine with others. What if I miss a dose? If you miss a dose, take it as soon as you can. If it is almost time for your next dose, take only that dose. Do not take double or extra doses. What may interact with this medicine? Do not take this medicine with any of the following medications:  thioridazine This medicine may also interact with the following medications:  beta-blockers  caffeine  cimetidine  cyclosporine  MAOIs like Carbex, Eldepryl, Marplan, Nardil, and Parnate  medicines for fungal infections like fluconazole and ketoconazole  medicines for irregular heartbeat like amiodarone, flecainide and propafenone  rifampin  SSRIs like citalopram, escitalopram, fluoxetine, fluvoxamine, paroxetine and sertraline  tricyclic antidepressants like amitriptyline, clomipramine, desipramine, imipramine, nortriptyline, and others  warfarin This list may not describe all possible interactions. Give your health care provider a list of all the medicines, herbs, non-prescription drugs, or dietary supplements you use. Also tell them if you smoke, drink alcohol, or use illegal drugs. Some items may interact with your medicine. What should I watch for while using this medicine? Your doctor may monitor your liver function. Tell your doctor right away if you have nausea or vomiting, loss of appetite, stomach pain on your right upper side, yellow skin, dark urine, light stools, or are over tired. This medicine may cause serious skin reactions. They can happen weeks to months after starting the medicine. Contact your health care provider right away if you notice fevers or flu-like symptoms   with a rash. The rash may be red or purple and then turn into blisters or peeling of the skin. Or, you might notice a red rash with swelling of the face, lips or lymph nodes in your neck or under your arms. You need to take  this medicine for 6 weeks or longer to cure the fungal infection. Take your medicine regularly for as long as your doctor or health care provider tells you to. What side effects may I notice from receiving this medicine? Side effects that you should report to your doctor or health care professional as soon as possible:  allergic reactions like skin rash or hives, swelling of the face, lips, or tongue  change in vision  dark urine  fever or infection  general ill feeling or flu-like symptoms  light-colored stools  loss of appetite, nausea  rash, fever, and swollen lymph nodes  redness, blistering, peeling or loosening of the skin, including inside the mouth  right upper belly pain  unusually weak or tired  yellowing of the eyes or skin Side effects that usually do not require medical attention (report to your doctor or health care professional if they continue or are bothersome):  changes in taste  diarrhea  hair loss  muscle or joint pain  stomach upset This list may not describe all possible side effects. Call your doctor for medical advice about side effects. You may report side effects to FDA at 1-800-FDA-1088. Where should I keep my medicine? Keep out of the reach of children. Store at room temperature between 15 and 30 degrees C (59 and 86 degrees F). Throw away any unused medicine after the expiration date. NOTE: This sheet is a summary. It may not cover all possible information. If you have questions about this medicine, talk to your doctor, pharmacist, or health care provider.  2021 Elsevier/Gold Standard (2019-01-23 15:35:11)  

## 2021-02-21 LAB — CBC WITH DIFFERENTIAL/PLATELET
Absolute Monocytes: 462 cells/uL (ref 200–950)
Basophils Absolute: 20 cells/uL (ref 0–200)
Basophils Relative: 0.3 %
Eosinophils Absolute: 228 cells/uL (ref 15–500)
Eosinophils Relative: 3.5 %
HCT: 43.3 % (ref 35.0–45.0)
Hemoglobin: 13.1 g/dL (ref 11.7–15.5)
Lymphs Abs: 1606 cells/uL (ref 850–3900)
MCH: 22.5 pg — ABNORMAL LOW (ref 27.0–33.0)
MCHC: 30.3 g/dL — ABNORMAL LOW (ref 32.0–36.0)
MCV: 74.3 fL — ABNORMAL LOW (ref 80.0–100.0)
MPV: 8.9 fL (ref 7.5–12.5)
Monocytes Relative: 7.1 %
Neutro Abs: 4186 cells/uL (ref 1500–7800)
Neutrophils Relative %: 64.4 %
Platelets: 404 10*3/uL — ABNORMAL HIGH (ref 140–400)
RBC: 5.83 10*6/uL — ABNORMAL HIGH (ref 3.80–5.10)
RDW: 15.2 % — ABNORMAL HIGH (ref 11.0–15.0)
Total Lymphocyte: 24.7 %
WBC: 6.5 10*3/uL (ref 3.8–10.8)

## 2021-02-21 LAB — HEPATIC FUNCTION PANEL
AG Ratio: 1.2 (calc) (ref 1.0–2.5)
ALT: 17 U/L (ref 6–29)
AST: 11 U/L (ref 10–30)
Albumin: 4.1 g/dL (ref 3.6–5.1)
Alkaline phosphatase (APISO): 102 U/L (ref 31–125)
Bilirubin, Direct: 0.1 mg/dL (ref 0.0–0.2)
Globulin: 3.3 g/dL (calc) (ref 1.9–3.7)
Indirect Bilirubin: 0.1 mg/dL (calc) — ABNORMAL LOW (ref 0.2–1.2)
Total Bilirubin: 0.2 mg/dL (ref 0.2–1.2)
Total Protein: 7.4 g/dL (ref 6.1–8.1)

## 2021-02-23 ENCOUNTER — Other Ambulatory Visit: Payer: Self-pay | Admitting: Podiatry

## 2021-02-23 MED ORDER — TERBINAFINE HCL 250 MG PO TABS: 250.0000 mg | ORAL_TABLET | Freq: Every day | ORAL | 0 refills | Status: DC

## 2021-02-24 NOTE — Progress Notes (Signed)
Subjective:   Patient ID: Brianna Gutierrez, female   DOB: 45 y.o.   MRN: [id number]   HPI 45 year old female presents the office with concerns of toenail fungus mostly in her fifth toes as well as big toes.  She does not get any significant pain in the nails and denies emergency drainage or any swelling.  She also states that she has a rash in the arch of her foot.  She is been trying hydrocortisone cream which stopped the itch but the rash does not go away.  She does have eczema.  Denies any open lesions.  She has no other concerns.   Review of Systems  All other systems reviewed and are negative.  Past Medical History:  Diagnosis Date  . Borderline diabetes   . Hypertension     Past Surgical History:  Procedure Laterality Date  . CESAREAN SECTION    . CHOLECYSTECTOMY N/A 01/14/2019   Procedure: LAPAROSCOPIC CHOLECYSTECTOMY WITH POSSIBLE  INTRAOPERATIVE CHOLANGIOGRAM;  Surgeon: Armandina Gemma, MD;  Location: [facility];  Service: General;  Laterality: N/A;  . DILITATION & CURRETTAGE/HYSTROSCOPY WITH HYDROTHERMAL ABLATION N/A 06/22/2016   Procedure: DILATATION & CURETTAGE/HYSTEROSCOPY WITH HYDROTHERMAL ABLATION;  Surgeon: Shelly Bombard, MD;  Location: [facility];  Service: Gynecology;  Laterality: N/A;  . TUBAL LIGATION       Current Outpatient Medications:  .  amLODipine (NORVASC) 10 MG tablet, Take 1 tablet (10 mg total) by mouth daily., Disp: 30 tablet, Rfl: 0 .  cetirizine (ZYRTEC) 10 MG tablet, Take 1 tablet (10 mg total) by mouth daily., Disp: 30 tablet, Rfl: 4 .  fluticasone (FLONASE) 50 MCG/ACT nasal spray, Place 1 spray into both nostrils daily., Disp: 9.9 mL, Rfl: 1 .  hydrocortisone 2.5 % ointment, Apply topically 2 (two) times daily., Disp: 20 g, Rfl: 1 .  hydrOXYzine (ATARAX/VISTARIL) 25 MG tablet, Take 1 tablet (25 mg total) by mouth at bedtime., Disp: 30 tablet, Rfl: 0 .  sertraline (ZOLOFT) 100 MG tablet, Take 1 tablet (100 mg total) by mouth daily., Disp: 30 tablet, Rfl: 0 .   terbinafine (LAMISIL) 250 MG tablet, Take 1 tablet (250 mg total) by mouth daily., Disp: 90 tablet, Rfl: 0  Allergies  Allergen Reactions  . Oxycodone-Acetaminophen Hives    Pt tolerates Hydrocodone  . Percocet [Oxycodone-Acetaminophen] Hives    Pt tolerates Hydrocodone  . Tramadol Other (See Comments)    Headache          Objective:  Physical Exam  General: AAO x3, NAD  Dermatological: Nails appear to be hypertrophic, dystrophic with yellow-brown discoloration mostly to the hallux as well as the pigmented nails.  Dry, peeling, erythematous, plantar aspect of foot likely consistent with tinea pedis although may be some component of eczema.  No open lesions.  No pustules.  Vascular: Dorsalis Pedis artery and Posterior Tibial artery pedal pulses are 2/4 bilateral with immedate capillary fill time. There is no pain with calf compression, swelling, warmth, erythema.   Neruologic: Grossly intact via light touch bilateral.   Musculoskeletal: No gross boney pedal deformities bilateral. No pain, crepitus, or limitation noted with foot and ankle range of motion bilateral. Muscular strength 5/5 in all groups tested bilateral.  Gait: Unassisted, Nonantalgic.       Assessment:   Onychomycosis with likely tinea pedis versus eczema     Plan:  -Treatment options discussed including all alternatives, risks, and complications -Etiology of symptoms were discussed -We discussed both oral, topical as well as alternative treatments.  After discussion she  was proceed with oral Lamisil.  We will check a CBC and LFT.  Discussed side effects, success rates.  She is already using steroid cream for the rash which is not improving hopefully the Lamisil will also help with this.  Trula Slade DPM

## 2021-02-26 DIAGNOSIS — A599 Trichomoniasis, unspecified: Secondary | ICD-10-CM

## 2021-02-26 HISTORY — DX: Trichomoniasis, unspecified: A59.9

## 2021-03-03 ENCOUNTER — Other Ambulatory Visit (HOSPITAL_COMMUNITY)
Admission: RE | Admit: 2021-03-03 | Discharge: 2021-03-03 | Disposition: A | Discharge status: Home / Self Care | Payer: BC Managed Care – PPO | Source: Ambulatory Visit | Attending: Nurse Practitioner | Admitting: Nurse Practitioner

## 2021-03-03 ENCOUNTER — Ambulatory Visit (INDEPENDENT_AMBULATORY_CARE_PROVIDER_SITE_OTHER): Payer: BC Managed Care – PPO | Admitting: Nurse Practitioner

## 2021-03-03 ENCOUNTER — Encounter: Payer: Self-pay | Admitting: Nurse Practitioner

## 2021-03-03 ENCOUNTER — Other Ambulatory Visit: Payer: Self-pay

## 2021-03-03 VITALS — BP 124/90 | HR 70 | Resp 16 | Ht 62.0 in | Wt 177.0 lb

## 2021-03-03 DIAGNOSIS — Z01419 Encounter for gynecological examination (general) (routine) without abnormal findings: Secondary | ICD-10-CM | POA: Insufficient documentation

## 2021-03-03 DIAGNOSIS — N852 Hypertrophy of uterus: Secondary | ICD-10-CM | POA: Diagnosis not present

## 2021-03-03 DIAGNOSIS — N946 Dysmenorrhea, unspecified: Secondary | ICD-10-CM | POA: Diagnosis not present

## 2021-03-03 NOTE — Progress Notes (Signed)
45 y.o. G35P4 Married Brianna Gutierrez here for annual exam.    Has PCP, manages HTN, was told she is pre-diabetes, had diabetes education already. 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 Uses ibuprofen and heating pad.   Denies vaginal irritation, no concerns of STD  Husband incarcerated for about 7 more years, has family support. 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 Children ages: 36, 83, 71 (deseased), 66.   Having difficulty with constipation. Will plan colonoscopy age 65.  Period Duration (Days): 3 Period Pattern: Regular Menstrual Flow: Light Menstrual Control: Maxi pad Dysmenorrhea: (!) Severe Dysmenorrhea Symptoms: Cramping Patient's last menstrual period was 02/05/2021 (exact date).            Sexually active: No.  The current method of family planning is tubal ligation.    Exercising: No.  exercise Smoker:  no  Health Maintenance: Pap:  11-13-16 neg HPV HR neg History of abnormal Pap:  Yes, repeat normal 30 years ago MMG: age 50 Colonoscopy:  none BMD:   none Gardasil:   Not done Covid-19: pfizer Hep C testing: not done   reports that she has been smoking cigarettes. She has been smoking about 0.10 packs per day. She has never used smokeless tobacco. She reports previous alcohol use. She reports that she does not use drugs.  Past Medical History:  Diagnosis Date  . Abnormal Pap smear of cervix   . Anxiety   . Borderline diabetes   . Depression   . Hypertension     Past Surgical History:  Procedure Laterality Date  . CESAREAN SECTION    . CHOLECYSTECTOMY N/A 01/14/2019   Procedure: LAPAROSCOPIC CHOLECYSTECTOMY WITH POSSIBLE  INTRAOPERATIVE CHOLANGIOGRAM;  Surgeon: Armandina Gemma, MD;  Location: [facility];  Service: General;  Laterality: N/A;  . DILITATION & CURRETTAGE/HYSTROSCOPY WITH HYDROTHERMAL ABLATION N/A 06/22/2016   Procedure: DILATATION & CURETTAGE/HYSTEROSCOPY WITH HYDROTHERMAL ABLATION;  Surgeon: Shelly Bombard, MD;   Location: [facility];  Service: Gynecology;  Laterality: N/A;  . TUBAL LIGATION      Current Outpatient Medications  Medication Sig Dispense Refill  . amLODipine (NORVASC) 10 MG tablet Take 1 tablet (10 mg total) by mouth daily. 30 tablet 0  . cetirizine (ZYRTEC) 10 MG tablet Take 1 tablet (10 mg total) by mouth daily. 30 tablet 4  . fluticasone (FLONASE) 50 MCG/ACT nasal spray Place 1 spray into both nostrils daily. 9.9 mL 1  . hydrocortisone 2.5 % ointment Apply topically 2 (two) times daily. 20 g 1  . hydrOXYzine (ATARAX/VISTARIL) 25 MG tablet Take 1 tablet (25 mg total) by mouth at bedtime. 30 tablet 0  . sertraline (ZOLOFT) 100 MG tablet Take 1 tablet (100 mg total) by mouth daily. 30 tablet 0  . terbinafine (LAMISIL) 250 MG tablet Take 1 tablet (250 mg total) by mouth daily. 90 tablet 0   No current facility-administered medications for this visit.    Family History  Problem Relation Age of Onset  . Hypertension Maternal Grandmother   . Diabetes Maternal Grandfather     Review of Systems  Constitutional: Negative.   HENT: Negative.   Eyes: Negative.   Respiratory: Negative.   Cardiovascular: Negative.   Gastrointestinal: Negative.   Endocrine: Negative.   Genitourinary: Negative.   Musculoskeletal: Negative.   Skin: Negative.   Allergic/Immunologic: Negative.   Neurological: Negative.   Hematological: Negative.   Psychiatric/Behavioral: Negative.     Exam:   BP 124/90   Pulse 70   Resp 16  Ht 5\' 2"  (1.575 m)   Wt 177 lb (80.3 kg)   LMP 02/05/2021 (Exact Date)   BMI 32.37 kg/m   Height: 5\' 2"  (157.5 cm)  General appearance: alert, cooperative and appears stated age, no acute distress Head: Normocephalic, without obvious abnormality Neck: no adenopathy, thyroid normal to inspection and palpation Lungs: clear to auscultation bilaterally Breasts: No axillary or supraclavicular adenopathy, Normal to palpation without dominant masses Heart: regular rate and  rhythm Abdomen: soft, non-tender; no masses,  no organomegaly Extremities: extremities normal, no edema Skin: No rashes or lesions Lymph nodes: Cervical, supraclavicular, and axillary nodes normal. No abnormal inguinal nodes palpated Neurologic: Grossly normal   Pelvic: External genitalia:  no lesions              Urethra:  normal appearing urethra with no masses, tenderness or lesions              Bartholins and Skenes: normal                 Vagina: normal appearing vagina, appropriate for age, normal appearing discharge, no lesions              Cervix: neg cervical motion tenderness, no visible lesions             Bimanual Exam:   Uterus:  enlarged, 12 week size weeks size, firm irregular              Adnexa: no mass, fullness, tenderness                 Brianna Gutierrez, CMA Chaperone was present for exam.  A:   Well woman exam - Plan: Cytology - PAP( [place])  Dysmenorrhea - Plan: US PELVIS TRANSVAGINAL NON-OB (TV ONLY)  Enlarged uterus - Plan: US PELVIS TRANSVAGINAL NON-OB (TV ONLY)   P:   Pap : today  Mammogram: Pt to schedule, information provided  Labs:n/a  Medications: continue Ibuprofen for cramping  Pelvic US scheduled, follow up with MD to discuss severe dysmenorrhea/US findings (last [place] 2017, hx ablation)

## 2021-03-03 NOTE — Patient Instructions (Addendum)
Uterine Fibroids  Uterine fibroids, also called leiomyomas, are noncancerous (benign) tumors that can grow in the uterus. They can cause heavy menstrual bleeding and pain. Fibroids may also grow in the fallopian tubes, cervix, or tissues (ligaments) near the uterus. You may have one or many fibroids. Fibroids vary in size, weight, and where they grow in the uterus. Some can become quite large. Most fibroids do not require medical treatment. What are the causes? The cause of this condition is not known. What increases the risk? You are more likely to develop this condition if you:  Are in your 30s or 40s and have not gone through menopause.  Have a family history of this condition.  Are of African American descent.  Started your menstrual period at age 102 or younger.  Have never given birth.  Are overweight or obese. What are the signs or symptoms? Many women do not have any symptoms. Symptoms of this condition may include:  Heavy menstrual bleeding.  Bleeding between menstrual periods.  Pain and pressure in the pelvic area, between your hip bones.  Pain during sex.  Bladder problems, such as needing to urinate right away or more often than usual.  Inability to have children (infertility).  Failure to carry pregnancy to term (miscarriage). How is this diagnosed? This condition may be diagnosed based on:  Your symptoms and medical history.  A physical exam.  A pelvic exam that includes feeling for any tumors.  Imaging tests, such as ultrasound or MRI. How is this treated? Treatment for this condition may include follow-up visits with your health care provider to monitor your fibroids for any changes. Other treatment may include:  Medicines, such as: ? Medicines to relieve pain, including aspirin and NSAIDs, such as ibuprofen or naproxen. ? Hormone therapy. Treatment may be given as a pill or an injection, or it may be inserted into the uterus using an intrauterine  device (IUD).  Surgery that would do one of the following: ? Remove the fibroids (myomectomy). This may be recommended if fibroids affect your fertility and you want to become pregnant. ? Remove the uterus (hysterectomy). ? Block the blood supply to the fibroids (uterine artery embolization). This can cause them to shrink and die. Follow these instructions at home: Medicines  Take over-the-counter and prescription medicines only as told by your health care provider.  Ask your health care provider if you should take iron pills or eat more iron-rich foods, such as dark green, leafy vegetables. Heavy menstrual bleeding can cause low iron levels. Managing pain If directed, apply heat to your back or abdomen to reduce pain. Use the heat source that your health care provider recommends, such as a moist heat pack or a heating pad. To apply heat:  Place a towel between your skin and the heat source.  Leave the heat on for 20-30 minutes.  Remove the heat if your skin turns bright red. This is especially important if you are unable to feel pain, heat, or cold. You may have a greater risk of getting burned.   General instructions  Pay close attention to your menstrual cycle. Tell your health care provider about any changes, such as: ? Heavier bleeding that requires you to change your pads or tampons more than usual. ? A change in the number of days that your menstrual period lasts. ? A change in symptoms that come with your menstrual period, such as back pain or cramps in your abdomen.  Keep all follow-up visits. This is  important, especially if your fibroids need to be monitored for any changes. Contact a health care provider if you:  Have pelvic pain, back pain, or cramps in your abdomen that do not get better with medicine or heat.  Develop new bleeding between menstrual periods.  Have increased bleeding during or between menstrual periods.  Feel more tired or weak than usual.  Feel  light-headed. Get help right away if you:  Faint.  Have pelvic pain that suddenly gets worse.  Have severe vaginal bleeding that soaks a tampon or pad in 30 minutes or less. Summary  Uterine fibroids are noncancerous (benign) tumors that can develop in the uterus.  The exact cause of this condition is not known.  Most fibroids do not require medical treatment unless they affect your ability to have children (fertility).  Contact a health care provider if you have pelvic pain, back pain, or cramps in your abdomen that do not get better with medicines.  Get help right away if you faint, have pelvic pain that suddenly gets worse, or have severe vaginal bleeding. This information is not intended to replace advice given to you by your health care provider. Make sure you discuss any questions you have with your health care provider. Document Revised: 05/17/2020 Document Reviewed: 05/17/2020 Elsevier Patient Education  2021 [employer] Health Maintenance, Female Adopting a healthy lifestyle and getting preventive care are important in promoting health and wellness. Ask your health care provider about:  The right schedule for you to have regular tests and exams.  Things you can do on your own to prevent diseases and keep yourself healthy. What should I know about diet, weight, and exercise? Eat a healthy diet  Eat a diet that includes plenty of vegetables, fruits, low-fat dairy products, and lean protein.  Do not eat a lot of foods that are high in solid fats, added sugars, or sodium.   Maintain a healthy weight Body mass index (BMI) is used to identify weight problems. It estimates body fat based on height and weight. Your health care provider can help determine your BMI and help you achieve or maintain a healthy weight. Get regular exercise Get regular exercise. This is one of the most important things you can do for your health. Most adults should:  Exercise for at least 150  minutes each week. The exercise should increase your heart rate and make you sweat (moderate-intensity exercise).  Do strengthening exercises at least twice a week. This is in addition to the moderate-intensity exercise.  Spend less time sitting. Even light physical activity can be beneficial. Watch cholesterol and blood lipids Have your blood tested for lipids and cholesterol at 45 years of age, then have this test every 5 years. Have your cholesterol levels checked more often if:  Your lipid or cholesterol levels are high.  You are older than 45 years of age.  You are at high risk for heart disease. What should I know about cancer screening? Depending on your health history and family history, you may need to have cancer screening at various ages. This may include screening for:  Breast cancer.  Cervical cancer.  Colorectal cancer.  Skin cancer.  Lung cancer. What should I know about heart disease, diabetes, and high blood pressure? Blood pressure and heart disease  High blood pressure causes heart disease and increases the risk of stroke. This is more likely to develop in people who have high blood pressure readings, are of African descent, or are overweight.  Have   your blood pressure checked: ? Every 3-5 years if you are 18-39 years of age. ? Every year if you are 40 years old or older. Diabetes Have regular diabetes screenings. This checks your fasting blood sugar level. Have the screening done:  Once every three years after age 40 if you are at a normal weight and have a low risk for diabetes.  More often and at a younger age if you are overweight or have a high risk for diabetes. What should I know about preventing infection? Hepatitis B If you have a higher risk for hepatitis B, you should be screened for this virus. Talk with your health care provider to find out if you are at risk for hepatitis B infection. Hepatitis C Testing is recommended for:  Everyone born  from 1945 through 1965.  Anyone with known risk factors for hepatitis C. Sexually transmitted infections (STIs)  Get screened for STIs, including gonorrhea and chlamydia, if: ? You are sexually active and are younger than 45 years of age. ? You are older than 45 years of age and your health care provider tells you that you are at risk for this type of infection. ? Your sexual activity has changed since you were last screened, and you are at increased risk for chlamydia or gonorrhea. Ask your health care provider if you are at risk.  Ask your health care provider about whether you are at high risk for HIV. Your health care provider may recommend a prescription medicine to help prevent HIV infection. If you choose to take medicine to prevent HIV, you should first get tested for HIV. You should then be tested every 3 months for as long as you are taking the medicine. Pregnancy  If you are about to stop having your period (premenopausal) and you may become pregnant, seek counseling before you get pregnant.  Take 400 to 800 micrograms (mcg) of folic acid every day if you become pregnant.  Ask for birth control (contraception) if you want to prevent pregnancy. Osteoporosis and menopause Osteoporosis is a disease in which the bones lose minerals and strength with aging. This can result in bone fractures. If you are 65 years old or older, or if you are at risk for osteoporosis and fractures, ask your health care provider if you should:  Be screened for bone loss.  Take a calcium or vitamin D supplement to lower your risk of fractures.  Be given hormone replacement therapy (HRT) to treat symptoms of menopause. Follow these instructions at home: Lifestyle  Do not use any products that contain nicotine or tobacco, such as cigarettes, e-cigarettes, and chewing tobacco. If you need help quitting, ask your health care provider.  Do not use street drugs.  Do not share needles.  Ask your health  care provider for help if you need support or information about quitting drugs. Alcohol use  Do not drink alcohol if: ? Your health care provider tells you not to drink. ? You are pregnant, may be pregnant, or are planning to become pregnant.  If you drink alcohol: ? Limit how much you use to 0-1 drink a day. ? Limit intake if you are breastfeeding.  Be aware of how much alcohol is in your drink. In the [place] one drink equals one 12 oz bottle of beer (355 mL), one 5 oz glass of wine (148 mL), or one 1 oz glass of hard liquor (44 mL). General instructions  Schedule regular health, dental, and eye exams.  Stay current   current with your vaccines.  Tell your health care provider if: ? You often feel depressed. ? You have ever been abused or do not feel safe at home. Summary  Adopting a healthy lifestyle and getting preventive care are important in promoting health and wellness.  Follow your health care provider's instructions about healthy diet, exercising, and getting tested or screened for diseases.  Follow your health care provider's instructions on monitoring your cholesterol and blood pressure. This information is not intended to replace advice given to you by your health care provider. Make sure you discuss any questions you have with your health care provider. Document Revised: 10/08/2018 Document Reviewed: 10/08/2018 Elsevier Patient Education  2021 [employer].   Dysmenorrhea Dysmenorrhea refers to cramps caused by the muscles of the uterus tightening (contracting) during a menstrual period. Dysmenorrhea may be mild, or it may be severe enough to interfere with everyday activities for a few days each month. Primary dysmenorrhea is menstrual cramps that last a couple of days when a female starts having menstrual periods or soon after. As a female gets older or has a baby, the cramps will usually lessen or disappear. Secondary dysmenorrhea begins later in life and is caused by a  disorder in the reproductive system. It lasts longer, and it may cause more pain than primary dysmenorrhea. The pain may start before the period and last a few days after the period. What are the causes? Dysmenorrhea is usually caused by an underlying problem, such as:  Endometriosis. The tissue that lines the uterus (endometrium) growing outside of the uterus in other areas of the body.  Adenomyosis. Endometrial tissue growing into the muscular walls of the uterus.  Pelvic congestive syndrome. Blood vessels in the pelvis that fill with blood just before the menstrual period.  Overgrowth of cells (polyps) in the endometrium or the lower part of the uterus (cervix).  Uterine prolapse. The uterus dropping down into the vagina due to stretched or weak muscles.  Bladder problems, such as infection or inflammation.  Intestinal problems, such as a tumor or irritable bowel syndrome.  Cancer of the reproductive organs or bladder. Other causes of this condition may result from:  A severely tipped uterus.  A cervix that is closed or has a small opening.  Noncancerous (benign) tumors in the uterus (fibroids).  Pelvic inflammatory disease (PID).  Pelvic scarring (adhesions) from a previous surgery.  An ovarian cyst.  An IUD (intrauterine device). What increases the risk? You are more likely to develop this condition if:  You are younger than 45 years old.  You started puberty early.  You have irregular or heavy bleeding.  You have never given birth.  You have a family history of dysmenorrhea.  You smoke or use nicotine products.  You have high body weight or a low body weight. What are the signs or symptoms? Symptoms of this condition include:  Cramping, throbbing pain in lower abdomen or lower back, or a feeling of fullness in the lower abdomen.  Periods lasting for longer than 7 days.  Headaches.  Bloating.  Fatigue.  Nausea or vomiting.  Diarrhea or loose  stools.  Sweating or dizziness. How is this diagnosed? This condition may be diagnosed based on:  Your symptoms.  Your medical history.  A physical exam.  Blood tests.  A Pap test. This is a test in which cells from the cervix are tested for signs of cancer or infection.  A pregnancy test. You may also have other tests, including:  Imaging tests, such as: ? Ultrasound. ? A procedure to remove and examine a sample of endometrial tissue (dilation and curettage, D&C). ? A procedure to visually examine the inside of:  The uterus (hysteroscopy).  The abdomen or pelvis (laparoscopy).  The bladder (cystoscopy). ? X-rays.  CT scan.  MRI. How is this treated? Treatment depends on the cause of the dysmenorrhea. Treatment may include medicines, such as:  Pain medicines.  Hormone replacement therapy. ? Injections of progesterone to stop the menstrual period. ? Birth control pills that contain the hormone progesterone. ? An IUD that contains the hormone progesterone.  NSAIDs, such as ibuprofen. These may help to stop the production of hormones that cause cramps.  Antidepressant medicines. Other treatment may include:  Surgery to remove adhesions, endometriosis, ovarian cysts, fibroids, or the entire uterus (hysterectomy).  Endometrial ablation. This is a procedure to destroy the endometrium.  Presacral neurectomy. This is a procedure to cut the nerves in the bottom of the spine (sacrum) that go to the reproductive organs.  Sacral nerve stimulation. This is a procedure to apply an electric current to nerves in the sacrum.  Exercise and physical therapy.  Meditation, yoga, and acupuncture. Work with your health care provider to determine what treatment or combination of treatments is best for you. Follow these instructions at home: Relieving pain and cramping  If directed, apply heat to your lower back or abdomen when you experience pain or cramps. Use the heat source  that your health care provider recommends, such as a moist heat pack or a heating pad. ? Place a towel between your skin and the heat source. ? Leave the heat on for 20-30 minutes. ? Remove the heat if your skin turns bright red. This is especially important if you are unable to feel pain, heat, or cold. You may have a greater risk of getting burned.  Do not sleep with a heating pad on.  Exercise. Activities such as walking, swimming, or biking can help to relieve cramps.  Massage your lower back or abdomen to help relieve pain.   General instructions  Take over-the-counter and prescription medicines only as told by your health care provider.  Ask your health care provider if the medicine prescribed to you requires you to avoid driving or using machinery.  Avoid alcohol and caffeine during and right before your period. These can make cramps worse.  Do not use any products that contain nicotine or tobacco. These products include cigarettes, chewing tobacco, and vaping devices, such as e-cigarettes. If you need help quitting, ask your health care provider.  Keep all follow-up visits. This is important. Contact a health care provider if:  You have pain that gets worse or does not get better with medicine.  You have pain with sex.  You develop nausea or vomiting with your period that is not controlled with medicine. Get help right away if:  You faint. Summary  Dysmenorrhea refers to cramps caused by the muscles of the uterus tightening (contracting) during a menstrual period.  Dysmenorrhea may be mild, or it may be severe enough to interfere with everyday activities for a few days each month.  Treatment depends on the cause of the dysmenorrhea.  Work with your health care provider to determine what treatment or combination of treatments is best for you. This information is not intended to replace advice given to you by your health care provider. Make sure you discuss any questions  you have with your health care provider. Document Revised:  06/01/2020 Document Reviewed: 06/01/2020 Elsevier Patient Education  [employer] [place].

## 2021-03-06 NOTE — Progress Notes (Signed)
Virtual Visit via Telephone Note  I connected with Brianna Gutierrez, on 03/07/2021 at 4:37 PM by telephone due to the COVID-19 pandemic and verified that I am speaking with the correct person using two identifiers.  Due to current restrictions/limitations of in-office visits due to the COVID-19 pandemic, this scheduled clinical appointment was converted to a telehealth visit.   Consent: I discussed the limitations, risks, security and privacy concerns of performing an evaluation and management service by telephone and the availability of in person appointments. I also discussed with the patient that there may be a patient responsible charge related to this service. The patient expressed understanding and agreed to proceed.  Location of Patient: Home  Location of Provider: [facility] [place] participating in Telemedicine visit: Alezandra Cabler Durene Fruits, NP Elmon Else, CMA  History of Present Illness: Brianna Gutierrez is a 45 year-old female who presents for hypertension and anxiety/depression follow-up.  1.HYPERTENSION FOLLOW-UP: 02/07/2021: - Continue Amlodipine as prescribed.   03/07/2021: Currently taking: see medication list Med Adherence: [x]  Yes    []  No Medication side effects: []  Yes    [x]  No Home Monitoring?: [x]  Yes    []  No Home BP results range: [x]  Yes, 120's/90's SOB? []  Yes    [x]  No Chest Pain?: []  Yes    [x]  No  2. ANXIETY/DEPRESSION FOLLOW-UP: 02/07/2021: - Increase Sertraline from 50 mg daily to 100 mg daily.  - Follow-up with primary provider in 2 to 4 weeks or sooner if needed.  03/07/2021: Doing well on Sertraline and without side effects. Denies thoughts of self-harm, suicidal ideations, and homicidal ideations.   Past Medical History:  Diagnosis Date  . Abnormal Pap smear of cervix   . Anxiety   . Borderline diabetes   . Depression   . Hypertension    Allergies  Allergen Reactions  . Oxycodone-Acetaminophen  Hives    Pt tolerates Hydrocodone  . Percocet [Oxycodone-Acetaminophen] Hives    Pt tolerates Hydrocodone  . Tramadol Other (See Comments)    Headache    Current Outpatient Medications on File Prior to Visit  Medication Sig Dispense Refill  . amLODipine (NORVASC) 10 MG tablet Take 1 tablet (10 mg total) by mouth daily. 30 tablet 0  . cetirizine (ZYRTEC) 10 MG tablet Take 1 tablet (10 mg total) by mouth daily. 30 tablet 4  . fluticasone (FLONASE) 50 MCG/ACT nasal spray Place 1 spray into both nostrils daily. 9.9 mL 1  . hydrocortisone 2.5 % ointment Apply topically 2 (two) times daily. 20 g 1  . hydrOXYzine (ATARAX/VISTARIL) 25 MG tablet Take 1 tablet (25 mg total) by mouth at bedtime. 30 tablet 0  . sertraline (ZOLOFT) 100 MG tablet Take 1 tablet (100 mg total) by mouth daily. 30 tablet 0  . terbinafine (LAMISIL) 250 MG tablet Take 1 tablet (250 mg total) by mouth daily. 90 tablet 0  . [DISCONTINUED] dicyclomine (BENTYL) 20 MG tablet Take 1 tablet (20 mg total) by mouth 2 (two) times daily. 20 tablet 0  . [DISCONTINUED] omeprazole (PRILOSEC) 20 MG capsule Take 1 capsule (20 mg total) by mouth daily. 30 capsule 1   No current facility-administered medications on file prior to visit.    Observations/Objective: Alert and oriented x 3. Not in acute distress. Physical examination not completed as this is a telemedicine visit.  Assessment and Plan: 1. Essential hypertension: - Home blood pressure readings at goal.  - Continue Amlodipine as prescribed.  - Counseled on blood pressure  goal of less than 130/80, low-sodium, DASH diet, medication compliance, 150 minutes of moderate intensity exercise per week as tolerated. Discussed medication compliance, adverse effects. - Follow-up with primary provider in 3 months or sooner if needed. - amLODipine (NORVASC) 10 MG tablet; Take 1 tablet (10 mg total) by mouth daily.  Dispense: 90 tablet; Refill: 0  2. Anxiety and depression: - Stable. -  Denies thoughts of self-harm, suicidal ideations, and homicidal ideations.  - Continue Sertraline as prescribed.  - Follow-up with primary provider in 3 months or sooner if needed. - sertraline (ZOLOFT) 100 MG tablet; Take 1 tablet (100 mg total) by mouth daily.  Dispense: 90 tablet; Refill: 0   Follow Up Instructions: Follow-up with primary provider in 3 months or sooner if needed.   Patient was given clear instructions to go to Emergency Department or return to medical center if symptoms don't improve, worsen, or new problems develop.The patient verbalized understanding.  I discussed the assessment and treatment plan with the patient. The patient was provided an opportunity to ask questions and all were answered. The patient agreed with the plan and demonstrated an understanding of the instructions.   The patient was advised to call back or seek an in-person evaluation if the symptoms worsen or if the condition fails to improve as anticipated.   I provided 10 minutes total of non-face-to-face time during this encounter.   [name], NP  [facility] [place], [place] 03/07/2021, 4:37 PM

## 2021-03-07 ENCOUNTER — Encounter: Payer: Self-pay | Admitting: Family

## 2021-03-07 ENCOUNTER — Telehealth (INDEPENDENT_AMBULATORY_CARE_PROVIDER_SITE_OTHER): Payer: BC Managed Care – PPO | Admitting: Family

## 2021-03-07 ENCOUNTER — Other Ambulatory Visit: Payer: Self-pay | Admitting: Nurse Practitioner

## 2021-03-07 ENCOUNTER — Other Ambulatory Visit: Payer: Self-pay

## 2021-03-07 DIAGNOSIS — F419 Anxiety disorder, unspecified: Secondary | ICD-10-CM | POA: Diagnosis not present

## 2021-03-07 DIAGNOSIS — F32A Depression, unspecified: Secondary | ICD-10-CM

## 2021-03-07 DIAGNOSIS — I10 Essential (primary) hypertension: Secondary | ICD-10-CM

## 2021-03-07 DIAGNOSIS — A599 Trichomoniasis, unspecified: Secondary | ICD-10-CM

## 2021-03-07 LAB — CYTOLOGY - PAP
Comment: NEGATIVE
Diagnosis: NEGATIVE
High risk HPV: NEGATIVE

## 2021-03-07 MED ORDER — METRONIDAZOLE 500 MG PO TABS: 500.0000 mg | ORAL_TABLET | Freq: Two times a day (BID) | ORAL | 0 refills | Status: DC

## 2021-03-07 MED ORDER — AMLODIPINE BESYLATE 10 MG PO TABS
10.0000 mg | ORAL_TABLET | Freq: Every day | ORAL | 0 refills | Status: DC
Start: 2021-03-07 — End: 2021-07-31

## 2021-03-07 MED ORDER — SERTRALINE HCL 100 MG PO TABS: 100.0000 mg | ORAL_TABLET | Freq: Every day | ORAL | 0 refills | Status: DC

## 2021-03-07 NOTE — Progress Notes (Signed)
Trichomonas on pap Rx sent: metronidazole 500mg  BID x 7 days

## 2021-03-07 NOTE — Progress Notes (Signed)
Please make 3 month rescreen appointment for trich

## 2021-03-07 NOTE — Progress Notes (Signed)
Hypertension f/u  Anxiety  Pt states doing fine, last BP read 124/90(03/03/21)

## 2021-03-14 ENCOUNTER — Other Ambulatory Visit: Payer: Self-pay | Admitting: Nurse Practitioner

## 2021-03-14 DIAGNOSIS — N946 Dysmenorrhea, unspecified: Secondary | ICD-10-CM

## 2021-03-14 DIAGNOSIS — D219 Benign neoplasm of connective and other soft tissue, unspecified: Secondary | ICD-10-CM

## 2021-03-14 DIAGNOSIS — N852 Hypertrophy of uterus: Secondary | ICD-10-CM

## 2021-03-21 ENCOUNTER — Other Ambulatory Visit: Payer: BC Managed Care – PPO | Admitting: Obstetrics and Gynecology

## 2021-03-21 ENCOUNTER — Other Ambulatory Visit: Payer: BC Managed Care – PPO

## 2021-04-03 ENCOUNTER — Ambulatory Visit (INDEPENDENT_AMBULATORY_CARE_PROVIDER_SITE_OTHER): Payer: BC Managed Care – PPO | Admitting: Podiatry

## 2021-04-03 ENCOUNTER — Other Ambulatory Visit: Payer: Self-pay

## 2021-04-03 DIAGNOSIS — B351 Tinea unguium: Secondary | ICD-10-CM

## 2021-04-03 DIAGNOSIS — Z79899 Other long term (current) drug therapy: Secondary | ICD-10-CM | POA: Diagnosis not present

## 2021-04-03 DIAGNOSIS — B353 Tinea pedis: Secondary | ICD-10-CM | POA: Diagnosis not present

## 2021-04-03 NOTE — Patient Instructions (Signed)
Terbinafine tablets What is this medicine? TERBINAFINE (TER bin a feen) is an antifungal medicine. It is used to treat certain kinds of fungal or yeast infections. This medicine may be used for other purposes; ask your health care provider or pharmacist if you have questions. COMMON BRAND NAME(S): Lamisil, Terbinex What should I tell my health care provider before I take this medicine? They need to know if you have any of these conditions:  drink alcoholic beverages  kidney disease  liver disease  an unusual or allergic reaction to terbinafine, other medicines, foods, dyes, or preservatives  pregnant or trying to get pregnant  breast-feeding How should I use this medicine? Take this medicine by mouth with a full glass of water. Follow the directions on the prescription label. You can take this medicine with food or on an empty stomach. Take your medicine at regular intervals. Do not take your medicine more often than directed. Do not skip doses or stop your medicine early even if you feel better. Do not stop taking except on your doctor's advice. A special MedGuide will be given to you by the pharmacist with each prescription and refill. Be sure to read this information carefully each time. Talk to your pediatrician regarding the use of this medicine in children. Special care may be needed. Overdosage: If you think you have taken too much of this medicine contact a poison control center or emergency room at once. NOTE: This medicine is only for you. Do not share this medicine with others. What if I miss a dose? If you miss a dose, take it as soon as you can. If it is almost time for your next dose, take only that dose. Do not take double or extra doses. What may interact with this medicine? Do not take this medicine with any of the following medications:  thioridazine This medicine may also interact with the following  medications:  beta-blockers  caffeine  cimetidine  cyclosporine  medicines for depression, anxiety, or psychotic disturbances  medicines for fungal infections like fluconazole and ketoconazole  medicines for irregular heartbeat like amiodarone, flecainide and propafenone  rifampin  warfarin This list may not describe all possible interactions. Give your health care provider a list of all the medicines, herbs, non-prescription drugs, or dietary supplements you use. Also tell them if you smoke, drink alcohol, or use illegal drugs. Some items may interact with your medicine. What should I watch for while using this medicine? Visit your doctor or health care provider regularly. Tell your doctor right away if you have nausea or vomiting, loss of appetite, stomach pain on your right upper side, yellow skin, dark urine, light stools, or are over tired. Some fungal infections need many weeks or months of treatment to cure. If you are taking this medicine for a long time, you will need to have important blood work done. This medicine may cause serious skin reactions. They can happen weeks to months after starting the medicine. Contact your health care provider right away if you notice fevers or flu-like symptoms with a rash. The rash may be red or purple and then turn into blisters or peeling of the skin. Or, you might notice a red rash with swelling of the face, lips or lymph nodes in your neck or under your arms. What side effects may I notice from receiving this medicine? Side effects that you should report to your doctor or health care professional as soon as possible:  allergic reactions like skin rash or hives,   swelling of the face, lips, or tongue  changes in vision  dark urine  fever or infection  general ill feeling or flu-like symptoms  light-colored stools  loss of appetite, nausea  rash, fever, and swollen lymph nodes  redness, blistering, peeling or loosening of the  skin, including inside the mouth  right upper belly pain  unusually weak or tired  yellowing of the eyes or skin Side effects that usually do not require medical attention (report to your doctor or health care professional if they continue or are bothersome):  changes in taste  diarrhea  hair loss  muscle or joint pain  stomach gas  stomach upset This list may not describe all possible side effects. Call your doctor for medical advice about side effects. You may report side effects to FDA at 1-800-FDA-1088. Where should I keep my medicine? Keep out of the reach of children. Store at room temperature below 25 degrees C (77 degrees F). Protect from light. Throw away any unused medicine after the expiration date. NOTE: This sheet is a summary. It may not cover all possible information. If you have questions about this medicine, talk to your doctor, pharmacist, or health care provider.  2021 Elsevier/Gold Standard (2019-01-23 15:37:07)  

## 2021-04-03 NOTE — Progress Notes (Signed)
Subjective: 45 year old female presents the office today for follow-up evaluation of nail fungus as well as for skin fungus on the right foot.  She is on the Lamisil she said that she is already noticing improvement.  She is on 6 weeks of the Lamisil without any side effects that she reports.  She has no new concerns today. Denies any systemic complaints such as fevers, chills, nausea, vomiting. No acute changes since last appointment, and no other complaints at this time.   Objective: AAO x3, NAD DP/PT pulses palpable bilaterally, CRT less than 3 seconds Is difficult to fully evaluate the nails as there is polyp in the nails.  Overall skin is much improvement particular on the right foot.  There is no pain in the nails there is no redness or drainage or any signs of infection. No pain with calf compression, swelling, warmth, erythema  Assessment: Onychomycosis, tinea pedis   Plan: -All treatment options discussed with the patient including all alternatives, risks, complications.  -She been doing much better with the Lamisil.  She is already noticed improvement.  We will continue this for full 90 days.  We will recheck a CBC and LFT in order was given today for this.  Once we finished the 90 days if needed we can do an additional 30 days but would need to recheck blood work prior to this.  If.  Discussed topical medication.  Also discussed external measures to help prevent fungus and reoccurrence. -Patient encouraged to call the office with any questions, concerns, change in symptoms.   Trula Slade DPM

## 2021-04-06 DIAGNOSIS — Z79899 Other long term (current) drug therapy: Secondary | ICD-10-CM | POA: Diagnosis not present

## 2021-04-06 LAB — HEPATIC FUNCTION PANEL
AG Ratio: 1.1 (calc) (ref 1.0–2.5)
ALT: 14 U/L (ref 6–29)
AST: 11 U/L (ref 10–30)
Albumin: 3.9 g/dL (ref 3.6–5.1)
Alkaline phosphatase (APISO): 101 U/L (ref 31–125)
Bilirubin, Direct: 0 mg/dL (ref 0.0–0.2)
Globulin: 3.5 g/dL (calc) (ref 1.9–3.7)
Indirect Bilirubin: 0.2 mg/dL (calc) (ref 0.2–1.2)
Total Bilirubin: 0.2 mg/dL (ref 0.2–1.2)
Total Protein: 7.4 g/dL (ref 6.1–8.1)

## 2021-04-06 LAB — CBC WITH DIFFERENTIAL/PLATELET
Absolute Monocytes: 536 cells/uL (ref 200–950)
Basophils Absolute: 20 cells/uL (ref 0–200)
Basophils Relative: 0.3 %
Eosinophils Absolute: 194 cells/uL (ref 15–500)
Eosinophils Relative: 2.9 %
HCT: 41.5 % (ref 35.0–45.0)
Hemoglobin: 12.8 g/dL (ref 11.7–15.5)
Lymphs Abs: 1581 cells/uL (ref 850–3900)
MCH: 22.9 pg — ABNORMAL LOW (ref 27.0–33.0)
MCHC: 30.8 g/dL — ABNORMAL LOW (ref 32.0–36.0)
MCV: 74.4 fL — ABNORMAL LOW (ref 80.0–100.0)
MPV: 9.3 fL (ref 7.5–12.5)
Monocytes Relative: 8 %
Neutro Abs: 4368 cells/uL (ref 1500–7800)
Neutrophils Relative %: 65.2 %
Platelets: 397 10*3/uL (ref 140–400)
RBC: 5.58 10*6/uL — ABNORMAL HIGH (ref 3.80–5.10)
RDW: 15.4 % — ABNORMAL HIGH (ref 11.0–15.0)
Total Lymphocyte: 23.6 %
WBC: 6.7 10*3/uL (ref 3.8–10.8)

## 2021-04-22 NOTE — Progress Notes (Signed)
Patient ID: Brianna Gutierrez, female    DOB: Oct 29, 1976  MRN: [id number]  CC: ECZEMA  Subjective: Brianna Gutierrez is a 45 y.o. female who presents for eczema.   Her concerns today include:   ECZEMA: Reports eczema of generalized body. Causing intense itching. Especially worse in bilateral palms of hands. Has tried Hydrocortisone cream without relief. Requesting referral to Dermatology.   2. ANXIETY DEPRESSION: Reports pharmacy is requesting refills be resent.  Patient Active Problem List   Diagnosis Date Noted   Cholelithiasis 01/13/2019   Pre-diabetes 11/15/2016   Uterine fibroid 04/27/2016   Abnormal uterine bleeding (AUB) 09/28/2015   History of bilateral tubal ligation 09/28/2015   Tobacco abuse 09/28/2015     Current Outpatient Medications on File Prior to Visit  Medication Sig Dispense Refill   amLODipine (NORVASC) 10 MG tablet Take 1 tablet (10 mg total) by mouth daily. 90 tablet 0   cetirizine (ZYRTEC) 10 MG tablet Take 1 tablet (10 mg total) by mouth daily. 30 tablet 4   fluticasone (FLONASE) 50 MCG/ACT nasal spray Place 1 spray into both nostrils daily. 9.9 mL 1   hydrocortisone 2.5 % ointment Apply topically 2 (two) times daily. 20 g 1   terbinafine (LAMISIL) 250 MG tablet Take 1 tablet (250 mg total) by mouth daily. 90 tablet 0   [DISCONTINUED] dicyclomine (BENTYL) 20 MG tablet Take 1 tablet (20 mg total) by mouth 2 (two) times daily. 20 tablet 0   [DISCONTINUED] omeprazole (PRILOSEC) 20 MG capsule Take 1 capsule (20 mg total) by mouth daily. 30 capsule 1   No current facility-administered medications on file prior to visit.    Allergies  Allergen Reactions   Oxycodone-Acetaminophen Hives    Pt tolerates Hydrocodone   Tramadol Other (See Comments)    Headache    Social History   Socioeconomic History   Marital status: Married    Spouse name: Not on file   Number of children: Not on file   Years of education: Not on file   Highest education level: Not on  file  Occupational History   Not on file  Tobacco Use   Smoking status: Every Day    Packs/day: 0.10    Pack years: 0.00    Types: Cigarettes    Last attempt to quit: 07/15/2014    Years since quitting: 6.7   Smokeless tobacco: Never  Vaping Use   Vaping Use: Never used  Substance and Sexual Activity   Alcohol use: Not Currently   Drug use: Never   Sexual activity: Not Currently    Partners: Male    Birth control/protection: Surgical    Comment: BTL  Other Topics Concern   Not on file  Social History Narrative   Not on file   Social Determinants of Health   Financial Resource Strain: Not on file  Food Insecurity: Not on file  Transportation Needs: Not on file  Physical Activity: Not on file  Stress: Not on file  Social Connections: Not on file  Intimate Partner Violence: Not on file    Family History  Problem Relation Age of Onset   Hypertension Maternal Grandmother    Diabetes Maternal Grandfather     Past Surgical History:  Procedure Laterality Date   CESAREAN SECTION     CHOLECYSTECTOMY N/A 01/14/2019   Procedure: LAPAROSCOPIC CHOLECYSTECTOMY WITH POSSIBLE  INTRAOPERATIVE CHOLANGIOGRAM;  Surgeon: Armandina Gemma, MD;  Location: [facility];  Service: General;  Laterality: N/A;   DILITATION & CURRETTAGE/HYSTROSCOPY WITH HYDROTHERMAL ABLATION  N/A 06/22/2016   Procedure: DILATATION & CURETTAGE/HYSTEROSCOPY WITH HYDROTHERMAL ABLATION;  Surgeon: Shelly Bombard, MD;  Location: [facility];  Service: Gynecology;  Laterality: N/A;   TUBAL LIGATION      ROS: Review of Systems Negative except as stated above  PHYSICAL EXAM: BP 119/83 (BP Location: Left Arm, Patient Position: Sitting, Cuff Size: Normal)   Pulse 85   Temp 98.1 F (36.7 C)   Resp 16   Ht 5' 2.13" (1.578 m)   Wt 180 lb 6.4 oz (81.8 kg)   SpO2 94%   BMI 32.86 kg/m   Physical Exam General appearance - alert, well appearing, and in no distress and oriented to person, place, and time Mental status - alert,  oriented to person, place, and time, normal mood, behavior, speech, dress, motor activity, and thought processes Neck - supple, no significant adenopathy Chest - clear to auscultation, no wheezes, rales or rhonchi, symmetric air entry, no tachypnea, retractions or cyanosis Heart - normal rate, regular rhythm, normal S1, S2, no murmurs, rubs, clicks or gallops Neurological - alert, oriented, normal speech, no focal findings or movement disorder noted   ASSESSMENT AND PLAN: 1. Eczema, unspecified type: - Per patient request referral to Dermatology for further evaluation and management.  - Ambulatory referral to Dermatology  2. Anxiety and depression: - Patient reports pharmacy is requesting refills of Sertraline be resent.  - Continue with current plan from 03/07/2021 visit.  - sertraline (ZOLOFT) 100 MG tablet; Take 1 tablet (100 mg total) by mouth daily.  Dispense: 90 tablet; Refill: 0   Patient was given the opportunity to ask questions.  Patient verbalized understanding of the plan and was able to repeat key elements of the plan. Patient was given clear instructions to go to Emergency Department or return to medical center if symptoms don't improve, worsen, or new problems develop.The patient verbalized understanding.   Orders Placed This Encounter  Procedures   Ambulatory referral to Dermatology     Requested Prescriptions   Signed Prescriptions Disp Refills   sertraline (ZOLOFT) 100 MG tablet 90 tablet 0    Sig: Take 1 tablet (100 mg total) by mouth daily.    Follow-up with primary provider as scheduled.   [name], NP

## 2021-04-24 ENCOUNTER — Other Ambulatory Visit: Payer: Self-pay

## 2021-04-24 ENCOUNTER — Encounter: Payer: Self-pay | Admitting: Family

## 2021-04-24 ENCOUNTER — Ambulatory Visit (INDEPENDENT_AMBULATORY_CARE_PROVIDER_SITE_OTHER): Payer: BC Managed Care – PPO | Admitting: Family

## 2021-04-24 VITALS — BP 119/83 | HR 85 | Temp 98.1°F | Resp 16 | Ht 62.13 in | Wt 180.4 lb

## 2021-04-24 DIAGNOSIS — L309 Dermatitis, unspecified: Secondary | ICD-10-CM | POA: Diagnosis not present

## 2021-04-24 DIAGNOSIS — F32A Depression, unspecified: Secondary | ICD-10-CM

## 2021-04-24 DIAGNOSIS — F419 Anxiety disorder, unspecified: Secondary | ICD-10-CM

## 2021-04-24 MED ORDER — SERTRALINE HCL 100 MG PO TABS: 100.0000 mg | ORAL_TABLET | Freq: Every day | ORAL | 0 refills | Status: DC

## 2021-04-24 NOTE — Progress Notes (Signed)
Pt presents for hypertension f/u states that she is doing fine Request referral to dermatology due to eczema flare, hydrocortisone cream not helping

## 2021-04-26 DIAGNOSIS — Z1231 Encounter for screening mammogram for malignant neoplasm of breast: Secondary | ICD-10-CM | POA: Diagnosis not present

## 2021-04-26 NOTE — Progress Notes (Signed)
GYNECOLOGY  VISIT   HPI: 45 y.o.   Married  [place] American  female   G4P0 with Patient's last menstrual period was 04/03/2021 (approximate).   here for pelvic ultrasound for dysmenorrhea.  Hx hydrothermal Ablation 2017 with Dr. Jodi Mourning.  Used Mirena prior to this, but it fell out.  Used Depo Provera in the past as well.  Cycles are not heavy but she has pain that is not controlled.  Takes Ibuprofen.  The first 2 days and the last day are crucial.  Has clots in the toilet. Able to work and sleep.   Recent dx of trichomonas on pap 03/03/21.  She was treated with Flagyl.  She will be retested on 06/08/21.   Also having constipation and gas and bloating.  Has hemorrhoids.  She is seeing her PCP who recommended a stool softener.  Still constipated.  Straining and seeing blood.   GYNECOLOGIC HISTORY: Patient's last menstrual period was 04/03/2021 (approximate). Contraception:  tubal Menopausal hormone therapy:  none Last mammogram:  normal age 29 Last pap smear: 03-03-21 Neg:Neg HR HPV, 11-13-16 Neg:Neg HR HPV        OB History     Gravida  4   Para      Term      Preterm      AB      Living  4      SAB      IAB      Ectopic      Multiple      Live Births  4              Patient Active Problem List   Diagnosis Date Noted   Cholelithiasis 01/13/2019   Pre-diabetes 11/15/2016   Uterine fibroid 04/27/2016   Abnormal uterine bleeding (AUB) 09/28/2015   History of bilateral tubal ligation 09/28/2015   Tobacco abuse 09/28/2015    Past Medical History:  Diagnosis Date   Abnormal Pap smear of cervix    Anxiety    Borderline diabetes    Depression    Hypertension    Trichomonas infection 02/2021    Past Surgical History:  Procedure Laterality Date   CESAREAN SECTION     CHOLECYSTECTOMY N/A 01/14/2019   Procedure: LAPAROSCOPIC CHOLECYSTECTOMY WITH POSSIBLE  INTRAOPERATIVE CHOLANGIOGRAM;  Surgeon: Armandina Gemma, MD;  Location: [facility];  Service:  General;  Laterality: N/A;   DILITATION & CURRETTAGE/HYSTROSCOPY WITH HYDROTHERMAL ABLATION N/A 06/22/2016   Procedure: DILATATION & CURETTAGE/HYSTEROSCOPY WITH HYDROTHERMAL ABLATION;  Surgeon: Shelly Bombard, MD;  Location: [facility];  Service: Gynecology;  Laterality: N/A;   TUBAL LIGATION      Current Outpatient Medications  Medication Sig Dispense Refill   amLODipine (NORVASC) 10 MG tablet Take 1 tablet (10 mg total) by mouth daily. 90 tablet 0   cetirizine (ZYRTEC) 10 MG tablet Take 1 tablet (10 mg total) by mouth daily. 30 tablet 4   hydrocortisone 2.5 % ointment Apply topically 2 (two) times daily. 20 g 1   sertraline (ZOLOFT) 100 MG tablet Take 1 tablet (100 mg total) by mouth daily. 90 tablet 0   terbinafine (LAMISIL) 250 MG tablet Take 1 tablet (250 mg total) by mouth daily. 90 tablet 0   No current facility-administered medications for this visit.     ALLERGIES: Oxycodone-acetaminophen and Tramadol  Family History  Problem Relation Age of Onset   Hypertension Maternal Grandmother    Diabetes Maternal Grandfather     Social History   Socioeconomic History  Marital status: Married    Spouse name: Not on file   Number of children: Not on file   Years of education: Not on file   Highest education level: Not on file  Occupational History   Not on file  Tobacco Use   Smoking status: Every Day    Packs/day: 0.10    Pack years: 0.00    Types: Cigarettes    Last attempt to quit: 07/15/2014    Years since quitting: 6.7   Smokeless tobacco: Never  Vaping Use   Vaping Use: Never used  Substance and Sexual Activity   Alcohol use: Not Currently   Drug use: Never   Sexual activity: Not Currently    Partners: Male    Birth control/protection: Surgical    Comment: BTL  Other Topics Concern   Not on file  Social History Narrative   Not on file   Social Determinants of Health   Financial Resource Strain: Not on file  Food Insecurity: Not on file  Transportation  Needs: Not on file  Physical Activity: Not on file  Stress: Not on file  Social Connections: Not on file  Intimate Partner Violence: Not on file    Review of Systems  All other systems reviewed and are negative.  PHYSICAL EXAMINATION:    BP 124/70 (Cuff Size: Large)   Pulse 76   Ht 5\' 2"  (1.575 m)   Wt 177 lb (80.3 kg)   LMP 04/03/2021 (Approximate)   SpO2 98%   BMI 32.37 kg/m     General appearance: alert, cooperative and appears stated age  Pelvic US Uterus with 4 fibroids:  2.27, 1.72, 4.83, and 4.01 cm.  EMS 6.49, distorted due to fibroids.  Ovaries normal but difficult visualization due to bowel.  No adnexal masses.  No free fluid.   ASSESSMENT  Dysmenorrhea.  Status post bilateral tubal ligation and endometrial ablation.  Fibroids.  HTN.  Smoker.  Recent trichomonas.    PLAN  Pelvic ultrasound images and report reviewed with patient. We discussed medical and surgical therapy for fibroids and dysmenorrhea. Depo Provera, Freida Busman, and Depo Lupron reviewed.  Surgical care with laparoscopic myomectomy and hysterectomy reviewed. I discussed hysterectomy as a more usual surgical treatment in the presence of prior tubal ligation and endometrial ablation. Risk of fibroid recurrence discussed.  Patient is interested in surgical treatment and is more comfortable with a uterine sparing procedure.  She would like to pursue laparoscopic myomectomy.  Will refer to Dr. Kerin Perna. She will return in August for a trichomonas recheck.    20 min total time was spent for this patient encounter, including preparation, face-to-face counseling with the patient, coordination of care, and documentation of the encounter.

## 2021-04-27 ENCOUNTER — Encounter: Payer: Self-pay | Admitting: Obstetrics and Gynecology

## 2021-04-27 ENCOUNTER — Ambulatory Visit (INDEPENDENT_AMBULATORY_CARE_PROVIDER_SITE_OTHER): Payer: BC Managed Care – PPO

## 2021-04-27 ENCOUNTER — Other Ambulatory Visit: Payer: Self-pay

## 2021-04-27 ENCOUNTER — Ambulatory Visit (INDEPENDENT_AMBULATORY_CARE_PROVIDER_SITE_OTHER): Payer: BC Managed Care – PPO | Admitting: Obstetrics and Gynecology

## 2021-04-27 VITALS — BP 124/70 | HR 76 | Ht 62.0 in | Wt 177.0 lb

## 2021-04-27 DIAGNOSIS — D219 Benign neoplasm of connective and other soft tissue, unspecified: Secondary | ICD-10-CM

## 2021-04-27 DIAGNOSIS — N946 Dysmenorrhea, unspecified: Secondary | ICD-10-CM

## 2021-04-27 DIAGNOSIS — N852 Hypertrophy of uterus: Secondary | ICD-10-CM | POA: Diagnosis not present

## 2021-04-29 ENCOUNTER — Encounter: Payer: Self-pay | Admitting: Obstetrics and Gynecology

## 2021-04-29 ENCOUNTER — Telehealth: Payer: Self-pay | Admitting: Obstetrics and Gynecology

## 2021-04-29 NOTE — Telephone Encounter (Signed)
Please make a referral to Dr. Kerin Perna for uterine fibroids and possible laparoscopic myomectomy.

## 2021-04-29 NOTE — Patient Instructions (Signed)
Myomectomy  Myomectomy is a surgery to remove a noncancerous fibroid (myoma) from the uterus. Myomas are tumors made up of fibrous tissue. They are often called fibroid tumors. They can range from the size of a pea to the size of agrapefruit. In a myomectomy, the fibroid tumor is removed without removing the uterus. This surgery is usually done if the tumor is growing or causing symptoms, such aspain, pressure, bleeding, pain during intercourse, or problems getting pregnant. Tell a health care provider about: Any allergies you have. All medicines you are taking, including vitamins, herbs, eye drops, creams, and over-the-counter medicines. Any problems you or family members have had with anesthetic medicines. Any blood disorders you have. Any surgeries you have had. Any medical conditions you have. Whether you are pregnant or may be pregnant. What are the risks? Generally, this is a safe procedure. However, problems may occur, including: Bleeding. Infection. Allergic reactions to medicines. Damage to nearby structures or organs. Blood clots in the legs, chest, or brain. Scar tissue on nearby organs and in the pelvis. This may require another surgery to remove the scar tissue. What happens before the procedure? Staying hydrated Follow instructions from your health care provider about hydration, which may include: Up to 2 hours before the procedure - you may continue to drink clear liquids, such as water, clear fruit juice, black coffee, and plain tea.  Eating and drinking restrictions Follow instructions from your health care provider about eating and drinking, which may include: 8 hours before the procedure - stop eating heavy meals or foods, such as meat, fried foods, or fatty foods. 6 hours before the procedure - stop eating light meals or foods, such as toast or cereal. 6 hours before the procedure - stop drinking milk or drinks that contain milk. 2 hours before the procedure - stop  drinking clear liquids. Medicines Ask your health care provider about: Changing or stopping your regular medicines. This is especially important if you are taking diabetes medicines or blood thinners. Taking medicines such as aspirin and ibuprofen. These medicines can thin your blood. Do not take these medicines unless your health care provider tells you to take them. Taking over-the-counter medicines, vitamins, herbs, and supplements. General instructions Do not use any products that contain nicotine or tobacco for at least 4 weeks before the procedure. These products include cigarettes, chewing tobacco, and vaping devices, such as e-cigarettes. If you need help quitting, ask your health care provider. Plan to have a responsible adult take you home from the hospital or clinic. Plan to have a responsible adult care for you for the time you are told after you leave the hospital or clinic. This is important. Ask your health care provider what steps will be taken to help prevent infection. These steps may include: Removing hair at the surgery site. Washing skin with a germ-killing soap. Taking antibiotic medicine. What happens during the procedure? An IV will be inserted into one of your veins. You will be given one or more of the following: A medicine to help you relax (sedative). A medicine to make you fall asleep (general anesthetic). A small, thin tube (catheter) will be inserted into your bladder to drain urine. One of the following methods will be used to remove the fibroid. The method used will depend on the size, shape, location, and number of fibroids. Hysteroscopic myomectomy. This may be done when the fibroid tumor is inside the cavity of the uterus. A long, thin tube with a lens (hysteroscope) will be  inserted into the uterus through the vagina. A salt and water solution called saline will be put into the uterus. This will expand the uterus and allow the surgeon to see the fibroids.  Tools will be passed through the hysteroscope to remove the fibroid tumor in pieces. Laparoscopic myomectomy. A few small incisions will be made in the lower abdomen. A thin, lighted tube with a camera (laparoscope) will be inserted through one of the incisions. This will give the surgeon a good view of the area. The fibroid tumor will be removed through the other incisions. The incisions will then be closed with stitches (sutures) or staples. Abdominal myomectomy. This is done when the fibroid tumor cannot be removed with a hysteroscope or laparoscope. The fibroid tumor will be removed through a larger incision that is made in the abdomen. The incision will be closed with sutures or staples. Recovery time will be longer with this method. These procedures may vary among health care providers and hospitals. What can I expect after the procedure? Your blood pressure, heart rate, breathing rate, and blood oxygen level will be monitored until you leave the hospital or clinic. The IV and catheter will remain inserted for a period of time. You may be given medicine for pain as needed. You may be given an antibiotic medicine if needed. If you were given a sedative during the procedure, it can affect you for several hours. Do not drive or operate machinery until your health care provider says that it is safe. Summary Myomectomy is a surgery to remove a noncancerous fibroid (myoma) from the uterus. This surgery is usually done if the tumor is growing or causing symptoms, such as pain, pressure, bleeding, pain during intercourse, or problems getting pregnant. Follow instructions from your health care provider about eating and drinking before the procedure. Recovery time depends on the method used in this procedure. The abdominal method will require a longer recovery. This information is not intended to replace advice given to you by your health care provider. Make sure you discuss any questions you have with  your healthcare provider. Document Revised: 05/19/2020 Document Reviewed: 05/19/2020 Elsevier Patient Education  [place].

## 2021-05-02 NOTE — Telephone Encounter (Signed)
Office notes faxed to [facility] they will call to schedule. Left detailed message on cell phone per DPR access this has been done and left office [phone] to call if she does not hear from this office.

## 2021-05-18 ENCOUNTER — Telehealth: Payer: Self-pay | Admitting: Family

## 2021-05-18 NOTE — Telephone Encounter (Signed)
Requesting to speak to PCP, Pt made aware she's not in clinic this day. Pt asked to speak to nurse about treatment for Hemmrhoids. Pt aware nurse is in clinic but msg would be sent. Thank you

## 2021-05-26 DIAGNOSIS — N9985 Post endometrial ablation syndrome: Secondary | ICD-10-CM | POA: Diagnosis not present

## 2021-05-26 DIAGNOSIS — D251 Intramural leiomyoma of uterus: Secondary | ICD-10-CM | POA: Diagnosis not present

## 2021-05-26 NOTE — Telephone Encounter (Signed)
Called patient and LVM to call the office so we can get her scheduled.

## 2021-06-05 NOTE — Progress Notes (Signed)
GYNECOLOGY  VISIT   HPI: 45 y.o.   Married  [place] American  female   G4P0 with Patient's last menstrual period was 05/09/2021 (approximate).   here for 3 month test of cure of trichomonas. Trichomonas was noted on her pap smear.  She took Flagyl.  She has not completed STD testing.   Having some anal discomfort.  Wondering is she has a hemorrhoid.  She feels a lot of pressure, like a pulsation.  Taking [employer] with estradiol and norethindrone acetate) from Dr. Robbie Louis for her fibroids and heavy menstrual bleeding.   GYNECOLOGIC HISTORY: Patient's last menstrual period was 05/09/2021 (approximate). Contraception:  Tubal Menopausal hormone therapy: none Last mammogram: 04/2021 normal per patient--[facility] Last pap smear:  03-03-21 Neg:Neg HR HPV, 11-13-16 Neg:Neg HR HPV        OB History     Gravida  4   Para      Term      Preterm      AB      Living  4      SAB      IAB      Ectopic      Multiple      Live Births  4              Patient Active Problem List   Diagnosis Date Noted   Cholelithiasis 01/13/2019   Pre-diabetes 11/15/2016   Uterine fibroid 04/27/2016   Abnormal uterine bleeding (AUB) 09/28/2015   History of bilateral tubal ligation 09/28/2015   Tobacco abuse 09/28/2015    Past Medical History:  Diagnosis Date   Abnormal Pap smear of cervix    Anxiety    Borderline diabetes    Depression    Hypertension    Trichomonas infection 02/2021    Past Surgical History:  Procedure Laterality Date   CESAREAN SECTION     CHOLECYSTECTOMY N/A 01/14/2019   Procedure: LAPAROSCOPIC CHOLECYSTECTOMY WITH POSSIBLE  INTRAOPERATIVE CHOLANGIOGRAM;  Surgeon: Armandina Gemma, MD;  Location: [facility];  Service: General;  Laterality: N/A;   DILITATION & CURRETTAGE/HYSTROSCOPY WITH HYDROTHERMAL ABLATION N/A 06/22/2016   Procedure: DILATATION & CURETTAGE/HYSTEROSCOPY WITH HYDROTHERMAL ABLATION;  Surgeon: Shelly Bombard, MD;  Location: [facility];  Service:  Gynecology;  Laterality: N/A;   TUBAL LIGATION      Current Outpatient Medications  Medication Sig Dispense Refill   cetirizine (ZYRTEC) 10 MG tablet Take 1 tablet (10 mg total) by mouth daily. 30 tablet 4   ORIAHNN 300-1-0.5 & 300 MG CPPK Take by mouth.     sertraline (ZOLOFT) 100 MG tablet Take 1 tablet (100 mg total) by mouth daily. 90 tablet 0   amLODipine (NORVASC) 10 MG tablet Take 1 tablet (10 mg total) by mouth daily. 90 tablet 0   No current facility-administered medications for this visit.     ALLERGIES: Oxycodone-acetaminophen and Tramadol  Family History  Problem Relation Age of Onset   Hypertension Maternal Grandmother    Diabetes Maternal Grandfather     Social History   Socioeconomic History   Marital status: Married    Spouse name: Not on file   Number of children: Not on file   Years of education: Not on file   Highest education level: Not on file  Occupational History   Not on file  Tobacco Use   Smoking status: Every Day    Packs/day: 0.10    Types: Cigarettes    Last attempt to quit: 07/15/2014    Years since quitting:  6.9   Smokeless tobacco: Never  Vaping Use   Vaping Use: Never used  Substance and Sexual Activity   Alcohol use: Not Currently   Drug use: Never   Sexual activity: Not Currently    Partners: Male    Birth control/protection: Surgical    Comment: BTL  Other Topics Concern   Not on file  Social History Narrative   Not on file   Social Determinants of Health   Financial Resource Strain: Not on file  Food Insecurity: Not on file  Transportation Needs: Not on file  Physical Activity: Not on file  Stress: Not on file  Social Connections: Not on file  Intimate Partner Violence: Not on file    Review of Systems  All other systems reviewed and are negative.  PHYSICAL EXAMINATION:    BP 130/84   Pulse 82   Ht '5\' 2"'$  (1.575 m)   Wt 177 lb (80.3 kg)   LMP 05/09/2021 (Approximate)   SpO2 98%   BMI 32.37 kg/m     General  appearance: alert, cooperative and appears stated age  Pelvic: External genitalia:  no lesions              Urethra:  normal appearing urethra with no masses, tenderness or lesions              Bartholins and Skenes: normal                 Vagina: normal appearing vagina with normal color and discharge, no lesions              Cervix: no lesions.  Menstrual bleeding.                 Bimanual Exam:  Uterus:  normal size, contour, position, consistency, mobility, non-tender              Adnexa: no mass, fullness, tenderness              Rectal exam: yes.  Confirms.  Hemorrhoid noted.               Anus:  normal sphincter tone, no lesions  Chaperone was present for exam:  Estill Bamberg, CMA  ASSESSMENT  Trichomonas infection.  STD screening.  Rectal bleeding. Uterine fibroids.  Status post endometrial ablation.  PLAN  STD testing.  We did the cervical/vaginal swab today but she will need to return for serum STD screening because the lab is already closed for the day. Referral to GI.  She may need a colonoscopy.    An After Visit Summary was printed and given to the patient.

## 2021-06-08 ENCOUNTER — Other Ambulatory Visit: Payer: Self-pay

## 2021-06-08 ENCOUNTER — Ambulatory Visit (INDEPENDENT_AMBULATORY_CARE_PROVIDER_SITE_OTHER): Payer: BC Managed Care – PPO | Admitting: Obstetrics and Gynecology

## 2021-06-08 ENCOUNTER — Other Ambulatory Visit (HOSPITAL_COMMUNITY)
Admission: RE | Admit: 2021-06-08 | Discharge: 2021-06-08 | Disposition: A | Discharge status: Home / Self Care | Payer: BC Managed Care – PPO | Source: Ambulatory Visit | Attending: Nurse Practitioner | Admitting: Nurse Practitioner

## 2021-06-08 VITALS — BP 130/84 | HR 82 | Ht 62.0 in | Wt 177.0 lb

## 2021-06-08 DIAGNOSIS — K625 Hemorrhage of anus and rectum: Secondary | ICD-10-CM

## 2021-06-08 DIAGNOSIS — Z113 Encounter for screening for infections with a predominantly sexual mode of transmission: Secondary | ICD-10-CM | POA: Diagnosis not present

## 2021-06-08 DIAGNOSIS — A599 Trichomoniasis, unspecified: Secondary | ICD-10-CM

## 2021-06-09 ENCOUNTER — Ambulatory Visit: Payer: BC Managed Care – PPO | Admitting: Nurse Practitioner

## 2021-06-09 DIAGNOSIS — L503 Dermatographic urticaria: Secondary | ICD-10-CM | POA: Diagnosis not present

## 2021-06-09 DIAGNOSIS — L2084 Intrinsic (allergic) eczema: Secondary | ICD-10-CM | POA: Diagnosis not present

## 2021-06-09 LAB — CERVICOVAGINAL ANCILLARY ONLY
Chlamydia: NEGATIVE
Comment: NEGATIVE
Comment: NEGATIVE
Comment: NORMAL
Neisseria Gonorrhea: NEGATIVE
Trichomonas: NEGATIVE

## 2021-06-11 ENCOUNTER — Encounter: Payer: Self-pay | Admitting: Obstetrics and Gynecology

## 2021-06-13 ENCOUNTER — Encounter: Payer: Self-pay | Admitting: Physician Assistant

## 2021-06-21 ENCOUNTER — Telehealth: Payer: Self-pay

## 2021-06-21 NOTE — Telephone Encounter (Signed)
Patient was referred to [facility]. She called today to relay that she is not happy with the medication Barbette Merino and another one) that was prescribed for her due to cost $900/ins does not cover and that there is risk with smoking and she is a smoker. I told her she should be contacting Dr. Charlett Lango office and making them aware. She said she called and told them recently and has not heard back from their office yet.  She said she was hoping that Dr. Darreld Mclean would do lap myomectomy but instead he wants to try medication.  She is asking is she a candidate for uterine embolization.  I recommended she make a visit and come and talk with Dr. Quincy Simmonds.  She agreed but asked me to go ahead and send this message.  Appt is scheduled 07/06/21.

## 2021-06-23 NOTE — Telephone Encounter (Signed)
Patient informed with the below note.

## 2021-06-23 NOTE — Telephone Encounter (Signed)
Uterine artery embolization treats heavy menstrual bleeding due to fibroids.  It does not treat pain.   I understand Dr. Kerin Perna did not recommend myomectomy because he did not believed it would treat both bleeding and pain.  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   Medical therapy for bleeding and pain could be Depo Provera.   Surgical therapy for bleeding and pain could be hysterectomy.

## 2021-07-02 ENCOUNTER — Encounter: Payer: Self-pay | Admitting: Obstetrics and Gynecology

## 2021-07-05 ENCOUNTER — Other Ambulatory Visit: Payer: Self-pay | Admitting: Obstetrics and Gynecology

## 2021-07-05 DIAGNOSIS — L509 Urticaria, unspecified: Secondary | ICD-10-CM | POA: Diagnosis not present

## 2021-07-05 DIAGNOSIS — N920 Excessive and frequent menstruation with regular cycle: Secondary | ICD-10-CM

## 2021-07-05 NOTE — Telephone Encounter (Signed)
I would prefer an in office visit.  We may need to do blood work to check for anemia.

## 2021-07-06 ENCOUNTER — Other Ambulatory Visit: Payer: Self-pay

## 2021-07-06 ENCOUNTER — Ambulatory Visit (INDEPENDENT_AMBULATORY_CARE_PROVIDER_SITE_OTHER): Payer: BC Managed Care – PPO | Admitting: Obstetrics and Gynecology

## 2021-07-06 ENCOUNTER — Encounter: Payer: Self-pay | Admitting: Obstetrics and Gynecology

## 2021-07-06 VITALS — BP 124/82 | HR 90 | Ht 62.0 in

## 2021-07-06 DIAGNOSIS — N946 Dysmenorrhea, unspecified: Secondary | ICD-10-CM

## 2021-07-06 DIAGNOSIS — D219 Benign neoplasm of connective and other soft tissue, unspecified: Secondary | ICD-10-CM | POA: Diagnosis not present

## 2021-07-06 DIAGNOSIS — N920 Excessive and frequent menstruation with regular cycle: Secondary | ICD-10-CM | POA: Diagnosis not present

## 2021-07-06 DIAGNOSIS — Z113 Encounter for screening for infections with a predominantly sexual mode of transmission: Secondary | ICD-10-CM | POA: Diagnosis not present

## 2021-07-06 DIAGNOSIS — N3946 Mixed incontinence: Secondary | ICD-10-CM | POA: Diagnosis not present

## 2021-07-06 NOTE — Patient Instructions (Signed)
Total Laparoscopic Hysterectomy A total laparoscopic hysterectomy is a minimally invasive surgery to remove the uterus and cervix. The fallopian tubes and ovaries can also be removed during this surgery, if necessary. This procedure may be done to treat problems such as: Growths in the uterus (uterine fibroids) that are not cancer but cause symptoms. A condition that causes the lining of the uterus to grow in other areas (endometriosis). Problems with pelvic support. Cancer of the cervix, ovaries, uterus, or tissue that lines the uterus (endometrium). Excessive bleeding in the uterus. After this procedure, you will no longer be able to have a baby, and you will no longer have a menstrual period. Tell a health care provider about: Any allergies you have. All medicines you are taking, including vitamins, herbs, eye drops, creams, and over-the-counter medicines. Any problems you or family members have had with anesthetic medicines. Any blood disorders you have. Any surgeries you have had. Any medical conditions you have. Whether you are pregnant or may be pregnant. What are the risks? Generally, this is a safe procedure. However, problems may occur, including: Infection. Bleeding. Blood clots in the legs or lungs. Allergic reactions to medicines. Damage to nearby structures or organs. Having to change from this surgery to one in which a large incision is made in the abdomen (abdominal hysterectomy). What happens before the procedure? Staying hydrated Follow instructions from your health care provider about hydration, which may include: Up to 2 hours before the procedure - you may continue to drink clear liquids, such as water, clear fruit juice, black coffee, and plain tea.  Eating and drinking restrictions Follow instructions from your health care provider about eating and drinking, which may include: 8 hours before the procedure - stop eating heavy meals or foods, such as meat, fried  foods, or fatty foods. 6 hours before the procedure - stop eating light meals or foods, such as toast or cereal. 6 hours before the procedure - stop drinking milk or drinks that contain milk. 2 hours before the procedure - stop drinking clear liquids. Medicines Ask your health care provider about: Changing or stopping your regular medicines. This is especially important if you are taking diabetes medicines or blood thinners. Taking medicines such as aspirin and ibuprofen. These medicines can thin your blood. Do not take these medicines unless your health care provider tells you to take them. Taking over-the-counter medicines, vitamins, herbs, and supplements. You may be asked to take medicine that helps you have a bowel movement (laxative) to prevent constipation. General instructions If you were asked to do bowel preparation before the procedure, follow instructions from your health care provider. This procedure can affect the way you feel about yourself. Talk with your health care provider about the physical and emotional changes hysterectomy may cause. Do not use any products that contain nicotine or tobacco for at least 4 weeks before the procedure. These products include cigarettes, chewing tobacco, and vaping devices, such as e-cigarettes. If you need help quitting, ask your health care provider. Plan to have a responsible adult take you home from the hospital or clinic. Plan to have a responsible adult care for you for the time you are told after you leave the hospital or clinic. This is important. Surgery safety Ask your health care provider: How your surgery site will be marked. What steps will be taken to help prevent infection. These may include: Removing hair at the surgery site. Washing skin with a germ-killing soap. Receiving antibiotic medicine. What happens during the  procedure? An IV will be inserted into one of your veins. You will be given one or more of the following: A  medicine to help you relax (sedative). A medicine to make you fall asleep (general anesthetic). A medicine to numb the area (local anesthetic). A medicine that is injected into your spine to numb the area below and slightly above the injection site (spinal anesthetic). A medicine that is injected into an area of your body to numb everything below the injection site (regional anesthetic). A gas will be used to inflate your abdomen. This will allow your surgeon to look inside your abdomen and do the surgery. Three or four small incisions will be made in your abdomen. A small device with a light (laparoscope) will be inserted into one of your incisions. Surgical instruments will be inserted through the other incisions in order to perform the procedure. Your uterus and cervix may be removed through your vagina or cut into small pieces and removed through the small incisions. Any other organs that need to be removed will also be removed this way. The gas will be released from inside your abdomen. Your incisions will be closed with stitches (sutures), skin glue, or adhesive strips. A bandage (dressing) may be placed over your incisions. The procedure may vary among health care providers and hospitals. What happens after the procedure? Your blood pressure, heart rate, breathing rate, and blood oxygen level will be monitored until you leave the hospital or clinic. You will be given medicine for pain as needed. You will be encouraged to walk as soon as possible. You will also use a device to help you breathe or do breathing exercises to keep your lungs clear. You may have to wear compression stockings. These stockings help to prevent blood clots and reduce swelling in your legs. You will need to wear a sanitary pad for vaginal discharge or bleeding. Summary Total laparoscopic hysterectomy is a procedure to remove your uterus, cervix, and sometimes the fallopian tubes and ovaries. This procedure can  affect the way you feel about yourself. Talk with your health care provider about the physical and emotional changes hysterectomy may cause. After this procedure, you will no longer be able to have a baby, and you will no longer have a menstrual period. You will be given pain medicine to control discomfort after this procedure. Plan to have a responsible adult take you home from the hospital or clinic. This information is not intended to replace advice given to you by your health care provider. Make sure you discuss any questions you have with your health care provider. Document Revised: 06/17/2020 Document Reviewed: 06/17/2020 Elsevier Patient Education  2022 [employer].  Urodynamic Testing Urodynamic tests are done to determine how well your lower urinary tract is working. The lower urinary tract includes your bladder and the part of your body that drains urine from the bladder (urethra). When your kidneys filter your blood, urine is stored in your bladder until you feel the urge to urinate. Urination requires coordination between the nerves and muscles of your bladder and urethra. When your lower urinary tract is working well, you should be able to: Start urinating when your bladder is full. Empty your bladder completely. Control the flow of your urine. Why do I need urodynamic testing? You may need urodynamic testing to help find the cause of any of these problems: Leaking urine (incontinence). Problems starting or stopping your urine flow. Frequent or painful urination. Frequent urinary tract infections. Being unable to  empty your bladder completely. Having strong urges to pass urine (urgency). Having a weak flow of urine. What are the risks? Generally, these tests are safe. However, some of the tests have risks, including: Discomfort. Frequent urge to urinate. Bleeding. Infection. Allergic reactions to medicines or dyes (contrast material). What happens before the test? Ask  your health care provider about changing or stopping your regular medicines. This is especially important if you are taking diabetes medicines or blood thinners. You may be asked to avoid urinating before coming to the test so that you arrive with a full bladder. Tell a health care provider about: Any allergies you have. All medicines you are taking, including vitamins, herbs, eye drops, creams, and over-the-counter medicines. Whether you are pregnant or may be pregnant. What happens during the test? You may have various urodynamic tests. The tests may be done separately or may all be done during one visit. You may be given an antibiotic medicine before or after testing to help prevent infection. The types of tests that may be done include: Uroflowmetry This test measures how much urine you pass and how long it takes to pass. You will urinate into a certain type of toilet or device (flowmeter). The device will measure the volume and the time of your urine flow. These measurements will be sent to a computer that creates a graph of your urine flow. Postvoid residual measurement This test measures how much urine is left in your bladder after you urinate. The test may be done with ultrasound. In this method, sound waves and a computer will be used to create an image of your bladder. The test can also be done by inserting a thin, flexible tube (catheter) into your bladder after you urinate. The remaining urine will be removed through the catheter so it can be measured. Remaining urine will be measured in milliliters (mL). If you have more than 100 mL left in your bladder after you urinate, your bladder is not emptying as it should. Cystometric testing This test uses a type of bladder catheter that can measure pressure. You may be given a medicine to numb the area (local anesthetic). The area around the opening of your urethra will be cleaned. A urinary catheter will be passed through your urethra  into your bladder and used to empty your bladder completely. A measuring catheter will be placed, and your bladder will be filled with warm, germ-free (sterile) water. Pressure measurements will be taken: As your bladder fills. When you feel the need to urinate. As your bladder is emptied. You may be asked to cough or bear down to check for leakage. In some cases, your bladder may be filled with a material that shows up on X-rays (contrast material) so that X-ray pictures can be taken during the test. Electromyogram This test measures the electrical activity of the nerves and muscles of your bladder and the opening of your urethra. Sticky patches (electrodes) will be placed near your rectum and urethra to measure electrical activity. The measurements will show how well your nerves are communicating with your muscles. What can I expect after the test? You should be able to go home right away and do your usual activities. You may be told to drink a glass of water every 30 minutes for the first 2 hours after testing. Taking a warm bath or using warm, wet cloths (warm compresses) may relieve any discomfort near your urethra. What do the results mean? Talk with your health care provider about what  your results mean. Some common causes for abnormal results from urodynamic tests include: Enlarged prostate in men. Overactive bladder. Urinary tract infection. Nervous system diseases. Spinal cord damage. Questions to ask your health care provider Ask your health care provider, or the department that is doing the test: When will my results be ready? How will I get my results? What are my treatment options? What other tests do I need? What are my next steps? Contact a health care provider if: You have pain. You have blood in your urine. You have chills. You have a fever. Summary Urodynamic tests are done to determine how well your lower urinary tract is working. The lower urinary tract  includes your bladder and urethra. You may need urodynamic testing to help find the cause of various problems with urination, such as leaking urine (incontinence) or problems starting or stopping your urine flow. You may have various urodynamic tests. The tests may be done separately or may all be done during one testing visit. Talk with your health care provider about what your results mean. Contact your health care provider if you have pain, chills, a fever, or blood in your urine. This information is not intended to replace advice given to you by your health care provider. Make sure you discuss any questions you have with your health care provider. Document Revised: 05/20/2020 Document Reviewed: 05/20/2020 Elsevier Patient Education  2022 [employer].   Urethral Vaginal Sling A urethral vaginal sling procedure is surgery to correct urinary incontinence. Urinary incontinence is passing urine without one's control. It is common in older women, and in women who have had children. In this surgery, a strong piece of material is placed under the tube that drains the bladder (urethra). This sling is made of tension-free vaginal tape or nylon mesh. It fits under the urethra like a hammock. The sling is put in position to straighten, support, and hold the urethra in its normal position. Tell a health care provider about: Any allergies you have. All medicines you are taking, including vitamins, herbs, eye drops, creams, and over-the-counter medicines. Any problems you or family members have had with anesthetic medicines. Any blood disorders you have. Any surgeries you have had. Any medical conditions you have. Whether you are pregnant or may be pregnant. What are the risks? Generally, this is a safe procedure. However, problems may occur, including: Infection. Excessive bleeding. Allergic reactions to medicines. Damage to other structures or organs. Problems urinating for several days or  weeks. Return of the urinary incontinence. Mesh failure. Be sure to talk with your health care provider about the options you have for sling material and the risks associated with each material. What happens before the procedure? Staying hydrated Follow instructions from your health care provider about hydration, which may include: Up to 2 hours before the procedure - you may continue to drink clear liquids, such as water, clear fruit juice, black coffee, and plain tea. Eating and drinking restrictions Follow instructions from your health care provider about eating and drinking, which may include: 8 hours before the procedure - stop eating heavy meals or foods such as meat, fried foods, or fatty foods. 6 hours before the procedure - stop eating light meals or foods, such as toast or cereal. 6 hours before the procedure - stop drinking milk or drinks that contain milk. 2 hours before the procedure - stop drinking clear liquids. Medicines Ask your health care provider about: Changing or stopping your regular medicines. This is especially important if  you are taking diabetes medicines or blood thinners. Taking over-the-counter medicines, vitamins, herbs, and supplements. Taking medicines such as aspirin and ibuprofen. These medicines can thin your blood. Do not take these medicines unless your health care provider tells you to take them. You may be given antibiotic medicine to help prevent infection. General instructions Ask your health care provider how your surgical site will be marked or identified. You may be asked to shower with a germ-killing soap. Do not use any products that contain nicotine or tobacco, such as cigarettes and e-cigarettes, for 2 weeks before the surgery. If you need help quitting, ask your health care provider. Plan to have someone take you home from the hospital or clinic. What happens during the procedure? To reduce your risk of infection: Your health care team will  wash or sanitize their hands. Hair may be removed from the surgical area. Your skin will be washed with soap. An IV will be inserted into one of your veins. You will be given one or both of the following: A medicine to make you fall asleep (general anesthetic). A medicine that is injected into your spine to numb the area below and slightly above the injection site (spinal anesthetic). A catheter will be placed in your bladder to drain urine during the procedure. An incision will be made in your vagina and on the lower part of your abdomen. The sling material will be passed around your bladder neck and stitched (sutured) to the muscles to hold the urethra in its normal position. The incisions will then be closed with sutures. The procedure may vary among health care providers and hospitals. What happens after the procedure? Your blood pressure, heart rate, breathing rate, and blood oxygen level will be monitored until the medicines you were given have worn off. You will have a catheter in place to drain your bladder. This will stay in place until your bladder is working properly on its own. You may have a gauze packing in the vagina to prevent bleeding. This will be removed in 1-2 days. Summary A urethral vaginal sling procedure is surgery to correct urinary incontinence. In this surgery, a strong piece of material is placed under the urethra to hold it in its normal position. Follow instructions from your health care provider about eating and drinking before the procedure. After surgery, you will have a urinary catheter in place until your bladder works properly on its own again. This information is not intended to replace advice given to you by your health care provider. Make sure you discuss any questions you have with your health care provider. Document Revised: 09/27/2017 Document Reviewed: 01/23/2017 Elsevier Patient Education  2021 [employer].

## 2021-07-06 NOTE — Progress Notes (Signed)
GYNECOLOGY  VISIT   HPI: 45 y.o.   Married  [place] American  female   G4P0 with Patient's last menstrual period was 06/08/2021 (approximate).   here for  discussion about potential hysterectomy due to dysmenorrhea and fibroids.  Patient is also completing serum STD screening due to history of trichomonas which has been treated earlier this year.  Uterus is 10 x 9 x 7 cm with fibroids.  Menses produce clots in the toilet first 2 days.  She had 2 cycles in August, but does not usually have this.  Having pain even when she is not on her menses.  Ibuprofen is not helping adequately.   Hx ablation.  Hx tubal.  Declines future childbearing.   Patient saw Dr. 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   He recommended Barbette Merino, and the patient declines this.   She is concerned about urinary incontinence.  Leaks urine with cough and sneeze or leaning forward.  No leak for no reason.  Has urgency and can leak related to that. She is interested in potential surgery for leakage.   Works in [employer] and cannot leave r station quickly to void.   She will see GI for rectal bleeding on 07/17/21.  GYNECOLOGIC HISTORY: Patient's last menstrual period was 06/08/2021 (approximate). Contraception: Tubal Menopausal hormone therapy:  none Last mammogram: 04/2021 normal per patient/[facility] Last pap smear:  03-03-21 Neg:Neg HR HPV, 11-13-16 Neg:Neg HR HPV        OB History     Gravida  4   Para      Term      Preterm      AB      Living  4      SAB      IAB      Ectopic      Multiple      Live Births  4              Patient Active Problem List   Diagnosis Date Noted   Cholelithiasis 01/13/2019   Pre-diabetes 11/15/2016   Uterine fibroid 04/27/2016   Abnormal uterine bleeding (AUB) 09/28/2015   History of bilateral tubal ligation 09/28/2015   Tobacco abuse  09/28/2015    Past Medical History:  Diagnosis Date   Abnormal Pap smear of cervix    Anxiety    Borderline diabetes    Depression    Hypertension    Trichomonas infection 02/2021    Past Surgical History:  Procedure Laterality Date   CESAREAN SECTION     CHOLECYSTECTOMY N/A 01/14/2019   Procedure: LAPAROSCOPIC CHOLECYSTECTOMY WITH POSSIBLE  INTRAOPERATIVE CHOLANGIOGRAM;  Surgeon: Armandina Gemma, MD;  Location: [facility];  Service: General;  Laterality: N/A;   DILITATION & CURRETTAGE/HYSTROSCOPY WITH HYDROTHERMAL ABLATION N/A 06/22/2016   Procedure: DILATATION & CURETTAGE/HYSTEROSCOPY WITH HYDROTHERMAL ABLATION;  Surgeon: Shelly Bombard, MD;  Location: [facility];  Service: Gynecology;  Laterality: N/A;   TUBAL LIGATION      Current Outpatient Medications  Medication Sig Dispense Refill   betamethasone dipropionate 0.05 % cream Apply topically.     cetirizine (ZYRTEC) 10 MG tablet Take 1 tablet (10 mg total) by mouth daily. 30 tablet 4   clotrimazole (LOTRIMIN) 1 % cream APPLY CREAM TOPICALLY TO AFFECTED AREA TWICE DAILY     levocetirizine (XYZAL) 5 MG tablet Take 5 mg by mouth daily.     predniSONE (DELTASONE) 20  MG tablet Take 20 mg by mouth 2 (two) times daily.     sertraline (ZOLOFT) 100 MG tablet Take 1 tablet (100 mg total) by mouth daily. 90 tablet 0   triamcinolone ointment (KENALOG) 0.5 %      amLODipine (NORVASC) 10 MG tablet Take 1 tablet (10 mg total) by mouth daily. 90 tablet 0   No current facility-administered medications for this visit.     ALLERGIES: Oxycodone-acetaminophen and Tramadol  Family History  Problem Relation Age of Onset   Hypertension Maternal Grandmother    Diabetes Maternal Grandfather     Social History   Socioeconomic History   Marital status: Married    Spouse name: Not on file   Number of children: Not on file   Years of education: Not on file   Highest education level: Not on file  Occupational History   Not on file  Tobacco Use    Smoking status: Every Day    Packs/day: 0.10    Types: Cigarettes    Last attempt to quit: 07/15/2014    Years since quitting: 6.9   Smokeless tobacco: Never  Vaping Use   Vaping Use: Never used  Substance and Sexual Activity   Alcohol use: Not Currently   Drug use: Never   Sexual activity: Not Currently    Partners: Male    Birth control/protection: Surgical    Comment: BTL  Other Topics Concern   Not on file  Social History Narrative   Not on file   Social Determinants of Health   Financial Resource Strain: Not on file  Food Insecurity: Not on file  Transportation Needs: Not on file  Physical Activity: Not on file  Stress: Not on file  Social Connections: Not on file  Intimate Partner Violence: Not on file    Review of Systems  All other systems reviewed and are negative.  PHYSICAL EXAMINATION:    BP 124/82   Pulse 90   Ht '5\' 2"'$  (1.575 m)   LMP 06/08/2021 (Approximate)   SpO2 98%   BMI 32.37 kg/m     General appearance: alert, cooperative and appears stated age  ASSESSMENT  Fibroids and dysmenorrhea with pelvic pain.  Mixed incontinence.  Hx trichomonas.  Treated. Rectal bleeding.   PLAN  We discussed fibroids, pelvic pain, postablation syndrome, possible endometriosis.  We discussed total laparoscopic hysterectomy with bilateral salpingectomy, possible bilateral oophorectomy (if abnormal or heavy bleeding intraop), and cystoscopy.    I reviewed risks, benefits, and alternatives of hysterectomy.  Risks include but are not limited to bleeding, infection, damage to surrounding organs, pneumonia, reaction to anesthesia, DVT, PE, death, need for reoperation, hernia formation, vaginal cuff dehiscence, neuropathy, continued pain, need to convert to a traditional laparotomy incision to complete the procedure. She understand that hysterectomy is not a guarantee of resolution of pelvic pain but should treat painful menstruation.   Urodynamic testing recommended.   Procedure and rationale explained.  May need midurethral sling at time of laparoscopic hysterectomy.   We discussed that a midurethral sling does not treat urgency and overactive bladder.  Medication can be used to treat this form of urinary incontinence.   CBC today and serum STD screening.   Will work toward scheduling surgery.  An After Visit Summary was printed and given to the patient.

## 2021-07-07 ENCOUNTER — Telehealth: Payer: Self-pay | Admitting: Obstetrics and Gynecology

## 2021-07-07 DIAGNOSIS — L2084 Intrinsic (allergic) eczema: Secondary | ICD-10-CM | POA: Diagnosis not present

## 2021-07-07 DIAGNOSIS — N3946 Mixed incontinence: Secondary | ICD-10-CM

## 2021-07-07 DIAGNOSIS — L503 Dermatographic urticaria: Secondary | ICD-10-CM | POA: Diagnosis not present

## 2021-07-07 DIAGNOSIS — Z01812 Encounter for preprocedural laboratory examination: Secondary | ICD-10-CM

## 2021-07-07 LAB — CBC
HCT: 45.6 % — ABNORMAL HIGH (ref 35.0–45.0)
Hemoglobin: 13.7 g/dL (ref 11.7–15.5)
MCH: 22.5 pg — ABNORMAL LOW (ref 27.0–33.0)
MCHC: 30 g/dL — ABNORMAL LOW (ref 32.0–36.0)
MCV: 74.8 fL — ABNORMAL LOW (ref 80.0–100.0)
MPV: 9.5 fL (ref 7.5–12.5)
Platelets: 379 10*3/uL (ref 140–400)
RBC: 6.1 10*6/uL — ABNORMAL HIGH (ref 3.80–5.10)
RDW: 14.5 % (ref 11.0–15.0)
WBC: 7.1 10*3/uL (ref 3.8–10.8)

## 2021-07-07 LAB — HEPATITIS C ANTIBODY
Hepatitis C Ab: NONREACTIVE
SIGNAL TO CUT-OFF: 0.03 (ref <1.00)

## 2021-07-07 LAB — RPR: RPR Ser Ql: NONREACTIVE

## 2021-07-07 LAB — HEPATITIS B SURFACE ANTIGEN: Hepatitis B Surface Ag: NONREACTIVE

## 2021-07-07 LAB — HIV ANTIBODY (ROUTINE TESTING W REFLEX): HIV 1&2 Ab, 4th Generation: NONREACTIVE

## 2021-07-07 NOTE — Telephone Encounter (Signed)
Please start working toward surgical care for patient.   She will be having a total laparoscopic hysterectomy, bilateral salpingectomy, possible bilateral oophorectomy, cystoscopy, and possible TVT midurethral sling.   She has fibroids, dysmenorrhea, and mixed incontinence.   She has had an endometrial ablation and a prior tubal ligation.   She will need urodynamic testing scheduled for evaluation of her bladder function.   I believe that her goal is to have surgery in November, 2022.  She will need about 3.5 hours of surgical time.   She also has an upcoming appointment for evaluation of rectal bleeding, so that evaluation will need to be complete prior to having her surgical care.

## 2021-07-13 NOTE — Telephone Encounter (Signed)
Spoke with patient.  Patient is scheduled for GI consult on 07/17/21.  Reviewed dates of urodynamics: 9/27 or 10/18.  Patient will return call on 9/19 to provide update and proceed with scheduling urodynamics.   If cleared by GI, once urodynamics is completed will plan for surgery on 09/11/21. Patient is aware we will schedule surgery once urodynamics consult completed. Patient verbalizes understanding and is agreeable.   Routing to [employer] for benefits for urodynamics.

## 2021-07-17 ENCOUNTER — Ambulatory Visit: Payer: BC Managed Care – PPO | Admitting: Physician Assistant

## 2021-07-24 NOTE — Telephone Encounter (Signed)
Spoke with patient.  Patient is scheduled for GI consult on 08/11/21.  Request to proceed with Urodynamics testing on 08/15/21.   Lab appt scheduled for UA on 08/09/21 at 3:30pm. Future lab order placed.   Urodynamics testing on 08/15/21 at 11am. Patient has been instructed to arrive with full bladder.   Patient will proceed with GI evaluation as planned. Patient is aware we will proceed with scheduling surgery once GI evaluation complete. Patient verbalizes understanding and is agreeable.   Routing to Dr. Antony Blackbird  Cc: Angie Fava Carder

## 2021-07-24 NOTE — Telephone Encounter (Signed)
Spoke with patient regarding benefits for scheduled Urodynamic Profile Study. Patient acknowledges understanding of information presented.

## 2021-07-26 NOTE — Telephone Encounter (Signed)
Thank you for the update!

## 2021-07-31 ENCOUNTER — Other Ambulatory Visit: Payer: Self-pay

## 2021-07-31 ENCOUNTER — Ambulatory Visit (INDEPENDENT_AMBULATORY_CARE_PROVIDER_SITE_OTHER): Payer: BC Managed Care – PPO | Admitting: Nurse Practitioner

## 2021-07-31 ENCOUNTER — Encounter: Payer: Self-pay | Admitting: Nurse Practitioner

## 2021-07-31 DIAGNOSIS — I10 Essential (primary) hypertension: Secondary | ICD-10-CM

## 2021-07-31 DIAGNOSIS — F32A Depression, unspecified: Secondary | ICD-10-CM

## 2021-07-31 DIAGNOSIS — F419 Anxiety disorder, unspecified: Secondary | ICD-10-CM

## 2021-07-31 MED ORDER — AMLODIPINE BESYLATE 10 MG PO TABS: 10.0000 mg | ORAL_TABLET | Freq: Every day | ORAL | 0 refills | Status: DC

## 2021-07-31 MED ORDER — SERTRALINE HCL 100 MG PO TABS: 100.0000 mg | ORAL_TABLET | Freq: Every day | ORAL | 0 refills | Status: DC

## 2021-07-31 NOTE — Patient Instructions (Signed)
Medication Refills  Hypertension:  Will refill Norvasc  Anxiety / depression:  Will refill Zoloft  Follow up:  Follow up in 1 month or sooner if needed with PCP

## 2021-07-31 NOTE — Progress Notes (Signed)
Virtual Visit via Telephone Note  I connected with Brianna Gutierrez on 07/31/21 at 10:00 AM EDT by telephone and verified that I am speaking with the correct person using two identifiers.  Location: Patient: home Provider: office   I discussed the limitations, risks, security and privacy concerns of performing an evaluation and management service by telephone and the availability of in person appointments. I also discussed with the patient that there may be a patient responsible charge related to this service. The patient expressed understanding and agreed to proceed.   History of Present Illness:  Patient presents today for medication refill through televisit.  This is a patient of Amy Minette Brine.  She is requesting a refill on her Norvasc and Zoloft.  She states that these medications have been working well for her.  She states that she does check her blood pressure at home and it has been within range on Norvasc.  Patient is tolerating Zoloft well at current dosage. Denies f/c/s, n/v/d, hemoptysis, PND, chest pain or edema.     Observations/Objective:  Vitals with BMI 07/06/2021 06/08/2021 04/27/2021  Height 5\' 2"  5\' 2"  5\' 2"   Weight - 177 lbs 177 lbs  BMI - 56.38 93.73  Systolic 428 768 115  Diastolic 82 84 70  Pulse 90 82 76      Assessment and Plan:  Medication Refills  Hypertension:  Will refill Norvasc  Anxiety / depression:  Will refill Zoloft  Follow up:  Follow up in 1 month or sooner if needed with PCP     I discussed the assessment and treatment plan with the patient. The patient was provided an opportunity to ask questions and all were answered. The patient agreed with the plan and demonstrated an understanding of the instructions.   The patient was advised to call back or seek an in-person evaluation if the symptoms worsen or if the condition fails to improve as anticipated.  I provided 23 minutes of non-face-to-face time during this encounter.   Fenton Foy, NP

## 2021-08-09 ENCOUNTER — Other Ambulatory Visit: Payer: BC Managed Care – PPO

## 2021-08-09 ENCOUNTER — Other Ambulatory Visit: Payer: Self-pay

## 2021-08-09 DIAGNOSIS — N3946 Mixed incontinence: Secondary | ICD-10-CM | POA: Diagnosis not present

## 2021-08-10 ENCOUNTER — Other Ambulatory Visit: Payer: Self-pay

## 2021-08-11 ENCOUNTER — Encounter: Payer: Self-pay | Admitting: Gastroenterology

## 2021-08-11 ENCOUNTER — Encounter: Payer: Self-pay | Admitting: Nurse Practitioner

## 2021-08-11 ENCOUNTER — Ambulatory Visit (INDEPENDENT_AMBULATORY_CARE_PROVIDER_SITE_OTHER): Payer: BC Managed Care – PPO | Admitting: Nurse Practitioner

## 2021-08-11 VITALS — BP 110/80 | HR 100 | Ht 62.0 in | Wt 197.1 lb

## 2021-08-11 DIAGNOSIS — K5900 Constipation, unspecified: Secondary | ICD-10-CM

## 2021-08-11 DIAGNOSIS — K625 Hemorrhage of anus and rectum: Secondary | ICD-10-CM | POA: Diagnosis not present

## 2021-08-11 LAB — URINALYSIS, COMPLETE W/RFL CULTURE
Bilirubin Urine: NEGATIVE
Glucose, UA: NEGATIVE
Hgb urine dipstick: NEGATIVE
Ketones, ur: NEGATIVE
Leukocyte Esterase: NEGATIVE
Nitrites, Initial: NEGATIVE
RBC / HPF: NONE SEEN /HPF (ref 0–2)
Specific Gravity, Urine: 1.017 (ref 1.001–1.035)
pH: 5.5 (ref 5.0–8.0)

## 2021-08-11 LAB — URINE CULTURE
MICRO NUMBER:: 12493577
SPECIMEN QUALITY:: ADEQUATE

## 2021-08-11 LAB — CULTURE INDICATED

## 2021-08-11 NOTE — Patient Instructions (Signed)
PROCEDURES: You have been scheduled for a colonoscopy. Please follow the written instructions given to you at your visit today. If you use inhalers (even only as needed), please bring them with you on the day of your procedure.  RECOMMENDATIONS: Miralax- Dissolve one capful in 8 ounces of water and drink before bed. Desitin: Apply a small amount to the external and internal anal area three times a day as needed.  It was great seeing you today! Thank you for entrusting me with your care and choosing [facility].  Noralyn Pick, CRNP  The [facility] providers would like to encourage you to use [facility] to communicate with providers for non-urgent requests or questions.  Due to long hold times on the telephone, sending your provider a message by [facility] may be faster and more efficient way to get a response. Please allow 48 business hours for a response.  Please remember that this is for non-urgent requests/questions.  If you are age 17 or older, your body mass index should be between 23-30. Your Body mass index is 36.05 kg/m. If this is out of the aforementioned range listed, please consider follow up with your Primary Care Provider.  If you are age 17 or younger, your body mass index should be between 19-25. Your Body mass index is 36.05 kg/m. If this is out of the aformentioned range listed, please consider follow up with your Primary Care Provider.

## 2021-08-11 NOTE — Progress Notes (Signed)
Marland KitchenMarland KitchenMarland Kitchen.................................    08/11/2021 [name] [id number] July 16, 1976   CHIEF COMPLAINT: Rectal bleeding   HISTORY OF PRESENT ILLNESS:  Brianna Gutierrez is a 45 year old female with a past medical history of anxiety, depression and hypertension. C section 1997. Tubal ligation 2002. Cholecystectomy 2020. She presents to our office today as referred by Dr. Rolly Pancake for further evaluation regarding rectal bleeding.  She complains of having rectal bleeding for the past 2 months x 3 to 4 episodes which occurred after straining to pass a hard stool.  She described the blood is bright red and was seen on the toilet tissue.  She has some anal rectal discomfort at times she sees blood.  No abdominal pain.  She started taking a stool softener and her constipation has improved.  No further rectal bleeding for the past 4 weeks.  She drinks about 56 ounces of water daily.  She drinks 1/2 cup of coffee daily.  She eats a fairly healthy diet.  She has heartburn only if she eats spicy or greasy foods such as pizza.  No dysphagia or upper abdominal pain.  She has a history of uterine fibroids apparently her groin and she is scheduled to have a partial hysterectomy by Dr. Cathie Olden on 09/11/2021.  She was advised by Dr. Quincy Simmonds to undergo colonoscopy prior to pursuing her hysterectomy surgery.  No known family history of colon polyps or colorectal cancer.  CBC Latest Ref Rng & Units 07/06/2021 04/06/2021 02/20/2021  WBC 3.8 - 10.8 Thousand/uL 7.1 6.7 6.5  Hemoglobin 11.7 - 15.5 g/dL 13.7 12.8 13.1  Hematocrit 35.0 - 45.0 % 45.6(H) 41.5 43.3  Platelets 140 - 400 Thousand/uL 379 397 404(H)    CMP Latest Ref Rng & Units 04/06/2021 02/20/2021 12/23/2020  Glucose 65 - 99 mg/dL - - -  BUN 6 - 24 mg/dL - - -  Creatinine 0.57 - 1.00 mg/dL - - -  Sodium 134 - 144 mmol/L - - -  Potassium 3.5 - 5.2 mmol/L - - -  Chloride 96 - 106 mmol/L - - -  CO2 20 - 29 mmol/L - - -  Calcium 8.7 - 10.2 mg/dL - - -  Total Protein  6.1 - 8.1 g/dL 7.4 7.4 7.3  Total Bilirubin 0.2 - 1.2 mg/dL 0.2 0.2 <0.2  Alkaline Phos 44 - 121 IU/L - - 104  AST 10 - 30 U/L 11 11 13   ALT 6 - 29 U/L 14 17 19      Past Medical History:  Diagnosis Date   Abnormal Pap smear of cervix    Anxiety    Borderline diabetes    Depression    Hypertension    Trichomonas infection 02/2021   Past Surgical History:  Procedure Laterality Date   CESAREAN SECTION     CHOLECYSTECTOMY N/A 01/14/2019   Procedure: LAPAROSCOPIC CHOLECYSTECTOMY WITH POSSIBLE  INTRAOPERATIVE CHOLANGIOGRAM;  Surgeon: Armandina Gemma, MD;  Location: [facility];  Service: General;  Laterality: N/A;   DILITATION & CURRETTAGE/HYSTROSCOPY WITH HYDROTHERMAL ABLATION N/A 06/22/2016   Procedure: DILATATION & CURETTAGE/HYSTEROSCOPY WITH HYDROTHERMAL ABLATION;  Surgeon: Shelly Bombard, MD;  Location: [facility];  Service: Gynecology;  Laterality: N/A;   TUBAL LIGATION     Social History: She is married.  She has 2 daughters and 2 girls.  She is an Surveyor, [employer].  She smoked cigarettes < 1ppd x 10 years, quit 6 months ago. No alcohol or drug use.  Family History: Father age 82. Mother deceased age 32 secondary MVA. MGM  with HTN. MGF with DM.  Allergies  Allergen Reactions   Oxycodone-Acetaminophen Hives    Pt tolerates Hydrocodone   Tramadol Other (See Comments)    Headache     Outpatient Encounter Medications as of 08/11/2021  Medication Sig   amLODipine (NORVASC) 10 MG tablet Take 1 tablet (10 mg total) by mouth daily.   betamethasone dipropionate 0.05 % cream Apply topically.   cetirizine (ZYRTEC) 10 MG tablet Take 1 tablet (10 mg total) by mouth daily.   clotrimazole (LOTRIMIN) 1 % cream APPLY CREAM TOPICALLY TO AFFECTED AREA TWICE DAILY   levocetirizine (XYZAL) 5 MG tablet Take 5 mg by mouth daily.   predniSONE (DELTASONE) 20 MG tablet Take 20 mg by mouth 2 (two) times daily.   sertraline (ZOLOFT) 100 MG tablet Take 1 tablet (100 mg total) by mouth daily.   triamcinolone  ointment (KENALOG) 0.5 %    [DISCONTINUED] dicyclomine (BENTYL) 20 MG tablet Take 1 tablet (20 mg total) by mouth 2 (two) times daily.   [DISCONTINUED] omeprazole (PRILOSEC) 20 MG capsule Take 1 capsule (20 mg total) by mouth daily.   No facility-administered encounter medications on file as of 08/11/2021.   REVIEW OF SYSTEMS:  Gen: Denies fever, sweats or chills. No weight loss.  CV: Denies chest pain, palpitations or edema. Resp: Denies cough, shortness of breath of hemoptysis.  GI: See HPI. GU : Denies urinary burning, blood in urine, increased urinary frequency or incontinence. MS: Denies joint pain, muscles aches or weakness. Derm: Denies rash, itchiness, skin lesions or unhealing ulcers. Psych: Denies depression, anxiety, memory loss, suicidal ideation and confusion. Heme: Denies bruising, bleeding. Neuro:  Denies headaches, dizziness or paresthesias. Endo:  Denies any problems with DM, thyroid or adrenal function.  PHYSICAL EXAM: BP 110/80   Pulse 100   Ht 5\' 2"  (1.575 m)   Wt 197 lb 2 oz (89.4 kg)   LMP  (LMP Unknown)   BMI 36.05 kg/m   General: 46 year old female in no acute distress. Head: Normocephalic and atraumatic. Eyes:  Sclerae non-icteric, conjunctive pink. Ears: Normal auditory acuity. Mouth: Dentition intact. No ulcers or lesions.  Neck: Supple, no lymphadenopathy or thyromegaly.  Lungs: Clear bilaterally to auscultation without wheezes, crackles or rhonchi. Heart: Regular rate and rhythm. No murmur, rub or gallop appreciated.  Abdomen: Soft, nontender, non distended. No masses. No hepatosplenomegaly. Normoactive bowel sounds x 4 quadrants.  Rectal: Patient declined rectal exam, to proceed with a colonoscopy for further evaluation.  Musculoskeletal: Symmetrical with no gross deformities. Skin: Warm and dry. No rash or lesions on visible extremities. Extremities: No edema. Neurological: Alert oriented x 4, no focal deficits.  Psychological:  Alert and  cooperative. Normal mood and affect.  ASSESSMENT AND PLAN:  99) 45 year old female with rectal bleeding associated with constipation and straining.  Colonoscopy requested prior to proceeding with a partial hysterectomy. -Colonoscopy benefits and risks discussed including risk with sedation, risk of bleeding, perforation and infection  -Miralax -Apply a small amount of Desitin inside the anal opening and to the external anal area tid as needed for anal or hemorrhoidal irritation/bleeding.  -Further recommendations to be determined after colonoscopy completed   2) Uterine fibroids scheduled for partial hysterectomy 09/11/2021    CC:  [name], NP

## 2021-08-15 ENCOUNTER — Ambulatory Visit: Payer: BC Managed Care – PPO | Admitting: *Deleted

## 2021-08-15 ENCOUNTER — Other Ambulatory Visit: Payer: Self-pay

## 2021-08-15 DIAGNOSIS — N3946 Mixed incontinence: Secondary | ICD-10-CM | POA: Diagnosis not present

## 2021-08-15 NOTE — Progress Notes (Signed)
Brianna Gutierrez is a 45 y.o. female Who presents today for urodynamics testing, ordered by Dr. Quincy Simmonds.    Allergies and medications reviewed.  Denies complaints today. No urinary complaints.   Urine Micro exam: negative for WBC's or RBC's, okay to proceed per Dr. Quincy Simmonds.  Patient reports urinary leakage with coughing, sneezing, exercise.   Urodynamics testing initiated. Lumax Bladder Catheter #10 [place] and lumax Abdominal Catheter #10 [place].   Post void residual 0 ml.   Urethral catheter placed without issue. Rectal catheter placed without issue.   Urodynamics testing completed. Please see scanned Patient summary report in Epic. Procedure completed and patient tolerated well without complaints. Patient scheduled for follow up office visit with Dr. Quincy Simmonds to discuss results. Patient agreeable.   Patient given post procedure instructions:  You may have a mild bladder and rectal discomfort for a few hours after the test. You may experience some frequent urination and slight burning the first few times you urinate after the test. Rarely, the urine may be blood tinged. These are both due to catheter placements and resolve quickly. You should call our office immediately if you have signs of infection, which may include bladder pain, urinary urgency, fever, or burning during urination. We do encourage you to drink plenty of water after the test.

## 2021-08-15 NOTE — Telephone Encounter (Signed)
Spoke with patient while in office.  Colonoscopy scheduled for 08/18/21.  Patient request to proceed with surgery on 09/11/21.   OV for urodynamics consult and pre-op scheduled for 10/31.  Patient aware I will contact her once GI evaluation complete.   Routing to Dr. Antony Blackbird.   Cc: Hayley Carder

## 2021-08-15 NOTE — Telephone Encounter (Signed)
Thank you for the update!

## 2021-08-17 ENCOUNTER — Encounter: Payer: Self-pay | Admitting: Certified Registered Nurse Anesthetist

## 2021-08-17 NOTE — Progress Notes (Signed)
Agree with the assessment and plan as outlined by Colleen Kennedy-Smith, NP.   Prescious Hurless, DO, FACG [facility]   

## 2021-08-17 NOTE — Telephone Encounter (Signed)
Surgery request sent.  

## 2021-08-18 ENCOUNTER — Encounter: Payer: Self-pay | Admitting: Gastroenterology

## 2021-08-18 ENCOUNTER — Ambulatory Visit (AMBULATORY_SURGERY_CENTER): Payer: BC Managed Care – PPO | Admitting: Gastroenterology

## 2021-08-18 VITALS — BP 139/92 | HR 88 | Temp 96.8°F | Resp 20 | Ht 62.0 in | Wt 197.0 lb

## 2021-08-18 DIAGNOSIS — K649 Unspecified hemorrhoids: Secondary | ICD-10-CM

## 2021-08-18 DIAGNOSIS — K5900 Constipation, unspecified: Secondary | ICD-10-CM | POA: Diagnosis not present

## 2021-08-18 DIAGNOSIS — K648 Other hemorrhoids: Secondary | ICD-10-CM | POA: Diagnosis not present

## 2021-08-18 DIAGNOSIS — K635 Polyp of colon: Secondary | ICD-10-CM

## 2021-08-18 DIAGNOSIS — K640 First degree hemorrhoids: Secondary | ICD-10-CM

## 2021-08-18 DIAGNOSIS — D125 Benign neoplasm of sigmoid colon: Secondary | ICD-10-CM

## 2021-08-18 DIAGNOSIS — K625 Hemorrhage of anus and rectum: Secondary | ICD-10-CM

## 2021-08-18 DIAGNOSIS — K5730 Diverticulosis of large intestine without perforation or abscess without bleeding: Secondary | ICD-10-CM

## 2021-08-18 MED ORDER — SODIUM CHLORIDE 0.9 % IV SOLN: 500.0000 mL | Freq: Once | INTRAVENOUS | Status: DC

## 2021-08-18 NOTE — Progress Notes (Signed)
Report given to PACU, vss 

## 2021-08-18 NOTE — Telephone Encounter (Signed)
Dr. Quincy Simmonds -see GI report dated 08/18/21. OK to proceed with surgery as scheduled on 11/14?   Pre-op and urodynamics consult scheduled for 10/31.

## 2021-08-18 NOTE — Progress Notes (Signed)
Pt's states no medical or surgical changes since previsit or office visit. 

## 2021-08-18 NOTE — Op Note (Signed)
[facility] Patient Name: Brianna Gutierrez Procedure Date: 08/18/2021 10:02 AM MRN: [id number] Endoscopist: Gerrit Heck , MD Age: 45 Referring MD:  Date of Birth: 1976/06/17 Gender: Female Account #: [id number] Procedure:                Colonoscopy Indications:              This is the patient's first colonoscopy,                            Hematochezia, Change in bowel habits, Constipation Medicines:                Monitored Anesthesia Care Procedure:                Pre-Anesthesia Assessment:                           - Prior to the procedure, a History and Physical                            was performed, and patient medications and                            allergies were reviewed. The patient's tolerance of                            previous anesthesia was also reviewed. The risks                            and benefits of the procedure and the sedation                            options and risks were discussed with the patient.                            All questions were answered, and informed consent                            was obtained. Prior Anticoagulants: The patient has                            taken no previous anticoagulant or antiplatelet                            agents. ASA Grade Assessment: II - A patient with                            mild systemic disease. After reviewing the risks                            and benefits, the patient was deemed in                            satisfactory condition to undergo the procedure.  After obtaining informed consent, the colonoscope                            was passed under direct vision. Throughout the                            procedure, the patient's blood pressure, pulse, and                            oxygen saturations were monitored continuously. The                            CF [id number] #[id number] was introduced through the anus                            and advanced to  the the terminal ileum. The                            colonoscopy was performed without difficulty. The                            patient tolerated the procedure well. The quality                            of the bowel preparation was good. The terminal                            ileum, ileocecal valve, appendiceal orifice, and                            rectum were photographed. Scope In: 10:20:59 AM Scope Out: 10:38:31 AM Scope Withdrawal Time: 0 hours 15 minutes 15 seconds  Total Procedure Duration: 0 hours 17 minutes 32 seconds  Findings:                 The perianal and digital rectal examinations were                            normal.                           Two sessile polyps were found in the sigmoid colon.                            The polyps were 2 to 5 mm in size. These polyps                            were removed with a cold snare. Resection was                            complete. The smaller of the 2 polyps disintegrated                            in the colonoscope upon retrieval. Estimated blood  loss was minimal.                           A few small-mouthed diverticula were found in the                            sigmoid colon.                           Non-bleeding internal hemorrhoids were found during                            retroflexion. The hemorrhoids were small.                           The terminal ileum appeared normal. Complications:            No immediate complications. Estimated Blood Loss:     Estimated blood loss was minimal. Impression:               - Two 2 to 5 mm polyps in the sigmoid colon,                            removed with a cold snare. Resected and retrieved.                           - Diverticulosis in the sigmoid colon.                           - The remainder of the colon was otherwise normal                            appearing.                           - Non-bleeding internal hemorrhoids.                            - The examined portion of the ileum was normal. Recommendation:           - Patient has a contact number available for                            emergencies. The signs and symptoms of potential                            delayed complications were discussed with the                            patient. Return to normal activities tomorrow.                            Written discharge instructions were provided to the                            patient.                           -  Resume previous diet.                           - Continue present medications.                           - Await pathology results.                           - Repeat colonoscopy for surveillance based on                            pathology results.                           - Use fiber, for example Citrucel, Fibercon, Konsyl                            or Metamucil.                           - Internal hemorrhoids were noted on this study and                            may be amenable to hemorrhoid band ligation. If you                            are interested in further treatment of these                            hemorrhoids with band ligation, please contact my                            clinic to set up an appointment for evaluation and                            treatment.                           - Return to GI clinic PRN. Gerrit Heck, MD 08/18/2021 10:45:03 AM

## 2021-08-18 NOTE — Progress Notes (Signed)
GASTROENTEROLOGY PROCEDURE H&P NOTE   Primary Care Physician: [name], NP    Reason for Procedure:   Hematochezia, constipation  Plan:    Colonoscopy  Patient is appropriate for endoscopic procedure(s) in the ambulatory ([facility]) setting.  The nature of the procedure, as well as the risks, benefits, and alternatives were carefully and thoroughly reviewed with the patient. Ample time for discussion and questions allowed. The patient understood, was satisfied, and agreed to proceed.     HPI: Brianna Gutierrez is a 45 y.o. female who presents for colonoscopy for evaluation of hematochezia, change in bowel habits, constipation.  Patient was most recently seen in the Gastroenterology Clinic on 08/11/2021 by Carl Best, NP.  No interval change in medical history since that appointment. Please refer to that note for full details regarding GI history and clinical presentation.   Past Medical History:  Diagnosis Date   Abnormal Pap smear of cervix    Anxiety    Borderline diabetes    Depression    Hypertension    Trichomonas infection 02/2021    Past Surgical History:  Procedure Laterality Date   CESAREAN SECTION     CHOLECYSTECTOMY N/A 01/14/2019   Procedure: LAPAROSCOPIC CHOLECYSTECTOMY WITH POSSIBLE  INTRAOPERATIVE CHOLANGIOGRAM;  Surgeon: Armandina Gemma, MD;  Location: [facility];  Service: General;  Laterality: N/A;   DILITATION & CURRETTAGE/HYSTROSCOPY WITH HYDROTHERMAL ABLATION N/A 06/22/2016   Procedure: DILATATION & CURETTAGE/HYSTEROSCOPY WITH HYDROTHERMAL ABLATION;  Surgeon: Shelly Bombard, MD;  Location: [facility];  Service: Gynecology;  Laterality: N/A;   TUBAL LIGATION      Prior to Admission medications   Medication Sig Start Date End Date Taking? Authorizing Provider  amLODipine (NORVASC) 10 MG tablet Take 1 tablet (10 mg total) by mouth daily. 07/31/21 10/29/21 Yes Fenton Foy, NP  cetirizine (ZYRTEC) 10 MG tablet Take 1 tablet (10 mg total) by mouth daily.  02/07/21  Yes Minette Brine, Amy J, NP  sertraline (ZOLOFT) 100 MG tablet Take 1 tablet (100 mg total) by mouth daily. 07/31/21 10/29/21 Yes Fenton Foy, NP  betamethasone dipropionate 0.05 % cream Apply topically. Patient not taking: Reported on 08/18/2021 06/22/21   [provider]  levocetirizine (XYZAL) 5 MG tablet Take 5 mg by mouth daily. Patient not taking: Reported on 08/18/2021 06/09/21   [provider]  dicyclomine (BENTYL) 20 MG tablet Take 1 tablet (20 mg total) by mouth 2 (two) times daily. 10/29/19 08/24/20  Ok Edwards, PA-C  omeprazole (PRILOSEC) 20 MG capsule Take 1 capsule (20 mg total) by mouth daily. 01/01/19 08/07/19  Scot Jun, FNP    Current Outpatient Medications  Medication Sig Dispense Refill   amLODipine (NORVASC) 10 MG tablet Take 1 tablet (10 mg total) by mouth daily. 90 tablet 0   cetirizine (ZYRTEC) 10 MG tablet Take 1 tablet (10 mg total) by mouth daily. 30 tablet 4   sertraline (ZOLOFT) 100 MG tablet Take 1 tablet (100 mg total) by mouth daily. 90 tablet 0   betamethasone dipropionate 0.05 % cream Apply topically. (Patient not taking: Reported on 08/18/2021)     levocetirizine (XYZAL) 5 MG tablet Take 5 mg by mouth daily. (Patient not taking: Reported on 08/18/2021)     Current Facility-Administered Medications  Medication Dose Route Frequency Provider Last Rate Last Admin   0.9 %  sodium chloride infusion  500 mL Intravenous Once Maylea Soria V, DO        Allergies as of 08/18/2021 - Review Complete 08/18/2021  Allergen Reaction Noted   Tramadol Other (See Comments) 05/14/2016    Family History  Problem Relation Age of Onset   Hypertension Maternal Grandmother    Diabetes Maternal Grandfather    Colon cancer Neg Hx    Esophageal cancer Neg Hx    Rectal cancer Neg Hx    Stomach cancer Neg Hx     Social History   Socioeconomic History   Marital status: Married    Spouse name: Not on file   Number of children: Not on file    Years of education: Not on file   Highest education level: Not on file  Occupational History   Not on file  Tobacco Use   Smoking status: Every Day    Packs/day: 0.10    Types: Cigarettes    Last attempt to quit: 07/15/2014    Years since quitting: 7.0   Smokeless tobacco: Never  Vaping Use   Vaping Use: Never used  Substance and Sexual Activity   Alcohol use: Not Currently   Drug use: Never   Sexual activity: Not Currently    Partners: Male    Birth control/protection: Surgical    Comment: BTL  Other Topics Concern   Not on file  Social History Narrative   Not on file   Social Determinants of Health   Financial Resource Strain: Not on file  Food Insecurity: Not on file  Transportation Needs: Not on file  Physical Activity: Not on file  Stress: Not on file  Social Connections: Not on file  Intimate Partner Violence: Not on file    Physical Exam: Vital signs in last 24 hours: @BP  (!) 144/104   Pulse 82   Temp (!) 96.8 F (36 C) (Temporal)   Resp 18   Ht 5\' 2"  (1.575 m)   Wt 197 lb (89.4 kg)   LMP 08/18/2021   SpO2 99%   BMI 36.03 kg/m  GEN: NAD EYE: Sclerae anicteric ENT: MMM CV: Non-tachycardic Pulm: CTA b/l GI: Soft, NT/ND NEURO:  Alert & Oriented x 3   Gerrit Heck, DO [facility]   08/18/2021 10:13 AM

## 2021-08-18 NOTE — Patient Instructions (Signed)
Handouts given for hemorrhoids, hemorrhoid banding, Diverticulosis, high fiber diet and polyps.   YOU HAD AN ENDOSCOPIC PROCEDURE TODAY AT [facility]:   Refer to the procedure report that was given to you for any specific questions about what was found during the examination.  If the procedure report does not answer your questions, please call your gastroenterologist to clarify.  If you requested that your care partner not be given the details of your procedure findings, then the procedure report has been included in a sealed envelope for you to review at your convenience later.  YOU SHOULD EXPECT: Some feelings of bloating in the abdomen. Passage of more gas than usual.  Walking can help get rid of the air that was put into your GI tract during the procedure and reduce the bloating. If you had a lower endoscopy (such as a colonoscopy or flexible sigmoidoscopy) you may notice spotting of blood in your stool or on the toilet paper. If you underwent a bowel prep for your procedure, you may not have a normal bowel movement for a few days.  Please Note:  You might notice some irritation and congestion in your nose or some drainage.  This is from the oxygen used during your procedure.  There is no need for concern and it should clear up in a day or so.  SYMPTOMS TO REPORT IMMEDIATELY:  Following lower endoscopy (colonoscopy or flexible sigmoidoscopy):  Excessive amounts of blood in the stool  Significant tenderness or worsening of abdominal pains  Swelling of the abdomen that is new, acute  Fever of 100F or higher  For urgent or emergent issues, a gastroenterologist can be reached at any hour by calling [phone]. Do not use MyChart messaging for urgent concerns.    DIET:  We do recommend a small meal at first, but then you may proceed to your regular diet.  Drink plenty of fluids but you should avoid alcoholic beverages for 24 hours.  ACTIVITY:  You should plan to take it  easy for the rest of today and you should NOT DRIVE or use heavy machinery until tomorrow (because of the sedation medicines used during the test).    FOLLOW UP: Our staff will call the number listed on your records 48-72 hours following your procedure to check on you and address any questions or concerns that you may have regarding the information given to you following your procedure. If we do not reach you, we will leave a message.  We will attempt to reach you two times.  During this call, we will ask if you have developed any symptoms of COVID 19. If you develop any symptoms (ie: fever, flu-like symptoms, shortness of breath, cough etc.) before then, please call [phone].  If you test positive for Covid 19 in the 2 weeks post procedure, please call and report this information to [place].    If any biopsies were taken you will be contacted by phone or by letter within the next 1-3 weeks.  Please call us at [phone] if you have not heard about the biopsies in 3 weeks.    SIGNATURES/CONFIDENTIALITY: You and/or your care partner have signed paperwork which will be entered into your electronic medical record.  These signatures attest to the fact that that the information above on your After Visit Summary has been reviewed and is understood.  Full responsibility of the confidentiality of this discharge information lies with you and/or your care-partner.

## 2021-08-18 NOTE — Progress Notes (Signed)
Called to room to assist during endoscopic procedure.  Patient ID and intended procedure confirmed with present staff. Received instructions for my participation in the procedure from the performing physician.  

## 2021-08-21 ENCOUNTER — Telehealth: Payer: Self-pay | Admitting: General Surgery

## 2021-08-21 NOTE — Telephone Encounter (Signed)
-----   Message from Holley, DO sent at 08/18/2021  2:36 PM EDT ----- Please set up for hemorrhoid banding. Thanks.

## 2021-08-21 NOTE — Telephone Encounter (Signed)
Spoke with patient and she is scheduled to have a hysterectomy with bladder sling and would like to wait until after her procedure to have the bandings. She will call back in late December to schedule.

## 2021-08-22 ENCOUNTER — Telehealth: Payer: Self-pay

## 2021-08-22 NOTE — Telephone Encounter (Signed)
  Follow up Call-  Call back number 08/18/2021  Post procedure Call Back phone  # [phone]  Permission to leave phone message Yes  Some recent data might be hidden     Patient questions:  Do you have a fever, pain , or abdominal swelling? No. Pain Score  0 *  Have you tolerated food without any problems? Yes.    Have you been able to return to your normal activities? Yes.    Do you have any questions about your discharge instructions: Diet   No. Medications  No. Follow up visit  No.  Do you have questions or concerns about your Care? No.  Actions: * If pain score is 4 or above: No action needed, pain <4.  Have you developed a fever since your procedure? No   2.   Have you had an respiratory symptoms (SOB or cough) since your procedure? No   3.   Have you tested positive for COVID 19 since your procedure no   4.   Have you had any family members/close contacts diagnosed with the COVID 19 since your procedure?  No    If yes to any of these questions please route to Joylene John, RN and Joella Prince, RN

## 2021-08-23 NOTE — Telephone Encounter (Signed)
Ok to proceed with surgery on 09/11/21.

## 2021-08-23 NOTE — Progress Notes (Signed)
GYNECOLOGY  VISIT   HPI: 45 y.o.   Married  [place] American  female   G4P0 with Patient's last menstrual period was 08/18/2021 (exact date).   here for surgical consultation and to discuss urodynamics results.   Patient desires hysterectomy for dysmenorrhea and fibroids.   US shows uterus 10 x 9 x 7 cm with fibroids.  Has pain outside of her menses.  Has had a tubal and an endometrial ablation.  Declines future childbearing.   Dr. Chilton Greathouse recommended against myomectomy due to concern for potential post ablation syndrome.   Patient has urinary incontinence and desires surgical treatment for this at the time of her hysterectomy procedure.  She leaks urine and cough and sneeze.   Also has urge incontinence.  Multichannel urodynamic testing on 08/15/21: Uroflow - void 59 cc, PVR 0 cc.  Continuous flow.  CMG:  S2 36 cc, S2 151 cc, S3 183 cc.  Stable.  Max capacity 194 cc.  VLPP:   97 cm H2O at 151 cc.  UPP:  34 cm H2O at 194 cc.  Pressure flow study:  void 108 cc.  P Det Max 96cm H2O.    Saw GI for rectal bleeding and colonoscopy 08/18/21 showed hemorrhoids and polyps.   A1C 5.9 on 12/23/20.   Quit smoking 7 months ago.   She brings in paperwork for FMLA.  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   GYNECOLOGIC HISTORY: Patient's last menstrual period was 08/18/2021 (exact date). Contraception: TUBAL Menopausal hormone therapy:  NONE Last mammogram:  04/2021 normal per patient/[facility] Last pap smear:   03-03-21 Neg:Neg HR HPV, 11-13-16 Neg:Neg HR HPV               OB History     Gravida  4   Para      Term      Preterm      AB      Living  4      SAB      IAB      Ectopic      Multiple      Live Births  4              Patient Active Problem List   Diagnosis Date Noted   Cholelithiasis 01/13/2019   Pre-diabetes 11/15/2016   Uterine fibroid 04/27/2016   Abnormal uterine bleeding (AUB) 09/28/2015   History of bilateral tubal ligation 09/28/2015    Tobacco abuse 09/28/2015    Past Medical History:  Diagnosis Date   Abnormal Pap smear of cervix    Anxiety    Borderline diabetes    Depression    Hypertension    Trichomonas infection 02/2021    Past Surgical History:  Procedure Laterality Date   CESAREAN SECTION     CHOLECYSTECTOMY N/A 01/14/2019   Procedure: LAPAROSCOPIC CHOLECYSTECTOMY WITH POSSIBLE  INTRAOPERATIVE CHOLANGIOGRAM;  Surgeon: Armandina Gemma, MD;  Location: [facility];  Service: General;  Laterality: N/A;   DILITATION & CURRETTAGE/HYSTROSCOPY WITH HYDROTHERMAL ABLATION N/A 06/22/2016   Procedure: DILATATION & CURETTAGE/HYSTEROSCOPY WITH HYDROTHERMAL ABLATION;  Surgeon: Shelly Bombard, MD;  Location: [facility];  Service: Gynecology;  Laterality: N/A;   TUBAL LIGATION      Current Outpatient Medications  Medication Sig Dispense Refill   amLODipine (NORVASC) 10 MG tablet Take 1 tablet (10 mg total) by mouth daily. 90 tablet 0   betamethasone dipropionate 0.05 % cream Apply topically. (Patient not taking: Reported on 08/18/2021)     cetirizine (ZYRTEC)  10 MG tablet Take 1 tablet (10 mg total) by mouth daily. 30 tablet 4   levocetirizine (XYZAL) 5 MG tablet Take 5 mg by mouth daily. (Patient not taking: Reported on 08/18/2021)     sertraline (ZOLOFT) 100 MG tablet Take 1 tablet (100 mg total) by mouth daily. 90 tablet 0   No current facility-administered medications for this visit.     ALLERGIES: Tramadol  Family History  Problem Relation Age of Onset   Hypertension Maternal Grandmother    Diabetes Maternal Grandfather    Colon cancer Neg Hx    Esophageal cancer Neg Hx    Rectal cancer Neg Hx    Stomach cancer Neg Hx     Social History   Socioeconomic History   Marital status: Married    Spouse name: Not on file   Number of children: Not on file   Years of education: Not on file   Highest education level: Not on file  Occupational History   Not on file  Tobacco Use   Smoking status: Every Day     Packs/day: 0.10    Types: Cigarettes    Last attempt to quit: 07/15/2014    Years since quitting: 7.1   Smokeless tobacco: Never  Vaping Use   Vaping Use: Never used  Substance and Sexual Activity   Alcohol use: Not Currently   Drug use: Never   Sexual activity: Not Currently    Partners: Male    Birth control/protection: Surgical    Comment: BTL  Other Topics Concern   Not on file  Social History Narrative   Not on file   Social Determinants of Health   Financial Resource Strain: Not on file  Food Insecurity: Not on file  Transportation Needs: Not on file  Physical Activity: Not on file  Stress: Not on file  Social Connections: Not on file  Intimate Partner Violence: Not on file    Review of Systems  All other systems reviewed and are negative.  PHYSICAL EXAMINATION:    BP 118/78   Pulse 76   Ht 5\' 2"  (1.575 m)   Wt 197 lb (89.4 kg)   LMP 08/18/2021 (Exact Date)   BMI 36.03 kg/m     General appearance: alert, cooperative and appears stated age Head: Normocephalic, without obvious abnormality, atraumatic Neck: no adenopathy, supple, symmetrical, trachea midline and thyroid normal to inspection and palpation Lungs: clear to auscultation bilaterally Heart: regular rate and rhythm Abdomen: soft, protuberant, non-tender, no masses,  no organomegaly Extremities: extremities normal, atraumatic, no cyanosis or edema Skin: Skin color, texture, turgor normal. No rashes or lesions No abnormal inguinal nodes palpated Neurologic: Grossly normal  Pelvic: External genitalia:  no lesions              Urethra:  normal appearing urethra with no masses, tenderness or lesions              Bartholins and Skenes: normal                 Vagina: normal appearing vagina with normal color and discharge, no lesions              Cervix: no lesions                Bimanual Exam:  Uterus:  normal size, contour, position, consistency, mobility, non-tender              Adnexa: no mass,  fullness, tenderness  Chaperone was present for exam:  Estill Bamberg, CMA.  ASSESSMENT  Fibroids and dysmenorrhea with pelvic pain.  Status post Cesarean Section.  Status post tubal ligation.  Status post endometrial ablation.  Mixed incontinence.  Hx trichomonas.  Treated. Hx rectal bleeding with dx of polyps and hemorrhoids.  Prediabetes.   PLAN  We reviewed her urodynamic testing and diagnosis of mixed incontinence with some mild urinary retention.  We discussed midurethral sling at the time of her total laparoscopic hysterectomy with bilateral salpingectomy and cystoscopy.  She understands that a sling procedure uses a permanent mesh and that slings are approximately 85 - 90% effective for treating urinary stress incontinence and that slings do not treat overactive bladder leakage.   We discussed risks of slings include but are not limited to bleeding, infection, pain, accidental damage to surrounding organs, DVT, PE, death, need for reoperation, cystotomy, urinary retention requiring bladder catheterization, self catheterization, or surgical loosening or cutting of the sling, mesh erosions and exposures requiring reoperation, slower voiding, increased overactive bladder symptoms that may require treatment, and urinary tract infection.  We reviewed that urinary incontinence can recur.   Patient wishes to proceed with midurethral sling at the time of her total laparoscopic hysterectomy with bilateral salpingectomy, possible bilateral oophorectomy, and cystoscopy.  Risks, benefits and alternatives have been reviewed.  Surgical expectations and recovery discussed.  She will be out of work for 6 weeks following surgery.   Will check A1C today.    An After Visit Summary was printed and given to the patient.  30 min  total time was spent for this patient encounter, including preparation, face-to-face counseling with the patient, coordination of care, and documentation of the  encounter.

## 2021-08-25 ENCOUNTER — Encounter: Payer: Self-pay | Admitting: Gastroenterology

## 2021-08-28 ENCOUNTER — Ambulatory Visit (INDEPENDENT_AMBULATORY_CARE_PROVIDER_SITE_OTHER): Payer: BC Managed Care – PPO | Admitting: Obstetrics and Gynecology

## 2021-08-28 ENCOUNTER — Encounter: Payer: Self-pay | Admitting: Obstetrics and Gynecology

## 2021-08-28 ENCOUNTER — Other Ambulatory Visit: Payer: Self-pay

## 2021-08-28 VITALS — BP 118/78 | HR 76 | Ht 62.0 in | Wt 197.0 lb

## 2021-08-28 DIAGNOSIS — D219 Benign neoplasm of connective and other soft tissue, unspecified: Secondary | ICD-10-CM

## 2021-08-28 DIAGNOSIS — N946 Dysmenorrhea, unspecified: Secondary | ICD-10-CM | POA: Diagnosis not present

## 2021-08-28 DIAGNOSIS — R7309 Other abnormal glucose: Secondary | ICD-10-CM

## 2021-08-28 DIAGNOSIS — N3946 Mixed incontinence: Secondary | ICD-10-CM

## 2021-08-28 NOTE — Telephone Encounter (Signed)
Call to patient. Per DPR, OK to leave message on voicemail.   Left voicemail requesting a return call to review benefits for Scheduled Surgery with Brook Silva, MD, FACOG.  

## 2021-08-29 LAB — HEMOGLOBIN A1C
Hgb A1c MFr Bld: 6.1 % of total Hgb — ABNORMAL HIGH (ref <5.7)
Mean Plasma Glucose: 128 mg/dL
eAG (mmol/L): 7.1 mmol/L

## 2021-08-29 NOTE — Telephone Encounter (Signed)
Call to patient. Per DPR, OK to leave message on voicemail.   Left voicemail requesting a return call to review benefits for Scheduled Surgery with Brook Silva, MD, FACOG.  

## 2021-08-30 NOTE — Telephone Encounter (Signed)
Spoke with [employer] in [employer]. Case updated to TLH/BS/possible BO/cysto/ TVT Midurethral Sling.   Spoke with patient. Surgery date request confirmed.  Advised surgery is scheduled for 09/11/21, [place] at 1.  Surgery instruction sheet and hospital brochure reviewed, printed copy will be mailed. Patient advised of Covid screening and quarantine requirements and agreeable.   Call routed to [facility]

## 2021-08-31 ENCOUNTER — Other Ambulatory Visit: Payer: Self-pay

## 2021-08-31 ENCOUNTER — Encounter (HOSPITAL_BASED_OUTPATIENT_CLINIC_OR_DEPARTMENT_OTHER): Payer: Self-pay | Admitting: Obstetrics and Gynecology

## 2021-08-31 DIAGNOSIS — D259 Leiomyoma of uterus, unspecified: Secondary | ICD-10-CM | POA: Diagnosis not present

## 2021-08-31 DIAGNOSIS — N393 Stress incontinence (female) (male): Secondary | ICD-10-CM | POA: Diagnosis not present

## 2021-08-31 DIAGNOSIS — Z01818 Encounter for other preprocedural examination: Secondary | ICD-10-CM | POA: Diagnosis not present

## 2021-08-31 DIAGNOSIS — N946 Dysmenorrhea, unspecified: Secondary | ICD-10-CM | POA: Diagnosis not present

## 2021-08-31 DIAGNOSIS — R21 Rash and other nonspecific skin eruption: Secondary | ICD-10-CM

## 2021-08-31 HISTORY — DX: Rash and other nonspecific skin eruption: R21

## 2021-08-31 NOTE — Progress Notes (Addendum)
YOU ARE SCHEDULED FOR A COVID TEST ON  09-17-2021   . THIS TEST MUST BE DONE BEFORE SURGERY. GO TO  [facility] @ [place]   PHONE NUMBER [phone].   TURN LEFT AT THE SHIPPING AND RECEIVING SIGN AND LOOK FOR SMALL BLUE POP UP TENT UNDER BUILDING OVERHANG AT BACK OF BUILDING AND REMAIN IN YOUR CAR, THIS IS A DRIVE UP TEST.  AFTER YOUR COVID TEST , PLEASE WEAR A MASK OUT IN PUBLIC AND SOCIAL DISTANCE AND [place] YOUR HANDS FREQUENTLY. PLEASE ASK ALL YOUR CLOSE HOUSEHOLD CONTACT TO WEAR MASK OUT IN PUBLIC AND SOCIAL DISTANCE AND [place] HANDS FREQUENTLY ALSO.      Your procedure is scheduled on 09-11-2021  Report to [facility].   Call this number if you have problems the morning of surgery  :[phone].   OUR ADDRESS IS [address].  WE ARE LOCATED IN [facility].  PLEASE BRING YOUR INSURANCE CARD AND PHOTO ID DAY OF SURGERY.  ONLY ONE PERSON ALLOWED IN FACILITY WAITING AREA.                                     REMEMBER:  DO NOT EAT FOOD, CANDY GUM OR MINTS  AFTER MIDNIGHT . YOU MAY HAVE CLEAR LIQUIDS FROM MIDNIGHT UNTIL 430 AM. . DRINK ENSURE PRE SURGERY DRINK AT 430 AM MORNING OF YOUR SURGERY. NO CLEAR LIQUIDS AFTER 430 AM MORNING OF YOUR SURGERY.   YOU MAY  BRUSH YOUR TEETH MORNING OF SURGERY AND RINSE YOUR MOUTH OUT, NO CHEWING GUM CANDY OR MINTS.    CLEAR LIQUID DIET   Foods Allowed                                                                     Foods Excluded  Coffee and tea, regular and decaf                             liquids that you cannot  Plain Jell-O any favor except red or purple                                           see through such as: Fruit ices (not with fruit pulp)                                     milk, soups, orange juice  Iced Popsicles                                    All solid food Carbonated beverages, regular and diet  Cranberry, grape and apple juices Sports drinks like Gatorade Lightly seasoned clear broth or consume(fat free) Sugar  Sample Menu Breakfast                                Lunch                                     Supper Cranberry juice                    Beef broth                            Chicken broth Jell-O                                     Grape juice                           Apple juice Coffee or tea                        Jell-O                                      Popsicle                                                Coffee or tea                        Coffee or tea  _____________________________________________________________________     TAKE THESE MEDICATIONS MORNING OF SURGERY WITH A SIP OF WATER:  NONE  ONE VISITOR IS ALLOWED IN WAITING ROOM ONLY DAY OF SURGERY.  YOU MAY HAVE ANOTHER PERSON SWITCH OUT WITH THE  1  VISITOR IN THE WAITING ROOM DAY OF SURGERY AND A MASK MUST BE WORN IN THE WAITING ROOM.    2 VISITORS  MAY VISIT IN THE EXTENDED RECOVERY ROOM UNTIL 800 PM ONLY 1 VISITOR AGE 86 AND OVER MAY SPEND THE NIGHT AND MUST BE IN EXTENDED RECOVERY ROOM NO LATER THAN 800 PM .   UP TO 2 CHILDREN AGE 16 TO 15 MAY ALSO VISIT IN EXTENDED RECOVERY ROOM ONLY UNTIL 800 PM AND MUST LEAVE BY 800 PM. ALL PERSONS VISITING IN EXTENDED RECOVERY ROOM MUST WEAR A MASK.                                    DO NOT WEAR JEWERLY, MAKE UP. DO NOT WEAR LOTIONS, POWDERS, PERFUMES OR NAIL POLISH ON YOUR FINGERNAILS. TOENAIL POLISH IS OK TO WEAR. DO NOT SHAVE FOR 48 HOURS PRIOR TO DAY OF SURGERY. MEN MAY SHAVE FACE AND NECK. CONTACTS, GLASSES, OR DENTURES MAY NOT BE WORN TO SURGERY.  [facility] IS NOT RESPONSIBLE  FOR ANY BELONGINGS.                                                                    Marland Kitchen           [facility] - Preparing for Surgery Before surgery, you can play an important role.  Because skin is not sterile, your skin needs to be as free  of germs as possible.  You can reduce the number of germs on your skin by washing with CHG (chlorahexidine gluconate) soap before surgery.  CHG is an antiseptic cleaner which kills germs and bonds with the skin to continue killing germs even after washing. Please DO NOT use if you have an allergy to CHG or antibacterial soaps.  If your skin becomes reddened/irritated stop using the CHG and inform your nurse when you arrive at Short Stay. Do not shave (including legs and underarms) for at least 48 hours prior to the first CHG shower.  You may shave your face/neck. Please follow these instructions carefully:  1.  Shower with CHG Soap the night before surgery and the  morning of Surgery.  2.  If you choose to wash your hair, wash your hair first as usual with your  normal  shampoo.  3.  After you shampoo, rinse your hair and body thoroughly to remove the  shampoo.                            4.  Use CHG as you would any other liquid soap.  You can apply chg directly  to the skin and wash                      Gently with a scrungie or clean washcloth.  5.  Apply the CHG Soap to your body ONLY FROM THE NECK DOWN.   Do not use on face/ open                           Wound or open sores. Avoid contact with eyes, ears mouth and genitals (private parts).                       Wash face,  Genitals (private parts) with your normal soap.             6.  Wash thoroughly, paying special attention to the area where your surgery  will be performed.  7.  Thoroughly rinse your body with warm water from the neck down.  8.  DO NOT shower/wash with your normal soap after using and rinsing off  the CHG Soap.                9.  Pat yourself dry with a clean towel.            10.  Wear clean pajamas.            11.  Place clean sheets on your bed the night of your first shower and do not  sleep with pets. Day of Surgery : Do not apply any lotions/deodorants the morning of surgery.  Please wear clean clothes to the  hospital/surgery center.  IF YOU HAVE ANY SKIN IRRITATION OR PROBLEMS WITH THE SURGICAL SOAP, PLEASE GET A BAR OF GOLD DIAL SOAP AND SHOWER THE NIGHT BEFORE YOUR SURGERY AND THE MORNING OF YOUR SURGERY. PLEASE LET THE NURSE KNOW MORNING OF YOUR SURGERY IF YOU HAD ANY PROBLEMS WITH THE SURGICAL SOAP.  FAILURE TO FOLLOW THESE INSTRUCTIONS MAY RESULT IN THE CANCELLATION OF YOUR SURGERY PATIENT SIGNATURE_________________________________  NURSE SIGNATURE__________________________________  ________________________________________________________________________                                                        QUESTIONS Hansel Feinstein PRE OP NURSE PHONE [phone].

## 2021-08-31 NOTE — Progress Notes (Addendum)
Spoke w/ via phone for pre-op interview---pt Lab needs dos---- urine preg , bmp ekg per anesthesia, surgery orders requested dr Quincy Simmonds epic ib              Lab results------none COVID test -----09-11-2021 extended recovery Arrive at -------530 am 09-11-2021 NPO after MN NO Solid Food.  Drink ensure pre surgery drink at 430 am. Clear liquids from MN until---430 am Med rec completed Medications to take morning of surgery -----none Diabetic medication -----n/a Patient instructed no nail polish to be worn day of surgery Patient instructed to bring photo id and insurance card day of surgery Patient aware to have Driver (ride ) / caregiver    for 24 hours after surgery  sister shanta  Patient Special Instructions -----pt given extended recovery instructions Pre-Op special Istructions -----none Patient verbalized understanding of instructions that were given at this phone interview. Patient denies shortness of breath, chest pain, fever, cough at this phone interview.

## 2021-09-05 ENCOUNTER — Encounter: Payer: Self-pay | Admitting: Obstetrics and Gynecology

## 2021-09-07 ENCOUNTER — Other Ambulatory Visit: Payer: Self-pay | Admitting: Obstetrics and Gynecology

## 2021-09-07 ENCOUNTER — Encounter (HOSPITAL_COMMUNITY)
Admission: RE | Admit: 2021-09-07 | Discharge: 2021-09-07 | Disposition: A | Discharge status: Home / Self Care | Payer: BC Managed Care – PPO | Source: Ambulatory Visit | Attending: Obstetrics and Gynecology | Admitting: Obstetrics and Gynecology

## 2021-09-07 ENCOUNTER — Other Ambulatory Visit: Payer: Self-pay

## 2021-09-07 DIAGNOSIS — N393 Stress incontinence (female) (male): Secondary | ICD-10-CM | POA: Diagnosis not present

## 2021-09-07 DIAGNOSIS — D259 Leiomyoma of uterus, unspecified: Secondary | ICD-10-CM | POA: Diagnosis not present

## 2021-09-07 DIAGNOSIS — Z01818 Encounter for other preprocedural examination: Secondary | ICD-10-CM | POA: Insufficient documentation

## 2021-09-07 DIAGNOSIS — N946 Dysmenorrhea, unspecified: Secondary | ICD-10-CM | POA: Insufficient documentation

## 2021-09-07 LAB — BASIC METABOLIC PANEL
Anion gap: 8 (ref 5–15)
BUN: 13 mg/dL (ref 6–20)
CO2: 22 mmol/L (ref 22–32)
Calcium: 9.1 mg/dL (ref 8.9–10.3)
Chloride: 107 mmol/L (ref 98–111)
Creatinine, Ser: 0.8 mg/dL (ref 0.44–1.00)
GFR, Estimated: >60 mL/min (ref >60)
Glucose, Bld: 111 mg/dL — ABNORMAL HIGH (ref 70–99)
Potassium: 3.9 mmol/L (ref 3.5–5.1)
Sodium: 137 mmol/L (ref 135–145)

## 2021-09-07 LAB — CBC
HCT: 41.2 % (ref 36.0–46.0)
Hemoglobin: 12.6 g/dL (ref 12.0–15.0)
MCH: 22.7 pg — ABNORMAL LOW (ref 26.0–34.0)
MCHC: 30.6 g/dL (ref 30.0–36.0)
MCV: 74.1 fL — ABNORMAL LOW (ref 80.0–100.0)
Platelets: 376 10*3/uL (ref 150–400)
RBC: 5.56 MIL/uL — ABNORMAL HIGH (ref 3.87–5.11)
RDW: 16 % — ABNORMAL HIGH (ref 11.5–15.5)
WBC: 7.4 10*3/uL (ref 4.0–10.5)
nRBC: 0 % (ref 0.0–0.2)

## 2021-09-07 LAB — SARS CORONAVIRUS 2 (TAT 6-24 HRS): SARS Coronavirus 2: NEGATIVE

## 2021-09-07 NOTE — Progress Notes (Signed)
Patient ID: Brianna Gutierrez, female   DOB: [birth year], 45 y.o.   MRN: [id number]  Multichannel urodynamic testing on 08/15/21: Uroflow - void 59 cc, PVR 0 cc.  Continuous flow.  CMG:  S2 36 cc, S2 151 cc, S3 183 cc.  Stable.  Max capacity 194 cc.  VLPP:   97 cm H2O at 151 cc.  UPP:  34 cm H2O at 194 cc.  Pressure flow study:  void 108 cc.  P Det Max 96cm H2O.    Stress incontinence noted.

## 2021-09-09 NOTE — H&P (Signed)
Office Visit 08/28/2021 [facility], MD Obstetrics and Gynecology Fibroids +3 more Dx Pre-op Exam ; Referred by [name], NP Reason for Visit   Additional Documentation  Vitals:  BP 118/78 Pulse 76 Ht 5\' 2"  (1.575 m) Wt 89.4 kg LMP 08/18/2021 (Exact Date) BMI 36.03 kg/m BSA 1.98 m  More Vitals  Flowsheets:  Anthropometrics, NEWS, MEWS Score, Method of Visit   Encounter Info:  Billing Info, History, Allergies, Detailed Report    All Notes    Progress Notes by [name], MD at 08/28/2021 11:00 AM  Author: [name], MD Author Type: Physician Filed: 08/28/2021  7:39 PM  Note Status: Signed Cosign: Cosign Not Required Encounter Date: 08/28/2021  Editor: [name], MD (Physician)      Prior Versions: 1. Brianna Gutierrez, CMA (Certified Psychologist, sport and exercise) at 08/28/2021 11:09 AM - Sign when Signing Visit    GYNECOLOGY  VISIT   HPI: 45 y.o.   Married  [place] American  female   G4P0 with Patient's last menstrual period was 08/18/2021 (exact date).   here for surgical consultation and to discuss urodynamics results.    Patient desires hysterectomy for dysmenorrhea and fibroids.   US shows uterus 10 x 9 x 7 cm with fibroids.  Has pain outside of her menses.  Has had a tubal and an endometrial ablation.  Declines future childbearing.   Dr. Chilton Greathouse recommended against myomectomy due to concern for potential post ablation syndrome.    Patient has urinary incontinence and desires surgical treatment for this at the time of her hysterectomy procedure.  She leaks urine and cough and sneeze.   Also has urge incontinence.   Multichannel urodynamic testing on 08/15/21: Uroflow - void 59 cc, PVR 0 cc.  Continuous flow.  CMG:  S2 36 cc, S2 151 cc, S3 183 cc.  Stable.  Max capacity 194 cc.  VLPP:   97 cm H2O at 151 cc.  UPP:  34 cm H2O at 194 cc.  Pressure flow study:   void 108 cc.  P Det Max 96cm H2O.     Saw GI for rectal bleeding and colonoscopy 08/18/21 showed hemorrhoids and polyps.    A1C 5.9 on 12/23/20.    Quit smoking 7 months ago.    She brings in paperwork for FMLA.  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    GYNECOLOGIC HISTORY: Patient's last menstrual period was 08/18/2021 (exact date). Contraception: TUBAL Menopausal hormone therapy:  NONE Last mammogram:  04/2021 normal per patient/[facility] Last pap smear:   03-03-21 Neg:Neg HR HPV, 11-13-16 Neg:Neg HR HPV               OB History       Gravida  4   Para      Term      Preterm      AB      Living  4        SAB      IAB      Ectopic      Multiple      Live Births  4                     Patient Active Problem List    Diagnosis Date Noted   Cholelithiasis 01/13/2019   Pre-diabetes 11/15/2016   Uterine fibroid 04/27/2016   Abnormal uterine bleeding (AUB) 09/28/2015  History of bilateral tubal ligation 09/28/2015   Tobacco abuse 09/28/2015          Past Medical History:  Diagnosis Date   Abnormal Pap smear of cervix     Anxiety     Borderline diabetes     Depression     Hypertension     Trichomonas infection 02/2021           Past Surgical History:  Procedure Laterality Date   CESAREAN SECTION       CHOLECYSTECTOMY N/A 01/14/2019    Procedure: LAPAROSCOPIC CHOLECYSTECTOMY WITH POSSIBLE  INTRAOPERATIVE CHOLANGIOGRAM;  Surgeon: Armandina Gemma, MD;  Location: [facility];  Service: General;  Laterality: N/A;   DILITATION & CURRETTAGE/HYSTROSCOPY WITH HYDROTHERMAL ABLATION N/A 06/22/2016    Procedure: DILATATION & CURETTAGE/HYSTEROSCOPY WITH HYDROTHERMAL ABLATION;  Surgeon: Shelly Bombard, MD;  Location: [facility];  Service: Gynecology;  Laterality: N/A;   TUBAL LIGATION                Current Outpatient Medications  Medication Sig Dispense Refill   amLODipine (NORVASC) 10 MG tablet Take 1 tablet (10 mg total) by mouth daily. 90 tablet 0    betamethasone dipropionate 0.05 % cream Apply topically. (Patient not taking: Reported on 08/18/2021)       cetirizine (ZYRTEC) 10 MG tablet Take 1 tablet (10 mg total) by mouth daily. 30 tablet 4   levocetirizine (XYZAL) 5 MG tablet Take 5 mg by mouth daily. (Patient not taking: Reported on 08/18/2021)       sertraline (ZOLOFT) 100 MG tablet Take 1 tablet (100 mg total) by mouth daily. 90 tablet 0    No current facility-administered medications for this visit.      ALLERGIES: Tramadol        Family History  Problem Relation Age of Onset   Hypertension Maternal Grandmother     Diabetes Maternal Grandfather     Colon cancer Neg Hx     Esophageal cancer Neg Hx     Rectal cancer Neg Hx     Stomach cancer Neg Hx        Social History         Socioeconomic History   Marital status: Married      Spouse name: Not on file   Number of children: Not on file   Years of education: Not on file   Highest education level: Not on file  Occupational History   Not on file  Tobacco Use   Smoking status: Every Day      Packs/day: 0.10      Types: Cigarettes      Last attempt to quit: 07/15/2014      Years since quitting: 7.1   Smokeless tobacco: Never  Vaping Use   Vaping Use: Never used  Substance and Sexual Activity   Alcohol use: Not Currently   Drug use: Never   Sexual activity: Not Currently      Partners: Male      Birth control/protection: Surgical      Comment: BTL  Other Topics Concern   Not on file  Social History Narrative   Not on file    Social Determinants of Health    Financial Resource Strain: Not on file  Food Insecurity: Not on file  Transportation Needs: Not on file  Physical Activity: Not on file  Stress: Not on file  Social Connections: Not on file  Intimate Partner Violence: Not on file      Review of Systems  All other systems reviewed and are negative.   PHYSICAL EXAMINATION:     BP 118/78   Pulse 76   Ht 5\' 2"  (1.575 m)   Wt 197 lb (89.4 kg)    LMP 08/18/2021 (Exact Date)   BMI 36.03 kg/m     General appearance: alert, cooperative and appears stated age Head: Normocephalic, without obvious abnormality, atraumatic Neck: no adenopathy, supple, symmetrical, trachea midline and thyroid normal to inspection and palpation Lungs: clear to auscultation bilaterally Heart: regular rate and rhythm Abdomen: soft, protuberant, non-tender, no masses,  no organomegaly Extremities: extremities normal, atraumatic, no cyanosis or edema Skin: Skin color, texture, turgor normal. No rashes or lesions No abnormal inguinal nodes palpated Neurologic: Grossly normal   Pelvic: External genitalia:  no lesions              Urethra:  normal appearing urethra with no masses, tenderness or lesions              Bartholins and Skenes: normal                 Vagina: normal appearing vagina with normal color and discharge, no lesions              Cervix: no lesions                Bimanual Exam:  Uterus:  normal size, contour, position, consistency, mobility, non-tender              Adnexa: no mass, fullness, tenderness           Chaperone was present for exam:  Estill Bamberg, CMA.   ASSESSMENT   Fibroids and dysmenorrhea with pelvic pain.  Status post Cesarean Section.  Status post tubal ligation.  Status post endometrial ablation.  Mixed incontinence.  Hx trichomonas.  Treated. Hx rectal bleeding with dx of polyps and hemorrhoids.  Prediabetes.    PLAN   We reviewed her urodynamic testing and diagnosis of mixed incontinence with some mild urinary retention.  We discussed midurethral sling at the time of her total laparoscopic hysterectomy with bilateral salpingectomy and cystoscopy.  She understands that a sling procedure uses a permanent mesh and that slings are approximately 85 - 90% effective for treating urinary stress incontinence and that slings do not treat overactive bladder leakage.   We discussed risks of slings include but are not limited to  bleeding, infection, pain, accidental damage to surrounding organs, DVT, PE, death, need for reoperation, cystotomy, urinary retention requiring bladder catheterization, self catheterization, or surgical loosening or cutting of the sling, mesh erosions and exposures requiring reoperation, slower voiding, increased overactive bladder symptoms that may require treatment, and urinary tract infection.  We reviewed that urinary incontinence can recur.    Patient wishes to proceed with midurethral sling at the time of her total laparoscopic hysterectomy with bilateral salpingectomy, possible bilateral oophorectomy, and cystoscopy.  Risks, benefits and alternatives have been reviewed.  Surgical expectations and recovery discussed.  She will be out of work for 6 weeks following surgery.    Will check A1C today.    An After Visit Summary was printed and given to the patient.   30 min  total time was spent for this patient encounter, including preparation, face-to-face counseling with the patient, coordination of care, and documentation of the encounter.

## 2021-09-10 NOTE — Anesthesia Preprocedure Evaluation (Addendum)
Anesthesia Evaluation  Patient identified by MRN, date of birth, ID band Patient awake    Reviewed: Allergy & Precautions, NPO status , Patient's Chart, lab work & pertinent test results  Airway Mallampati: II  TM Distance: >3 FB Neck ROM: Full    Dental  (+) Teeth Intact, Chipped,    Pulmonary neg pulmonary ROS, Current Smoker and Patient abstained from smoking.,    Pulmonary exam normal        Cardiovascular hypertension, Pt. on medications  Rhythm:Regular Rate:Normal     Neuro/Psych Anxiety Depression negative neurological ROS     GI/Hepatic Neg liver ROS, GERD  ,  Endo/Other  negative endocrine ROS  Renal/GU negative Renal ROS  Female GU complaint Fibroids, dysmenorrhea     Musculoskeletal negative musculoskeletal ROS (+)   Abdominal Normal abdominal exam  (+)   Peds  Hematology negative hematology ROS (+)   Anesthesia Other Findings   Reproductive/Obstetrics                            Anesthesia Physical Anesthesia Plan  ASA: 2  Anesthesia Plan: General   Post-op Pain Management:    Induction: Intravenous  PONV Risk Score and Plan: 2 and Ondansetron, Dexamethasone and Midazolam  Airway Management Planned: Mask and Oral ETT  Additional Equipment: None  Intra-op Plan:   Post-operative Plan: Extubation in OR  Informed Consent: I have reviewed the patients History and Physical, chart, labs and discussed the procedure including the risks, benefits and alternatives for the proposed anesthesia with the patient or authorized representative who has indicated his/her understanding and acceptance.     Dental advisory given  Plan Discussed with: CRNA  Anesthesia Plan Comments: (Lab Results      Component                Value               Date                      WBC                      7.4                 09/07/2021                HGB                      12.6                 09/07/2021                HCT                      41.2                09/07/2021                MCV                      74.1 (L)            09/07/2021                PLT                      376  09/07/2021           Lab Results      Component                Value               Date                      NA                       137                 09/07/2021                K                        3.9                 09/07/2021                CO2                      22                  09/07/2021                GLUCOSE                  111 (H)             09/07/2021                BUN                      13                  09/07/2021                CREATININE               0.80                09/07/2021                CALCIUM                  9.1                 09/07/2021                GFRNONAA                 >60                 09/07/2021          )       Anesthesia Quick Evaluation

## 2021-09-11 ENCOUNTER — Observation Stay (HOSPITAL_BASED_OUTPATIENT_CLINIC_OR_DEPARTMENT_OTHER)
Admission: RE | Admit: 2021-09-11 | Discharge: 2021-09-12 | Disposition: A | Discharge status: Home / Self Care | Payer: BC Managed Care – PPO | Source: Ambulatory Visit | Attending: Obstetrics and Gynecology | Admitting: Obstetrics and Gynecology

## 2021-09-11 ENCOUNTER — Observation Stay (HOSPITAL_BASED_OUTPATIENT_CLINIC_OR_DEPARTMENT_OTHER): Payer: BC Managed Care – PPO | Admitting: Anesthesiology

## 2021-09-11 ENCOUNTER — Encounter (HOSPITAL_BASED_OUTPATIENT_CLINIC_OR_DEPARTMENT_OTHER): Payer: Self-pay | Admitting: Obstetrics and Gynecology

## 2021-09-11 ENCOUNTER — Encounter (HOSPITAL_BASED_OUTPATIENT_CLINIC_OR_DEPARTMENT_OTHER)
Admission: RE | Disposition: A | Discharge status: Home / Self Care | Payer: Self-pay | Source: Ambulatory Visit | Attending: Obstetrics and Gynecology

## 2021-09-11 DIAGNOSIS — R7303 Prediabetes: Secondary | ICD-10-CM | POA: Insufficient documentation

## 2021-09-11 DIAGNOSIS — Z79899 Other long term (current) drug therapy: Secondary | ICD-10-CM | POA: Diagnosis not present

## 2021-09-11 DIAGNOSIS — N838 Other noninflammatory disorders of ovary, fallopian tube and broad ligament: Secondary | ICD-10-CM | POA: Insufficient documentation

## 2021-09-11 DIAGNOSIS — Z8719 Personal history of other diseases of the digestive system: Secondary | ICD-10-CM | POA: Diagnosis not present

## 2021-09-11 DIAGNOSIS — F17210 Nicotine dependence, cigarettes, uncomplicated: Secondary | ICD-10-CM | POA: Insufficient documentation

## 2021-09-11 DIAGNOSIS — D259 Leiomyoma of uterus, unspecified: Principal | ICD-10-CM | POA: Insufficient documentation

## 2021-09-11 DIAGNOSIS — K660 Peritoneal adhesions (postprocedural) (postinfection): Secondary | ICD-10-CM | POA: Diagnosis not present

## 2021-09-11 DIAGNOSIS — N393 Stress incontinence (female) (male): Secondary | ICD-10-CM | POA: Diagnosis not present

## 2021-09-11 DIAGNOSIS — N362 Urethral caruncle: Secondary | ICD-10-CM | POA: Diagnosis not present

## 2021-09-11 DIAGNOSIS — N840 Polyp of corpus uteri: Secondary | ICD-10-CM | POA: Diagnosis not present

## 2021-09-11 DIAGNOSIS — Z90710 Acquired absence of both cervix and uterus: Secondary | ICD-10-CM | POA: Diagnosis present

## 2021-09-11 DIAGNOSIS — N368 Other specified disorders of urethra: Secondary | ICD-10-CM | POA: Diagnosis not present

## 2021-09-11 DIAGNOSIS — N946 Dysmenorrhea, unspecified: Secondary | ICD-10-CM | POA: Diagnosis not present

## 2021-09-11 DIAGNOSIS — I10 Essential (primary) hypertension: Secondary | ICD-10-CM

## 2021-09-11 DIAGNOSIS — D219 Benign neoplasm of connective and other soft tissue, unspecified: Secondary | ICD-10-CM | POA: Diagnosis not present

## 2021-09-11 HISTORY — DX: Gastro-esophageal reflux disease without esophagitis: K21.9

## 2021-09-11 HISTORY — DX: Stress incontinence (female) (male): N39.3

## 2021-09-11 HISTORY — DX: Prediabetes: R73.03

## 2021-09-11 HISTORY — PX: TOTAL LAPAROSCOPIC HYSTERECTOMY WITH SALPINGECTOMY: SHX6742

## 2021-09-11 HISTORY — PX: BLADDER SUSPENSION: SHX72

## 2021-09-11 HISTORY — DX: Dermatitis, unspecified: L30.9

## 2021-09-11 HISTORY — DX: Dysmenorrhea, unspecified: N94.6

## 2021-09-11 HISTORY — PX: CYSTOSCOPY: SHX5120

## 2021-09-11 HISTORY — DX: Leiomyoma of uterus, unspecified: D25.9

## 2021-09-11 LAB — ABO/RH: ABO/RH(D): O POS

## 2021-09-11 LAB — TYPE AND SCREEN
ABO/RH(D): O POS
Antibody Screen: NEGATIVE

## 2021-09-11 LAB — GLUCOSE, CAPILLARY
Glucose-Capillary: 185 mg/dL — ABNORMAL HIGH (ref 70–99)
Glucose-Capillary: 165 mg/dL — ABNORMAL HIGH (ref 70–99)

## 2021-09-11 LAB — BASIC METABOLIC PANEL
Anion gap: 9 (ref 5–15)
BUN: 10 mg/dL (ref 6–20)
CO2: 21 mmol/L — ABNORMAL LOW (ref 22–32)
Calcium: 8.3 mg/dL — ABNORMAL LOW (ref 8.9–10.3)
Chloride: 105 mmol/L (ref 98–111)
Creatinine, Ser: 0.78 mg/dL (ref 0.44–1.00)
GFR, Estimated: >60 mL/min (ref >60)
Glucose, Bld: 195 mg/dL — ABNORMAL HIGH (ref 70–99)
Potassium: 4.2 mmol/L (ref 3.5–5.1)
Sodium: 135 mmol/L (ref 135–145)

## 2021-09-11 LAB — CBC
HCT: 37.7 % (ref 36.0–46.0)
Hemoglobin: 11.5 g/dL — ABNORMAL LOW (ref 12.0–15.0)
MCH: 22.6 pg — ABNORMAL LOW (ref 26.0–34.0)
MCHC: 30.5 g/dL (ref 30.0–36.0)
MCV: 74.1 fL — ABNORMAL LOW (ref 80.0–100.0)
Platelets: 385 10*3/uL (ref 150–400)
RBC: 5.09 MIL/uL (ref 3.87–5.11)
RDW: 15.6 % — ABNORMAL HIGH (ref 11.5–15.5)
WBC: 15.5 10*3/uL — ABNORMAL HIGH (ref 4.0–10.5)
nRBC: 0 % (ref 0.0–0.2)

## 2021-09-11 LAB — POCT PREGNANCY, URINE: Preg Test, Ur: NEGATIVE

## 2021-09-11 SURGERY — HYSTERECTOMY, TOTAL, LAPAROSCOPIC, WITH SALPINGECTOMY
Anesthesia: General | Site: Vagina

## 2021-09-11 MED ORDER — FENTANYL CITRATE (PF) 100 MCG/2ML IJ SOLN
INTRAMUSCULAR | Status: AC
  Filled 2021-09-11: qty 2

## 2021-09-11 MED ORDER — ENOXAPARIN SODIUM 40 MG/0.4ML IJ SOSY
40.0000 mg | PREFILLED_SYRINGE | INTRAMUSCULAR | Status: AC
  Administered 2021-09-11: 40 mg via SUBCUTANEOUS

## 2021-09-11 MED ORDER — ONDANSETRON HCL 4 MG/2ML IJ SOLN
INTRAMUSCULAR | Status: AC
  Filled 2021-09-11: qty 2

## 2021-09-11 MED ORDER — AMLODIPINE BESYLATE 10 MG PO TABS
10.0000 mg | ORAL_TABLET | Freq: Every day | ORAL | Status: DC
  Administered 2021-09-11: 10 mg via ORAL
  Filled 2021-09-11: qty 1

## 2021-09-11 MED ORDER — LIDOCAINE 2% (20 MG/ML) 5 ML SYRINGE
INTRAMUSCULAR | Status: AC
  Filled 2021-09-11: qty 5

## 2021-09-11 MED ORDER — KETOROLAC TROMETHAMINE 30 MG/ML IJ SOLN
INTRAMUSCULAR | Status: AC
  Filled 2021-09-11: qty 1

## 2021-09-11 MED ORDER — FENTANYL CITRATE (PF) 100 MCG/2ML IJ SOLN
25.0000 ug | INTRAMUSCULAR | Status: DC | PRN
Start: 2021-09-11 — End: 2021-09-12
  Administered 2021-09-11 (×3): 25 ug via INTRAVENOUS

## 2021-09-11 MED ORDER — OXYCODONE-ACETAMINOPHEN 5-325 MG PO TABS
1.0000 | ORAL_TABLET | ORAL | Status: DC | PRN
  Administered 2021-09-11 (×2): 2 via ORAL
  Administered 2021-09-11: 1 via ORAL
  Administered 2021-09-12 (×2): 2 via ORAL

## 2021-09-11 MED ORDER — ONDANSETRON HCL 4 MG/2ML IJ SOLN
INTRAMUSCULAR | Status: DC | PRN
  Administered 2021-09-11: 4 mg via INTRAVENOUS

## 2021-09-11 MED ORDER — BUPIVACAINE HCL (PF) 0.25 % IJ SOLN
INTRAMUSCULAR | Status: DC | PRN
  Administered 2021-09-11: 10 mL

## 2021-09-11 MED ORDER — POVIDONE-IODINE 10 % EX SWAB: 2.0000 "application " | Freq: Once | CUTANEOUS | Status: DC

## 2021-09-11 MED ORDER — MENTHOL 3 MG MT LOZG
LOZENGE | OROMUCOSAL | Status: AC
  Filled 2021-09-11: qty 9

## 2021-09-11 MED ORDER — MIDAZOLAM HCL 2 MG/2ML IJ SOLN
INTRAMUSCULAR | Status: DC | PRN
  Administered 2021-09-11: 1 mg via INTRAVENOUS

## 2021-09-11 MED ORDER — LORATADINE 10 MG PO TABS: 10.0000 mg | ORAL_TABLET | Freq: Every day | ORAL | Status: DC

## 2021-09-11 MED ORDER — OXYCODONE-ACETAMINOPHEN 5-325 MG PO TABS
ORAL_TABLET | ORAL | Status: AC
  Filled 2021-09-11: qty 2

## 2021-09-11 MED ORDER — 0.9 % SODIUM CHLORIDE (POUR BTL) OPTIME
TOPICAL | Status: DC | PRN
  Administered 2021-09-11: 500 mL

## 2021-09-11 MED ORDER — SODIUM CHLORIDE 0.9 % IR SOLN
Status: DC | PRN
  Administered 2021-09-11: 3000 mL via INTRAVESICAL

## 2021-09-11 MED ORDER — LIDOCAINE HCL (CARDIAC) PF 100 MG/5ML IV SOSY
PREFILLED_SYRINGE | INTRAVENOUS | Status: DC | PRN
Start: 2021-09-11 — End: 2021-09-11
  Administered 2021-09-11: 60 mg via INTRAVENOUS

## 2021-09-11 MED ORDER — GLYCOPYRROLATE 0.2 MG/ML IJ SOLN
INTRAMUSCULAR | Status: DC | PRN
  Administered 2021-09-11: .1 mg via INTRAVENOUS

## 2021-09-11 MED ORDER — FENTANYL CITRATE (PF) 100 MCG/2ML IJ SOLN
INTRAMUSCULAR | Status: DC | PRN
  Administered 2021-09-11 (×7): 50 ug via INTRAVENOUS

## 2021-09-11 MED ORDER — KETOROLAC TROMETHAMINE 30 MG/ML IJ SOLN
INTRAMUSCULAR | Status: DC | PRN
  Administered 2021-09-11: 30 mg via INTRAVENOUS

## 2021-09-11 MED ORDER — SODIUM CHLORIDE 0.9% FLUSH
INTRAVENOUS | Status: DC | PRN
  Administered 2021-09-11: 50 mL via INTRAVENOUS

## 2021-09-11 MED ORDER — WHITE PETROLATUM EX OINT
TOPICAL_OINTMENT | CUTANEOUS | Status: AC
  Filled 2021-09-11: qty 5

## 2021-09-11 MED ORDER — KETOROLAC TROMETHAMINE 30 MG/ML IJ SOLN
30.0000 mg | Freq: Four times a day (QID) | INTRAMUSCULAR | Status: DC
  Administered 2021-09-11 – 2021-09-12 (×3): 30 mg via INTRAVENOUS

## 2021-09-11 MED ORDER — IBUPROFEN 800 MG PO TABS: 800.0000 mg | ORAL_TABLET | Freq: Three times a day (TID) | ORAL | Status: DC | PRN

## 2021-09-11 MED ORDER — PHENYLEPHRINE 40 MCG/ML (10ML) SYRINGE FOR IV PUSH (FOR BLOOD PRESSURE SUPPORT)
PREFILLED_SYRINGE | INTRAVENOUS | Status: AC
  Filled 2021-09-11: qty 10

## 2021-09-11 MED ORDER — LIDOCAINE-EPINEPHRINE 1 %-1:100000 IJ SOLN
INTRAMUSCULAR | Status: DC | PRN
  Administered 2021-09-11: 7 mL

## 2021-09-11 MED ORDER — SODIUM CHLORIDE 0.9 % IV SOLN
2.0000 g | INTRAVENOUS | Status: AC
  Administered 2021-09-11: 2 g via INTRAVENOUS

## 2021-09-11 MED ORDER — GABAPENTIN 300 MG PO CAPS
ORAL_CAPSULE | ORAL | Status: AC
  Filled 2021-09-11: qty 1

## 2021-09-11 MED ORDER — LACTATED RINGERS IV SOLN: INTRAVENOUS | Status: DC

## 2021-09-11 MED ORDER — ONDANSETRON HCL 4 MG PO TABS: 4.0000 mg | ORAL_TABLET | Freq: Four times a day (QID) | ORAL | Status: DC | PRN

## 2021-09-11 MED ORDER — SURGIFLO WITH THROMBIN (HEMOSTATIC MATRIX KIT) OPTIME
TOPICAL | Status: DC | PRN
  Administered 2021-09-11: 1 via TOPICAL

## 2021-09-11 MED ORDER — FENTANYL CITRATE (PF) 250 MCG/5ML IJ SOLN
INTRAMUSCULAR | Status: AC
  Filled 2021-09-11: qty 5

## 2021-09-11 MED ORDER — GABAPENTIN 300 MG PO CAPS
300.0000 mg | ORAL_CAPSULE | ORAL | Status: AC
  Administered 2021-09-11: 300 mg via ORAL

## 2021-09-11 MED ORDER — SODIUM CHLORIDE 0.9 % IV SOLN
INTRAVENOUS | Status: AC
  Filled 2021-09-11: qty 2

## 2021-09-11 MED ORDER — ROCURONIUM BROMIDE 10 MG/ML (PF) SYRINGE
PREFILLED_SYRINGE | INTRAVENOUS | Status: AC
  Filled 2021-09-11: qty 10

## 2021-09-11 MED ORDER — PROPOFOL 10 MG/ML IV BOLUS
INTRAVENOUS | Status: DC | PRN
  Administered 2021-09-11: 170 mg via INTRAVENOUS
  Administered 2021-09-11: 50 mg via INTRAVENOUS

## 2021-09-11 MED ORDER — ONDANSETRON HCL 4 MG/2ML IJ SOLN: 4.0000 mg | Freq: Four times a day (QID) | INTRAMUSCULAR | Status: DC | PRN

## 2021-09-11 MED ORDER — ROCURONIUM BROMIDE 100 MG/10ML IV SOLN
INTRAVENOUS | Status: DC | PRN
Start: 2021-09-11 — End: 2021-09-11
  Administered 2021-09-11: 20 mg via INTRAVENOUS
  Administered 2021-09-11 (×2): 10 mg via INTRAVENOUS
  Administered 2021-09-11: 20 mg via INTRAVENOUS
  Administered 2021-09-11: 60 mg via INTRAVENOUS
  Administered 2021-09-11: 10 mg via INTRAVENOUS

## 2021-09-11 MED ORDER — PROPOFOL 1000 MG/100ML IV EMUL
INTRAVENOUS | Status: AC
  Filled 2021-09-11: qty 100

## 2021-09-11 MED ORDER — MIDAZOLAM HCL 2 MG/2ML IJ SOLN
INTRAMUSCULAR | Status: AC
  Filled 2021-09-11: qty 2

## 2021-09-11 MED ORDER — MORPHINE SULFATE (PF) 4 MG/ML IV SOLN: 1.0000 mg | INTRAVENOUS | Status: DC | PRN

## 2021-09-11 MED ORDER — SUGAMMADEX SODIUM 200 MG/2ML IV SOLN
INTRAVENOUS | Status: DC | PRN
  Administered 2021-09-11: 300 mg via INTRAVENOUS

## 2021-09-11 MED ORDER — ENOXAPARIN SODIUM 40 MG/0.4ML IJ SOSY
PREFILLED_SYRINGE | INTRAMUSCULAR | Status: AC
  Filled 2021-09-11: qty 0.4

## 2021-09-11 MED ORDER — ACETAMINOPHEN 500 MG PO TABS
ORAL_TABLET | ORAL | Status: AC
  Filled 2021-09-11: qty 2

## 2021-09-11 MED ORDER — PROPOFOL 500 MG/50ML IV EMUL
INTRAVENOUS | Status: DC | PRN
  Administered 2021-09-11: 50 ug/kg/min via INTRAVENOUS

## 2021-09-11 MED ORDER — DEXMEDETOMIDINE (PRECEDEX) IN NS 20 MCG/5ML (4 MCG/ML) IV SYRINGE
PREFILLED_SYRINGE | INTRAVENOUS | Status: DC | PRN
  Administered 2021-09-11: 8 ug via INTRAVENOUS

## 2021-09-11 MED ORDER — ACETAMINOPHEN 500 MG PO TABS
1000.0000 mg | ORAL_TABLET | ORAL | Status: AC
  Administered 2021-09-11: 1000 mg via ORAL

## 2021-09-11 MED ORDER — DEXAMETHASONE SODIUM PHOSPHATE 10 MG/ML IJ SOLN
INTRAMUSCULAR | Status: AC
  Filled 2021-09-11: qty 1

## 2021-09-11 MED ORDER — SODIUM CHLORIDE 0.9 % IV SOLN
INTRAVENOUS | Status: DC | PRN
  Administered 2021-09-11: 60 mL

## 2021-09-11 MED ORDER — LORATADINE 10 MG PO TABS
10.0000 mg | ORAL_TABLET | Freq: Every day | ORAL | Status: DC
  Administered 2021-09-11: 10 mg via ORAL

## 2021-09-11 MED ORDER — SUGAMMADEX SODIUM 200 MG/2ML IV SOLN: INTRAVENOUS | Status: DC | PRN

## 2021-09-11 MED ORDER — MENTHOL 3 MG MT LOZG: 1.0000 | LOZENGE | OROMUCOSAL | Status: DC | PRN

## 2021-09-11 MED ORDER — SERTRALINE HCL 100 MG PO TABS
100.0000 mg | ORAL_TABLET | Freq: Every day | ORAL | Status: DC
  Administered 2021-09-11: 100 mg via ORAL
  Filled 2021-09-11: qty 1

## 2021-09-11 MED ORDER — PROMETHAZINE HCL 25 MG/ML IJ SOLN: 6.2500 mg | INTRAMUSCULAR | Status: DC | PRN

## 2021-09-11 MED ORDER — DEXAMETHASONE SODIUM PHOSPHATE 4 MG/ML IJ SOLN
INTRAMUSCULAR | Status: DC | PRN
  Administered 2021-09-11: 5 mg via INTRAVENOUS

## 2021-09-11 MED ORDER — LORATADINE 10 MG PO TABS
ORAL_TABLET | ORAL | Status: AC
  Filled 2021-09-11: qty 1

## 2021-09-11 MED ORDER — PHENYLEPHRINE HCL (PRESSORS) 10 MG/ML IV SOLN
INTRAVENOUS | Status: DC | PRN
  Administered 2021-09-11 (×5): 120 ug via INTRAVENOUS

## 2021-09-11 SURGICAL SUPPLY — 93 items
ADH SKN CLS APL DERMABOND .7 (GAUZE/BANDAGES/DRESSINGS) ×9
AGENT HMST KT MTR STRL THRMB (HEMOSTASIS)
APL ESCP 73.6OZ SRGCL (TIP) ×3
APL SRG 38 LTWT LNG FL B (MISCELLANEOUS)
APL SWBSTK 6 STRL LF DISP (MISCELLANEOUS) ×3
APPLICATOR ARISTA FLEXITIP XL (MISCELLANEOUS) ×1 IMPLANT
APPLICATOR COTTON TIP 6 STRL (MISCELLANEOUS) ×3 IMPLANT
APPLICATOR COTTON TIP 6IN STRL (MISCELLANEOUS) ×4
BARRIER ADHS 3X4 INTERCEED (GAUZE/BANDAGES/DRESSINGS) IMPLANT
BLADE SURG 10 STRL SS (BLADE) ×4 IMPLANT
BLADE SURG 11 STRL SS (BLADE) ×4 IMPLANT
BLADE SURG 15 STRL LF DISP TIS (BLADE) ×3 IMPLANT
BLADE SURG 15 STRL SS (BLADE) ×4
BRR ADH 4X3 ABS CNTRL BYND (GAUZE/BANDAGES/DRESSINGS)
CABLE HIGH FREQUENCY MONO STRZ (ELECTRODE) IMPLANT
CATH FOLEY 2WAY SLVR  5CC 18FR (CATHETERS) ×1
CATH FOLEY 2WAY SLVR 5CC 18FR (CATHETERS) ×3 IMPLANT
CELL SAVER LIPIGURD (MISCELLANEOUS) IMPLANT
COVER BACK TABLE 60X90IN (DRAPES) IMPLANT
COVER MAYO STAND STRL (DRAPES) ×4 IMPLANT
DECANTER SPIKE VIAL GLASS SM (MISCELLANEOUS) ×8 IMPLANT
DERMABOND ADVANCED (GAUZE/BANDAGES/DRESSINGS) ×3
DERMABOND ADVANCED .7 DNX12 (GAUZE/BANDAGES/DRESSINGS) ×9 IMPLANT
DILATOR CANAL MILEX (MISCELLANEOUS) ×4 IMPLANT
DURAPREP 26ML APPLICATOR (WOUND CARE) ×4 IMPLANT
EXTRT SYSTEM ALEXIS 14CM (MISCELLANEOUS)
EXTRT SYSTEM ALEXIS 17CM (MISCELLANEOUS)
GAUZE 4X4 16PLY ~~LOC~~+RFID DBL (SPONGE) ×8 IMPLANT
GLOVE SURG ENC MOIS LTX SZ6.5 (GLOVE) ×12 IMPLANT
GLOVE SURG LTX SZ6.5 (GLOVE) IMPLANT
GLOVE SURG UNDER POLY LF SZ7 (GLOVE) ×4 IMPLANT
GLOVE SURG UNDER POLY LF SZ7.5 (GLOVE) ×12 IMPLANT
GOWN STRL REUS W/TWL LRG LVL3 (GOWN DISPOSABLE) ×16 IMPLANT
HEMOSTAT ARISTA ABSORB 3G PWDR (HEMOSTASIS) ×1 IMPLANT
HOLDER FOLEY CATH W/STRAP (MISCELLANEOUS) ×4 IMPLANT
IV NS IRRIG 3000ML ARTHROMATIC (IV SOLUTION) ×4 IMPLANT
IV SET GRAVITY 15D 136IN (IV SETS) ×4 IMPLANT
KIT TURNOVER CYSTO (KITS) ×4 IMPLANT
LIGASURE VESSEL 5MM BLUNT TIP (ELECTROSURGICAL) ×4 IMPLANT
NEEDLE HYPO 22GX1.5 SAFETY (NEEDLE) ×4 IMPLANT
NEEDLE INSUFFLATION 120MM (ENDOMECHANICALS) ×4 IMPLANT
NS IRRIG 500ML POUR BTL (IV SOLUTION) ×4 IMPLANT
OCCLUDER COLPOPNEUMO (BALLOONS) ×4 IMPLANT
PACK LAPAROSCOPY BASIN (CUSTOM PROCEDURE TRAY) ×4 IMPLANT
PACK TRENDGUARD 450 HYBRID PRO (MISCELLANEOUS) ×3 IMPLANT
PACK VAGINAL WOMENS (CUSTOM PROCEDURE TRAY) ×4 IMPLANT
PACKING VAGINAL (PACKING) IMPLANT
POUCH LAPAROSCOPIC INSTRUMENT (MISCELLANEOUS) ×4 IMPLANT
POWDER SURGICEL 3.0 GRAM (HEMOSTASIS) ×4 IMPLANT
PROTECTOR NERVE ULNAR (MISCELLANEOUS) ×8 IMPLANT
RETRACTOR WOUND ALXS 19CM XSML (INSTRUMENTS) IMPLANT
RTRCTR WOUND ALEXIS 19CM XSML (INSTRUMENTS)
SCISSORS LAP 5X35 DISP (ENDOMECHANICALS) IMPLANT
SET IRRIG Y TYPE TUR BLADDER L (SET/KITS/TRAYS/PACK) ×4 IMPLANT
SET SUCTION IRRIG HYDROSURG (IRRIGATION / IRRIGATOR) ×8 IMPLANT
SET TRI-LUMEN FLTR TB AIRSEAL (TUBING) ×4 IMPLANT
SHEARS 1100 HARMONIC 36 (ELECTROSURGICAL) ×4 IMPLANT
SHEET LAVH (DRAPES) ×4 IMPLANT
SLEEVE ADV FIXATION 5X100MM (TROCAR) ×4 IMPLANT
SLING TVT EXACT (Sling) ×4 IMPLANT
SOL ANTI FOG 6CC (MISCELLANEOUS) ×3 IMPLANT
SOLUTION ANTI FOG 6CC (MISCELLANEOUS) ×1
SURGIFLO W/THROMBIN 8M KIT (HEMOSTASIS) IMPLANT
SUT VIC AB 0 CT1 27 (SUTURE) ×12
SUT VIC AB 0 CT1 27XBRD ANBCTR (SUTURE) ×9 IMPLANT
SUT VIC AB 2-0 CT2 27 (SUTURE) IMPLANT
SUT VIC AB 2-0 SH 27 (SUTURE) ×4
SUT VIC AB 2-0 SH 27XBRD (SUTURE) ×3 IMPLANT
SUT VIC AB 4-0 PS2 18 (SUTURE) ×4 IMPLANT
SUT VICRYL 0 UR6 27IN ABS (SUTURE) IMPLANT
SUT VLOC 180 0 9IN  GS21 (SUTURE) ×2
SUT VLOC 180 0 9IN GS21 (SUTURE) ×6 IMPLANT
SYR 10ML LL (SYRINGE) ×4 IMPLANT
SYR 50ML LL SCALE MARK (SYRINGE) ×8 IMPLANT
SYR BULB EAR ULCER 3OZ GRN STR (SYRINGE) ×4 IMPLANT
SYSTEM CARTER THOMASON II (TROCAR) IMPLANT
SYSTEM CONTND EXTRCTN KII BLLN (MISCELLANEOUS) IMPLANT
TIP ENDOSCOPIC SURGICEL (TIP) ×4 IMPLANT
TIP RUMI ORANGE 6.7MMX12CM (TIP) IMPLANT
TIP UTERINE 5.1X6CM LAV DISP (MISCELLANEOUS) IMPLANT
TIP UTERINE 6.7X10CM GRN DISP (MISCELLANEOUS) IMPLANT
TIP UTERINE 6.7X6CM WHT DISP (MISCELLANEOUS) IMPLANT
TIP UTERINE 6.7X8CM BLUE DISP (MISCELLANEOUS) ×4 IMPLANT
TOWEL OR 17X26 10 PK STRL BLUE (TOWEL DISPOSABLE) ×8 IMPLANT
TRAY FOLEY W/BAG SLVR 14FR LF (SET/KITS/TRAYS/PACK) ×4 IMPLANT
TRENDGUARD 450 HYBRID PRO PACK (MISCELLANEOUS) ×4
TROCAR ADV FIXATION 5X100MM (TROCAR) ×4 IMPLANT
TROCAR BLADELESS OPT 5 100 (ENDOMECHANICALS) ×4 IMPLANT
TROCAR PORT AIRSEAL 5X120 (TROCAR) ×4 IMPLANT
TROCAR PORT AIRSEAL 8X120 (TROCAR) IMPLANT
TROCAR XCEL NON BLADE 8MM B8LT (ENDOMECHANICALS) ×4 IMPLANT
TUBE CONNECTING 12X1/4 (SUCTIONS) ×4 IMPLANT
WARMER LAPAROSCOPE (MISCELLANEOUS) ×4 IMPLANT

## 2021-09-11 NOTE — Progress Notes (Signed)
Assessment time [id number]

## 2021-09-11 NOTE — Op Note (Signed)
OPERATIVE REPORT  PREOPERATIVE DIAGNOSES:   Fibroids Dysmenorrhea Genuine stress incontinence  POSTOPERATIVE DIAGNOSES:   Fibroids Dysmenorrhea Genuine stress incontinence Umbilical adhesions Left perihepatic adhesions. Urethral polyps.   PROCEDURES:  Total laparoscopic hysterectomy with bilateral salpingectomy, lysis of umbilical adhesions, TVT Exact midurethral sling, cystoscopy  SURGEON:  Lenard Galloway, M.D.  ASSISTANT:   Dorothy Spark, M.D.  ANESTHESIA:  General endotracheal, local with 0.25% Marcaine and 1% lidocaine with epinephrine 100,000.   IVF:  1500 cc LR  ESTIMATED BLOOD LOSS:   300 cc  URINE OUTPUT:   196 cc  COMPLICATIONS:  None.  INDICATIONS FOR THE PROCEDURE:     The patient is a 45 year old Gravida 4, Para 67 African American female who presents with heavy and regular menstrual cycles, dysmenorrhea and urinary incontinence with coughing and sneezing.   She had fibroids on pelvic ultrasound and her urodynamic testing confirmed stress incontinence.   She has had a tubal ligation and an endometrial ablation and she is requesting hysterectomy and surgical treatment of her urinary incontinence.   A plan is made to proceed with a total laparoscopic hysterectomy with bilateral salpingectomy, possible bilateral oophorectomy, TVT Exact midurethral sling, and cystoscopy after risks, benefits, and alternatives are reviewed.  FINDINGS:     Laparoscopy revealed a normal uterus and ovaries.  Her tubes were consistent with bilateral tubal ligation. 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  There were omental adhesions under the umbilical region, and these were removed with the harmonic scalpel.   No endometriosis was seen in the abdomen or pelvis.    Cystoscopy at the termination of the hysterectomy procedure and during the midurethral sling showed the bladder to be normal  throughout 360 degrees including the bladder dome and trigone. There was no evidence of any foreign body in the bladder or the urethra. There was no evidence of any lesions of the bladder.  There were urethral polyps noted.  Both of the ureters were noted to be patent bilaterally.  SPECIMENS:     The uterus, cervix, and bilateral tubes were went to pathology.  The uterus weighed 300 grams.   DESCRIPTION OF PROCEDURE:    The patient was reidentified in the preoperative hold area.   She received Cefotetan IV for antibiotic prophylaxis and Lovenox, TED hose and PAS stockings for DVT prophylaxis.  In the operating room, the patient was placed in the dorsal lithotomy position on the operating room table.  The Trendguard was used to support her on the OR table.  Her legs were placed in the [place] stirrups and her arms were both padded and tucked at her sides. The patient received general endotracheal anesthesia. The abdomen and vagina were then sterilely prepped and she was sterilely draped.  A speculum was placed in the vagina and a single-tooth tenaculum was placed on the anterior cervical lip.  A figure-of-eight suture of 0 Vicryl was placed on each the anterior and the posterior cervical lips. The uterus was sounded to 8 cm.  The cervix was then dilated with Hegar dilators.  A #  8 RUMI tip with a small metal KOH ring was then placed through the cervix and into the uterine cavity. The tenaculum was moved to the posterior cervix to help maintain the position of the RUMI device on the cervix.  The remaining vaginal instruments were then removed.  A Foley catheter was placed inside the bladder.  Attention was turned to the abdomen where the umbilical region was injected with 0.25% Marcaine and a small incision created.  A Veress needle was then used to insufflate the abdomen with CO2 gas after a saline drop test was performed and the fluid flowed freely.  A 5 mm umbilical incision was created with a scalpel  after the skin.  A 5 mm camera port was then placed using the Optiview.  An 8 mm incisions was then created in the left mid abdomen and a 5 mm incision the left lower abdomen after the skin was injected locally with 0.25% Marcaine.  Respectively sized trocars were then placed under visualization of the laparoscope.  A 5 mm Air Seal trocar was placed in the right mid abdomen after injecting with Marcaine and incising with a scalpel.    The patient was placed in Trendelenburg position.  An inspection of the abdomen and pelvis was performed. The findings are as noted above.   The umbilical adhesions were lysed with the Ligasure instrument.   The left fallopian tube was grasped at its fimbriated end.  The mesosalpinx was cauterized and cut with the Ligasure instrument.  The specimen was detached and removed from the peritoneal cavity.  The left round ligament and left utero-ovarian ligament were then cauterized and divided with same instrument.  Bleeding occurred along the left lateral uterus near the round ligament.  This responded to a figure of eight suture of 0 V lock after which time hemostasis of the area was good.  Dissection was performed to the anterior leaf of the broad ligament using the Harmonic scalpel.    Attention was turned to the patient's right-hand side at this time.  The right distal fallopian tube was grasped and the Ligasure was used to cauterize and cut the mesosalpinx. This fallopian tube was detached and removed from the peritoneal cavity.  The right utero-ovarian and then the right round ligaments were cauterized and cut with the same instrument. The anterior leaf of the broad ligament was dissected with the Harmonic Scalpel.    The incision was carried along the vesicouterine fold and the bladder was dissected away from the cervix using the Harmonic scalpel.   The bladder was retrograde filled with normal saline to outline the bladder.   The uterine arteries were skeletonized with  the the Harmonic scalpel and cauterized and cut with the Ligasure.    The KOH ring was nicely visible.  The colpotomy incision was performed with the Harmonic scalpel in a circumferential fashion.   The uterine specimen with the Rumi device taken down though the colpotomy incision and into the vagina. The Rumi and tenaculum were detached.  The uterine specimen would not pass through the colpotomy incision due to its size.  A weighted speculum and deaver retractors were therefore placed in the vagina, and the cervix and uterus were morcellated with a scalpel. The specimen was weighed as 300 grams and was sent to Pathology.  One fallopian tube was sent with the uterine specimen and cervix and the other fallopian tube was sent to Pathology separately.   The balloon occluder was placed in the vagina, and the vaginal cuff was sutured laparoscopically using a running suture of 0 V-Loc.  The vagina was closed from the patient's right hand side to the left hand side and then back 2 sutures towards the midline.  This provided good full-thickness closure of the vaginal cuff.  The laparoscopic needle for suturing was removed from the peritoneal cavity.  The pelvis was irrigated and suctioned.   The pneumoperitoneal was let down.  There was good hemostasis of the operative sites and pedicles.  Surgicel powder was placed in the pelvis over the operating sites and the powder was rinsed and residual powder suctioned after one minute of application time.   The CO2 pneumoperitoneum was released and the patient received manual breaths any remaining gas.    The trocars were removed and the incisions were closed with subcuticular sutures of 4-0 Vicryl.  Dermabond was placed over the incisions.  The vaginal balloon occluder was removed.    The patient's Foley catheter was removed and cystoscopy was performed.  The findings are as noted above.  The Foley catheter was replaced.  Final inspection of the vagina  demonstrated good hemostasis of the vaginal cuff.  The TVT Exact midurethral sling was performed next.  Allis clamps were used to mark the anterior vaginal wall from the urethral and down a distance of 3 cm.  The anterior vaginal wall mucosa was injected locally with 1% lidocaine with epinephrine, 1:100,000.  The vaginal mucosa was then incised vertically in the midline with the scalpel.  With a combination of sharp and blunt dissection, the subvaginal tissue was dissected off the bladder bilaterally.  The dissection was carried back to the pubic rami anteriorly.   The TVT Exact midurethral sling was performed.  The 1 cm suprapubic incisions were created with a scalpel to the right and left of the midline.  The TVT Exact was performed in a bottom-up fashion.  The Foley catheter was removed and the Foley tip with the obturator guide was placed inside the urethra and deflected properly.  The guide was placed through the right retropubic space and then up through the right suprapubic incision.  This was performed without difficulty.  The urethra was deflected in opposite direction and the same was then performed on the patient's left-hand side.  The obturator guide was removed and cystoscopy was performed and the findings were as noted above.  All cystoscopic fluid was drained and the Foley catheter was replaced. The sling was brought up through the suprapubic incisions bilaterally.  A Kelly clamp was placed between the sling and the urethra, and the plastic sheaths were removed.  The sling was trimmed suprapubically.  The sling was noted to be in good position.  Surgiflow was placed over the surgical field.   Hemostasis was good.  The anterior vaginal wall was closed with a running locked suture of 2-0 Vicryl.  The suprapubic incisions were closed with Dermabond.  This concluded the patient's procedure.  She was awakened and extubated, and escorted to the recovery room in stable condition.  There were no  complications.  All needle, instrument, and sponge counts were correct.  An MD assistant was necessary for tissue manipulation, management of instrumentation, retraction and positioning due to the complexity of the case and uterine size.   Lenard Galloway, M.D.

## 2021-09-11 NOTE — Anesthesia Procedure Notes (Signed)
Procedure Name: Intubation Date/Time: 09/11/2021 7:47 AM Performed by: Georgeanne Nim, CRNA Pre-anesthesia Checklist: Patient identified, Emergency Drugs available, Suction available, Patient being monitored and Timeout performed Patient Re-evaluated:Patient Re-evaluated prior to induction Oxygen Delivery Method: Circle system utilized Preoxygenation: Pre-oxygenation with 100% oxygen Induction Type: IV induction Ventilation: Oral airway inserted - appropriate to patient size and Mask ventilation without difficulty Laryngoscope Size: Mac and 4 Grade View: Grade II Tube type: Oral Tube size: 7.0 mm Number of attempts: 1 Airway Equipment and Method: Stylet Placement Confirmation: ETT inserted through vocal cords under direct vision, positive ETCO2, CO2 detector and breath sounds checked- equal and bilateral Secured at: 20 cm Tube secured with: Tape Dental Injury: Teeth and Oropharynx as per pre-operative assessment  Difficulty Due To: Difficulty was anticipated

## 2021-09-11 NOTE — Transfer of Care (Signed)
Immediate Anesthesia Transfer of Care Note  Patient: Therapist, art  Procedure(s) Performed: TOTAL LAPAROSCOPIC HYSTERECTOMY WITH SALPINGECTOMY (Bilateral: Abdomen) CYSTOSCOPY (Bladder) TRANSVAGINAL TAPE (TVT) PROCEDURE (Vagina )  Patient Location: PACU  Anesthesia Type:General  Level of Consciousness: drowsy and patient cooperative  Airway & Oxygen Therapy: Patient Spontanous Breathing and Patient connected to nasal cannula oxygen  Post-op Assessment: Report given to RN and Post -op Vital signs reviewed and stable  Post vital signs: Reviewed and stable  Last Vitals:  Vitals Value Taken Time  BP 126/86 09/11/21 1215  Temp    Pulse 97 09/11/21 1216  Resp 18 09/11/21 1216  SpO2 94 % 09/11/21 1216  Vitals shown include unvalidated device data.  Last Pain:  Vitals:   09/11/21 0555  TempSrc: Oral  PainSc: 0-No pain      Patients Stated Pain Goal: 6 (53/91/22 5834)  Complications: No notable events documented.

## 2021-09-11 NOTE — Progress Notes (Signed)
Day of Surgery Procedure(s) (LRB): TOTAL LAPAROSCOPIC HYSTERECTOMY WITH SALPINGECTOMY (Bilateral) CYSTOSCOPY (N/A) TRANSVAGINAL TAPE (TVT) PROCEDURE (N/A)  Subjective: Patient reports tolerating PO.   Ambulating.  Pain controlled with percocet.  Due to Toradol about 6:00 pm.  No void yet.  Feels much better since her Foley catheter was removed at 4:00 pm.   Objective: I have reviewed patient's vital signs and intake and output. Vitals:   09/11/21 1445 09/11/21 1730  BP: 132/86 (!) 151/94  Pulse: 90 (!) 102  Resp: 16 18  Temp:    SpO2: 94% 93%   I/O - 1720 cc/1150 cc.  Gen:  NAD.  Alert and cooperative.  Lungs:  CTA bilaterally.  Cor:  S1S2 tachycardia.  Abd:  Positive bowel sounds, protuberant, nontender.  Umbilical incision with dressing on and evidence of minor bleeding.  Other incisions with dermabond and no bleeding.  Vaginal pad:  mild blood noted.  Ext:  PAS and Ted hose on.   Assessment: s/p Procedure(s): TOTAL LAPAROSCOPIC HYSTERECTOMY WITH SALPINGECTOMY (Bilateral) CYSTOSCOPY (N/A) TRANSVAGINAL TAPE (TVT) PROCEDURE (N/A): stable  Plan: Regular diet. Voiding trial.  If no void by 20:00, place foley catheter overnight.  Percocet and Toradol or Motrin for pain.  CBC now.  Continue in hospital care overnight with anticipated discharge to home in the am.  Surgical findings and procedure discussed with patient.  Questions answered.  LOS: 0 days    Arloa Koh 09/11/2021, 5:59 PM

## 2021-09-11 NOTE — Progress Notes (Signed)
Update to History and Physical  No marked change in status since office preop visit.  No questions regarding surgery.   States she saw her EKG report but did not know what it meant.  She denies history of MI.  Glucose this am 185.  Last hemoglobin A1C 6.1 on 08/28/21.   Patient examined.  Vitals:   09/11/21 0555  BP: (!) 149/93  Pulse: (!) 101  Resp: 17  Temp: 98.9 F (37.2 C)  SpO2: 99%    EKG ST changes, cannot rule out anterior MI, age undetermined.   OK to proceed with surgery.

## 2021-09-11 NOTE — Anesthesia Postprocedure Evaluation (Signed)
Anesthesia Post Note  Patient: Therapist, art  Procedure(s) Performed: TOTAL LAPAROSCOPIC HYSTERECTOMY WITH SALPINGECTOMY (Bilateral: Abdomen) CYSTOSCOPY (Bladder) TRANSVAGINAL TAPE (TVT) PROCEDURE (Vagina )     Patient location during evaluation: PACU Anesthesia Type: General Level of consciousness: awake and alert Pain management: pain level controlled Vital Signs Assessment: post-procedure vital signs reviewed and stable Respiratory status: spontaneous breathing, nonlabored ventilation, respiratory function stable and patient connected to nasal cannula oxygen Cardiovascular status: blood pressure returned to baseline and stable Postop Assessment: no apparent nausea or vomiting Anesthetic complications: no   No notable events documented.  Last Vitals:  Vitals:   09/11/21 1415 09/11/21 1445  BP: (!) 141/99 132/86  Pulse: 89 90  Resp: 18 16  Temp:    SpO2: 92% 94%    Last Pain:  Vitals:   09/11/21 1345  TempSrc:   PainSc: 7                  Blong Busk P Shandrika Ambers

## 2021-09-12 ENCOUNTER — Encounter (HOSPITAL_BASED_OUTPATIENT_CLINIC_OR_DEPARTMENT_OTHER): Payer: Self-pay | Admitting: Obstetrics and Gynecology

## 2021-09-12 ENCOUNTER — Other Ambulatory Visit: Payer: Self-pay | Admitting: Obstetrics and Gynecology

## 2021-09-12 ENCOUNTER — Telehealth: Payer: Self-pay

## 2021-09-12 DIAGNOSIS — D259 Leiomyoma of uterus, unspecified: Secondary | ICD-10-CM | POA: Diagnosis not present

## 2021-09-12 DIAGNOSIS — Z79899 Other long term (current) drug therapy: Secondary | ICD-10-CM | POA: Diagnosis not present

## 2021-09-12 DIAGNOSIS — R7303 Prediabetes: Secondary | ICD-10-CM | POA: Diagnosis not present

## 2021-09-12 DIAGNOSIS — K660 Peritoneal adhesions (postprocedural) (postinfection): Secondary | ICD-10-CM | POA: Diagnosis not present

## 2021-09-12 DIAGNOSIS — F17210 Nicotine dependence, cigarettes, uncomplicated: Secondary | ICD-10-CM | POA: Diagnosis not present

## 2021-09-12 DIAGNOSIS — Z8719 Personal history of other diseases of the digestive system: Secondary | ICD-10-CM | POA: Diagnosis not present

## 2021-09-12 DIAGNOSIS — N393 Stress incontinence (female) (male): Secondary | ICD-10-CM | POA: Diagnosis not present

## 2021-09-12 DIAGNOSIS — N838 Other noninflammatory disorders of ovary, fallopian tube and broad ligament: Secondary | ICD-10-CM | POA: Diagnosis not present

## 2021-09-12 DIAGNOSIS — N368 Other specified disorders of urethra: Secondary | ICD-10-CM | POA: Diagnosis not present

## 2021-09-12 LAB — CBC
HCT: 34 % — ABNORMAL LOW (ref 36.0–46.0)
Hemoglobin: 10.5 g/dL — ABNORMAL LOW (ref 12.0–15.0)
MCH: 22.9 pg — ABNORMAL LOW (ref 26.0–34.0)
MCHC: 30.9 g/dL (ref 30.0–36.0)
MCV: 74.1 fL — ABNORMAL LOW (ref 80.0–100.0)
Platelets: 348 10*3/uL (ref 150–400)
RBC: 4.59 MIL/uL (ref 3.87–5.11)
RDW: 15.8 % — ABNORMAL HIGH (ref 11.5–15.5)
WBC: 14.8 10*3/uL — ABNORMAL HIGH (ref 4.0–10.5)
nRBC: 0 % (ref 0.0–0.2)

## 2021-09-12 LAB — BASIC METABOLIC PANEL
Anion gap: 8 (ref 5–15)
BUN: 10 mg/dL (ref 6–20)
CO2: 23 mmol/L (ref 22–32)
Calcium: 8 mg/dL — ABNORMAL LOW (ref 8.9–10.3)
Chloride: 106 mmol/L (ref 98–111)
Creatinine, Ser: 0.83 mg/dL (ref 0.44–1.00)
GFR, Estimated: >60 mL/min (ref >60)
Glucose, Bld: 128 mg/dL — ABNORMAL HIGH (ref 70–99)
Potassium: 3.8 mmol/L (ref 3.5–5.1)
Sodium: 137 mmol/L (ref 135–145)

## 2021-09-12 MED ORDER — OXYCODONE-ACETAMINOPHEN 5-325 MG PO TABS
ORAL_TABLET | ORAL | Status: AC
  Filled 2021-09-12: qty 2

## 2021-09-12 MED ORDER — IBUPROFEN 800 MG PO TABS
800.0000 mg | ORAL_TABLET | Freq: Three times a day (TID) | ORAL | 0 refills | Status: DC | PRN
Start: 2021-09-12 — End: 2021-09-29

## 2021-09-12 MED ORDER — KETOROLAC TROMETHAMINE 30 MG/ML IJ SOLN
INTRAMUSCULAR | Status: AC
  Filled 2021-09-12: qty 1

## 2021-09-12 MED ORDER — OXYCODONE-ACETAMINOPHEN 5-325 MG PO TABS: 1.0000 | ORAL_TABLET | ORAL | 0 refills | Status: DC | PRN

## 2021-09-12 MED ORDER — OXYCODONE-ACETAMINOPHEN 5-325 MG PO TABS
1.0000 | ORAL_TABLET | ORAL | 0 refills | Status: DC | PRN
Start: 2021-09-12 — End: 2021-09-12

## 2021-09-12 NOTE — Telephone Encounter (Signed)
I spoke with [facility]. 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  I cancelled the Rx at that pharmacy with Vivien Rota.  I called [facility] [address] that is convenient to patient and that pharmacy does have it in stock.  I will send new Rx to Dr. Quincy Simmonds to re-sign/send.

## 2021-09-12 NOTE — Progress Notes (Signed)
1 Day Post-Op Procedure(s) (LRB): TOTAL LAPAROSCOPIC HYSTERECTOMY WITH SALPINGECTOMY (Bilateral) CYSTOSCOPY (N/A) TRANSVAGINAL TAPE (TVT) PROCEDURE (N/A)  Subjective: Patient reports tolerating PO, + flatus, and no problems voiding.    Objective: I have reviewed patient's vital signs, intake and output, and labs. Vitals:   09/12/21 0215 09/12/21 0615  BP: 112/77 121/73  Pulse: 93 95  Resp: 18 20  Temp: 98.5 F (36.9 C) 98.1 F (36.7 C)  SpO2: 94% 92%   I/O - 3741 cc/2350 cc  CBC    Component Value Date/Time   WBC 14.8 (H) 09/12/2021 0215   RBC 4.59 09/12/2021 0215   HGB 10.5 (L) 09/12/2021 0215   HGB 13.6 12/23/2020 1528   HCT 34.0 (L) 09/12/2021 0215   HCT 43.5 12/23/2020 1528   PLT 348 09/12/2021 0215   PLT 375 12/23/2020 1528   MCV 74.1 (L) 09/12/2021 0215   MCV 73 (L) 12/23/2020 1528   MCH 22.9 (L) 09/12/2021 0215   MCHC 30.9 09/12/2021 0215   RDW 15.8 (H) 09/12/2021 0215   RDW 14.8 12/23/2020 1528   LYMPHSABS 1,581 04/06/2021 1648   LYMPHSABS 1.4 12/30/2018 1648   MONOABS 0.9 08/14/2014 0628   EOSABS 194 04/06/2021 1648   EOSABS 0.2 12/30/2018 1648   BASOSABS 20 04/06/2021 1648   BASOSABS 0.0 12/30/2018 1648    Glucose 128 Ca 8.0  Physical exam Gen:  NAD.  Alert. Talkative.  Lungs:  CTA bilaterally.  Cor:  S1S2 RRR. Abdomen:  active bowel sounds.  Protruberant, nontender.  Incisions clean, dry, and intact.  Vag Pad:  none on.   Assessment: s/p Procedure(s): TOTAL LAPAROSCOPIC HYSTERECTOMY WITH SALPINGECTOMY (Bilateral) CYSTOSCOPY (N/A) TRANSVAGINAL TAPE (TVT) PROCEDURE (N/A): progressing well  Plan: Discharge home Rx for Percocet and Motrin.  Post surgical instructions to patient in verbal and written form.  Fu in one week.    LOS: 0 days    Arloa Koh 09/12/2021, 7:45 AM

## 2021-09-12 NOTE — Telephone Encounter (Signed)
Patient called in voice mail. She states that pharmacy does not have the prescription and does not know when they will have it.  I called her back to inquire which Rx (appears Dr. Quincy Simmonds sent oxycodone-acet and Ibuprofen) and what other pharmacy to send it .

## 2021-09-12 NOTE — Telephone Encounter (Signed)
Patient informed. Rx sent 

## 2021-09-12 NOTE — Discharge Instructions (Signed)
Brianna Gutierrez,   You did well with surgery!  I will see you in one week for your post op appointment.   [name], MD

## 2021-09-13 LAB — SURGICAL PATHOLOGY

## 2021-09-13 NOTE — Discharge Summary (Signed)
Physician Discharge Summary  Patient ID: Brianna Gutierrez MRN: [id number] DOB/AGE: [birth year] 45 y.o.  Admit date: 09/11/2021 Discharge date: 09/12/21  Admission Diagnoses: Fibroids Dysmenorrhea Genuine stress incontinence  Discharge Diagnoses:  Fibroids Dysmenorrhea Genuine stress incontinence Umbilical adhesions Left perihepatic adhesions. Urethral polyps.  Status post total laparoscopic hysterectomy with bilateral salpingectomy, lysis of umbilical adhesions, TVT Exact midurethral sling, cystoscopy  Active Problems:   Status post laparoscopic hysterectomy   Discharged Condition: good  Hospital Course: The patient was admitted on 09/11/21 for a total laparoscopic hysterectomy with bilateral salpingectomy, lysis of umbilical adhesions, TVT Exact midurethral sling, cystoscopy which were performed without complication while under general anesthesia.  The patient's post op course was uneventful.  She had received Toradol a d Percocet for pain control.  She tolerated a regular diet following surgery.   She ambulated independently and wore PAS and Ted hose for DVT prophylaxis while in bed.  Her foley catheter were removed on post op day zero, and she voided good volumes. The patient's vital signs remained stable and she demonstrated no signs of infection during her hospitalization.  The patient's post op day zero WBC was 15.5 and her hemoglobin was 11.5. The elevated white blood cell count was attributed to surgical dissection.  Her glucose was 195 the evening of surgery.  The following day, her WBC was 14.8 and her hemoglobin was 10.5.  Her preop A1C was noted to be 6.1. She had very minimal vaginal bleeding, and her incisions demonstrated no signs of erythema or significant drainage.  She was found to be in good condition and ready for discharge on post op day one.  Consults: None  Significant Diagnostic Studies: labs:  See Hospital Course.  Treatments: surgery:  total laparoscopic  hysterectomy with bilateral salpingectomy, lysis of umbilical adhesions, TVT Exact midurethral sling, cystoscopy  Discharge Exam: Blood pressure 121/73, pulse 92, temperature 98.6 F (37 C), resp. rate 18, height 5\' 2"  (1.575 m), weight 91.3 kg, last menstrual period 08/18/2021, SpO2 93 %. Gen:  NAD.  Alert. Talkative.  Lungs:  CTA bilaterally.  Cor:  S1S2 RRR. Abdomen:  active bowel sounds.  Protruberant, nontender.  Incisions clean, dry, and intact.  Vag Pad:  none on.   Disposition: Discharge disposition: 01-Home or Self Care     Discharge instructions were reviewed in verbal and written form.    Allergies as of 09/12/2021       Reactions   Tramadol Other (See Comments)   Headache        Medication List     TAKE these medications    amLODipine 10 MG tablet Commonly known as: NORVASC Take 1 tablet (10 mg total) by mouth daily. What changed: when to take this   calcium carbonate 500 MG chewable tablet Commonly known as: TUMS - dosed in mg elemental calcium Chew 1 tablet by mouth as needed for indigestion or heartburn.   cetirizine 10 MG tablet Commonly known as: ZYRTEC Take 1 tablet (10 mg total) by mouth daily. What changed: when to take this   ibuprofen 800 MG tablet Commonly known as: ADVIL Take 1 tablet (800 mg total) by mouth every 8 (eight) hours as needed for mild pain. Notes to patient: Can take a dose at 2pm   levocetirizine 5 MG tablet Commonly known as: XYZAL Take 5 mg by mouth at bedtime.   sertraline 100 MG tablet Commonly known as: Zoloft Take 1 tablet (100 mg total) by mouth daily. What changed: when to take this  Percocet 5 mg/325 mg, take 1 by mouth, every 4 hours as needed for pain.   Follow-up Information     [name], MD Follow up in 1 week(s).   Specialty: Obstetrics and Gynecology Contact information: [address] [place] [address] [phone]                  Signed: Arloa Koh 09/13/2021, 7:59 PM

## 2021-09-14 ENCOUNTER — Telehealth: Payer: Self-pay | Admitting: *Deleted

## 2021-09-14 ENCOUNTER — Encounter: Payer: Self-pay | Admitting: Obstetrics and Gynecology

## 2021-09-14 ENCOUNTER — Other Ambulatory Visit: Payer: Self-pay

## 2021-09-14 ENCOUNTER — Ambulatory Visit: Payer: BC Managed Care – PPO | Admitting: Obstetrics and Gynecology

## 2021-09-14 ENCOUNTER — Ambulatory Visit (INDEPENDENT_AMBULATORY_CARE_PROVIDER_SITE_OTHER): Payer: BC Managed Care – PPO | Admitting: Obstetrics and Gynecology

## 2021-09-14 VITALS — BP 140/82 | HR 90 | Ht 62.0 in | Wt 199.0 lb

## 2021-09-14 DIAGNOSIS — Z90710 Acquired absence of both cervix and uterus: Secondary | ICD-10-CM

## 2021-09-14 DIAGNOSIS — R102 Pelvic and perineal pain: Secondary | ICD-10-CM | POA: Diagnosis not present

## 2021-09-14 MED ORDER — SULFAMETHOXAZOLE-TRIMETHOPRIM 800-160 MG PO TABS: ORAL_TABLET | ORAL | 0 refills | Status: DC

## 2021-09-14 NOTE — Progress Notes (Signed)
GYNECOLOGY  VISIT   HPI: 45 y.o.   Married  [place] American  female   G4P0 with Patient's last menstrual period was 08/18/2021 (exact date).   here for 3 days status post TOTAL LAPAROSCOPIC HYSTERECTOMY WITH SALPINGECTOMY (Bilateral: Abdomen)  CYSTOSCOPY (Bladder)  TRANSVAGINAL TAPE (TVT) PROCEDURE (Vagina ).  Patient complaining of pelvic pressure. Feels as if bladder emptying small amounts. She lasted voided about 2 hour ago.  Having bowel movements.   Left incision is sore.   Not much vaginal bleeding.   Alternating Percocet and Motrin.  GYNECOLOGIC HISTORY: Patient's last menstrual period was 08/18/2021 (exact date). Contraception:  Tubal/Hyst Menopausal hormone therapy:  none Last mammogram:   04/2021 normal per patient/[facility] Last pap smear:    03-03-21 Neg:Neg HR HPV, 11-13-16 Neg:Neg HR HPV        OB History     Gravida  4   Para      Term      Preterm      AB      Living  4      SAB      IAB      Ectopic      Multiple      Live Births  4              Patient Active Problem List   Diagnosis Date Noted   Status post laparoscopic hysterectomy 09/11/2021   Cholelithiasis 01/13/2019   Pre-diabetes 11/15/2016   Uterine fibroid 04/27/2016   Abnormal uterine bleeding (AUB) 09/28/2015   History of bilateral tubal ligation 09/28/2015   Tobacco abuse 09/28/2015    Past Medical History:  Diagnosis Date   Abnormal Pap smear of cervix    Anxiety    Depression    Dysmenorrhea    Eczema    GERD (gastroesophageal reflux disease)    History of COVID-19 10/24/2020   loss of taste and smellbody aches and cough x 4 days all symptoms resolved   Hypertension    Pre-diabetes    Rash 08/31/2021   comes and goes on legs saw dermetology no rash now ? food allergy   SUI (stress urinary incontinence, female)    Trichomonas infection 02/2021   Uterine fibroid     Past Surgical History:  Procedure Laterality Date   BLADDER SUSPENSION N/A 09/11/2021    Procedure: TRANSVAGINAL TAPE (TVT) PROCEDURE;  Surgeon: [name], MD;  Location: [place];  Service: Gynecology;  Laterality: N/A;   CESAREAN SECTION  1997   x 1   CHOLECYSTECTOMY N/A 01/14/2019   Procedure: LAPAROSCOPIC CHOLECYSTECTOMY WITH POSSIBLE  INTRAOPERATIVE CHOLANGIOGRAM;  Surgeon: Armandina Gemma, MD;  Location: [facility];  Service: General;  Laterality: N/A;   CYSTOSCOPY N/A 09/11/2021   Procedure: Consuela Mimes;  Surgeon: [name], MD;  Location: [facility];  Service: Gynecology;  Laterality: N/A;   DILITATION & CURRETTAGE/HYSTROSCOPY WITH HYDROTHERMAL ABLATION N/A 06/22/2016   Procedure: DILATATION & CURETTAGE/HYSTEROSCOPY WITH HYDROTHERMAL ABLATION;  Surgeon: Shelly Bombard, MD;  Location: [facility];  Service: Gynecology;  Laterality: N/A;   TOTAL LAPAROSCOPIC HYSTERECTOMY WITH SALPINGECTOMY Bilateral 09/11/2021   Procedure: TOTAL LAPAROSCOPIC HYSTERECTOMY WITH SALPINGECTOMY;  Surgeon: [name], MD;  Location: [facility];  Service: Gynecology;  Laterality: Bilateral;   TUBAL LIGATION     09-28-2015    Current Outpatient Medications  Medication Sig Dispense Refill   amLODipine (NORVASC) 10 MG tablet Take 1 tablet (10 mg  total) by mouth daily. (Patient taking differently: Take 10 mg by mouth at bedtime.) 90 tablet 0   cetirizine (ZYRTEC) 10 MG tablet Take 1 tablet (10 mg total) by mouth daily. (Patient taking differently: Take 10 mg by mouth at bedtime.) 30 tablet 4   ibuprofen (ADVIL) 800 MG tablet Take 1 tablet (800 mg total) by mouth every 8 (eight) hours as needed for mild pain. 30 tablet 0   levocetirizine (XYZAL) 5 MG tablet Take 5 mg by mouth at bedtime.     oxyCODONE-acetaminophen (PERCOCET) 5-325 MG tablet Take 1 tablet by mouth every 4 (four) hours as needed. use only as much as needed to relieve pain 20 tablet 0   sertraline (ZOLOFT) 100 MG tablet Take 1 tablet (100 mg total) by  mouth daily. (Patient taking differently: Take 100 mg by mouth at bedtime.) 90 tablet 0   calcium carbonate (TUMS - DOSED IN MG ELEMENTAL CALCIUM) 500 MG chewable tablet Chew 1 tablet by mouth as needed for indigestion or heartburn.     No current facility-administered medications for this visit.     ALLERGIES: Tramadol  Family History  Problem Relation Age of Onset   Hypertension Maternal Grandmother    Diabetes Maternal Grandfather    Colon cancer Neg Hx    Esophageal cancer Neg Hx    Rectal cancer Neg Hx    Stomach cancer Neg Hx     Social History   Socioeconomic History   Marital status: Married    Spouse name: Not on file   Number of children: Not on file   Years of education: Not on file   Highest education level: Not on file  Occupational History   Not on file  Tobacco Use   Smoking status: Every Day    Packs/day: 0.10    Years: 20.00    Pack years: 2.00    Types: Cigarettes   Smokeless tobacco: Never  Vaping Use   Vaping Use: Never used  Substance and Sexual Activity   Alcohol use: Not Currently   Drug use: Never   Sexual activity: Not Currently    Partners: Male    Birth control/protection: Surgical    Comment: BTL  Other Topics Concern   Not on file  Social History Narrative   Not on file   Social Determinants of Health   Financial Resource Strain: Not on file  Food Insecurity: Not on file  Transportation Needs: Not on file  Physical Activity: Not on file  Stress: Not on file  Social Connections: Not on file  Intimate Partner Violence: Not on file    Review of Systems  Genitourinary:  Positive for pelvic pain (low pelvic pressure).       Bladder emptying small amounts at a time  All other systems reviewed and are negative.  PHYSICAL EXAMINATION:    BP 140/82   Pulse 90   Ht 5\' 2"  (1.575 m)   Wt 199 lb (90.3 kg)   LMP 08/18/2021 (Exact Date)   SpO2 95%   BMI 36.40 kg/m     General appearance: alert, cooperative and appears stated  age   Abdomen: active bowel sounds, protruberant, non-tender, no masses,  no organomegaly Mildly tender incisions, all intact.    Pelvic: External genitalia:  no lesions              Urethra:  normal appearing urethra with no masses, tenderness or lesions  Bartholins and Skenes: normal                 Vagina: normal appearing vagina with normal color and discharge, no lesions              Cervix: absent.                  Bimanual Exam:  Uterus:  absent.  Suture lines of anterior vaginal and vaginal cuff are intact.              Adnexa: no mass, fullness, tenderness         Void:  150 cc.   Catheter specimen urine: Verbal consent for procedure.  Sterile prep with betadine.  Catheterized for 16 cc.   Chaperone was present for exam:  Estill Bamberg, CMA  ASSESSMENT  Pelvic pressure Status post total laparoscopic hysterectomy, bilateral salpingectomy, and midurethral sling.  Voiding well.   PLAN  Urinalysis and reflex culture from cath urine:  sg 1.020, ph 6.5, trace protein, negative glucose, 0 - 5 WBC, 3 - 10 RBC, 6 0- 10 squams, few bacteria.   UC sent.  Start Bactrim DS po bid x 7 days.  Hydrate well.  Keep appointment for next week for 1 week post op check.  Will recheck her glucose then.    An After Visit Summary was printed and given to the patient.

## 2021-09-14 NOTE — Telephone Encounter (Signed)
Patient called post [place] on 09/11/21 called today c/o pelvic pressure that is painful when sitting only. No fever, states the feeling started overnight, and she ws up and down to bathroom all night no burning with urination, states she sits on the toilet for a "while" to urinate, small amounts come out, states her bladder doesn't feel empty. Report her stomach feels achy as well, she is taking oxycodone 5/325 mg tablet and alternating with ibuprofen. She had a large bowel movement yesterday, passing gas. I recommended OV patient scheduled today at 11:00am.

## 2021-09-16 LAB — URINALYSIS, COMPLETE W/RFL CULTURE
Bilirubin Urine: NEGATIVE
Glucose, UA: NEGATIVE
Hyaline Cast: NONE SEEN /LPF
Ketones, ur: NEGATIVE
Leukocyte Esterase: NEGATIVE
Nitrites, Initial: NEGATIVE
Specific Gravity, Urine: 1.02 (ref 1.001–1.035)
pH: 6.5 (ref 5.0–8.0)

## 2021-09-16 LAB — CULTURE INDICATED

## 2021-09-16 LAB — URINE CULTURE
MICRO NUMBER:: 12651463
Result:: NO GROWTH
SPECIMEN QUALITY:: ADEQUATE

## 2021-09-18 ENCOUNTER — Encounter: Payer: Self-pay | Admitting: Obstetrics and Gynecology

## 2021-09-18 ENCOUNTER — Ambulatory Visit (INDEPENDENT_AMBULATORY_CARE_PROVIDER_SITE_OTHER): Payer: BC Managed Care – PPO | Admitting: Obstetrics and Gynecology

## 2021-09-18 ENCOUNTER — Other Ambulatory Visit: Payer: Self-pay

## 2021-09-18 VITALS — BP 112/80 | HR 97

## 2021-09-18 DIAGNOSIS — R7309 Other abnormal glucose: Secondary | ICD-10-CM

## 2021-09-18 DIAGNOSIS — Z90710 Acquired absence of both cervix and uterus: Secondary | ICD-10-CM

## 2021-09-18 NOTE — Progress Notes (Signed)
GYNECOLOGY  VISIT   HPI: 45 y.o.   Married  [place] American  female   G4P0 with Patient's last menstrual period was 08/18/2021 (exact date).   here for 1 week post op. S/p TLH w/ salpingectomy on 09/11/21. Reports spotting and feels as if she can feel a tickle suprapubically. Also reports gas pains. Desires repeat glucose check. Glucose was elevated in hospital.  Her last A1C was 6.1.  Passing flatus.  Had a BM this morning.   Still on Bactrim DS.  Her final UC was negative on 09/14/21.   Voiding well.   GYNECOLOGIC HISTORY: Patient's last menstrual period was 08/18/2021 (exact date). Contraception:  Hysterectomy Menopausal hormone therapy:  none Last mammogram:  09/05/21- neg birads 1 Last pap smear:   03/03/21- WNL, HPV- neg        OB History     Gravida  4   Para      Term      Preterm      AB      Living  4      SAB      IAB      Ectopic      Multiple      Live Births  4              Patient Active Problem List   Diagnosis Date Noted   Status post laparoscopic hysterectomy 09/11/2021   Cholelithiasis 01/13/2019   Pre-diabetes 11/15/2016   Uterine fibroid 04/27/2016   Abnormal uterine bleeding (AUB) 09/28/2015   History of bilateral tubal ligation 09/28/2015   Tobacco abuse 09/28/2015    Past Medical History:  Diagnosis Date   Abnormal Pap smear of cervix    Anxiety    Depression    Dysmenorrhea    Eczema    GERD (gastroesophageal reflux disease)    History of COVID-19 10/24/2020   loss of taste and smellbody aches and cough x 4 days all symptoms resolved   Hypertension    Pre-diabetes    Rash 08/31/2021   comes and goes on legs saw dermetology no rash now ? food allergy   SUI (stress urinary incontinence, female)    Trichomonas infection 02/2021   Uterine fibroid     Past Surgical History:  Procedure Laterality Date   BLADDER SUSPENSION N/A 09/11/2021   Procedure: TRANSVAGINAL TAPE (TVT) PROCEDURE;  Surgeon: [name], MD;  Location: [place];  Service: Gynecology;  Laterality: N/A;   CESAREAN SECTION  1997   x 1   CHOLECYSTECTOMY N/A 01/14/2019   Procedure: LAPAROSCOPIC CHOLECYSTECTOMY WITH POSSIBLE  INTRAOPERATIVE CHOLANGIOGRAM;  Surgeon: Armandina Gemma, MD;  Location: [facility];  Service: General;  Laterality: N/A;   CYSTOSCOPY N/A 09/11/2021   Procedure: Consuela Mimes;  Surgeon: [name], MD;  Location: [facility];  Service: Gynecology;  Laterality: N/A;   DILITATION & CURRETTAGE/HYSTROSCOPY WITH HYDROTHERMAL ABLATION N/A 06/22/2016   Procedure: DILATATION & CURETTAGE/HYSTEROSCOPY WITH HYDROTHERMAL ABLATION;  Surgeon: Shelly Bombard, MD;  Location: [facility];  Service: Gynecology;  Laterality: N/A;   TOTAL LAPAROSCOPIC HYSTERECTOMY WITH SALPINGECTOMY Bilateral 09/11/2021   Procedure: TOTAL LAPAROSCOPIC HYSTERECTOMY WITH SALPINGECTOMY;  Surgeon: [name], MD;  Location: [facility];  Service: Gynecology;  Laterality: Bilateral;   TUBAL LIGATION     09-28-2015    Current Outpatient Medications  Medication Sig Dispense Refill   amLODipine (NORVASC) 10 MG tablet Take 1 tablet (10 mg  total) by mouth daily. (Patient taking differently: Take 10 mg by mouth at bedtime.) 90 tablet 0   cetirizine (ZYRTEC) 10 MG tablet Take 1 tablet (10 mg total) by mouth daily. (Patient taking differently: Take 10 mg by mouth at bedtime.) 30 tablet 4   ibuprofen (ADVIL) 800 MG tablet Take 1 tablet (800 mg total) by mouth every 8 (eight) hours as needed for mild pain. 30 tablet 0   levocetirizine (XYZAL) 5 MG tablet Take 5 mg by mouth at bedtime.     oxyCODONE-acetaminophen (PERCOCET) 5-325 MG tablet Take 1 tablet by mouth every 4 (four) hours as needed. use only as much as needed to relieve pain 20 tablet 0   sertraline (ZOLOFT) 100 MG tablet Take 1 tablet (100 mg total) by mouth daily. (Patient taking differently: Take 100 mg by mouth at bedtime.)  90 tablet 0   sulfamethoxazole-trimethoprim (BACTRIM DS) 800-160 MG tablet Take one tablet by mouth twice a day for 7 days. 14 tablet 0   No current facility-administered medications for this visit.     ALLERGIES: Tramadol  Family History  Problem Relation Age of Onset   Hypertension Maternal Grandmother    Diabetes Maternal Grandfather    Colon cancer Neg Hx    Esophageal cancer Neg Hx    Rectal cancer Neg Hx    Stomach cancer Neg Hx     Social History   Socioeconomic History   Marital status: Married    Spouse name: Not on file   Number of children: Not on file   Years of education: Not on file   Highest education level: Not on file  Occupational History   Not on file  Tobacco Use   Smoking status: Every Day    Packs/day: 0.10    Years: 20.00    Pack years: 2.00    Types: Cigarettes   Smokeless tobacco: Never  Vaping Use   Vaping Use: Never used  Substance and Sexual Activity   Alcohol use: Not Currently   Drug use: Never   Sexual activity: Not Currently    Partners: Male    Birth control/protection: Surgical    Comment: TLH 09/11/21  Other Topics Concern   Not on file  Social History Narrative   Not on file   Social Determinants of Health   Financial Resource Strain: Not on file  Food Insecurity: Not on file  Transportation Needs: Not on file  Physical Activity: Not on file  Stress: Not on file  Social Connections: Not on file  Intimate Partner Violence: Not on file    Review of Systems  PHYSICAL EXAMINATION:    BP 112/80 (BP Location: Right Arm, Patient Position: Sitting, Cuff Size: Large)   Pulse 97   LMP 08/18/2021 (Exact Date)   SpO2 97%     General appearance: alert, cooperative and appears stated age   Abdomen: abdominal incisions without dermabond.  Umbilical and right sided incision with slight separation of the skin. Abdomen is soft, non-tender, no masses,  no organomegaly   Pelvic: External genitalia:  SP incisions with dermabond  present and intact.               Urethra:  normal appearing urethra with no masses, tenderness or lesions              Bartholins and Skenes: normal  Bimanual Exam:  Uterus:  absent.  Anterior vaginal incision and vaginal cuff incision intact.  Sling is protected.               Adnexa: no mass, fullness, tenderness          Chaperone was present for exam:  Lawson Radar, RN  ASSESSMENT  Doing well post op.  Negative urine culture.  Hx elevated glucose.   PLAN  Ok to discontinue Bactrim DS.  Continue decreased activity but regular ambulation is important.  Fu for 6 week post op visit.    An After Visit Summary was printed and given to the patient.

## 2021-09-19 LAB — GLUCOSE, RANDOM: Glucose, Plasma: 106 mg/dL (ref 65–139)

## 2021-09-24 ENCOUNTER — Encounter (HOSPITAL_COMMUNITY): Payer: Self-pay | Admitting: Emergency Medicine

## 2021-09-24 ENCOUNTER — Other Ambulatory Visit: Payer: Self-pay

## 2021-09-24 ENCOUNTER — Inpatient Hospital Stay (HOSPITAL_COMMUNITY)
Admission: EM | Admit: 2021-09-24 | Discharge: 2021-09-29 | DRG: 862 | Disposition: A | Discharge status: Home / Self Care | Payer: BC Managed Care – PPO | Attending: Internal Medicine | Admitting: Internal Medicine

## 2021-09-24 ENCOUNTER — Emergency Department (HOSPITAL_COMMUNITY): Payer: BC Managed Care – PPO

## 2021-09-24 DIAGNOSIS — F17210 Nicotine dependence, cigarettes, uncomplicated: Secondary | ICD-10-CM | POA: Diagnosis present

## 2021-09-24 DIAGNOSIS — N390 Urinary tract infection, site not specified: Secondary | ICD-10-CM | POA: Diagnosis present

## 2021-09-24 DIAGNOSIS — G8918 Other acute postprocedural pain: Secondary | ICD-10-CM | POA: Diagnosis present

## 2021-09-24 DIAGNOSIS — J9811 Atelectasis: Secondary | ICD-10-CM | POA: Diagnosis present

## 2021-09-24 DIAGNOSIS — N739 Female pelvic inflammatory disease, unspecified: Secondary | ICD-10-CM | POA: Diagnosis present

## 2021-09-24 DIAGNOSIS — Z8249 Family history of ischemic heart disease and other diseases of the circulatory system: Secondary | ICD-10-CM

## 2021-09-24 DIAGNOSIS — Z6835 Body mass index (BMI) 35.0-35.9, adult: Secondary | ICD-10-CM | POA: Diagnosis not present

## 2021-09-24 DIAGNOSIS — I700 Atherosclerosis of aorta: Secondary | ICD-10-CM | POA: Diagnosis present

## 2021-09-24 DIAGNOSIS — Z9049 Acquired absence of other specified parts of digestive tract: Secondary | ICD-10-CM

## 2021-09-24 DIAGNOSIS — K567 Ileus, unspecified: Secondary | ICD-10-CM | POA: Diagnosis not present

## 2021-09-24 DIAGNOSIS — K651 Peritoneal abscess: Secondary | ICD-10-CM | POA: Diagnosis not present

## 2021-09-24 DIAGNOSIS — N281 Cyst of kidney, acquired: Secondary | ICD-10-CM | POA: Diagnosis present

## 2021-09-24 DIAGNOSIS — T8143XA Infection following a procedure, organ and space surgical site, initial encounter: Secondary | ICD-10-CM | POA: Diagnosis not present

## 2021-09-24 DIAGNOSIS — Z833 Family history of diabetes mellitus: Secondary | ICD-10-CM

## 2021-09-24 DIAGNOSIS — E669 Obesity, unspecified: Secondary | ICD-10-CM | POA: Diagnosis present

## 2021-09-24 DIAGNOSIS — N200 Calculus of kidney: Secondary | ICD-10-CM | POA: Diagnosis not present

## 2021-09-24 DIAGNOSIS — L02211 Cutaneous abscess of abdominal wall: Secondary | ICD-10-CM | POA: Diagnosis not present

## 2021-09-24 DIAGNOSIS — N270 Small kidney, unilateral: Secondary | ICD-10-CM | POA: Diagnosis present

## 2021-09-24 DIAGNOSIS — K769 Liver disease, unspecified: Secondary | ICD-10-CM | POA: Diagnosis not present

## 2021-09-24 DIAGNOSIS — R7303 Prediabetes: Secondary | ICD-10-CM | POA: Diagnosis present

## 2021-09-24 DIAGNOSIS — I2699 Other pulmonary embolism without acute cor pulmonale: Secondary | ICD-10-CM | POA: Diagnosis not present

## 2021-09-24 DIAGNOSIS — K219 Gastro-esophageal reflux disease without esophagitis: Secondary | ICD-10-CM | POA: Diagnosis present

## 2021-09-24 DIAGNOSIS — R109 Unspecified abdominal pain: Secondary | ICD-10-CM | POA: Diagnosis not present

## 2021-09-24 DIAGNOSIS — Z20822 Contact with and (suspected) exposure to covid-19: Secondary | ICD-10-CM | POA: Diagnosis present

## 2021-09-24 DIAGNOSIS — Z9889 Other specified postprocedural states: Secondary | ICD-10-CM | POA: Diagnosis not present

## 2021-09-24 DIAGNOSIS — Z8616 Personal history of COVID-19: Secondary | ICD-10-CM

## 2021-09-24 DIAGNOSIS — N271 Small kidney, bilateral: Secondary | ICD-10-CM | POA: Diagnosis not present

## 2021-09-24 DIAGNOSIS — F419 Anxiety disorder, unspecified: Secondary | ICD-10-CM | POA: Diagnosis present

## 2021-09-24 DIAGNOSIS — Z90711 Acquired absence of uterus with remaining cervical stump: Secondary | ICD-10-CM

## 2021-09-24 DIAGNOSIS — Z885 Allergy status to narcotic agent status: Secondary | ICD-10-CM

## 2021-09-24 DIAGNOSIS — R739 Hyperglycemia, unspecified: Secondary | ICD-10-CM | POA: Diagnosis present

## 2021-09-24 DIAGNOSIS — K7689 Other specified diseases of liver: Secondary | ICD-10-CM | POA: Diagnosis not present

## 2021-09-24 DIAGNOSIS — Z5181 Encounter for therapeutic drug level monitoring: Secondary | ICD-10-CM | POA: Diagnosis not present

## 2021-09-24 DIAGNOSIS — I2602 Saddle embolus of pulmonary artery with acute cor pulmonale: Secondary | ICD-10-CM | POA: Diagnosis not present

## 2021-09-24 DIAGNOSIS — E876 Hypokalemia: Secondary | ICD-10-CM | POA: Diagnosis not present

## 2021-09-24 DIAGNOSIS — Z79899 Other long term (current) drug therapy: Secondary | ICD-10-CM

## 2021-09-24 DIAGNOSIS — F32A Depression, unspecified: Secondary | ICD-10-CM | POA: Diagnosis present

## 2021-09-24 DIAGNOSIS — I10 Essential (primary) hypertension: Secondary | ICD-10-CM | POA: Diagnosis present

## 2021-09-24 LAB — CBC WITH DIFFERENTIAL/PLATELET
Abs Immature Granulocytes: 0.12 10*3/uL — ABNORMAL HIGH (ref 0.00–0.07)
Basophils Absolute: 0 10*3/uL (ref 0.0–0.1)
Basophils Relative: 0 %
Eosinophils Absolute: 0.8 10*3/uL — ABNORMAL HIGH (ref 0.0–0.5)
Eosinophils Relative: 6 %
HCT: 39 % (ref 36.0–46.0)
Hemoglobin: 12 g/dL (ref 12.0–15.0)
Immature Granulocytes: 1 %
Lymphocytes Relative: 15 %
Lymphs Abs: 2.1 10*3/uL (ref 0.7–4.0)
MCH: 23 pg — ABNORMAL LOW (ref 26.0–34.0)
MCHC: 30.8 g/dL (ref 30.0–36.0)
MCV: 74.9 fL — ABNORMAL LOW (ref 80.0–100.0)
Monocytes Absolute: 1.2 10*3/uL — ABNORMAL HIGH (ref 0.1–1.0)
Monocytes Relative: 9 %
Neutro Abs: 9.4 10*3/uL — ABNORMAL HIGH (ref 1.7–7.7)
Neutrophils Relative %: 69 %
Platelets: 512 10*3/uL — ABNORMAL HIGH (ref 150–400)
RBC: 5.21 MIL/uL — ABNORMAL HIGH (ref 3.87–5.11)
RDW: 16.4 % — ABNORMAL HIGH (ref 11.5–15.5)
WBC: 13.5 10*3/uL — ABNORMAL HIGH (ref 4.0–10.5)
nRBC: 0 % (ref 0.0–0.2)

## 2021-09-24 LAB — COMPREHENSIVE METABOLIC PANEL
ALT: 23 U/L (ref 0–44)
AST: 39 U/L (ref 15–41)
Albumin: 3.6 g/dL (ref 3.5–5.0)
Alkaline Phosphatase: 114 U/L (ref 38–126)
Anion gap: 7 (ref 5–15)
BUN: 16 mg/dL (ref 6–20)
CO2: 23 mmol/L (ref 22–32)
Calcium: 8.6 mg/dL — ABNORMAL LOW (ref 8.9–10.3)
Chloride: 105 mmol/L (ref 98–111)
Creatinine, Ser: 0.76 mg/dL (ref 0.44–1.00)
GFR, Estimated: >60 mL/min (ref >60)
Glucose, Bld: 105 mg/dL — ABNORMAL HIGH (ref 70–99)
Potassium: 5.5 mmol/L — ABNORMAL HIGH (ref 3.5–5.1)
Sodium: 135 mmol/L (ref 135–145)
Total Bilirubin: 1.2 mg/dL (ref 0.3–1.2)
Total Protein: 7.7 g/dL (ref 6.5–8.1)

## 2021-09-24 LAB — URINALYSIS, ROUTINE W REFLEX MICROSCOPIC
Bilirubin Urine: NEGATIVE
Glucose, UA: NEGATIVE mg/dL
Ketones, ur: NEGATIVE mg/dL
Nitrite: NEGATIVE
Protein, ur: NEGATIVE mg/dL
Specific Gravity, Urine: 1.013 (ref 1.005–1.030)
WBC, UA: >50 WBC/hpf — ABNORMAL HIGH (ref 0–5)
pH: 7 (ref 5.0–8.0)

## 2021-09-24 MED ORDER — IOHEXOL 350 MG/ML SOLN
80.0000 mL | Freq: Once | INTRAVENOUS | Status: AC | PRN
  Administered 2021-09-24: 21:00:00 80 mL via INTRAVENOUS

## 2021-09-24 MED ORDER — ONDANSETRON HCL 4 MG/2ML IJ SOLN
4.0000 mg | Freq: Four times a day (QID) | INTRAMUSCULAR | Status: DC | PRN
  Administered 2021-09-25 – 2021-09-29 (×5): 4 mg via INTRAVENOUS
  Filled 2021-09-24 (×5): qty 2

## 2021-09-24 MED ORDER — LEVOCETIRIZINE DIHYDROCHLORIDE 5 MG PO TABS: 5.0000 mg | ORAL_TABLET | Freq: Every day | ORAL | Status: DC

## 2021-09-24 MED ORDER — PRENATAL MULTIVITAMIN CH
1.0000 | ORAL_TABLET | Freq: Every day | ORAL | Status: DC
  Administered 2021-09-25 – 2021-09-29 (×5): 1 via ORAL
  Filled 2021-09-24 (×5): qty 1

## 2021-09-24 MED ORDER — OXYCODONE-ACETAMINOPHEN 5-325 MG PO TABS
1.0000 | ORAL_TABLET | ORAL | Status: DC | PRN
  Administered 2021-09-25 – 2021-09-26 (×3): 2 via ORAL
  Administered 2021-09-26: 1 via ORAL
  Administered 2021-09-27 (×4): 2 via ORAL
  Filled 2021-09-24: qty 1
  Filled 2021-09-24 (×7): qty 2

## 2021-09-24 MED ORDER — PIPERACILLIN-TAZOBACTAM 3.375 G IVPB 30 MIN: 3.3750 g | Freq: Three times a day (TID) | INTRAVENOUS | Status: DC

## 2021-09-24 MED ORDER — DOCUSATE SODIUM 100 MG PO CAPS
100.0000 mg | ORAL_CAPSULE | Freq: Two times a day (BID) | ORAL | Status: DC
  Administered 2021-09-24 – 2021-09-28 (×8): 100 mg via ORAL
  Filled 2021-09-24 (×8): qty 1

## 2021-09-24 MED ORDER — MORPHINE SULFATE (PF) 4 MG/ML IV SOLN
4.0000 mg | Freq: Once | INTRAVENOUS | Status: AC
  Administered 2021-09-24: 19:00:00 4 mg via INTRAVENOUS
  Filled 2021-09-24: qty 1

## 2021-09-24 MED ORDER — PIPERACILLIN-TAZOBACTAM 3.375 G IVPB
3.3750 g | Freq: Three times a day (TID) | INTRAVENOUS | Status: DC
  Administered 2021-09-24 – 2021-09-25 (×2): 3.375 g via INTRAVENOUS
  Filled 2021-09-24 (×2): qty 50

## 2021-09-24 MED ORDER — AMLODIPINE BESYLATE 10 MG PO TABS
10.0000 mg | ORAL_TABLET | Freq: Every day | ORAL | Status: DC
  Administered 2021-09-24 – 2021-09-28 (×5): 10 mg via ORAL
  Filled 2021-09-24 (×4): qty 1

## 2021-09-24 MED ORDER — SODIUM CHLORIDE 0.9 % IV BOLUS
1000.0000 mL | Freq: Once | INTRAVENOUS | Status: AC
  Administered 2021-09-24: 19:00:00 1000 mL via INTRAVENOUS

## 2021-09-24 MED ORDER — ALUM & MAG HYDROXIDE-SIMETH 200-200-20 MG/5ML PO SUSP: 30.0000 mL | ORAL | Status: DC | PRN

## 2021-09-24 MED ORDER — ONDANSETRON HCL 4 MG/2ML IJ SOLN
4.0000 mg | Freq: Once | INTRAMUSCULAR | Status: AC
  Administered 2021-09-24: 19:00:00 4 mg via INTRAVENOUS
  Filled 2021-09-24: qty 2

## 2021-09-24 MED ORDER — SERTRALINE HCL 100 MG PO TABS
100.0000 mg | ORAL_TABLET | Freq: Every day | ORAL | Status: DC
  Administered 2021-09-24 – 2021-09-28 (×5): 100 mg via ORAL
  Filled 2021-09-24 (×4): qty 1
  Filled 2021-09-24: qty 2

## 2021-09-24 MED ORDER — SIMETHICONE 80 MG PO CHEW
80.0000 mg | CHEWABLE_TABLET | Freq: Four times a day (QID) | ORAL | Status: DC | PRN
  Administered 2021-09-25 – 2021-09-27 (×2): 80 mg via ORAL
  Filled 2021-09-24 (×3): qty 1

## 2021-09-24 MED ORDER — LORATADINE 10 MG PO TABS
10.0000 mg | ORAL_TABLET | Freq: Every day | ORAL | Status: DC
  Administered 2021-09-24 – 2021-09-28 (×5): 10 mg via ORAL
  Filled 2021-09-24 (×5): qty 1

## 2021-09-24 MED ORDER — IBUPROFEN 800 MG PO TABS: 800.0000 mg | ORAL_TABLET | Freq: Three times a day (TID) | ORAL | Status: DC | PRN

## 2021-09-24 MED ORDER — MORPHINE SULFATE (PF) 2 MG/ML IV SOLN
1.0000 mg | INTRAVENOUS | Status: DC | PRN
  Administered 2021-09-24 – 2021-09-25 (×2): 2 mg via INTRAVENOUS
  Filled 2021-09-24 (×2): qty 1

## 2021-09-24 MED ORDER — ONDANSETRON HCL 4 MG PO TABS: 4.0000 mg | ORAL_TABLET | Freq: Four times a day (QID) | ORAL | Status: DC | PRN

## 2021-09-24 NOTE — H&P (Signed)
Brianna Gutierrez is an 45 y.o. female. S/p TLH, BS, LOA, TVT Exact sling, cystoscopy on 09/11/21 with Dr Quincy Simmonds.   Patient reports the last two days, pain in the pelvis has been increasing- feels like pressure and sharp pulling with standing. She has had decreased appetite and nausea. Having regular bowel movements, last was today.  Denies fever or chills. Has only had occasional spotting for vaginal bleeding. She has been taking ibuprofen and percocet but this did not relieve her pain.   Of note, was on bactrim for possible UTI after being seen in the office on 11/21. But stopped antibiotics after culture was negative. Denies dysuria.      Past Medical History:  Diagnosis Date   Abnormal Pap smear of cervix    Anxiety    Depression    Dysmenorrhea    Eczema    GERD (gastroesophageal reflux disease)    History of COVID-19 10/24/2020   loss of taste and smellbody aches and cough x 4 days all symptoms resolved   Hypertension    Pre-diabetes    Rash 08/31/2021   comes and goes on legs saw dermetology no rash now ? food allergy   SUI (stress urinary incontinence, female)    Trichomonas infection 02/2021   Uterine fibroid     Past Surgical History:  Procedure Laterality Date   BLADDER SUSPENSION N/A 09/11/2021   Procedure: TRANSVAGINAL TAPE (TVT) PROCEDURE;  Surgeon: [name], MD;  Location: [facility];  Service: Gynecology;  Laterality: N/A;   CESAREAN SECTION  1997   x 1   CHOLECYSTECTOMY N/A 01/14/2019   Procedure: LAPAROSCOPIC CHOLECYSTECTOMY WITH POSSIBLE  INTRAOPERATIVE CHOLANGIOGRAM;  Surgeon: Armandina Gemma, MD;  Location: [facility];  Service: General;  Laterality: N/A;   CYSTOSCOPY N/A 09/11/2021   Procedure: Consuela Mimes;  Surgeon: [name], MD;  Location: [facility];  Service: Gynecology;  Laterality: N/A;   DILITATION & CURRETTAGE/HYSTROSCOPY WITH HYDROTHERMAL ABLATION N/A 06/22/2016   Procedure: DILATATION &  CURETTAGE/HYSTEROSCOPY WITH HYDROTHERMAL ABLATION;  Surgeon: Shelly Bombard, MD;  Location: [facility];  Service: Gynecology;  Laterality: N/A;   TOTAL LAPAROSCOPIC HYSTERECTOMY WITH SALPINGECTOMY Bilateral 09/11/2021   Procedure: TOTAL LAPAROSCOPIC HYSTERECTOMY WITH SALPINGECTOMY;  Surgeon: [name], MD;  Location: [facility];  Service: Gynecology;  Laterality: Bilateral;   TUBAL LIGATION     09-28-2015    Family History  Problem Relation Age of Onset   Hypertension Maternal Grandmother    Diabetes Maternal Grandfather    Colon cancer Neg Hx    Esophageal cancer Neg Hx    Rectal cancer Neg Hx    Stomach cancer Neg Hx     Social History:  reports that she has been smoking cigarettes. She has a 2.00 pack-year smoking history. She has never used smokeless tobacco. She reports that she does not currently use alcohol. She reports that she does not use drugs.  Allergies:  Allergies  Allergen Reactions   Tramadol Other (See Comments)    Headache     Review of Systems  Constitutional:  Negative for chills and fever.  Respiratory:  Negative for cough and shortness of breath.   Cardiovascular:  Negative for chest pain.  Gastrointestinal:  Positive for abdominal pain and nausea. Negative for constipation.  Genitourinary:  Negative for dysuria.  Musculoskeletal:  Negative for gait problem and myalgias.  Neurological:  Negative for dizziness.   Blood pressure 121/78, pulse 93, temperature 98.5  F (36.9 C), temperature source Oral, resp. rate 16, last menstrual period 08/18/2021, SpO2 99 %. Physical Exam Constitutional:      Appearance: Normal appearance.  HENT:     Head: Normocephalic.  Cardiovascular:     Rate and Rhythm: Normal rate and regular rhythm.  Pulmonary:     Effort: Pulmonary effort is normal.     Breath sounds: Normal breath sounds. No wheezing.  Abdominal:     General: There is distension.     Palpations: Abdomen is soft.      Comments: Laparoscopic incisions intact, healing well  Genitourinary:    General: Normal vulva.     Vagina: No vaginal discharge.     Comments: Speculum exam reveals no discharge, intact vaginal cuff without cellulitis. Suburethral sutures not intact and mesh exposure noted. Slight bleeding noted in this area.  Musculoskeletal:        General: No swelling or tenderness.     Cervical back: Normal range of motion.  Skin:    General: Skin is warm and dry.  Neurological:     Mental Status: She is alert.  Psychiatric:        Mood and Affect: Mood normal.        Behavior: Behavior normal.    Results for orders placed or performed during the hospital encounter of 09/24/21 (from the past 24 hour(s))  Comprehensive metabolic panel     Status: Abnormal   Collection Time: 09/24/21  7:25 PM  Result Value Ref Range   Sodium 135 135 - 145 mmol/L   Potassium 5.5 (H) 3.5 - 5.1 mmol/L   Chloride 105 98 - 111 mmol/L   CO2 23 22 - 32 mmol/L   Glucose, Bld 105 (H) 70 - 99 mg/dL   BUN 16 6 - 20 mg/dL   Creatinine, Ser 0.76 0.44 - 1.00 mg/dL   Calcium 8.6 (L) 8.9 - 10.3 mg/dL   Total Protein 7.7 6.5 - 8.1 g/dL   Albumin 3.6 3.5 - 5.0 g/dL   AST 39 15 - 41 U/L   ALT 23 0 - 44 U/L   Alkaline Phosphatase 114 38 - 126 U/L   Total Bilirubin 1.2 0.3 - 1.2 mg/dL   GFR, Estimated >60 >60 mL/min   Anion gap 7 5 - 15  CBC with Differential     Status: Abnormal   Collection Time: 09/24/21  7:25 PM  Result Value Ref Range   WBC 13.5 (H) 4.0 - 10.5 K/uL   RBC 5.21 (H) 3.87 - 5.11 MIL/uL   Hemoglobin 12.0 12.0 - 15.0 g/dL   HCT 39.0 36.0 - 46.0 %   MCV 74.9 (L) 80.0 - 100.0 fL   MCH 23.0 (L) 26.0 - 34.0 pg   MCHC 30.8 30.0 - 36.0 g/dL   RDW 16.4 (H) 11.5 - 15.5 %   Platelets 512 (H) 150 - 400 K/uL   nRBC 0.0 0.0 - 0.2 %   Neutrophils Relative % 69 %   Neutro Abs 9.4 (H) 1.7 - 7.7 K/uL   Lymphocytes Relative 15 %   Lymphs Abs 2.1 0.7 - 4.0 K/uL   Monocytes Relative 9 %   Monocytes Absolute 1.2 (H)  0.1 - 1.0 K/uL   Eosinophils Relative 6 %   Eosinophils Absolute 0.8 (H) 0.0 - 0.5 K/uL   Basophils Relative 0 %   Basophils Absolute 0.0 0.0 - 0.1 K/uL   Immature Granulocytes 1 %   Abs Immature Granulocytes 0.12 (H) 0.00 - 0.07 K/uL  Urinalysis, Routine w reflex microscopic Urine, Clean Catch     Status: Abnormal   Collection Time: 09/24/21  8:30 PM  Result Value Ref Range   Color, Urine YELLOW YELLOW   APPearance HAZY (A) CLEAR   Specific Gravity, Urine 1.013 1.005 - 1.030   pH 7.0 5.0 - 8.0   Glucose, UA NEGATIVE NEGATIVE mg/dL   Hgb urine dipstick MODERATE (A) NEGATIVE   Bilirubin Urine NEGATIVE NEGATIVE   Ketones, ur NEGATIVE NEGATIVE mg/dL   Protein, ur NEGATIVE NEGATIVE mg/dL   Nitrite NEGATIVE NEGATIVE   Leukocytes,Ua LARGE (A) NEGATIVE   RBC / HPF 21-50 0 - 5 RBC/hpf   WBC, UA >50 (H) 0 - 5 WBC/hpf   Bacteria, UA RARE (A) NONE SEEN   Squamous Epithelial / LPF 6-10 0 - 5   Mucus PRESENT     CT Abdomen Pelvis W Contrast  Result Date: 09/24/2021 CLINICAL DATA:  Abdominal pain, fever, postop. Hysterectomy 1 week ago. EXAM: CT ABDOMEN AND PELVIS WITH CONTRAST TECHNIQUE: Multidetector CT imaging of the abdomen and pelvis was performed using the standard protocol following bolus administration of intravenous contrast. CONTRAST:  104mL OMNIPAQUE IOHEXOL 350 MG/ML SOLN COMPARISON:  None. FINDINGS: Lower chest: Strandy opacities are noted at the lung bases, possible atelectasis or infiltrate. Hepatobiliary: There is an ill-defined hypodensity in the left lobe of the liver hepatic segment 2 measuring 1.8 cm with indeterminate imaging characteristics. No biliary ductal dilatation. The gallbladder is surgically absent Pancreas: Unremarkable. No pancreatic ductal dilatation or surrounding inflammatory changes. Spleen: Normal in size without focal abnormality. Adrenals/Urinary Tract: No adrenal nodule or mass. Right renal atrophy and cortical scarring are noted. There is a delayed nephrogram  on the right. A nonobstructive 7 mm calculus is present in the left kidney. No hydronephrosis is seen bilaterally. An extrarenal pelvis is noted on the right. The bladder is within normal limits. Stomach/Bowel: No bowel obstruction, free air or pneumatosis. A normal appendix is seen in the right lower quadrant the stomach is unremarkable. No focal bowel wall thickening. Vascular/Lymphatic: No significant vascular findings are present. No enlarged abdominal or pelvic lymph nodes. Reproductive: The uterus is surgically absent. There are slightly complex fluid collections in the surgical bed and left adnexa measuring 2.6 x 1.2 cm and 5.3 by 6.2. Small cystic structures are noted in the left ovary, likely representing follicles. Other: A fat containing anterior abdominal wall hernia is noted to the right of midline superior to the umbilicus. Musculoskeletal: No acute osseous abnormality. IMPRESSION: 1. Status post hysterectomy with slightly complex free fluid in the surgical bed and left adnexa, possible residual blood products versus developing abscess. 2. Nonobstructive left renal calculus. 3. Right renal atrophy with cortical scarring. 4. Indeterminate hypodensity in the left lobe of the liver. Multiphase CT or MRI suggested for further characterization on follow-up. 5. Strandy opacities at the lung bases, possible atelectasis or infiltrate. Electronically Signed   By: Brett Fairy M.D.   On: 09/24/2021 21:33    Assessment/Plan: 45yo F S/p TLH, BS, LOA, TVT Exact sling, cystoscopy on 09/11/21 with abdominal pain and pelvic abscess  - pain control: ibuprofen, percocet, morphine prn - For possible pelvic abscess, will start broad spectrum antibiotics- Zosyn q8hrs - Will trend WBC with daily CBC - regular diet - zofran prn for nausea - bowel regimen: simethicone and colace - HTN- home amlodipine ordered - ambulation and SCDs for ppx  Jaquita Folds 09/24/2021, 10:53 PM

## 2021-09-24 NOTE — ED Triage Notes (Signed)
PT c/o worsening abdominal pain x2 days. Hysterectomy x1 week ago.

## 2021-09-24 NOTE — ED Provider Notes (Signed)
[facility] DEPT Provider Note   CSN: [id number] Arrival date & time: 09/24/21  1828     History Chief Complaint  Patient presents with   Post-op Problem    Brianna Gutierrez is a 45 y.o. female.  Patient is a 45 year old female who presents with abdominal pain.  She is status post a total laparoscopic hysterectomy and salpingectomy on November 14 by Dr. Quincy Simmonds.  She had a follow-up appointment on November 21 and was doing well.  She previously been on Bactrim and this was discontinued at that visit.  She said over the last 2 days she has had worsening lower abdominal pain.  She has had some nausea but no vomiting.  No known fevers.  No urinary symptoms.  No vaginal bleeding or discharge.  She has been using ibuprofen for pain without improvement in symptoms.      Past Medical History:  Diagnosis Date   Abnormal Pap smear of cervix    Anxiety    Depression    Dysmenorrhea    Eczema    GERD (gastroesophageal reflux disease)    History of COVID-19 10/24/2020   loss of taste and smellbody aches and cough x 4 days all symptoms resolved   Hypertension    Pre-diabetes    Rash 08/31/2021   comes and goes on legs saw dermetology no rash now ? food allergy   SUI (stress urinary incontinence, female)    Trichomonas infection 02/2021   Uterine fibroid     Patient Active Problem List   Diagnosis Date Noted   Status post laparoscopic hysterectomy 09/11/2021   Cholelithiasis 01/13/2019   Pre-diabetes 11/15/2016   History of bilateral tubal ligation 09/28/2015   Tobacco abuse 09/28/2015    Past Surgical History:  Procedure Laterality Date   BLADDER SUSPENSION N/A 09/11/2021   Procedure: TRANSVAGINAL TAPE (TVT) PROCEDURE;  Surgeon: [name], MD;  Location: [facility];  Service: Gynecology;  Laterality: N/A;   CESAREAN SECTION  1997   x 1   CHOLECYSTECTOMY N/A 01/14/2019   Procedure: LAPAROSCOPIC CHOLECYSTECTOMY WITH  POSSIBLE  INTRAOPERATIVE CHOLANGIOGRAM;  Surgeon: Armandina Gemma, MD;  Location: [facility];  Service: General;  Laterality: N/A;   CYSTOSCOPY N/A 09/11/2021   Procedure: Consuela Mimes;  Surgeon: [name], MD;  Location: [facility];  Service: Gynecology;  Laterality: N/A;   DILITATION & CURRETTAGE/HYSTROSCOPY WITH HYDROTHERMAL ABLATION N/A 06/22/2016   Procedure: DILATATION & CURETTAGE/HYSTEROSCOPY WITH HYDROTHERMAL ABLATION;  Surgeon: Shelly Bombard, MD;  Location: [facility];  Service: Gynecology;  Laterality: N/A;   TOTAL LAPAROSCOPIC HYSTERECTOMY WITH SALPINGECTOMY Bilateral 09/11/2021   Procedure: TOTAL LAPAROSCOPIC HYSTERECTOMY WITH SALPINGECTOMY;  Surgeon: [name], MD;  Location: [facility];  Service: Gynecology;  Laterality: Bilateral;   TUBAL LIGATION     09-28-2015     OB History     Gravida  4   Para      Term      Preterm      AB      Living  4      SAB      IAB      Ectopic      Multiple      Live Births  4           Family History  Problem Relation Age of Onset   Hypertension Maternal Grandmother    Diabetes Maternal Grandfather    Colon cancer Neg  Hx    Esophageal cancer Neg Hx    Rectal cancer Neg Hx    Stomach cancer Neg Hx     Social History   Tobacco Use   Smoking status: Every Day    Packs/day: 0.10    Years: 20.00    Pack years: 2.00    Types: Cigarettes   Smokeless tobacco: Never  Vaping Use   Vaping Use: Never used  Substance Use Topics   Alcohol use: Not Currently   Drug use: Never    Home Medications Prior to Admission medications   Medication Sig Start Date End Date Taking? Authorizing Provider  amLODipine (NORVASC) 10 MG tablet Take 1 tablet (10 mg total) by mouth daily. Patient taking differently: Take 10 mg by mouth at bedtime. 07/31/21 10/29/21  Fenton Foy, NP  cetirizine (ZYRTEC) 10 MG tablet Take 1 tablet (10 mg total) by mouth daily. Patient taking  differently: Take 10 mg by mouth at bedtime. 02/07/21   [name], NP  ibuprofen (ADVIL) 800 MG tablet Take 1 tablet (800 mg total) by mouth every 8 (eight) hours as needed for mild pain. 09/12/21   [name], MD  levocetirizine (XYZAL) 5 MG tablet Take 5 mg by mouth at bedtime. 06/09/21   [provider]  oxyCODONE-acetaminophen (PERCOCET) 5-325 MG tablet Take 1 tablet by mouth every 4 (four) hours as needed. use only as much as needed to relieve pain 09/12/21   Yisroel Ramming, Everardo All, MD  sertraline (ZOLOFT) 100 MG tablet Take 1 tablet (100 mg total) by mouth daily. Patient taking differently: Take 100 mg by mouth at bedtime. 07/31/21 10/29/21  Fenton Foy, NP  sulfamethoxazole-trimethoprim (BACTRIM DS) 800-160 MG tablet Take one tablet by mouth twice a day for 7 days. 09/14/21   [name], MD  dicyclomine (BENTYL) 20 MG tablet Take 1 tablet (20 mg total) by mouth 2 (two) times daily. 10/29/19 08/24/20  Ok Edwards, PA-C  omeprazole (PRILOSEC) 20 MG capsule Take 1 capsule (20 mg total) by mouth daily. 01/01/19 08/07/19  Scot Jun, FNP    Allergies    Tramadol  Review of Systems   Review of Systems  Constitutional:  Negative for chills, diaphoresis, fatigue and fever.  HENT:  Negative for congestion, rhinorrhea and sneezing.   Eyes: Negative.   Respiratory:  Negative for cough, chest tightness and shortness of breath.   Cardiovascular:  Negative for chest pain and leg swelling.  Gastrointestinal:  Positive for abdominal pain and nausea. Negative for blood in stool, diarrhea and vomiting.  Genitourinary:  Negative for difficulty urinating, flank pain, frequency and hematuria.  Musculoskeletal:  Negative for arthralgias and back pain.  Skin:  Negative for rash.  Neurological:  Negative for dizziness, speech difficulty, weakness, numbness and headaches.   Physical Exam Updated Vital Signs BP 121/78 (BP Location: Right Arm)   Pulse 93    Temp 98.5 F (36.9 C) (Oral)   Resp 16   LMP 08/18/2021 (Exact Date)   SpO2 99%   Physical Exam Constitutional:      Appearance: She is well-developed.  HENT:     Head: Normocephalic and atraumatic.  Eyes:     Pupils: Pupils are equal, round, and reactive to light.  Cardiovascular:     Rate and Rhythm: Normal rate and regular rhythm.     Heart sounds: Normal heart sounds.  Pulmonary:     Effort: Pulmonary effort is normal. No respiratory  distress.     Breath sounds: Normal breath sounds. No wheezing or rales.  Chest:     Chest wall: No tenderness.  Abdominal:     General: Bowel sounds are normal.     Palpations: Abdomen is soft.     Tenderness: There is abdominal tenderness (Tenderness across the lower abdomen, there are some healing incisions over the suprapubic area.  No redness or drainage). There is no guarding or rebound.  Musculoskeletal:        General: Normal range of motion.     Cervical back: Normal range of motion and neck supple.  Lymphadenopathy:     Cervical: No cervical adenopathy.  Skin:    General: Skin is warm and dry.     Findings: No rash.  Neurological:     Mental Status: She is alert and oriented to person, place, and time.    ED Results / Procedures / Treatments   Labs (all labs ordered are listed, but only abnormal results are displayed) Labs Reviewed  COMPREHENSIVE METABOLIC PANEL - Abnormal; Notable for the following components:      Result Value   Potassium 5.5 (*)    Glucose, Bld 105 (*)    Calcium 8.6 (*)    All other components within normal limits  CBC WITH DIFFERENTIAL/PLATELET - Abnormal; Notable for the following components:   WBC 13.5 (*)    RBC 5.21 (*)    MCV 74.9 (*)    MCH 23.0 (*)    RDW 16.4 (*)    Platelets 512 (*)    Neutro Abs 9.4 (*)    Monocytes Absolute 1.2 (*)    Eosinophils Absolute 0.8 (*)    Abs Immature Granulocytes 0.12 (*)    All other components within normal limits  URINALYSIS, ROUTINE W REFLEX  MICROSCOPIC - Abnormal; Notable for the following components:   APPearance HAZY (*)    Hgb urine dipstick MODERATE (*)    Leukocytes,Ua LARGE (*)    WBC, UA >50 (*)    Bacteria, UA RARE (*)    All other components within normal limits    EKG None  Radiology CT Abdomen Pelvis W Contrast  Result Date: 09/24/2021 CLINICAL DATA:  Abdominal pain, fever, postop. Hysterectomy 1 week ago. EXAM: CT ABDOMEN AND PELVIS WITH CONTRAST TECHNIQUE: Multidetector CT imaging of the abdomen and pelvis was performed using the standard protocol following bolus administration of intravenous contrast. CONTRAST:  25mL OMNIPAQUE IOHEXOL 350 MG/ML SOLN COMPARISON:  None. FINDINGS: Lower chest: Strandy opacities are noted at the lung bases, possible atelectasis or infiltrate. Hepatobiliary: There is an ill-defined hypodensity in the left lobe of the liver hepatic segment 2 measuring 1.8 cm with indeterminate imaging characteristics. No biliary ductal dilatation. The gallbladder is surgically absent Pancreas: Unremarkable. No pancreatic ductal dilatation or surrounding inflammatory changes. Spleen: Normal in size without focal abnormality. Adrenals/Urinary Tract: No adrenal nodule or mass. Right renal atrophy and cortical scarring are noted. There is a delayed nephrogram on the right. A nonobstructive 7 mm calculus is present in the left kidney. No hydronephrosis is seen bilaterally. An extrarenal pelvis is noted on the right. The bladder is within normal limits. Stomach/Bowel: No bowel obstruction, free air or pneumatosis. A normal appendix is seen in the right lower quadrant the stomach is unremarkable. No focal bowel wall thickening. Vascular/Lymphatic: No significant vascular findings are present. No enlarged abdominal or pelvic lymph nodes. Reproductive: The uterus is surgically absent. There are slightly complex fluid collections in the surgical bed  and left adnexa measuring 2.6 x 1.2 cm and 5.3 by 6.2. Small cystic  structures are noted in the left ovary, likely representing follicles. Other: A fat containing anterior abdominal wall hernia is noted to the right of midline superior to the umbilicus. Musculoskeletal: No acute osseous abnormality. IMPRESSION: 1. Status post hysterectomy with slightly complex free fluid in the surgical bed and left adnexa, possible residual blood products versus developing abscess. 2. Nonobstructive left renal calculus. 3. Right renal atrophy with cortical scarring. 4. Indeterminate hypodensity in the left lobe of the liver. Multiphase CT or MRI suggested for further characterization on follow-up. 5. Strandy opacities at the lung bases, possible atelectasis or infiltrate. Electronically Signed   By: Brett Fairy M.D.   On: 09/24/2021 21:33    Procedures Procedures   Medications Ordered in ED Medications  morphine 4 MG/ML injection 4 mg (4 mg Intravenous Given 09/24/21 1920)  ondansetron (ZOFRAN) injection 4 mg (4 mg Intravenous Given 09/24/21 1919)  sodium chloride 0.9 % bolus 1,000 mL (0 mLs Intravenous Stopped 09/24/21 2017)  iohexol (OMNIPAQUE) 350 MG/ML injection 80 mL (80 mLs Intravenous Contrast Given 09/24/21 2103)    ED Course  I have reviewed the triage vital signs and the nursing notes.  Pertinent labs & imaging results that were available during my care of the patient were reviewed by me and considered in my medical decision making (see chart for details).    MDM Rules/Calculators/A&P                           Patient is a 45 year old female who presents with worsening abdominal pain after recent hysterectomy.  She is afebrile.  Her white count is elevated but it was elevated in the most recent values as well.  She does have significant tenderness on exam.  CT scan was performed which shows a complex fluid collection in her lower pelvis that could be consistent with blood products versus developing abscess.  I spoke with the on-call OB/GYN for Dr. Quincy Simmonds.  She  states she will come and admit the patient for observation.  Her urine has some suggestions of infection but also appears to be a dirty specimen.  We did do a bladder scan and she is not retaining urine. Final Clinical Impression(s) / ED Diagnoses Final diagnoses:  Post-operative pain    Rx / DC Orders ED Discharge Orders     None        Malvin Johns, MD 09/24/21 2213

## 2021-09-25 ENCOUNTER — Encounter (HOSPITAL_COMMUNITY): Payer: Self-pay | Admitting: Obstetrics and Gynecology

## 2021-09-25 DIAGNOSIS — Z5181 Encounter for therapeutic drug level monitoring: Secondary | ICD-10-CM | POA: Diagnosis not present

## 2021-09-25 DIAGNOSIS — I2602 Saddle embolus of pulmonary artery with acute cor pulmonale: Secondary | ICD-10-CM | POA: Diagnosis not present

## 2021-09-25 DIAGNOSIS — K651 Peritoneal abscess: Secondary | ICD-10-CM | POA: Diagnosis not present

## 2021-09-25 DIAGNOSIS — Z90711 Acquired absence of uterus with remaining cervical stump: Secondary | ICD-10-CM | POA: Diagnosis not present

## 2021-09-25 DIAGNOSIS — R7303 Prediabetes: Secondary | ICD-10-CM | POA: Diagnosis present

## 2021-09-25 DIAGNOSIS — I700 Atherosclerosis of aorta: Secondary | ICD-10-CM | POA: Diagnosis not present

## 2021-09-25 DIAGNOSIS — N270 Small kidney, unilateral: Secondary | ICD-10-CM | POA: Diagnosis present

## 2021-09-25 DIAGNOSIS — K219 Gastro-esophageal reflux disease without esophagitis: Secondary | ICD-10-CM | POA: Diagnosis not present

## 2021-09-25 DIAGNOSIS — Z6835 Body mass index (BMI) 35.0-35.9, adult: Secondary | ICD-10-CM | POA: Diagnosis not present

## 2021-09-25 DIAGNOSIS — E669 Obesity, unspecified: Secondary | ICD-10-CM | POA: Diagnosis not present

## 2021-09-25 DIAGNOSIS — G8918 Other acute postprocedural pain: Secondary | ICD-10-CM | POA: Diagnosis present

## 2021-09-25 DIAGNOSIS — E876 Hypokalemia: Secondary | ICD-10-CM | POA: Diagnosis not present

## 2021-09-25 DIAGNOSIS — K567 Ileus, unspecified: Secondary | ICD-10-CM | POA: Diagnosis not present

## 2021-09-25 DIAGNOSIS — I10 Essential (primary) hypertension: Secondary | ICD-10-CM | POA: Diagnosis not present

## 2021-09-25 DIAGNOSIS — Z885 Allergy status to narcotic agent status: Secondary | ICD-10-CM | POA: Diagnosis not present

## 2021-09-25 DIAGNOSIS — Z8616 Personal history of COVID-19: Secondary | ICD-10-CM | POA: Diagnosis not present

## 2021-09-25 DIAGNOSIS — N281 Cyst of kidney, acquired: Secondary | ICD-10-CM | POA: Diagnosis present

## 2021-09-25 DIAGNOSIS — N390 Urinary tract infection, site not specified: Secondary | ICD-10-CM | POA: Diagnosis not present

## 2021-09-25 DIAGNOSIS — N200 Calculus of kidney: Secondary | ICD-10-CM | POA: Diagnosis not present

## 2021-09-25 DIAGNOSIS — F32A Depression, unspecified: Secondary | ICD-10-CM | POA: Diagnosis not present

## 2021-09-25 DIAGNOSIS — Z20822 Contact with and (suspected) exposure to covid-19: Secondary | ICD-10-CM | POA: Diagnosis not present

## 2021-09-25 DIAGNOSIS — I2699 Other pulmonary embolism without acute cor pulmonale: Secondary | ICD-10-CM | POA: Diagnosis not present

## 2021-09-25 DIAGNOSIS — T8143XA Infection following a procedure, organ and space surgical site, initial encounter: Secondary | ICD-10-CM | POA: Diagnosis not present

## 2021-09-25 DIAGNOSIS — Z833 Family history of diabetes mellitus: Secondary | ICD-10-CM | POA: Diagnosis not present

## 2021-09-25 DIAGNOSIS — Z79899 Other long term (current) drug therapy: Secondary | ICD-10-CM | POA: Diagnosis not present

## 2021-09-25 DIAGNOSIS — F17210 Nicotine dependence, cigarettes, uncomplicated: Secondary | ICD-10-CM | POA: Diagnosis not present

## 2021-09-25 DIAGNOSIS — N739 Female pelvic inflammatory disease, unspecified: Secondary | ICD-10-CM | POA: Diagnosis not present

## 2021-09-25 DIAGNOSIS — K769 Liver disease, unspecified: Secondary | ICD-10-CM | POA: Diagnosis present

## 2021-09-25 DIAGNOSIS — J9811 Atelectasis: Secondary | ICD-10-CM | POA: Diagnosis not present

## 2021-09-25 DIAGNOSIS — Z8249 Family history of ischemic heart disease and other diseases of the circulatory system: Secondary | ICD-10-CM | POA: Diagnosis not present

## 2021-09-25 LAB — CBC WITH DIFFERENTIAL/PLATELET
Abs Immature Granulocytes: 0.07 10*3/uL (ref 0.00–0.07)
Basophils Absolute: 0 10*3/uL (ref 0.0–0.1)
Basophils Relative: 0 %
Eosinophils Absolute: 0.6 10*3/uL — ABNORMAL HIGH (ref 0.0–0.5)
Eosinophils Relative: 6 %
HCT: 35.3 % — ABNORMAL LOW (ref 36.0–46.0)
Hemoglobin: 10.9 g/dL — ABNORMAL LOW (ref 12.0–15.0)
Immature Granulocytes: 1 %
Lymphocytes Relative: 17 %
Lymphs Abs: 1.7 10*3/uL (ref 0.7–4.0)
MCH: 23 pg — ABNORMAL LOW (ref 26.0–34.0)
MCHC: 30.9 g/dL (ref 30.0–36.0)
MCV: 74.5 fL — ABNORMAL LOW (ref 80.0–100.0)
Monocytes Absolute: 0.7 10*3/uL (ref 0.1–1.0)
Monocytes Relative: 7 %
Neutro Abs: 7.2 10*3/uL (ref 1.7–7.7)
Neutrophils Relative %: 69 %
Platelets: 397 10*3/uL (ref 150–400)
RBC: 4.74 MIL/uL (ref 3.87–5.11)
RDW: 16 % — ABNORMAL HIGH (ref 11.5–15.5)
WBC: 10.4 10*3/uL (ref 4.0–10.5)
nRBC: 0 % (ref 0.0–0.2)

## 2021-09-25 LAB — RESP PANEL BY RT-PCR (FLU A&B, COVID) ARPGX2
Influenza A by PCR: NEGATIVE
Influenza B by PCR: NEGATIVE
SARS Coronavirus 2 by RT PCR: NEGATIVE

## 2021-09-25 MED ORDER — PIPERACILLIN-TAZOBACTAM 3.375 G IVPB
3.3750 g | Freq: Four times a day (QID) | INTRAVENOUS | Status: DC
Start: 2021-09-25 — End: 2021-09-25

## 2021-09-25 MED ORDER — LIP MEDEX EX OINT
TOPICAL_OINTMENT | CUTANEOUS | Status: DC | PRN
  Filled 2021-09-25: qty 7

## 2021-09-25 MED ORDER — LACTATED RINGERS IV SOLN
INTRAVENOUS | Status: DC
Start: 2021-09-25 — End: 2021-09-26

## 2021-09-25 MED ORDER — PIPERACILLIN-TAZOBACTAM 3.375 G IVPB
3.3750 g | Freq: Three times a day (TID) | INTRAVENOUS | Status: DC
  Administered 2021-09-25 – 2021-09-26 (×4): 3.375 g via INTRAVENOUS
  Filled 2021-09-25 (×6): qty 50

## 2021-09-25 MED ORDER — HYDROMORPHONE HCL 1 MG/ML IJ SOLN
0.5000 mg | INTRAMUSCULAR | Status: DC | PRN
  Administered 2021-09-25 – 2021-09-29 (×10): 0.5 mg via INTRAVENOUS
  Filled 2021-09-25 (×10): qty 0.5

## 2021-09-25 MED ORDER — MORPHINE SULFATE (PF) 2 MG/ML IV SOLN: 1.0000 mg | INTRAVENOUS | Status: DC | PRN

## 2021-09-25 MED ORDER — PIPERACILLIN-TAZOBACTAM 3.375 G IVPB
3.3750 g | Freq: Three times a day (TID) | INTRAVENOUS | Status: DC
  Filled 2021-09-25: qty 50

## 2021-09-25 NOTE — Progress Notes (Signed)
Subjective: Patient reports nausea all day today.  Denies emesis after this morning.  Denies vaginal bleeding.  Still having pelvic pain.  Doesn't want any pain medication as she thinks this will make her nausea worse.  Has had diarrhea x 1.    Objective: I have reviewed patient's vital signs, intake and output, medications, and labs. Vitals:   09/25/21 0950 09/25/21 1356  BP: 133/89 (!) 133/91  Pulse: 88 88  Resp: 19 20  Temp: 97.8 F (36.6 C) 98.1 F (36.7 C)  SpO2: 97% 96%    General: alert, cooperative, and no distress Resp: clear to auscultation bilaterally Cardio: regular rate and rhythm, S1, S2 normal, no murmur, click, rub or gallop GI: soft, mild distension, suprapubic tenderness to palpation, normal BS Extremities: extremities normal, atraumatic, no cyanosis or edema Vaginal Bleeding: none   Assessment/Plan: 14 days post op TLH/bilateral salpingectomy/midurethral sling with possible pelvic abscess.  Now with nausea.  Continue Zosyn Repeat CBC/CMP in AM Adding fluids now Consider repeat CT scan if does not have improvement in pain after 48 hours antibiotics  LOS: 0 days    Megan Salon 09/25/2021, 5:53 PM

## 2021-09-25 NOTE — ED Notes (Signed)
ED TO INPATIENT HANDOFF REPORT  Name/Age/Gender Brianna Gutierrez 45 y.o. female  Code Status    Code Status Orders  (From admission, onward)           Start     Ordered   09/24/21 2251  Full code  Continuous        09/24/21 2253           Code Status History     Date Active Date Inactive Code Status Order ID Comments User Context   09/11/2021 0532 09/12/2021 1505 Full Code [id number]  [name], MD Inpatient   01/13/2019 1150 01/15/2019 1851 Full Code [id number]  Saverio Danker, PA-C Inpatient       Home/SNF/Other Home  Chief Complaint Pelvic infection in female [N73.9]  Level of Care/Admitting Diagnosis ED Disposition     ED Disposition  Admit   Condition  --   [place]: [facility] [[id number]]  Level of Care: Med-Surg [16]  May place patient in observation at [facility] or [place] if equivalent level of care is available:: Yes  Covid Evaluation: Asymptomatic Screening Protocol (No Symptoms)  Diagnosis: Pelvic infection in female [[id number]]  Admitting Physician: Jaquita Folds [[id number]]  Attending Physician: Jaquita Folds [[id number]]          Medical History Past Medical History:  Diagnosis Date   Abnormal Pap smear of cervix    Anxiety    Depression    Dysmenorrhea    Eczema    GERD (gastroesophageal reflux disease)    History of COVID-19 10/24/2020   loss of taste and smellbody aches and cough x 4 days all symptoms resolved   Hypertension    Pre-diabetes    Rash 08/31/2021   comes and goes on legs saw dermetology no rash now ? food allergy   SUI (stress urinary incontinence, female)    Trichomonas infection 02/2021   Uterine fibroid     Allergies Allergies  Allergen Reactions   Tramadol Other (See Comments)    Headache    IV Location/Drains/Wounds Patient Lines/Drains/Airways Status     Active Line/Drains/Airways     Name Placement date Placement time Site Days    Peripheral IV 09/24/21 22 G Posterior;Right Wrist 09/24/21  1919  Wrist  1   Incision (Closed) 06/22/16 Vagina 06/22/16  1156  -- 1921   Incision (Closed) 01/14/19 Abdomen Other (Comment) 01/14/19  1353  -- 985   Incision (Closed) 09/11/21 Vagina Other (Comment) 09/11/21  0900  -- 14   Incision (Closed) 09/11/21 Abdomen Other (Comment) 09/11/21  0900  -- 14   Incision - 4 Ports Abdomen Right;Lateral Right;Medial Umbilicus Left;Upper 00/93/81  1328  -- 985   Incision - 4 Ports Abdomen Umbilicus Right;Medial Left;Medial Left;Lateral 09/11/21  0810  -- 14   Incision - 2 Ports Other (Comment) Bilateral Lower;Other (Comment) 09/11/21  1145  -- 14            Labs/Imaging Results for orders placed or performed during the hospital encounter of 09/24/21 (from the past 48 hour(s))  Comprehensive metabolic panel     Status: Abnormal   Collection Time: 09/24/21  7:25 PM  Result Value Ref Range   Sodium 135 135 - 145 mmol/L   Potassium 5.5 (H) 3.5 - 5.1 mmol/L    Comment: MODERATE HEMOLYSIS   Chloride 105 98 - 111 mmol/L   CO2 23 22 - 32 mmol/L   Glucose, Bld 105 (H) 70 - 99  mg/dL    Comment: Glucose reference range applies only to samples taken after fasting for at least 8 hours.   BUN 16 6 - 20 mg/dL   Creatinine, Ser 0.76 0.44 - 1.00 mg/dL   Calcium 8.6 (L) 8.9 - 10.3 mg/dL   Total Protein 7.7 6.5 - 8.1 g/dL   Albumin 3.6 3.5 - 5.0 g/dL   AST 39 15 - 41 U/L   ALT 23 0 - 44 U/L   Alkaline Phosphatase 114 38 - 126 U/L   Total Bilirubin 1.2 0.3 - 1.2 mg/dL   GFR, Estimated >60 >60 mL/min    Comment: (NOTE) Calculated using the CKD-EPI Creatinine Equation (2021)    Anion gap 7 5 - 15    Comment: Performed at [facility], [place] [address], [place], [place] [address]  CBC with Differential     Status: Abnormal   Collection Time: 09/24/21  7:25 PM  Result Value Ref Range   WBC 13.5 (H) 4.0 - 10.5 K/uL   RBC 5.21 (H) 3.87 - 5.11 MIL/uL   Hemoglobin 12.0 12.0 - 15.0 g/dL    HCT 39.0 36.0 - 46.0 %   MCV 74.9 (L) 80.0 - 100.0 fL   MCH 23.0 (L) 26.0 - 34.0 pg   MCHC 30.8 30.0 - 36.0 g/dL   RDW 16.4 (H) 11.5 - 15.5 %   Platelets 512 (H) 150 - 400 K/uL   nRBC 0.0 0.0 - 0.2 %   Neutrophils Relative % 69 %   Neutro Abs 9.4 (H) 1.7 - 7.7 K/uL   Lymphocytes Relative 15 %   Lymphs Abs 2.1 0.7 - 4.0 K/uL   Monocytes Relative 9 %   Monocytes Absolute 1.2 (H) 0.1 - 1.0 K/uL   Eosinophils Relative 6 %   Eosinophils Absolute 0.8 (H) 0.0 - 0.5 K/uL   Basophils Relative 0 %   Basophils Absolute 0.0 0.0 - 0.1 K/uL   Immature Granulocytes 1 %   Abs Immature Granulocytes 0.12 (H) 0.00 - 0.07 K/uL    Comment: Performed at [facility], [place] [address] [name], [place] [address]  Urinalysis, Routine w reflex microscopic Urine, Clean Catch     Status: Abnormal   Collection Time: 09/24/21  8:30 PM  Result Value Ref Range   Color, Urine YELLOW YELLOW   APPearance HAZY (A) CLEAR   Specific Gravity, Urine 1.013 1.005 - 1.030   pH 7.0 5.0 - 8.0   Glucose, UA NEGATIVE NEGATIVE mg/dL   Hgb urine dipstick MODERATE (A) NEGATIVE   Bilirubin Urine NEGATIVE NEGATIVE   Ketones, ur NEGATIVE NEGATIVE mg/dL   Protein, ur NEGATIVE NEGATIVE mg/dL   Nitrite NEGATIVE NEGATIVE   Leukocytes,Ua LARGE (A) NEGATIVE   RBC / HPF 21-50 0 - 5 RBC/hpf   WBC, UA >50 (H) 0 - 5 WBC/hpf   Bacteria, UA RARE (A) NONE SEEN   Squamous Epithelial / LPF 6-10 0 - 5   Mucus PRESENT     Comment: Performed at [facility], [place] [address] [place], [place] [address]   CT Abdomen Pelvis W Contrast  Result Date: 09/24/2021 CLINICAL DATA:  Abdominal pain, fever, postop. Hysterectomy 1 week ago. EXAM: CT ABDOMEN AND PELVIS WITH CONTRAST TECHNIQUE: Multidetector CT imaging of the abdomen and pelvis was performed using the standard protocol following bolus administration of intravenous contrast. CONTRAST:  72mL OMNIPAQUE IOHEXOL 350 MG/ML SOLN COMPARISON:  None. FINDINGS:  Lower chest: Strandy opacities are noted at the lung bases,  possible atelectasis or infiltrate. Hepatobiliary: There is an ill-defined hypodensity in the left lobe of the liver hepatic segment 2 measuring 1.8 cm with indeterminate imaging characteristics. No biliary ductal dilatation. The gallbladder is surgically absent Pancreas: Unremarkable. No pancreatic ductal dilatation or surrounding inflammatory changes. Spleen: Normal in size without focal abnormality. Adrenals/Urinary Tract: No adrenal nodule or mass. Right renal atrophy and cortical scarring are noted. There is a delayed nephrogram on the right. A nonobstructive 7 mm calculus is present in the left kidney. No hydronephrosis is seen bilaterally. An extrarenal pelvis is noted on the right. The bladder is within normal limits. Stomach/Bowel: No bowel obstruction, free air or pneumatosis. A normal appendix is seen in the right lower quadrant the stomach is unremarkable. No focal bowel wall thickening. Vascular/Lymphatic: No significant vascular findings are present. No enlarged abdominal or pelvic lymph nodes. Reproductive: The uterus is surgically absent. There are slightly complex fluid collections in the surgical bed and left adnexa measuring 2.6 x 1.2 cm and 5.3 by 6.2. Small cystic structures are noted in the left ovary, likely representing follicles. Other: A fat containing anterior abdominal wall hernia is noted to the right of midline superior to the umbilicus. Musculoskeletal: No acute osseous abnormality. IMPRESSION: 1. Status post hysterectomy with slightly complex free fluid in the surgical bed and left adnexa, possible residual blood products versus developing abscess. 2. Nonobstructive left renal calculus. 3. Right renal atrophy with cortical scarring. 4. Indeterminate hypodensity in the left lobe of the liver. Multiphase CT or MRI suggested for further characterization on follow-up. 5. Strandy opacities at the lung bases, possible atelectasis  or infiltrate. Electronically Signed   By: Brett Fairy M.D.   On: 09/24/2021 21:33    Pending Labs Unresulted Labs (From admission, onward)     Start     Ordered   09/25/21 0500  CBC WITH DIFFERENTIAL  Daily,   R      09/24/21 2253   09/24/21 2245  Resp Panel by RT-PCR (Flu A&B, Covid) Nasopharyngeal Swab  (Tier 2 - Symptomatic/asymptomatic)  Once,   STAT        09/24/21 2244            Vitals/Pain Today's Vitals   09/24/21 1835 09/24/21 2019 09/24/21 2148  BP: (!) 174/100 (!) 141/85 121/78  Pulse: (!) 107 91 93  Resp: 16 16 16   Temp: 98.5 F (36.9 C)    TempSrc: Oral    SpO2: 100% 100% 99%    Isolation Precautions No active isolations  Medications Medications  amLODipine (NORVASC) tablet 10 mg (10 mg Oral Given 09/24/21 2305)  sertraline (ZOLOFT) tablet 100 mg (100 mg Oral Given 09/24/21 2304)  prenatal multivitamin tablet 1 tablet (has no administration in time range)  ibuprofen (ADVIL) tablet 800 mg (has no administration in time range)  oxyCODONE-acetaminophen (PERCOCET/ROXICET) 5-325 MG per tablet 1-2 tablet (has no administration in time range)  morphine 2 MG/ML injection 1-2 mg (2 mg Intravenous Given 09/24/21 2304)  docusate sodium (COLACE) capsule 100 mg (100 mg Oral Given 09/24/21 2304)  alum & mag hydroxide-simeth (MAALOX/MYLANTA) 200-200-20 MG/5ML suspension 30 mL (has no administration in time range)  simethicone (MYLICON) chewable tablet 80 mg (has no administration in time range)  ondansetron (ZOFRAN) tablet 4 mg (has no administration in time range)    Or  ondansetron (ZOFRAN) injection 4 mg (has no administration in time range)  loratadine (CLARITIN) tablet 10 mg (10 mg Oral Given 09/24/21 2304)  piperacillin-tazobactam (ZOSYN) IVPB 3.375 g (  3.375 g Intravenous New Bag/Given 09/24/21 2305)  morphine 4 MG/ML injection 4 mg (4 mg Intravenous Given 09/24/21 1920)  ondansetron (ZOFRAN) injection 4 mg (4 mg Intravenous Given 09/24/21 1919)  sodium  chloride 0.9 % bolus 1,000 mL (0 mLs Intravenous Stopped 09/24/21 2017)  iohexol (OMNIPAQUE) 350 MG/ML injection 80 mL (80 mLs Intravenous Contrast Given 09/24/21 2103)    Mobility walks

## 2021-09-25 NOTE — Progress Notes (Signed)
Subjective: Patient reports low pelvic pain is still present.  She also had some emesis this morning but that seems to have resolved.  Has eaten saltines and ginger ale.  Feels taking medication including pain medication on empty stomach was part of the problem.  Denies feeling feverish.  D/w pt lowered WBC ct today.  No fevers since in hospital.  CT scan results reviewed with pt.  Objective: I have reviewed patient's vital signs, intake and output, medications, and labs. Vitals:   09/25/21 0548 09/25/21 0950  BP: 127/83 133/89  Pulse: 72 88  Resp: 20 19  Temp: (!) 97.5 F (36.4 C) 97.8 F (36.6 C)  SpO2: 95% 97%    General: alert, cooperative, and no distress Resp: clear to auscultation bilaterally Cardio: regular rate and rhythm, S1, S2 normal, no murmur, click, rub or gallop GI: soft, non-tender; bowel sounds normal; no masses,  no organomegaly and incision: clean, dry, and intact Extremities: extremities normal, atraumatic, no cyanosis or edema Vaginal Bleeding: none   Assessment/Plan: S/p TLH/bilateral salpingectomy/cystoscopy now with pelvic/adnexal fluid collection possible developing abscess, elevated WBC ct and post op pain  Continue zosyn 3.375mg  q 6 hours Add SCDs Add dilaudid and d/c Iv morphine Repeat CBC in AM   LOS: 0 days    Megan Salon 09/25/2021, 12:47 PM

## 2021-09-26 LAB — CBC WITH DIFFERENTIAL/PLATELET
Abs Immature Granulocytes: 0.05 10*3/uL (ref 0.00–0.07)
Basophils Absolute: 0 10*3/uL (ref 0.0–0.1)
Basophils Relative: 0 %
Eosinophils Absolute: 0.7 10*3/uL — ABNORMAL HIGH (ref 0.0–0.5)
Eosinophils Relative: 7 %
HCT: 35.4 % — ABNORMAL LOW (ref 36.0–46.0)
Hemoglobin: 11 g/dL — ABNORMAL LOW (ref 12.0–15.0)
Immature Granulocytes: 1 %
Lymphocytes Relative: 16 %
Lymphs Abs: 1.6 10*3/uL (ref 0.7–4.0)
MCH: 23.2 pg — ABNORMAL LOW (ref 26.0–34.0)
MCHC: 31.1 g/dL (ref 30.0–36.0)
MCV: 74.7 fL — ABNORMAL LOW (ref 80.0–100.0)
Monocytes Absolute: 0.8 10*3/uL (ref 0.1–1.0)
Monocytes Relative: 8 %
Neutro Abs: 6.8 10*3/uL (ref 1.7–7.7)
Neutrophils Relative %: 68 %
Platelets: 410 10*3/uL — ABNORMAL HIGH (ref 150–400)
RBC: 4.74 MIL/uL (ref 3.87–5.11)
RDW: 16.2 % — ABNORMAL HIGH (ref 11.5–15.5)
WBC: 9.9 10*3/uL (ref 4.0–10.5)
nRBC: 0 % (ref 0.0–0.2)

## 2021-09-26 LAB — BASIC METABOLIC PANEL
Anion gap: 7 (ref 5–15)
BUN: 9 mg/dL (ref 6–20)
CO2: 25 mmol/L (ref 22–32)
Calcium: 8.6 mg/dL — ABNORMAL LOW (ref 8.9–10.3)
Chloride: 103 mmol/L (ref 98–111)
Creatinine, Ser: 0.65 mg/dL (ref 0.44–1.00)
GFR, Estimated: >60 mL/min (ref >60)
Glucose, Bld: 109 mg/dL — ABNORMAL HIGH (ref 70–99)
Potassium: 3.6 mmol/L (ref 3.5–5.1)
Sodium: 135 mmol/L (ref 135–145)

## 2021-09-26 MED ORDER — AMOXICILLIN-POT CLAVULANATE 875-125 MG PO TABS
ORAL_TABLET | ORAL | Status: AC
  Administered 2021-09-26: 1 via ORAL
  Filled 2021-09-26: qty 1

## 2021-09-26 MED ORDER — METRONIDAZOLE 500 MG PO TABS
500.0000 mg | ORAL_TABLET | Freq: Three times a day (TID) | ORAL | Status: DC
  Administered 2021-09-27 (×2): 500 mg via ORAL
  Filled 2021-09-26 (×2): qty 1

## 2021-09-26 MED ORDER — AMOXICILLIN-POT CLAVULANATE 875-125 MG PO TABS
1.0000 | ORAL_TABLET | Freq: Two times a day (BID) | ORAL | Status: DC
  Administered 2021-09-27: 1 via ORAL
  Filled 2021-09-26: qty 1

## 2021-09-26 MED ORDER — ADULT MULTIVITAMIN W/MINERALS CH
ORAL_TABLET | ORAL | Status: AC
  Filled 2021-09-26: qty 1

## 2021-09-26 MED ORDER — METRONIDAZOLE 500 MG PO TABS
ORAL_TABLET | ORAL | Status: AC
  Administered 2021-09-26: 500 mg via ORAL
  Filled 2021-09-26: qty 1

## 2021-09-26 NOTE — Progress Notes (Signed)
HD#2 PO Pelvic abscess  POD#15 TLH/BS/TVT Exact Sling/Cystoscopy  Subjective: Patient reports tolerating PO, + BM, and no problems voiding.   Feels bloated with gas pain.  Passing loose stools, but passing just a little bit of gas at a time.  On Dilaudid IV regularly.  No N/V.  No vaginal bleeding or discharge.  No chills.  Objective: I have reviewed patient's vital signs, intake and output, medications, labs, and radiology results.  Vitals:   09/26/21 0413 09/26/21 1404  BP: 121/71 129/85  Pulse: 85 87  Resp: 16 18  Temp: 97.8 F (36.6 C) 98.7 F (37.1 C)  SpO2: 92% 93%    11/28 0701 - 11/29 0700 In: 592.8 [I.V.:546; IV Piggyback:46.8] Out: -   Total I/O In: 93.3 [I.V.:77.4; IV Piggyback:15.9] Out: -    Lab Results  Component Value Date   WBC 9.9 09/26/2021   HGB 11.0 (L) 09/26/2021   HCT 35.4 (L) 09/26/2021   MCV 74.7 (L) 09/26/2021   PLT 410 (H) 09/26/2021    CT Abdomen Pelvis W Contrast  Result Date: 09/24/2021 CLINICAL DATA:  Abdominal pain, fever, postop. Hysterectomy 1 week ago. EXAM: CT ABDOMEN AND PELVIS WITH CONTRAST TECHNIQUE: Multidetector CT imaging of the abdomen and pelvis was performed using the standard protocol following bolus administration of intravenous contrast. CONTRAST:  67mL OMNIPAQUE IOHEXOL 350 MG/ML SOLN COMPARISON:  None. FINDINGS: Lower chest: Strandy opacities are noted at the lung bases, possible atelectasis or infiltrate. Hepatobiliary: There is an ill-defined hypodensity in the left lobe of the liver hepatic segment 2 measuring 1.8 cm with indeterminate imaging characteristics. No biliary ductal dilatation. The gallbladder is surgically absent Pancreas: Unremarkable. No pancreatic ductal dilatation or surrounding inflammatory changes. Spleen: Normal in size without focal abnormality. Adrenals/Urinary Tract: No adrenal nodule or mass. Right renal atrophy and cortical scarring are noted. There is a delayed nephrogram on the right. A  nonobstructive 7 mm calculus is present in the left kidney. No hydronephrosis is seen bilaterally. An extrarenal pelvis is noted on the right. The bladder is within normal limits. Stomach/Bowel: No bowel obstruction, free air or pneumatosis. A normal appendix is seen in the right lower quadrant the stomach is unremarkable. No focal bowel wall thickening. Vascular/Lymphatic: No significant vascular findings are present. No enlarged abdominal or pelvic lymph nodes. Reproductive: The uterus is surgically absent. There are slightly complex fluid collections in the surgical bed and left adnexa measuring 2.6 x 1.2 cm and 5.3 by 6.2. Small cystic structures are noted in the left ovary, likely representing follicles. Other: A fat containing anterior abdominal wall hernia is noted to the right of midline superior to the umbilicus. Musculoskeletal: No acute osseous abnormality. IMPRESSION: 1. Status post hysterectomy with slightly complex free fluid in the surgical bed and left adnexa, possible residual blood products versus developing abscess. 2. Nonobstructive left renal calculus. 3. Right renal atrophy with cortical scarring. 4. Indeterminate hypodensity in the left lobe of the liver. Multiphase CT or MRI suggested for further characterization on follow-up. 5. Strandy opacities at the lung bases, possible atelectasis or infiltrate. Electronically Signed   By: Brett Fairy M.D.   On: 09/24/2021 21:33    General: alert, cooperative, and no distress  Abdomen:  Mildly/moderately distended, but soft and depressible.  BS present.  Occasional High pitch sounds.  Incisions intact.  No vaginal bleeding   Assessment/Plan: POD#15 TLH/BS/LOA/TVT Sling with PO Pelvic Abscesses.  Responding to Zosyn IV given x 48 hours.  WBC count back to normal, afebrile.  Mild/Moderate Ileus.   Will switch to PO ABTx with Augmentin and Flagyl.  Repeat CBC with diff in am.  Repeat CT scan tomorrow.   Increase ambulation/Sitting.  Stop IV  pain medication.  NSAIDS/Percocet.   Princess Bruins, MD 6:42 PM 09/26/2021

## 2021-09-26 NOTE — Progress Notes (Signed)
Subjective: Patient reports pain is improved.  Did receive pain medication over night.  No vaginal bleeding or discharge.  Has had some continued nausea and did have diarrhea x 2.  Did eat regular dinner last night.  Advised WBC ct continues to improved.  She has not had a fever in the hospital.  Objective: I have reviewed patient's vital signs, intake and output, medications, and labs. Vitals:   09/25/21 1953 09/26/21 0413  BP: 128/79 121/71  Pulse: 91 85  Resp: 16 16  Temp: 98.1 F (36.7 C) 97.8 F (36.6 C)  SpO2: 94% 92%    General: alert, cooperative, and no distress Resp: clear to auscultation bilaterally Cardio: regular rate and rhythm, S1, S2 normal, no murmur, click, rub or gallop GI: soft, improved lower quadrant tenderness, normal BS, mild distension Extremities: extremities normal, atraumatic, no cyanosis or edema Vaginal Bleeding: none   Assessment/Plan: Post op day 15 from TLH/bilateral salpingectomy/cystoscopy, midurethral sling with post op pain and elevated WBC ct (both improving) and Imaging with possible abscess.  Continue zosyn for 48 hours.  Hopeful d/c after that time with continued outpatient abx for 14 days.   LOS: 1 day    Megan Salon 09/26/2021, 6:50 AM

## 2021-09-27 ENCOUNTER — Inpatient Hospital Stay (HOSPITAL_COMMUNITY): Payer: BC Managed Care – PPO

## 2021-09-27 DIAGNOSIS — N739 Female pelvic inflammatory disease, unspecified: Secondary | ICD-10-CM

## 2021-09-27 LAB — BASIC METABOLIC PANEL
Anion gap: 8 (ref 5–15)
BUN: 10 mg/dL (ref 6–20)
CO2: 27 mmol/L (ref 22–32)
Calcium: 8.5 mg/dL — ABNORMAL LOW (ref 8.9–10.3)
Chloride: 101 mmol/L (ref 98–111)
Creatinine, Ser: 0.67 mg/dL (ref 0.44–1.00)
GFR, Estimated: >60 mL/min (ref >60)
Glucose, Bld: 115 mg/dL — ABNORMAL HIGH (ref 70–99)
Potassium: 3.6 mmol/L (ref 3.5–5.1)
Sodium: 136 mmol/L (ref 135–145)

## 2021-09-27 LAB — CBC WITH DIFFERENTIAL/PLATELET
Abs Immature Granulocytes: 0.06 10*3/uL (ref 0.00–0.07)
Basophils Absolute: 0 10*3/uL (ref 0.0–0.1)
Basophils Relative: 0 %
Eosinophils Absolute: 0.7 10*3/uL — ABNORMAL HIGH (ref 0.0–0.5)
Eosinophils Relative: 8 %
HCT: 36.1 % (ref 36.0–46.0)
Hemoglobin: 10.8 g/dL — ABNORMAL LOW (ref 12.0–15.0)
Immature Granulocytes: 1 %
Lymphocytes Relative: 15 %
Lymphs Abs: 1.4 10*3/uL (ref 0.7–4.0)
MCH: 22.8 pg — ABNORMAL LOW (ref 26.0–34.0)
MCHC: 29.9 g/dL — ABNORMAL LOW (ref 30.0–36.0)
MCV: 76.3 fL — ABNORMAL LOW (ref 80.0–100.0)
Monocytes Absolute: 0.7 10*3/uL (ref 0.1–1.0)
Monocytes Relative: 8 %
Neutro Abs: 6.2 10*3/uL (ref 1.7–7.7)
Neutrophils Relative %: 68 %
Platelets: 384 10*3/uL (ref 150–400)
RBC: 4.73 MIL/uL (ref 3.87–5.11)
RDW: 15.9 % — ABNORMAL HIGH (ref 11.5–15.5)
WBC: 9.1 10*3/uL (ref 4.0–10.5)
nRBC: 0 % (ref 0.0–0.2)

## 2021-09-27 LAB — PROTIME-INR
INR: 1 (ref 0.8–1.2)
Prothrombin Time: 13.3 seconds (ref 11.4–15.2)

## 2021-09-27 LAB — APTT: aPTT: 45 seconds — ABNORMAL HIGH (ref 24–36)

## 2021-09-27 LAB — TROPONIN I (HIGH SENSITIVITY): Troponin I (High Sensitivity): 2 ng/L (ref <18)

## 2021-09-27 LAB — BRAIN NATRIURETIC PEPTIDE: B Natriuretic Peptide: 248.2 pg/mL — ABNORMAL HIGH (ref 0.0–100.0)

## 2021-09-27 MED ORDER — BISACODYL 10 MG RE SUPP
10.0000 mg | Freq: Once | RECTAL | Status: AC
  Administered 2021-09-27: 10 mg via RECTAL
  Filled 2021-09-27: qty 1

## 2021-09-27 MED ORDER — LACTATED RINGERS IV SOLN: INTRAVENOUS | Status: DC

## 2021-09-27 MED ORDER — HEPARIN BOLUS VIA INFUSION
2100.0000 [IU] | Freq: Once | INTRAVENOUS | Status: AC
  Administered 2021-09-27: 2100 [IU] via INTRAVENOUS
  Filled 2021-09-27: qty 2100

## 2021-09-27 MED ORDER — SODIUM CHLORIDE (PF) 0.9 % IJ SOLN
INTRAMUSCULAR | Status: AC
  Administered 2021-09-27: 10 mL
  Filled 2021-09-27: qty 50

## 2021-09-27 MED ORDER — SIMETHICONE 40 MG/0.6ML PO SUSP
80.0000 mg | Freq: Four times a day (QID) | ORAL | Status: DC
  Administered 2021-09-27 – 2021-09-29 (×7): 80 mg via ORAL
  Filled 2021-09-27 (×14): qty 1.2

## 2021-09-27 MED ORDER — IOHEXOL 350 MG/ML SOLN
80.0000 mL | Freq: Once | INTRAVENOUS | Status: AC | PRN
  Administered 2021-09-27: 80 mL via INTRAVENOUS

## 2021-09-27 MED ORDER — ACETAMINOPHEN 10 MG/ML IV SOLN
1000.0000 mg | Freq: Four times a day (QID) | INTRAVENOUS | Status: DC
  Filled 2021-09-27 (×3): qty 100

## 2021-09-27 MED ORDER — DIATRIZOATE MEGLUMINE & SODIUM 66-10 % PO SOLN
ORAL | Status: AC
  Administered 2021-09-27: 15 mL via ORAL
  Filled 2021-09-27: qty 30

## 2021-09-27 MED ORDER — KCL IN DEXTROSE-NACL 20-5-0.45 MEQ/L-%-% IV SOLN
INTRAVENOUS | Status: DC
  Filled 2021-09-27: qty 1000

## 2021-09-27 MED ORDER — BISACODYL 10 MG RE SUPP: 10.0000 mg | Freq: Every day | RECTAL | Status: DC | PRN

## 2021-09-27 MED ORDER — HEPARIN (PORCINE) 25000 UT/250ML-% IV SOLN
1650.0000 [IU]/h | INTRAVENOUS | Status: DC
  Administered 2021-09-27: 1300 [IU]/h via INTRAVENOUS
  Administered 2021-09-28: 1650 [IU]/h via INTRAVENOUS
  Administered 2021-09-28: 1500 [IU]/h via INTRAVENOUS
  Filled 2021-09-27 (×3): qty 250

## 2021-09-27 MED ORDER — ACETAMINOPHEN 500 MG PO TABS
1000.0000 mg | ORAL_TABLET | Freq: Four times a day (QID) | ORAL | Status: AC
  Administered 2021-09-27 – 2021-09-28 (×4): 1000 mg via ORAL
  Filled 2021-09-27 (×4): qty 2

## 2021-09-27 MED ORDER — OXYCODONE HCL 5 MG PO TABS
5.0000 mg | ORAL_TABLET | Freq: Four times a day (QID) | ORAL | Status: DC | PRN
  Administered 2021-09-29: 5 mg via ORAL
  Filled 2021-09-27: qty 1

## 2021-09-27 MED ORDER — PIPERACILLIN-TAZOBACTAM 3.375 G IVPB
3.3750 g | Freq: Three times a day (TID) | INTRAVENOUS | Status: DC
  Administered 2021-09-27 – 2021-09-29 (×6): 3.375 g via INTRAVENOUS
  Filled 2021-09-27 (×8): qty 50

## 2021-09-27 MED ORDER — DIATRIZOATE MEGLUMINE & SODIUM 66-10 % PO SOLN
15.0000 mL | ORAL | Status: AC
  Filled 2021-09-27: qty 30

## 2021-09-27 NOTE — Progress Notes (Signed)
ANTICOAGULATION CONSULT NOTE - Initial Consult  Pharmacy Consult for Heparin Indication: pulmonary embolus  Allergies  Allergen Reactions   Tramadol Other (See Comments)    Headache    Patient Measurements:   Last weight 90.3 kg (11/17) Heparin Dosing Weight: 70 kg  Vital Signs: Temp: 98.4 F (36.9 C) (11/30 1617) Temp Source: Oral (11/30 1617) BP: 139/82 (11/30 1617) Pulse Rate: 79 (11/30 1617)  Labs: Recent Labs    09/24/21 1925 09/25/21 0518 09/26/21 0502 09/27/21 0521  HGB 12.0 10.9* 11.0* 10.8*  HCT 39.0 35.3* 35.4* 36.1  PLT 512* 397 410* 384  CREATININE 0.76  --  0.65 0.67    Estimated Creatinine Clearance: 92.8 mL/min (by C-G formula based on SCr of 0.67 mg/dL).   Medical History: Past Medical History:  Diagnosis Date   Abnormal Pap smear of cervix    Anxiety    Depression    Dysmenorrhea    Eczema    GERD (gastroesophageal reflux disease)    History of COVID-19 10/24/2020   loss of taste and smellbody aches and cough x 4 days all symptoms resolved   Hypertension    Pre-diabetes    Rash 08/31/2021   comes and goes on legs saw dermetology no rash now ? food allergy   SUI (stress urinary incontinence, female)    Trichomonas infection 02/2021   Uterine fibroid     Medications:  Infusions:   Assessment: 3 yoF admitted on 11/27 for worsening abdominal pain s/p hysterectomy on 11/14.  Found to have PE on 11/30 and pharmacy is consulted to dose Heparin. No current or prior anticoagulation.  SCDs have been on since 11/28 Baseline coags pending CBC:  stable/low Hgb 10.8, Plt WNL  Goal of Therapy:  Heparin level 0.3-0.7 units/ml Monitor platelets by anticoagulation protocol: Yes   Plan:  Baseline PTT, PT/INR Give heparin 2100 units bolus IV x 1 Start heparin IV infusion at 1300 units/hr Heparin level 6 hours after starting Daily heparin level and CBC   Gretta Arab PharmD, BCPS Clinical Pharmacist [facility] main pharmacy  [phone] 09/27/2021 4:48 PM

## 2021-09-27 NOTE — Progress Notes (Signed)
x1 episode of emesis. PRN Zofran administered.

## 2021-09-27 NOTE — Progress Notes (Signed)
Patient has multiple acute medical conditions that warrant close monitoring and management by internal medicine/hospitalist service.  Discussed with Gynecology attending, Dr. Talbert Nan, [facility], hospitalist team, will take over as primary team.  Transfer to [facility] on 09/27/2021.  Gynecology will continue to see in consultation.  Appreciate gynecology's collaboration.

## 2021-09-27 NOTE — Progress Notes (Signed)
Pt with increase pain, found crying in room while guarding her abdomen.  Already had PO PRN pain medication but pt states no relief.  Given IV PRN pain medication. Will have primary nurse re-evaluate pain.

## 2021-09-27 NOTE — Progress Notes (Signed)
HD#3 POPelvic abscess/Ileus  POD#16 TLH/BS/LOA/TVT exact Sling/Cystoscopy  Subjective: Patient reports:  Decreased abdominal gas pain from 8/10 yesterday to 7/10 this morning.  Didn't receive Dilaudid IV over night, received PO Hydrocodone.  Walked more last evening.  Hungry, having breakfast at visit.  No N/V, no reflux.  Little passage of gas.  Soft stools.  No vaginal bleeding. No chills.  Objective: I have reviewed patient's vital signs, intake and output, medications, and labs. 11/29 0701 - 11/30 0700 In: 93.3 [I.V.:77.4; IV Piggyback:15.9] Out: -   Lab Results  Component Value Date   WBC 9.1 09/27/2021   HGB 10.8 (L) 09/27/2021   HCT 36.1 09/27/2021   MCV 76.3 (L) 09/27/2021   PLT 384 09/27/2021   Physical Exam: Vitals:   09/26/21 2037 09/27/21 0337  BP: 127/83 131/73  Pulse: 84 88  Resp: 20 20  Temp: 98.1 F (36.7 C) 98 F (36.7 C)  SpO2: 96% 95%   Lungs clear bilaterally  RCR  Abdo:  Still mildly distended, but soft.  BS present.  No high pitch  No vaginal bleeding  Lower limbs normal    Assessment/Plan: POD#16 TLH/BS/LOA/TVT Sling with PO Pelvic Abscesses. Responded to Zosyn IV given x 48 hours which was stopped last evening.  Remains afebrile with normal WBC count on Flagyl and Augmentin PO.  Ileus improved.  Stopped IV Dilaudid.  Will wean from PO Hydrocodone today, switch to NSAIDs and increase mobilization with frequent walks and sitting rather than laying down.  Repeat CT scan scheduled today.  May d/c home this evening if Ileus resolving.    Princess Bruins, MD 8:32 AM 09/27/2021

## 2021-09-27 NOTE — Progress Notes (Signed)
Pharmacy Antibiotic Note  Brianna Gutierrez is a 45 y.o. female admitted on 09/24/2021.  Pharmacy has been consulted for Zosyn dosing for postop pelvic abscess.  She was previously on Zosyn, then changed to PO antibiotics.  Now with abdominal distention, concerning for SBO, transitioning back to NPO and IV antibiotics.    Plan: Zosyn 3.375g IV Q8H infused over 4hrs.  Follow up renal function, culture results, and clinical course.   Height: 5\' 2"  (157.5 cm) Weight: 87.5 kg (192 lb 14.4 oz) IBW/kg (Calculated) : 50.1  Temp (24hrs), Avg:98.4 F (36.9 C), Min:98 F (36.7 C), Max:98.9 F (37.2 C)  Recent Labs  Lab 09/24/21 1925 09/25/21 0518 09/26/21 0502 09/27/21 0521  WBC 13.5* 10.4 9.9 9.1  CREATININE 0.76  --  0.65 0.67    Estimated Creatinine Clearance: 91.3 mL/min (by C-G formula based on SCr of 0.67 mg/dL).    Allergies  Allergen Reactions   Tramadol Other (See Comments)    Headache    Antimicrobials this admission: 11/27 Zosyn >> 11/28 11/29 Metronidazole >> 11/30 11/29 Augmentin >> 11/30 11/30 Zosyn >>   Dose adjustments this admission:  Microbiology results:  Thank you for allowing pharmacy to be a part of this patient's care.  Gretta Arab PharmD, BCPS Clinical Pharmacist [facility] main pharmacy [phone] 09/27/2021 6:09 PM

## 2021-09-27 NOTE — Consult Note (Addendum)
Medical Consultation   Brianna Gutierrez  QAS:[id number]  DOB: May 02, 1976  DOA: 09/24/2021  PCP: [name], NP   Outpatient Specialists: Gynecology.   Requesting physician: Dr. Claretta Fraise  Reason for consultation: Incidentally found acute pulmonary embolism, involving right lower lobe segmental and subsegmental arteries with flattening of the interventricular septum raising suspicion for degree of right heart strain.  Post hysterectomy with bilateral salpingectomy on 09/11/2021, surgical pathology was benign, negative for malignancy.   History of Present Illness: Brianna Gutierrez is an 45 y.o. female with past medical history significant for essential hypertension, obesity with BMI of 35, ongoing tobacco use, chronic anxiety/depression, post hysterectomy and bilateral salpingectomy on 09/11/2021 by gynecology, Dr. Quincy Simmonds, who presented to [facility] ED on 09/24/2021 due to severe diffuse abdominal pain worse in lower abdominal quadrants x2 days.  Associated with nausea with no vomiting.  No reported fevers, urinary symptoms, vaginal bleeding, or discharge.  She was seen in her gynecologist office on 09/18/21, started on Bactrim for presumptive UTI, the antibiotic was stopped after urine culture returned negative.    Upon presentation to the ED on 09/24/2021, CT abdomen pelvis with contrast revealed possible developing abscess in the surgical bed and left adnexa.  Patient was admitted by the gynecology team and was started on IV antibiotics, initially on Zosyn then was switched to Augmentin and p.o. Flagyl on 09/26/2021.  Hospital course was complicated by intractable nausea and abdominal distention.  There was clinical suspicion for mild/moderate ileus which resolved as she was able to have a bowel movement on 09/26/21.  Positive flatulence today but vomited after drinking her oral contrast for her CT scan.    She had a repeat CT abdomen and pelvis with contrast done on  09/27/2021 which revealed incidentally found acute pulmonary emboli in the right lower lobe segmental and subsegmental arteries with flattening of the interventricular septum raising suspicion for a degree of right heart strain.  Also revealed complex multiloculated collection in the pelvis concerning for developing abscess following recent hysterectomy.  Due to incidentally found acute pulmonary embolism with concern for right heart strain, [facility], hospitalist service, was consulted to assist with the medical management of her acute medical condition.    09/27/2021: Patient was seen and examined at her bedside this afternoon.  She reports after her surgery on 09/11/2021 she has been minimally active, mostly staying in bed.  No recent lengthy trips.  She continues to smoke 1 pack of cigarettes lasting about 3 days.  She denies having any chest pain or cough or hemoptysis.  She endorses dyspnea with ambulation.  She has tenderness with palpation of her lower extremities bilaterally.  She denies family history of blood clots.  Denies personal history of GI bleed or head trauma.     Review of Systems:  ROS As per HPI otherwise 10 point review of systems negative.     Past Medical History: Past Medical History:  Diagnosis Date   Abnormal Pap smear of cervix    Anxiety    Depression    Dysmenorrhea    Eczema    GERD (gastroesophageal reflux disease)    History of COVID-19 10/24/2020   loss of taste and smellbody aches and cough x 4 days all symptoms resolved   Hypertension    Pre-diabetes    Rash 08/31/2021   comes and goes on legs saw dermetology no rash now ? food allergy   SUI (  stress urinary incontinence, female)    Trichomonas infection 02/2021   Uterine fibroid     Past Surgical History: Past Surgical History:  Procedure Laterality Date   BLADDER SUSPENSION N/A 09/11/2021   Procedure: TRANSVAGINAL TAPE (TVT) PROCEDURE;  Surgeon: [name], MD;  Location: [facility];  Service: Gynecology;  Laterality: N/A;   CESAREAN SECTION  1997   x 1   CHOLECYSTECTOMY N/A 01/14/2019   Procedure: LAPAROSCOPIC CHOLECYSTECTOMY WITH POSSIBLE  INTRAOPERATIVE CHOLANGIOGRAM;  Surgeon: Armandina Gemma, MD;  Location: [facility];  Service: General;  Laterality: N/A;   CYSTOSCOPY N/A 09/11/2021   Procedure: Consuela Mimes;  Surgeon: [name], MD;  Location: [facility];  Service: Gynecology;  Laterality: N/A;   DILITATION & CURRETTAGE/HYSTROSCOPY WITH HYDROTHERMAL ABLATION N/A 06/22/2016   Procedure: DILATATION & CURETTAGE/HYSTEROSCOPY WITH HYDROTHERMAL ABLATION;  Surgeon: Shelly Bombard, MD;  Location: [facility];  Service: Gynecology;  Laterality: N/A;   TOTAL LAPAROSCOPIC HYSTERECTOMY WITH SALPINGECTOMY Bilateral 09/11/2021   Procedure: TOTAL LAPAROSCOPIC HYSTERECTOMY WITH SALPINGECTOMY;  Surgeon: [name], MD;  Location: [facility];  Service: Gynecology;  Laterality: Bilateral;   TUBAL LIGATION     09-28-2015     Allergies:   Allergies  Allergen Reactions   Tramadol Other (See Comments)    Headache     Social History:  reports that she has been smoking cigarettes. She has a 2.00 pack-year smoking history. She has never used smokeless tobacco. She reports that she does not currently use alcohol. She reports that she does not use drugs.   Family History: Family History  Problem Relation Age of Onset   Hypertension Maternal Grandmother    Diabetes Maternal Grandfather    Colon cancer Neg Hx    Esophageal cancer Neg Hx    Rectal cancer Neg Hx    Stomach cancer Neg Hx     Physical Exam: Vitals:   09/26/21 2037 09/27/21 0337 09/27/21 1332 09/27/21 1617  BP: 127/83 131/73 135/81 139/82  Pulse: 84 88 86 79  Resp: 20 20 18 20   Temp: 98.1 F (36.7 C) 98 F (36.7 C) 98.9 F (37.2 C) 98.4 F (36.9 C)  TempSrc: Axillary Oral Oral Oral  SpO2: 96% 95% 95% 92%    Constitutional: Obese,  alert and awake, oriented x3, not in any acute distress. Eyes: PERLA, EOMI, irises appear normal, anicteric sclera,  ENMT: external ears and nose appear normal, normal hearing.            Lips appears normal, oropharynx mucosa, tongue, posterior pharynx appear normal  Neck: neck appears normal, no masses, normal ROM, no thyromegaly, no JVD  CVS: S1-S2 clear, no murmur rubs or gallops, no LE edema, normal pedal pulses  Respiratory:  clear to auscultation bilaterally, no wheezing, rales or rhonchi. Respiratory effort normal. No accessory muscle use.  Abdomen: Mildly distended, lower quadrant abdominal tenderness with gentle palpation.  Normal bowel sounds present, no hepatosplenomegaly, no hernias  Musculoskeletal: : no cyanosis or clubbing.  Trace lower extremity edema noted bilaterally.  Tenderness with palpation of calves bilaterally.                       Neuro: Cranial nerves II-XII intact, strength, sensation, reflexes Psych: judgement and insight appear normal, stable mood and affect, mental status Skin: no rashes or lesions or ulcers, no induration or nodules     Data reviewed:  I have personally reviewed  following labs and imaging studies Labs:  CBC: Recent Labs  Lab 09/24/21 1925 09/25/21 0518 09/26/21 0502 09/27/21 0521  WBC 13.5* 10.4 9.9 9.1  NEUTROABS 9.4* 7.2 6.8 6.2  HGB 12.0 10.9* 11.0* 10.8*  HCT 39.0 35.3* 35.4* 36.1  MCV 74.9* 74.5* 74.7* 76.3*  PLT 512* 397 410* 016    Basic Metabolic Panel: Recent Labs  Lab 09/24/21 1925 09/26/21 0502 09/27/21 0521  NA 135 135 136  K 5.5* 3.6 3.6  CL 105 103 101  CO2 23 25 27   GLUCOSE 105* 109* 115*  BUN 16 9 10   CREATININE 0.76 0.65 0.67  CALCIUM 8.6* 8.6* 8.5*   GFR Estimated Creatinine Clearance: 92.8 mL/min (by C-G formula based on SCr of 0.67 mg/dL). Liver Function Tests: Recent Labs  Lab 09/24/21 1925  AST 39  ALT 23  ALKPHOS 114  BILITOT 1.2  PROT 7.7  ALBUMIN 3.6   No results for input(s):  LIPASE, AMYLASE in the last 168 hours. No results for input(s): AMMONIA in the last 168 hours. Coagulation profile No results for input(s): INR, PROTIME in the last 168 hours.  Cardiac Enzymes: No results for input(s): CKTOTAL, CKMB, CKMBINDEX, TROPONINI in the last 168 hours. BNP: Invalid input(s): POCBNP CBG: No results for input(s): GLUCAP in the last 168 hours. D-Dimer No results for input(s): DDIMER in the last 72 hours. Hgb A1c No results for input(s): HGBA1C in the last 72 hours. Lipid Profile No results for input(s): CHOL, HDL, LDLCALC, TRIG, CHOLHDL, LDLDIRECT in the last 72 hours. Thyroid function studies No results for input(s): TSH, T4TOTAL, T3FREE, THYROIDAB in the last 72 hours.  Invalid input(s): FREET3 Anemia work up No results for input(s): VITAMINB12, FOLATE, FERRITIN, TIBC, IRON, RETICCTPCT in the last 72 hours. Urinalysis    Component Value Date/Time   COLORURINE YELLOW 09/24/2021 2030   APPEARANCEUR HAZY (A) 09/24/2021 2030   LABSPEC 1.013 09/24/2021 2030   PHURINE 7.0 09/24/2021 2030   GLUCOSEU NEGATIVE 09/24/2021 2030   HGBUR MODERATE (A) 09/24/2021 2030   [place] NEGATIVE 09/24/2021 2030   BILIRUBINUR negative 11/22/2020 1721   [place] 09/24/2021 2030   PROTEINUR NEGATIVE 09/24/2021 2030   UROBILINOGEN 0.2 11/22/2020 1721   UROBILINOGEN 1.0 07/07/2014 1421   NITRITE NEGATIVE 09/24/2021 2030   LEUKOCYTESUR LARGE (A) 09/24/2021 2030     Microbiology Recent Results (from the past 240 hour(s))  Resp Panel by RT-PCR (Flu A&B, Covid) Nasopharyngeal Swab     Status: None   Collection Time: 09/25/21 12:02 AM   Specimen: Nasopharyngeal Swab; Nasopharyngeal(NP) swabs in vial transport medium  Result Value Ref Range Status   SARS Coronavirus 2 by RT PCR NEGATIVE NEGATIVE Final    Comment: (NOTE) SARS-CoV-2 target nucleic acids are NOT DETECTED.  The SARS-CoV-2 RNA is generally detectable in upper respiratory specimens during the acute  phase of infection. The lowest concentration of SARS-CoV-2 viral copies this assay can detect is 138 copies/mL. A negative result does not preclude SARS-Cov-2 infection and should not be used as the sole basis for treatment or other patient management decisions. A negative result may occur with  improper specimen collection/handling, submission of specimen other than nasopharyngeal swab, presence of viral mutation(s) within the areas targeted by this assay, and inadequate number of viral copies(<138 copies/mL). A negative result must be combined with clinical observations, patient history, and epidemiological information. The expected result is Negative.  Fact Sheet for Patients:  EntrepreneurPulse.com.au  Fact Sheet for Healthcare Providers:  IncredibleEmployment.be  This test is  no t yet approved or cleared by the [place] and  has been authorized for detection and/or diagnosis of SARS-CoV-2 by FDA under an Emergency Use Authorization (EUA). This EUA will remain  in effect (meaning this test can be used) for the duration of the COVID-19 declaration under Section 564(b)(1) of the Act, 21 U.S.C.section 360bbb-3(b)(1), unless the authorization is terminated  or revoked sooner.       Influenza A by PCR NEGATIVE NEGATIVE Final   Influenza B by PCR NEGATIVE NEGATIVE Final    Comment: (NOTE) The Xpert Xpress SARS-CoV-2/FLU/RSV plus assay is intended as an aid in the diagnosis of influenza from Nasopharyngeal swab specimens and should not be used as a sole basis for treatment. Nasal washings and aspirates are unacceptable for Xpert Xpress SARS-CoV-2/FLU/RSV testing.  Fact Sheet for Patients: EntrepreneurPulse.com.au  Fact Sheet for Healthcare Providers: IncredibleEmployment.be  This test is not yet approved or cleared by the [place] FDA and has been authorized for detection and/or diagnosis of  SARS-CoV-2 by FDA under an Emergency Use Authorization (EUA). This EUA will remain in effect (meaning this test can be used) for the duration of the COVID-19 declaration under Section 564(b)(1) of the Act, 21 U.S.C. section 360bbb-3(b)(1), unless the authorization is terminated or revoked.  Performed at [facility], [place] [address], [place],  [address]        Inpatient Medications:   Scheduled Meds:  amLODipine  10 mg Oral QHS   amoxicillin-clavulanate  1 tablet Oral Q12H   docusate sodium  100 mg Oral BID   loratadine  10 mg Oral QHS   metroNIDAZOLE  500 mg Oral Q8H   prenatal multivitamin  1 tablet Oral Q1200   sertraline  100 mg Oral QHS   sodium chloride (PF)       Continuous Infusions:   Radiological Exams on Admission: CT ABDOMEN PELVIS W CONTRAST  Result Date: 09/27/2021 CLINICAL DATA:  Postop day fifth from hysterectomy with bilateral salpingectomy, elevated white count and pain, possible abscess seen on CT abdomen/pelvis 09/24/2021 EXAM: CT ABDOMEN AND PELVIS WITH CONTRAST TECHNIQUE: Multidetector CT imaging of the abdomen and pelvis was performed using the standard protocol following bolus administration of intravenous contrast. CONTRAST:  109mL OMNIPAQUE IOHEXOL 350 MG/ML SOLN COMPARISON:  CT abdomen/pelvis 09/24/2021 FINDINGS: Lower chest: Acute pulmonary emboli are seen in right lower lobe segmental and subsegmental arteries. Linear opacities in the lung bases likely reflect atelectasis. There is flattening of the interventricular septum. Hepatobiliary: There is an ill-defined hypodense lesion in the left hepatic lobe measuring up to approximately 1.5 cm. No other focal lesions are seen. The gallbladder is surgically absent. There is no biliary ductal dilatation. Pancreas: Unremarkable. Spleen: Unremarkable. Adrenals/Urinary Tract: The adrenals are unremarkable. The right kidney is smaller than the left. The right kidney is slightly hypoenhancing  compared to the left on the early phase images, but there is excretion of contrast into the collecting systems bilaterally on the delayed phase images. A hypodense lesion in the right upper pole most likely reflects a small cyst. There is a 5 mm nonobstructing left renal stone, unchanged. There are no other focal lesions or stones. There is no hydronephrosis or hydroureter. The bladder is decompressed and not well assessed. Stomach/Bowel: The stomach is unremarkable. There is no evidence of bowel obstruction. There is no abnormal bowel wall thickening or inflammatory change. Appendix is normal. Vascular/Lymphatic: The abdominal aorta is normal in course and caliber. The major branch vessels are patent. The  main portal and splenic veins are patent. There is no abdominal or pelvic lymphadenopathy. Reproductive: The patient is status post hysterectomy and bilateral salpingectomy. The right ovary is identified and appears normal. There is a complex, multiloculated fluid collection in the surgical bed measuring up to approximally 4.8 cm AP by 4.1 cm TV by 4.6 cm cc. A rounded hypodensity adjacent to the superolateral aspect of this collection measuring approximally 2.2 cm may reflect the left ovary surrounded by the collection (2-73). A separate loculated portion of the collection measuring 2.4 cm x 1.7 cm by 2.0 cm is noted more posteriorly and inferiorly in the pelvis with incomplete peripheral enhancement (6-75). This collection abuts the inferior margin of the sigmoid colon which may be adhered (6-59). Other: There is trace free fluid in the left hemipelvis adjacent to the above-described collection. There is no free intraperitoneal air. Musculoskeletal: There is no acute osseous abnormality or aggressive osseous lesion. IMPRESSION: 1. Acute pulmonary emboli in right lower lobe segmental and subsegmental arteries with flattening of the interventricular septum raising suspicion for a degree of right heart strain. 2.  Complex multiloculated collection in the pelvis is concerning for developing abscess following recent hysterectomy. An ovoid hypodensity in the left hemipelvis adjacent to the collection may reflect the left ovary surrounded by abscess. 3. Unchanged indeterminate lesion in the left hepatic lobe. Nonemergent liver MRI with and without contrast is recommended for characterization. 4. Nonobstructing left renal stone. These results were called by telephone at the time of interpretation on 09/27/2021 at 3:45 pm to provider Dr Sumner Boast, who verbally acknowledged these results. Electronically Signed   By: Valetta Mole M.D.   On: 09/27/2021 15:46    Impression/Recommendations Principal Problem:   Pelvic infection in female  Incidentally found acute pulmonary embolism, involving right lower lobe segmental and subsegmental pulmonary arteries with concern for right heart strain, seen on CT abdomen pelvis with contrast done on 09/27/2021.   With flattening of the interventricular septum raising suspicion for degree of right heart strain.   Post hysterectomy with bilateral salpingectomy on 09/11/2021, Surgical pathology was benign, negative for malignancy. Sedentary lifestyle, obesity, ongoing tobacco use, postsurgery. Start heparin drip, continue for 24 to 48 hours, if right heart strain is present on 2D echo.   Will obtain BNP and troponin for risk stratification Obtain complete 2D echo and bilateral lower extremity Doppler ultrasound, follow results. Maintain O2 saturation greater than 92% Consider Eliquis for 3 to 6 months, since no prior history of thromboembolism, when close to DC if no planned procedures.  Complex multiloculated collection in the pelvis with concern for developing abscess, measuring up to 4.8 cm x 4.1 cm x 4.6 cm, POA CT scan findings concerning for developing abscess following recent hysterectomy. Consider IR consult for possible abscess drainage, fluid analysis. Will switch back  to Zosyn since no plan for discharge today. Pain control with IV narcotics as needed  Ill-defined hypodense lesion in the left hepatic lobe measuring up to approximately 1.5 cm No other focal lesions seen Outpatient surveillance.  Right small kidney cyst/5 mm nonobstructing left renal stone No hydronephrosis or hydroureter. Monitor  Abdominal distention, suspect ileus No evidence of bowel obstruction seen on CT scan No abnormal bowel wall thickening or inflammatory changes. Continue to mobilize as tolerated Will start gentle IV fluid hydration LR 50 cc/h x 2 days. Optimize magnesium and potassium levels. Simethicone 80 mg 4 times daily Dulcolax suppository as needed Will obtain labs in the morning.  Tobacco use disorder  Tobacco cessation counseled on at bedside  Essential hypertension BP is at goal, stable Continue home oral antihypertensives, Norvasc   Thank you for this consultation.  [facility] hospitalist team will follow the patient with you.   Time Spent:   Critical care time: 55 minutes   Kayleen Memos M.D. [facility] 09/27/2021, 4:41 PM

## 2021-09-27 NOTE — Progress Notes (Signed)
Subjective: Patient reports continued severe lower abdominal pain, she has been taking percocet for pain stating the ibuprofen didn't help. She recently had dilaudid for severe break through pain, this lowered her pain from an 8/10 to a 7/10 in severity. She had an episode of emesis earlier today. States she feels very bloated and like she is having gas pains. Only passes gas one time today. No BM today.  She does report some pain in her right thigh.    Objective: I have reviewed patient's vital signs, labs, and radiology results.  Vitals:   09/27/21 0337 09/27/21 1332 09/27/21 1617 09/27/21 1700  BP: 131/73 135/81 139/82   Pulse: 88 86 79   Temp: 98 F (36.7 C) 98.9 F (37.2 C) 98.4 F (36.9 C)   Resp: 20 18 20    Height:    5\' 2"  (1.575 m)  Weight:    87.5 kg  SpO2: 95% 95% 92%   TempSrc: Oral Oral Oral   BMI (Calculated):    35.27   CBC Latest Ref Rng & Units 09/27/2021 09/26/2021 09/25/2021  WBC 4.0 - 10.5 K/uL 9.1 9.9 10.4  Hemoglobin 12.0 - 15.0 g/dL 10.8(L) 11.0(L) 10.9(L)  Hematocrit 36.0 - 46.0 % 36.1 35.4(L) 35.3(L)  Platelets 150 - 400 K/uL 384 410(H) 397    I/O last 3 completed shifts: In: 686 [I.V.:623.3; IV Piggyback:62.7] Out: -  No intake/output data recorded.  General: alert, cooperative, no apparent distress CV: S1, S2 normal Lungs: CTAB Abd: soft, very distended, hyperactive BS, tender in lower abdomen, no rebound, no guarding Incisions: well healed Ext: bilateral calf tenderness, no edema  CT-scan of abdomen and pelvis COMPARISON:  CT abdomen/pelvis 09/24/2021   FINDINGS: Lower chest: Acute pulmonary emboli are seen in right lower lobe segmental and subsegmental arteries. Linear opacities in the lung bases likely reflect atelectasis. There is flattening of the interventricular septum.   Hepatobiliary: There is an ill-defined hypodense lesion in the left hepatic lobe measuring up to approximately 1.5 cm. No other focal lesions are seen. The  gallbladder is surgically absent. There is no biliary ductal dilatation.   Pancreas: Unremarkable.   Spleen: Unremarkable.   Adrenals/Urinary Tract: The adrenals are unremarkable.   The right kidney is smaller than the left. The right kidney is slightly hypoenhancing compared to the left on the early phase images, but there is excretion of contrast into the collecting systems bilaterally on the delayed phase images. A hypodense lesion in the right upper pole most likely reflects a small cyst. There is a 5 mm nonobstructing left renal stone, unchanged. There are no other focal lesions or stones. There is no hydronephrosis or hydroureter. The bladder is decompressed and not well assessed.   Stomach/Bowel: The stomach is unremarkable. There is no evidence of bowel obstruction. There is no abnormal bowel wall thickening or inflammatory change. Appendix is normal.   Vascular/Lymphatic: The abdominal aorta is normal in course and caliber. The major branch vessels are patent. The main portal and splenic veins are patent. There is no abdominal or pelvic lymphadenopathy.   Reproductive: The patient is status post hysterectomy and bilateral salpingectomy. The right ovary is identified and appears normal. There is a complex, multiloculated fluid collection in the surgical bed measuring up to approximally 4.8 cm AP by 4.1 cm TV by 4.6 cm cc. A rounded hypodensity adjacent to the superolateral aspect of this collection measuring approximally 2.2 cm may reflect the left ovary surrounded by the collection (2-73). A separate loculated portion of the  collection measuring 2.4 cm x 1.7 cm by 2.0 cm is noted more posteriorly and inferiorly in the pelvis with incomplete peripheral enhancement (6-75). This collection abuts the inferior margin of the sigmoid colon which may be adhered (6-59).   Other: There is trace free fluid in the left hemipelvis adjacent to the above-described collection. There  is no free intraperitoneal air.   Musculoskeletal: There is no acute osseous abnormality or aggressive osseous lesion.   IMPRESSION: 1. Acute pulmonary emboli in right lower lobe segmental and subsegmental arteries with flattening of the interventricular septum raising suspicion for a degree of right heart strain. 2. Complex multiloculated collection in the pelvis is concerning for developing abscess following recent hysterectomy. An ovoid hypodensity in the left hemipelvis adjacent to the collection may reflect the left ovary surrounded by abscess. 3. Unchanged indeterminate lesion in the left hepatic lobe. Nonemergent liver MRI with and without contrast is recommended for characterization. 4. Nonobstructing left renal stone.    Assessment/Plan: 1) POD #16 s/p TLH/BS/LOA/TVT admitted on 11/27 with a post op pelvic abscess. She has remained afebrile and her WBC has normalized. She continues to have signs of a pelvic abscess, current measurements are 4.8 x 4.1 x 4.6 cm (down from 5.3 x 6.2 cm) and 2.4 x 1.7 x 2.0 cm (down from 2.6 x 2.1 cm). She has not had an improvement in pain, but is very distended, exam is somewhat concerning for SBO, but no signs of SBO on CT scan. She is taking narcotics around the clock which I believe are contributing to her bowel distention. She had emesis earlier today.  Plan: will stop po antibiotics and resume zosyn Will make NPO with sips of water and ice chips Will try to limit narcotics Will give IV tylenol  2) Pulmonary embolism Hospitalist has been consulted and will manage.    LOS: 2 days    Salvadore Dom 09/27/2021, 5:09 PM

## 2021-09-28 ENCOUNTER — Encounter (HOSPITAL_COMMUNITY): Payer: Self-pay | Admitting: Obstetrics and Gynecology

## 2021-09-28 ENCOUNTER — Inpatient Hospital Stay (HOSPITAL_COMMUNITY): Payer: BC Managed Care – PPO

## 2021-09-28 DIAGNOSIS — N739 Female pelvic inflammatory disease, unspecified: Secondary | ICD-10-CM | POA: Diagnosis not present

## 2021-09-28 DIAGNOSIS — I2699 Other pulmonary embolism without acute cor pulmonale: Secondary | ICD-10-CM | POA: Diagnosis not present

## 2021-09-28 DIAGNOSIS — I2602 Saddle embolus of pulmonary artery with acute cor pulmonale: Secondary | ICD-10-CM | POA: Diagnosis not present

## 2021-09-28 LAB — CBC WITH DIFFERENTIAL/PLATELET
Abs Immature Granulocytes: 0.05 10*3/uL (ref 0.00–0.07)
Basophils Absolute: 0 10*3/uL (ref 0.0–0.1)
Basophils Relative: 0 %
Eosinophils Absolute: 0.4 10*3/uL (ref 0.0–0.5)
Eosinophils Relative: 3 %
HCT: 36.7 % (ref 36.0–46.0)
Hemoglobin: 11.5 g/dL — ABNORMAL LOW (ref 12.0–15.0)
Immature Granulocytes: 0 %
Lymphocytes Relative: 12 %
Lymphs Abs: 1.4 10*3/uL (ref 0.7–4.0)
MCH: 23.1 pg — ABNORMAL LOW (ref 26.0–34.0)
MCHC: 31.3 g/dL (ref 30.0–36.0)
MCV: 73.7 fL — ABNORMAL LOW (ref 80.0–100.0)
Monocytes Absolute: 0.7 10*3/uL (ref 0.1–1.0)
Monocytes Relative: 6 %
Neutro Abs: 9.2 10*3/uL — ABNORMAL HIGH (ref 1.7–7.7)
Neutrophils Relative %: 79 %
Platelets: 420 10*3/uL — ABNORMAL HIGH (ref 150–400)
RBC: 4.98 MIL/uL (ref 3.87–5.11)
RDW: 16.1 % — ABNORMAL HIGH (ref 11.5–15.5)
WBC: 11.8 10*3/uL — ABNORMAL HIGH (ref 4.0–10.5)
nRBC: 0 % (ref 0.0–0.2)

## 2021-09-28 LAB — COMPREHENSIVE METABOLIC PANEL
ALT: 22 U/L (ref 0–44)
AST: 17 U/L (ref 15–41)
Albumin: 3.5 g/dL (ref 3.5–5.0)
Alkaline Phosphatase: 113 U/L (ref 38–126)
Anion gap: 7 (ref 5–15)
BUN: 6 mg/dL (ref 6–20)
CO2: 25 mmol/L (ref 22–32)
Calcium: 8.8 mg/dL — ABNORMAL LOW (ref 8.9–10.3)
Chloride: 102 mmol/L (ref 98–111)
Creatinine, Ser: 0.72 mg/dL (ref 0.44–1.00)
GFR, Estimated: >60 mL/min (ref >60)
Glucose, Bld: 113 mg/dL — ABNORMAL HIGH (ref 70–99)
Potassium: 3.4 mmol/L — ABNORMAL LOW (ref 3.5–5.1)
Sodium: 134 mmol/L — ABNORMAL LOW (ref 135–145)
Total Bilirubin: 0.4 mg/dL (ref 0.3–1.2)
Total Protein: 7.4 g/dL (ref 6.5–8.1)

## 2021-09-28 LAB — HEMOGLOBIN A1C
Hgb A1c MFr Bld: 6.3 % — ABNORMAL HIGH (ref 4.8–5.6)
Mean Plasma Glucose: 134.11 mg/dL

## 2021-09-28 LAB — ECHOCARDIOGRAM COMPLETE
AR max vel: 2.04 cm2
AV Area VTI: 2.25 cm2
AV Area mean vel: 2.04 cm2
AV Mean grad: 8 mmHg
AV Peak grad: 15.5 mmHg
Ao pk vel: 1.97 m/s
Area-P 1/2: 3.12 cm2
Height: 62 in
S' Lateral: 3 cm
Weight: 3086.44 oz

## 2021-09-28 LAB — GLUCOSE, CAPILLARY
Glucose-Capillary: 124 mg/dL — ABNORMAL HIGH (ref 70–99)
Glucose-Capillary: 131 mg/dL — ABNORMAL HIGH (ref 70–99)
Glucose-Capillary: 137 mg/dL — ABNORMAL HIGH (ref 70–99)

## 2021-09-28 LAB — MAGNESIUM: Magnesium: 2 mg/dL (ref 1.7–2.4)

## 2021-09-28 LAB — HEPARIN LEVEL (UNFRACTIONATED)
Heparin Unfractionated: 0.13 IU/mL — ABNORMAL LOW (ref 0.30–0.70)
Heparin Unfractionated: 0.24 IU/mL — ABNORMAL LOW (ref 0.30–0.70)
Heparin Unfractionated: 0.44 IU/mL (ref 0.30–0.70)
Heparin Unfractionated: 0.47 IU/mL (ref 0.30–0.70)

## 2021-09-28 LAB — PHOSPHORUS: Phosphorus: 2.8 mg/dL (ref 2.5–4.6)

## 2021-09-28 MED ORDER — POTASSIUM CHLORIDE 10 MEQ/100ML IV SOLN
10.0000 meq | INTRAVENOUS | Status: DC
  Administered 2021-09-28: 10 meq via INTRAVENOUS
  Filled 2021-09-28 (×2): qty 100

## 2021-09-28 MED ORDER — HEPARIN BOLUS VIA INFUSION
2000.0000 [IU] | Freq: Once | INTRAVENOUS | Status: AC
  Administered 2021-09-28: 2000 [IU] via INTRAVENOUS
  Filled 2021-09-28: qty 2000

## 2021-09-28 MED ORDER — WITCH HAZEL-GLYCERIN EX PADS
MEDICATED_PAD | CUTANEOUS | Status: DC | PRN
  Filled 2021-09-28: qty 100

## 2021-09-28 MED ORDER — WITCH HAZEL-GLYCERIN EX PADS: MEDICATED_PAD | CUTANEOUS | Status: DC | PRN

## 2021-09-28 MED ORDER — INSULIN ASPART 100 UNIT/ML IJ SOLN
0.0000 [IU] | Freq: Three times a day (TID) | INTRAMUSCULAR | Status: DC
  Administered 2021-09-28 – 2021-09-29 (×3): 1 [IU] via SUBCUTANEOUS

## 2021-09-28 MED ORDER — INSULIN ASPART 100 UNIT/ML IJ SOLN: 0.0000 [IU] | Freq: Every day | INTRAMUSCULAR | Status: DC

## 2021-09-28 MED ORDER — POTASSIUM CHLORIDE CRYS ER 20 MEQ PO TBCR
40.0000 meq | EXTENDED_RELEASE_TABLET | Freq: Two times a day (BID) | ORAL | Status: AC
  Administered 2021-09-28 (×2): 40 meq via ORAL
  Filled 2021-09-28 (×2): qty 2

## 2021-09-28 MED ORDER — METFORMIN HCL 500 MG PO TABS: 500.0000 mg | ORAL_TABLET | Freq: Two times a day (BID) | ORAL | Status: DC

## 2021-09-28 MED ORDER — SENNOSIDES-DOCUSATE SODIUM 8.6-50 MG PO TABS
2.0000 | ORAL_TABLET | Freq: Two times a day (BID) | ORAL | Status: DC
  Administered 2021-09-28 (×2): 2 via ORAL
  Filled 2021-09-28 (×2): qty 2

## 2021-09-28 NOTE — Progress Notes (Signed)
Pt reports pain on IV site-KCL infusion site. Checked patency-still in the vein. Turned off temporarily.

## 2021-09-28 NOTE — Progress Notes (Signed)
Subjective: Patient reports that her pain is down to a 6/10 this am, reports only having IV tylenol overnight. She has been getting simethicone and had an enema with resultant BM and passage of gas. Still distended, but feels less distended. Not SOB.    Objective: I have reviewed patient's vital signs and labs. Vitals:   09/27/21 2127 09/27/21 2356 09/28/21 0358 09/28/21 0426  BP: (!) 146/98  121/80 128/83  Pulse: 98  83 80  Temp: 99.1 F (37.3 C)  98.3 F (36.8 C) 98.5 F (36.9 C)  Resp: 20 15 20 18   Height:      Weight:      SpO2: 94%  92% 94%  TempSrc: Oral  Oral Oral  BMI (Calculated):        CBC Latest Ref Rng & Units 09/28/2021 09/27/2021 09/26/2021  WBC 4.0 - 10.5 K/uL 11.8(H) 9.1 9.9  Hemoglobin 12.0 - 15.0 g/dL 11.5(L) 10.8(L) 11.0(L)  Hematocrit 36.0 - 46.0 % 36.7 36.1 35.4(L)  Platelets 150 - 400 K/uL 420(H) 384 410(H)      General: alert, cooperative, and no distress Resp: clear to auscultation bilaterally Cardio: S1, S2 normal GI: soft, tender in BLQ, no rebound, no guarding, active bowel sounds, distended Extremities: bilateral calf tenderness, no edema   Assessment/Plan: 1) POD # 17 s/p TLH/LOA/TVT admitted on 11/27 with post op infection, suspected abscess. She has remained afebrile and her WBC normalized with IV Zosyn. She was switched from Zosyn to oral antibiotics in preparation for D/C, then was diagnosed with PE yesterday afternoon. This am her WBC has increased, unclear if this is from her infection or recent diagnosis of a PE. She was switched back to Zosyn last night from oral antibiotics. Her pain is improving with some improvement of her ileus.  -A consultation has been placed with interventional radiology about the possibility of abscess drainage.  -Would continue with IV antibiotics at this time.   2) Ileus, some improvement overnight but she remains quite distended. Discussed ambulation and limiting narcotic use with the patient. -Further  management per Hospitalist  3) Pulmonary embolism, on heparin and stable -Management per hospitalist  LOS: 3 days    Salvadore Dom 09/28/2021, 8:05 AM

## 2021-09-28 NOTE — Progress Notes (Signed)
ANTICOAGULATION CONSULT NOTE - follow up  Pharmacy Consult for Heparin Indication: pulmonary embolus  Allergies  Allergen Reactions   Tramadol Other (See Comments)    Headache    Patient Measurements: Height: 5\' 2"  (157.5 cm) Weight: 87.5 kg (192 lb 14.4 oz) IBW/kg (Calculated) : 50.1 Last weight 90.3 kg (11/17) Heparin Dosing Weight: 70 kg  Vital Signs: Temp: 98.6 F (37 C) (12/01 1216) Temp Source: Oral (12/01 1216) BP: 130/89 (12/01 1216) Pulse Rate: 85 (12/01 1216)  Labs: Recent Labs    09/26/21 0502 09/27/21 0521 09/27/21 1851 09/27/21 1951 09/28/21 0003 09/28/21 0717 09/28/21 1314  HGB 11.0* 10.8*  --   --  11.5*  --   --   HCT 35.4* 36.1  --   --  36.7  --   --   PLT 410* 384  --   --  420*  --   --   APTT  --   --  45*  --   --   --   --   LABPROT  --   --  13.3  --   --   --   --   INR  --   --  1.0  --   --   --   --   HEPARINUNFRC  --   --   --   --  0.13* 0.47 0.24*  CREATININE 0.65 0.67  --   --  0.72  --   --   TROPONINIHS  --   --   --  2  --   --   --      Estimated Creatinine Clearance: 91.3 mL/min (by C-G formula based on SCr of 0.72 mg/dL).   Medical History: Past Medical History:  Diagnosis Date   Abnormal Pap smear of cervix    Anxiety    Depression    Dysmenorrhea    Eczema    GERD (gastroesophageal reflux disease)    History of COVID-19 10/24/2020   loss of taste and smellbody aches and cough x 4 days all symptoms resolved   Hypertension    Pre-diabetes    Rash 08/31/2021   comes and goes on legs saw dermetology no rash now ? food allergy   SUI (stress urinary incontinence, female)    Trichomonas infection 02/2021   Uterine fibroid     Medications:  Infusions:   heparin 1,500 Units/hr (09/28/21 0847)   lactated ringers Stopped (09/28/21 0601)   piperacillin-tazobactam (ZOSYN)  IV 3.375 g (09/28/21 1147)    Assessment: 67 yoF admitted on 11/27 for worsening abdominal pain s/p hysterectomy on 11/14.  Found to have PE  on 11/30 and pharmacy is consulted to dose Heparin. No current or prior anticoagulation.  SCDs have been on since 11/28 Baseline aPTT 45, PT/INR WNL  09/28/2021 HL 0.47 now therapeutic on 1500 units/hr Hgb up to 11.5, plts 420 Per RN no interruptions or bleeding  PM update 2:57 PM  HL now sub-therapeutic Will query RN regarding possible issues with the infusion  Goal of Therapy:  Heparin level 0.3-0.7 units/ml Monitor platelets by anticoagulation protocol: Yes   Plan:  Increase heparin infusion rate to 1650 units/hr Repeat heparin level in 6 hours  Daily heparin level and CBC   Ulice Dash, PharmD  09/28/2021, 2:27 PM

## 2021-09-28 NOTE — Consult Note (Signed)
Consulted for possible drainage of intra-abdominal abscess.    Dr Anselm Pancoast has reviewed imaging and states the fluid collection location and size is not amenable for draining at this time.  Recommend following collection and re-evaluating for drainage if it should enlarge.    IR available if needed.  Electronically Signed: Pasty Spillers, PA 09/28/2021, 9:03 AM

## 2021-09-28 NOTE — Progress Notes (Signed)
ANTICOAGULATION CONSULT NOTE - follow up  Pharmacy Consult for Heparin Indication: pulmonary embolus  Allergies  Allergen Reactions   Tramadol Other (See Comments)    Headache    Patient Measurements: Height: 5\' 2"  (157.5 cm) Weight: 87.5 kg (192 lb 14.4 oz) IBW/kg (Calculated) : 50.1 Last weight 90.3 kg (11/17) Heparin Dosing Weight: 70 kg  Vital Signs: Temp: 99.1 F (37.3 C) (11/30 2127) Temp Source: Oral (11/30 2127) BP: 146/98 (11/30 2127) Pulse Rate: 98 (11/30 2127)  Labs: Recent Labs    09/26/21 0502 09/27/21 0521 09/27/21 1851 09/27/21 1951 09/28/21 0003  HGB 11.0* 10.8*  --   --  11.5*  HCT 35.4* 36.1  --   --  36.7  PLT 410* 384  --   --  420*  APTT  --   --  45*  --   --   LABPROT  --   --  13.3  --   --   INR  --   --  1.0  --   --   HEPARINUNFRC  --   --   --   --  0.13*  CREATININE 0.65 0.67  --   --  0.72  TROPONINIHS  --   --   --  2  --      Estimated Creatinine Clearance: 91.3 mL/min (by C-G formula based on SCr of 0.72 mg/dL).   Medical History: Past Medical History:  Diagnosis Date   Abnormal Pap smear of cervix    Anxiety    Depression    Dysmenorrhea    Eczema    GERD (gastroesophageal reflux disease)    History of COVID-19 10/24/2020   loss of taste and smellbody aches and cough x 4 days all symptoms resolved   Hypertension    Pre-diabetes    Rash 08/31/2021   comes and goes on legs saw dermetology no rash now ? food allergy   SUI (stress urinary incontinence, female)    Trichomonas infection 02/2021   Uterine fibroid     Medications:  Infusions:   heparin 1,300 Units/hr (09/27/21 1741)   lactated ringers 75 mL/hr at 09/27/21 1952   piperacillin-tazobactam (ZOSYN)  IV 3.375 g (09/27/21 2020)    Assessment: 61 yoF admitted on 11/27 for worsening abdominal pain s/p hysterectomy on 11/14.  Found to have PE on 11/30 and pharmacy is consulted to dose Heparin. No current or prior anticoagulation.  SCDs have been on since  11/28 Baseline aPTT 45, PT/INR WNL  09/28/2021 HL 0.13 subtherapeutic on 1300 units/hr Hgb up to 11.5, plts 420 Per RN no interruptions or bleeding  Goal of Therapy:  Heparin level 0.3-0.7 units/ml Monitor platelets by anticoagulation protocol: Yes   Plan:  Give heparin 2000 units bolus IV x 1 Increase heparin IV infusion to 1500 units/hr Heparin level in 6 hours  Daily heparin level and CBC  Dolly Rias RPh 09/28/2021, 12:57 AM

## 2021-09-28 NOTE — Progress Notes (Signed)
Bilateral lower extremity venous duplex completed. Refer to "CV Proc" under chart review to view preliminary results.  09/28/2021 9:30 AM Kelby Aline., MHA, RVT, RDCS, RDMS

## 2021-09-28 NOTE — Progress Notes (Signed)
ANTICOAGULATION CONSULT NOTE - follow up  Pharmacy Consult for Heparin Indication: pulmonary embolus  Allergies  Allergen Reactions   Tramadol Other (See Comments)    Headache    Patient Measurements: Height: 5\' 2"  (157.5 cm) Weight: 87.5 kg (192 lb 14.4 oz) IBW/kg (Calculated) : 50.1 Last weight 90.3 kg (11/17) Heparin Dosing Weight: 70 kg  Vital Signs: Temp: 98.9 F (37.2 C) (12/01 2100) Temp Source: Oral (12/01 2100) BP: 132/79 (12/01 2100) Pulse Rate: 88 (12/01 2100)  Labs: Recent Labs    09/26/21 0502 09/27/21 0521 09/27/21 1851 09/27/21 1951 09/28/21 0003 09/28/21 0003 09/28/21 0717 09/28/21 1314 09/28/21 2106  HGB 11.0* 10.8*  --   --  11.5*  --   --   --   --   HCT 35.4* 36.1  --   --  36.7  --   --   --   --   PLT 410* 384  --   --  420*  --   --   --   --   APTT  --   --  45*  --   --   --   --   --   --   LABPROT  --   --  13.3  --   --   --   --   --   --   INR  --   --  1.0  --   --   --   --   --   --   HEPARINUNFRC  --   --   --   --  0.13*   < > 0.47 0.24* 0.44  CREATININE 0.65 0.67  --   --  0.72  --   --   --   --   TROPONINIHS  --   --   --  2  --   --   --   --   --    < > = values in this interval not displayed.     Estimated Creatinine Clearance: 91.3 mL/min (by C-G formula based on SCr of 0.72 mg/dL).   Medical History: Past Medical History:  Diagnosis Date   Abnormal Pap smear of cervix    Anxiety    Depression    Dysmenorrhea    Eczema    GERD (gastroesophageal reflux disease)    History of COVID-19 10/24/2020   loss of taste and smellbody aches and cough x 4 days all symptoms resolved   Hypertension    Pre-diabetes    Rash 08/31/2021   comes and goes on legs saw dermetology no rash now ? food allergy   SUI (stress urinary incontinence, female)    Trichomonas infection 02/2021   Uterine fibroid     Medications:  Infusions:   heparin 1,650 Units/hr (09/28/21 1524)   lactated ringers 75 mL/hr at 09/28/21 1849    piperacillin-tazobactam (ZOSYN)  IV 3.375 g (09/28/21 2030)    Assessment: 7 yoF admitted on 11/27 for worsening abdominal pain s/p hysterectomy on 11/14.  Found to have PE on 11/30 and pharmacy is consulted to dose Heparin. No current or prior anticoagulation.  SCDs have been on since 11/28 Baseline aPTT 45, PT/INR WNL  09/28/2021 2106 HL 0.44 therapeutic on 1650 units/hr No bleeding or infusion issues noted  Goal of Therapy:  Heparin level 0.3-0.7 units/ml Monitor platelets by anticoagulation protocol: Yes   Plan:  Continue heparin drip at 1650 units/hr Repeat heparin level in 6 hours  Daily heparin level and  CBC   Dolly Rias RPh 09/28/2021, 10:43 PM

## 2021-09-28 NOTE — Plan of Care (Signed)
  Problem: Clinical Measurements: Goal: Diagnostic test results will improve Outcome: Progressing Goal: Respiratory complications will improve Outcome: Progressing   Problem: Coping: Goal: Level of anxiety will decrease Outcome: Progressing   

## 2021-09-28 NOTE — Progress Notes (Signed)
PROGRESS NOTE  Brianna Gutierrez VPX:[id number] DOB: 18-Mar-1976 DOA: 09/24/2021 PCP: [name], NP  HPI/Recap of past 24 hours: Brianna Gutierrez is an 45 y.o. female with past medical history significant for essential hypertension, obesity with BMI of 35, ongoing tobacco use, chronic anxiety/depression, post hysterectomy and bilateral salpingectomy on 09/11/2021 by gynecology, Dr. Quincy Simmonds, who presented to [facility] ED on 09/24/2021 due to severe diffuse abdominal pain worse in lower abdominal quadrants x2 days.  Associated with nausea with no vomiting.  No reported fevers, urinary symptoms, vaginal bleeding, or discharge.  She was seen in her gynecologist office on 09/18/21, started on Bactrim for presumptive UTI, the antibiotic was stopped after urine culture returned negative.     Upon presentation to the ED on 09/24/2021, CT abdomen pelvis with contrast revealed possible developing abscess in the surgical bed and left adnexa.  Patient was admitted by the gynecology team and was started on IV antibiotics, initially on Zosyn then was switched to Augmentin and p.o. Flagyl on 09/26/2021.   Hospital course was complicated by intractable nausea and abdominal distention.  There was clinical suspicion for mild/moderate ileus which resolved as she was able to have a bowel movement on 09/26/21.  Positive flatulence today but vomited after drinking her oral contrast for her CT scan.     She had a repeat CT abdomen and pelvis with contrast done on 09/27/2021 which revealed incidentally found acute pulmonary emboli in the right lower lobe segmental and subsegmental arteries with flattening of the interventricular septum raising suspicion for a degree of right heart strain.  Also revealed complex multiloculated collection in the pelvis concerning for developing abscess following recent hysterectomy.   Due to incidentally found acute pulmonary embolism with concern for right heart strain, TRH, hospitalist service, was  consulted to assist with the medical management of her acute medical condition.     09/27/2021: Patient was seen and examined at her bedside this afternoon.  She reports after her surgery on 09/11/2021 she has been minimally active, mostly staying in bed.  No recent lengthy trips.  She continues to smoke 1 pack of cigarettes lasting about 3 days.  She denies having any chest pain or cough or hemoptysis.  She endorses dyspnea with ambulation.  She has tenderness with palpation of her lower extremities bilaterally.  She denies family history of blood clots.  Denies personal history of GI bleed or head trauma.  09/28/2021: Patient was seen and examined at bedside.  She states she feels better today.  She is tolerating a clear liquid diet and has had 2 bowel movements.  Diet advanced to heart healthy, modified.  Tolerating diet.  2D echo has been completed, awaiting results.  Bilateral lower extremity Doppler ultrasound negative for DVT.      Assessment/Plan: Principal Problem:   Pelvic infection in female  Incidentally found acute pulmonary embolism, involving right lower lobe segmental and subsegmental pulmonary arteries with concern for right heart strain, seen on CT abdomen pelvis with contrast done on 09/27/2021.   With flattening of the interventricular septum raising suspicion for degree of right heart strain.   Post hysterectomy with bilateral salpingectomy on 09/11/2021, Surgical pathology was benign, negative for malignancy. Sedentary lifestyle, obesity, ongoing tobacco use, postsurgery. Start heparin drip, continue for 24 to 48 hours, if right heart strain is present on 2D echo.   BNP 248 on 09/27/2021. High-sensitivity troponin 2, negative. Bilateral lower extremity Doppler ultrasound negative for DVT. 2D echo completed, awaiting results. Continue to maintain O2  saturation greater than 92% Consider Eliquis for 3 to 6 months, since no prior history of thromboembolism, when close to DC  if no planned procedures.   Complex multiloculated collection in the pelvis with concern for developing abscess, measuring up to 4.8 cm x 4.1 cm x 4.6 cm, POA CT scan findings concerning for developing abscess following recent hysterectomy. IR reviewed imaging, abscess not amenable for drainage at this time. Continue Zosyn for now. Pain control as needed   Ill-defined hypodense lesion in the left hepatic lobe measuring up to approximately 1.5 cm No other focal lesions seen Outpatient surveillance.   Right small kidney cyst/5 mm nonobstructing left renal stone No hydronephrosis or hydroureter. Outpatient surveillance.   Abdominal distention, suspect ileus, resolving No evidence of bowel obstruction seen on CT scan No abnormal bowel wall thickening or inflammatory changes. Continue to mobilize as tolerated Continue gentle IV fluid hydration LR 50 cc/h x 2 days. Continue to optimize magnesium and potassium levels. Goal magnesium greater than 2.0 Goal potassium greater than 4.0. Replete electrolytes as indicated. Simethicone 80 mg 4 times daily Dulcolax suppository as needed Will obtain labs in the morning.  Hypokalemia Serum potassium 3.4, goal potassium greater than 4.0. Repleted orally with 40 mEq KCl x2 doses.   Tobacco use disorder Tobacco cessation counseled on at bedside Patient is receptive to quitting tobacco use.   Essential hypertension BP is at goal, stable Continue home oral antihypertensives, Norvasc  Prediabetes Hemoglobin A1c 6.3 on 2122 Start metformin 500 mg twice daily  Obesity BMI 35 Recommend weight loss outpatient with regular physical activity and healthy dieting.         Code Status: Full code  Family Communication: None at bedside  Disposition Plan: Likely will discharge to home on 09-29-21 if 2D echo result is benign.   Consultants: Gynecology  Procedures: 2D echo  Antimicrobials: None  DVT prophylaxis: Heparin drip  Status  is: Inpatient  Inpatient status.  Patient requires at least 2 midnights for further evaluation and treatment of present condition.      Objective: Vitals:   09/28/21 0426 09/28/21 0829 09/28/21 1216 09/28/21 1704  BP: 128/83 129/79 130/89 139/87  Pulse: 80 86 85 90  Resp: 18 12 14 16   Temp: 98.5 F (36.9 C) 98.3 F (36.8 C) 98.6 F (37 C) 98.5 F (36.9 C)  TempSrc: Oral Oral Oral Oral  SpO2: 94% 95% 96% 97%  Weight:      Height:        Intake/Output Summary (Last 24 hours) at 09/28/2021 1724 Last data filed at 09/28/2021 1500 Gross per 24 hour  Intake 1233.31 ml  Output --  Net 1233.31 ml   Filed Weights   09/27/21 1700  Weight: 87.5 kg    Exam:  General: 45 y.o. year-old female well developed well nourished in no acute distress.  Alert and oriented x3. Cardiovascular: Regular rate and rhythm with no rubs or gallops.  No thyromegaly or JVD noted.   Respiratory: Clear to auscultation with no wheezes or rales. Good inspiratory effort. Abdomen: Soft nontender nondistended with normal bowel sounds x4 quadrants. Musculoskeletal: No lower extremity edema. 2/4 pulses in all 4 extremities. Skin: No ulcerative lesions noted or rashes, Psychiatry: Mood is appropriate for condition and setting   Data Reviewed: CBC: Recent Labs  Lab 09/24/21 1925 09/25/21 0518 09/26/21 0502 09/27/21 0521 09/28/21 0003  WBC 13.5* 10.4 9.9 9.1 11.8*  NEUTROABS 9.4* 7.2 6.8 6.2 9.2*  HGB 12.0 10.9* 11.0* 10.8* 11.5*  HCT  39.0 35.3* 35.4* 36.1 36.7  MCV 74.9* 74.5* 74.7* 76.3* 73.7*  PLT 512* 397 410* 384 740*   Basic Metabolic Panel: Recent Labs  Lab 09/24/21 1925 09/26/21 0502 09/27/21 0521 09/28/21 0003  NA 135 135 136 134*  K 5.5* 3.6 3.6 3.4*  CL 105 103 101 102  CO2 23 25 27 25   GLUCOSE 105* 109* 115* 113*  BUN 16 9 10 6   CREATININE 0.76 0.65 0.67 0.72  CALCIUM 8.6* 8.6* 8.5* 8.8*  MG  --   --   --  2.0  PHOS  --   --   --  2.8   GFR: Estimated Creatinine  Clearance: 91.3 mL/min (by C-G formula based on SCr of 0.72 mg/dL). Liver Function Tests: Recent Labs  Lab 09/24/21 1925 09/28/21 0003  AST 39 17  ALT 23 22  ALKPHOS 114 113  BILITOT 1.2 0.4  PROT 7.7 7.4  ALBUMIN 3.6 3.5   No results for input(s): LIPASE, AMYLASE in the last 168 hours. No results for input(s): AMMONIA in the last 168 hours. Coagulation Profile: Recent Labs  Lab 09/27/21 1851  INR 1.0   Cardiac Enzymes: No results for input(s): CKTOTAL, CKMB, CKMBINDEX, TROPONINI in the last 168 hours. BNP (last 3 results) No results for input(s): PROBNP in the last 8760 hours. HbA1C: Recent Labs    09/28/21 0003  HGBA1C 6.3*   CBG: Recent Labs  Lab 09/28/21 1130 09/28/21 1700  GLUCAP 124* 131*   Lipid Profile: No results for input(s): CHOL, HDL, LDLCALC, TRIG, CHOLHDL, LDLDIRECT in the last 72 hours. Thyroid Function Tests: No results for input(s): TSH, T4TOTAL, FREET4, T3FREE, THYROIDAB in the last 72 hours. Anemia Panel: No results for input(s): VITAMINB12, FOLATE, FERRITIN, TIBC, IRON, RETICCTPCT in the last 72 hours. Urine analysis:    Component Value Date/Time   COLORURINE YELLOW 09/24/2021 2030   APPEARANCEUR HAZY (A) 09/24/2021 2030   LABSPEC 1.013 09/24/2021 2030   PHURINE 7.0 09/24/2021 2030   GLUCOSEU NEGATIVE 09/24/2021 2030   HGBUR MODERATE (A) 09/24/2021 2030   [place] NEGATIVE 09/24/2021 2030   BILIRUBINUR negative 11/22/2020 1721   [place] 09/24/2021 2030   PROTEINUR NEGATIVE 09/24/2021 2030   UROBILINOGEN 0.2 11/22/2020 1721   UROBILINOGEN 1.0 07/07/2014 1421   NITRITE NEGATIVE 09/24/2021 2030   LEUKOCYTESUR LARGE (A) 09/24/2021 2030   Sepsis Labs: @LABRCNTIP (procalcitonin:4,lacticidven:4)  ) Recent Results (from the past 240 hour(s))  Resp Panel by RT-PCR (Flu A&B, Covid) Nasopharyngeal Swab     Status: None   Collection Time: 09/25/21 12:02 AM   Specimen: Nasopharyngeal Swab; Nasopharyngeal(NP) swabs in vial  transport medium  Result Value Ref Range Status   SARS Coronavirus 2 by RT PCR NEGATIVE NEGATIVE Final    Comment: (NOTE) SARS-CoV-2 target nucleic acids are NOT DETECTED.  The SARS-CoV-2 RNA is generally detectable in upper respiratory specimens during the acute phase of infection. The lowest concentration of SARS-CoV-2 viral copies this assay can detect is 138 copies/mL. A negative result does not preclude SARS-Cov-2 infection and should not be used as the sole basis for treatment or other patient management decisions. A negative result may occur with  improper specimen collection/handling, submission of specimen other than nasopharyngeal swab, presence of viral mutation(s) within the areas targeted by this assay, and inadequate number of viral copies(<138 copies/mL). A negative result must be combined with clinical observations, patient history, and epidemiological information. The expected result is Negative.  Fact Sheet for Patients:  EntrepreneurPulse.com.au  Fact Sheet for Healthcare Providers:  IncredibleEmployment.be  This test is no t yet approved or cleared by the [place] and  has been authorized for detection and/or diagnosis of SARS-CoV-2 by FDA under an Emergency Use Authorization (EUA). This EUA will remain  in effect (meaning this test can be used) for the duration of the COVID-19 declaration under Section 564(b)(1) of the Act, 21 U.S.C.section 360bbb-3(b)(1), unless the authorization is terminated  or revoked sooner.       Influenza A by PCR NEGATIVE NEGATIVE Final   Influenza B by PCR NEGATIVE NEGATIVE Final    Comment: (NOTE) The Xpert Xpress SARS-CoV-2/FLU/RSV plus assay is intended as an aid in the diagnosis of influenza from Nasopharyngeal swab specimens and should not be used as a sole basis for treatment. Nasal washings and aspirates are unacceptable for Xpert Xpress SARS-CoV-2/FLU/RSV testing.  Fact  Sheet for Patients: EntrepreneurPulse.com.au  Fact Sheet for Healthcare Providers: IncredibleEmployment.be  This test is not yet approved or cleared by the [place] FDA and has been authorized for detection and/or diagnosis of SARS-CoV-2 by FDA under an Emergency Use Authorization (EUA). This EUA will remain in effect (meaning this test can be used) for the duration of the COVID-19 declaration under Section 564(b)(1) of the Act, 21 U.S.C. section 360bbb-3(b)(1), unless the authorization is terminated or revoked.  Performed at [facility], [place] [address], [place], [place] [address]       Studies: VAS [place] LOWER EXTREMITY VENOUS (DVT)  Result Date: 09/28/2021  Lower Venous DVT Study Patient Name:  Brianna Gutierrez  Date of Exam:   09/28/2021 Medical Rec #: [id number]      Accession #:    [id number] Date of Birth: September 19, 1976       Patient Gender: F Patient Age:   76 years Exam Location:  [facility] Procedure:      VAS [place] LOWER EXTREMITY VENOUS (DVT) Referring Phys: Irene Pap --------------------------------------------------------------------------------  Indications: Pulmonary embolism.  Risk Factors: S/p partial hysterectomy x2 weeks. Comparison Study: No prior study Performing Technologist: Maudry Mayhew MHA, RDMS, RVT, RDCS  Examination Guidelines: A complete evaluation includes B-mode imaging, spectral Doppler, color Doppler, and power Doppler as needed of all accessible portions of each vessel. Bilateral testing is considered an integral part of a complete examination. Limited examinations for reoccurring indications may be performed as noted. The reflux portion of the exam is performed with the patient in reverse Trendelenburg.  +---------+---------------+---------+-----------+----------+--------------+ RIGHT    CompressibilityPhasicitySpontaneityPropertiesThrombus Aging  +---------+---------------+---------+-----------+----------+--------------+ CFV      Full           Yes      Yes                                 +---------+---------------+---------+-----------+----------+--------------+ SFJ      Full                                                        +---------+---------------+---------+-----------+----------+--------------+ FV Prox  Full                                                        +---------+---------------+---------+-----------+----------+--------------+  FV Mid   Full                                                        +---------+---------------+---------+-----------+----------+--------------+ FV DistalFull                                                        +---------+---------------+---------+-----------+----------+--------------+ PFV      Full                                                        +---------+---------------+---------+-----------+----------+--------------+ POP      Full           Yes      Yes                                 +---------+---------------+---------+-----------+----------+--------------+ PTV      Full                                                        +---------+---------------+---------+-----------+----------+--------------+ PERO     Full                                                        +---------+---------------+---------+-----------+----------+--------------+   +---------+---------------+---------+-----------+----------+--------------+ LEFT     CompressibilityPhasicitySpontaneityPropertiesThrombus Aging +---------+---------------+---------+-----------+----------+--------------+ CFV      Full           Yes      Yes                                 +---------+---------------+---------+-----------+----------+--------------+ SFJ      Full                                                         +---------+---------------+---------+-----------+----------+--------------+ FV Prox  Full                                                        +---------+---------------+---------+-----------+----------+--------------+ FV Mid   Full                                                        +---------+---------------+---------+-----------+----------+--------------+  FV DistalFull                                                        +---------+---------------+---------+-----------+----------+--------------+ PFV      Full                                                        +---------+---------------+---------+-----------+----------+--------------+ POP      Full           Yes      Yes                                 +---------+---------------+---------+-----------+----------+--------------+ PTV      Full                                                        +---------+---------------+---------+-----------+----------+--------------+ PERO     Full                                                        +---------+---------------+---------+-----------+----------+--------------+     Summary: BILATERAL: - No evidence of deep vein thrombosis seen in the lower extremities, bilaterally. -No evidence of popliteal cyst, bilaterally.   *See table(s) above for measurements and observations. Electronically signed by Monica Martinez MD on 09/28/2021 at 5:23:51 PM.    Final     Scheduled Meds:  amLODipine  10 mg Oral QHS   insulin aspart  0-5 Units Subcutaneous QHS   insulin aspart  0-9 Units Subcutaneous TID WC   loratadine  10 mg Oral QHS   [START ON 09/29/2021] metFORMIN  500 mg Oral BID WC   potassium chloride  40 mEq Oral BID   prenatal multivitamin  1 tablet Oral Q1200   senna-docusate  2 tablet Oral BID   sertraline  100 mg Oral QHS   simethicone  80 mg Oral Q6H    Continuous Infusions:  heparin 1,650 Units/hr (09/28/21 1524)   lactated ringers Stopped  (09/28/21 0601)   piperacillin-tazobactam (ZOSYN)  IV 3.375 g (09/28/21 1147)     LOS: 3 days     Kayleen Memos, MD [facility] Pager [phone]  If 7PM-7AM, please contact night-coverage [email or web address] Password [facility] 09/28/2021, 5:24 PM

## 2021-09-28 NOTE — Progress Notes (Signed)
ANTICOAGULATION CONSULT NOTE - follow up  Pharmacy Consult for Heparin Indication: pulmonary embolus  Allergies  Allergen Reactions   Tramadol Other (See Comments)    Headache    Patient Measurements: Height: 5\' 2"  (157.5 cm) Weight: 87.5 kg (192 lb 14.4 oz) IBW/kg (Calculated) : 50.1 Last weight 90.3 kg (11/17) Heparin Dosing Weight: 70 kg  Vital Signs: Temp: 98.3 F (36.8 C) (12/01 0829) Temp Source: Oral (12/01 0829) BP: 129/79 (12/01 0829) Pulse Rate: 86 (12/01 0829)  Labs: Recent Labs    09/26/21 0502 09/27/21 0521 09/27/21 1851 09/27/21 1951 09/28/21 0003 09/28/21 0717  HGB 11.0* 10.8*  --   --  11.5*  --   HCT 35.4* 36.1  --   --  36.7  --   PLT 410* 384  --   --  420*  --   APTT  --   --  45*  --   --   --   LABPROT  --   --  13.3  --   --   --   INR  --   --  1.0  --   --   --   HEPARINUNFRC  --   --   --   --  0.13* 0.47  CREATININE 0.65 0.67  --   --  0.72  --   TROPONINIHS  --   --   --  2  --   --      Estimated Creatinine Clearance: 91.3 mL/min (by C-G formula based on SCr of 0.72 mg/dL).   Medical History: Past Medical History:  Diagnosis Date   Abnormal Pap smear of cervix    Anxiety    Depression    Dysmenorrhea    Eczema    GERD (gastroesophageal reflux disease)    History of COVID-19 10/24/2020   loss of taste and smellbody aches and cough x 4 days all symptoms resolved   Hypertension    Pre-diabetes    Rash 08/31/2021   comes and goes on legs saw dermetology no rash now ? food allergy   SUI (stress urinary incontinence, female)    Trichomonas infection 02/2021   Uterine fibroid     Medications:  Infusions:   heparin 1,500 Units/hr (09/28/21 0847)   lactated ringers Stopped (09/28/21 0601)   piperacillin-tazobactam (ZOSYN)  IV 3.375 g (09/28/21 0553)    Assessment: 56 yoF admitted on 11/27 for worsening abdominal pain s/p hysterectomy on 11/14.  Found to have PE on 11/30 and pharmacy is consulted to dose Heparin. No  current or prior anticoagulation.  SCDs have been on since 11/28 Baseline aPTT 45, PT/INR WNL  09/28/2021 HL 0.47 now therapeutic on 1500 units/hr Hgb up to 11.5, plts 420 Per RN no interruptions or bleeding  Goal of Therapy:  Heparin level 0.3-0.7 units/ml Monitor platelets by anticoagulation protocol: Yes   Plan:  Continue heparin infusion at 1500 units/hr Repeat heparin level in 6 hours  Daily heparin level and CBC   Ulice Dash, PharmD  09/28/2021, 8:59 AM

## 2021-09-29 DIAGNOSIS — N739 Female pelvic inflammatory disease, unspecified: Secondary | ICD-10-CM | POA: Diagnosis not present

## 2021-09-29 LAB — BASIC METABOLIC PANEL
Anion gap: 5 (ref 5–15)
BUN: 7 mg/dL (ref 6–20)
CO2: 25 mmol/L (ref 22–32)
Calcium: 8.5 mg/dL — ABNORMAL LOW (ref 8.9–10.3)
Chloride: 103 mmol/L (ref 98–111)
Creatinine, Ser: 0.71 mg/dL (ref 0.44–1.00)
GFR, Estimated: >60 mL/min (ref >60)
Glucose, Bld: 110 mg/dL — ABNORMAL HIGH (ref 70–99)
Potassium: 3.6 mmol/L (ref 3.5–5.1)
Sodium: 133 mmol/L — ABNORMAL LOW (ref 135–145)

## 2021-09-29 LAB — GLUCOSE, CAPILLARY
Glucose-Capillary: 111 mg/dL — ABNORMAL HIGH (ref 70–99)
Glucose-Capillary: 147 mg/dL — ABNORMAL HIGH (ref 70–99)

## 2021-09-29 LAB — MAGNESIUM: Magnesium: 2 mg/dL (ref 1.7–2.4)

## 2021-09-29 MED ORDER — AMOXICILLIN-POT CLAVULANATE 875-125 MG PO TABS: 1.0000 | ORAL_TABLET | Freq: Two times a day (BID) | ORAL | Status: DC

## 2021-09-29 MED ORDER — AMOXICILLIN-POT CLAVULANATE 875-125 MG PO TABS
1.0000 | ORAL_TABLET | Freq: Two times a day (BID) | ORAL | Status: DC
  Administered 2021-09-29: 1 via ORAL
  Filled 2021-09-29: qty 1

## 2021-09-29 MED ORDER — APIXABAN 5 MG PO TABS
10.0000 mg | ORAL_TABLET | Freq: Two times a day (BID) | ORAL | Status: DC
  Administered 2021-09-29: 10 mg via ORAL
  Filled 2021-09-29: qty 2

## 2021-09-29 MED ORDER — NICOTINE 14 MG/24HR TD PT24: 14.0000 mg | MEDICATED_PATCH | TRANSDERMAL | 0 refills | Status: DC

## 2021-09-29 MED ORDER — APIXABAN 5 MG PO TABS: 5.0000 mg | ORAL_TABLET | Freq: Two times a day (BID) | ORAL | 0 refills | Status: DC

## 2021-09-29 MED ORDER — POLYETHYLENE GLYCOL 3350 17 G PO PACK: 17.0000 g | PACK | Freq: Every day | ORAL | 0 refills | Status: DC | PRN

## 2021-09-29 MED ORDER — FLUCONAZOLE 150 MG PO TABS
150.0000 mg | ORAL_TABLET | Freq: Once | ORAL | Status: AC
  Administered 2021-09-29: 150 mg via ORAL
  Filled 2021-09-29: qty 1

## 2021-09-29 MED ORDER — APIXABAN 5 MG PO TABS: 5.0000 mg | ORAL_TABLET | Freq: Two times a day (BID) | ORAL | Status: DC

## 2021-09-29 MED ORDER — METFORMIN HCL 500 MG PO TABS: 500.0000 mg | ORAL_TABLET | Freq: Two times a day (BID) | ORAL | 0 refills | Status: DC

## 2021-09-29 MED ORDER — OXYCODONE HCL 5 MG PO TABS: 5.0000 mg | ORAL_TABLET | Freq: Three times a day (TID) | ORAL | 0 refills | Status: AC | PRN

## 2021-09-29 MED ORDER — AMOXICILLIN-POT CLAVULANATE 875-125 MG PO TABS: 1.0000 | ORAL_TABLET | Freq: Two times a day (BID) | ORAL | 0 refills | Status: DC

## 2021-09-29 MED ORDER — SACCHAROMYCES BOULARDII 250 MG PO CAPS
250.0000 mg | ORAL_CAPSULE | Freq: Two times a day (BID) | ORAL | Status: DC
  Administered 2021-09-29: 250 mg via ORAL
  Filled 2021-09-29: qty 1

## 2021-09-29 MED ORDER — SACCHAROMYCES BOULARDII 250 MG PO CAPS
250.0000 mg | ORAL_CAPSULE | Freq: Two times a day (BID) | ORAL | 0 refills | Status: AC
Start: 2021-09-29 — End: 2021-10-13

## 2021-09-29 MED ORDER — PRENATAL MULTIVITAMIN CH: 1.0000 | ORAL_TABLET | Freq: Every day | ORAL | 0 refills | Status: DC

## 2021-09-29 MED ORDER — APIXABAN (ELIQUIS) VTE STARTER PACK (10MG AND 5MG): ORAL_TABLET | ORAL | 0 refills | Status: DC

## 2021-09-29 NOTE — Progress Notes (Signed)
Subjective: Patient pain has improved some, no longer having gas pain, lower abdominal pain up to a 6/10 in severity. Tolerating regular diet, passing gas, having BM's.    Objective: I have reviewed patient's vital signs and labs.  Today's Vitals   09/28/21 2320 09/29/21 0429 09/29/21 0439 09/29/21 0459  BP:   127/75   Pulse:   85   Resp:   18   Temp:   98.4 F (36.9 C)   TempSrc:   Oral   SpO2:   98%   Weight:      Height:      PainSc: 0-No pain 7   Asleep   Body mass index is 35.28 kg/m.  General: alert, cooperative, and no distress Resp: clear to auscultation bilaterally Cardio: S1, S2 normal GI: soft, non-tender; bowel sounds normal; no masses,  no organomegaly Extremities: bilateral calf tenderness, no edema.   Assessment/Plan: 1) POD #18 s/p TLH/LOA/TVT admitted on 11/27 with post op infection, suspected abscess. WBC and improved on IV antibiotics, but the patient developed a severe ileus and a PE. Her pain and exam have improved. Recommend d/c home on Augmentin to complete a 14 day course.  -f/u with Dr Quincy Simmonds next week  2) Ileus, resolved  3) Pulmonary embolism, stable, managed by hospitalist who plans d/c home today    LOS: 4 days    Brianna Gutierrez 09/29/2021, 7:40 AM

## 2021-09-29 NOTE — Discharge Instructions (Addendum)
Information on my medicine - ELIQUIS (apixaban)  This medication education was reviewed with me or my healthcare representative as part of my discharge preparation.  The pharmacist that spoke with me during my hospital stay was:  Napoleon [facility]  Why was Eliquis prescribed for you? Eliquis was prescribed to treat blood clots that may have been found in the veins of your legs (deep vein thrombosis) or in your lungs (pulmonary embolism) and to reduce the risk of them occurring again.  What do You need to know about Eliquis ? The starting dose is 10 mg (two 5 mg tablets) taken TWICE daily for the FIRST SEVEN (7) DAYS, then on 10/06/21  the dose is reduced to ONE 5 mg tablet taken TWICE daily.  Eliquis may be taken with or without food.   Try to take the dose about the same time in the morning and in the evening. If you have difficulty swallowing the tablet whole please discuss with your pharmacist how to take the medication safely.  Take Eliquis exactly as prescribed and DO NOT stop taking Eliquis without talking to the doctor who prescribed the medication.  Stopping may increase your risk of developing a new blood clot.  Refill your prescription before you run out.  After discharge, you should have regular check-up appointments with your healthcare provider that is prescribing your Eliquis.    What do you do if you miss a dose? If a dose of ELIQUIS is not taken at the scheduled time, take it as soon as possible on the same day and twice-daily administration should be resumed. The dose should not be doubled to make up for a missed dose.  Important Safety Information A possible side effect of Eliquis is bleeding. You should call your healthcare provider right away if you experience any of the following: Bleeding from an injury or your nose that does not stop. Unusual colored urine (red or dark brown) or unusual colored stools (red or black). Unusual bruising for unknown reasons. A  serious fall or if you hit your head (even if there is no bleeding).  Some medicines may interact with Eliquis and might increase your risk of bleeding or clotting while on Eliquis. To help avoid this, consult your healthcare provider or pharmacist prior to using any new prescription or non-prescription medications, including herbals, vitamins, non-steroidal anti-inflammatory drugs (NSAIDs) and supplements.  This website has more information on Eliquis (apixaban): http://www.eliquis.com/eliquis/home

## 2021-09-29 NOTE — Progress Notes (Signed)
ANTICOAGULATION CONSULT NOTE - follow up  Pharmacy Consult for Heparin>apixaban Indication: pulmonary embolus  Allergies  Allergen Reactions   Tramadol Other (See Comments)    Headache    Patient Measurements: Height: 5\' 2"  (157.5 cm) Weight: 87.5 kg (192 lb 14.4 oz) IBW/kg (Calculated) : 50.1 Last weight 90.3 kg (11/17) Heparin Dosing Weight: 70 kg  Vital Signs: Temp: 98.4 F (36.9 C) (12/02 0439) Temp Source: Oral (12/02 0439) BP: 127/75 (12/02 0439) Pulse Rate: 85 (12/02 0439)  Labs: Recent Labs    09/27/21 0521 09/27/21 1851 09/27/21 1951 09/28/21 0003 09/28/21 0003 09/28/21 0717 09/28/21 1314 09/28/21 2106  HGB 10.8*  --   --  11.5*  --   --   --   --   HCT 36.1  --   --  36.7  --   --   --   --   PLT 384  --   --  420*  --   --   --   --   APTT  --  45*  --   --   --   --   --   --   LABPROT  --  13.3  --   --   --   --   --   --   INR  --  1.0  --   --   --   --   --   --   HEPARINUNFRC  --   --   --  0.13*   < > 0.47 0.24* 0.44  CREATININE 0.67  --   --  0.72  --   --   --   --   TROPONINIHS  --   --  2  --   --   --   --   --    < > = values in this interval not displayed.     Estimated Creatinine Clearance: 91.3 mL/min (by C-G formula based on SCr of 0.72 mg/dL).   Medical History: Past Medical History:  Diagnosis Date   Abnormal Pap smear of cervix    Anxiety    Depression    Dysmenorrhea    Eczema    GERD (gastroesophageal reflux disease)    History of COVID-19 10/24/2020   loss of taste and smellbody aches and cough x 4 days all symptoms resolved   Hypertension    Pre-diabetes    Rash 08/31/2021   comes and goes on legs saw dermetology no rash now ? food allergy   SUI (stress urinary incontinence, female)    Trichomonas infection 02/2021   Uterine fibroid     Medications:  Infusions:   piperacillin-tazobactam (ZOSYN)  IV 3.375 g (09/29/21 0348)    Assessment: 80 yoF admitted on 11/27 for worsening abdominal pain s/p  hysterectomy on 11/14.  Found to have PE on 11/30 and pharmacy is consulted to dose Heparin. No current or prior anticoagulation.  SCDs have been on since 11/28 Baseline aPTT 45, PT/INR WNL  09/29/2021 2106 HL 0.44 therapeutic on 1650 units/hr No bleeding or infusion issues noted  Goal of Therapy:  Heparin level 0.3-0.7 units/ml Monitor platelets by anticoagulation protocol: Yes   Plan:  Continue heparin drip at 1650 units/hr Repeat heparin level in 6 hours  Daily heparin level and CBC   Dolly Rias RPh 09/29/2021, 5:12 AM  Pharmacy consulted to stop heparin drip and start apixaban  Plan: Apixaban 10mg  po twice daily x7 days then Apixaban 5mg  twice daily thereafter Stop heparin drip  and labs Pharmacy to provide education  Dolly Rias RPh 09/29/2021, 5:13 AM

## 2021-09-29 NOTE — TOC Benefit Eligibility Note (Signed)
Transition of Care [facility]) Benefit Eligibility Note    Patient Details  Name: Brianna Gutierrez MRN: [id number] Date of Birth: February 10, 1976   Medication/Dose: 10 mg po x 10 days then 73m poqd  Covered?: Yes  Tier: Other  Prescription Coverage Preferred Pharmacy: local  Spoke with Person/Company/Phone Number:: Carmen/ [employer] [phone] Co-Pay: 0  Prior Approval: No  Deductible: Met       FKerin SalenPhone Number: 09/29/2021, 2:32 PM

## 2021-09-29 NOTE — TOC Transition Note (Signed)
Transition of Care [facility]) - CM/SW Discharge Note   Patient Details  Name: Tiziana Cislo MRN: [id number] Date of Birth: 06/30/1976  [facility] of [facility]) CM/SW Contact:  Dessa Phi, RN Phone Number: 09/29/2021, 12:52 PM   Clinical Narrative: Noted benefit check for eliquis-Benefit check liason will put a note in once benefit check completed. No further CM needs.      Final next level of care: Home/Self Care     Patient Goals and CMS Choice Patient states their goals for this hospitalization and ongoing recovery are:: go home CMS Medicare.gov Compare Post Acute Care list provided to:: Patient Choice offered to / list presented to : Patient  Discharge Placement                       Discharge Plan and Services   Discharge Planning Services: CM Consult                                 Social Determinants of Health (SDOH) Interventions     Readmission Risk Interventions No flowsheet data found.

## 2021-09-29 NOTE — Discharge Summary (Signed)
Discharge Summary  Brianna Gutierrez VZD:[id number] DOB: [birth year]  PCP: [name], NP  Admit date: 09/24/2021 Discharge date: 09/29/2021  Time spent: 35 minutes   Recommendations for Outpatient Follow-up:  Follow up with gynecology within a week. Follow up with your PCP in 1 to 2 weeks. Take your medications as prescribed.  Discharge Diagnoses:  Active Hospital Problems   Diagnosis Date Noted   Pelvic infection in female 09/24/2021    Resolved Hospital Problems  No resolved problems to display.    Discharge Condition:  Stable   Diet recommendation: Heart healthy carb modified diet.  Vitals:   09/28/21 2100 09/29/21 0439  BP: 132/79 127/75  Pulse: 88 85  Resp: 18 18  Temp: 98.9 F (37.2 C) 98.4 F (36.9 C)  SpO2: 96% 98%    History of present illness:  Brianna Gutierrez is an 45 y.o. female with past medical history significant for essential hypertension, prediabetes, obesity with BMI of 35, ongoing tobacco use, chronic anxiety/depression, post hysterectomy and bilateral salpingectomy on 09/11/2021 by gynecology, Dr. Quincy Simmonds, who presented to [facility] ED on 09/24/2021 due to severe diffuse abdominal pain worse in lower abdominal quadrants x2 days.  Associated with nausea with no vomiting.  No reported fevers, urinary symptoms, vaginal bleeding, or discharge.  She was seen by her gynecologist office on 09/18/21, started on Bactrim for presumptive UTI, the antibiotic was stopped after urine culture returned negative.     Upon presentation to the ED on 09/24/2021, CT abdomen pelvis with contrast revealed possible developing abscess in the surgical bed and left adnexa.  Patient was admitted by the gynecology team and was started on IV antibiotics, initially on Zosyn then was switched to Augmentin and p.o. Flagyl on 09/26/2021.   Hospital course was complicated by intractable nausea and abdominal distention.  There was clinical suspicion for mild/moderate ileus which resolved as she was  able to have a bowel movement on 09/26/21.  Positive flatulence on 09/27/21 but vomited after drinking her oral contrast for her CT scan.     She had a repeat CT abdomen and pelvis with contrast done on 09/27/2021 which revealed incidentally found acute pulmonary emboli in the right lower lobe segmental and subsegmental arteries with flattening of the interventricular septum raising suspicion for a degree of right heart strain.  Also revealed complex multiloculated collection in the pelvis concerning for developing abscess following recent hysterectomy.   Due to incidentally found acute pulmonary embolism with concern for right heart strain, [facility], hospitalist service, was consulted to assist with the medical management of her acute medical conditions.    CT scans finding regarding her small irregular pelvic fluid collections was evaluated by interventional radiology, Dr. Anselm Pancoast.  No indication for percutaneous drainage at this time.  She was restarted on IV Zosyn on 10/27/2021, stopped on 09/29/2021 and switched to Augmentin for DC planning.    Heparin drip stopped on 09/29/21 and switched to Eliquis 10 mg twice daily x7 days then will continue 5 mg twice daily x3 to 6 months.  Bilateral Doppler ultrasound negative for DVT.  2D echo normal LVEF with no intra cardiac thrombus.  Metformin 500 mg twice daily was started for hyperglycemia in the setting of pre-diabetes with hemoglobin A1c of 6.3 on 09/28/2021.   09/29/2021: Patient was seen at bedside.  There were no acute events overnight.  Possible antibiotics induced yeast infection, 1 dose of fluconazole given.      Hospital Course:  Principal Problem:   Pelvic infection in female  Incidentally found acute pulmonary embolism, involving right lower lobe segmental and subsegmental pulmonary arteries with concern for right heart strain, seen on CT abdomen pelvis with contrast done on 09/27/2021.   With flattening of the interventricular septum raising  suspicion for degree of right heart strain.  BNP 248 on 09/27/2021.  High sensitivity troponin negative. Post hysterectomy with bilateral salpingectomy on 09/11/2021, Surgical pathology was benign, negative for malignancy. Sedentary lifestyle, obesity, ongoing tobacco use, postsurgery. Received heparin drip from 09/27/2021 until 09/29/21. No evidence of right heart strain on 2D echo, normal LVEF 60 to 65%    Bilateral lower extremity Doppler ultrasound negative for DVT. Oxygen saturation 98% on room air. Continue Eliquis for 3 to 6 months. Follow-up with your PCP.   Complex multiloculated collection in the pelvis with concern for developing abscess, measuring up to 4.8 cm x 4.1 cm x 4.6 cm, POA CT scan findings concerning for developing abscess following recent hysterectomy. IR reviewed imaging, abscess not amenable for drainage at this time. Received Zosyn from 09/27/2021 until 09/29/21. Switched to Augmentin twice daily x10 days. Florastor 250 mg twice daily x14 days.  POD #18 status post TLH/LOA/TVT Admitted on 09/24/2021 with postop infections, suspected abscess. DC home on Augmentin to complete 14-day course. Follow-up with Dr. Quincy Simmonds next week.   Ill-defined hypodense lesion in the left hepatic lobe measuring up to approximately 1.5 cm No other focal lesions seen Outpatient surveillance. Follow-up with your PCP   Right small kidney cyst/5 mm nonobstructing left renal stone No hydronephrosis or hydroureter. Outpatient surveillance. Follow-up with your PCP.   Resolved ileus No evidence of bowel obstruction seen on CT scan No abnormal bowel wall thickening or inflammatory changes. Received IV fluid hydration. Mobilize as tolerated Tolerating a regular consistency diet, having bowel movements.   Resolved post repletion: Hypokalemia Serum potassium 3.6 on 09/29/2021. Serum magnesium 2.0 on 09/29/2021.   Tobacco use disorder Tobacco cessation counseled on at bedside Patient is  receptive to quitting tobacco use. Nicotine patch   Essential hypertension BP is at goal, stable Continue home oral antihypertensives, Norvasc   Prediabetes Hemoglobin A1c 6.3 on 2122 Continue metformin 500 mg twice daily Follow-up with your PCP.   Obesity BMI 35 Recommend weight loss outpatient with regular physical activity and healthy dieting.         Code Status: Full code     Consultants: Gynecology   Procedures: 2D echo   Antimicrobials: Zosyn Augmentin     Discharge Exam: BP 127/75 (BP Location: Left Arm)   Pulse 85   Temp 98.4 F (36.9 C) (Oral)   Resp 18   Ht 5\' 2"  (1.575 m)   Wt 87.5 kg   LMP 08/18/2021 (Exact Date)   SpO2 98%   BMI 35.28 kg/m  General: 45 y.o. year-old female well developed well nourished in no acute distress.  Alert and oriented x3. Cardiovascular: Regular rate and rhythm with no rubs or gallops.  No thyromegaly or JVD noted.   Respiratory: Clear to auscultation with no wheezes or rales. Good inspiratory effort. Abdomen: Soft nontender nondistended with normal bowel sounds x4 quadrants. Musculoskeletal: No lower extremity edema. 2/4 pulses in all 4 extremities. Skin: No ulcerative lesions noted or rashes, Psychiatry: Mood is appropriate for condition and setting  Discharge Instructions You were cared for by a hospitalist during your hospital stay. If you have any questions about your discharge medications or the care you received while you were in the hospital after you are discharged, you can call the  unit and asked to speak with the hospitalist on call if the hospitalist that took care of you is not available. Once you are discharged, your primary care physician will handle any further medical issues. Please note that NO REFILLS for any discharge medications will be authorized once you are discharged, as it is imperative that you return to your primary care physician (or establish a relationship with a primary care physician if  you do not have one) for your aftercare needs so that they can reassess your need for medications and monitor your lab values.   Allergies as of 09/29/2021       Reactions   Tramadol Other (See Comments)   Headache        Medication List     STOP taking these medications    cetirizine 10 MG tablet Commonly known as: ZYRTEC   ibuprofen 800 MG tablet Commonly known as: ADVIL   oxyCODONE-acetaminophen 5-325 MG tablet Commonly known as: Percocet   sulfamethoxazole-trimethoprim 800-160 MG tablet Commonly known as: Bactrim DS       TAKE these medications    amLODipine 10 MG tablet Commonly known as: NORVASC Take 1 tablet (10 mg total) by mouth daily. What changed: when to take this   amoxicillin-clavulanate 875-125 MG tablet Commonly known as: AUGMENTIN Take 1 tablet by mouth every 12 (twelve) hours for 10 days.   Apixaban Starter Pack (10mg  and 5mg ) Commonly known as: ELIQUIS STARTER PACK Take as directed on package: start with two-5mg  tablets twice daily for 7 days. On day 8, switch to one-5mg  tablet twice daily.   betamethasone dipropionate 0.05 % cream Apply 1 application topically daily as needed (itching/ allergic reaction).   GAS-X MAXIMUM STRENGTH PO Take 1 tablet by mouth at bedtime as needed (gas/ bloating).   levocetirizine 5 MG tablet Commonly known as: XYZAL Take 5 mg by mouth at bedtime.   metFORMIN 500 MG tablet Commonly known as: GLUCOPHAGE Take 1 tablet (500 mg total) by mouth 2 (two) times daily with a meal.   nicotine 14 mg/24hr patch Commonly known as: NICODERM CQ - dosed in mg/24 hours Place 1 patch (14 mg total) onto the skin daily.   oxyCODONE 5 MG immediate release tablet Commonly known as: Oxy IR/ROXICODONE Take 1 tablet (5 mg total) by mouth every 8 (eight) hours as needed for up to 5 days for breakthrough pain or moderate pain.   polyethylene glycol 17 g packet Commonly known as: MiraLax Take 17 g by mouth daily as needed.    prenatal multivitamin Tabs tablet Take 1 tablet by mouth daily at 12 noon. Start taking on: September 30, 2021   saccharomyces boulardii 250 MG capsule Commonly known as: FLORASTOR Take 1 capsule (250 mg total) by mouth 2 (two) times daily for 14 days.   sertraline 100 MG tablet Commonly known as: Zoloft Take 1 tablet (100 mg total) by mouth daily. What changed: when to take this       Allergies  Allergen Reactions   Tramadol Other (See Comments)    Headache      The results of significant diagnostics from this hospitalization (including imaging, microbiology, ancillary and laboratory) are listed below for reference.    Significant Diagnostic Studies: CT ABDOMEN PELVIS W CONTRAST  Result Date: 09/27/2021 CLINICAL DATA:  Postop day fifth from hysterectomy with bilateral salpingectomy, elevated white count and pain, possible abscess seen on CT abdomen/pelvis 09/24/2021 EXAM: CT ABDOMEN AND PELVIS WITH CONTRAST TECHNIQUE: Multidetector CT imaging of the abdomen  and pelvis was performed using the standard protocol following bolus administration of intravenous contrast. CONTRAST:  44mL OMNIPAQUE IOHEXOL 350 MG/ML SOLN COMPARISON:  CT abdomen/pelvis 09/24/2021 FINDINGS: Lower chest: Acute pulmonary emboli are seen in right lower lobe segmental and subsegmental arteries. Linear opacities in the lung bases likely reflect atelectasis. There is flattening of the interventricular septum. Hepatobiliary: There is an ill-defined hypodense lesion in the left hepatic lobe measuring up to approximately 1.5 cm. No other focal lesions are seen. The gallbladder is surgically absent. There is no biliary ductal dilatation. Pancreas: Unremarkable. Spleen: Unremarkable. Adrenals/Urinary Tract: The adrenals are unremarkable. The right kidney is smaller than the left. The right kidney is slightly hypoenhancing compared to the left on the early phase images, but there is excretion of contrast into the collecting  systems bilaterally on the delayed phase images. A hypodense lesion in the right upper pole most likely reflects a small cyst. There is a 5 mm nonobstructing left renal stone, unchanged. There are no other focal lesions or stones. There is no hydronephrosis or hydroureter. The bladder is decompressed and not well assessed. Stomach/Bowel: The stomach is unremarkable. There is no evidence of bowel obstruction. There is no abnormal bowel wall thickening or inflammatory change. Appendix is normal. Vascular/Lymphatic: The abdominal aorta is normal in course and caliber. The major branch vessels are patent. The main portal and splenic veins are patent. There is no abdominal or pelvic lymphadenopathy. Reproductive: The patient is status post hysterectomy and bilateral salpingectomy. The right ovary is identified and appears normal. There is a complex, multiloculated fluid collection in the surgical bed measuring up to approximally 4.8 cm AP by 4.1 cm TV by 4.6 cm cc. A rounded hypodensity adjacent to the superolateral aspect of this collection measuring approximally 2.2 cm may reflect the left ovary surrounded by the collection (2-73). A separate loculated portion of the collection measuring 2.4 cm x 1.7 cm by 2.0 cm is noted more posteriorly and inferiorly in the pelvis with incomplete peripheral enhancement (6-75). This collection abuts the inferior margin of the sigmoid colon which may be adhered (6-59). Other: There is trace free fluid in the left hemipelvis adjacent to the above-described collection. There is no free intraperitoneal air. Musculoskeletal: There is no acute osseous abnormality or aggressive osseous lesion. IMPRESSION: 1. Acute pulmonary emboli in right lower lobe segmental and subsegmental arteries with flattening of the interventricular septum raising suspicion for a degree of right heart strain. 2. Complex multiloculated collection in the pelvis is concerning for developing abscess following recent  hysterectomy. An ovoid hypodensity in the left hemipelvis adjacent to the collection may reflect the left ovary surrounded by abscess. 3. Unchanged indeterminate lesion in the left hepatic lobe. Nonemergent liver MRI with and without contrast is recommended for characterization. 4. Nonobstructing left renal stone. These results were called by telephone at the time of interpretation on 09/27/2021 at 3:45 pm to provider Dr Sumner Boast, who verbally acknowledged these results. Electronically Signed   By: Valetta Mole M.D.   On: 09/27/2021 15:46   CT Abdomen Pelvis W Contrast  Result Date: 09/24/2021 CLINICAL DATA:  Abdominal pain, fever, postop. Hysterectomy 1 week ago. EXAM: CT ABDOMEN AND PELVIS WITH CONTRAST TECHNIQUE: Multidetector CT imaging of the abdomen and pelvis was performed using the standard protocol following bolus administration of intravenous contrast. CONTRAST:  82mL OMNIPAQUE IOHEXOL 350 MG/ML SOLN COMPARISON:  None. FINDINGS: Lower chest: Strandy opacities are noted at the lung bases, possible atelectasis or infiltrate. Hepatobiliary: There is an  ill-defined hypodensity in the left lobe of the liver hepatic segment 2 measuring 1.8 cm with indeterminate imaging characteristics. No biliary ductal dilatation. The gallbladder is surgically absent Pancreas: Unremarkable. No pancreatic ductal dilatation or surrounding inflammatory changes. Spleen: Normal in size without focal abnormality. Adrenals/Urinary Tract: No adrenal nodule or mass. Right renal atrophy and cortical scarring are noted. There is a delayed nephrogram on the right. A nonobstructive 7 mm calculus is present in the left kidney. No hydronephrosis is seen bilaterally. An extrarenal pelvis is noted on the right. The bladder is within normal limits. Stomach/Bowel: No bowel obstruction, free air or pneumatosis. A normal appendix is seen in the right lower quadrant the stomach is unremarkable. No focal bowel wall thickening.  Vascular/Lymphatic: No significant vascular findings are present. No enlarged abdominal or pelvic lymph nodes. Reproductive: The uterus is surgically absent. There are slightly complex fluid collections in the surgical bed and left adnexa measuring 2.6 x 1.2 cm and 5.3 by 6.2. Small cystic structures are noted in the left ovary, likely representing follicles. Other: A fat containing anterior abdominal wall hernia is noted to the right of midline superior to the umbilicus. Musculoskeletal: No acute osseous abnormality. IMPRESSION: 1. Status post hysterectomy with slightly complex free fluid in the surgical bed and left adnexa, possible residual blood products versus developing abscess. 2. Nonobstructive left renal calculus. 3. Right renal atrophy with cortical scarring. 4. Indeterminate hypodensity in the left lobe of the liver. Multiphase CT or MRI suggested for further characterization on follow-up. 5. Strandy opacities at the lung bases, possible atelectasis or infiltrate. Electronically Signed   By: Brett Fairy M.D.   On: 09/24/2021 21:33   ECHOCARDIOGRAM COMPLETE  Result Date: 09/28/2021    ECHOCARDIOGRAM REPORT   Patient Name:   Brianna Gutierrez Date of Exam: 09/28/2021 Medical Rec #:  [id number]     Height:       62.0 in Accession #:    [id number]    Weight:       192.9 lb Date of Birth:  [birth year]      BSA:          1.883 m Patient Age:    3 years      BP:           139/87 mmHg Patient Gender: F             HR:           86 bpm. Exam Location:  Inpatient Procedure: 2D Echo, Cardiac Doppler and Color Doppler Indications:    I26.02 Pulmonary embolus  History:        Patient has no prior history of Echocardiogram examinations.  Sonographer:    Merrie Roof RDCS Referring Phys: [id number]  1. Left ventricular ejection fraction, by estimation, is 60 to 65%. The left ventricle has normal function. The left ventricle has no regional wall motion abnormalities. Left ventricular diastolic  parameters were normal.  2. Right ventricular systolic function is normal. The right ventricular size is normal.  3. The mitral valve is normal in structure. No evidence of mitral valve regurgitation. No evidence of mitral stenosis.  4. The aortic valve is tricuspid. Aortic valve regurgitation is not visualized. No aortic stenosis is present. Comparison(s): No prior Echocardiogram. FINDINGS  Left Ventricle: Left ventricular ejection fraction, by estimation, is 60 to 65%. The left ventricle has normal function. The left ventricle has no regional wall motion abnormalities. The left ventricular internal cavity size was normal in  size. There is  no left ventricular hypertrophy. Left ventricular diastolic parameters were normal. Right Ventricle: The right ventricular size is normal. Right ventricular systolic function is normal. Left Atrium: Left atrial size was normal in size. Right Atrium: Right atrial size was normal in size. Pericardium: There is no evidence of pericardial effusion. Mitral Valve: The mitral valve is normal in structure. No evidence of mitral valve regurgitation. No evidence of mitral valve stenosis. Tricuspid Valve: The tricuspid valve is normal in structure. Tricuspid valve regurgitation is trivial. No evidence of tricuspid stenosis. Aortic Valve: The aortic valve is tricuspid. Aortic valve regurgitation is not visualized. No aortic stenosis is present. Aortic valve mean gradient measures 8.0 mmHg. Aortic valve peak gradient measures 15.5 mmHg. Aortic valve area, by VTI measures 2.25  cm. Pulmonic Valve: The pulmonic valve was normal in structure. Pulmonic valve regurgitation is not visualized. No evidence of pulmonic stenosis. Aorta: The aortic root is normal in size and structure. Venous: The inferior vena cava was not well visualized. IAS/Shunts: The interatrial septum was not well visualized.  LEFT VENTRICLE PLAX 2D LVIDd:         4.60 cm   Diastology LVIDs:         3.00 cm   LV e' medial:     8.92 cm/s LV PW:         1.00 cm   LV E/e' medial:  10.5 LV IVS:        1.00 cm   LV e' lateral:   11.50 cm/s LVOT diam:     2.00 cm   LV E/e' lateral: 8.1 LV SV:         77 LV SV Index:   41 LVOT Area:     3.14 cm  RIGHT VENTRICLE RV Basal diam:  3.10 cm LEFT ATRIUM             Index        RIGHT ATRIUM           Index LA diam:        3.00 cm 1.59 cm/m   RA Area:     15.30 cm LA Vol (A2C):   40.8 ml 21.67 ml/m  RA Volume:   43.60 ml  23.16 ml/m LA Vol (A4C):   39.5 ml 20.98 ml/m LA Biplane Vol: 43.6 ml 23.16 ml/m  AORTIC VALVE AV Area (Vmax):    2.04 cm AV Area (Vmean):   2.04 cm AV Area (VTI):     2.25 cm AV Vmax:           197.00 cm/s AV Vmean:          132.000 cm/s AV VTI:            0.343 m AV Peak Grad:      15.5 mmHg AV Mean Grad:      8.0 mmHg LVOT Vmax:         128.00 cm/s LVOT Vmean:        85.700 cm/s LVOT VTI:          0.246 m LVOT/AV VTI ratio: 0.72  AORTA Ao Root diam: 3.00 cm MITRAL VALVE MV Area (PHT): 3.12 cm    SHUNTS MV Decel Time: 243 msec    Systemic VTI:  0.25 m MV E velocity: 93.40 cm/s  Systemic Diam: 2.00 cm MV A velocity: 93.40 cm/s MV E/A ratio:  1.00 Kirk Ruths MD Electronically signed by Kirk Ruths MD Signature Date/Time: 09/28/2021/6:27:26 PM    Final  VAS [place] LOWER EXTREMITY VENOUS (DVT)  Result Date: 09/28/2021  Lower Venous DVT Study Patient Name:  ADINE HEIMANN  Date of Exam:   09/28/2021 Medical Rec #: [id number]      Accession #:    [id number] Date of Birth: 10/26/1976       Patient Gender: F Patient Age:   22 years Exam Location:  [facility] Procedure:      VAS [place] LOWER EXTREMITY VENOUS (DVT) Referring Phys: Irene Pap --------------------------------------------------------------------------------  Indications: Pulmonary embolism.  Risk Factors: S/p partial hysterectomy x2 weeks. Comparison Study: No prior study Performing Technologist: Maudry Mayhew MHA, RDMS, RVT, RDCS  Examination Guidelines: A complete evaluation includes B-mode imaging,  spectral Doppler, color Doppler, and power Doppler as needed of all accessible portions of each vessel. Bilateral testing is considered an integral part of a complete examination. Limited examinations for reoccurring indications may be performed as noted. The reflux portion of the exam is performed with the patient in reverse Trendelenburg.  +---------+---------------+---------+-----------+----------+--------------+ RIGHT    CompressibilityPhasicitySpontaneityPropertiesThrombus Aging +---------+---------------+---------+-----------+----------+--------------+ CFV      Full           Yes      Yes                                 +---------+---------------+---------+-----------+----------+--------------+ SFJ      Full                                                        +---------+---------------+---------+-----------+----------+--------------+ FV Prox  Full                                                        +---------+---------------+---------+-----------+----------+--------------+ FV Mid   Full                                                        +---------+---------------+---------+-----------+----------+--------------+ FV DistalFull                                                        +---------+---------------+---------+-----------+----------+--------------+ PFV      Full                                                        +---------+---------------+---------+-----------+----------+--------------+ POP      Full           Yes      Yes                                 +---------+---------------+---------+-----------+----------+--------------+ PTV  Full                                                        +---------+---------------+---------+-----------+----------+--------------+ PERO     Full                                                        +---------+---------------+---------+-----------+----------+--------------+    +---------+---------------+---------+-----------+----------+--------------+ LEFT     CompressibilityPhasicitySpontaneityPropertiesThrombus Aging +---------+---------------+---------+-----------+----------+--------------+ CFV      Full           Yes      Yes                                 +---------+---------------+---------+-----------+----------+--------------+ SFJ      Full                                                        +---------+---------------+---------+-----------+----------+--------------+ FV Prox  Full                                                        +---------+---------------+---------+-----------+----------+--------------+ FV Mid   Full                                                        +---------+---------------+---------+-----------+----------+--------------+ FV DistalFull                                                        +---------+---------------+---------+-----------+----------+--------------+ PFV      Full                                                        +---------+---------------+---------+-----------+----------+--------------+ POP      Full           Yes      Yes                                 +---------+---------------+---------+-----------+----------+--------------+ PTV      Full                                                        +---------+---------------+---------+-----------+----------+--------------+  PERO     Full                                                        +---------+---------------+---------+-----------+----------+--------------+     Summary: BILATERAL: - No evidence of deep vein thrombosis seen in the lower extremities, bilaterally. -No evidence of popliteal cyst, bilaterally.   *See table(s) above for measurements and observations. Electronically signed by Monica Martinez MD on 09/28/2021 at 5:23:51 PM.    Final     Microbiology: Recent Results (from the past 240  hour(s))  Resp Panel by RT-PCR (Flu A&B, Covid) Nasopharyngeal Swab     Status: None   Collection Time: 09/25/21 12:02 AM   Specimen: Nasopharyngeal Swab; Nasopharyngeal(NP) swabs in vial transport medium  Result Value Ref Range Status   SARS Coronavirus 2 by RT PCR NEGATIVE NEGATIVE Final    Comment: (NOTE) SARS-CoV-2 target nucleic acids are NOT DETECTED.  The SARS-CoV-2 RNA is generally detectable in upper respiratory specimens during the acute phase of infection. The lowest concentration of SARS-CoV-2 viral copies this assay can detect is 138 copies/mL. A negative result does not preclude SARS-Cov-2 infection and should not be used as the sole basis for treatment or other patient management decisions. A negative result may occur with  improper specimen collection/handling, submission of specimen other than nasopharyngeal swab, presence of viral mutation(s) within the areas targeted by this assay, and inadequate number of viral copies(<138 copies/mL). A negative result must be combined with clinical observations, patient history, and epidemiological information. The expected result is Negative.  Fact Sheet for Patients:  EntrepreneurPulse.com.au  Fact Sheet for Healthcare Providers:  IncredibleEmployment.be  This test is no t yet approved or cleared by the [place] FDA and  has been authorized for detection and/or diagnosis of SARS-CoV-2 by FDA under an Emergency Use Authorization (EUA). This EUA will remain  in effect (meaning this test can be used) for the duration of the COVID-19 declaration under Section 564(b)(1) of the Act, 21 U.S.C.section 360bbb-3(b)(1), unless the authorization is terminated  or revoked sooner.       Influenza A by PCR NEGATIVE NEGATIVE Final   Influenza B by PCR NEGATIVE NEGATIVE Final    Comment: (NOTE) The Xpert Xpress SARS-CoV-2/FLU/RSV plus assay is intended as an aid in the diagnosis of influenza from  Nasopharyngeal swab specimens and should not be used as a sole basis for treatment. Nasal washings and aspirates are unacceptable for Xpert Xpress SARS-CoV-2/FLU/RSV testing.  Fact Sheet for Patients: EntrepreneurPulse.com.au  Fact Sheet for Healthcare Providers: IncredibleEmployment.be  This test is not yet approved or cleared by the [place] FDA and has been authorized for detection and/or diagnosis of SARS-CoV-2 by FDA under an Emergency Use Authorization (EUA). This EUA will remain in effect (meaning this test can be used) for the duration of the COVID-19 declaration under Section 564(b)(1) of the Act, 21 U.S.C. section 360bbb-3(b)(1), unless the authorization is terminated or revoked.  Performed at [facility], [place] [address] [place], [place] [address]      Labs: Basic Metabolic Panel: Recent Labs  Lab 09/24/21 1925 09/26/21 0502 09/27/21 0521 09/28/21 0003 09/29/21 0516  NA 135 135 136 134* 133*  K 5.5* 3.6 3.6 3.4* 3.6  CL 105 103 101 102 103  CO2 23 25 27 25  25  GLUCOSE 105* 109* 115* 113* 110*  BUN 16 9 10 6 7   CREATININE 0.76 0.65 0.67 0.72 0.71  CALCIUM 8.6* 8.6* 8.5* 8.8* 8.5*  MG  --   --   --  2.0 2.0  PHOS  --   --   --  2.8  --    Liver Function Tests: Recent Labs  Lab 09/24/21 1925 09/28/21 0003  AST 39 17  ALT 23 22  ALKPHOS 114 113  BILITOT 1.2 0.4  PROT 7.7 7.4  ALBUMIN 3.6 3.5   No results for input(s): LIPASE, AMYLASE in the last 168 hours. No results for input(s): AMMONIA in the last 168 hours. CBC: Recent Labs  Lab 09/24/21 1925 09/25/21 0518 09/26/21 0502 09/27/21 0521 09/28/21 0003  WBC 13.5* 10.4 9.9 9.1 11.8*  NEUTROABS 9.4* 7.2 6.8 6.2 9.2*  HGB 12.0 10.9* 11.0* 10.8* 11.5*  HCT 39.0 35.3* 35.4* 36.1 36.7  MCV 74.9* 74.5* 74.7* 76.3* 73.7*  PLT 512* 397 410* 384 420*   Cardiac Enzymes: No results for input(s): CKTOTAL, CKMB, CKMBINDEX, TROPONINI in the  last 168 hours. BNP: BNP (last 3 results) Recent Labs    09/27/21 1851  BNP 248.2*    ProBNP (last 3 results) No results for input(s): PROBNP in the last 8760 hours.  CBG: Recent Labs  Lab 09/28/21 1130 09/28/21 1700 09/28/21 2222 09/29/21 0726 09/29/21 1144  GLUCAP 124* 131* 137* 111* 147*       Signed:  Kayleen Memos, MD [facility] 09/29/2021, 12:45 PM

## 2021-09-29 NOTE — Progress Notes (Signed)
1 episode emesis about 40 minutes after administration of night meds.  Pt stated that the potassium pills were too big and felt like they were stuck in her throat, causing her to feel sick.   Administered Zofran when pt continued to have nausea.  No other episodes of emesis.     Pt also stated she had someone from the hospital tell her she might have sleep apnea.  She stated she has been waking up with a start from sleep.  Pulse ox placed on pt around 2340.  Pt O2 dropped to 88 while sleeping and 2 L O2 nasal canula administered.    Will continue to monitor.

## 2021-10-01 ENCOUNTER — Encounter (HOSPITAL_COMMUNITY): Payer: Self-pay | Admitting: Emergency Medicine

## 2021-10-01 ENCOUNTER — Emergency Department (HOSPITAL_COMMUNITY): Payer: BC Managed Care – PPO

## 2021-10-01 ENCOUNTER — Inpatient Hospital Stay (HOSPITAL_COMMUNITY)
Admission: EM | Admit: 2021-10-01 | Discharge: 2021-10-04 | Disposition: A | Discharge status: Home Health | Payer: BC Managed Care – PPO | Source: Home / Self Care | Attending: Obstetrics and Gynecology | Admitting: Obstetrics and Gynecology

## 2021-10-01 DIAGNOSIS — N739 Female pelvic inflammatory disease, unspecified: Secondary | ICD-10-CM

## 2021-10-01 DIAGNOSIS — Z8616 Personal history of COVID-19: Secondary | ICD-10-CM

## 2021-10-01 DIAGNOSIS — I10 Essential (primary) hypertension: Secondary | ICD-10-CM | POA: Diagnosis present

## 2021-10-01 DIAGNOSIS — I700 Atherosclerosis of aorta: Secondary | ICD-10-CM | POA: Diagnosis present

## 2021-10-01 DIAGNOSIS — K651 Peritoneal abscess: Secondary | ICD-10-CM | POA: Diagnosis present

## 2021-10-01 DIAGNOSIS — Z8249 Family history of ischemic heart disease and other diseases of the circulatory system: Secondary | ICD-10-CM

## 2021-10-01 DIAGNOSIS — T8143XA Infection following a procedure, organ and space surgical site, initial encounter: Principal | ICD-10-CM

## 2021-10-01 DIAGNOSIS — I2699 Other pulmonary embolism without acute cor pulmonale: Secondary | ICD-10-CM | POA: Diagnosis present

## 2021-10-01 DIAGNOSIS — Z885 Allergy status to narcotic agent status: Secondary | ICD-10-CM

## 2021-10-01 DIAGNOSIS — K219 Gastro-esophageal reflux disease without esophagitis: Secondary | ICD-10-CM | POA: Diagnosis present

## 2021-10-01 DIAGNOSIS — Z79899 Other long term (current) drug therapy: Secondary | ICD-10-CM

## 2021-10-01 DIAGNOSIS — F17210 Nicotine dependence, cigarettes, uncomplicated: Secondary | ICD-10-CM | POA: Diagnosis present

## 2021-10-01 DIAGNOSIS — J9811 Atelectasis: Secondary | ICD-10-CM | POA: Diagnosis present

## 2021-10-01 DIAGNOSIS — Z833 Family history of diabetes mellitus: Secondary | ICD-10-CM

## 2021-10-01 DIAGNOSIS — R7303 Prediabetes: Secondary | ICD-10-CM | POA: Diagnosis present

## 2021-10-01 DIAGNOSIS — Z5181 Encounter for therapeutic drug level monitoring: Secondary | ICD-10-CM

## 2021-10-01 DIAGNOSIS — N200 Calculus of kidney: Secondary | ICD-10-CM | POA: Diagnosis present

## 2021-10-01 DIAGNOSIS — R109 Unspecified abdominal pain: Secondary | ICD-10-CM | POA: Diagnosis not present

## 2021-10-01 DIAGNOSIS — Z7901 Long term (current) use of anticoagulants: Secondary | ICD-10-CM

## 2021-10-01 DIAGNOSIS — N390 Urinary tract infection, site not specified: Secondary | ICD-10-CM | POA: Diagnosis present

## 2021-10-01 DIAGNOSIS — F32A Depression, unspecified: Secondary | ICD-10-CM | POA: Diagnosis present

## 2021-10-01 DIAGNOSIS — K567 Ileus, unspecified: Secondary | ICD-10-CM | POA: Diagnosis present

## 2021-10-01 DIAGNOSIS — T8143XD Infection following a procedure, organ and space surgical site, subsequent encounter: Secondary | ICD-10-CM

## 2021-10-01 DIAGNOSIS — F419 Anxiety disorder, unspecified: Secondary | ICD-10-CM | POA: Diagnosis present

## 2021-10-01 DIAGNOSIS — E669 Obesity, unspecified: Secondary | ICD-10-CM | POA: Diagnosis present

## 2021-10-01 DIAGNOSIS — Z9049 Acquired absence of other specified parts of digestive tract: Secondary | ICD-10-CM

## 2021-10-01 DIAGNOSIS — Z20822 Contact with and (suspected) exposure to covid-19: Secondary | ICD-10-CM | POA: Diagnosis present

## 2021-10-01 DIAGNOSIS — L02211 Cutaneous abscess of abdominal wall: Secondary | ICD-10-CM | POA: Diagnosis not present

## 2021-10-01 DIAGNOSIS — T8140XA Infection following a procedure, unspecified, initial encounter: Secondary | ICD-10-CM | POA: Diagnosis present

## 2021-10-01 LAB — CBC WITH DIFFERENTIAL/PLATELET
Abs Immature Granulocytes: 0.04 10*3/uL (ref 0.00–0.07)
Basophils Absolute: 0 10*3/uL (ref 0.0–0.1)
Basophils Relative: 0 %
Eosinophils Absolute: 0.7 10*3/uL — ABNORMAL HIGH (ref 0.0–0.5)
Eosinophils Relative: 6 %
HCT: 40.5 % (ref 36.0–46.0)
Hemoglobin: 12.5 g/dL (ref 12.0–15.0)
Immature Granulocytes: 0 %
Lymphocytes Relative: 21 %
Lymphs Abs: 2.2 10*3/uL (ref 0.7–4.0)
MCH: 23 pg — ABNORMAL LOW (ref 26.0–34.0)
MCHC: 30.9 g/dL (ref 30.0–36.0)
MCV: 74.6 fL — ABNORMAL LOW (ref 80.0–100.0)
Monocytes Absolute: 0.9 10*3/uL (ref 0.1–1.0)
Monocytes Relative: 9 %
Neutro Abs: 6.6 10*3/uL (ref 1.7–7.7)
Neutrophils Relative %: 64 %
Platelets: 430 10*3/uL — ABNORMAL HIGH (ref 150–400)
RBC: 5.43 MIL/uL — ABNORMAL HIGH (ref 3.87–5.11)
RDW: 16.6 % — ABNORMAL HIGH (ref 11.5–15.5)
WBC: 10.3 10*3/uL (ref 4.0–10.5)
nRBC: 0 % (ref 0.0–0.2)

## 2021-10-01 LAB — COMPREHENSIVE METABOLIC PANEL
ALT: 56 U/L — ABNORMAL HIGH (ref 0–44)
AST: 38 U/L (ref 15–41)
Albumin: 3.8 g/dL (ref 3.5–5.0)
Alkaline Phosphatase: 129 U/L — ABNORMAL HIGH (ref 38–126)
Anion gap: 10 (ref 5–15)
BUN: 10 mg/dL (ref 6–20)
CO2: 22 mmol/L (ref 22–32)
Calcium: 9.2 mg/dL (ref 8.9–10.3)
Chloride: 103 mmol/L (ref 98–111)
Creatinine, Ser: 0.72 mg/dL (ref 0.44–1.00)
GFR, Estimated: >60 mL/min (ref >60)
Glucose, Bld: 98 mg/dL (ref 70–99)
Potassium: 4.5 mmol/L (ref 3.5–5.1)
Sodium: 135 mmol/L (ref 135–145)
Total Bilirubin: 0.3 mg/dL (ref 0.3–1.2)
Total Protein: 8.7 g/dL — ABNORMAL HIGH (ref 6.5–8.1)

## 2021-10-01 LAB — URINALYSIS, ROUTINE W REFLEX MICROSCOPIC
Bilirubin Urine: NEGATIVE
Glucose, UA: NEGATIVE mg/dL
Ketones, ur: NEGATIVE mg/dL
Nitrite: NEGATIVE
Protein, ur: 100 mg/dL — AB
Specific Gravity, Urine: 1.013 (ref 1.005–1.030)
pH: 5 (ref 5.0–8.0)

## 2021-10-01 LAB — RESP PANEL BY RT-PCR (FLU A&B, COVID) ARPGX2
Influenza A by PCR: NEGATIVE
Influenza B by PCR: NEGATIVE
SARS Coronavirus 2 by RT PCR: NEGATIVE

## 2021-10-01 MED ORDER — LEVOCETIRIZINE DIHYDROCHLORIDE 5 MG PO TABS: 5.0000 mg | ORAL_TABLET | Freq: Every day | ORAL | Status: DC

## 2021-10-01 MED ORDER — DOCUSATE SODIUM 100 MG PO CAPS
100.0000 mg | ORAL_CAPSULE | Freq: Two times a day (BID) | ORAL | Status: DC
  Administered 2021-10-01 – 2021-10-04 (×6): 100 mg via ORAL
  Filled 2021-10-01 (×6): qty 1

## 2021-10-01 MED ORDER — ONDANSETRON HCL 4 MG PO TABS: 4.0000 mg | ORAL_TABLET | Freq: Four times a day (QID) | ORAL | Status: DC | PRN

## 2021-10-01 MED ORDER — ONDANSETRON HCL 4 MG/2ML IJ SOLN: 4.0000 mg | Freq: Four times a day (QID) | INTRAMUSCULAR | Status: DC | PRN

## 2021-10-01 MED ORDER — SACCHAROMYCES BOULARDII 250 MG PO CAPS
250.0000 mg | ORAL_CAPSULE | Freq: Two times a day (BID) | ORAL | Status: DC
  Administered 2021-10-01 – 2021-10-04 (×6): 250 mg via ORAL
  Filled 2021-10-01 (×6): qty 1

## 2021-10-01 MED ORDER — MORPHINE SULFATE (PF) 2 MG/ML IV SOLN
1.0000 mg | INTRAVENOUS | Status: DC | PRN
  Administered 2021-10-01 (×2): 2 mg via INTRAVENOUS
  Filled 2021-10-01 (×3): qty 1

## 2021-10-01 MED ORDER — METFORMIN HCL 500 MG PO TABS
500.0000 mg | ORAL_TABLET | Freq: Two times a day (BID) | ORAL | Status: DC
  Administered 2021-10-03 – 2021-10-04 (×2): 500 mg via ORAL
  Filled 2021-10-01 (×2): qty 1

## 2021-10-01 MED ORDER — APIXABAN 5 MG PO TABS
10.0000 mg | ORAL_TABLET | Freq: Two times a day (BID) | ORAL | Status: DC
  Administered 2021-10-01 – 2021-10-04 (×6): 10 mg via ORAL
  Filled 2021-10-01 (×6): qty 2

## 2021-10-01 MED ORDER — NICOTINE 14 MG/24HR TD PT24
14.0000 mg | MEDICATED_PATCH | TRANSDERMAL | Status: DC
  Administered 2021-10-02 – 2021-10-03 (×2): 14 mg via TRANSDERMAL
  Filled 2021-10-01 (×4): qty 1

## 2021-10-01 MED ORDER — SODIUM CHLORIDE 0.9 % IV SOLN
1.0000 g | Freq: Once | INTRAVENOUS | Status: AC
  Administered 2021-10-01: 17:00:00 1 g via INTRAVENOUS
  Filled 2021-10-01: qty 10

## 2021-10-01 MED ORDER — MENTHOL 3 MG MT LOZG: 1.0000 | LOZENGE | OROMUCOSAL | Status: DC | PRN

## 2021-10-01 MED ORDER — POLYETHYLENE GLYCOL 3350 17 G PO PACK: 17.0000 g | PACK | Freq: Every day | ORAL | Status: DC | PRN

## 2021-10-01 MED ORDER — SERTRALINE HCL 50 MG PO TABS
100.0000 mg | ORAL_TABLET | Freq: Every day | ORAL | Status: DC
  Administered 2021-10-01 – 2021-10-03 (×3): 100 mg via ORAL
  Filled 2021-10-01 (×3): qty 2

## 2021-10-01 MED ORDER — SIMETHICONE 80 MG PO CHEW
80.0000 mg | CHEWABLE_TABLET | Freq: Four times a day (QID) | ORAL | Status: DC | PRN
  Administered 2021-10-01: 80 mg via ORAL
  Filled 2021-10-01: qty 1

## 2021-10-01 MED ORDER — IOHEXOL 350 MG/ML SOLN
80.0000 mL | Freq: Once | INTRAVENOUS | Status: AC | PRN
  Administered 2021-10-01: 14:00:00 80 mL via INTRAVENOUS

## 2021-10-01 MED ORDER — SODIUM CHLORIDE 0.9 % IV SOLN
2.0000 g | INTRAVENOUS | Status: DC
  Administered 2021-10-02 – 2021-10-04 (×3): 2 g via INTRAVENOUS
  Filled 2021-10-01 (×3): qty 20

## 2021-10-01 MED ORDER — MORPHINE SULFATE (PF) 4 MG/ML IV SOLN
6.0000 mg | Freq: Once | INTRAVENOUS | Status: AC
  Administered 2021-10-01: 16:00:00 6 mg via INTRAVENOUS
  Filled 2021-10-01: qty 2

## 2021-10-01 MED ORDER — APIXABAN 5 MG PO TABS: 5.0000 mg | ORAL_TABLET | Freq: Two times a day (BID) | ORAL | Status: DC

## 2021-10-01 MED ORDER — CETIRIZINE HCL 10 MG PO TABS
10.0000 mg | ORAL_TABLET | Freq: Every day | ORAL | Status: DC
  Administered 2021-10-01 – 2021-10-03 (×3): 10 mg via ORAL
  Filled 2021-10-01 (×3): qty 1

## 2021-10-01 MED ORDER — APIXABAN (ELIQUIS) VTE STARTER PACK (10MG AND 5MG): 5.0000 mg | ORAL_TABLET | Freq: Two times a day (BID) | ORAL | Status: DC

## 2021-10-01 MED ORDER — OXYCODONE HCL 5 MG PO TABS
5.0000 mg | ORAL_TABLET | ORAL | Status: DC | PRN
  Administered 2021-10-01 – 2021-10-04 (×10): 5 mg via ORAL
  Filled 2021-10-01 (×11): qty 1

## 2021-10-01 MED ORDER — AMLODIPINE BESYLATE 5 MG PO TABS
10.0000 mg | ORAL_TABLET | Freq: Every day | ORAL | Status: DC
  Administered 2021-10-01 – 2021-10-03 (×3): 10 mg via ORAL
  Filled 2021-10-01 (×3): qty 2

## 2021-10-01 MED ORDER — ACETAMINOPHEN 500 MG PO TABS
1000.0000 mg | ORAL_TABLET | Freq: Four times a day (QID) | ORAL | Status: DC | PRN
  Administered 2021-10-02 – 2021-10-03 (×3): 1000 mg via ORAL
  Filled 2021-10-01 (×2): qty 2

## 2021-10-01 MED ORDER — SODIUM CHLORIDE 0.9 % IV SOLN: INTRAVENOUS | Status: DC

## 2021-10-01 MED ORDER — METRONIDAZOLE 500 MG/100ML IV SOLN
500.0000 mg | Freq: Two times a day (BID) | INTRAVENOUS | Status: DC
  Administered 2021-10-01 – 2021-10-03 (×4): 500 mg via INTRAVENOUS
  Filled 2021-10-01 (×4): qty 100

## 2021-10-01 MED ORDER — LIP MEDEX EX OINT
TOPICAL_OINTMENT | CUTANEOUS | Status: AC
  Filled 2021-10-01: qty 7

## 2021-10-01 NOTE — Progress Notes (Signed)
Returned pt to ED lobby w/IV, ok'd per [place] Main.

## 2021-10-01 NOTE — ED Provider Notes (Signed)
Emergency Medicine Provider Triage Evaluation Note  Brianna Gutierrez , Gutierrez 45 y.o. female  was evaluated in triage.  Pt complains of left lower quadrant pain.  Recently discharged here.  Had vaginal partial hysterectomy by Dr. Quincy Simmonds.  CT scan showed abscess and possible ileus however IR did not feel large enough to drain.  States she is having worsening pain, she has had some chills without documented fever.  Of note did have PE on prior imaging as well.  She is chronically anticoagulated, has not missed any doses.  She denies any chest pain, shortness of breath.  Very tearful in room.  No vaginal bleeding, vaginal discharge  Review of Systems  Positive: Abdominal pain  Negative: Chest pain, shortness of breath, cough  Physical Exam  BP (!) 165/111 (BP Location: Left Arm)   Pulse 84   Temp 97.8 F (36.6 C) (Oral)   Resp 18   LMP 08/18/2021 (Exact Date)   SpO2 96%  Gen:   Awake, no distress   Resp:  Normal effort  MSK:   Moves extremities without difficulty  ABD:  Distended, diffusely tender worse left lower quadrant Other:    Medical Decision Making  Medically screening exam initiated at 12:48 PM.  Appropriate orders placed.  Brianna Gutierrez was informed that the remainder of the evaluation will be completed by another provider, this initial triage assessment does not replace that evaluation, and the importance of remaining in the ED until their evaluation is complete.  Left lower quadrant pain   Brianna Mick A, PA-C 10/01/21 1249    Brianna Rank, MD 10/01/21 1521

## 2021-10-01 NOTE — ED Provider Notes (Signed)
[facility] DEPT Provider Note   CSN: [id number] Arrival date & time: 10/01/21  1233     History Chief Complaint  Patient presents with   Abdominal Pain    Brianna Gutierrez is a 45 y.o. female.  The history is provided by the patient and medical records. No language interpreter was used.  Abdominal Pain  45 year old female significant history of obesity, hypertension, prediabetes, recently had a hysterectomy and bilateral salpingoectomy on 09/11/2021 by gynecologist Dr. Pasty Arch who presenting to the ED with complaints of worsening lower abdominal pain.  Patient was previously admitted to the hospital from 11/27 until discharge on 12/2 due to having lower abdominal pain.  She was found to have a possible developing abscess in the surgical bed and left adnexa.  She was given antibiotic.  Her hospital course was complicated due to intractable nausea and was diagnosed with ileus.  Furthermore, incidentally patient was found to have acute PE to her right lower lobe.  Patient was started on Eliquis.  During hospital course, was consulted for the developing abscess measuring 4.8 cm x 4.1 cm x 4.6 cm but was felt that it was not amenable for drainage at that time.  Patient was discharged home with antibiotic, and pain medication but despite taking the medication her pain is becoming progressively worse.  She described diffuse abdominal pain more significant to her left side of her abdomen.  Pain is soreness and sharp and persistent.  Pain not adequately control with medication.  She denies having fever, nausea, vaginal bleeding, or change in bowel movement.  She does report increasing pain with urination but denies urinary frequency.  Past Medical History:  Diagnosis Date   Abnormal Pap smear of cervix    Anxiety    Depression    Dysmenorrhea    Eczema    GERD (gastroesophageal reflux disease)    History of COVID-19 10/24/2020   loss of taste and smellbody aches and  cough x 4 days all symptoms resolved   Hypertension    Pre-diabetes    Rash 08/31/2021   comes and goes on legs saw dermetology no rash now ? food allergy   SUI (stress urinary incontinence, female)    Trichomonas infection 02/2021   Uterine fibroid     Patient Active Problem List   Diagnosis Date Noted   Pelvic infection in female 09/24/2021   Status post laparoscopic hysterectomy 09/11/2021   Cholelithiasis 01/13/2019   Pre-diabetes 11/15/2016   History of bilateral tubal ligation 09/28/2015   Tobacco abuse 09/28/2015    Past Surgical History:  Procedure Laterality Date   BLADDER SUSPENSION N/A 09/11/2021   Procedure: TRANSVAGINAL TAPE (TVT) PROCEDURE;  Surgeon: [name], MD;  Location: [facility];  Service: Gynecology;  Laterality: N/A;   CESAREAN SECTION  1997   x 1   CHOLECYSTECTOMY N/A 01/14/2019   Procedure: LAPAROSCOPIC CHOLECYSTECTOMY WITH POSSIBLE  INTRAOPERATIVE CHOLANGIOGRAM;  Surgeon: Armandina Gemma, MD;  Location: [facility];  Service: General;  Laterality: N/A;   CYSTOSCOPY N/A 09/11/2021   Procedure: Consuela Mimes;  Surgeon: [name], MD;  Location[facility];  Service: Gynecology;  Laterality: N/A;   DILITATION & CURRETTAGE/HYSTROSCOPY WITH HYDROTHERMAL ABLATION N/A 06/22/2016   Procedure: DILATATION & CURETTAGE/HYSTEROSCOPY WITH HYDROTHERMAL ABLATION;  Surgeon: Shelly Bombard, MD;  Location: [facility];  Service: Gynecology;  Laterality: N/A;   TOTAL LAPAROSCOPIC HYSTERECTOMY WITH SALPINGECTOMY Bilateral 09/11/2021   Procedure: TOTAL LAPAROSCOPIC HYSTERECTOMY WITH SALPINGECTOMY;  Surgeon: [name], MD;  Location: [facility];  Service: Gynecology;  Laterality: Bilateral;   TUBAL LIGATION     09-28-2015     OB History     Gravida  4   Para      Term      Preterm      AB      Living  4      SAB      IAB      Ectopic      Multiple      Live Births  4            Family History  Problem Relation Age of Onset   Hypertension Maternal Grandmother    Diabetes Maternal Grandfather    Colon cancer Neg Hx    Esophageal cancer Neg Hx    Rectal cancer Neg Hx    Stomach cancer Neg Hx     Social History   Tobacco Use   Smoking status: Every Day    Packs/day: 0.10    Years: 20.00    Pack years: 2.00    Types: Cigarettes   Smokeless tobacco: Never  Vaping Use   Vaping Use: Never used  Substance Use Topics   Alcohol use: Not Currently   Drug use: Never    Home Medications Prior to Admission medications   Medication Sig Start Date End Date Taking? Authorizing Provider  amLODipine (NORVASC) 10 MG tablet Take 1 tablet (10 mg total) by mouth daily. Patient taking differently: Take 10 mg by mouth at bedtime. 07/31/21 10/29/21  Fenton Foy, NP  amoxicillin-clavulanate (AUGMENTIN) 875-125 MG tablet Take 1 tablet by mouth every 12 (twelve) hours for 10 days. 09/29/21 10/09/21  Kayleen Memos, DO  apixaban (ELIQUIS) 5 MG TABS tablet Take 1 tablet (5 mg total) by mouth 2 (two) times daily. 10/30/21   Kayleen Memos, DO  APIXABAN Arne Cleveland) VTE STARTER PACK (10MG  AND 5MG ) Take as directed on package: start with two-5mg  tablets twice daily for 7 days. On day 8, switch to one-5mg  tablet twice daily. 09/29/21   Kayleen Memos, DO  betamethasone dipropionate 0.05 % cream Apply 1 application topically daily as needed (itching/ allergic reaction). 09/24/21   [provider]  levocetirizine (XYZAL) 5 MG tablet Take 5 mg by mouth at bedtime. 06/09/21   [provider]  metFORMIN (GLUCOPHAGE) 500 MG tablet Take 1 tablet (500 mg total) by mouth 2 (two) times daily with a meal. 09/29/21 12/28/21  Kayleen Memos, DO  nicotine (NICODERM CQ - DOSED IN MG/24 HOURS) 14 mg/24hr patch Place 1 patch (14 mg total) onto the skin daily. 09/29/21 12/28/21  Kayleen Memos, DO  oxyCODONE (OXY IR/ROXICODONE) 5 MG immediate release tablet Take 1 tablet (5 mg total) by  mouth every 8 (eight) hours as needed for up to 5 days for breakthrough pain or moderate pain. 09/29/21 10/04/21  Kayleen Memos, DO  polyethylene glycol (MIRALAX) 17 g packet Take 17 g by mouth daily as needed. 09/29/21   Kayleen Memos, DO  Prenatal Vit-Fe Fumarate-FA (PRENATAL MULTIVITAMIN) TABS tablet Take 1 tablet by mouth daily at 12 noon. 09/30/21 12/29/21  Kayleen Memos, DO  saccharomyces boulardii (FLORASTOR) 250 MG capsule Take 1 capsule (250 mg total) by mouth 2 (two) times daily for 14 days. 09/29/21 10/13/21  Kayleen Memos, DO  sertraline (ZOLOFT) 100 MG tablet Take 1 tablet (100 mg total) by mouth daily. Patient  taking differently: Take 100 mg by mouth at bedtime. 07/31/21 10/29/21  Fenton Foy, NP  Simethicone (GAS-X MAXIMUM STRENGTH PO) Take 1 tablet by mouth at bedtime as needed (gas/ bloating).    [provider]  dicyclomine (BENTYL) 20 MG tablet Take 1 tablet (20 mg total) by mouth 2 (two) times daily. 10/29/19 08/24/20  Ok Edwards, PA-C  omeprazole (PRILOSEC) 20 MG capsule Take 1 capsule (20 mg total) by mouth daily. 01/01/19 08/07/19  Scot Jun, FNP    Allergies    Tramadol  Review of Systems   Review of Systems  Gastrointestinal:  Positive for abdominal pain.  All other systems reviewed and are negative.  Physical Exam Updated Vital Signs BP (!) 141/94 (BP Location: Right Arm)   Pulse 82   Temp 97.8 F (36.6 C) (Oral)   Resp 17   LMP 08/18/2021 (Exact Date)   SpO2 99%   Physical Exam Vitals and nursing note reviewed.  Constitutional:      General: She is not in acute distress.    Appearance: She is well-developed. She is obese.  HENT:     Head: Atraumatic.  Eyes:     Conjunctiva/sclera: Conjunctivae normal.  Cardiovascular:     Rate and Rhythm: Normal rate and regular rhythm.     Pulses: Normal pulses.     Heart sounds: Normal heart sounds.  Pulmonary:     Effort: Pulmonary effort is normal.  Abdominal:     Palpations: Abdomen is soft.      Tenderness: There is abdominal tenderness (Tenderness to left side abdomen on palpation with guarding but no rebound tenderness).  Musculoskeletal:     Cervical back: Neck supple.  Skin:    Findings: No rash.  Neurological:     Mental Status: She is alert. Mental status is at baseline.  Psychiatric:        Mood and Affect: Mood normal.    ED Results / Procedures / Treatments   Labs (all labs ordered are listed, but only abnormal results are displayed) Labs Reviewed  CBC WITH DIFFERENTIAL/PLATELET - Abnormal; Notable for the following components:      Result Value   RBC 5.43 (*)    MCV 74.6 (*)    MCH 23.0 (*)    RDW 16.6 (*)    Platelets 430 (*)    Eosinophils Absolute 0.7 (*)    All other components within normal limits  COMPREHENSIVE METABOLIC PANEL - Abnormal; Notable for the following components:   Total Protein 8.7 (*)    ALT 56 (*)    Alkaline Phosphatase 129 (*)    All other components within normal limits  URINALYSIS, ROUTINE W REFLEX MICROSCOPIC - Abnormal; Notable for the following components:   APPearance HAZY (*)    Hgb urine dipstick SMALL (*)    Protein, ur 100 (*)    Leukocytes,Ua LARGE (*)    Bacteria, UA RARE (*)    All other components within normal limits    EKG EKG Interpretation  Date/Time:  Sunday October 01 2021 12:50:17 EST Ventricular Rate:  88 PR Interval:  149 QRS Duration: 76 QT Interval:  350 QTC Calculation: 424 R Axis:   52 Text Interpretation: Sinus rhythm Biatrial enlargement Nonspecific T abnormalities, anterior leads No significant change since last tracing Confirmed by Dorie Rank [phone]) on 10/01/2021 3:21:05 PM  Radiology CT Abdomen Pelvis W Contrast  Result Date: 10/01/2021 CLINICAL DATA:  LEFT lower quadrant abdominal pain. Known pelvic abscess status post hysterectomy,  worsening pain. EXAM: CT ABDOMEN AND PELVIS WITH CONTRAST TECHNIQUE: Multidetector CT imaging of the abdomen and pelvis was performed using the standard  protocol following bolus administration of intravenous contrast. CONTRAST:  61mL OMNIPAQUE IOHEXOL 350 MG/ML SOLN COMPARISON:  CT abdomen dated 09/27/2021. FINDINGS: Lower chest: Mild bibasilar atelectasis. Hepatobiliary: Ill-defined hypodensity within the LEFT liver lobe, stable in the short-term interval at 1.8 cm, suspected hemangioma. No other focal abnormality is seen within the liver. Status post cholecystectomy. No bile duct dilatation seen. Pancreas: Unremarkable. No pancreatic ductal dilatation or surrounding inflammatory changes. Spleen: Normal in size without focal abnormality. Adrenals/Urinary Tract: Adrenal glands appear normal. 7 mm LEFT renal stone. RIGHT renal cortex scarring. Kidneys otherwise unremarkable without hydronephrosis. No ureteral or bladder calculi are identified. Bladder is decompressed. Stomach/Bowel: No dilated large or small bowel loops. No evidence of bowel wall inflammation. Appendix is normal. Stomach is unremarkable, partially decompressed. Vascular/Lymphatic: No vascular abnormality is identified. No enlarged lymph nodes are seen within the abdomen or pelvis. Reproductive: Surgical changes of recent hysterectomy. The portion of the previously described complex fluid collection in the LEFT adnexal region has enlarged, likely enveloping the LEFT ovary with a combined thickness that now measures 4.2 cm (previously 2.3 cm). The additional multiloculated components of the complex fluid/phlegmon collection within the more inferior pelvis appear stable, the fluid-like component measuring up to 4.4 cm greatest dimension. Overall extent of the complex fluid collection is stable. No extension of the complex fluid collection to the RIGHT hemipelvis or into the lower abdomen. Other: No free intraperitoneal air. Musculoskeletal: Osseous structures of the abdomen and pelvis are unremarkable. Superficial soft tissues of the abdomen and pelvis are unremarkable. IMPRESSION: 1. Overall extent of  the previously described complex fluid collection (presumed abscess) in the pelvis is stable, however, the portion in the LEFT adnexal region has slightly enlarged, likely enveloping the LEFT ovary with a combined thickness that now measures 4.2 cm (previously 2.3 cm). 2. 7 mm LEFT renal stone. No hydronephrosis. RIGHT renal cortex scarring. 3. Ill-defined hypodensity within the LEFT liver lobe, stable in the short-term interval at 1.8 cm, suspected hemangioma. Consider nonemergent MRI of the liver after current issues are resolved to confirm benignity. Aortic Atherosclerosis (ICD10-I70.0). Electronically Signed   By: Franki Cabot M.D.   On: 10/01/2021 15:32    Procedures Procedures   Medications Ordered in ED Medications  iohexol (OMNIPAQUE) 350 MG/ML injection 80 mL (80 mLs Intravenous Contrast Given 10/01/21 1414)  morphine 4 MG/ML injection 6 mg (6 mg Intravenous Given 10/01/21 1626)  cefTRIAXone (ROCEPHIN) 1 g in sodium chloride 0.9 % 100 mL IVPB (0 g Intravenous Stopped 10/01/21 1725)    ED Course  I have reviewed the triage vital signs and the nursing notes.  Pertinent labs & imaging results that were available during my care of the patient were reviewed by me and considered in my medical decision making (see chart for details).    MDM Rules/Calculators/A&P                           BP 126/90   Pulse 85   Temp 97.8 F (36.6 C) (Oral)   Resp 14   LMP 08/18/2021 (Exact Date)   SpO2 100%   Final Clinical Impression(s) / ED Diagnoses Final diagnoses:  Postoperative intra-abdominal abscess  Lower urinary tract infectious disease    Rx / DC Orders ED Discharge Orders     None  4:22 PM Patient here with progressive worsening abdominal pain.  She was recently released to the hospital 2 days ago after an extended hospital course with developing pelvic abscess status post a partial hysterectomy performed on 11/14.  Due to worsening pain, a repeat abdominal CT scan was  performed today which demonstrates fairly stable complex fluid collection in the pelvis however the portion of the left adnexal region has slightly enlarged, likely enveloping the left ovary with combined thickness that is now measuring 4.2 cm when it was previously 2.3 cm.  With this finding, plan to reach out to IR to perform I&D.  Will consider admission for both pain control and further management of this complex fluid/phlegmon collection.  Care discussed with Dr. Francia Greaves.   UA today shows large leukocyte esterase as well as 6-10 WBC.  Since patient did complain of some urinary discomfort, will initiate antibiotic Rocephin.  4:55 PM Appreciate consultation from IR radiologist DR. Laurence Ferrari who reviewed the imaging and felt it would be to the patient's best interest if GYN can drain the abscess through the vaginal cuff instead of IR percutaneous drainage.  Will consult GYN.    5:00 PM I have left a message to on call GYN for [place], [place] [phone]) and left a message.    5:21 PM Appreciate consultation from GYN Dr. Talbert Nan who agrees to see pt in the ER to help determine disposition.   6:10 PM Dr. Talbert Nan has seen and evaluated pt and plan to admit pt for further management of her condition.    Domenic Moras, PA-C 10/01/21 1811    Valarie Merino, MD 10/01/21 2249

## 2021-10-01 NOTE — ED Triage Notes (Signed)
PT c/o lower abdominal pain with recent admission after partial hysterectomy. States she had post op infection and PE. Reports compliance with medications.

## 2021-10-01 NOTE — H&P (Signed)
Brianna Gutierrez is an 45 y.o. female presents POD #20 s/p TLH/LOA/TVT c/o increasing pain. She was admitted on 09/24/21 with a post operative infection, suspected abscess. She was treated with IV Zosyn with improvement of her pain and normalization of her WBC (she was never febrile). That hospital admission was complicated by development of a pulmonary embolism. The embolism was asymptomatic, it was incidentally noted on a CT scan to f/u on her pelvic abscess. The embolism was treated with IV heparin, then changed to Eliquis.  She was D/C'd to home on 09/29/21 on Augmentin and Eliquis. She states that she was initially doing well. Last night she developed severe pain in her LLQ. The pain is constant. No fevers, no nausea, no emesis, normal BM, slight burning at the end of urination. No vaginal discharge.     Menstrual History: Patient's last menstrual period was 08/18/2021 (exact date).   No current facility-administered medications on file prior to encounter.   Current Outpatient Medications on File Prior to Encounter  Medication Sig Dispense Refill   amLODipine (NORVASC) 10 MG tablet Take 1 tablet (10 mg total) by mouth daily. (Patient taking differently: Take 10 mg by mouth at bedtime.) 90 tablet 0   amoxicillin-clavulanate (AUGMENTIN) 875-125 MG tablet Take 1 tablet by mouth every 12 (twelve) hours for 10 days. 20 tablet 0   [START ON 10/30/2021] apixaban (ELIQUIS) 5 MG TABS tablet Take 1 tablet (5 mg total) by mouth 2 (two) times daily. 60 tablet 0   APIXABAN (ELIQUIS) VTE STARTER PACK (10MG  AND 5MG ) Take as directed on package: start with two-5mg  tablets twice daily for 7 days. On day 8, switch to one-5mg  tablet twice daily. 1 each 0   betamethasone dipropionate 0.05 % cream Apply 1 application topically daily as needed (itching/ allergic reaction).     levocetirizine (XYZAL) 5 MG tablet Take 5 mg by mouth at bedtime.     metFORMIN (GLUCOPHAGE) 500 MG tablet Take 1 tablet (500 mg total) by mouth 2  (two) times daily with a meal. 180 tablet 0   nicotine (NICODERM CQ - DOSED IN MG/24 HOURS) 14 mg/24hr patch Place 1 patch (14 mg total) onto the skin daily. 90 patch 0   oxyCODONE (OXY IR/ROXICODONE) 5 MG immediate release tablet Take 1 tablet (5 mg total) by mouth every 8 (eight) hours as needed for up to 5 days for breakthrough pain or moderate pain. 15 tablet 0   polyethylene glycol (MIRALAX) 17 g packet Take 17 g by mouth daily as needed. 14 each 0   Prenatal Vit-Fe Fumarate-FA (PRENATAL MULTIVITAMIN) TABS tablet Take 1 tablet by mouth daily at 12 noon. 90 tablet 0   saccharomyces boulardii (FLORASTOR) 250 MG capsule Take 1 capsule (250 mg total) by mouth 2 (two) times daily for 14 days. 28 capsule 0   sertraline (ZOLOFT) 100 MG tablet Take 1 tablet (100 mg total) by mouth daily. (Patient taking differently: Take 100 mg by mouth at bedtime.) 90 tablet 0   Simethicone (GAS-X MAXIMUM STRENGTH PO) Take 1 tablet by mouth at bedtime as needed (gas/ bloating).     [DISCONTINUED] dicyclomine (BENTYL) 20 MG tablet Take 1 tablet (20 mg total) by mouth 2 (two) times daily. 20 tablet 0   [DISCONTINUED] omeprazole (PRILOSEC) 20 MG capsule Take 1 capsule (20 mg total) by mouth daily. 30 capsule 1    Past Medical History:  Diagnosis Date   Abnormal Pap smear of cervix    Anxiety    Depression  Dysmenorrhea    Eczema    GERD (gastroesophageal reflux disease)    History of COVID-19 10/24/2020   loss of taste and smellbody aches and cough x 4 days all symptoms resolved   Hypertension    Pre-diabetes    Rash 08/31/2021   comes and goes on legs saw dermetology no rash now ? food allergy   SUI (stress urinary incontinence, female)    Trichomonas infection 02/2021   Uterine fibroid     Past Surgical History:  Procedure Laterality Date   BLADDER SUSPENSION N/A 09/11/2021   Procedure: TRANSVAGINAL TAPE (TVT) PROCEDURE;  Surgeon: [name], MD;  Location: [facility];   Service: Gynecology;  Laterality: N/A;   CESAREAN SECTION  1997   x 1   CHOLECYSTECTOMY N/A 01/14/2019   Procedure: LAPAROSCOPIC CHOLECYSTECTOMY WITH POSSIBLE  INTRAOPERATIVE CHOLANGIOGRAM;  Surgeon: Armandina Gemma, MD;  Location: [facility];  Service: General;  Laterality: N/A;   CYSTOSCOPY N/A 09/11/2021   Procedure: Consuela Mimes;  Surgeon: [name], MD;  Location: [facility];  Service: Gynecology;  Laterality: N/A;   DILITATION & CURRETTAGE/HYSTROSCOPY WITH HYDROTHERMAL ABLATION N/A 06/22/2016   Procedure: DILATATION & CURETTAGE/HYSTEROSCOPY WITH HYDROTHERMAL ABLATION;  Surgeon: Shelly Bombard, MD;  Location: [facility];  Service: Gynecology;  Laterality: N/A;   TOTAL LAPAROSCOPIC HYSTERECTOMY WITH SALPINGECTOMY Bilateral 09/11/2021   Procedure: TOTAL LAPAROSCOPIC HYSTERECTOMY WITH SALPINGECTOMY;  Surgeon: [name], MD;  Location: [facility];  Service: Gynecology;  Laterality: Bilateral;   TUBAL LIGATION     09-28-2015    Family History  Problem Relation Age of Onset   Hypertension Maternal Grandmother    Diabetes Maternal Grandfather    Colon cancer Neg Hx    Esophageal cancer Neg Hx    Rectal cancer Neg Hx    Stomach cancer Neg Hx     Social History:  reports that she has been smoking cigarettes. She has a 2.00 pack-year smoking history. She has never used smokeless tobacco. She reports that she does not currently use alcohol. She reports that she does not use drugs.  Allergies:  Allergies  Allergen Reactions   Tramadol Other (See Comments)    Headache    (Not in a hospital admission)   Review of Systems  Blood pressure 126/90, pulse 85, temperature 97.8 F (36.6 C), temperature source Oral, resp. rate 14, last menstrual period 08/18/2021, SpO2 100 %. Physical Exam Constitutional:      General: She is not in acute distress.    Appearance: She is well-developed.  Cardiovascular:     Rate and Rhythm: Normal rate  and regular rhythm.  Pulmonary:     Effort: Pulmonary effort is normal.     Breath sounds: Normal breath sounds.  Abdominal:     General: Bowel sounds are normal.     Palpations: Abdomen is soft.     Tenderness: There is abdominal tenderness in the left lower quadrant. There is no guarding or rebound.  Genitourinary:    Comments: Bimanual exam: the vaginal cuff is intact, mildly tender, minor fullness at the cuff.  Neurological:     Mental Status: She is alert.  Ext: not tender, no edema.   Results for orders placed or performed during the hospital encounter of 10/01/21 (from the past 24 hour(s))  CBC with Differential     Status: Abnormal   Collection Time: 10/01/21  1:13 PM  Result Value Ref Range   WBC  10.3 4.0 - 10.5 K/uL   RBC 5.43 (H) 3.87 - 5.11 MIL/uL   Hemoglobin 12.5 12.0 - 15.0 g/dL   HCT 40.5 36.0 - 46.0 %   MCV 74.6 (L) 80.0 - 100.0 fL   MCH 23.0 (L) 26.0 - 34.0 pg   MCHC 30.9 30.0 - 36.0 g/dL   RDW 16.6 (H) 11.5 - 15.5 %   Platelets 430 (H) 150 - 400 K/uL   nRBC 0.0 0.0 - 0.2 %   Neutrophils Relative % 64 %   Neutro Abs 6.6 1.7 - 7.7 K/uL   Lymphocytes Relative 21 %   Lymphs Abs 2.2 0.7 - 4.0 K/uL   Monocytes Relative 9 %   Monocytes Absolute 0.9 0.1 - 1.0 K/uL   Eosinophils Relative 6 %   Eosinophils Absolute 0.7 (H) 0.0 - 0.5 K/uL   Basophils Relative 0 %   Basophils Absolute 0.0 0.0 - 0.1 K/uL   Immature Granulocytes 0 %   Abs Immature Granulocytes 0.04 0.00 - 0.07 K/uL  Comprehensive metabolic panel     Status: Abnormal   Collection Time: 10/01/21  1:13 PM  Result Value Ref Range   Sodium 135 135 - 145 mmol/L   Potassium 4.5 3.5 - 5.1 mmol/L   Chloride 103 98 - 111 mmol/L   CO2 22 22 - 32 mmol/L   Glucose, Bld 98 70 - 99 mg/dL   BUN 10 6 - 20 mg/dL   Creatinine, Ser 0.72 0.44 - 1.00 mg/dL   Calcium 9.2 8.9 - 10.3 mg/dL   Total Protein 8.7 (H) 6.5 - 8.1 g/dL   Albumin 3.8 3.5 - 5.0 g/dL   AST 38 15 - 41 U/L   ALT 56 (H) 0 - 44 U/L   Alkaline  Phosphatase 129 (H) 38 - 126 U/L   Total Bilirubin 0.3 0.3 - 1.2 mg/dL   GFR, Estimated >60 >60 mL/min   Anion gap 10 5 - 15  Urinalysis, Routine w reflex microscopic Urine, Clean Catch     Status: Abnormal   Collection Time: 10/01/21  1:13 PM  Result Value Ref Range   Color, Urine YELLOW YELLOW   APPearance HAZY (A) CLEAR   Specific Gravity, Urine 1.013 1.005 - 1.030   pH 5.0 5.0 - 8.0   Glucose, UA NEGATIVE NEGATIVE mg/dL   Hgb urine dipstick SMALL (A) NEGATIVE   Bilirubin Urine NEGATIVE NEGATIVE   Ketones, ur NEGATIVE NEGATIVE mg/dL   Protein, ur 100 (A) NEGATIVE mg/dL   Nitrite NEGATIVE NEGATIVE   Leukocytes,Ua LARGE (A) NEGATIVE   RBC / HPF 6-10 0 - 5 RBC/hpf   WBC, UA 6-10 0 - 5 WBC/hpf   Bacteria, UA RARE (A) NONE SEEN   Squamous Epithelial / LPF 11-20 0 - 5   Mucus PRESENT     CT Abdomen Pelvis W Contrast  Result Date: 10/01/2021 CLINICAL DATA:  LEFT lower quadrant abdominal pain. Known pelvic abscess status post hysterectomy, worsening pain. EXAM: CT ABDOMEN AND PELVIS WITH CONTRAST TECHNIQUE: Multidetector CT imaging of the abdomen and pelvis was performed using the standard protocol following bolus administration of intravenous contrast. CONTRAST:  40mL OMNIPAQUE IOHEXOL 350 MG/ML SOLN COMPARISON:  CT abdomen dated 09/27/2021. FINDINGS: Lower chest: Mild bibasilar atelectasis. Hepatobiliary: Ill-defined hypodensity within the LEFT liver lobe, stable in the short-term interval at 1.8 cm, suspected hemangioma. No other focal abnormality is seen within the liver. Status post cholecystectomy. No bile duct dilatation seen. Pancreas: Unremarkable. No pancreatic ductal dilatation or surrounding inflammatory changes.  Spleen: Normal in size without focal abnormality. Adrenals/Urinary Tract: Adrenal glands appear normal. 7 mm LEFT renal stone. RIGHT renal cortex scarring. Kidneys otherwise unremarkable without hydronephrosis. No ureteral or bladder calculi are identified. Bladder is  decompressed. Stomach/Bowel: No dilated large or small bowel loops. No evidence of bowel wall inflammation. Appendix is normal. Stomach is unremarkable, partially decompressed. Vascular/Lymphatic: No vascular abnormality is identified. No enlarged lymph nodes are seen within the abdomen or pelvis. Reproductive: Surgical changes of recent hysterectomy. The portion of the previously described complex fluid collection in the LEFT adnexal region has enlarged, likely enveloping the LEFT ovary with a combined thickness that now measures 4.2 cm (previously 2.3 cm). The additional multiloculated components of the complex fluid/phlegmon collection within the more inferior pelvis appear stable, the fluid-like component measuring up to 4.4 cm greatest dimension. Overall extent of the complex fluid collection is stable. No extension of the complex fluid collection to the RIGHT hemipelvis or into the lower abdomen. Other: No free intraperitoneal air. Musculoskeletal: Osseous structures of the abdomen and pelvis are unremarkable. Superficial soft tissues of the abdomen and pelvis are unremarkable. IMPRESSION: 1. Overall extent of the previously described complex fluid collection (presumed abscess) in the pelvis is stable, however, the portion in the LEFT adnexal region has slightly enlarged, likely enveloping the LEFT ovary with a combined thickness that now measures 4.2 cm (previously 2.3 cm). 2. 7 mm LEFT renal stone. No hydronephrosis. RIGHT renal cortex scarring. 3. Ill-defined hypodensity within the LEFT liver lobe, stable in the short-term interval at 1.8 cm, suspected hemangioma. Consider nonemergent MRI of the liver after current issues are resolved to confirm benignity. Aortic Atherosclerosis (ICD10-I70.0). Electronically Signed   By: Franki Cabot M.D.   On: 10/01/2021 15:32    Assessment/Plan: 1) POD #20 s/p TLH/LOA/TVT admitted on 11/27 with a post op infection, discharged to home on 09/29/21 on po Augmentin, now  presents with increased pain and a increase in the fluid collection (presumed abscess) around the left ovary is slightly enlarged, the fluid collection in the pelvis is stable. Plan: admit on ceftriaxone and flagyl Will get a consultation from interventional radiology about drainage, in the am The patient is on eliquis, will need to consider this in any procedure  2) H/O recent PE, on Eliquis, will continue Eliquis for now   Salvadore Dom 10/01/2021, 6:10 PM

## 2021-10-02 ENCOUNTER — Other Ambulatory Visit: Payer: Self-pay

## 2021-10-02 ENCOUNTER — Encounter (HOSPITAL_COMMUNITY): Payer: Self-pay | Admitting: Obstetrics and Gynecology

## 2021-10-02 ENCOUNTER — Telehealth: Payer: Self-pay

## 2021-10-02 LAB — COMPREHENSIVE METABOLIC PANEL
ALT: 53 U/L — ABNORMAL HIGH (ref 0–44)
AST: 33 U/L (ref 15–41)
Albumin: 3.4 g/dL — ABNORMAL LOW (ref 3.5–5.0)
Alkaline Phosphatase: 116 U/L (ref 38–126)
Anion gap: 6 (ref 5–15)
BUN: 9 mg/dL (ref 6–20)
CO2: 25 mmol/L (ref 22–32)
Calcium: 9 mg/dL (ref 8.9–10.3)
Chloride: 105 mmol/L (ref 98–111)
Creatinine, Ser: 0.72 mg/dL (ref 0.44–1.00)
GFR, Estimated: >60 mL/min (ref >60)
Glucose, Bld: 114 mg/dL — ABNORMAL HIGH (ref 70–99)
Potassium: 3.9 mmol/L (ref 3.5–5.1)
Sodium: 136 mmol/L (ref 135–145)
Total Bilirubin: 0.3 mg/dL (ref 0.3–1.2)
Total Protein: 7.7 g/dL (ref 6.5–8.1)

## 2021-10-02 LAB — CBC
HCT: 37.5 % (ref 36.0–46.0)
Hemoglobin: 11.6 g/dL — ABNORMAL LOW (ref 12.0–15.0)
MCH: 22.8 pg — ABNORMAL LOW (ref 26.0–34.0)
MCHC: 30.9 g/dL (ref 30.0–36.0)
MCV: 73.8 fL — ABNORMAL LOW (ref 80.0–100.0)
Platelets: 416 10*3/uL — ABNORMAL HIGH (ref 150–400)
RBC: 5.08 MIL/uL (ref 3.87–5.11)
RDW: 16.1 % — ABNORMAL HIGH (ref 11.5–15.5)
WBC: 9.5 10*3/uL (ref 4.0–10.5)
nRBC: 0 % (ref 0.0–0.2)

## 2021-10-02 MED ORDER — HYDROMORPHONE HCL 1 MG/ML IJ SOLN
0.2000 mg | INTRAMUSCULAR | Status: DC | PRN
Start: 2021-10-02 — End: 2021-10-04
  Administered 2021-10-02 (×3): 0.5 mg via INTRAVENOUS
  Filled 2021-10-02 (×3): qty 0.5

## 2021-10-02 MED ORDER — FAMOTIDINE 20 MG PO TABS
10.0000 mg | ORAL_TABLET | Freq: Every day | ORAL | Status: DC
  Administered 2021-10-02 – 2021-10-03 (×2): 10 mg via ORAL
  Filled 2021-10-02 (×2): qty 1

## 2021-10-02 NOTE — Progress Notes (Signed)
Status post total laparoscopic hysterectomy, bilateral salpingectomy, TVT Exact midurethral sling, cystoscopy - POD number 21, Right PE, Left adnexal abscess.  Subjective: Patient reports pain has decreased.  She is using Oxy IR for pain control.  Tolerating regular diet.  Would like something to treat acid reflux.  Had a lot of pain when she had a bowel movement today.  Walking in the halls and standing causes increased pain.   Interventional Radiology has indicated that the left adnexal fluid collection cannot be drained percutaneously.   Infectious Disease consultation requested earlier today.   Objective: I have reviewed patient's vitals.  Vitals:   10/02/21 0408 10/02/21 1318  BP: 122/80 (!) 140/92  Pulse: 82 (!) 117  Resp: 18 14  Temp: 98 F (36.7 C) 99.2 F (37.3 C)  SpO2: 94% 94%    General: alert and cooperative Resp: clear to auscultation bilaterally Cardio: regular rate and rhythm, S1, S2 normal, no murmur, click, rub or gallop GI: active bowel sounds, protruberant, tender left lower quadrant, no guarding or rebound.   No masses.  Assessment: Status post total laparoscopic hysterectomy, bilateral salpingectomy, TVT Exact midurethral sling, cystoscopy - POD number 21  Right PE  Left adnexal abscess.   Plan: Continue Ceftriaxone and Flagyl.  Awaiting ID consultation. CBC with diff and CMP in am.  Urine culture pending.  Oxy IR for pain prn.  Continue Eliquis bid.  Pepcid 10 mg q hs.   LOS: 1 day    Arloa Koh 10/02/2021, 6:15 PM

## 2021-10-02 NOTE — Progress Notes (Signed)
Status post total laparoscopic hysterectomy, bilateral salpingectomy, TVT Exact midurethral sling, cystoscopy - POD number 21, Right PE, Left adnexal abscess.  Subjective: Patient reports + flatus and no problems voiding.   Reports discomfort with urination.  Tolerating po.  Using IV pain medication for LLQ pain. Denies vaginal bleeding.   Objective: I have reviewed patient's vitals, intake and output, labs, and imaging.  General: alert and cooperative. No acute distress. Abdomen:  proturberant, soft tender LLQ with no guarding or rebound.  Incisions are clean, dry, and intact.   Actively passing flatus now. Vagina:  no bleeding noted.   CBC    Component Value Date/Time   WBC 9.5 10/02/2021 0425   RBC 5.08 10/02/2021 0425   HGB 11.6 (L) 10/02/2021 0425   HGB 13.6 12/23/2020 1528   HCT 37.5 10/02/2021 0425   HCT 43.5 12/23/2020 1528   PLT 416 (H) 10/02/2021 0425   PLT 375 12/23/2020 1528   MCV 73.8 (L) 10/02/2021 0425   MCV 73 (L) 12/23/2020 1528   MCH 22.8 (L) 10/02/2021 0425   MCHC 30.9 10/02/2021 0425   RDW 16.1 (H) 10/02/2021 0425   RDW 14.8 12/23/2020 1528   LYMPHSABS 2.2 10/01/2021 1313   LYMPHSABS 1.4 12/30/2018 1648   MONOABS 0.9 10/01/2021 1313   EOSABS 0.7 (H) 10/01/2021 1313   EOSABS 0.2 12/30/2018 1648   BASOSABS 0.0 10/01/2021 1313   BASOSABS 0.0 12/30/2018 1648   Glucose 114, albumin 3.4, and ALT 53.   UC pending.   SARS Negative.   Assessment: Status post total laparoscopic hysterectomy, bilateral salpingectomy, TVT Exact midurethral sling, cystoscopy - POD number 21 Right PE.  On Eliquis.  Left adnexal abscess.  On Ceftriaxone and Flagyl IV.  Plan: Continue IV Abx.  Continue Eliquis.  Interventional radiology consultation for possible drainage of left adnexal abscess.  Oral and IV pain medication available. Care plan reviewed with patient.   I explained to the patient that I have been unable to see her during her admissions post surgery until  this time due to a family medical emergency.   LOS: 1 day    Mell Mellott Matthew Saras 10/02/2021, 9:18 AM

## 2021-10-02 NOTE — Progress Notes (Signed)
IR received request to evaluate patient for possible left adnexal fluid collection aspiration with possible drain placement. Dr. Anselm Pancoast reviewed the imaging - the collection seen on CT is not drainable via percutaneous intervention. No IR procedure planned and the order will be deleted. Dr. Quincy Simmonds notified via epic chat.   Brianna Gutierrez, [place] [phone] 10/02/2021, 10:50 AM

## 2021-10-02 NOTE — Telephone Encounter (Signed)
Transition Care Management Unsuccessful Follow-up Telephone Call  Date of discharge and from where:  09/29/2021, [facility]  - patient has been readmitted to the hospital.

## 2021-10-02 NOTE — Progress Notes (Signed)
0250 Wasted 0.3 Mg of Dilaudid with Nurse Bishnu -Given 0.2mg  IV dilaudid  -MAR documented 0.5mg ,this nurse unable to edit before administration.

## 2021-10-03 ENCOUNTER — Inpatient Hospital Stay: Payer: Self-pay

## 2021-10-03 ENCOUNTER — Encounter: Payer: Self-pay | Admitting: Obstetrics and Gynecology

## 2021-10-03 DIAGNOSIS — T8143XA Infection following a procedure, organ and space surgical site, initial encounter: Secondary | ICD-10-CM

## 2021-10-03 DIAGNOSIS — K651 Peritoneal abscess: Secondary | ICD-10-CM

## 2021-10-03 DIAGNOSIS — Z5181 Encounter for therapeutic drug level monitoring: Secondary | ICD-10-CM | POA: Diagnosis not present

## 2021-10-03 LAB — COMPREHENSIVE METABOLIC PANEL
ALT: 41 U/L (ref 0–44)
AST: 19 U/L (ref 15–41)
Albumin: 3.3 g/dL — ABNORMAL LOW (ref 3.5–5.0)
Alkaline Phosphatase: 103 U/L (ref 38–126)
Anion gap: 8 (ref 5–15)
BUN: 14 mg/dL (ref 6–20)
CO2: 24 mmol/L (ref 22–32)
Calcium: 8.9 mg/dL (ref 8.9–10.3)
Chloride: 105 mmol/L (ref 98–111)
Creatinine, Ser: 0.67 mg/dL (ref 0.44–1.00)
GFR, Estimated: >60 mL/min (ref >60)
Glucose, Bld: 97 mg/dL (ref 70–99)
Potassium: 4 mmol/L (ref 3.5–5.1)
Sodium: 137 mmol/L (ref 135–145)
Total Bilirubin: 0.3 mg/dL (ref 0.3–1.2)
Total Protein: 7.5 g/dL (ref 6.5–8.1)

## 2021-10-03 LAB — CBC WITH DIFFERENTIAL/PLATELET
Abs Immature Granulocytes: 0.03 10*3/uL (ref 0.00–0.07)
Basophils Absolute: 0 10*3/uL (ref 0.0–0.1)
Basophils Relative: 0 %
Eosinophils Absolute: 0.8 10*3/uL — ABNORMAL HIGH (ref 0.0–0.5)
Eosinophils Relative: 8 %
HCT: 35.1 % — ABNORMAL LOW (ref 36.0–46.0)
Hemoglobin: 10.9 g/dL — ABNORMAL LOW (ref 12.0–15.0)
Immature Granulocytes: 0 %
Lymphocytes Relative: 16 %
Lymphs Abs: 1.7 10*3/uL (ref 0.7–4.0)
MCH: 22.8 pg — ABNORMAL LOW (ref 26.0–34.0)
MCHC: 31.1 g/dL (ref 30.0–36.0)
MCV: 73.4 fL — ABNORMAL LOW (ref 80.0–100.0)
Monocytes Absolute: 1 10*3/uL (ref 0.1–1.0)
Monocytes Relative: 10 %
Neutro Abs: 6.8 10*3/uL (ref 1.7–7.7)
Neutrophils Relative %: 66 %
Platelets: 395 10*3/uL (ref 150–400)
RBC: 4.78 MIL/uL (ref 3.87–5.11)
RDW: 15.7 % — ABNORMAL HIGH (ref 11.5–15.5)
WBC: 10.3 10*3/uL (ref 4.0–10.5)
nRBC: 0 % (ref 0.0–0.2)

## 2021-10-03 LAB — URINE CULTURE: Culture: NO GROWTH

## 2021-10-03 MED ORDER — CHLORHEXIDINE GLUCONATE CLOTH 2 % EX PADS
6.0000 | MEDICATED_PAD | Freq: Every day | CUTANEOUS | Status: DC
  Administered 2021-10-03: 6 via TOPICAL

## 2021-10-03 MED ORDER — SODIUM CHLORIDE 0.9% FLUSH: 10.0000 mL | INTRAVENOUS | Status: DC | PRN

## 2021-10-03 MED ORDER — METRONIDAZOLE 500 MG PO TABS
500.0000 mg | ORAL_TABLET | Freq: Two times a day (BID) | ORAL | Status: DC
  Administered 2021-10-03 – 2021-10-04 (×2): 500 mg via ORAL
  Filled 2021-10-03 (×2): qty 1

## 2021-10-03 MED ORDER — METRONIDAZOLE 500 MG PO TABS: 500.0000 mg | ORAL_TABLET | Freq: Two times a day (BID) | ORAL | 0 refills | Status: DC

## 2021-10-03 MED ORDER — CEFTRIAXONE IV (FOR PTA / DISCHARGE USE ONLY): 2.0000 g | INTRAVENOUS | 0 refills | Status: AC

## 2021-10-03 NOTE — Progress Notes (Signed)
Peripherally Inserted Central Catheter Placement  The IV Nurse has discussed with the patient and/or persons authorized to consent for the patient, the purpose of this procedure and the potential benefits and risks involved with this procedure.  The benefits include less needle sticks, lab draws from the catheter, and the patient may be discharged home with the catheter. Risks include, but not limited to, infection, bleeding, blood clot (thrombus formation), and puncture of an artery; nerve damage and irregular heartbeat and possibility to perform a PICC exchange if needed/ordered by physician.  Alternatives to this procedure were also discussed.  Bard Power PICC patient education guide, fact sheet on infection prevention and patient information card has been provided to patient /or left at bedside.    PICC Placement Documentation  PICC Single Lumen 11/06/30 Right Basilic 36 cm 0 cm (Active)  Indication for Insertion or Continuance of Line Prolonged intravenous therapies 10/03/21 1830  Exposed Catheter (cm) 0 cm 10/03/21 1830  Site Assessment Clean;Dry;Intact 10/03/21 1830  Line Status Flushed;Saline locked;Blood return noted 10/03/21 1830  Dressing Type Transparent 10/03/21 1830  Dressing Status Clean;Dry;Intact 10/03/21 1830  Antimicrobial disc in place? Yes 10/03/21 1830  Dressing Intervention New dressing;Other (Comment) 10/03/21 1830  Dressing Change Due 10/10/21 10/03/21 1830       Brianna Gutierrez 10/03/2021, 6:51 PM

## 2021-10-03 NOTE — Progress Notes (Addendum)
PHARMACY CONSULT NOTE FOR:  OUTPATIENT  PARENTERAL ANTIBIOTIC THERAPY (OPAT)  Indication: Post-op intra-abdominal abscess  Regimen: Ceftriaxone 2 gm IV Q 24 hours + Metronidazole 500 mg PO BID  End date: 10/17/21  IV antibiotic discharge orders are pended. To discharging provider:  please sign these orders via discharge navigator,  Select New Orders & click on the button choice - Manage This Unsigned Work.     Thank you for allowing pharmacy to be a part of this patient's care.  Jimmy Footman, PharmD, BCPS, BCIDP Infectious Diseases Clinical Pharmacist Phone: [phone] 10/03/2021, 3:22 PM

## 2021-10-03 NOTE — Consult Note (Signed)
[facility]                                                                                        Patient Identification: Patient Name: Brianna Gutierrez MRN: [id number] [place] Date: 10/01/2021 12:36 PM Today's Date: 10/03/2021 Reason for consult: Postop intra-abdominal abscess Requesting provider: Salvadore Dom  Principal Problem:   Post-operative infection   Antibiotics: Ceftriaxone 12/4-current                    Metronidazole 12/4-current  Lines/Hardware:  Assessment # Complex Post Op Pelvic Fluid collection ( vs abscess, 4.4cm ) including left adnexa, 4.2cm  ( no positive cultures ) - s/p Total laparoscopic hysterectomy with bilateral salpingectomy, lysis of umbilical adhesions, TVT Exact midurethral sling, cystoscopy 09/11/21 - No plans for surgical or IR intervention  - urine cx 12/4 no growth, no blood cx sent   # Left renal stone, 2.61mm   # Ill defined hypodensity in left liver lobe- non emergent MRI of liver after current issues resolved per Radiology to confirm benignity   Recommendations  Continue ceftriaxone and metronidazole PO- plan for 2 weeks. End date 12/20 PICC ordered Follow up around end of 2 weeks-repeat CT abd/pelvis at that time A follow up with [facility] has been made  ID pharmacy to place OPAT orders ID will sign off.  Discussed with Primary   Diagnosis: Post op pelvic abscess  Culture Result: None  Allergies  Allergen Reactions   Tramadol Other (See Comments)    Headache    OPAT Orders Discharge antibiotics to be given via PICC line Discharge antibiotics: ceftriaxone 2 g IV daily Per pharmacy protocol  Aim for Vancomycin trough 15-20 or AUC 400-550 (unless otherwise indicated) Duration: 2 weeks  End Date: 10/17/21  [facility] Per Protocol:  Home health RN for IV administration and teaching; PICC line care and labs.    Labs weekly while  on IV antibiotics: X__ CBC with differential __ BMP X__ CMP X__ CRP X__ ESR __ Vancomycin trough __ CK  __ Please pull PIC at completion of IV antibiotics X__ Please leave PIC in place until doctor has seen patient or been notified  Fax weekly labs to [phone]  Clinic Follow Up Appt: 12/20   '@10'$ : 30 am    Rest of the management as per the primary team. Please call with questions or concerns.  Thank you for the consult  Rosiland Oz, MD Infectious Disease Physician [facility] [address] [place], [place] [address] Phone: [phone]  Fax: [phone]  __________________________________________________________________________________________________________ HPI and Hospital Course: 45 year old female with PMH of hypertension, prediabetes, obesity, anxiety and depression, status post hysterectomy and bilateral salpingectomy,  lysis of umbilical adhesions, TVT Exact midurethral sling, cystoscopy on 09/11/2021 by Dr. Quincy Simmonds who presented to the ED on 12/4 with increasing abdominal pain.  Recently admitted 11/27-12/2 with CT abdomen pelvis showing slightly complex free fluid in the surgical bed and left adnexa, possible residual blood products versus developing abscess.  Repeat CT abdomen pelvis with acute PE and complex multiloculated collection in  the pelvis concerning for developing abscess/ovoid hypodensity in the left hemipelvis adjacent to the collection which may reflect left ovary surrounded by abscess/indeterminate lesion in the left hepatic lobe.  She was treated by IV Zosyn with improvement in her pain and normalization of her WBC count/afebrile.  She was discharged on 12/2 on Augmentin.  She was doing well but the following day she developed severe pain in her left lower quadrant and presented to the ED.  Denies fevers, nausea, vomiting or diarrhea.  BMs regular.  She lives by herself, has 4 adult children.  Denies being sexually active. Partner is incarcerated. Used to smoke 1 pack of cigarette over 4-5 days. Denies alcohol and IVDU.  At ED afebrile, no leukocytosis CT abdomen pelvis 12/4 findings as below IR was consulted for possible aspiration versus drain placement-not drainable by percutaneous intervention per IR. No plans for surgical intervention per primary   ROS: General- Denies fever, chills, loss of appetite and loss of weight HEENT - Denies headache, blurry vision, neck pain, sinus pain Chest - Denies any chest pain, SOB or cough CVS- Denies any dizziness/lightheadedness, syncopal attacks, palpitations Abdomen- Denies any vomiting,  hematochezia and diarrhea, Nausea + , abdominal pain + Neuro - Denies any weakness, numbness, tingling sensation Psych - Denies any changes in mood irritability or depressive symptoms GU- Denies any burning, dysuria, hematuria or increased frequency of urination Skin - denies any rashes/lesions MSK - denies any joint pain/swelling or restricted ROM   Past Medical History:  Diagnosis Date   Abnormal Pap smear of cervix    Anxiety    Depression    Dysmenorrhea    Eczema    GERD (gastroesophageal reflux disease)    History of COVID-19 10/24/2020   loss of taste and smellbody aches and cough x 4 days all symptoms resolved   Hypertension    Pre-diabetes    Rash 08/31/2021   comes and goes on legs saw dermetology no rash now ? food allergy   SUI (stress urinary incontinence, female)    Trichomonas infection 02/2021   Uterine fibroid    Past Surgical History:  Procedure Laterality Date   BLADDER SUSPENSION N/A 09/11/2021   Procedure: TRANSVAGINAL TAPE (TVT) PROCEDURE;  Surgeon: Patton Salles, MD;  Location: [facility];  Service: Gynecology;  Laterality: N/A;   CESAREAN SECTION  1997   x 1   CHOLECYSTECTOMY N/A 01/14/2019   Procedure: LAPAROSCOPIC CHOLECYSTECTOMY WITH POSSIBLE  INTRAOPERATIVE CHOLANGIOGRAM;  Surgeon:  Darnell Level, MD;  Location: [facility];  Service: General;  Laterality: N/A;   CYSTOSCOPY N/A 09/11/2021   Procedure: Bluford Kaufmann;  Surgeon: Patton Salles, MD;  Location: [facility];  Service: Gynecology;  Laterality: N/A;   DILITATION & CURRETTAGE/HYSTROSCOPY WITH HYDROTHERMAL ABLATION N/A 06/22/2016   Procedure: DILATATION & CURETTAGE/HYSTEROSCOPY WITH HYDROTHERMAL ABLATION;  Surgeon: Brock Bad, MD;  Location: [facility];  Service: Gynecology;  Laterality: N/A;   TOTAL LAPAROSCOPIC HYSTERECTOMY WITH SALPINGECTOMY Bilateral 09/11/2021   Procedure: TOTAL LAPAROSCOPIC HYSTERECTOMY WITH SALPINGECTOMY;  Surgeon: Patton Salles, MD;  Location: [facility];  Service: Gynecology;  Laterality: Bilateral;   TUBAL LIGATION     09-28-2015     Scheduled Meds:  amLODipine  10 mg Oral QHS   apixaban  10 mg Oral BID   Followed by   Melene Muller ON 10/06/2021] apixaban  5 mg Oral BID   cetirizine  10 mg Oral QHS   docusate sodium  100 mg Oral BID   famotidine  10 mg Oral QHS   metFORMIN  500 mg Oral BID WC   nicotine  14 mg Transdermal Q24H   saccharomyces boulardii  250 mg Oral BID   sertraline  100 mg Oral QHS   Continuous Infusions:  sodium chloride Stopped (10/02/21 1221)   cefTRIAXone (ROCEPHIN)  IV 2 g (10/03/21 0528)   metronidazole 500 mg (10/03/21 0913)   PRN Meds:.acetaminophen, HYDROmorphone (DILAUDID) injection, menthol-cetylpyridinium, morphine injection, ondansetron **OR** ondansetron (ZOFRAN) IV, oxyCODONE, polyethylene glycol, simethicone  Allergies  Allergen Reactions   Tramadol Other (See Comments)    Headache   Social History   Socioeconomic History   Marital status: Married    Spouse name: Not on file   Number of children: Not on file   Years of education: Not on file   Highest education level: Not on file  Occupational History   Not on file  Tobacco Use   Smoking status: Every Day    Packs/day: 0.10    Years: 20.00     Pack years: 2.00    Types: Cigarettes   Smokeless tobacco: Never  Vaping Use   Vaping Use: Never used  Substance and Sexual Activity   Alcohol use: Not Currently   Drug use: Never   Sexual activity: Not Currently    Partners: Male    Birth control/protection: Surgical    Comment: [place] 09/11/21  Other Topics Concern   Not on file  Social History Narrative   Not on file   Social Determinants of Health   Financial Resource Strain: Not on file  Food Insecurity: Not on file  Transportation Needs: Not on file  Physical Activity: Not on file  Stress: Not on file  Social Connections: Not on file  Intimate Partner Violence: Not on file    Family History  Problem Relation Age of Onset   Hypertension Maternal Grandmother    Diabetes Maternal Grandfather    Colon cancer Neg Hx    Esophageal cancer Neg Hx    Rectal cancer Neg Hx    Stomach cancer Neg Hx     Vitals BP 136/86 (BP Location: Left Arm)   Pulse 86   Temp 98.5 F (36.9 C) (Oral)   Resp 18   LMP 08/18/2021 (Exact Date)   SpO2 97%    Physical Exam Constitutional: Lying in bed, not in acute distress    Comments:   Cardiovascular:     Rate and Rhythm: Normal rate and regular rhythm.     Heart sounds:   Pulmonary:     Effort: Pulmonary effort is normal.     Comments: Clear lung sounds bilaterally  Abdominal:     Palpations: Abdomen is soft.     Tenderness: Nontender, distended, bowel sounds sluggish  Musculoskeletal:        General: No swelling or tenderness.   Skin:    Comments: No lesions or rashes  Neurological:     General: Grossly nonfocal.  Awake alert and oriented  Psychiatric:        Mood and Affect: Mood normal.    Pertinent Microbiology Results for orders placed or performed during the hospital encounter of 10/01/21  Urine Culture     Status: None   Collection Time: 10/01/21  1:13 PM   Specimen: Urine, Clean Catch  Result Value Ref Range Status   Specimen Description   Final     URINE, CLEAN CATCH Performed at [facility], [place] [address] [place], [place] [address]    Special Requests   Final    NONE Performed at [facility], [place] [address], [place], [place] [address]    Culture   Final    NO GROWTH Performed at [facility], [place] [address], [place], [place] [address]    Report Status 10/03/2021 FINAL  Final  Resp Panel by RT-PCR (Flu A&B, Covid) Urine, Clean Catch     Status: None   Collection Time: 10/01/21  6:39 PM   Specimen: Urine, Clean Catch; Nasopharyngeal(NP) swabs in vial transport medium  Result Value Ref Range Status   SARS Coronavirus 2 by RT PCR NEGATIVE NEGATIVE Final    Comment: (NOTE) SARS-CoV-2 target nucleic acids are NOT DETECTED.  The SARS-CoV-2 RNA is generally detectable in upper respiratory specimens during the acute phase of infection. The lowest concentration of SARS-CoV-2 viral copies this assay can detect is 138 copies/mL. A negative result does not preclude SARS-Cov-2 infection and should not be used as the sole basis for treatment or other patient management decisions. A negative result may occur with  improper specimen collection/handling, submission of specimen other than nasopharyngeal swab, presence of viral mutation(s) within the areas targeted by this assay, and inadequate number of viral copies(<138 copies/mL). A negative result must be combined with clinical observations, patient history, and epidemiological information. The expected result is Negative.  Fact Sheet for Patients:  EntrepreneurPulse.com.au  Fact Sheet for Healthcare Providers:  IncredibleEmployment.be  This test is no t yet approved or cleared by the [place] FDA and  has been authorized for detection and/or diagnosis of SARS-CoV-2 by FDA under an Emergency Use Authorization (EUA). This EUA will remain  in effect (meaning this test can be used) for the duration  of the COVID-19 declaration under Section 564(b)(1) of the Act, 21 U.S.C.section 360bbb-3(b)(1), unless the authorization is terminated  or revoked sooner.       Influenza A by PCR NEGATIVE NEGATIVE Final   Influenza B by PCR NEGATIVE NEGATIVE Final    Comment: (NOTE) The Xpert Xpress SARS-CoV-2/FLU/RSV plus assay is intended as an aid in the diagnosis of influenza from Nasopharyngeal swab specimens and should not be used as a sole basis for treatment. Nasal washings and aspirates are unacceptable for Xpert Xpress SARS-CoV-2/FLU/RSV testing.  Fact Sheet for Patients: EntrepreneurPulse.com.au  Fact Sheet for Healthcare Providers: IncredibleEmployment.be  This test is not yet approved or cleared by the [place] FDA and has been authorized for detection and/or diagnosis of SARS-CoV-2 by FDA under an Emergency Use Authorization (EUA). This EUA will remain in effect (meaning this test can be used) for the duration of the COVID-19 declaration under Section 564(b)(1) of the Act, 21 U.S.C. section 360bbb-3(b)(1), unless the authorization is terminated or revoked.  Performed at [facility], [place] [address] [place],  [address]     Pertinent Lab seen by me: - CBC Latest Ref Rng & Units 10/03/2021 10/02/2021 10/01/2021  WBC 4.0 - 10.5 K/uL 10.3 9.5 10.3  Hemoglobin 12.0 - 15.0 g/dL 10.9(L) 11.6(L) 12.5  Hematocrit 36.0 - 46.0 % 35.1(L) 37.5 40.5  Platelets 150 - 400 K/uL 395 416(H) 430(H)   CMP Latest Ref Rng & Units 10/03/2021 10/02/2021 10/01/2021  Glucose 70 - 99 mg/dL 97 114(H) 98  BUN 6 - 20 mg/dL $Remove'14 9 10  'iApWsQF$ Creatinine 0.44 - 1.00 mg/dL 0.67 0.72 0.72  Sodium 135 - 145 mmol/L 137 136 135  Potassium 3.5 - 5.1 mmol/L 4.0 3.9  4.5  Chloride 98 - 111 mmol/L 105 105 103  CO2 22 - 32 mmol/L $RemoveB'24 25 22  'ksXSpXPO$ Calcium 8.9 - 10.3 mg/dL 8.9 9.0 9.2  Total Protein 6.5 - 8.1 g/dL 7.5 7.7 8.7(H)  Total Bilirubin 0.3 - 1.2 mg/dL 0.3  0.3 0.3  Alkaline Phos 38 - 126 U/L 103 116 129(H)  AST 15 - 41 U/L 19 33 38  ALT 0 - 44 U/L 41 53(H) 56(H)     Pertinent Imagings/Other Imagings Plain films and CT images have been personally visualized and interpreted; radiology reports have been reviewed. Decision making incorporated into the Impression / Recommendations.  CT Abdomen Pelvis W Contrast  Result Date: 10/01/2021 CLINICAL DATA:  LEFT lower quadrant abdominal pain. Known pelvic abscess status post hysterectomy, worsening pain. EXAM: CT ABDOMEN AND PELVIS WITH CONTRAST TECHNIQUE: Multidetector CT imaging of the abdomen and pelvis was performed using the standard protocol following bolus administration of intravenous contrast. CONTRAST:  37mL OMNIPAQUE IOHEXOL 350 MG/ML SOLN COMPARISON:  CT abdomen dated 09/27/2021. FINDINGS: Lower chest: Mild bibasilar atelectasis. Hepatobiliary: Ill-defined hypodensity within the LEFT liver lobe, stable in the short-term interval at 1.8 cm, suspected hemangioma. No other focal abnormality is seen within the liver. Status post cholecystectomy. No bile duct dilatation seen. Pancreas: Unremarkable. No pancreatic ductal dilatation or surrounding inflammatory changes. Spleen: Normal in size without focal abnormality. Adrenals/Urinary Tract: Adrenal glands appear normal. 7 mm LEFT renal stone. RIGHT renal cortex scarring. Kidneys otherwise unremarkable without hydronephrosis. No ureteral or bladder calculi are identified. Bladder is decompressed. Stomach/Bowel: No dilated large or small bowel loops. No evidence of bowel wall inflammation. Appendix is normal. Stomach is unremarkable, partially decompressed. Vascular/Lymphatic: No vascular abnormality is identified. No enlarged lymph nodes are seen within the abdomen or pelvis. Reproductive: Surgical changes of recent hysterectomy. The portion of the previously described complex fluid collection in the LEFT adnexal region has enlarged, likely enveloping the LEFT  ovary with a combined thickness that now measures 4.2 cm (previously 2.3 cm). The additional multiloculated components of the complex fluid/phlegmon collection within the more inferior pelvis appear stable, the fluid-like component measuring up to 4.4 cm greatest dimension. Overall extent of the complex fluid collection is stable. No extension of the complex fluid collection to the RIGHT hemipelvis or into the lower abdomen. Other: No free intraperitoneal air. Musculoskeletal: Osseous structures of the abdomen and pelvis are unremarkable. Superficial soft tissues of the abdomen and pelvis are unremarkable. IMPRESSION: 1. Overall extent of the previously described complex fluid collection (presumed abscess) in the pelvis is stable, however, the portion in the LEFT adnexal region has slightly enlarged, likely enveloping the LEFT ovary with a combined thickness that now measures 4.2 cm (previously 2.3 cm). 2. 7 mm LEFT renal stone. No hydronephrosis. RIGHT renal cortex scarring. 3. Ill-defined hypodensity within the LEFT liver lobe, stable in the short-term interval at 1.8 cm, suspected hemangioma. Consider nonemergent MRI of the liver after current issues are resolved to confirm benignity. Aortic Atherosclerosis (ICD10-I70.0). Electronically Signed   By: Franki Cabot M.D.   On: 10/01/2021 15:32   CT ABDOMEN PELVIS W CONTRAST  Result Date: 09/27/2021 CLINICAL DATA:  Postop day fifth from hysterectomy with bilateral salpingectomy, elevated white count and pain, possible abscess seen on CT abdomen/pelvis 09/24/2021 EXAM: CT ABDOMEN AND PELVIS WITH CONTRAST TECHNIQUE: Multidetector CT imaging of the abdomen and pelvis was performed using the standard protocol following bolus administration of intravenous contrast. CONTRAST:  63mL OMNIPAQUE IOHEXOL 350 MG/ML SOLN COMPARISON:  CT abdomen/pelvis  09/24/2021 FINDINGS: Lower chest: Acute pulmonary emboli are seen in right lower lobe segmental and subsegmental arteries.  Linear opacities in the lung bases likely reflect atelectasis. There is flattening of the interventricular septum. Hepatobiliary: There is an ill-defined hypodense lesion in the left hepatic lobe measuring up to approximately 1.5 cm. No other focal lesions are seen. The gallbladder is surgically absent. There is no biliary ductal dilatation. Pancreas: Unremarkable. Spleen: Unremarkable. Adrenals/Urinary Tract: The adrenals are unremarkable. The right kidney is smaller than the left. The right kidney is slightly hypoenhancing compared to the left on the early phase images, but there is excretion of contrast into the collecting systems bilaterally on the delayed phase images. A hypodense lesion in the right upper pole most likely reflects a small cyst. There is a 5 mm nonobstructing left renal stone, unchanged. There are no other focal lesions or stones. There is no hydronephrosis or hydroureter. The bladder is decompressed and not well assessed. Stomach/Bowel: The stomach is unremarkable. There is no evidence of bowel obstruction. There is no abnormal bowel wall thickening or inflammatory change. Appendix is normal. Vascular/Lymphatic: The abdominal aorta is normal in course and caliber. The major branch vessels are patent. The main portal and splenic veins are patent. There is no abdominal or pelvic lymphadenopathy. Reproductive: The patient is status post hysterectomy and bilateral salpingectomy. The right ovary is identified and appears normal. There is a complex, multiloculated fluid collection in the surgical bed measuring up to approximally 4.8 cm AP by 4.1 cm TV by 4.6 cm cc. A rounded hypodensity adjacent to the superolateral aspect of this collection measuring approximally 2.2 cm may reflect the left ovary surrounded by the collection (2-73). A separate loculated portion of the collection measuring 2.4 cm x 1.7 cm by 2.0 cm is noted more posteriorly and inferiorly in the pelvis with incomplete peripheral  enhancement (6-75). This collection abuts the inferior margin of the sigmoid colon which may be adhered (6-59). Other: There is trace free fluid in the left hemipelvis adjacent to the above-described collection. There is no free intraperitoneal air. Musculoskeletal: There is no acute osseous abnormality or aggressive osseous lesion. IMPRESSION: 1. Acute pulmonary emboli in right lower lobe segmental and subsegmental arteries with flattening of the interventricular septum raising suspicion for a degree of right heart strain. 2. Complex multiloculated collection in the pelvis is concerning for developing abscess following recent hysterectomy. An ovoid hypodensity in the left hemipelvis adjacent to the collection may reflect the left ovary surrounded by abscess. 3. Unchanged indeterminate lesion in the left hepatic lobe. Nonemergent liver MRI with and without contrast is recommended for characterization. 4. Nonobstructing left renal stone. These results were called by telephone at the time of interpretation on 09/27/2021 at 3:45 pm to provider Dr Sumner Boast, who verbally acknowledged these results. Electronically Signed   By: Valetta Mole M.D.   On: 09/27/2021 15:46   CT Abdomen Pelvis W Contrast  Result Date: 09/24/2021 CLINICAL DATA:  Abdominal pain, fever, postop. Hysterectomy 1 week ago. EXAM: CT ABDOMEN AND PELVIS WITH CONTRAST TECHNIQUE: Multidetector CT imaging of the abdomen and pelvis was performed using the standard protocol following bolus administration of intravenous contrast. CONTRAST:  4mL OMNIPAQUE IOHEXOL 350 MG/ML SOLN COMPARISON:  None. FINDINGS: Lower chest: Strandy opacities are noted at the lung bases, possible atelectasis or infiltrate. Hepatobiliary: There is an ill-defined hypodensity in the left lobe of the liver hepatic segment 2 measuring 1.8 cm with indeterminate imaging characteristics. No biliary ductal dilatation. The gallbladder is  surgically absent Pancreas: Unremarkable. No  pancreatic ductal dilatation or surrounding inflammatory changes. Spleen: Normal in size without focal abnormality. Adrenals/Urinary Tract: No adrenal nodule or mass. Right renal atrophy and cortical scarring are noted. There is a delayed nephrogram on the right. A nonobstructive 7 mm calculus is present in the left kidney. No hydronephrosis is seen bilaterally. An extrarenal pelvis is noted on the right. The bladder is within normal limits. Stomach/Bowel: No bowel obstruction, free air or pneumatosis. A normal appendix is seen in the right lower quadrant the stomach is unremarkable. No focal bowel wall thickening. Vascular/Lymphatic: No significant vascular findings are present. No enlarged abdominal or pelvic lymph nodes. Reproductive: The uterus is surgically absent. There are slightly complex fluid collections in the surgical bed and left adnexa measuring 2.6 x 1.2 cm and 5.3 by 6.2. Small cystic structures are noted in the left ovary, likely representing follicles. Other: A fat containing anterior abdominal wall hernia is noted to the right of midline superior to the umbilicus. Musculoskeletal: No acute osseous abnormality. IMPRESSION: 1. Status post hysterectomy with slightly complex free fluid in the surgical bed and left adnexa, possible residual blood products versus developing abscess. 2. Nonobstructive left renal calculus. 3. Right renal atrophy with cortical scarring. 4. Indeterminate hypodensity in the left lobe of the liver. Multiphase CT or MRI suggested for further characterization on follow-up. 5. Strandy opacities at the lung bases, possible atelectasis or infiltrate. Electronically Signed   By: Brett Fairy M.D.   On: 09/24/2021 21:33   ECHOCARDIOGRAM COMPLETE  Result Date: 09/28/2021    ECHOCARDIOGRAM REPORT   Patient Name:   CHARMION HAPKE Date of Exam: 09/28/2021 Medical Rec #:  [id number]     Height:       62.0 in Accession #:    [id number]    Weight:       192.9 lb Date of Birth:  10-15-1976       BSA:          1.883 m Patient Age:    44 years      BP:           139/87 mmHg Patient Gender: F             HR:           86 bpm. Exam Location:  Inpatient Procedure: 2D Echo, Cardiac Doppler and Color Doppler Indications:    I26.02 Pulmonary embolus  History:        Patient has no prior history of Echocardiogram examinations.  Sonographer:    Merrie Roof RDCS Referring Phys: [id number] [place]  1. Left ventricular ejection fraction, by estimation, is 60 to 65%. The left ventricle has normal function. The left ventricle has no regional wall motion abnormalities. Left ventricular diastolic parameters were normal.  2. Right ventricular systolic function is normal. The right ventricular size is normal.  3. The mitral valve is normal in structure. No evidence of mitral valve regurgitation. No evidence of mitral stenosis.  4. The aortic valve is tricuspid. Aortic valve regurgitation is not visualized. No aortic stenosis is present. Comparison(s): No prior Echocardiogram. FINDINGS  Left Ventricle: Left ventricular ejection fraction, by estimation, is 60 to 65%. The left ventricle has normal function. The left ventricle has no regional wall motion abnormalities. The left ventricular internal cavity size was normal in size. There is  no left ventricular hypertrophy. Left ventricular diastolic parameters were normal. Right Ventricle: The right ventricular size is normal. Right ventricular systolic function  is normal. Left Atrium: Left atrial size was normal in size. Right Atrium: Right atrial size was normal in size. Pericardium: There is no evidence of pericardial effusion. Mitral Valve: The mitral valve is normal in structure. No evidence of mitral valve regurgitation. No evidence of mitral valve stenosis. Tricuspid Valve: The tricuspid valve is normal in structure. Tricuspid valve regurgitation is trivial. No evidence of tricuspid stenosis. Aortic Valve: The aortic valve is tricuspid. Aortic valve  regurgitation is not visualized. No aortic stenosis is present. Aortic valve mean gradient measures 8.0 mmHg. Aortic valve peak gradient measures 15.5 mmHg. Aortic valve area, by VTI measures 2.25  cm. Pulmonic Valve: The pulmonic valve was normal in structure. Pulmonic valve regurgitation is not visualized. No evidence of pulmonic stenosis. Aorta: The aortic root is normal in size and structure. Venous: The inferior vena cava was not well visualized. IAS/Shunts: The interatrial septum was not well visualized.  LEFT VENTRICLE PLAX 2D LVIDd:         4.60 cm   Diastology LVIDs:         3.00 cm   LV e' medial:    8.92 cm/s LV PW:         1.00 cm   LV E/e' medial:  10.5 LV IVS:        1.00 cm   LV e' lateral:   11.50 cm/s LVOT diam:     2.00 cm   LV E/e' lateral: 8.1 LV SV:         77 LV SV Index:   41 LVOT Area:     3.14 cm  RIGHT VENTRICLE RV Basal diam:  3.10 cm LEFT ATRIUM             Index        RIGHT ATRIUM           Index LA diam:        3.00 cm 1.59 cm/m   RA Area:     15.30 cm LA Vol (A2C):   40.8 ml 21.67 ml/m  RA Volume:   43.60 ml  23.16 ml/m LA Vol (A4C):   39.5 ml 20.98 ml/m LA Biplane Vol: 43.6 ml 23.16 ml/m  AORTIC VALVE AV Area (Vmax):    2.04 cm AV Area (Vmean):   2.04 cm AV Area (VTI):     2.25 cm AV Vmax:           197.00 cm/s AV Vmean:          132.000 cm/s AV VTI:            0.343 m AV Peak Grad:      15.5 mmHg AV Mean Grad:      8.0 mmHg LVOT Vmax:         128.00 cm/s LVOT Vmean:        85.700 cm/s LVOT VTI:          0.246 m LVOT/AV VTI ratio: 0.72  AORTA Ao Root diam: 3.00 cm MITRAL VALVE MV Area (PHT): 3.12 cm    SHUNTS MV Decel Time: 243 msec    Systemic VTI:  0.25 m MV E velocity: 93.40 cm/s  Systemic Diam: 2.00 cm MV A velocity: 93.40 cm/s MV E/A ratio:  1.00 Kirk Ruths MD Electronically signed by Kirk Ruths MD Signature Date/Time: 09/28/2021/6:27:26 PM    Final    VAS [place] LOWER EXTREMITY VENOUS (DVT)  Result Date: 09/28/2021  Lower Venous DVT Study Patient Name:   BILLY ROCCO  Date  of Exam:   09/28/2021 Medical Rec #: [id number]      Accession #:    [id number] Date of Birth: Oct 01, 1976       Patient Gender: F Patient Age:   48 years Exam [facility] Procedure:      VAS [place] LOWER EXTREMITY VENOUS (DVT) Referring Phys: Irene Pap --------------------------------------------------------------------------------  Indications: Pulmonary embolism.  Risk Factors: S/p partial hysterectomy x2 weeks. Comparison Study: No prior study Performing Technologist: Maudry Mayhew MHA, RDMS, RVT, RDCS  Examination Guidelines: A complete evaluation includes B-mode imaging, spectral Doppler, color Doppler, and power Doppler as needed of all accessible portions of each vessel. Bilateral testing is considered an integral part of a complete examination. Limited examinations for reoccurring indications may be performed as noted. The reflux portion of the exam is performed with the patient in reverse Trendelenburg.  +---------+---------------+---------+-----------+----------+--------------+ RIGHT    CompressibilityPhasicitySpontaneityPropertiesThrombus Aging +---------+---------------+---------+-----------+----------+--------------+ CFV      Full           Yes      Yes                                 +---------+---------------+---------+-----------+----------+--------------+ SFJ      Full                                                        +---------+---------------+---------+-----------+----------+--------------+ FV Prox  Full                                                        +---------+---------------+---------+-----------+----------+--------------+ FV Mid   Full                                                        +---------+---------------+---------+-----------+----------+--------------+ FV DistalFull                                                         +---------+---------------+---------+-----------+----------+--------------+ PFV      Full                                                        +---------+---------------+---------+-----------+----------+--------------+ POP      Full           Yes      Yes                                 +---------+---------------+---------+-----------+----------+--------------+ PTV      Full                                                        +---------+---------------+---------+-----------+----------+--------------+  PERO     Full                                                        +---------+---------------+---------+-----------+----------+--------------+   +---------+---------------+---------+-----------+----------+--------------+ LEFT     CompressibilityPhasicitySpontaneityPropertiesThrombus Aging +---------+---------------+---------+-----------+----------+--------------+ CFV      Full           Yes      Yes                                 +---------+---------------+---------+-----------+----------+--------------+ SFJ      Full                                                        +---------+---------------+---------+-----------+----------+--------------+ FV Prox  Full                                                        +---------+---------------+---------+-----------+----------+--------------+ FV Mid   Full                                                        +---------+---------------+---------+-----------+----------+--------------+ FV DistalFull                                                        +---------+---------------+---------+-----------+----------+--------------+ PFV      Full                                                        +---------+---------------+---------+-----------+----------+--------------+ POP      Full           Yes      Yes                                  +---------+---------------+---------+-----------+----------+--------------+ PTV      Full                                                        +---------+---------------+---------+-----------+----------+--------------+ PERO     Full                                                        +---------+---------------+---------+-----------+----------+--------------+  Summary: BILATERAL: - No evidence of deep vein thrombosis seen in the lower extremities, bilaterally. -No evidence of popliteal cyst, bilaterally.   *See table(s) above for measurements and observations. Electronically signed by Monica Martinez MD on 09/28/2021 at 5:23:51 PM.    Final     I spent more than 70  minutes for this patient encounter including review of prior medical records/discussing diagnostics and treatment plan with the patient/family/coordinate care with primary/other specialits with greater than 50% of time in face to face encounter.   Electronically signed by:   Rosiland Oz, MD Infectious Disease Physician [facility] Pager: [phone]

## 2021-10-03 NOTE — Progress Notes (Signed)
Status post total laparoscopic hysterectomy, bilateral salpingectomy, TVT Exact midurethral sling, cystoscopy - POD number 22, Right PE, Left adnexal abscess.    Subjective: Patient reports pain is improving overall.  Using Oxy IR and did need Dilaudid for breakthrough pain once last night.  Tolerating regular diet.  Ambulating.  Had a normal BM today, which was painful.  Voiding.  No vaginal bleeding or discharge.  Some epigastric discomfort.  Received Pepcid last night.   Objective: I have reviewed patient's vitals and labs.  Vitals:   10/02/21 2015 10/03/21 0512  BP: 119/73 111/66  Pulse: 94 93  Resp: 18 15  Temp: 98.8 F (37.1 C) 98.5 F (36.9 C)  SpO2: 96% 95%   CBC    Component Value Date/Time   WBC 10.3 10/03/2021 0431   RBC 4.78 10/03/2021 0431   HGB 10.9 (L) 10/03/2021 0431   HGB 13.6 12/23/2020 1528   HCT 35.1 (L) 10/03/2021 0431   HCT 43.5 12/23/2020 1528   PLT 395 10/03/2021 0431   PLT 375 12/23/2020 1528   MCV 73.4 (L) 10/03/2021 0431   MCV 73 (L) 12/23/2020 1528   MCH 22.8 (L) 10/03/2021 0431   MCHC 31.1 10/03/2021 0431   RDW 15.7 (H) 10/03/2021 0431   RDW 14.8 12/23/2020 1528   LYMPHSABS 1.7 10/03/2021 0431   LYMPHSABS 1.4 12/30/2018 1648   MONOABS 1.0 10/03/2021 0431   EOSABS 0.8 (H) 10/03/2021 0431   EOSABS 0.2 12/30/2018 1648   BASOSABS 0.0 10/03/2021 0431   BASOSABS 0.0 12/30/2018 1648   CMP:  albumin 3.3, otherwise normal.   Urine culture:  negative.  General: alert and cooperative Resp: clear to auscultation bilaterally Cardio: regular rate and rhythm, S1, S2 normal, no murmur, click, rub or gallop GI: soft, mildly tender in left lower quadrant, no guarding or rebound.  No masses.  Incisions:  clean, dry and intact.   Bimanual vaginal exam and rectal exam with chaperone Nurse Christy in the room. Vaginal cuff intact, slightly tender, no pointing masses, anterior vaginal wall suture appears to be loosening.  Incision palpates as intact  and no sling is palpable.   Rectal exam:  normal sphincter tone.  No masses. Nontender.    Assessment: Status post total laparoscopic hysterectomy, bilateral salpingectomy, TVT Exact midurethral sling, cystoscopy - POD number 22  Right PE.  On Eliquis.  Left adnexal abscess.  Not amenable to drainage transcutaneously by Interventional Radiology or by vaginal approach.  On Ceftriaxone and Flagyl after failed course of Zosyn and oral Augmentin.   Plan: Continue IV Ceftriaxone and Flagyl.  ID consult pending.  Will eventually need follow up imaging done.  Continue Oxy IR.  Continue Eliquis.    LOS: 2 days    Arloa Koh 10/03/2021, 10:56 AM

## 2021-10-04 LAB — C-REACTIVE PROTEIN: CRP: 3.5 mg/dL — ABNORMAL HIGH (ref <1.0)

## 2021-10-04 LAB — SEDIMENTATION RATE: Sed Rate: 51 mm/hr — ABNORMAL HIGH (ref 0–22)

## 2021-10-04 MED ORDER — SODIUM CHLORIDE 0.9 % IV SOLN: 2.0000 g | INTRAVENOUS | 0 refills | Status: DC

## 2021-10-04 MED ORDER — HEPARIN SOD (PORK) LOCK FLUSH 100 UNIT/ML IV SOLN
250.0000 [IU] | INTRAVENOUS | Status: AC | PRN
  Administered 2021-10-04: 250 [IU]
  Filled 2021-10-04: qty 2.5

## 2021-10-04 MED ORDER — ACETAMINOPHEN 500 MG PO TABS: 1000.0000 mg | ORAL_TABLET | Freq: Four times a day (QID) | ORAL | 0 refills | Status: DC | PRN

## 2021-10-04 NOTE — Progress Notes (Signed)
Status post total laparoscopic hysterectomy, bilateral salpingectomy, TVT Exact midurethral sling, cystoscopy - POD number 23, Right PE, Left adnexal abscess.  Subjective: Patient reports doing ok after receiving a PICC line yesterday evening.  Had ID consult who recommended taking Ceftriaxone and Flagyl for 14 days.   Home health care has been ordered.   Patient has prescriptions at home for Oxy IR and Eliquis.   Her sister lives close by and is a good support to her.   Objective: I have reviewed patient's vital signs and labs. Vitals:   10/04/21 0011 10/04/21 0607  BP: 126/86 131/79  Pulse: 93 81  Resp: 16 16  Temp: 98.7 F (37.1 C) 98.6 F (37 C)  SpO2: 95% 94%   CRP 3.5 Sec rate 51  General: alert and cooperative Resp: clear to auscultation bilaterally Cardio: regular rate and rhythm, S1, S2 normal, no murmur, click, rub or gallop GI: soft, non-tender; bowel sounds normal; no masses,  no organomegaly  Assessment:   Status post total laparoscopic hysterectomy, bilateral salpingectomy, TVT Exact midurethral sling, cystoscopy - POD number 23.  Right PE.  On Eliquis.  Pelvic abscess (Left adnexal abscess).  Not amenable to drainage transcutaneously by Interventional Radiology or by vaginal approach.  On Ceftriaxone and Flagyl after failed course of Zosyn and oral Augmentin.   Plan: Discharge home She will complete her PICC line education today.  She will continue her Oxy IR prn.  She was also instructed to take Tylenol prn.  She will continue her Eliquis.  FU with ID on 10/17/21.  ID will order her follow up imaging.  FU with GYN on 10/18/21.  She will call for worsening pain, fever, vaginal bleeding or any other concern.  Questions invited and answered.   LOS: 3 days    Arloa Koh 10/04/2021, 7:48 AM

## 2021-10-04 NOTE — Discharge Instructions (Signed)
Brianna Gutierrez,   Please call if you experience any significant pain, fever, problems with the PICC line, vaginal bleeding, or any other concern.   [name], MD

## 2021-10-04 NOTE — Progress Notes (Signed)
Pt alert and oriented. D/C instructions given, pt d/cd to home. 

## 2021-10-04 NOTE — Telephone Encounter (Signed)
Dr. Quincy Simmonds- I am not sure how you can send in to the main pharmacy electronically. I cannot set the pharmacy for the hospital only the [place].

## 2021-10-04 NOTE — TOC Transition Note (Signed)
Transition of Care [facility]) - CM/SW Discharge Note  Patient Details  Name: Brianna Gutierrez MRN: [id number] Date of Birth: 1975-11-11  [facility] CM/SW Contact:  Sherie Don, LCSW Phone Number: 10/04/2021, 11:37 AM  Clinical Narrative: Patient will need HHRN and IV antibiotics. CSW referred patient to [facility] with [facility]. Pam set up patient with [facility] through [place]. CSW updated patient regarding services being set up. OPAT orders are signed, PICC has been placed, and IV antibiotics training has been completed by Pam. TOC signing off.  Final next level of care: Home w Home Health Services Barriers to Discharge: Barriers Resolved  Patient Goals and CMS Choice CMS Medicare.gov Compare Post Acute Care list provided to:: Patient Choice offered to / list presented to : Patient  Discharge Plan and Services         DME Arranged: N/A DME Agency: NA HH Arranged: IV Antibiotics, RN [place] Agency: Other - See comment, [facility] ([facility] has set up patient with [facility] through [facility]) Date [place]: 10/04/21 Representative spoke with at [place]: Osborne Casco)  Readmission Risk Interventions No flowsheet data found.

## 2021-10-05 ENCOUNTER — Telehealth: Payer: Self-pay

## 2021-10-05 DIAGNOSIS — N739 Female pelvic inflammatory disease, unspecified: Secondary | ICD-10-CM | POA: Diagnosis not present

## 2021-10-05 DIAGNOSIS — Z90710 Acquired absence of both cervix and uterus: Secondary | ICD-10-CM | POA: Diagnosis not present

## 2021-10-05 DIAGNOSIS — T8140XA Infection following a procedure, unspecified, initial encounter: Secondary | ICD-10-CM | POA: Diagnosis not present

## 2021-10-05 NOTE — Telephone Encounter (Signed)
Transition Care Management Follow-up Telephone Call Date of discharge and from where: 10/04/2021, [facility]  How have you been since you were released from the hospital? She said she was feeling a little better. Any questions or concerns? No  Items Reviewed: Did the pt receive and understand the discharge instructions provided? Yes  Medications obtained and verified? Yes  - she said she has all of her medications,no questions about the med regime at this time.  Other? No  Any new allergies since your discharge? No  Dietary orders reviewed? No Do you have support at home? Yes   Home Care and Equipment/Supplies: Were home health services ordered? yes If so, what is the name of the agency? [facility]  Has the agency set up a time to come to the patient's home? Yes - the nurse was with the patient at the time of this call. This was her first time administering her IV med at home. The home health nurse will be drawing labs.  Were any new equipment or medical supplies ordered?  Yes: IV antibiotic, administration supplies and PICC dressing supplies What is the name of the medical supply agency? [facility]  Were you able to get the supplies/equipment? yes Do you have any questions related to the use of the equipment or supplies? No  Functional Questionnaire: (I = Independent and D = Dependent) ADLs: independent   Follow up appointments reviewed:  PCP Hospital f/u appt confirmed? Yes  Scheduled to see Durene Fruits, NP on 10/24/2021. [facility] f/u appt confirmed? Yes  Scheduled to see [facility] - 10/17/2021; GYN- 10/18/2021.  Are transportation arrangements needed? No  If their condition worsens, is the pt aware to call PCP or go to the Emergency Dept.? Yes Was the patient provided with contact information for the PCP's office or ED? Yes Was to pt encouraged to call back with questions or concerns? Yes

## 2021-10-06 ENCOUNTER — Telehealth: Payer: Self-pay | Admitting: Family

## 2021-10-06 DIAGNOSIS — T8140XA Infection following a procedure, unspecified, initial encounter: Secondary | ICD-10-CM | POA: Diagnosis not present

## 2021-10-06 DIAGNOSIS — Z90710 Acquired absence of both cervix and uterus: Secondary | ICD-10-CM | POA: Diagnosis not present

## 2021-10-06 DIAGNOSIS — N739 Female pelvic inflammatory disease, unspecified: Secondary | ICD-10-CM | POA: Diagnosis not present

## 2021-10-06 NOTE — Telephone Encounter (Signed)
PT CALLING REQUESTING CALL FROM PCP REGARDING A NEW REFERRAL FOR GYNECOLOGY NOT [facility] DUE TO A BAD EXPERIENCE. AND WOULD REALLY LIKE TO SPEAK W/ AM[facility] ABOUT THIS.   CONFIRMED COMING APPT.

## 2021-10-07 DIAGNOSIS — N739 Female pelvic inflammatory disease, unspecified: Secondary | ICD-10-CM | POA: Diagnosis not present

## 2021-10-07 DIAGNOSIS — T8140XA Infection following a procedure, unspecified, initial encounter: Secondary | ICD-10-CM | POA: Diagnosis not present

## 2021-10-07 DIAGNOSIS — Z90710 Acquired absence of both cervix and uterus: Secondary | ICD-10-CM | POA: Diagnosis not present

## 2021-10-07 NOTE — Discharge Summary (Signed)
Physician Discharge Summary  Patient ID: Brianna Gutierrez MRN: [id number] DOB/AGE: 01-14-1976 45 y.o.  Admit date: 10/01/2021 Discharge date: 10/04/21  Admission Diagnoses: Status post total laparoscopic hysterectomy, bilateral salpingectomy, TVT Exact midurethral sling, cystoscopy - POD number 20.  Complex post op fluid collection most consistent with pelvic abscess, left adnexal abscess.  Status post prior inpatient IV Zosyn followed by Augmentin po. LLQ pain. Right pulmonary embolus.  On Eliquis. Left renal stone, 7 mm. Hypodensity of the left liver lobe, suspected hemangioma.  Discharge Diagnoses:  Status post total laparoscopic hysterectomy, bilateral salpingectomy, TVT Exact midurethral sling, cystoscopy - POD number 23.  Complex post op fluid collection most consistent with pelvic abscess, left adnexal abscess.   Treated with IV Ceftriaxone and IV Flagyl LLQ pain. Right pulmonary embolus.  On Eliquis. Left renal stone, 7 mm. Hypodensity of the left liver lobe, suspected hemangioma.  Consider nonemergent liver MRI to confirm benignity.  Status post right arm PICC line placement 10/03/21.  Principal Problem:   Post-operative infection Active Problems:   Postoperative intra-abdominal abscess   Medication monitoring encounter   Discharged Condition:  Good.  Improved.  Hospital Course:  The patient is a 45 year old African American female, status post total laparoscopic hysterectomy, bilateral salpingectomy, TVT Exact midurethral sling, and cystoscopy on 09/11/21, who was readmitted 10/01/21 with LLQ pain.    She had been discharged from the hospital where she remained from 09/24/21 until 09/29/21 for treatment of a suspected left adnexal post op abscess and a subsequent asymptomatic right pulmonary embolus.  She was treated with IV Zosyn for the abscess and IV heparin followed by Eliquis for the pulmonary embolus which was noted on a CT scan performed in follow up on the abscess during  that hospital stay.   Her bilateral doppler ultrasound was negative for DVT and a 2D cardiac echo showed no intracardiac thrombus at that time. She was improved with her treatment and was discharged to home on Augmentin.   The patient developed severe pain in the left lower quadrant and presented for hospital re-evaluation on 10/01/21.  She had no vaginal bleeding.  A CT of the abdomen and pelvis documented an increase in the size of the fluid collection around the left adnexa.  She was afebrile and her WBC was 10.3.  Her hemoglobin was 12.5.  She was started on IV Ceftriaxone and Flagyl and treated with Dilaudid and Oxy IR for pain control.  Interventional radiology was consulted upon admission and then the following day regarding potential percutaneous drainage, which was deemed not possible.  The patient also had pelvic exams done by the gynecology team on two occasions, and there was no pointing abscess vaginally that would permit transvaginal drainage of the abscess formation.  The Infectious Disease team was consulted and they recommended the patient receive a PICC line and complete a two week course of IV Ceftriaxone and oral Flagyl.  She received the PICC line on 10/03/21 and a referral was made for home health care.  Her pain improved during her hospitalization.  She was able to tolerate a regular diet and had normal bowel function.  She had no significant fever during her hospital stay.  Her Eliquis course was continued throughout her hospitalization.  She was found to be in good and improved condition on 10/04/21, the day of discharge.  Consults: Interventional Radiology on 10/01/21 and 10/02/21 who determined that the abscess could not be drained percutaneously.  Infectious disease on 12//22 who recommended a PICC line  and a 2 week course of IV Cetriaxone and oral Flagyl.    Significant Diagnostic Studies:   Narrative & Impression  CLINICAL DATA:  LEFT lower quadrant abdominal pain. Known  pelvic abscess status post hysterectomy, worsening pain.   EXAM: CT ABDOMEN AND PELVIS WITH CONTRAST   TECHNIQUE: Multidetector CT imaging of the abdomen and pelvis was performed using the standard protocol following bolus administration of intravenous contrast.   CONTRAST:  7m OMNIPAQUE IOHEXOL 350 MG/ML SOLN   COMPARISON:  CT abdomen dated 09/27/2021.   FINDINGS: Lower chest: Mild bibasilar atelectasis.   Hepatobiliary: Ill-defined hypodensity within the LEFT liver lobe, stable in the short-term interval at 1.8 cm, suspected hemangioma. No other focal abnormality is seen within the liver. Status post cholecystectomy. No bile duct dilatation seen.   Pancreas: Unremarkable. No pancreatic ductal dilatation or surrounding inflammatory changes.   Spleen: Normal in size without focal abnormality.   Adrenals/Urinary Tract: Adrenal glands appear normal. 7 mm LEFT renal stone. RIGHT renal cortex scarring. Kidneys otherwise unremarkable without hydronephrosis. No ureteral or bladder calculi are identified. Bladder is decompressed.   Stomach/Bowel: No dilated large or small bowel loops. No evidence of bowel wall inflammation. Appendix is normal. Stomach is unremarkable, partially decompressed.   Vascular/Lymphatic: No vascular abnormality is identified. No enlarged lymph nodes are seen within the abdomen or pelvis.   Reproductive: Surgical changes of recent hysterectomy.   The portion of the previously described complex fluid collection in the LEFT adnexal region has enlarged, likely enveloping the LEFT ovary with a combined thickness that now measures 4.2 cm (previously 2.3 cm).   The additional multiloculated components of the complex fluid/phlegmon collection within the more inferior pelvis appear stable, the fluid-like component measuring up to 4.4 cm greatest dimension.   Overall extent of the complex fluid collection is stable. No extension of the complex fluid  collection to the RIGHT hemipelvis or into the lower abdomen.   Other: No free intraperitoneal air.   Musculoskeletal: Osseous structures of the abdomen and pelvis are unremarkable. Superficial soft tissues of the abdomen and pelvis are unremarkable.   IMPRESSION: 1. Overall extent of the previously described complex fluid collection (presumed abscess) in the pelvis is stable, however, the portion in the LEFT adnexal region has slightly enlarged, likely enveloping the LEFT ovary with a combined thickness that now measures 4.2 cm (previously 2.3 cm). 2. 7 mm LEFT renal stone. No hydronephrosis. RIGHT renal cortex scarring. 3. Ill-defined hypodensity within the LEFT liver lobe, stable in the short-term interval at 1.8 cm, suspected hemangioma. Consider nonemergent MRI of the liver after current issues are resolved to confirm benignity.   Aortic Atherosclerosis (ICD10-I70.0).     Electronically Signed   By: SFranki CabotM.D.   On: 10/01/2021 15:32     CBC    Component Value Date/Time   WBC 10.3 10/03/2021 0431   RBC 4.78 10/03/2021 0431   HGB 10.9 (L) 10/03/2021 0431   HGB 13.6 12/23/2020 1528   HCT 35.1 (L) 10/03/2021 0431   HCT 43.5 12/23/2020 1528   PLT 395 10/03/2021 0431   PLT 375 12/23/2020 1528   MCV 73.4 (L) 10/03/2021 0431   MCV 73 (L) 12/23/2020 1528   MCH 22.8 (L) 10/03/2021 0431   MCHC 31.1 10/03/2021 0431   RDW 15.7 (H) 10/03/2021 0431   RDW 14.8 12/23/2020 1528   LYMPHSABS 1.7 10/03/2021 0431   LYMPHSABS 1.4 12/30/2018 1648   MONOABS 1.0 10/03/2021 0431   EOSABS 0.8 (H) 10/03/2021 0431  EOSABS 0.2 12/30/2018 1648   BASOSABS 0.0 10/03/2021 0431   BASOSABS 0.0 12/30/2018 1648    Urine culture 10/01/21: no growth.  Treatments:  IV Ceftriaxone  and IV Flagyl.  Her Eliquis was continued for treatment of her pulmonary embolus.   Discharge Exam: Blood pressure 131/79, pulse 81, temperature 98.6 F (37 C), temperature source Oral, resp. rate 16, last  menstrual period 08/18/2021, SpO2 94 %. General: alert and cooperative Resp: clear to auscultation bilaterally Cardio: regular rate and rhythm, S1, S2 normal, no murmur, click, rub or gallop GI: soft, non-tender; bowel sounds normal; no masses,  no organomegaly    Disposition: Discharge disposition: 01-Home or Self Care       Discharge Instructions     [facility] pharmacist to adjust dose for Vancomycin, Aminoglycosides and other anti-infective therapies as requested by physician.   Complete by: As directed    [facility] to provide Cath Flo 29m   Complete by: As directed    Administer for PICC line occlusion and as ordered by physician for other access device issues.   Anaphylaxis Kit: Provided to treat any anaphylactic reaction to the medication being provided to the patient if First Dose or when requested by physician   Complete by: As directed    Epinephrine 1930mml vial / amp: Administer 0.30m430m0.30ml17mubcutaneously once for moderate to severe anaphylaxis, nurse to call physician and pharmacy when reaction occurs and call 911 if needed for immediate care   Diphenhydramine 50mg30mIV vial: Administer 25-50mg 62mM PRN for first dose reaction, rash, itching, mild reaction, nurse to call physician and pharmacy when reaction occurs   Sodium Chloride 0.9% NS 500ml I55mdminister if needed for hypovolemic blood pressure drop or as ordered by physician after call to physician with anaphylactic reaction   Change dressing on IV access line weekly and PRN   Complete by: As directed    Discharge patient   Complete by: As directed    Discharge disposition: 01-Home or Self Care   Discharge patient date: 10/04/2021   Flush IV access with Sodium Chloride 0.9% and Heparin 10 units/ml or 100 units/ml   Complete by: As directed    Home infusion instructions - [facility]   Complete by: As directed    Instructions: Flush IV access with Sodium Chloride 0.9% and  Heparin 10units/ml or 100units/ml   Change dressing on IV access line: Weekly and PRN   Instructions Cath Flo 2mg: Ad54mister for PICC Line occlusion and as ordered by physician for other access device   [facility] pharmacist to adjust dose for: Vancomycin, Aminoglycosides and other anti-infective therapies as requested by physician   Method of administration may be changed at the discretion of home infusion pharmacist based upon assessment of the patient and/or caregiver's ability to self-administer the medication ordered   Complete by: As directed       Allergies as of 10/04/2021       Reactions   Tramadol Other (See Comments)   Headache        Medication List     STOP taking these medications    amoxicillin-clavulanate 875-125 MG tablet Commonly known as: AUGMENTIN       TAKE these medications    acetaminophen 500 MG tablet Commonly known as: TYLENOL Take 2 tablets (1,000 mg total) by mouth every 6 (six) hours as needed for mild pain.   amLODipine 10 MG tablet Commonly known as: NORVASC Take 1 tablet (10 mg total)  by mouth daily. What changed: when to take this   Apixaban Starter Pack (24m and 538m Commonly known as: ELIQUIS STARTER PACK Take as directed on package: start with two-14m82mablets twice daily for 7 days. On day 8, switch to one-14mg66mblet twice daily.   apixaban 5 MG Tabs tablet Commonly known as: ELIQUIS Take 1 tablet (5 mg total) by mouth 2 (two) times daily. Start taking on: October 30, 2021   betamethasone dipropionate 0.05 % cream Apply 1 application topically daily as needed (itching/ allergic reaction).   cefTRIAXone  IVPB Commonly known as: ROCEPHIN Inject 2 g into the vein daily for 14 days. Indication:  Post-op intra-abdominal abscess  First Dose: Yes Last Day of Therapy:  10/17/21 Labs - Once weekly:  CBC/D and BMP, Labs - Every other week:  ESR and CRP Method of administration: IV Push Method of administration may be  changed at the discretion of home infusion pharmacist based upon assessment of the patient and/or caregiver's ability to self-administer the medication ordered.   cefTRIAXone 2 g in sodium chloride 0.9 % 100 mL Inject 2 g into the vein daily.   GAS-X MAXIMUM STRENGTH PO Take 1 tablet by mouth at bedtime as needed (gas/ bloating).   levocetirizine 5 MG tablet Commonly known as: XYZAL Take 5 mg by mouth at bedtime.   metFORMIN 500 MG tablet Commonly known as: GLUCOPHAGE Take 1 tablet (500 mg total) by mouth 2 (two) times daily with a meal.   metroNIDAZOLE 500 MG tablet Commonly known as: FLAGYL Take 1 tablet (500 mg total) by mouth every 12 (twelve) hours. Stop date 10/17/21   nicotine 14 mg/24hr patch Commonly known as: NICODERM CQ - dosed in mg/24 hours Place 1 patch (14 mg total) onto the skin daily.   polyethylene glycol 17 g packet Commonly known as: MiraLax Take 17 g by mouth daily as needed.   prenatal multivitamin Tabs tablet Take 1 tablet by mouth daily at 12 noon.   saccharomyces boulardii 250 MG capsule Commonly known as: FLORASTOR Take 1 capsule (250 mg total) by mouth 2 (two) times daily for 14 days.   sertraline 100 MG tablet Commonly known as: Zoloft Take 1 tablet (100 mg total) by mouth daily. What changed: when to take this       ASK your doctor about these medications    oxyCODONE 5 MG immediate release tablet Commonly known as: Oxy IR/ROXICODONE Take 1 tablet (5 mg total) by mouth every 8 (eight) hours as needed for up to 5 days for breakthrough pain or moderate pain. Ask about: Should I take this medication?               Discharge Care Instructions  (From admission, onward)           Start     Ordered   10/03/21 0000  Change dressing on IV access line weekly and PRN  (Home infusion instructions - [facility] )        10/03/21 1835          The patient was instructed that she may continue her previous prescription  for Oxy IR at home as previously prescribed above.   Follow-up Information     [facility] Follow up.   Why: IV antibiotics. [facility] provide nursing.              The patient will follow up with Infectious Disease on 10/17/21, with Gynecology on 10/18/21, and with her PCP on 10/24/21.  She will call for increasing pain, fever, nausea, vomiting, difficulty with breathing or any other concern.   Signed: Arloa Koh 10/07/2021, 4:36 PM

## 2021-10-08 ENCOUNTER — Other Ambulatory Visit: Payer: Self-pay | Admitting: Family

## 2021-10-08 DIAGNOSIS — Z90710 Acquired absence of both cervix and uterus: Secondary | ICD-10-CM

## 2021-10-08 DIAGNOSIS — N739 Female pelvic inflammatory disease, unspecified: Secondary | ICD-10-CM | POA: Diagnosis not present

## 2021-10-08 DIAGNOSIS — T8140XA Infection following a procedure, unspecified, initial encounter: Secondary | ICD-10-CM | POA: Diagnosis not present

## 2021-10-08 NOTE — Telephone Encounter (Signed)
Please call patient with update. A new referral placed to Gynecology. As requested the new referral indicates the patient does not prefer [facility]. The new Gynecology office should call patient within 2 weeks with appointment details.

## 2021-10-09 DIAGNOSIS — N739 Female pelvic inflammatory disease, unspecified: Secondary | ICD-10-CM | POA: Diagnosis not present

## 2021-10-09 DIAGNOSIS — T8140XA Infection following a procedure, unspecified, initial encounter: Secondary | ICD-10-CM | POA: Diagnosis not present

## 2021-10-09 DIAGNOSIS — Z90710 Acquired absence of both cervix and uterus: Secondary | ICD-10-CM | POA: Diagnosis not present

## 2021-10-10 ENCOUNTER — Encounter (HOSPITAL_COMMUNITY): Payer: Self-pay

## 2021-10-10 ENCOUNTER — Emergency Department (HOSPITAL_COMMUNITY): Payer: BC Managed Care – PPO

## 2021-10-10 ENCOUNTER — Other Ambulatory Visit: Payer: Self-pay

## 2021-10-10 ENCOUNTER — Telehealth: Payer: Self-pay

## 2021-10-10 ENCOUNTER — Emergency Department (HOSPITAL_COMMUNITY)
Admission: EM | Admit: 2021-10-10 | Discharge: 2021-10-10 | Disposition: A | Discharge status: Home / Self Care | Payer: BC Managed Care – PPO | Attending: Emergency Medicine | Admitting: Emergency Medicine

## 2021-10-10 DIAGNOSIS — I10 Essential (primary) hypertension: Secondary | ICD-10-CM | POA: Insufficient documentation

## 2021-10-10 DIAGNOSIS — R7303 Prediabetes: Secondary | ICD-10-CM | POA: Diagnosis not present

## 2021-10-10 DIAGNOSIS — N739 Female pelvic inflammatory disease, unspecified: Secondary | ICD-10-CM | POA: Diagnosis not present

## 2021-10-10 DIAGNOSIS — Z4800 Encounter for change or removal of nonsurgical wound dressing: Secondary | ICD-10-CM | POA: Insufficient documentation

## 2021-10-10 DIAGNOSIS — F17210 Nicotine dependence, cigarettes, uncomplicated: Secondary | ICD-10-CM | POA: Insufficient documentation

## 2021-10-10 DIAGNOSIS — J9811 Atelectasis: Secondary | ICD-10-CM | POA: Diagnosis not present

## 2021-10-10 DIAGNOSIS — Z8616 Personal history of COVID-19: Secondary | ICD-10-CM | POA: Insufficient documentation

## 2021-10-10 DIAGNOSIS — Z7984 Long term (current) use of oral hypoglycemic drugs: Secondary | ICD-10-CM | POA: Insufficient documentation

## 2021-10-10 DIAGNOSIS — Z7901 Long term (current) use of anticoagulants: Secondary | ICD-10-CM | POA: Diagnosis not present

## 2021-10-10 DIAGNOSIS — L299 Pruritus, unspecified: Secondary | ICD-10-CM | POA: Diagnosis not present

## 2021-10-10 DIAGNOSIS — Z79899 Other long term (current) drug therapy: Secondary | ICD-10-CM | POA: Insufficient documentation

## 2021-10-10 DIAGNOSIS — Z90710 Acquired absence of both cervix and uterus: Secondary | ICD-10-CM | POA: Diagnosis not present

## 2021-10-10 DIAGNOSIS — T8140XA Infection following a procedure, unspecified, initial encounter: Secondary | ICD-10-CM | POA: Diagnosis not present

## 2021-10-10 DIAGNOSIS — Z4801 Encounter for change or removal of surgical wound dressing: Secondary | ICD-10-CM | POA: Diagnosis not present

## 2021-10-10 MED ORDER — DIPHENHYDRAMINE HCL 25 MG PO CAPS
25.0000 mg | ORAL_CAPSULE | Freq: Once | ORAL | Status: AC
  Administered 2021-10-10: 25 mg via ORAL
  Filled 2021-10-10: qty 1

## 2021-10-10 NOTE — Telephone Encounter (Signed)
Patient called reporting PICC line was bothering her - pain, swelling and bruised. Patient stated that she had EMT come out to check it last night and they told her it looked fine. RN recommended patient go to the ED due to the swelling and pain to be sure PICC was in correct placement and to possibly check for blood clots. Patient verbalized her understanding.    Oliver, CMA

## 2021-10-10 NOTE — Discharge Instructions (Signed)
You can use Benadryl 25 mg every 6 hours as needed for itching around PICC site.  Try to avoid scratching the area.  If you have issues or questions regarding your PICC line, nurses come to hospital and ask for the IV team.

## 2021-10-10 NOTE — ED Provider Notes (Addendum)
Emergency Medicine Provider Triage Evaluation Note  Brianna Gutierrez , a 45 y.o. female  was evaluated in triage.  Pt complains of right arm pain, swelling 2/2 PICC line issue. Patient reports it has been pulling blood, and abx are able to flow through line. Denies fever / chills / pus. Reports pain with arm movement -- thinks it may have been irritated after dressing changed yesterday. Receiving IV abx, heparin for abscess above bladder. Line has been in since last Wednesday.  Review of Systems  Positive: Picc line issue Negative: Fever, chills  Physical Exam  BP (!) 112/100 (BP Location: Left Arm)    Pulse (!) 104    Temp 98.4 F (36.9 C) (Oral)    Resp 18    Ht 5\' 2"  (1.575 m)    Wt 90.3 kg    LMP 08/18/2021 (Exact Date)    SpO2 97%    BMI 36.40 kg/m  Gen:   Awake, no distress   Resp:  Normal effort  MSK:   Moves extremities without difficulty  Other:  Some TTP around picc without redness, purulence  Medical Decision Making  Medically screening exam initiated at 12:51 PM.  Appropriate orders placed.  Paxton Cabler was informed that the remainder of the evaluation will be completed by another provider, this initial triage assessment does not replace that evaluation, and the importance of remaining in the ED until their evaluation is complete.  Picc issue   Anselmo Pickler, PA-C 10/10/21 1253    Dorien Chihuahua 10/10/21 1258    Milton Ferguson, MD 10/10/21 1606

## 2021-10-10 NOTE — ED Provider Notes (Signed)
[facility] DEPT Provider Note   CSN: [id number] Arrival date & time: 10/10/21  1158     History Chief Complaint  Patient presents with   Vascular Access Problem    Brianna Gutierrez is a 45 y.o. female.  Brianna Gutierrez is a 45 y.o. female with a history of hypertension, PE, intra-abdominal abscess, prediabetes, depression, anxiety, who presents to the ED for PICC line issue.  She had PICC line placed last Wednesday during hospital admission for continued outpatient antibiotics and heparin for intra-abdominal abscess and PE.  Patient reports that she infused her antibiotic yesterday and then had a dressing change.  After the dressing change she noticed some itching around the PICC site.  Patient reports that she has had a lot of itching and has been scratching in this area.  Has some arm pain only with movement in certain areas.  No fevers.  Was able to give the antibiotics and heparin yesterday without issue.  The history is provided by the patient and medical records.      Past Medical History:  Diagnosis Date   Abnormal Pap smear of cervix    Anxiety    Depression    Dysmenorrhea    Eczema    GERD (gastroesophageal reflux disease)    History of COVID-19 10/24/2020   loss of taste and smellbody aches and cough x 4 days all symptoms resolved   Hypertension    Pre-diabetes    Rash 08/31/2021   comes and goes on legs saw dermetology no rash now ? food allergy   SUI (stress urinary incontinence, female)    Trichomonas infection 02/2021   Uterine fibroid     Patient Active Problem List   Diagnosis Date Noted   Postoperative intra-abdominal abscess    Medication monitoring encounter    Post-operative infection 10/01/2021   Pelvic infection in female 09/24/2021   Status post laparoscopic hysterectomy 09/11/2021   Cholelithiasis 01/13/2019   Pre-diabetes 11/15/2016   History of bilateral tubal ligation 09/28/2015   Tobacco abuse 09/28/2015     Past Surgical History:  Procedure Laterality Date   BLADDER SUSPENSION N/A 09/11/2021   Procedure: TRANSVAGINAL TAPE (TVT) PROCEDURE;  Surgeon: [name], MD;  Location: [facility];  Service: Gynecology;  Laterality: N/A;   CESAREAN SECTION  1997   x 1   CHOLECYSTECTOMY N/A 01/14/2019   Procedure: LAPAROSCOPIC CHOLECYSTECTOMY WITH POSSIBLE  INTRAOPERATIVE CHOLANGIOGRAM;  Surgeon: Armandina Gemma, MD;  Location: [facility];  Service: General;  Laterality: N/A;   CYSTOSCOPY N/A 09/11/2021   Procedure: Consuela Mimes;  Surgeon: [name], MD;  Location: [facility];  Service: Gynecology;  Laterality: N/A;   DILITATION & CURRETTAGE/HYSTROSCOPY WITH HYDROTHERMAL ABLATION N/A 06/22/2016   Procedure: DILATATION & CURETTAGE/HYSTEROSCOPY WITH HYDROTHERMAL ABLATION;  Surgeon: Shelly Bombard, MD;  Location: [facility];  Service: Gynecology;  Laterality: N/A;   TOTAL LAPAROSCOPIC HYSTERECTOMY WITH SALPINGECTOMY Bilateral 09/11/2021   Procedure: TOTAL LAPAROSCOPIC HYSTERECTOMY WITH SALPINGECTOMY;  Surgeon: [name], MD;  Location: [facility];  Service: Gynecology;  Laterality: Bilateral;   TUBAL LIGATION     09-28-2015     OB History     Gravida  4   Para      Term      Preterm      AB      Living  4      SAB      IAB  Ectopic      Multiple      Live Births  4           Family History  Problem Relation Age of Onset   Hypertension Maternal Grandmother    Diabetes Maternal Grandfather    Colon cancer Neg Hx    Esophageal cancer Neg Hx    Rectal cancer Neg Hx    Stomach cancer Neg Hx     Social History   Tobacco Use   Smoking status: Every Day    Packs/day: 0.10    Years: 20.00    Pack years: 2.00    Types: Cigarettes   Smokeless tobacco: Never  Vaping Use   Vaping Use: Never used  Substance Use Topics   Alcohol use: Not Currently   Drug use: Never    Home  Medications Prior to Admission medications   Medication Sig Start Date End Date Taking? Authorizing Provider  acetaminophen (TYLENOL) 500 MG tablet Take 2 tablets (1,000 mg total) by mouth every 6 (six) hours as needed for mild pain. 10/04/21   [name], MD  amLODipine (NORVASC) 10 MG tablet Take 1 tablet (10 mg total) by mouth daily. Patient taking differently: Take 10 mg by mouth at bedtime. 07/31/21 10/29/21  Fenton Foy, NP  apixaban (ELIQUIS) 5 MG TABS tablet Take 1 tablet (5 mg total) by mouth 2 (two) times daily. 10/30/21   Kayleen Memos, DO  APIXABAN Arne [place]) VTE STARTER PACK (10MG AND 5MG) Take as directed on package: start with two-74m tablets twice daily for 7 days. On day 8, switch to one-535mtablet twice daily. 09/29/21   HaKayleen MemosDO  betamethasone dipropionate 0.05 % cream Apply 1 application topically daily as needed (itching/ allergic reaction). 09/24/21   [provider]  cefTRIAXone (ROCEPHIN) IVPB Inject 2 g into the vein daily for 14 days. Indication:  Post-op intra-abdominal abscess  First Dose: Yes Last Day of Therapy:  10/17/21 Labs - Once weekly:  CBC/D and BMP, Labs - Every other week:  ESR and CRP Method of administration: IV Push Method of administration may be changed at the discretion of home infusion pharmacist based upon assessment of the patient and/or caregiver's ability to self-administer the medication ordered. 10/03/21 10/17/21  AmNunzio CobbsMD  cefTRIAXone 2 g in sodium chloride 0.9 % 100 mL Inject 2 g into the vein daily. 10/05/21   AmNunzio CobbsMD  levocetirizine (XYZAL) 5 MG tablet Take 5 mg by mouth at bedtime. 06/09/21   [provider]  metFORMIN (GLUCOPHAGE) 500 MG tablet Take 1 tablet (500 mg total) by mouth 2 (two) times daily with a meal. 09/29/21 12/28/21  HaKayleen MemosDO  metroNIDAZOLE (FLAGYL) 500 MG tablet Take 1 tablet (500 mg total) by mouth every 12 (twelve) hours. Stop date  10/17/21 10/03/21   AmYisroel RammingBrEverardo AllMD  nicotine (NICODERM CQ - DOSED IN MG/24 HOURS) 14 mg/24hr patch Place 1 patch (14 mg total) onto the skin daily. 09/29/21 12/28/21  HaKayleen MemosDO  polyethylene glycol (MIRALAX) 17 g packet Take 17 g by mouth daily as needed. 09/29/21   HaKayleen MemosDO  Prenatal Vit-Fe Fumarate-FA (PRENATAL MULTIVITAMIN) TABS tablet Take 1 tablet by mouth daily at 12 noon. 09/30/21 12/29/21  HaKayleen MemosDO  saccharomyces boulardii (FLORASTOR) 250 MG capsule Take 1 capsule (250 mg total) by mouth 2 (two) times daily for 14 days.  09/29/21 10/13/21  Kayleen Memos, DO  sertraline (ZOLOFT) 100 MG tablet Take 1 tablet (100 mg total) by mouth daily. Patient taking differently: Take 100 mg by mouth at bedtime. 07/31/21 10/29/21  Fenton Foy, NP  Simethicone (GAS-X MAXIMUM STRENGTH PO) Take 1 tablet by mouth at bedtime as needed (gas/ bloating).    [provider]  dicyclomine (BENTYL) 20 MG tablet Take 1 tablet (20 mg total) by mouth 2 (two) times daily. 10/29/19 08/24/20  Ok Edwards, PA-C  omeprazole (PRILOSEC) 20 MG capsule Take 1 capsule (20 mg total) by mouth daily. 01/01/19 08/07/19  Scot Jun, FNP    Allergies    Tramadol  Review of Systems   Review of Systems  Constitutional:  Negative for chills and fever.  Musculoskeletal:  Positive for myalgias.  Skin:  Positive for color change.  Neurological:  Negative for weakness and numbness.  All other systems reviewed and are negative.  Physical Exam Updated Vital Signs BP (!) 112/100 (BP Location: Left Arm)    Pulse (!) 104    Temp 98.4 F (36.9 C) (Oral)    Resp 18    Ht _0  (1.575 m)    Wt 90.3 kg    LMP 08/18/2021 (Exact Date)    SpO2 97%    BMI 36.40 kg/m   Physical Exam Vitals and nursing note reviewed.  Constitutional:      General: She is not in acute distress.    Appearance: Normal appearance. She is well-developed. She is not ill-appearing or diaphoretic.  HENT:     Head:  Normocephalic and atraumatic.  Eyes:     General:        Right eye: No discharge.        Left eye: No discharge.  Pulmonary:     Effort: Pulmonary effort is normal. No respiratory distress.  Musculoskeletal:     Comments: PICC line in right upper arm with some redness and excoriations surrounding dressing, no purulence noted, no swelling or induration.  Distal pulses 2+.  Neurological:     Mental Status: She is alert and oriented to person, place, and time.     Coordination: Coordination normal.  Psychiatric:        Mood and Affect: Mood normal.        Behavior: Behavior normal.    ED Results / Procedures / Treatments   Labs (all labs ordered are listed, but only abnormal results are displayed) Labs Reviewed - No data to display  EKG None  Radiology DG Chest 2 View  Result Date: 10/10/2021 CLINICAL DATA:  picc line placement EXAM: CHEST - 2 VIEW COMPARISON:  11/04/2017. FINDINGS: Right upper extremity PICC with the tip projecting at the lower SVC. Linear opacities in bilateral lung bases, compatible subsegmental atelectasis. No confluent consolidation. No visible pleural effusions or pneumothorax. Cardiomediastinal silhouette is similar. Similar tortuous aorta. Cholecystectomy clips. Mild reverse S-shaped thoracolumbar curvature. IMPRESSION: 1. Right upper extremity PICC with the tip projecting at the lower SVC. 2. Bibasilar subsegmental atelectasis. Electronically Signed   By: Margaretha Sheffield M.D.   On: 10/10/2021 14:48    Procedures Procedures   Medications Ordered in ED Medications  diphenhydrAMINE (BENADRYL) capsule 25 mg (25 mg Oral Given 10/10/21 1552)    ED Course  I have reviewed the triage vital signs and the nursing notes.  Pertinent labs & imaging results that were available during my care of the patient were reviewed by me and considered in my medical decision  making (see chart for details).    MDM Rules/Calculators/A&P                           Patient  presents with concerns for redness and irritation around PICC line site, this is been in place since last Wednesday and she has been administering IV antibiotics and heparin at home.  She reports she lasted for infusion yesterday without any issues, but after dressing change noted that the area was itchy and a little bit red.  She has not had any associated facial swelling or generalized rash.  Has not had any fevers or chills.  Reports some mild arm discomfort with certain movements since the PICC line has been in but this not worsened or drainage.  No numbness or weakness in the arm.  PICC team at bedside, confirmed that PICC line is in appropriate place with tip projecting at the lower SVC.  She performed dressing change did not see any purulence or signs of infection, no signs of infiltration.  Patient admits that she has been having itching and scratching around the dressing site.  Encourage patient to use Benadryl as needed.  Information provided for calling with PICC team for any questions or concerns.  No signs of infection on exam.  Will discharge home.  Return precautions discussed.  Final Clinical Impression(s) / ED Diagnoses Final diagnoses:  Dressing change  Pruritus    Rx / DC Orders ED Discharge Orders     None        Janet Berlin 10/10/21 2019    Varney Biles, MD 10/10/21 2137

## 2021-10-10 NOTE — ED Triage Notes (Signed)
Patient states that after she infused an antibiotic yesterday via RUE PICC line her right upper arm is swollen and bruised. Patient also c/o right upper arm pain when moved a certain way.

## 2021-10-11 DIAGNOSIS — Z90710 Acquired absence of both cervix and uterus: Secondary | ICD-10-CM | POA: Diagnosis not present

## 2021-10-11 DIAGNOSIS — N739 Female pelvic inflammatory disease, unspecified: Secondary | ICD-10-CM | POA: Diagnosis not present

## 2021-10-11 DIAGNOSIS — T8140XA Infection following a procedure, unspecified, initial encounter: Secondary | ICD-10-CM | POA: Diagnosis not present

## 2021-10-12 DIAGNOSIS — T8140XA Infection following a procedure, unspecified, initial encounter: Secondary | ICD-10-CM | POA: Diagnosis not present

## 2021-10-12 DIAGNOSIS — Z90710 Acquired absence of both cervix and uterus: Secondary | ICD-10-CM | POA: Diagnosis not present

## 2021-10-12 DIAGNOSIS — N739 Female pelvic inflammatory disease, unspecified: Secondary | ICD-10-CM | POA: Diagnosis not present

## 2021-10-13 DIAGNOSIS — N739 Female pelvic inflammatory disease, unspecified: Secondary | ICD-10-CM | POA: Diagnosis not present

## 2021-10-13 DIAGNOSIS — Z90710 Acquired absence of both cervix and uterus: Secondary | ICD-10-CM | POA: Diagnosis not present

## 2021-10-13 DIAGNOSIS — T8140XA Infection following a procedure, unspecified, initial encounter: Secondary | ICD-10-CM | POA: Diagnosis not present

## 2021-10-14 DIAGNOSIS — T8140XA Infection following a procedure, unspecified, initial encounter: Secondary | ICD-10-CM | POA: Diagnosis not present

## 2021-10-14 DIAGNOSIS — N739 Female pelvic inflammatory disease, unspecified: Secondary | ICD-10-CM | POA: Diagnosis not present

## 2021-10-14 DIAGNOSIS — Z90710 Acquired absence of both cervix and uterus: Secondary | ICD-10-CM | POA: Diagnosis not present

## 2021-10-15 DIAGNOSIS — N739 Female pelvic inflammatory disease, unspecified: Secondary | ICD-10-CM | POA: Diagnosis not present

## 2021-10-15 DIAGNOSIS — Z90710 Acquired absence of both cervix and uterus: Secondary | ICD-10-CM | POA: Diagnosis not present

## 2021-10-15 DIAGNOSIS — T8140XA Infection following a procedure, unspecified, initial encounter: Secondary | ICD-10-CM | POA: Diagnosis not present

## 2021-10-16 DIAGNOSIS — Z90710 Acquired absence of both cervix and uterus: Secondary | ICD-10-CM | POA: Diagnosis not present

## 2021-10-16 DIAGNOSIS — N739 Female pelvic inflammatory disease, unspecified: Secondary | ICD-10-CM | POA: Diagnosis not present

## 2021-10-16 DIAGNOSIS — T8140XA Infection following a procedure, unspecified, initial encounter: Secondary | ICD-10-CM | POA: Diagnosis not present

## 2021-10-16 NOTE — Progress Notes (Signed)
TRANSITION OF CARE VISIT   Primary Care Provider (PCP):     [facility] [address] [place],  [place]  [address] Phone: [phone] Fax: [phone]   Date of Admission: 10/01/2021  Date of Discharge: 10/04/2021  Transitions of Care Call: 10/05/2021  Discharged from: [facility]   Discharge Diagnosis:  Status post total laparoscopic hysterectomy, bilateral salpingectomy, TVT Exact midurethral sling, cystoscopy - POD number 23.  Complex post op fluid collection most consistent with pelvic abscess, left adnexal abscess.   Treated with IV Ceftriaxone and IV Flagyl LLQ pain. Right pulmonary embolus.  On Eliquis. Left renal stone, 7 mm. Hypodensity of the left liver lobe, suspected hemangioma.  Consider nonemergent liver MRI to confirm benignity.  Status post right arm PICC line placement 10/03/21.   Summary of Admission 10/04/2021 - 10/05/2021 per MD note:  Hospital Course:  The patient is a 45 year old African American female, status post total laparoscopic hysterectomy, bilateral salpingectomy, TVT Exact midurethral sling, and cystoscopy on 09/11/21, who was readmitted 10/01/21 with LLQ pain.     She had been discharged from the hospital where she remained from 09/24/21 until 09/29/21 for treatment of a suspected left adnexal post op abscess and a subsequent asymptomatic right pulmonary embolus.  She was treated with IV Zosyn for the abscess and IV heparin followed by Eliquis for the pulmonary embolus which was noted on a CT scan performed in follow up on the abscess during that hospital stay.   Her bilateral doppler ultrasound was negative for DVT and a 2D cardiac echo showed no intracardiac thrombus at that time. She was improved with her treatment and was discharged to home on Augmentin.    The patient developed  severe pain in the left lower quadrant and presented for hospital re-evaluation on 10/01/21.  She had no vaginal bleeding.  A CT of the abdomen and pelvis documented an increase in the size of the fluid collection around the left adnexa.  She was afebrile and her WBC was 10.3.  Her hemoglobin was 12.5.  She was started on IV Ceftriaxone and Flagyl and treated with Dilaudid and Oxy IR for pain control.  Interventional radiology was consulted upon admission and then the following day regarding potential percutaneous drainage, which was deemed not possible.  The patient also had pelvic exams done by the gynecology team on two occasions, and there was no pointing abscess vaginally that would permit transvaginal drainage of the abscess formation.  The Infectious Disease team was consulted and they recommended the patient receive a PICC line and complete a two week course of IV Ceftriaxone and oral Flagyl.  She received the PICC line on 10/03/21 and a referral was made for home health care.  Her pain improved during her hospitalization.  She was able to tolerate a regular diet and had normal bowel function.  She had no significant fever during her hospital stay.  Her Eliquis course was continued throughout her hospitalization.  She was found to be in good and improved condition on 10/04/21, the day of discharge.   Consults: Interventional Radiology on 10/01/21 and 10/02/21 who determined that  the abscess could not be drained percutaneously.  Infectious disease on 12//22 who recommended a PICC line and a 2 week course of IV Cetriaxone and oral Flagyl.    Follow-Ups: Follow up with [facility]; IV antibiotics. Orville Govern will provide nursing. The patient will follow up with Infectious Disease on 10/17/21, with Gynecology on 10/18/21, and with her PCP on 10/24/21.   10/17/2021 at [facility] for Infectious Disease per MD note: # Complex Post Op Pelvic Fluid collection ( vs abscess, 4.4cm ) including left adnexa,  4.2cm  ( no positive cultures ) - s/p Total laparoscopic hysterectomy with bilateral salpingectomy, lysis of umbilical adhesions, TVT Exact midurethral sling, cystoscopy 09/11/21 - No surgical or IR intervention done - urine cx 12/4 no growth, no blood cx sent    # Medication Monitoring  12/12 CRP 13.8, ESR 82,  Cr 0.79   # Left renal stone, 2.79m   # Ill defined hypodensity in left liver lobe- non emergent MRI of liver after current issues resolved per Radiology to confirm benignity   Continue IV ceftriaxone and PO metronidazole as is. Extend both antibiotics for 10 days from 12/20 pending CT abdomen/pelvis. New end date 10/27/21 Repeat CT abdomen/pelvis w contrast Fu in a week after CT abdomen/pelvis is done.    Updated OPAT orders below Discharge antibiotics to be given via PICC line Discharge antibiotics: ceftriaxone 2 g IV daily Per pharmacy protocol  Duration: 10 days  End Date: 10/27/21   [facility]   Home health RN for IV administration and teaching; PICC line care and labs.     10/18/2021 with GMuirper MD note: She was told to reduce her Tylenol usage.  She can take 1000 mg every 6 hours as needed. Rx Oxy IR 5 mg, take one by mouth every 6 hours as needed for severe pain.  Disp:  10 tablets.  RF:  none.  She understands that this is a short term prescription, not intended for refills.   Use heating pad.  Was supposed to return 12/26 to work. Will need to extend one more month.  FU here in one month. She will keep her appointments for her CT scan, ID follow up and PCP follow up.   TODAY's VISIT 10/24/2021: Patient/Caregiver self-reported problems/concerns:  Since hospital discharge doing ok. Stomach pain has decreased.   During hospital admission was told to place oxygen on while sleeping because pulse ox was 88%. She does snore and has some shortness of breath with ambulation. No longer smoking and taking nicotine patch since  hospital admission/discharge, doing well no issues/concerns. Reports told will take Eliquis for 6 months also receiving a blood thinner through PICC line administered through home health nursing agency. Taking Metformin for pre-diabetes. Returning to work 11/23/2021.  Reports on yesterday PICC line dressing came off. She called the nursing agency who is assisting her with infusions and dressing changes and was told that because it was a holiday nobody was available to assist. Reports she told them she would change dressing herself at home. Reports earlier today was able to get in contact with the home health nursing agency and was told that primary care could change the dressing and collect any labs needed and they would not need to come out to her home as a result. Patient reports she declined primary care managing for safety measures. Reports home health nursing agency said they will be to see her today after 12 noon.  Since hospital  discharge anxiety increasing and not sleeping well. Still taking Sertraline as prescribed.  Scheduled for abdomen CT tomorrow and next Infectious Disease appointment later this week.  Scheduled for next Gynecology January 2023.   Depression screen [facility] 2/9 10/24/2021 03/07/2021 02/07/2021 01/23/2021 12/23/2020  Decreased Interest 0 0 0 0 0  Down, Depressed, Hopeless 0 0 0 0 0  PHQ - 2 Score 0 0 0 0 0  Altered sleeping - 0 0 0 -  Tired, decreased energy - 0 0 0 -  Change in appetite - 0 0 0 -  Feeling bad or failure about yourself  - 0 0 0 -  Trouble concentrating - 0 0 0 -  Moving slowly or fidgety/restless - 0 0 0 -  Suicidal thoughts - 0 0 0 -  PHQ-9 Score - 0 0 0 -  Difficult doing work/chores - Not difficult at all Not difficult at all Not difficult at all -     MEDICATIONS  Medication Reconciliation conducted with patient/caregiver? (Yes/ No): Yes   New medications prescribed/discontinued upon discharge? (Yes/No): Yes   Barriers identified related to  medications: No  LABS  Lab Reviewed (Yes/No/NA): Yes   PHYSICAL EXAM:  Physical Exam HENT:     Head: Normocephalic and atraumatic.  Eyes:     Extraocular Movements: Extraocular movements intact.     Conjunctiva/sclera: Conjunctivae normal.     Pupils: Pupils are equal, round, and reactive to light.  Cardiovascular:     Rate and Rhythm: Tachycardia present.  Pulmonary:     Effort: Pulmonary effort is normal.     Breath sounds: Normal breath sounds.  Musculoskeletal:     Cervical back: Normal range of motion and neck supple.     Comments: PICC line right upper arm clean, dry, intact without erythema or drainage.  Skin:    General: Skin is warm and dry.  Neurological:     General: No focal deficit present.     Mental Status: She is alert and oriented to person, place, and time.  Psychiatric:        Mood and Affect: Mood normal.        Behavior: Behavior normal.    ASSESSMENT & PLAN: 1. Hospital discharge follow-up: - Reviewed hospital course, current medications, ensured proper follow-up in place, and addressed concerns.   2. Postoperative infection, unspecified type, sequela: 3. Postoperative intra-abdominal abscess: 4. S/P hysterectomy: - Keep repeat CT abdomen/pelvis appointment on 10/25/2021. - Keep next scheduled appointment with Rosiland Oz, MD on 10/27/2021 at Infectious Disease.  - Keep next scheduled appointment with [name], MD on 11/16/2021 located at Gynecology.   5. Right pulmonary embolus ([place]): - Continue Apixaban as prescribed.  - Referral to Pulmonology for further evaluation and management.  - Ambulatory referral to Pulmonology  6. Hypodense mass of left lobe of liver: - Referral to Hepatology for further evaluation and management. - Amb Referral to Hepatology  7. Anxiety and depression: - Patient denies thoughts of self-harm, suicidal ideations, homicidal ideations. - Continue Sertraline as prescribed.  - Begin Hydroxyzine as prescribed.  Counseled on medication compliance and adverse effects.  - Follow-up with primary provider in 4 weeks or sooner if needed.  - hydrOXYzine (VISTARIL) 25 MG capsule; Take 1 capsule (25 mg total) by mouth every 8 (eight) hours as needed.  Dispense: 30 capsule; Refill: 0 - sertraline (ZOLOFT) 100 MG tablet; Take 1 tablet (100 mg total) by mouth at bedtime.  Dispense: 90 tablet; Refill: 0  8. Pre-diabetes: -  Continue Metformin as prescribed.  - Follow-up with primary provider as scheduled.   9. Encounter for smoking cessation counseling: - Continue Nicotine patch as prescribed.  - Follow-up with primary provider as scheduled.   10. Snoring: - Referral for Sleep Study. - [facility] Sleep Study; Future   PATIENT EDUCATION PROVIDED: See AVS   FOLLOW-UP (Include any further testing or referrals):  - Follow-up with primary provider in 4 weeks or sooner if needed for anxiety depression. - Keep all scheduled appointments with Gynecology. - Keep all scheduled appointments with Infectious Disease.  - Referral to Hepatology.  - Referral to Pulmonology. - Referral for Sleep Study.

## 2021-10-16 NOTE — Progress Notes (Signed)
GYNECOLOGY  VISIT   HPI: 45 y.o.   Married  [place] American  female   G4P0 with Patient's last menstrual period was 08/18/2021 (exact date).   here for 6 weeks post op  TOTAL LAPAROSCOPIC HYSTERECTOMY WITH SALPINGECTOMY (Bilateral: Abdomen)  CYSTOSCOPY (Bladder)  TRANSVAGINAL TAPE (TVT) PROCEDURE (Vagina )  Patient is being treated for left adnexal pelvic abscess and right pulmonary embolus following hysterectomy.   Feeling bilateral lower pelvic pain.  She states she is taking Tylenol 2000 mg every 3 hours.  She is on Eliquis for a right PE, so she is unable to take NSAIDs.  She is requesting pain medication.   She denies vaginal bleeding.   She stopped stool softeners due to loose BMs.  Bowel function is now regular.   Voiding well.  No dysuria.  No fevers.  She does report some shortness of breath.  Saw ID yesterday and will continue for 10 more days of IV Ceftriaxone and oral Flagyl. She will continue with her PICC line.  Has a CT scan on 10/25/21 and then follow up with ID on 10/27/21.   She will see her PCP on 10/24/21 for follow up of her PE and prediabetes being treated with Metformin.  GYNECOLOGIC HISTORY: Patient's last menstrual period was 08/18/2021 (exact date). Contraception:  Hyst Menopausal hormone therapy:  none Last mammogram:  09/05/21- neg birads 1 Last pap smear:   03-03-21 Neg:Neg HR HPV, 11-13-16 Neg:Neg HR HPV        OB History     Gravida  4   Para      Term      Preterm      AB      Living  4      SAB      IAB      Ectopic      Multiple      Live Births  4              Patient Active Problem List   Diagnosis Date Noted   Postoperative intra-abdominal abscess    Medication monitoring encounter    Post-operative infection 10/01/2021   Pelvic infection in female 09/24/2021   Status post laparoscopic hysterectomy 09/11/2021   Cholelithiasis 01/13/2019   Pre-diabetes 11/15/2016   History of bilateral tubal ligation  09/28/2015   Tobacco abuse 09/28/2015    Past Medical History:  Diagnosis Date   Abnormal Pap smear of cervix    Anxiety    Depression    Dysmenorrhea    Eczema    GERD (gastroesophageal reflux disease)    History of COVID-19 10/24/2020   loss of taste and smellbody aches and cough x 4 days all symptoms resolved   Hypertension    Pre-diabetes    Rash 08/31/2021   comes and goes on legs saw dermetology no rash now ? food allergy   SUI (stress urinary incontinence, female)    Trichomonas infection 02/2021   Uterine fibroid     Past Surgical History:  Procedure Laterality Date   BLADDER SUSPENSION N/A 09/11/2021   Procedure: TRANSVAGINAL TAPE (TVT) PROCEDURE;  Surgeon: [name], MD;  Location: [facility];  Service: Gynecology;  Laterality: N/A;   CESAREAN SECTION  1997   x 1   CHOLECYSTECTOMY N/A 01/14/2019   Procedure: LAPAROSCOPIC CHOLECYSTECTOMY WITH POSSIBLE  INTRAOPERATIVE CHOLANGIOGRAM;  Surgeon: Armandina Gemma, MD;  Location: [facility];  Service: General;  Laterality: N/A;   CYSTOSCOPY N/A 09/11/2021  Procedure: CYSTOSCOPY;  Surgeon: [name], MD;  Location: [facility];  Service: Gynecology;  Laterality: N/A;   DILITATION & CURRETTAGE/HYSTROSCOPY WITH HYDROTHERMAL ABLATION N/A 06/22/2016   Procedure: DILATATION & CURETTAGE/HYSTEROSCOPY WITH HYDROTHERMAL ABLATION;  Surgeon: Shelly Bombard, MD;  Location: [facility];  Service: Gynecology;  Laterality: N/A;   TOTAL LAPAROSCOPIC HYSTERECTOMY WITH SALPINGECTOMY Bilateral 09/11/2021   Procedure: TOTAL LAPAROSCOPIC HYSTERECTOMY WITH SALPINGECTOMY;  Surgeon: [name], MD;  Location: [facility];  Service: Gynecology;  Laterality: Bilateral;   TUBAL LIGATION     09-28-2015    Current Outpatient Medications  Medication Sig Dispense Refill   acetaminophen (TYLENOL) 500 MG tablet Take 2 tablets (1,000 mg total) by mouth every 6 (six) hours  as needed for mild pain. 30 tablet 0   amLODipine (NORVASC) 10 MG tablet Take 1 tablet (10 mg total) by mouth daily. (Patient taking differently: Take 10 mg by mouth at bedtime.) 90 tablet 0   [START ON 10/30/2021] apixaban (ELIQUIS) 5 MG TABS tablet Take 1 tablet (5 mg total) by mouth 2 (two) times daily. 60 tablet 0   APIXABAN (ELIQUIS) VTE STARTER PACK (10MG  AND 5MG ) Take as directed on package: start with two-5mg  tablets twice daily for 7 days. On day 8, switch to one-5mg  tablet twice daily. 1 each 0   betamethasone dipropionate 0.05 % cream Apply 1 application topically daily as needed (itching/ allergic reaction).     cefTRIAXone 2 g in sodium chloride 0.9 % 100 mL Inject 2 g into the vein daily. 14 Units 0   levocetirizine (XYZAL) 5 MG tablet Take 5 mg by mouth at bedtime.     metFORMIN (GLUCOPHAGE) 500 MG tablet Take 1 tablet (500 mg total) by mouth 2 (two) times daily with a meal. 180 tablet 0   metroNIDAZOLE (FLAGYL) 500 MG tablet Take 1 tablet (500 mg total) by mouth 2 (two) times daily for 10 days. Stop date 10/17/21 20 tablet 0   nicotine (NICODERM CQ - DOSED IN MG/24 HOURS) 14 mg/24hr patch Place 1 patch (14 mg total) onto the skin daily. 90 patch 0   polyethylene glycol (MIRALAX) 17 g packet Take 17 g by mouth daily as needed. (Patient not taking: Reported on 10/18/2021) 14 each 0   Prenatal Vit-Fe Fumarate-FA (PRENATAL MULTIVITAMIN) TABS tablet Take 1 tablet by mouth daily at 12 noon. 90 tablet 0   sertraline (ZOLOFT) 100 MG tablet Take 1 tablet (100 mg total) by mouth daily. (Patient taking differently: Take 100 mg by mouth at bedtime.) 90 tablet 0   Simethicone (GAS-X MAXIMUM STRENGTH PO) Take 1 tablet by mouth at bedtime as needed (gas/ bloating).     No current facility-administered medications for this visit.     ALLERGIES: Tramadol  Family History  Problem Relation Age of Onset   Hypertension Maternal Grandmother    Diabetes Maternal Grandfather    Colon cancer Neg Hx     Esophageal cancer Neg Hx    Rectal cancer Neg Hx    Stomach cancer Neg Hx     Social History   Socioeconomic History   Marital status: Married    Spouse name: Not on file   Number of children: Not on file   Years of education: Not on file   Highest education level: Not on file  Occupational History   Not on file  Tobacco Use   Smoking status: Every Day    Packs/day: 0.10  Years: 20.00    Pack years: 2.00    Types: Cigarettes   Smokeless tobacco: Never  Vaping Use   Vaping Use: Never used  Substance and Sexual Activity   Alcohol use: Not Currently   Drug use: Never   Sexual activity: Not Currently    Partners: Male    Birth control/protection: Surgical    Comment: [place] 09/11/21  Other Topics Concern   Not on file  Social History Narrative   Not on file   Social Determinants of Health   Financial Resource Strain: Not on file  Food Insecurity: Not on file  Transportation Needs: Not on file  Physical Activity: Not on file  Stress: Not on file  Social Connections: Not on file  Intimate Partner Violence: Not on file    Review of Systems  Genitourinary:  Positive for pelvic pain (low pelvic discomfort).  All other systems reviewed and are negative.  PHYSICAL EXAMINATION:    BP 130/80    Pulse (!) 112    Ht 5\' 2"  (1.575 m)    Wt 190 lb (86.2 kg)    LMP 08/18/2021 (Exact Date)    SpO2 97%    BMI 34.75 kg/m     General appearance: alert, cooperative and no acute distress.  Lungs: clear to auscultation bilaterally Heart: regular rate and rhythm Abdomen: Incisions are intact.  Active bowel sounds, soft, non-tender, no masses, no organomegaly   Pelvic: External genitalia: SP incision intact.              Urethra:  normal appearing urethra with no masses, tenderness or lesions              Bartholins and Skenes: normal                 Vagina: normal appearing vagina with normal color and discharge, no lesions.  Anterior vaginal wall incision is intact.  Sling is  protected.  No suture visible.               Cervix:  absent.  Vaginal cuff intact.  No erythema.  No blood.  No drainage.                 Bimanual Exam:  Uterus: absent.  No pelvic masses.  Non tender.              Adnexa: no mass, fullness, tenderness              Rectal exam: Yes.  .  Confirms.  No masses.               Anus:  normal sphincter tone, no lesions  Chaperone was present for exam:  Estill Bamberg, CMA.  ASSESSMENT  Status post laparoscopic hysterectomy with bilateral salpingectomy, TVT Exact midurethral sling, cystoscopy.  Post operative pelvic abscess.   PICC line right arm.  On Ceftriaxone IV and oral Flagyl.  Right PE.  On Eliquis.  Prediabetic.  On Metformin.   PLAN  She was told to reduce her Tylenol usage.  She can take 1000 mg every 6 hours as needed. Rx Oxy IR 5 mg, take one by mouth every 6 hours as needed for severe pain.  Disp:  10 tablets.  RF:  none.  She understands that this is a short term prescription, not intended for refills.   Use heating pad.  Was supposed to return 12/26 to work. Will need to extend one more month.  FU here in one month. She will keep  her appointments for her CT scan, ID follow up and PCP follow up.    An After Visit Summary was printed and given to the patient.

## 2021-10-17 ENCOUNTER — Encounter: Payer: Self-pay | Admitting: Infectious Diseases

## 2021-10-17 ENCOUNTER — Ambulatory Visit (INDEPENDENT_AMBULATORY_CARE_PROVIDER_SITE_OTHER): Payer: BC Managed Care – PPO | Admitting: Infectious Diseases

## 2021-10-17 ENCOUNTER — Other Ambulatory Visit: Payer: Self-pay

## 2021-10-17 ENCOUNTER — Telehealth: Payer: Self-pay

## 2021-10-17 VITALS — BP 131/90 | Temp 98.2°F | Ht 61.0 in | Wt 198.0 lb

## 2021-10-17 DIAGNOSIS — T8140XA Infection following a procedure, unspecified, initial encounter: Secondary | ICD-10-CM | POA: Diagnosis not present

## 2021-10-17 DIAGNOSIS — N739 Female pelvic inflammatory disease, unspecified: Secondary | ICD-10-CM | POA: Diagnosis not present

## 2021-10-17 DIAGNOSIS — T8140XD Infection following a procedure, unspecified, subsequent encounter: Secondary | ICD-10-CM | POA: Diagnosis not present

## 2021-10-17 DIAGNOSIS — Z90710 Acquired absence of both cervix and uterus: Secondary | ICD-10-CM | POA: Diagnosis not present

## 2021-10-17 MED ORDER — METRONIDAZOLE 500 MG PO TABS: 500.0000 mg | ORAL_TABLET | Freq: Two times a day (BID) | ORAL | 0 refills | Status: DC

## 2021-10-17 NOTE — Telephone Encounter (Signed)
I spoke to R[facility] with [facility] and gave verbal orders to extend IV antibiotics until 12/30 per Dr. West Bali. Patient has a follow up with our office on 12/30 as well and Dr. West Bali will determine if picc line can be removed.   Dr. West Bali also requested that metronidazole 500 mg BID x 10 days be sent to the pharmacy for the patient and this has also been sent. Elfrieda Espino T Brooks Sailors

## 2021-10-17 NOTE — Progress Notes (Signed)
Patient Active Problem List   Diagnosis Date Noted   Postoperative intra-abdominal abscess    Medication monitoring encounter    Post-operative infection 10/01/2021   Pelvic infection in female 09/24/2021   Status post laparoscopic hysterectomy 09/11/2021   Cholelithiasis 01/13/2019   Pre-diabetes 11/15/2016   History of bilateral tubal ligation 09/28/2015   Tobacco abuse 09/28/2015    Patient's Medications  New Prescriptions   No medications on file  Previous Medications   ACETAMINOPHEN (TYLENOL) 500 MG TABLET    Take 2 tablets (1,000 mg total) by mouth every 6 (six) hours as needed for mild pain.   AMLODIPINE (NORVASC) 10 MG TABLET    Take 1 tablet (10 mg total) by mouth daily.   APIXABAN (ELIQUIS) 5 MG TABS TABLET    Take 1 tablet (5 mg total) by mouth 2 (two) times daily.   APIXABAN (ELIQUIS) VTE STARTER PACK (10MG AND 5MG)    Take as directed on package: start with two-42m tablets twice daily for 7 days. On day 8, switch to one-539mtablet twice daily.   BETAMETHASONE DIPROPIONATE 0.05 % CREAM    Apply 1 application topically daily as needed (itching/ allergic reaction).   CEFTRIAXONE (ROCEPHIN) IVPB    Inject 2 g into the vein daily for 14 days. Indication:  Post-op intra-abdominal abscess  First Dose: Yes Last Day of Therapy:  10/17/21 Labs - Once weekly:  CBC/D and BMP, Labs - Every other week:  ESR and CRP Method of administration: IV Push Method of administration may be changed at the discretion of home infusion pharmacist based upon assessment of the patient and/or caregiver's ability to self-administer the medication ordered.   CEFTRIAXONE 2 G IN SODIUM CHLORIDE 0.9 % 100 ML    Inject 2 g into the vein daily.   LEVOCETIRIZINE (XYZAL) 5 MG TABLET    Take 5 mg by mouth at bedtime.   METFORMIN (GLUCOPHAGE) 500 MG TABLET    Take 1 tablet (500 mg total) by mouth 2 (two) times daily with a meal.   METRONIDAZOLE (FLAGYL) 500 MG TABLET    Take 1 tablet (500 mg total) by  mouth every 12 (twelve) hours. Stop date 10/17/21   NICOTINE (NICODERM CQ - DOSED IN MG/24 HOURS) 14 MG/24HR PATCH    Place 1 patch (14 mg total) onto the skin daily.   POLYETHYLENE GLYCOL (MIRALAX) 17 G PACKET    Take 17 g by mouth daily as needed.   PRENATAL VIT-FE FUMARATE-FA (PRENATAL MULTIVITAMIN) TABS TABLET    Take 1 tablet by mouth daily at 12 noon.   SERTRALINE (ZOLOFT) 100 MG TABLET    Take 1 tablet (100 mg total) by mouth daily.   SIMETHICONE (GAS-X MAXIMUM STRENGTH PO)    Take 1 tablet by mouth at bedtime as needed (gas/ bloating).  Modified Medications   No medications on file  Discontinued Medications   No medications on file    Subjective: Here for HFU for post operative fluid collection vs pelvic abscess. Getting IV ceftriaxone via PICC and PO metronidazole. Says there are good days where she does not feel the pain and bad days where she has abdominal pain 5-6/10 and she is up all night due to pain. She takes tylenol for the pain. Denies nausea, vomiting, had loose stool 2 days ago  wich has resolved. Appetite is not so great - says she eats in bits and pieces. Denies any fevers, chills and sweats. Denies any pain/swelling/tenderness at  the PICC site. She has a fu with Gynecology tomorrow. Discussed with her about need to get repeat CT abdomen/pelvis to evaluate the fluid collection and decide on stopping abtx vs switch to PO abtx. Will continue current antibotics and fu in a week once repeat CT abdomen/pelvis is done.   Review of Systems: ROS 10 point ROS done with pertinent positives and negatives listed above  Past Medical History:  Diagnosis Date   Abnormal Pap smear of cervix    Anxiety    Depression    Dysmenorrhea    Eczema    GERD (gastroesophageal reflux disease)    History of COVID-19 10/24/2020   loss of taste and smellbody aches and cough x 4 days all symptoms resolved   Hypertension    Pre-diabetes    Rash 08/31/2021   comes and goes on legs saw dermetology  no rash now ? food allergy   SUI (stress urinary incontinence, female)    Trichomonas infection 02/2021   Uterine fibroid     Social History   Tobacco Use   Smoking status: Every Day    Packs/day: 0.10    Years: 20.00    Pack years: 2.00    Types: Cigarettes   Smokeless tobacco: Never  Vaping Use   Vaping Use: Never used  Substance Use Topics   Alcohol use: Not Currently   Drug use: Never    Family History  Problem Relation Age of Onset   Hypertension Maternal Grandmother    Diabetes Maternal Grandfather    Colon cancer Neg Hx    Esophageal cancer Neg Hx    Rectal cancer Neg Hx    Stomach cancer Neg Hx     Allergies  Allergen Reactions   Tramadol Other (See Comments)    Headache    Health Maintenance  Topic Date Due   Pneumococcal Vaccine 47-43 Years old (1 - PCV) Never done   COVID-19 Vaccine (4 - Booster for [place] series) 11/24/2020   INFLUENZA VACCINE  05/29/2021   PAP SMEAR-Modifier  03/03/2024   TETANUS/TDAP  11/22/2030   COLONOSCOPY (Pts 45-64yr Insurance coverage will need to be confirmed)  08/19/2031   Hepatitis C Screening  Completed   HIV Screening  Completed   HPV VACCINES  Aged Out    Objective: BP 131/90    Temp 98.2 F (36.8 C)    Ht '5\' 1"'  (1.549 m)    Wt 198 lb (89.8 kg)    LMP 08/18/2021 (Exact Date)    SpO2 97%    BMI 37.41 kg/m    Physical Exam Constitutional:      Appearance: Normal appearance.  HENT:     Head: Normocephalic and atraumatic.      Mouth: Mucous membranes are moist.  Eyes:    Conjunctiva/sclera: Conjunctivae normal.     Pupils: Pupils are equal, round, and reactive to light.   Cardiovascular:     Rate and Rhythm: Normal rate and regular rhythm.     Heart sounds: No murmur heard.  Pulmonary:     Effort: Pulmonary effort is normal.     Breath sounds: Normal breath sounds.   Abdominal:     General:     Palpations: Abdomen is soft. Non tender and non distended   Musculoskeletal:        General: Normal range  of motion.   Skin:    General: Skin is warm and dry.     Comments:  Neurological:     General: No focal deficit  present.     Mental Status: She is alert and oriented to person, place, and time.   Psychiatric:        Mood and Affect: Mood normal.   Lab Results Lab Results  Component Value Date   WBC 10.3 10/03/2021   HGB 10.9 (L) 10/03/2021   HCT 35.1 (L) 10/03/2021   MCV 73.4 (L) 10/03/2021   PLT 395 10/03/2021    Lab Results  Component Value Date   CREATININE 0.67 10/03/2021   BUN 14 10/03/2021   NA 137 10/03/2021   K 4.0 10/03/2021   CL 105 10/03/2021   CO2 24 10/03/2021    Lab Results  Component Value Date   ALT 41 10/03/2021   AST 19 10/03/2021   ALKPHOS 103 10/03/2021   BILITOT 0.3 10/03/2021    Lab Results  Component Value Date   CHOL 205 (H) 12/23/2020   HDL 47 12/23/2020   LDLCALC 142 (H) 12/23/2020   TRIG 90 12/23/2020   CHOLHDL 4.4 12/23/2020   Lab Results  Component Value Date   LABRPR NON-REACTIVE 07/06/2021   No results found for: HIV1RNAQUANT, HIV1RNAVL, CD4TABS   Problem List Items Addressed This Visit   None  Assessment/Plan # Complex Post Op Pelvic Fluid collection ( vs abscess, 4.4cm ) including left adnexa, 4.2cm  ( no positive cultures ) - s/p Total laparoscopic hysterectomy with bilateral salpingectomy, lysis of umbilical adhesions, TVT Exact midurethral sling, cystoscopy 09/11/21 - No surgical or IR intervention done - urine cx 12/4 no growth, no blood cx sent   # Medication Monitoring  12/12 CRP 13.8, ESR 82,  Cr 0.79   # Left renal stone, 2.41m   # Ill defined hypodensity in left liver lobe- non emergent MRI of liver after current issues resolved per Radiology to confirm benignity   Continue IV ceftriaxone and PO metronidazole as is. Extend both antibiotics for 10 days from 12/20 pending CT abdomen/pelvis. New end date 10/27/21 Repeat CT abdomen/pelvis w contrast Fu in a week after CT abdomen/pelvis is done.   Updated OPAT  orders below Discharge antibiotics to be given via PICC line Discharge antibiotics: ceftriaxone 2 g IV daily Per pharmacy protocol  Duration: 10 days  End Date: 10/27/21   [facility]   Home health RN for IV administration and teaching; PICC line care and labs.     Labs weekly while on IV antibiotics: X__ CBC with differential __ BMP X__ CMP X__ CRP X__ ESR __ Vancomycin trough __ CK   __ Please pull PIC at completion of IV antibiotics X__ Please leave PIC in place until doctor has seen patient or been notified  I spent more than 40 minutes for this patient encounter including reviewing data/chart, and coordinating care and >50% direct face to face time providing counseling/discussing diagnostics/treatment plan with patient  SWilber Oliphant MSouth [place] Disease [facility] 10/17/2021, 10:25 AM

## 2021-10-17 NOTE — Telephone Encounter (Signed)
Thank you for coordinating all of this [place]!

## 2021-10-18 ENCOUNTER — Encounter: Payer: Self-pay | Admitting: Obstetrics and Gynecology

## 2021-10-18 ENCOUNTER — Ambulatory Visit (INDEPENDENT_AMBULATORY_CARE_PROVIDER_SITE_OTHER): Payer: BC Managed Care – PPO | Admitting: Obstetrics and Gynecology

## 2021-10-18 VITALS — BP 130/80 | HR 112 | Ht 62.0 in | Wt 190.0 lb

## 2021-10-18 DIAGNOSIS — T8140XA Infection following a procedure, unspecified, initial encounter: Secondary | ICD-10-CM | POA: Diagnosis not present

## 2021-10-18 DIAGNOSIS — N739 Female pelvic inflammatory disease, unspecified: Secondary | ICD-10-CM | POA: Diagnosis not present

## 2021-10-18 DIAGNOSIS — Z90710 Acquired absence of both cervix and uterus: Secondary | ICD-10-CM

## 2021-10-18 MED ORDER — OXYCODONE HCL 5 MG PO TABS: 5.0000 mg | ORAL_TABLET | Freq: Four times a day (QID) | ORAL | 0 refills | Status: DC | PRN

## 2021-10-19 DIAGNOSIS — T8140XA Infection following a procedure, unspecified, initial encounter: Secondary | ICD-10-CM | POA: Diagnosis not present

## 2021-10-19 DIAGNOSIS — Z90710 Acquired absence of both cervix and uterus: Secondary | ICD-10-CM | POA: Diagnosis not present

## 2021-10-19 DIAGNOSIS — N739 Female pelvic inflammatory disease, unspecified: Secondary | ICD-10-CM | POA: Diagnosis not present

## 2021-10-20 DIAGNOSIS — Z90710 Acquired absence of both cervix and uterus: Secondary | ICD-10-CM | POA: Diagnosis not present

## 2021-10-20 DIAGNOSIS — T8140XA Infection following a procedure, unspecified, initial encounter: Secondary | ICD-10-CM | POA: Diagnosis not present

## 2021-10-20 DIAGNOSIS — N739 Female pelvic inflammatory disease, unspecified: Secondary | ICD-10-CM | POA: Diagnosis not present

## 2021-10-21 DIAGNOSIS — T8140XA Infection following a procedure, unspecified, initial encounter: Secondary | ICD-10-CM | POA: Diagnosis not present

## 2021-10-21 DIAGNOSIS — Z90710 Acquired absence of both cervix and uterus: Secondary | ICD-10-CM | POA: Diagnosis not present

## 2021-10-21 DIAGNOSIS — N739 Female pelvic inflammatory disease, unspecified: Secondary | ICD-10-CM | POA: Diagnosis not present

## 2021-10-22 DIAGNOSIS — Z90710 Acquired absence of both cervix and uterus: Secondary | ICD-10-CM | POA: Diagnosis not present

## 2021-10-22 DIAGNOSIS — T8140XA Infection following a procedure, unspecified, initial encounter: Secondary | ICD-10-CM | POA: Diagnosis not present

## 2021-10-22 DIAGNOSIS — N739 Female pelvic inflammatory disease, unspecified: Secondary | ICD-10-CM | POA: Diagnosis not present

## 2021-10-23 DIAGNOSIS — T8140XA Infection following a procedure, unspecified, initial encounter: Secondary | ICD-10-CM | POA: Diagnosis not present

## 2021-10-23 DIAGNOSIS — Z90710 Acquired absence of both cervix and uterus: Secondary | ICD-10-CM | POA: Diagnosis not present

## 2021-10-23 DIAGNOSIS — N739 Female pelvic inflammatory disease, unspecified: Secondary | ICD-10-CM | POA: Diagnosis not present

## 2021-10-24 ENCOUNTER — Encounter: Payer: Self-pay | Admitting: Family

## 2021-10-24 ENCOUNTER — Ambulatory Visit (INDEPENDENT_AMBULATORY_CARE_PROVIDER_SITE_OTHER): Payer: BC Managed Care – PPO | Admitting: Family

## 2021-10-24 ENCOUNTER — Other Ambulatory Visit: Payer: Self-pay

## 2021-10-24 VITALS — BP 123/87 | HR 105 | Temp 98.3°F | Resp 18 | Ht 62.01 in | Wt 188.4 lb

## 2021-10-24 DIAGNOSIS — I2699 Other pulmonary embolism without acute cor pulmonale: Secondary | ICD-10-CM

## 2021-10-24 DIAGNOSIS — T8140XS Infection following a procedure, unspecified, sequela: Secondary | ICD-10-CM

## 2021-10-24 DIAGNOSIS — N739 Female pelvic inflammatory disease, unspecified: Secondary | ICD-10-CM | POA: Diagnosis not present

## 2021-10-24 DIAGNOSIS — K651 Peritoneal abscess: Secondary | ICD-10-CM

## 2021-10-24 DIAGNOSIS — Z90710 Acquired absence of both cervix and uterus: Secondary | ICD-10-CM | POA: Diagnosis not present

## 2021-10-24 DIAGNOSIS — F419 Anxiety disorder, unspecified: Secondary | ICD-10-CM | POA: Diagnosis not present

## 2021-10-24 DIAGNOSIS — F32A Depression, unspecified: Secondary | ICD-10-CM

## 2021-10-24 DIAGNOSIS — T8143XA Infection following a procedure, organ and space surgical site, initial encounter: Secondary | ICD-10-CM

## 2021-10-24 DIAGNOSIS — Z09 Encounter for follow-up examination after completed treatment for conditions other than malignant neoplasm: Secondary | ICD-10-CM

## 2021-10-24 DIAGNOSIS — R160 Hepatomegaly, not elsewhere classified: Secondary | ICD-10-CM

## 2021-10-24 DIAGNOSIS — Z716 Tobacco abuse counseling: Secondary | ICD-10-CM

## 2021-10-24 DIAGNOSIS — R7303 Prediabetes: Secondary | ICD-10-CM

## 2021-10-24 DIAGNOSIS — R0683 Snoring: Secondary | ICD-10-CM

## 2021-10-24 DIAGNOSIS — T8140XA Infection following a procedure, unspecified, initial encounter: Secondary | ICD-10-CM | POA: Diagnosis not present

## 2021-10-24 MED ORDER — SERTRALINE HCL 100 MG PO TABS: 100.0000 mg | ORAL_TABLET | Freq: Every day | ORAL | 0 refills | Status: DC

## 2021-10-24 MED ORDER — HYDROXYZINE PAMOATE 25 MG PO CAPS: 25.0000 mg | ORAL_CAPSULE | Freq: Three times a day (TID) | ORAL | 0 refills | Status: DC | PRN

## 2021-10-24 NOTE — Progress Notes (Signed)
Pt present for hospital follow-up, pt request sleepy study order for suspected sleep apnea ' Refill needed on Zoloft

## 2021-10-25 ENCOUNTER — Ambulatory Visit (HOSPITAL_COMMUNITY)
Admission: RE | Admit: 2021-10-25 | Discharge: 2021-10-25 | Disposition: A | Discharge status: Home / Self Care | Payer: BC Managed Care – PPO | Source: Ambulatory Visit | Attending: Infectious Diseases | Admitting: Infectious Diseases

## 2021-10-25 DIAGNOSIS — T8140XA Infection following a procedure, unspecified, initial encounter: Secondary | ICD-10-CM | POA: Diagnosis not present

## 2021-10-25 DIAGNOSIS — T8140XD Infection following a procedure, unspecified, subsequent encounter: Secondary | ICD-10-CM | POA: Diagnosis not present

## 2021-10-25 DIAGNOSIS — Z90710 Acquired absence of both cervix and uterus: Secondary | ICD-10-CM | POA: Diagnosis not present

## 2021-10-25 DIAGNOSIS — N739 Female pelvic inflammatory disease, unspecified: Secondary | ICD-10-CM | POA: Diagnosis not present

## 2021-10-25 DIAGNOSIS — N200 Calculus of kidney: Secondary | ICD-10-CM | POA: Diagnosis not present

## 2021-10-25 MED ORDER — IOHEXOL 350 MG/ML SOLN
80.0000 mL | Freq: Once | INTRAVENOUS | Status: AC | PRN
  Administered 2021-10-25: 10:00:00 80 mL via INTRAVENOUS

## 2021-10-25 MED ORDER — SODIUM CHLORIDE (PF) 0.9 % IJ SOLN
INTRAMUSCULAR | Status: AC
  Filled 2021-10-25: qty 50

## 2021-10-26 ENCOUNTER — Other Ambulatory Visit: Payer: Self-pay

## 2021-10-26 ENCOUNTER — Encounter: Payer: Self-pay | Admitting: Infectious Diseases

## 2021-10-26 ENCOUNTER — Telehealth: Payer: Self-pay

## 2021-10-26 ENCOUNTER — Ambulatory Visit (INDEPENDENT_AMBULATORY_CARE_PROVIDER_SITE_OTHER): Payer: BC Managed Care – PPO | Admitting: Infectious Diseases

## 2021-10-26 VITALS — BP 118/80 | HR 98 | Temp 98.0°F | Wt 194.0 lb

## 2021-10-26 DIAGNOSIS — T8143XA Infection following a procedure, organ and space surgical site, initial encounter: Secondary | ICD-10-CM

## 2021-10-26 DIAGNOSIS — Z90710 Acquired absence of both cervix and uterus: Secondary | ICD-10-CM | POA: Diagnosis not present

## 2021-10-26 DIAGNOSIS — Z452 Encounter for adjustment and management of vascular access device: Secondary | ICD-10-CM | POA: Diagnosis not present

## 2021-10-26 DIAGNOSIS — F17200 Nicotine dependence, unspecified, uncomplicated: Secondary | ICD-10-CM | POA: Diagnosis not present

## 2021-10-26 DIAGNOSIS — N739 Female pelvic inflammatory disease, unspecified: Secondary | ICD-10-CM | POA: Diagnosis not present

## 2021-10-26 DIAGNOSIS — Z5181 Encounter for therapeutic drug level monitoring: Secondary | ICD-10-CM | POA: Diagnosis not present

## 2021-10-26 DIAGNOSIS — T8140XA Infection following a procedure, unspecified, initial encounter: Secondary | ICD-10-CM | POA: Diagnosis not present

## 2021-10-26 MED ORDER — AMOXICILLIN-POT CLAVULANATE 875-125 MG PO TABS: 1.0000 | ORAL_TABLET | Freq: Two times a day (BID) | ORAL | 0 refills | Status: DC

## 2021-10-26 NOTE — Telephone Encounter (Signed)
Per MD called [facility] with verbal order to pull picc.after last dose on 12/30 Spoke with Jeani Hawking, Pharmacist who was able to take order.  Patient made aware. Leatrice Jewels, RMA

## 2021-10-26 NOTE — Telephone Encounter (Signed)
Thank you :)

## 2021-10-26 NOTE — Progress Notes (Addendum)
Patient Active Problem List   Diagnosis Date Noted   Postoperative intra-abdominal abscess    Medication monitoring encounter    Post-operative infection 10/01/2021   Pelvic infection in female 09/24/2021   Status post laparoscopic hysterectomy 09/11/2021   Cholelithiasis 01/13/2019   Pre-diabetes 11/15/2016   History of bilateral tubal ligation 09/28/2015   Tobacco abuse 09/28/2015    Patient's Medications  New Prescriptions   No medications on file  Previous Medications   ACETAMINOPHEN (TYLENOL) 500 MG TABLET    Take 2 tablets (1,000 mg total) by mouth every 6 (six) hours as needed for mild pain.   AMLODIPINE (NORVASC) 10 MG TABLET    Take 1 tablet (10 mg total) by mouth daily.   APIXABAN (ELIQUIS) 5 MG TABS TABLET    Take 1 tablet (5 mg total) by mouth 2 (two) times daily.   APIXABAN (ELIQUIS) VTE STARTER PACK (10MG AND 5MG)    Take as directed on package: start with two-48m tablets twice daily for 7 days. On day 8, switch to one-574mtablet twice daily.   BETAMETHASONE DIPROPIONATE 0.05 % CREAM    Apply 1 application topically daily as needed (itching/ allergic reaction).   CEFTRIAXONE 2 G IN SODIUM CHLORIDE 0.9 % 100 ML    Inject 2 g into the vein daily.   HYDROXYZINE (VISTARIL) 25 MG CAPSULE    Take 1 capsule (25 mg total) by mouth every 8 (eight) hours as needed.   LEVOCETIRIZINE (XYZAL) 5 MG TABLET    Take 5 mg by mouth at bedtime.   METFORMIN (GLUCOPHAGE) 500 MG TABLET    Take 1 tablet (500 mg total) by mouth 2 (two) times daily with a meal.   METRONIDAZOLE (FLAGYL) 500 MG TABLET    Take 1 tablet (500 mg total) by mouth 2 (two) times daily for 10 days. Stop date 10/17/21   NICOTINE (NICODERM CQ - DOSED IN MG/24 HOURS) 14 MG/24HR PATCH    Place 1 patch (14 mg total) onto the skin daily.   OXYCODONE (OXY IR/ROXICODONE) 5 MG IMMEDIATE RELEASE TABLET    Take 1 tablet (5 mg total) by mouth every 6 (six) hours as needed for severe pain.   POLYETHYLENE GLYCOL (MIRALAX)  17 G PACKET    Take 17 g by mouth daily as needed.   PRENATAL VIT-FE FUMARATE-FA (PRENATAL MULTIVITAMIN) TABS TABLET    Take 1 tablet by mouth daily at 12 noon.   SERTRALINE (ZOLOFT) 100 MG TABLET    Take 1 tablet (100 mg total) by mouth at bedtime.   SIMETHICONE (GAS-X MAXIMUM STRENGTH PO)    Take 1 tablet by mouth at bedtime as needed (gas/ bloating).  Modified Medications   No medications on file  Discontinued Medications   No medications on file    Subjective: Here for follow up for post operative intraabdominal fluid collection, possibly abscess. Getting IV ceftriaxone through RT arm PICC and PO metronidazole. CT abd/pelvis 12/28 with following findings  IMPRESSION: 1. The collection near the vaginal cuff has resolved. Left ovarian structure/collection with septations has decreased in size. No new fluid or abscess collections. 2. Indeterminate findings in the upper abdomen. 1.7 cm structure in the left hepatic lobe that could represent a cavernous hemangioma but indeterminate. There is also an indeterminate low-density structure involving the right kidney upper pole that measures roughly 1.2 cm. There is also a small low-density nodule just posterior to the right adrenal gland measuring up to 1.2  cm that is indeterminate. Consider a non emergent abdominal MRI, with and without contrast to further characterize these findings. 3. Nonobstructive left renal calculus.    Denies any issues with PICC. Denies fevers, chills and sweats. Denies nausea, vomiting and diarrhea. She feels abdominal discomfort but not abdominal pain. She has been closely following with her PCP and Gyn. Overall feeling well and no complaints today.    Review of Systems: ROS 10 point ROS done with pertinent positives and negatives as above   Past Medical History:  Diagnosis Date   Abnormal Pap smear of cervix    Anxiety    Depression    Dysmenorrhea    Eczema    GERD (gastroesophageal reflux disease)     History of COVID-19 10/24/2020   loss of taste and smellbody aches and cough x 4 days all symptoms resolved   Hypertension    Pre-diabetes    Rash 08/31/2021   comes and goes on legs saw dermetology no rash now ? food allergy   SUI (stress urinary incontinence, female)    Trichomonas infection 02/2021   Uterine fibroid    Past Surgical History:  Procedure Laterality Date   BLADDER SUSPENSION N/A 09/11/2021   Procedure: TRANSVAGINAL TAPE (TVT) PROCEDURE;  Surgeon: [name], MD;  Location: [facility];  Service: Gynecology;  Laterality: N/A;   CESAREAN SECTION  1997   x 1   CHOLECYSTECTOMY N/A 01/14/2019   Procedure: LAPAROSCOPIC CHOLECYSTECTOMY WITH POSSIBLE  INTRAOPERATIVE CHOLANGIOGRAM;  Surgeon: Armandina Gemma, MD;  Location: [facility];  Service: General;  Laterality: N/A;   CYSTOSCOPY N/A 09/11/2021   Procedure: Consuela Mimes;  Surgeon: [name], MD;  Location: [facility];  Service: Gynecology;  Laterality: N/A;   DILITATION & CURRETTAGE/HYSTROSCOPY WITH HYDROTHERMAL ABLATION N/A 06/22/2016   Procedure: DILATATION & CURETTAGE/HYSTEROSCOPY WITH HYDROTHERMAL ABLATION;  Surgeon: Shelly Bombard, MD;  Location: [facility];  Service: Gynecology;  Laterality: N/A;   TOTAL LAPAROSCOPIC HYSTERECTOMY WITH SALPINGECTOMY Bilateral 09/11/2021   Procedure: TOTAL LAPAROSCOPIC HYSTERECTOMY WITH SALPINGECTOMY;  Surgeon: [name], MD;  Location[facility];  Service: Gynecology;  Laterality: Bilateral;   TUBAL LIGATION     09-28-2015    Social History   Tobacco Use   Smoking status: Former    Packs/day: 0.10    Years: 20.00    Pack years: 2.00    Types: Cigarettes    Quit date: 10/01/2021    Years since quitting: 0.0   Smokeless tobacco: Never  Vaping Use   Vaping Use: Never used  Substance Use Topics   Alcohol use: Not Currently   Drug use: Never    Family History  Problem Relation Age of Onset    Hypertension Maternal Grandmother    Diabetes Maternal Grandfather    Colon cancer Neg Hx    Esophageal cancer Neg Hx    Rectal cancer Neg Hx    Stomach cancer Neg Hx     Allergies  Allergen Reactions   Tramadol Other (See Comments)    Headache    Health Maintenance  Topic Date Due   Pneumococcal Vaccine 81-47 Years old (1 - PCV) Never done   COVID-19 Vaccine (4 - Booster for Pfizer series) 11/24/2020   INFLUENZA VACCINE  05/29/2021   PAP SMEAR-Modifier  03/03/2024   TETANUS/TDAP  11/22/2030   COLONOSCOPY (Pts 45-49yr Insurance coverage will need to be confirmed)  08/19/2031   Hepatitis C Screening  Completed  HIV Screening  Completed   HPV VACCINES  Aged Out    Objective: BP 118/80    Pulse 98    Temp 98 F (36.7 C) (Oral)    Wt 194 lb (88 kg)    LMP 08/18/2021 (Exact Date)    SpO2 98%    BMI 35.47 kg/m    Physical Exam Constitutional:      Appearance: Normal appearance.  HENT:     Head: Normocephalic and atraumatic.      Mouth: Mucous membranes are moist.  Eyes:    Conjunctiva/sclera: Conjunctivae normal.     Pupils: Pupils are equal, round, and reactive to light.   Cardiovascular:     Rate and Rhythm: Normal rate and regular rhythm.     Heart sounds: No murmur heard.  Pulmonary:     Effort: Pulmonary effort is normal.     Breath sounds: Normal breath sounds.   Abdominal:     General:     Palpations: Abdomen is soft, non tender, BS normal   Musculoskeletal:        General: Normal range of motion.   Skin:    General: Skin is warm and dry.     Comments: RT arm PICC OK  Neurological:     General: No focal deficit present.     Mental Status: She is awake,  alert and oriented to person, place, and time.   Psychiatric:        Mood and Affect: Mood normal.   Lab Results Lab Results  Component Value Date   WBC 10.3 10/03/2021   HGB 10.9 (L) 10/03/2021   HCT 35.1 (L) 10/03/2021   MCV 73.4 (L) 10/03/2021   PLT 395 10/03/2021    Lab Results   Component Value Date   CREATININE 0.67 10/03/2021   BUN 14 10/03/2021   NA 137 10/03/2021   K 4.0 10/03/2021   CL 105 10/03/2021   CO2 24 10/03/2021    Lab Results  Component Value Date   ALT 41 10/03/2021   AST 19 10/03/2021   ALKPHOS 103 10/03/2021   BILITOT 0.3 10/03/2021    Lab Results  Component Value Date   CHOL 205 (H) 12/23/2020   HDL 47 12/23/2020   LDLCALC 142 (H) 12/23/2020   TRIG 90 12/23/2020   CHOLHDL 4.4 12/23/2020   Lab Results  Component Value Date   LABRPR NON-REACTIVE 07/06/2021   No results found for: HIV1RNAQUANT, HIV1RNAVL, CD4TABS  Problem List Items Addressed This Visit       Other   Postoperative intra-abdominal abscess - Primary   Medication monitoring encounter   Smoking   PICC (peripherally inserted central catheter) in place     Assessment/Plan # Complex post operative fluid collection, possible abscess including left adnexa  -s/p Total laparoscopic hysterectomy with bilateral salpingectomy, lysis of umbilical adhesions, TVT Exact midurethral sling, cystoscopy 09/11/21 -On IV ceftriaxone and PO metronidazole through PICC -CT abd/pelvis 12/28 with improved findings as above   # Medication Monitoring  12/20 Cr 0.8 ESR 56, CRP 38 12/19 Cr 0.77, ESR 112, CRP 18 12/27 cr 0.69, ESR 88, CRP 14  # Smoking - congratulated on quitting # PICC - no concerns   Continue IV ceftriaxone and PO metronidazole until 12/30 PICC line and IV abtx to be discontinued after last dose on 12/30 Start Augmentin 875/125 mg PO BID from 12/31 for 2 weeks Fu in 2-3 weeks Fu with Gyn  I have personally spent more than 70 minutes involved in  face-to-face and non-face-to-face activities for this patient on the day of the visit. Professional time spent includes the following activities: Preparing to see the patient (review of tests), Obtaining and/or reviewing separately obtained history (admission/discharge record), Performing a medically appropriate  examination and/or evaluation , Ordering medications/tests/procedures, referring and communicating with other health care professionals, Documenting clinical information in the EMR, Independently interpreting results (not separately reported), Communicating results to the patient/family/caregiver, Counseling and educating the patient/family/caregiver and Care coordination (not separately reported).   Wilber Oliphant, [facility] 10/26/2021, 3:39 PM

## 2021-10-27 ENCOUNTER — Ambulatory Visit: Payer: BC Managed Care – PPO | Admitting: Infectious Diseases

## 2021-10-27 DIAGNOSIS — Z90710 Acquired absence of both cervix and uterus: Secondary | ICD-10-CM | POA: Diagnosis not present

## 2021-10-27 DIAGNOSIS — T8140XA Infection following a procedure, unspecified, initial encounter: Secondary | ICD-10-CM | POA: Diagnosis not present

## 2021-10-27 DIAGNOSIS — N739 Female pelvic inflammatory disease, unspecified: Secondary | ICD-10-CM | POA: Diagnosis not present

## 2021-11-03 ENCOUNTER — Encounter: Payer: Self-pay | Admitting: Infectious Diseases

## 2021-11-03 DIAGNOSIS — R110 Nausea: Secondary | ICD-10-CM

## 2021-11-03 MED ORDER — ONDANSETRON HCL 4 MG PO TABS: 4.0000 mg | ORAL_TABLET | Freq: Four times a day (QID) | ORAL | 0 refills | Status: DC | PRN

## 2021-11-03 NOTE — Addendum Note (Signed)
Addended by: Lucie Leather D on: 11/03/2021 04:47 PM   Modules accepted: Orders

## 2021-11-07 ENCOUNTER — Ambulatory Visit (INDEPENDENT_AMBULATORY_CARE_PROVIDER_SITE_OTHER): Payer: BC Managed Care – PPO | Admitting: Pulmonary Disease

## 2021-11-07 ENCOUNTER — Encounter: Payer: Self-pay | Admitting: Pulmonary Disease

## 2021-11-07 ENCOUNTER — Other Ambulatory Visit: Payer: Self-pay

## 2021-11-07 VITALS — BP 142/74 | HR 92 | Temp 98.4°F | Ht 62.0 in | Wt 197.4 lb

## 2021-11-07 DIAGNOSIS — R0609 Other forms of dyspnea: Secondary | ICD-10-CM | POA: Diagnosis not present

## 2021-11-07 NOTE — Patient Instructions (Signed)
Nice to meet you  Continue the Eliquis - we will meet in March to decide if we need to extend it any further  We will get PFTs or breathing tests at your next visit and we will discuss results that same day  PFT and f/u with [facility] Early March 2023

## 2021-11-07 NOTE — Progress Notes (Signed)
@Patient  ID: Brianna Gutierrez, female    DOB: 07-12-1976, 46 y.o.   MRN: [id number]  Chief Complaint  Patient presents with   Consult    Consult for a CT scan done in December that showed at right pulmonary embolism. Pt states that she does have SHOB. Pt states she just started snoring now and gets very tired now     Referring provider: [name], NP  HPI:   46 y.o. woman whom we are seeing in consultation for evaluation of acute pulmonary embolus.  Discharge summary x3 reviewed.  Patient had hysterectomy 08/2021.  Represented to hospital with abdominal pain.  Concern for infected fluid collection.  Incidentally discovered right lower lobe PE on CT scan 09/27/2021 during second hospitalization.  Placed on heparin.  Subsequently placed on Eliquis.  She had TTE shortly after PE diagnosis that showed normal RV function, RA size, no evidence of elevated RVSP or PASP.  Her CT scan there showed a clear lungs in the bases on my review interpretation.  Most recent chest x-ray 09/2021 with linear infiltrates in bilateral bases consistent with atelectasis on my review interpretation.  Overall, improving.  Abdominal pain better after multiple rounds of antibiotics.  Continues Augmentin.  Endorses fatigue overall.  Some soreness has developed.  Polysomnography has been ordered by other provider.  Feels little short of breath particularly inclines or stairs.  Nothing else makes things better or worse.  No position, no time of day, no seasonal environmental factors she can identify that could contribute.  PMH: Hypertension, seasonal allergies, sleep apnea Surgical history: Cholecystectomy, hysterectomy, tubal ligation Family history: Reviewed, denies significant respiratory illness in first-degree relatives Social history: Former smoker, lives in [place] / Pulmonary Flowsheets:   ACT:  No flowsheet data found.  MMRC: No flowsheet data found.  Epworth:  No flowsheet data  found.  Tests:   FENO:  No results found for: NITRICOXIDE  PFT: No flowsheet data found.  WALK:  No flowsheet data found.  Imaging: Personally reviewed and as per EMR discussion this note DG Chest 2 View  Result Date: 10/10/2021 CLINICAL DATA:  picc line placement EXAM: CHEST - 2 VIEW COMPARISON:  11/04/2017. FINDINGS: Right upper extremity PICC with the tip projecting at the lower SVC. Linear opacities in bilateral lung bases, compatible subsegmental atelectasis. No confluent consolidation. No visible pleural effusions or pneumothorax. Cardiomediastinal silhouette is similar. Similar tortuous aorta. Cholecystectomy clips. Mild reverse S-shaped thoracolumbar curvature. IMPRESSION: 1. Right upper extremity PICC with the tip projecting at the lower SVC. 2. Bibasilar subsegmental atelectasis. Electronically Signed   By: Margaretha Sheffield M.D.   On: 10/10/2021 14:48   CT ABDOMEN PELVIS W CONTRAST  Result Date: 10/25/2021 CLINICAL DATA:  Postoperative infection, unspecified type. History of a total laparoscopic hysterectomy and bilateral salpingectomy on 09/11/2021. EXAM: CT ABDOMEN AND PELVIS WITH CONTRAST TECHNIQUE: Multidetector CT imaging of the abdomen and pelvis was performed using the standard protocol following bolus administration of intravenous contrast. CONTRAST:  77mL OMNIPAQUE IOHEXOL 350 MG/ML SOLN COMPARISON:  10/01/2021 and 09/24/2021. FINDINGS: Lower chest: Patchy densities at the lung bases that could represent areas of atelectasis and/or scarring. No pleural effusions. Hepatobiliary: Cholecystectomy. Again noted is a poorly defined low-density structure in the left hepatic lobe on sequence 2 image 12 that measures 1.7 cm. This is less conspicuous on the delayed images and could represent a cavernous hemangioma. Otherwise, normal appearance of the liver. Portal venous system is patent. No biliary dilatation. Pancreas: Unremarkable. No pancreatic ductal  dilatation or surrounding  inflammatory changes. Spleen: Normal in size without focal abnormality. Adrenals/Urinary Tract: Again noted is a small low-density nodule just posterior to the right adrenal gland that measures roughly 8 mm on the axial images and up to 12 mm on sequence 6 image 36. This has not significantly changed since 09/24/2021 but this area is indeterminate. Stable appearance of the right kidney without hydronephrosis. 6 mm stone in left kidney inter pole region. Negative for left hydronephrosis. Urinary bladder is decompressed. Stable appearance of the adrenal glands. Indeterminate low-density structure along the right kidney upper pole measuring up to 1.2 cm. This low-density structure along the right kidney upper pole is probably too dense to be considered a simple cyst and this area is indeterminate. Stomach/Bowel: Normal appearance of the stomach and duodenum. No bowel distention or obstruction. Oral contrast in the colon including the rectum. Normal appendix. Vascular/Lymphatic: No significant vascular findings are present. No enlarged abdominal or pelvic lymph nodes. Reproductive: Uterus has been surgically removed. Again noted is enlargement of the left ovary with probable septations. Overall size of the left ovary region measures 4.1 x 3.4 cm on sequence 2 image 62 and previously measured 5.4 x 4.3 cm at a similar level. There was previously a small low-density collection contiguous with the left ovary that extended towards the vaginal cuff and this collection has resolved. Stable appearance of the right ovary. There is residual stranding in the pelvis near the urinary bladder. Other: Residual mild stranding in the pelvis. There is no significant free fluid. The fluid collection near the vaginal cuff that was continuous with the left ovary has essentially resolved. No new fluid or abscess collections. Negative for free air. Ventral hernia above the umbilicus containing fat. Musculoskeletal: No acute bone abnormality.  IMPRESSION: 1. The collection near the vaginal cuff has resolved. Left ovarian structure/collection with septations has decreased in size. No new fluid or abscess collections. 2. Indeterminate findings in the upper abdomen. 1.7 cm structure in the left hepatic lobe that could represent a cavernous hemangioma but indeterminate. There is also an indeterminate low-density structure involving the right kidney upper pole that measures roughly 1.2 cm. There is also a small low-density nodule just posterior to the right adrenal gland measuring up to 1.2 cm that is indeterminate. Consider a non emergent abdominal MRI, with and without contrast to further characterize these findings. 3. Nonobstructive left renal calculus. Electronically Signed   By: Markus Daft M.D.   On: 10/25/2021 15:42    Lab Results: Personally reviewed: CBC    Component Value Date/Time   WBC 10.3 10/03/2021 0431   RBC 4.78 10/03/2021 0431   HGB 10.9 (L) 10/03/2021 0431   HGB 13.6 12/23/2020 1528   HCT 35.1 (L) 10/03/2021 0431   HCT 43.5 12/23/2020 1528   PLT 395 10/03/2021 0431   PLT 375 12/23/2020 1528   MCV 73.4 (L) 10/03/2021 0431   MCV 73 (L) 12/23/2020 1528   MCH 22.8 (L) 10/03/2021 0431   MCHC 31.1 10/03/2021 0431   RDW 15.7 (H) 10/03/2021 0431   RDW 14.8 12/23/2020 1528   LYMPHSABS 1.7 10/03/2021 0431   LYMPHSABS 1.4 12/30/2018 1648   MONOABS 1.0 10/03/2021 0431   EOSABS 0.8 (H) 10/03/2021 0431   EOSABS 0.2 12/30/2018 1648   BASOSABS 0.0 10/03/2021 0431   BASOSABS 0.0 12/30/2018 1648    BMET    Component Value Date/Time   NA 137 10/03/2021 0431   NA 136 11/22/2020 1700   K 4.0 10/03/2021  0431   CL 105 10/03/2021 0431   CO2 24 10/03/2021 0431   GLUCOSE 97 10/03/2021 0431   BUN 14 10/03/2021 0431   BUN 11 11/22/2020 1700   CREATININE 0.67 10/03/2021 0431   CREATININE 0.62 11/22/2015 1537   CALCIUM 8.9 10/03/2021 0431   GFRNONAA >60 10/03/2021 0431   GFRNONAA >89 11/22/2015 1537   GFRAA 107 11/22/2020  1700   GFRAA >89 11/22/2015 1537    BNP    Component Value Date/Time   BNP 248.2 (H) 09/27/2021 1851    ProBNP    Component Value Date/Time   PROBNP 7.2 11/23/2010 2043    Specialty Problems   None   Allergies  Allergen Reactions   Tramadol Other (See Comments)    Headache    Immunization History  Administered Date(s) Administered   Influenza,inj,Quad PF,6+ Mos 09/26/2016, 11/22/2020   PFIZER(Purple Top)SARS-COV-2 Vaccination 01/10/2020, 02/07/2020, 09/29/2020   Tdap 11/22/2020    Past Medical History:  Diagnosis Date   Abnormal Pap smear of cervix    Anxiety    Depression    Dysmenorrhea    Eczema    GERD (gastroesophageal reflux disease)    History of COVID-19 10/24/2020   loss of taste and smellbody aches and cough x 4 days all symptoms resolved   Hypertension    Pre-diabetes    Rash 08/31/2021   comes and goes on legs saw dermetology no rash now ? food allergy   SUI (stress urinary incontinence, female)    Trichomonas infection 02/2021   Uterine fibroid     Tobacco History: Social History   Tobacco Use  Smoking Status Former   Packs/day: 0.10   Years: 20.00   Pack years: 2.00   Types: Cigarettes   Quit date: 10/01/2021   Years since quitting: 0.1  Smokeless Tobacco Never   Counseling given: Not Answered   Continue to not smoke  Outpatient Encounter Medications as of 11/07/2021  Medication Sig   acetaminophen (TYLENOL) 500 MG tablet Take 2 tablets (1,000 mg total) by mouth every 6 (six) hours as needed for mild pain.   amoxicillin-clavulanate (AUGMENTIN) 875-125 MG tablet Take 1 tablet by mouth 2 (two) times daily.   apixaban (ELIQUIS) 5 MG TABS tablet Take 1 tablet (5 mg total) by mouth 2 (two) times daily.   APIXABAN (ELIQUIS) VTE STARTER PACK (10MG  AND 5MG ) Take as directed on package: start with two-5mg  tablets twice daily for 7 days. On day 8, switch to one-5mg  tablet twice daily.   betamethasone dipropionate 0.05 % cream Apply 1  application topically daily as needed (itching/ allergic reaction).   hydrOXYzine (VISTARIL) 25 MG capsule Take 1 capsule (25 mg total) by mouth every 8 (eight) hours as needed.   levocetirizine (XYZAL) 5 MG tablet Take 5 mg by mouth at bedtime.   metFORMIN (GLUCOPHAGE) 500 MG tablet Take 1 tablet (500 mg total) by mouth 2 (two) times daily with a meal.   nicotine (NICODERM CQ - DOSED IN MG/24 HOURS) 14 mg/24hr patch Place 1 patch (14 mg total) onto the skin daily.   ondansetron (ZOFRAN) 4 MG tablet Take 1 tablet (4 mg total) by mouth every 6 (six) hours as needed for nausea or vomiting.   oxyCODONE (OXY IR/ROXICODONE) 5 MG immediate release tablet Take 1 tablet (5 mg total) by mouth every 6 (six) hours as needed for severe pain.   Prenatal Vit-Fe Fumarate-FA (PRENATAL MULTIVITAMIN) TABS tablet Take 1 tablet by mouth daily at 12 noon.   sertraline (ZOLOFT)  100 MG tablet Take 1 tablet (100 mg total) by mouth at bedtime.   amLODipine (NORVASC) 10 MG tablet Take 1 tablet (10 mg total) by mouth daily. (Patient taking differently: Take 10 mg by mouth at bedtime.)   [DISCONTINUED] cefTRIAXone 2 g in sodium chloride 0.9 % 100 mL Inject 2 g into the vein daily.   [DISCONTINUED] dicyclomine (BENTYL) 20 MG tablet Take 1 tablet (20 mg total) by mouth 2 (two) times daily.   [DISCONTINUED] omeprazole (PRILOSEC) 20 MG capsule Take 1 capsule (20 mg total) by mouth daily.   [DISCONTINUED] polyethylene glycol (MIRALAX) 17 g packet Take 17 g by mouth daily as needed.   [DISCONTINUED] Simethicone (GAS-X MAXIMUM STRENGTH PO) Take 1 tablet by mouth at bedtime as needed (gas/ bloating).   No facility-administered encounter medications on file as of 11/07/2021.     Review of Systems  Review of Systems  No chest pain with exertion.  No orthopnea or PND.  No abdominal pain.  Comprehensive review of systems otherwise negative. Physical Exam  BP (!) 142/74 (BP Location: Left Arm, Patient Position: Sitting, Cuff Size:  Normal)    Pulse 92    Temp 98.4 F (36.9 C) (Oral)    Ht 5\' 2"  (1.575 m)    Wt 197 lb 6.4 oz (89.5 kg)    LMP 08/18/2021 (Exact Date)    SpO2 99%    BMI 36.10 kg/m   Wt Readings from Last 5 Encounters:  11/07/21 197 lb 6.4 oz (89.5 kg)  10/26/21 194 lb (88 kg)  10/24/21 188 lb 6.4 oz (85.5 kg)  10/18/21 190 lb (86.2 kg)  10/17/21 198 lb (89.8 kg)    BMI Readings from Last 5 Encounters:  11/07/21 36.10 kg/m  10/26/21 35.47 kg/m  10/24/21 34.45 kg/m  10/18/21 34.75 kg/m  10/17/21 37.41 kg/m     Physical Exam General: Well-appearing, no acute distress Eyes: EOMI, icterus Neck: Supple, no JVP Pulmonary: Clear, no work of breathing Cardiovascular: Regular rhythm, no murmur Abdomen: Nondistended, bowel sounds present MSK: No synovitis, no joint effusions Skin: Easy bruising throughout Neuro: Normal gait, no weakness Psych: Normal mood, full affect   Assessment & Plan:   Provoked pulmonary embolus following hysterectomy: Plan 3 months anticoagulation.  Continue Eliquis.  Reassess duration at next visit.  Consider VQ scan if symptoms not improving.  Dyspnea on exertion: Likely deconditioning in setting of her multiple hospitalizations over the last couple months.  Will obtain PFTs at next visit for further evaluation.  Encouraged her to continue to stay active, graduated exercise routine.   Return in about 8 weeks (around 01/02/2022).   Lanier Clam, MD 11/07/2021

## 2021-11-08 ENCOUNTER — Encounter: Payer: Self-pay | Admitting: Obstetrics and Gynecology

## 2021-11-08 NOTE — Telephone Encounter (Signed)
Dr. Quincy Simmonds, No appointment openings today. They have several openings in the morning.s

## 2021-11-08 NOTE — Telephone Encounter (Signed)
I can see [name] at about 2:00 or sometime tomorrow.

## 2021-11-09 ENCOUNTER — Encounter: Payer: Self-pay | Admitting: Infectious Diseases

## 2021-11-09 ENCOUNTER — Telehealth: Payer: Self-pay | Admitting: Obstetrics and Gynecology

## 2021-11-09 ENCOUNTER — Other Ambulatory Visit: Payer: Self-pay

## 2021-11-09 ENCOUNTER — Ambulatory Visit (INDEPENDENT_AMBULATORY_CARE_PROVIDER_SITE_OTHER): Payer: BC Managed Care – PPO | Admitting: Obstetrics and Gynecology

## 2021-11-09 ENCOUNTER — Encounter: Payer: Self-pay | Admitting: Obstetrics and Gynecology

## 2021-11-09 VITALS — BP 122/80 | HR 88 | Ht 62.0 in | Wt 177.0 lb

## 2021-11-09 DIAGNOSIS — N362 Urethral caruncle: Secondary | ICD-10-CM

## 2021-11-09 DIAGNOSIS — R319 Hematuria, unspecified: Secondary | ICD-10-CM | POA: Diagnosis not present

## 2021-11-09 DIAGNOSIS — N760 Acute vaginitis: Secondary | ICD-10-CM | POA: Diagnosis not present

## 2021-11-09 LAB — WET PREP FOR TRICH, YEAST, CLUE

## 2021-11-09 MED ORDER — NYSTATIN-TRIAMCINOLONE 100000-0.1 UNIT/GM-% EX CREA: 1.0000 "application " | TOPICAL_CREAM | Freq: Two times a day (BID) | CUTANEOUS | 0 refills | Status: DC

## 2021-11-09 MED ORDER — SULFAMETHOXAZOLE-TRIMETHOPRIM 800-160 MG PO TABS: 1.0000 | ORAL_TABLET | Freq: Two times a day (BID) | ORAL | 0 refills | Status: DC

## 2021-11-09 MED ORDER — FLUCONAZOLE 150 MG PO TABS: 150.0000 mg | ORAL_TABLET | Freq: Once | ORAL | 0 refills | Status: AC

## 2021-11-09 NOTE — Addendum Note (Signed)
Addended by: Yisroel Ramming, Michall Noffke E on: 11/09/2021 01:30 PM   Modules accepted: Orders

## 2021-11-09 NOTE — Telephone Encounter (Signed)
Medical records with referral request faxed to [facility].

## 2021-11-09 NOTE — Progress Notes (Signed)
GYNECOLOGY  VISIT   HPI: 46 y.o.   Married  [place] American  female   G4P0 with Patient's last menstrual period was 08/18/2021 (exact date).   here for hematuria and vaginal irritation. Patient also states complaining of left sided pain again.    Patient has been treated for pelvic abscess following laparoscopic hysterectomy and is followed by Infectious Disease. She was treated with IV Ceftriaxone and oral Flagyl.   Her PICC line has been removed and she is now taking Augmentin.  Her last CT scan below shows resolution of the pelvic abscess.  She has follow up with ID 11/17/21.   She is now on Augmentin.   Her tongue feels numb.  She states pulmonary did not see signs of thrush.   States she was up all night and using a heating pad due to left sided pain which started yesterday.   She is taking Tylenol also.  Reports that the bleeding is less today.  CT from 10/25/21 shows a 6 mm left renal stone.  At the time of her hysterectomy, she was noted to have some urethral polyps.   No pain with urination, but does have some itching.  She is using OTC Monistat type cream to try to control the itching.   She states her bladder control is good.   Having regular BMs.   Denies shortness of breath.   CLINICAL DATA:  Postoperative infection, unspecified type. History of a total laparoscopic hysterectomy and bilateral salpingectomy on 09/11/2021.   EXAM: CT ABDOMEN AND PELVIS WITH CONTRAST   TECHNIQUE: Multidetector CT imaging of the abdomen and pelvis was performed using the standard protocol following bolus administration of intravenous contrast.   CONTRAST:  35mL OMNIPAQUE IOHEXOL 350 MG/ML SOLN   COMPARISON:  10/01/2021 and 09/24/2021.   FINDINGS: Lower chest: Patchy densities at the lung bases that could represent areas of atelectasis and/or scarring. No pleural effusions.   Hepatobiliary: Cholecystectomy. Again noted is a poorly defined low-density structure in the left  hepatic lobe on sequence 2 image 12 that measures 1.7 cm. This is less conspicuous on the delayed images and could represent a cavernous hemangioma. Otherwise, normal appearance of the liver. Portal venous system is patent. No biliary dilatation.   Pancreas: Unremarkable. No pancreatic ductal dilatation or surrounding inflammatory changes.   Spleen: Normal in size without focal abnormality.   Adrenals/Urinary Tract: Again noted is a small low-density nodule just posterior to the right adrenal gland that measures roughly 8 mm on the axial images and up to 12 mm on sequence 6 image 36. This has not significantly changed since 09/24/2021 but this area is indeterminate. Stable appearance of the right kidney without hydronephrosis. 6 mm stone in left kidney inter pole region. Negative for left hydronephrosis. Urinary bladder is decompressed. Stable appearance of the adrenal glands. Indeterminate low-density structure along the right kidney upper pole measuring up to 1.2 cm. This low-density structure along the right kidney upper pole is probably too dense to be considered a simple cyst and this area is indeterminate.   Stomach/Bowel: Normal appearance of the stomach and duodenum. No bowel distention or obstruction. Oral contrast in the colon including the rectum. Normal appendix.   Vascular/Lymphatic: No significant vascular findings are present. No enlarged abdominal or pelvic lymph nodes.   Reproductive: Uterus has been surgically removed. Again noted is enlargement of the left ovary with probable septations. Overall size of the left ovary region measures 4.1 x 3.4 cm on sequence 2 image 62 and  previously measured 5.4 x 4.3 cm at a similar level. There was previously a small low-density collection contiguous with the left ovary that extended towards the vaginal cuff and this collection has resolved. Stable appearance of the right ovary. There is residual stranding in the pelvis  near the urinary bladder.   Other: Residual mild stranding in the pelvis. There is no significant free fluid. The fluid collection near the vaginal cuff that was continuous with the left ovary has essentially resolved. No new fluid or abscess collections. Negative for free air. Ventral hernia above the umbilicus containing fat.   Musculoskeletal: No acute bone abnormality.   IMPRESSION: 1. The collection near the vaginal cuff has resolved. Left ovarian structure/collection with septations has decreased in size. No new fluid or abscess collections. 2. Indeterminate findings in the upper abdomen. 1.7 cm structure in the left hepatic lobe that could represent a cavernous hemangioma but indeterminate. There is also an indeterminate low-density structure involving the right kidney upper pole that measures roughly 1.2 cm. There is also a small low-density nodule just posterior to the right adrenal gland measuring up to 1.2 cm that is indeterminate. Consider a non emergent abdominal MRI, with and without contrast to further characterize these findings. 3. Nonobstructive left renal calculus.     Electronically Signed   By: Markus Daft M.D.   On: 10/25/2021 15:42    GYNECOLOGIC HISTORY: Patient's last menstrual period was 08/18/2021 (exact date). Contraception:  Hyst Menopausal hormone therapy:  none Last mammogram:  09/05/21- neg birads 1 Last pap smear: 03-03-21 Neg:Neg HR HPV, 11-13-16 Neg:Neg HR HPV        OB History     Gravida  4   Para      Term      Preterm      AB      Living  4      SAB      IAB      Ectopic      Multiple      Live Births  4              Patient Active Problem List   Diagnosis Date Noted   Smoking 10/26/2021   PICC (peripherally inserted central catheter) in place 10/26/2021   Postoperative intra-abdominal abscess    Medication monitoring encounter    Post-operative infection 10/01/2021   Pelvic infection in female 09/24/2021    Status post laparoscopic hysterectomy 09/11/2021   Cholelithiasis 01/13/2019   Pre-diabetes 11/15/2016   History of bilateral tubal ligation 09/28/2015   Tobacco abuse 09/28/2015    Past Medical History:  Diagnosis Date   Abnormal Pap smear of cervix    Anxiety    Depression    Dysmenorrhea    Eczema    GERD (gastroesophageal reflux disease)    History of COVID-19 10/24/2020   loss of taste and smellbody aches and cough x 4 days all symptoms resolved   Hypertension    Pre-diabetes    Rash 08/31/2021   comes and goes on legs saw dermetology no rash now ? food allergy   SUI (stress urinary incontinence, female)    Trichomonas infection 02/2021   Uterine fibroid     Past Surgical History:  Procedure Laterality Date   BLADDER SUSPENSION N/A 09/11/2021   Procedure: TRANSVAGINAL TAPE (TVT) PROCEDURE;  Surgeon: [name], MD;  Location: [facility];  Service: Gynecology;  Laterality: N/A;   [place]   x  1   CHOLECYSTECTOMY N/A 01/14/2019   Procedure: LAPAROSCOPIC CHOLECYSTECTOMY WITH POSSIBLE  INTRAOPERATIVE CHOLANGIOGRAM;  Surgeon: Armandina Gemma, MD;  Location: [facility];  Service: General;  Laterality: N/A;   CYSTOSCOPY N/A 09/11/2021   Procedure: CYSTOSCOPY;  Surgeon: [name], MD;  Location: [facility];  Service: Gynecology;  Laterality: N/A;   DILITATION & CURRETTAGE/HYSTROSCOPY WITH HYDROTHERMAL ABLATION N/A 06/22/2016   Procedure: DILATATION & CURETTAGE/HYSTEROSCOPY WITH HYDROTHERMAL ABLATION;  Surgeon: Shelly Bombard, MD;  Location: [facility];  Service: Gynecology;  Laterality: N/A;   TOTAL LAPAROSCOPIC HYSTERECTOMY WITH SALPINGECTOMY Bilateral 09/11/2021   Procedure: TOTAL LAPAROSCOPIC HYSTERECTOMY WITH SALPINGECTOMY;  Surgeon: [name], MD;  Location: [facility];  Service: Gynecology;  Laterality: Bilateral;   TUBAL LIGATION     09-28-2015    Current Outpatient  Medications  Medication Sig Dispense Refill   acetaminophen (TYLENOL) 500 MG tablet Take 2 tablets (1,000 mg total) by mouth every 6 (six) hours as needed for mild pain. 30 tablet 0   amLODipine (NORVASC) 10 MG tablet Take 1 tablet (10 mg total) by mouth daily. (Patient taking differently: Take 10 mg by mouth at bedtime.) 90 tablet 0   amoxicillin-clavulanate (AUGMENTIN) 875-125 MG tablet Take 1 tablet by mouth 2 (two) times daily. 30 tablet 0   apixaban (ELIQUIS) 5 MG TABS tablet Take 1 tablet (5 mg total) by mouth 2 (two) times daily. 60 tablet 0   APIXABAN (ELIQUIS) VTE STARTER PACK (10MG  AND 5MG ) Take as directed on package: start with two-5mg  tablets twice daily for 7 days. On day 8, switch to one-5mg  tablet twice daily. 1 each 0   betamethasone dipropionate 0.05 % cream Apply 1 application topically daily as needed (itching/ allergic reaction). (Patient not taking: Reported on 11/09/2021)     hydrOXYzine (VISTARIL) 25 MG capsule Take 1 capsule (25 mg total) by mouth every 8 (eight) hours as needed. 30 capsule 0   levocetirizine (XYZAL) 5 MG tablet Take 5 mg by mouth at bedtime.     metFORMIN (GLUCOPHAGE) 500 MG tablet Take 1 tablet (500 mg total) by mouth 2 (two) times daily with a meal. 180 tablet 0   nicotine (NICODERM CQ - DOSED IN MG/24 HOURS) 14 mg/24hr patch Place 1 patch (14 mg total) onto the skin daily. 90 patch 0   ondansetron (ZOFRAN) 4 MG tablet Take 1 tablet (4 mg total) by mouth every 6 (six) hours as needed for nausea or vomiting. 20 tablet 0   Prenatal Vit-Fe Fumarate-FA (PRENATAL MULTIVITAMIN) TABS tablet Take 1 tablet by mouth daily at 12 noon. 90 tablet 0   sertraline (ZOLOFT) 100 MG tablet Take 1 tablet (100 mg total) by mouth at bedtime. 90 tablet 0   No current facility-administered medications for this visit.     ALLERGIES: Tramadol  Family History  Problem Relation Age of Onset   Hypertension Maternal Grandmother    Diabetes Maternal Grandfather    Colon cancer  Neg Hx    Esophageal cancer Neg Hx    Rectal cancer Neg Hx    Stomach cancer Neg Hx     Social History   Socioeconomic History   Marital status: Married    Spouse name: Not on file   Number of children: Not on file   Years of education: Not on file   Highest education level: Not on file  Occupational History   Not on file  Tobacco Use   Smoking status: Former  Packs/day: 0.10    Years: 20.00    Pack years: 2.00    Types: Cigarettes    Quit date: 10/01/2021    Years since quitting: 0.1   Smokeless tobacco: Never  Vaping Use   Vaping Use: Never used  Substance and Sexual Activity   Alcohol use: Not Currently   Drug use: Never   Sexual activity: Not Currently    Partners: Male    Birth control/protection: Surgical    Comment: [place] 09/11/21  Other Topics Concern   Not on file  Social History Narrative   Not on file   Social Determinants of Health   Financial Resource Strain: Not on file  Food Insecurity: Not on file  Transportation Needs: Not on file  Physical Activity: Not on file  Stress: Not on file  Social Connections: Not on file  Intimate Partner Violence: Not on file    Review of Systems  Genitourinary:  Positive for hematuria and vaginal pain (vaginal irritation).  All other systems reviewed and are negative.  PHYSICAL EXAMINATION:    BP 122/80    Pulse 88    Ht 5\' 2"  (1.575 m)    Wt 177 lb (80.3 kg)    LMP 08/18/2021 (Exact Date)    SpO2 97%    BMI 32.37 kg/m     General appearance: alert, cooperative and appears stated age   Abdomen: incisions intact.  Abdomen is protruberant, non-tender, no masses,  no organomegaly  Pelvic: External genitalia:  skin with greyish coloration change, suprapubic incisions are well healed.               Urethra:  normal appearing urethra with no masses, tenderness or lesions.  Sling protected.               Bartholins and Skenes: normal                 Vagina: normal appearing vagina with normal color and discharge,  no lesions              Cervix: absent.  Cuff well healed.                 Bimanual Exam:  Uterus:  absent              Adnexa: no mass, fullness, tenderness         Chaperone was present for exam:  Estill Bamberg, CMA  ASSESSMENT  Status post laparoscopic hysterectomy with bilateral salpingectomy, lysis of adhesions, TVT Exact midurethral sling, cystoscopy.  Post op pelvic abscess treated with IV abx and now Augmentin.  Left sided pain.  I suspect this could be ovulation, constipation, or possibly left renal stone.  Hematuria.  Vulvovaginitis.  Urethral polyps. Post op PE.  On Eliquis.   PLAN  Urinalysis:  sg 1.025, pH 7.0, neg nitrites, 0 - 5 WBC, packed RBC, 10 - 20 epi cells, moderate bacteria, moderate mucus.  UC sent. Will treat with Bactrim DS po bid x 3 days.  Hydrate well.  Will refer to urology due to renal stone, urethral polyps, and hematuria.   Wet prep:  positive yeast, negative clue cells, negative trichomonas. Rx for Diflucan and Mycolog cream.   Return to work on 11/21/21. Ok to take a tub bath.  Nothing per vagina for  3 months post op.  I encouraged patient to reach out to ID to see if the Augmentin may be causing her oral symptoms.   Return for annual exam in 6  months and prn.   An After Visit Summary was printed and given to the patient.  25 min  total time was spent for this patient encounter, including preparation, face-to-face counseling with the patient, coordination of care, and documentation of the encounter.

## 2021-11-09 NOTE — Telephone Encounter (Signed)
Please make a referral to [facility] for kidney stone, blood in urine, and urethral polyps noted at the time of laparoscopic hysterectomy.   First available is fine.

## 2021-11-10 ENCOUNTER — Telehealth: Payer: Self-pay | Admitting: Obstetrics and Gynecology

## 2021-11-10 ENCOUNTER — Encounter: Payer: Self-pay | Admitting: Obstetrics and Gynecology

## 2021-11-10 NOTE — Telephone Encounter (Signed)
Patient request received by our office staff to return to work on 11/16/21.   She will not be able to see urology until February.   I am ok with her returning to work on 11/16/21.  Please confirm this with patient.

## 2021-11-11 LAB — URINE CULTURE
MICRO NUMBER:: 12862802
Result:: NO GROWTH
SPECIMEN QUALITY:: ADEQUATE

## 2021-11-11 LAB — URINALYSIS, COMPLETE W/RFL CULTURE
Bilirubin Urine: NEGATIVE
Casts: NONE SEEN /LPF
Crystals: NONE SEEN /HPF
Glucose, UA: NEGATIVE
Hyaline Cast: NONE SEEN /LPF
Ketones, ur: NEGATIVE
Leukocyte Esterase: NEGATIVE
Nitrites, Initial: NEGATIVE
Specific Gravity, Urine: 1.025 (ref 1.001–1.035)
Yeast: NONE SEEN /HPF
pH: 7 (ref 5.0–8.0)

## 2021-11-11 LAB — CULTURE INDICATED

## 2021-11-14 ENCOUNTER — Encounter: Payer: Self-pay | Admitting: Obstetrics and Gynecology

## 2021-11-15 DIAGNOSIS — F32A Depression, unspecified: Secondary | ICD-10-CM | POA: Insufficient documentation

## 2021-11-15 DIAGNOSIS — D376 Neoplasm of uncertain behavior of liver, gallbladder and bile ducts: Secondary | ICD-10-CM | POA: Insufficient documentation

## 2021-11-15 NOTE — Telephone Encounter (Signed)
Patient contacted [place] through My Chart as she said she spoke with someone at front desk Friday and they were going to fax her return to work letter on Monday but employer does not have it.  I checked with front desk but nobody seemed to be aware of this.   I spoke with patient about this today.  I have provided return to work letter. I do not have a fax# for her employer or FMLA but she said she is going out for another MD visit today and she will run by and pick it up. Letter left at front desk for her to pick up.

## 2021-11-15 NOTE — Telephone Encounter (Signed)
This was ultimately handled through telephone call. Letter to return to work 11/16/21 provided to patient. She will come to office to pick it up today.

## 2021-11-16 ENCOUNTER — Ambulatory Visit: Payer: BC Managed Care – PPO | Admitting: Obstetrics and Gynecology

## 2021-11-16 ENCOUNTER — Other Ambulatory Visit: Payer: Self-pay | Admitting: Nurse Practitioner

## 2021-11-16 DIAGNOSIS — D376 Neoplasm of uncertain behavior of liver, gallbladder and bile ducts: Secondary | ICD-10-CM

## 2021-11-17 ENCOUNTER — Ambulatory Visit (INDEPENDENT_AMBULATORY_CARE_PROVIDER_SITE_OTHER): Payer: BC Managed Care – PPO | Admitting: Infectious Diseases

## 2021-11-17 ENCOUNTER — Encounter: Payer: Self-pay | Admitting: Infectious Diseases

## 2021-11-17 ENCOUNTER — Other Ambulatory Visit: Payer: Self-pay

## 2021-11-17 VITALS — BP 123/82 | HR 106 | Temp 97.6°F | Ht 61.0 in | Wt 195.0 lb

## 2021-11-17 DIAGNOSIS — Z5181 Encounter for therapeutic drug level monitoring: Secondary | ICD-10-CM

## 2021-11-17 DIAGNOSIS — T8143XA Infection following a procedure, organ and space surgical site, initial encounter: Secondary | ICD-10-CM

## 2021-11-17 NOTE — Progress Notes (Signed)
Patient Active Problem List   Diagnosis Date Noted   Smoking 10/26/2021   PICC (peripherally inserted central catheter) in place 10/26/2021   Postoperative intra-abdominal abscess    Medication monitoring encounter    Post-operative infection 10/01/2021   Pelvic infection in female 09/24/2021   Status post laparoscopic hysterectomy 09/11/2021   Cholelithiasis 01/13/2019   Pre-diabetes 11/15/2016   History of bilateral tubal ligation 09/28/2015   Tobacco abuse 09/28/2015   Current Outpatient Medications on File Prior to Visit  Medication Sig Dispense Refill   acetaminophen (TYLENOL) 500 MG tablet Take 2 tablets (1,000 mg total) by mouth every 6 (six) hours as needed for mild pain. 30 tablet 0   amLODipine (NORVASC) 10 MG tablet Take by mouth.     apixaban (ELIQUIS) 5 MG TABS tablet Take 1 tablet (5 mg total) by mouth 2 (two) times daily. 60 tablet 0   APIXABAN (ELIQUIS) VTE STARTER PACK (10MG  AND 5MG ) Take as directed on package: start with two-5mg  tablets twice daily for 7 days. On day 8, switch to one-5mg  tablet twice daily. 1 each 0   betamethasone dipropionate 0.05 % cream Apply 1 application topically daily as needed (itching/ allergic reaction).     hydrOXYzine (VISTARIL) 25 MG capsule Take 1 capsule (25 mg total) by mouth every 8 (eight) hours as needed. 30 capsule 0   levocetirizine (XYZAL) 5 MG tablet Take 5 mg by mouth at bedtime.     metFORMIN (GLUCOPHAGE) 500 MG tablet Take 1 tablet (500 mg total) by mouth 2 (two) times daily with a meal. 180 tablet 0   nicotine (NICODERM CQ - DOSED IN MG/24 HOURS) 14 mg/24hr patch Place 1 patch (14 mg total) onto the skin daily. 90 patch 0   Prenatal Vit-Fe Fumarate-FA (PRENATAL MULTIVITAMIN) TABS tablet Take 1 tablet by mouth daily at 12 noon. 90 tablet 0   sertraline (ZOLOFT) 100 MG tablet Take 1 tablet (100 mg total) by mouth at bedtime. 90 tablet 0   amLODipine (NORVASC) 10 MG tablet Take 1 tablet (10 mg total) by mouth  daily. (Patient taking differently: Take 10 mg by mouth at bedtime.) 90 tablet 0   [DISCONTINUED] dicyclomine (BENTYL) 20 MG tablet Take 1 tablet (20 mg total) by mouth 2 (two) times daily. 20 tablet 0   [DISCONTINUED] omeprazole (PRILOSEC) 20 MG capsule Take 1 capsule (20 mg total) by mouth daily. 30 capsule 1   No current facility-administered medications on file prior to visit.    Subjective: Here for follow up for post operative intraabdominal fluid collection, possibly abscess. Currently taking Augmentin twice a day without missed doses. Denies fevers and chills. Denies abdominal pain/distension.  Denies nausea, vomiting and diarrhea.  Bms regular. She was prescribed 3 days course of Bactrim for concerns of UTI, however UA and urine cx negative. Was also treated for vaginal yeast infection with Diflucan and mycolo cream - symptoms has resolved. Returning back to work next week. She is seeing Urology in February for left renal stone/hematuria. No concerns otherwise.   Review of Systems: ROS 10 point ROS done with pertinent positives and negatives as above   Past Medical History:  Diagnosis Date   Abnormal Pap smear of cervix    Anxiety    Depression    Dysmenorrhea    Eczema    GERD (gastroesophageal reflux disease)    History of COVID-19 10/24/2020   loss of taste and smellbody aches and cough x 4 days  all symptoms resolved   Hypertension    Pre-diabetes    Rash 08/31/2021   comes and goes on legs saw dermetology no rash now ? food allergy   SUI (stress urinary incontinence, female)    Trichomonas infection 02/2021   Uterine fibroid    Past Surgical History:  Procedure Laterality Date   BLADDER SUSPENSION N/A 09/11/2021   Procedure: TRANSVAGINAL TAPE (TVT) PROCEDURE;  Surgeon: [name], MD;  Location: [facility];  Service: Gynecology;  Laterality: N/A;   CESAREAN SECTION  1997   x 1   CHOLECYSTECTOMY N/A 01/14/2019   Procedure: LAPAROSCOPIC  CHOLECYSTECTOMY WITH POSSIBLE  INTRAOPERATIVE CHOLANGIOGRAM;  Surgeon: Armandina Gemma, MD;  Location: [facility];  Service: General;  Laterality: N/A;   CYSTOSCOPY N/A 09/11/2021   Procedure: Consuela Mimes;  Surgeon: [name], MD;  Location: [facility];  Service: Gynecology;  Laterality: N/A;   DILITATION & CURRETTAGE/HYSTROSCOPY WITH HYDROTHERMAL ABLATION N/A 06/22/2016   Procedure: DILATATION & CURETTAGE/HYSTEROSCOPY WITH HYDROTHERMAL ABLATION;  Surgeon: Shelly Bombard, MD;  Location: [facility];  Service: Gynecology;  Laterality: N/A;   TOTAL LAPAROSCOPIC HYSTERECTOMY WITH SALPINGECTOMY Bilateral 09/11/2021   Procedure: TOTAL LAPAROSCOPIC HYSTERECTOMY WITH SALPINGECTOMY;  Surgeon: [name], MD;  Location: [facility];  Service: Gynecology;  Laterality: Bilateral;   TUBAL LIGATION     09-28-2015    Social History   Tobacco Use   Smoking status: Former    Packs/day: 0.10    Years: 20.00    Pack years: 2.00    Types: Cigarettes    Quit date: 10/01/2021    Years since quitting: 0.1   Smokeless tobacco: Never  Vaping Use   Vaping Use: Never used  Substance Use Topics   Alcohol use: Not Currently   Drug use: Never    Family History  Problem Relation Age of Onset   Hypertension Maternal Grandmother    Diabetes Maternal Grandfather    Colon cancer Neg Hx    Esophageal cancer Neg Hx    Rectal cancer Neg Hx    Stomach cancer Neg Hx     Allergies  Allergen Reactions   Tramadol Other (See Comments)    Headache    Health Maintenance  Topic Date Due   COVID-19 Vaccine (4 - Booster for [place] series) 11/24/2020   INFLUENZA VACCINE  05/29/2021   PAP SMEAR-Modifier  03/03/2024   TETANUS/TDAP  11/22/2030   COLONOSCOPY (Pts 45-65yrs Insurance coverage will need to be confirmed)  08/19/2031   Hepatitis C Screening  Completed   HIV Screening  Completed   HPV VACCINES  Aged Out    Objective: BP 123/82    Pulse (!) 106    Temp  97.6 F (36.4 C) (Oral)    Ht 5\' 1"  (1.549 m)    Wt 195 lb (88.5 kg)    LMP 08/18/2021 (Exact Date)    SpO2 97%    BMI 36.84 kg/m    Physical Exam Constitutional:      Appearance: Normal appearance.  HENT:     Head: Normocephalic and atraumatic.      Mouth: Mucous membranes are moist.  Eyes:    Conjunctiva/sclera: Conjunctivae normal.     Pupils: Pupils are equal, round  Cardiovascular:     Rate and Rhythm: Normal rate and regular rhythm.     Heart sounds:  Pulmonary:     Effort: Pulmonary effort is normal.     Breath  sounds:   Abdominal:     General:     Palpations: Abdomen is soft, non tender, BS normal   Musculoskeletal:        General: Normal range of motion.   Skin:    General: Skin is warm and dry.     Comments:  Neurological:     General: No focal deficit present.     Mental Status: She is awake,  alert and oriented to person, place, and time.   Psychiatric:        Mood and Affect: Mood normal.   Lab Results Lab Results  Component Value Date   WBC 10.3 10/03/2021   HGB 10.9 (L) 10/03/2021   HCT 35.1 (L) 10/03/2021   MCV 73.4 (L) 10/03/2021   PLT 395 10/03/2021    Lab Results  Component Value Date   CREATININE 0.67 10/03/2021   BUN 14 10/03/2021   NA 137 10/03/2021   K 4.0 10/03/2021   CL 105 10/03/2021   CO2 24 10/03/2021    Lab Results  Component Value Date   ALT 41 10/03/2021   AST 19 10/03/2021   ALKPHOS 103 10/03/2021   BILITOT 0.3 10/03/2021    Lab Results  Component Value Date   CHOL 205 (H) 12/23/2020   HDL 47 12/23/2020   LDLCALC 142 (H) 12/23/2020   TRIG 90 12/23/2020   CHOLHDL 4.4 12/23/2020   Lab Results  Component Value Date   LABRPR NON-REACTIVE 07/06/2021   No results found for: HIV1RNAQUANT, HIV1RNAVL, CD4TABS  Problem List Items Addressed This Visit       Other   Postoperative intra-abdominal abscess - Primary   Medication monitoring encounter    Assessment/Plan # Complex post operative fluid collection,  possible abscess including left adnexa  -s/p Total laparoscopic hysterectomy with bilateral salpingectomy, lysis of umbilical adhesions, TVT Exact midurethral sling, cystoscopy 09/11/21 - CT abd/pelvis 10/25/21 with improved findings  - DC Augmentin  - Fu 2 weeks or as needed   # Medication Monitoring  - No labs as plan to DC abtx  # Smoking - has quitted  # left renal stone/Hematuria/Urethral Polyps - Fu with Urology   I have personally spent 41 minutes involved in face-to-face and non-face-to-face activities for this patient on the day of the visit including coordination of care and counseling of the patient.   Wilber Oliphant, [facility] 11/17/2021, 2:27 PM

## 2021-11-17 NOTE — Progress Notes (Signed)
Patient ID: Brianna Gutierrez, female    DOB: Mar 17, 1976  MRN: [id number]  CC: Anxiety Depression Follow-Up   Subjective: Brianna Gutierrez is a 46 y.o. female who presents for anxiety depression follow-up.   Her concerns today include: ANXIETY DEPRESSION FOLLOW-UP: 10/24/2021: - Continue Sertraline as prescribed.  - Begin Hydroxyzine as prescribed. Counseled on medication compliance and adverse effects.   11/21/2021: Doing well on current regimen. No issues/concerns. Planning to establish with behavioral health counselor soon.   2. SLEEP STUDY FOLLOW-UP: Reports previous sleep study was denied. Thinks may need a home sleep study instead.   3. INTERNAL HEMORRHOIDS: Reports present for some time. Previously Gastroenterology recommended to [facility]. Patient was not ready at that time. Ready for referral back.   Depression screen [facility] 2/9 11/17/2021 10/24/2021 03/07/2021 02/07/2021 01/23/2021  Decreased Interest 0 0 0 0 0  Down, Depressed, Hopeless 0 0 0 0 0  PHQ - 2 Score 0 0 0 0 0  Altered sleeping - - 0 0 0  Tired, decreased energy - - 0 0 0  Change in appetite - - 0 0 0  Feeling bad or failure about yourself  - - 0 0 0  Trouble concentrating - - 0 0 0  Moving slowly or fidgety/restless - - 0 0 0  Suicidal thoughts - - 0 0 0  PHQ-9 Score - - 0 0 0  Difficult doing work/chores - - Not difficult at all Not difficult at all Not difficult at all    Patient Active Problem List   Diagnosis Date Noted   Smoking 10/26/2021   Postoperative intra-abdominal abscess    Medication monitoring encounter    Pelvic infection in female 09/24/2021   Status post laparoscopic hysterectomy 09/11/2021   Cholelithiasis 01/13/2019   Pre-diabetes 11/15/2016   History of bilateral tubal ligation 09/28/2015   Tobacco abuse 09/28/2015     Current Outpatient Medications on File Prior to Visit  Medication Sig Dispense Refill   acetaminophen (TYLENOL) 500 MG tablet Take 2 tablets (1,000 mg total) by mouth  every 6 (six) hours as needed for mild pain. 30 tablet 0   amLODipine (NORVASC) 10 MG tablet Take by mouth.     apixaban (ELIQUIS) 5 MG TABS tablet Take 1 tablet (5 mg total) by mouth 2 (two) times daily. 60 tablet 0   betamethasone dipropionate 0.05 % cream Apply 1 application topically daily as needed (itching/ allergic reaction).     levocetirizine (XYZAL) 5 MG tablet Take 5 mg by mouth at bedtime.     metFORMIN (GLUCOPHAGE) 500 MG tablet Take 1 tablet (500 mg total) by mouth 2 (two) times daily with a meal. 180 tablet 0   nicotine (NICODERM CQ - DOSED IN MG/24 HOURS) 14 mg/24hr patch Place 1 patch (14 mg total) onto the skin daily. 90 patch 0   Prenatal Vit-Fe Fumarate-FA (PRENATAL MULTIVITAMIN) TABS tablet Take 1 tablet by mouth daily at 12 noon. 90 tablet 0   [DISCONTINUED] dicyclomine (BENTYL) 20 MG tablet Take 1 tablet (20 mg total) by mouth 2 (two) times daily. 20 tablet 0   [DISCONTINUED] omeprazole (PRILOSEC) 20 MG capsule Take 1 capsule (20 mg total) by mouth daily. 30 capsule 1   No current facility-administered medications on file prior to visit.    Allergies  Allergen Reactions   Tramadol Other (See Comments)    Headache    Social History   Socioeconomic History   Marital status: Married    Spouse name: Not on file  Number of children: Not on file   Years of education: Not on file   Highest education level: Not on file  Occupational History   Not on file  Tobacco Use   Smoking status: Former    Packs/day: 0.10    Years: 20.00    Pack years: 2.00    Types: Cigarettes    Quit date: 10/01/2021    Years since quitting: 0.1   Smokeless tobacco: Never  Vaping Use   Vaping Use: Never used  Substance and Sexual Activity   Alcohol use: Not Currently   Drug use: Never   Sexual activity: Not Currently    Partners: Male    Birth control/protection: Surgical    Comment: [place] 09/11/21  Other Topics Concern   Not on file  Social History Narrative   Not on file    Social Determinants of Health   Financial Resource Strain: Not on file  Food Insecurity: Not on file  Transportation Needs: Not on file  Physical Activity: Not on file  Stress: Not on file  Social Connections: Not on file  Intimate Partner Violence: Not on file    Family History  Problem Relation Age of Onset   Hypertension Maternal Grandmother    Diabetes Maternal Grandfather    Colon cancer Neg Hx    Esophageal cancer Neg Hx    Rectal cancer Neg Hx    Stomach cancer Neg Hx     Past Surgical History:  Procedure Laterality Date   BLADDER SUSPENSION N/A 09/11/2021   Procedure: TRANSVAGINAL TAPE (TVT) PROCEDURE;  Surgeon: [name], MD;  Location: [facility];  Service: Gynecology;  Laterality: N/A;   CESAREAN SECTION  1997   x 1   CHOLECYSTECTOMY N/A 01/14/2019   Procedure: LAPAROSCOPIC CHOLECYSTECTOMY WITH POSSIBLE  INTRAOPERATIVE CHOLANGIOGRAM;  Surgeon: Armandina Gemma, MD;  Location: [facility];  Service: General;  Laterality: N/A;   CYSTOSCOPY N/A 09/11/2021   Procedure: Consuela Mimes;  Surgeon: [name], MD;  Location: [facility];  Service: Gynecology;  Laterality: N/A;   DILITATION & CURRETTAGE/HYSTROSCOPY WITH HYDROTHERMAL ABLATION N/A 06/22/2016   Procedure: DILATATION & CURETTAGE/HYSTEROSCOPY WITH HYDROTHERMAL ABLATION;  Surgeon: Shelly Bombard, MD;  Location: [facility];  Service: Gynecology;  Laterality: N/A;   TOTAL LAPAROSCOPIC HYSTERECTOMY WITH SALPINGECTOMY Bilateral 09/11/2021   Procedure: TOTAL LAPAROSCOPIC HYSTERECTOMY WITH SALPINGECTOMY;  Surgeon: [name], MD;  Location: [facility];  Service: Gynecology;  Laterality: Bilateral;   TUBAL LIGATION     09-28-2015    ROS: Review of Systems Negative except as stated above  PHYSICAL EXAM: BP 98/63 (BP Location: Left Arm, Patient Position: Sitting, Cuff Size: Large)    Pulse 63    Temp 98.3 F (36.8 C)    Resp 18    Ht 5'  2.13" (1.578 m)    Wt 193 lb (87.5 kg)    LMP 08/18/2021 (Exact Date)    SpO2 98%    BMI 35.16 kg/m   Physical Exam HENT:     Head: Normocephalic and atraumatic.  Eyes:     Extraocular Movements: Extraocular movements intact.     Conjunctiva/sclera: Conjunctivae normal.     Pupils: Pupils are equal, round, and reactive to light.  Cardiovascular:     Rate and Rhythm: Normal rate and regular rhythm.     Pulses: Normal pulses.     Heart sounds: Normal heart sounds.  Pulmonary:     Effort: Pulmonary  effort is normal.     Breath sounds: Normal breath sounds.  Musculoskeletal:     Cervical back: Normal range of motion and neck supple.  Neurological:     General: No focal deficit present.     Mental Status: She is alert and oriented to person, place, and time.  Psychiatric:        Mood and Affect: Mood normal.        Behavior: Behavior normal.   ASSESSMENT AND PLAN: 1. Anxiety and depression: - Patient denies thoughts of self-harm, suicidal ideations, homicidal ideations. - Continue Sertraline and Hydroxyzine as prescribed.  - Patient planning to establish with behavioral health counselor soon.  - Follow-up with primary provider in 3 months or sooner if needed.  - hydrOXYzine (VISTARIL) 25 MG capsule; Take 1 capsule (25 mg total) by mouth every 8 (eight) hours as needed.  Dispense: 30 capsule; Refill: 2 - sertraline (ZOLOFT) 100 MG tablet; Take 1 tablet (100 mg total) by mouth at bedtime.  Dispense: 90 tablet; Refill: 0  2. Snoring: - Home sleep test for further evaluation. - Home sleep test  3. Internal hemorrhoids: - Referral to Gastroenterology for further evaluation and management.  - Ambulatory referral to Gastroenterology   Patient was given the opportunity to ask questions.  Patient verbalized understanding of the plan and was able to repeat key elements of the plan. Patient was given clear instructions to go to Emergency Department or return to medical center if symptoms  don't improve, worsen, or new problems develop.The patient verbalized understanding.   Orders Placed This Encounter  Procedures   Ambulatory referral to Gastroenterology   Home sleep test     Requested Prescriptions   Signed Prescriptions Disp Refills   hydrOXYzine (VISTARIL) 25 MG capsule 30 capsule 2    Sig: Take 1 capsule (25 mg total) by mouth every 8 (eight) hours as needed.   sertraline (ZOLOFT) 100 MG tablet 90 tablet 0    Sig: Take 1 tablet (100 mg total) by mouth at bedtime.    Follow-up with primary provider as scheduled.  [name], NP

## 2021-11-21 ENCOUNTER — Encounter: Payer: Self-pay | Admitting: Family

## 2021-11-21 ENCOUNTER — Ambulatory Visit: Payer: BC Managed Care – PPO | Admitting: Obstetrics and Gynecology

## 2021-11-21 ENCOUNTER — Ambulatory Visit (INDEPENDENT_AMBULATORY_CARE_PROVIDER_SITE_OTHER): Payer: BC Managed Care – PPO | Admitting: Family

## 2021-11-21 ENCOUNTER — Encounter: Payer: Self-pay | Admitting: Pulmonary Disease

## 2021-11-21 ENCOUNTER — Other Ambulatory Visit: Payer: Self-pay

## 2021-11-21 VITALS — BP 98/63 | HR 63 | Temp 98.3°F | Resp 18 | Ht 62.13 in | Wt 193.0 lb

## 2021-11-21 DIAGNOSIS — F32A Depression, unspecified: Secondary | ICD-10-CM

## 2021-11-21 DIAGNOSIS — R0683 Snoring: Secondary | ICD-10-CM | POA: Diagnosis not present

## 2021-11-21 DIAGNOSIS — K648 Other hemorrhoids: Secondary | ICD-10-CM

## 2021-11-21 DIAGNOSIS — F419 Anxiety disorder, unspecified: Secondary | ICD-10-CM

## 2021-11-21 MED ORDER — HYDROXYZINE PAMOATE 25 MG PO CAPS: 25.0000 mg | ORAL_CAPSULE | Freq: Three times a day (TID) | ORAL | 2 refills | Status: DC | PRN

## 2021-11-21 MED ORDER — SERTRALINE HCL 100 MG PO TABS: 100.0000 mg | ORAL_TABLET | Freq: Every day | ORAL | 0 refills | Status: DC

## 2021-11-21 NOTE — Telephone Encounter (Signed)
Patient scheduled on 12/04/21 @ 1:30pm

## 2021-11-21 NOTE — Telephone Encounter (Signed)
Brianna Gutierrez with [employer] called today because a form she sent has not been returned about patient's return to work date. I called her back and relayed that patient was provided a letter for return to work on 11/16/21.

## 2021-11-21 NOTE — Progress Notes (Signed)
Pt presents for anxiety and depression follow-up, pt unable to complete sleep study ordered due to insurance denial thinks she may need home sleep study

## 2021-11-21 NOTE — Patient Instructions (Signed)
Managing Anxiety, Adult ?After being diagnosed with anxiety, you may be relieved to know why you have felt or behaved a certain way. You may also feel overwhelmed about the treatment ahead and what it will mean for your life. With care and support, you can manage this condition. ?How to manage lifestyle changes ?Managing stress and anxiety ?Stress is your body's reaction to life changes and events, both good and bad. Most stress will last just a few hours, but stress can be ongoing and can lead to more than just stress. Although stress can play a major role in anxiety, it is not the same as anxiety. Stress is usually caused by something external, such as a deadline, test, or competition. Stress normally passes after the triggering event has ended.  ?Anxiety is caused by something internal, such as imagining a terrible outcome or worrying that something will go wrong that will devastate you. Anxiety often does not go away even after the triggering event is over, and it can become long-term (chronic) worry. It is important to understand the differences between stress and anxiety and to manage your stress effectively so that it does not lead to an anxious response. ?Talk with your health care provider or a counselor to learn more about reducing anxiety and stress. He or she may suggest tension reduction techniques, such as: ?Music therapy. Spend time creating or listening to music that you enjoy and that inspires you. ?Mindfulness-based meditation. Practice being aware of your normal breaths while not trying to control your breathing. It can be done while sitting or walking. ?Centering prayer. This involves focusing on a word, phrase, or sacred image that means something to you and brings you peace. ?Deep breathing. To do this, expand your stomach and inhale slowly through your nose. Hold your breath for 3-5 seconds. Then exhale slowly, letting your stomach muscles relax. ?Self-talk. Learn to notice and identify  thought patterns that lead to anxiety reactions and change those patterns to thoughts that feel peaceful. ?Muscle relaxation. Taking time to tense muscles and then relax them. ?Choose a tension reduction technique that fits your lifestyle and personality. These techniques take time and practice. Set aside 5-15 minutes a day to do them. Therapists can offer counseling and training in these techniques. The training to help with anxiety may be covered by some insurance plans. ?Other things you can do to manage stress and anxiety include: ?Keeping a stress diary. This can help you learn what triggers your reaction and then learn ways to manage your response. ?Thinking about how you react to certain situations. You may not be able to control everything, but you can control your response. ?Making time for activities that help you relax and not feeling guilty about spending your time in this way. ?Doing visual imagery. This involves imagining or creating mental pictures to help you relax. ?Practicing yoga. Through yoga poses, you can lower tension and promote relaxation. ? ?Medicines ?Medicines can help ease symptoms. Medicines for anxiety include: ?Antidepressant medicines. These are usually prescribed for long-term daily control. ?Anti-anxiety medicines. These may be added in severe cases, especially when panic attacks occur. ?Medicines will be prescribed by a health care provider. When used together, medicines, psychotherapy, and tension reduction techniques may be the most effective treatment. ?Relationships ?Relationships can play a big part in helping you recover. Try to spend more time connecting with trusted friends and family members. ?Consider going to couples counseling if you have a partner, taking family education classes, or going to family   therapy. ?Therapy can help you and others better understand your condition. ?How to recognize changes in your anxiety ?Everyone responds differently to treatment for  anxiety. Recovery from anxiety happens when symptoms decrease and stop interfering with your daily activities at home or work. This may mean that you will start to: ?Have better concentration and focus. Worry will interfere less in your daily thinking. ?Sleep better. ?Be less irritable. ?Have more energy. ?Have improved memory. ?It is also important to recognize when your condition is getting worse. Contact your health care provider if your symptoms interfere with home or work and you feel like your condition is not improving. ?Follow these instructions at home: ?Activity ?Exercise. Adults should do the following: ?Exercise for at least 150 minutes each week. The exercise should increase your heart rate and make you sweat (moderate-intensity exercise). ?Strengthening exercises at least twice a week. ?Get the right amount and quality of sleep. Most adults need 7-9 hours of sleep each night. ?Lifestyle ? ?Eat a healthy diet that includes plenty of vegetables, fruits, whole grains, low-fat dairy products, and lean protein. ?Do not eat a lot of foods that are high in fats, added sugars, or salt (sodium). ?Make choices that simplify your life. ?Do not use any products that contain nicotine or tobacco. These products include cigarettes, chewing tobacco, and vaping devices, such as e-cigarettes. If you need help quitting, ask your health care provider. ?Avoid caffeine, alcohol, and certain over-the-counter cold medicines. These may make you feel worse. Ask your pharmacist which medicines to avoid. ?General instructions ?Take over-the-counter and prescription medicines only as told by your health care provider. ?Keep all follow-up visits. This is important. ?Where to find support ?You can get help and support from these sources: ?Self-help groups. ?Online and community organizations. ?A trusted spiritual leader. ?Couples counseling. ?Family education classes. ?Family therapy. ?Where to find more information ?You may find  that joining a support group helps you deal with your anxiety. The following sources can help you locate counselors or support groups near you: ?Mental Health America: www.mentalhealthamerica.net ?Anxiety and Depression Association of America (ADAA): www.adaa.org ?National Alliance on Mental Illness (NAMI): www.nami.org ?Contact a health care provider if: ?You have a hard time staying focused or finishing daily tasks. ?You spend many hours a day feeling worried about everyday life. ?You become exhausted by worry. ?You start to have headaches or frequently feel tense. ?You develop chronic nausea or diarrhea. ?Get help right away if: ?You have a racing heart and shortness of breath. ?You have thoughts of hurting yourself or others. ?If you ever feel like you may hurt yourself or others, or have thoughts about taking your own life, get help right away. Go to your nearest emergency department or: ?Call your local emergency services (911 in the [place] ?Call a suicide crisis helpline, such as the National Suicide Prevention Lifeline at 1-800-273-8255 or 988 in the [place] This is open 24 hours a day in the [place] ?Text the Crisis Text Line at 741741 (in the [place] ?Summary ?Taking steps to learn and use tension reduction techniques can help calm you and help prevent triggering an anxiety reaction. ?When used together, medicines, psychotherapy, and tension reduction techniques may be the most effective treatment. ?Family, friends, and partners can play a big part in supporting you. ?This information is not intended to replace advice given to you by your health care provider. Make sure you discuss any questions you have with your health care provider. ?Document Revised: 05/10/2021 Document Reviewed: 02/05/2021 ?Elsevier Patient   Education ? 2022 Elsevier Inc. ? ?

## 2021-11-22 MED ORDER — APIXABAN 5 MG PO TABS: 5.0000 mg | ORAL_TABLET | Freq: Two times a day (BID) | ORAL | 2 refills | Status: DC

## 2021-11-23 ENCOUNTER — Other Ambulatory Visit (HOSPITAL_COMMUNITY): Payer: Self-pay

## 2021-11-23 NOTE — Telephone Encounter (Signed)
If she hasn't already, the pt can go to this website and enroll for a copay card as long as she does not have Medicaid or Medicare:  https://www.eliquis.WebCheating.is  Based off of the quoted copay of $543 (which implies the insurance paid roughly $130), it is safe to assume that the pt has a deductible that will need to be met before her insurance will start paying a higher percentage and she will see prices more like what she paid at the end of 2022. The copay card SHOULD help meet that deductible.

## 2021-11-23 NOTE — Telephone Encounter (Signed)
Is there any assistance for this patient when it comes to her Eliquis? She states that its over $500.

## 2021-11-30 ENCOUNTER — Other Ambulatory Visit: Payer: Self-pay

## 2021-11-30 ENCOUNTER — Ambulatory Visit (INDEPENDENT_AMBULATORY_CARE_PROVIDER_SITE_OTHER): Payer: BC Managed Care – PPO | Admitting: Infectious Diseases

## 2021-11-30 DIAGNOSIS — T8143XA Infection following a procedure, organ and space surgical site, initial encounter: Secondary | ICD-10-CM | POA: Diagnosis not present

## 2021-11-30 DIAGNOSIS — I2699 Other pulmonary embolism without acute cor pulmonale: Secondary | ICD-10-CM | POA: Diagnosis not present

## 2021-11-30 NOTE — Progress Notes (Signed)
Virtual Visit via Telephone Note  I connected withNAME@ on 11/30/21 at 11:15 AM EST by a telephone enabled telemedicine application and verified that I am speaking with the correct person using two identifiers.  Location: Patient: Office Provider: [facility]   I discussed the limitations of evaluation and management by telemedicine and the availability of in person appointments. The patient expressed understanding and agreed to proceed.  [facility]  Patient Active Problem List   Diagnosis Date Noted   Smoking 10/26/2021   Postoperative intra-abdominal abscess    Medication monitoring encounter    Pelvic infection in female 09/24/2021   Status post laparoscopic hysterectomy 09/11/2021   Cholelithiasis 01/13/2019   Pre-diabetes 11/15/2016   History of bilateral tubal ligation 09/28/2015   Tobacco abuse 09/28/2015   Current Outpatient Medications on File Prior to Visit  Medication Sig Dispense Refill   acetaminophen (TYLENOL) 500 MG tablet Take 2 tablets (1,000 mg total) by mouth every 6 (six) hours as needed for mild pain. 30 tablet 0   amLODipine (NORVASC) 10 MG tablet Take by mouth.     apixaban (ELIQUIS) 5 MG TABS tablet Take 1 tablet (5 mg total) by mouth 2 (two) times daily. 60 tablet 2   betamethasone dipropionate 0.05 % cream Apply 1 application topically daily as needed (itching/ allergic reaction).     hydrOXYzine (VISTARIL) 25 MG capsule Take 1 capsule (25 mg total) by mouth every 8 (eight) hours as needed. 30 capsule 2   levocetirizine (XYZAL) 5 MG tablet Take 5 mg by mouth at bedtime.     metFORMIN (GLUCOPHAGE) 500 MG tablet Take 1 tablet (500 mg total) by mouth 2 (two) times daily with a meal. 180 tablet 0   sertraline (ZOLOFT) 100 MG tablet Take 1 tablet (100 mg total) by mouth at bedtime. 90 tablet 0   [DISCONTINUED] dicyclomine (BENTYL) 20 MG tablet Take 1 tablet (20 mg total) by mouth 2 (two) times daily. 20 tablet 0   [DISCONTINUED] omeprazole  (PRILOSEC) 20 MG capsule Take 1 capsule (20 mg total) by mouth daily. 30 capsule 1   No current facility-administered medications on file prior to visit.    History of Present Illness: Here for phone visit for post op fluid collection after discontinuing antibiotics. Doing well. Denies fevers, chills, sweats. Denies nausea, vomiting, diarrhea. Appetite is good. She is back to work. She has a fu scheduled with urology later this month. No concerns otherwise.  ROS all systems reviewed and negative except as stated above  Past Medical History:  Diagnosis Date   Abnormal Pap smear of cervix    Anxiety    Depression    Dysmenorrhea    Eczema    GERD (gastroesophageal reflux disease)    History of COVID-19 10/24/2020   loss of taste and smellbody aches and cough x 4 days all symptoms resolved   Hypertension    Pre-diabetes    Rash 08/31/2021   comes and goes on legs saw dermetology no rash now ? food allergy   SUI (stress urinary incontinence, female)    Trichomonas infection 02/2021   Uterine fibroid    Past Surgical History:  Procedure Laterality Date   BLADDER SUSPENSION N/A 09/11/2021   Procedure: TRANSVAGINAL TAPE (TVT) PROCEDURE;  Surgeon: [name], MD;  Location: [facility];  Service: Gynecology;  Laterality: N/A;   CESAREAN SECTION  1997   x 1   CHOLECYSTECTOMY N/A 01/14/2019   Procedure: LAPAROSCOPIC CHOLECYSTECTOMY WITH POSSIBLE  INTRAOPERATIVE CHOLANGIOGRAM;  Surgeon: Armandina Gemma, MD;  Location: [facility];  Service: General;  Laterality: N/A;   CYSTOSCOPY N/A 09/11/2021   Procedure: Consuela Mimes;  Surgeon: [name], MD;  [facility]  Service: Gynecology;  Laterality: N/A;   DILITATION & CURRETTAGE/HYSTROSCOPY WITH HYDROTHERMAL ABLATION N/A 06/22/2016   Procedure: DILATATION & CURETTAGE/HYSTEROSCOPY WITH HYDROTHERMAL ABLATION;  Surgeon: Shelly Bombard, MD;  Location: [facility];  Service: Gynecology;   Laterality: N/A;   TOTAL LAPAROSCOPIC HYSTERECTOMY WITH SALPINGECTOMY Bilateral 09/11/2021   Procedure: TOTAL LAPAROSCOPIC HYSTERECTOMY WITH SALPINGECTOMY;  Surgeon: [name], MD;  Location: [facility];  Service: Gynecology;  Laterality: Bilateral;   TUBAL LIGATION     09-28-2015    Social History   Tobacco Use   Smoking status: Former    Packs/day: 0.10    Years: 20.00    Pack years: 2.00    Types: Cigarettes    Quit date: 10/01/2021    Years since quitting: 0.1   Smokeless tobacco: Never  Vaping Use   Vaping Use: Never used  Substance Use Topics   Alcohol use: Not Currently   Drug use: Never    Family History  Problem Relation Age of Onset   Hypertension Maternal Grandmother    Diabetes Maternal Grandfather    Colon cancer Neg Hx    Esophageal cancer Neg Hx    Rectal cancer Neg Hx    Stomach cancer Neg Hx     Allergies  Allergen Reactions   Tramadol Other (See Comments)    Headache    Health Maintenance  Topic Date Due   COVID-19 Vaccine (4 - Booster for [place] series) 11/24/2020   INFLUENZA VACCINE  05/29/2021   PAP SMEAR-Modifier  03/03/2024   TETANUS/TDAP  11/22/2030   COLONOSCOPY (Pts 45-32yrs Insurance coverage will need to be confirmed)  08/19/2031   Hepatitis C Screening  Completed   HIV Screening  Completed   HPV VACCINES  Aged Out    Observations/Objective:   Assessment and Plan: # Complex post operative fluid collection, possible abscess including left adnexa  -s/p Total laparoscopic hysterectomy with bilateral salpingectomy, lysis of umbilical adhesions, TVT Exact midurethral sling, cystoscopy 09/11/21 - CT abd/pelvis 10/25/21 with improved findings  - Off antibiotics since 1/20 and doing well   # left renal stone/Hematuria/Urethral Polyps - Fu with Urology   # Pulmonary Embolus - On eliquis - She is going to follow up with PCP for duration of anticoagulant   Follow Up Instructions: Fu as needed    I  discussed the assessment and treatment plan with the patient. The patient was provided an opportunity to ask questions and all were answered. The patient agreed with the plan and demonstrated an understanding of the instructions.   The patient was advised to call back or seek an in-person evaluation if the symptoms worsen or if the condition fails to improve as anticipated.  I provided 25 minutes of non-face-to-face time during this encounter.  Wilber Oliphant, [facility] Phone [phone] Fax no. [phone]  11/30/2021, 11:28 AM

## 2021-12-04 DIAGNOSIS — R310 Gross hematuria: Secondary | ICD-10-CM | POA: Diagnosis not present

## 2021-12-04 DIAGNOSIS — N281 Cyst of kidney, acquired: Secondary | ICD-10-CM | POA: Diagnosis not present

## 2021-12-04 DIAGNOSIS — N200 Calculus of kidney: Secondary | ICD-10-CM | POA: Diagnosis not present

## 2021-12-08 ENCOUNTER — Other Ambulatory Visit: Payer: Self-pay

## 2021-12-08 ENCOUNTER — Ambulatory Visit
Admission: RE | Admit: 2021-12-08 | Discharge: 2021-12-08 | Disposition: A | Discharge status: Home / Self Care | Payer: BC Managed Care – PPO | Source: Ambulatory Visit | Attending: Nurse Practitioner | Admitting: Nurse Practitioner

## 2021-12-08 DIAGNOSIS — N281 Cyst of kidney, acquired: Secondary | ICD-10-CM | POA: Diagnosis not present

## 2021-12-08 DIAGNOSIS — E278 Other specified disorders of adrenal gland: Secondary | ICD-10-CM | POA: Diagnosis not present

## 2021-12-08 DIAGNOSIS — D376 Neoplasm of uncertain behavior of liver, gallbladder and bile ducts: Secondary | ICD-10-CM

## 2021-12-08 DIAGNOSIS — N2889 Other specified disorders of kidney and ureter: Secondary | ICD-10-CM | POA: Diagnosis not present

## 2021-12-08 DIAGNOSIS — N261 Atrophy of kidney (terminal): Secondary | ICD-10-CM | POA: Diagnosis not present

## 2021-12-08 MED ORDER — GADOBENATE DIMEGLUMINE 529 MG/ML IV SOLN
18.0000 mL | Freq: Once | INTRAVENOUS | Status: AC | PRN
  Administered 2021-12-08: 18 mL via INTRAVENOUS

## 2021-12-10 ENCOUNTER — Encounter: Payer: Self-pay | Admitting: Family

## 2021-12-16 ENCOUNTER — Telehealth: Payer: Self-pay | Admitting: *Deleted

## 2021-12-16 NOTE — Telephone Encounter (Signed)
Following up on home sleep study request. Please return call for update on status of referral.

## 2021-12-19 ENCOUNTER — Telehealth: Payer: Self-pay | Admitting: Family

## 2021-12-19 ENCOUNTER — Other Ambulatory Visit: Payer: Self-pay | Admitting: Family

## 2021-12-19 DIAGNOSIS — Z716 Tobacco abuse counseling: Secondary | ICD-10-CM

## 2021-12-19 DIAGNOSIS — R0683 Snoring: Secondary | ICD-10-CM

## 2021-12-19 MED ORDER — NICOTINE 14 MG/24HR TD PT24: 14.0000 mg | MEDICATED_PATCH | Freq: Every day | TRANSDERMAL | 2 refills | Status: DC

## 2021-12-25 DIAGNOSIS — R310 Gross hematuria: Secondary | ICD-10-CM | POA: Diagnosis not present

## 2021-12-25 DIAGNOSIS — N200 Calculus of kidney: Secondary | ICD-10-CM | POA: Diagnosis not present

## 2021-12-28 ENCOUNTER — Telehealth: Payer: Self-pay

## 2021-12-28 NOTE — Telephone Encounter (Signed)
[employer] called in regards to the patient they faxed over a form that needs to be filled out urgently, did ask for a call back to verify if we have received the forms.  ?

## 2021-12-29 NOTE — Telephone Encounter (Signed)
Paperwork needs to be forwarded to pt gyn  ?

## 2022-01-05 ENCOUNTER — Ambulatory Visit (INDEPENDENT_AMBULATORY_CARE_PROVIDER_SITE_OTHER): Payer: BC Managed Care – PPO | Admitting: Pulmonary Disease

## 2022-01-05 ENCOUNTER — Encounter: Payer: Self-pay | Admitting: Pulmonary Disease

## 2022-01-05 ENCOUNTER — Other Ambulatory Visit: Payer: Self-pay

## 2022-01-05 VITALS — BP 130/68 | HR 88 | Temp 98.2°F | Ht 61.0 in | Wt 199.4 lb

## 2022-01-05 DIAGNOSIS — R0609 Other forms of dyspnea: Secondary | ICD-10-CM

## 2022-01-05 DIAGNOSIS — I2699 Other pulmonary embolism without acute cor pulmonale: Secondary | ICD-10-CM

## 2022-01-05 LAB — PULMONARY FUNCTION TEST
DL/VA % pred: 124 %
DL/VA: 5.57 ml/min/mmHg/L
DLCO cor % pred: 94 %
DLCO cor: 18.33 ml/min/mmHg
DLCO unc % pred: 94 %
DLCO unc: 18.33 ml/min/mmHg
FEF 25-75 Post: 2.31 L/sec
FEF 25-75 Pre: 2.15 L/sec
FEF2575-%Change-Post: 7 %
FEF2575-%Pred-Post: 94 %
FEF2575-%Pred-Pre: 88 %
FEV1-%Change-Post: 2 %
FEV1-%Pred-Post: 83 %
FEV1-%Pred-Pre: 81 %
FEV1-Post: 1.83 L
FEV1-Pre: 1.78 L
FEV1FVC-%Change-Post: 3 %
FEV1FVC-%Pred-Pre: 103 %
FEV6-%Change-Post: 0 %
FEV6-%Pred-Post: 79 %
FEV6-%Pred-Pre: 80 %
FEV6-Post: 2.06 L
FEV6-Pre: 2.08 L
FEV6FVC-%Pred-Post: 102 %
FEV6FVC-%Pred-Pre: 102 %
FVC-%Change-Post: 0 %
FVC-%Pred-Post: 77 %
FVC-%Pred-Pre: 77 %
FVC-Post: 2.06 L
FVC-Pre: 2.08 L
Post FEV1/FVC ratio: 89 %
Post FEV6/FVC ratio: 100 %
Pre FEV1/FVC ratio: 85 %
Pre FEV6/FVC Ratio: 100 %
RV % pred: 88 %
RV: 1.36 L
TLC % pred: 76 %
TLC: 3.51 L

## 2022-01-05 NOTE — Patient Instructions (Addendum)
Nice to see you again ? ?Ok to stop the Eliquis ? ?Let me know If you need assistance with the home sleep study - Call Durene Fruits, NP office and ask what the hold up is. Our office is 3-4 months behind on scheduling.  ? ?Return to clinic as needed ?

## 2022-01-05 NOTE — Progress Notes (Signed)
PFT done today. 

## 2022-01-05 NOTE — Progress Notes (Signed)
$'@Patient'a$  ID: Brianna Gutierrez, female    DOB: Jul 15, 1976, 46 y.o.   MRN: [id number]  Chief Complaint  Patient presents with   Follow-up    Follow up with pfts. Pt wants to ask MH about a sleep study her  PCP ordered one and nothing has been told to her.     Referring provider: [name], NP  HPI:   Brianna Gutierrez whom we are seeing in follow up for evaluation of acute pulmonary embolus provoked in setting of hysterectomy.  Returns to clinic for routine follow-up.  PFTs today.  3 months out from provoked PE.  PFTs today essentially normal, mildly reduced DLCO severely reduced ERV likely reflective extraparenchymal restriction from habitus.  DLCO within normal limits reassuring against fibrosis or residual PE.  Symptoms gradually improving, dyspnea gradually improving with gradual increase in activity.  HPI at initial visit: Patient had hysterectomy 08/2021.  Represented to hospital with abdominal pain.  Concern for infected fluid collection.  Incidentally discovered right lower lobe PE on CT scan 09/27/2021 during second hospitalization.  Placed on heparin.  Subsequently placed on Eliquis.  She had TTE shortly after PE diagnosis that showed normal RV function, RA size, no evidence of elevated RVSP or PASP.  Her CT scan there showed a clear lungs in the bases on my review interpretation.  Most recent chest x-ray 09/2021 with linear infiltrates in bilateral bases consistent with atelectasis on my review interpretation.  Overall, improving.  Abdominal pain better after multiple rounds of antibiotics.  Continues Augmentin.  Endorses fatigue overall.  Some soreness has developed.  Polysomnography has been ordered by other provider.  Feels little short of breath particularly inclines or stairs.  Nothing else makes things better or worse.  No position, no time of day, no seasonal environmental factors she can identify that could contribute.  PMH: Hypertension, seasonal allergies, sleep  apnea Surgical history: Cholecystectomy, hysterectomy, tubal ligation Family history: Reviewed, denies significant respiratory illness in first-degree relatives Social history: Former smoker, lives in [place] / Pulmonary Flowsheets:   ACT:  No flowsheet data found.  MMRC: No flowsheet data found.  Epworth:  No flowsheet data found.  Tests:   FENO:  No results found for: NITRICOXIDE  PFT: PFT Results Latest Ref Rng & Units 01/05/2022  FVC-Pre L 2.08  FVC-Predicted Pre % 77  FVC-Post L 2.06  FVC-Predicted Post % 77  Pre FEV1/FVC % % Brianna  Post FEV1/FCV % % 89  FEV1-Pre L 1.78  FEV1-Predicted Pre % 81  FEV1-Post L 1.83  DLCO uncorrected ml/min/mmHg 18.33  DLCO UNC% % 94  DLCO corrected ml/min/mmHg 18.33  DLCO COR %Predicted % 94  DLVA Predicted % 124  TLC L 3.51  TLC % Predicted % 76  RV % Predicted % 88  Personally reviewed interpreted as normal spirometry, no bronchodilator spots, TLC mildly reduced just below lower lobe normal, ERV severely reduced reflective of restriction from extraparenchymal causes, body habitus, DLCO within normal limits.  WALK:  No flowsheet data found.  Imaging: Personally reviewed and as per EMR discussion this note MR ABDOMEN WWO CONTRAST  Result Date: 12/10/2021 CLINICAL DATA:  Follow-up hepatic, right renal and adrenal lesions seen on prior imaging EXAM: MRI ABDOMEN WITHOUT AND WITH CONTRAST TECHNIQUE: Multiplanar multisequence MR imaging of the abdomen was performed both before and after the administration of intravenous contrast. CONTRAST:  42m MULTIHANCE GADOBENATE DIMEGLUMINE 529 MG/ML IV SOLN COMPARISON:  Multiple priors including most recent CT October 25, 2021 FINDINGS:  Despite efforts by the technologist and patient, motion artifact is present on today's exam and could not be eliminated. This reduces exam sensitivity and specificity. Lower chest: No acute abnormality. Hepatobiliary: Diffuse hepatic steatosis.  Well-circumscribed segment II hepatic lesion measures 15 x 13 mm on image 13/4 demonstrating intrinsic T2 hyperintensity with enhancing internal septations some of which are minimally thickened but all of which appear to arise from macrolobulations, without discrete enhancing soft tissue nodularity. Gallbladder surgically absent.  No biliary ductal dilation. Pancreas: Intrinsic T1 signal of the pancreatic parenchyma is within normal limits. No pancreatic ductal dilation. No cystic or solid enhancing pancreatic lesion identified. Spleen: 12 mm soft tissue density inferior to the spleen and just cranial to the upper pole left kidney, but which is discrete from both the kidney and spleen and follows spleen on all imaging sequences, consistent with a benign splenule. Normal size spleen. Adrenals/Urinary Tract: Well-circumscribed fluid signal nonenhancing 10 mm lesion extending from the posterior right adrenal gland on image 20/4 is consistent with a benign adrenal cyst. Left adrenal gland appears normal. No hydronephrosis. Right renal atrophy with cortical renal scarring. Right upper pole 11 mm well-circumscribed mildly T2 hyperintense slightly T1 hyperintense nonenhancing lesion on image 23/4 is consistent with a benign hemorrhagic/proteinaceous cyst. No solid enhancing renal mass. Stomach/Bowel: Visualized portions within the abdomen are unremarkable. Vascular/Lymphatic: No pathologically enlarged lymph nodes identified. No abdominal aortic aneurysm demonstrated. Other: No abdominal ascites. Cystic left adnexal lesion is partially visualized and incompletely evaluated but appears similar in size dating back to October 25, 2021 measuring 3.7 cm on image 36/20. Musculoskeletal: No suspicious bone lesions identified. IMPRESSION: Motion degraded examination.  Within this context: 1. Well-circumscribed segment II hepatic lesion with enhancing internal septations some of which are minimally thickened but all of which  appear to arise from macrolobulations, without discrete enhancing soft tissue nodularity. This is most consistent with a benign complex hepatic cyst with less likely differential consideration of a biliary cystadenoma. Consider follow-up hepatic protocol MRI with and without contrast in 6-12 months to assess stability. 2. Hemorrhagic/proteinaceous 11 mm right upper pole renal cyst corresponding with the finding on prior CT. 3. Small benign right adrenal cyst, corresponding with the finding on prior CT. 4. Diffuse hepatic steatosis. 5. Cystic left adnexal lesion is partially visualized and incompletely evaluated but appears similar in size dating back to October 25, 2021 measuring 3.7 cm. Electronically Signed   By: Dahlia Bailiff M.D.   On: 12/10/2021 12:35    Lab Results: Personally reviewed: CBC    Component Value Date/Time   WBC 10.3 10/03/2021 0431   RBC 4.78 10/03/2021 0431   HGB 10.9 (L) 10/03/2021 0431   HGB 13.6 12/23/2020 1528   HCT 35.1 (L) 10/03/2021 0431   HCT 43.5 12/23/2020 1528   PLT 395 10/03/2021 0431   PLT 375 12/23/2020 1528   MCV 73.4 (L) 10/03/2021 0431   MCV 73 (L) 12/23/2020 1528   MCH 22.8 (L) 10/03/2021 0431   MCHC 31.1 10/03/2021 0431   RDW 15.7 (H) 10/03/2021 0431   RDW 14.8 12/23/2020 1528   LYMPHSABS 1.7 10/03/2021 0431   LYMPHSABS 1.4 12/30/2018 1648   MONOABS 1.0 10/03/2021 0431   EOSABS 0.8 (H) 10/03/2021 0431   EOSABS 0.2 12/30/2018 1648   BASOSABS 0.0 10/03/2021 0431   BASOSABS 0.0 12/30/2018 1648    BMET    Component Value Date/Time   NA 137 10/03/2021 0431   NA 136 11/22/2020 1700   K 4.0 10/03/2021 0431  CL 105 10/03/2021 0431   CO2 24 10/03/2021 0431   GLUCOSE 97 10/03/2021 0431   BUN 14 10/03/2021 0431   BUN 11 11/22/2020 1700   CREATININE 0.67 10/03/2021 0431   CREATININE 0.62 11/22/2015 1537   CALCIUM 8.9 10/03/2021 0431   GFRNONAA >60 10/03/2021 0431   GFRNONAA >89 11/22/2015 1537   GFRAA 107 11/22/2020 1700   GFRAA >89  11/22/2015 1537    BNP    Component Value Date/Time   BNP 248.2 (H) 09/27/2021 1851    ProBNP    Component Value Date/Time   PROBNP 7.2 11/23/2010 2043    Specialty Problems   None   Allergies  Allergen Reactions   Tramadol Other (See Comments)    Headache    Immunization History  Administered Date(s) Administered   Influenza,inj,Quad PF,6+ Mos 09/26/2016, 11/22/2020   PFIZER(Purple Top)SARS-COV-2 Vaccination 01/10/2020, 02/07/2020, 09/29/2020   Tdap 11/22/2020    Past Medical History:  Diagnosis Date   Abnormal Pap smear of cervix    Anxiety    Depression    Dysmenorrhea    Eczema    GERD (gastroesophageal reflux disease)    History of COVID-19 10/24/2020   loss of taste and smellbody aches and cough x 4 days all symptoms resolved   Hypertension    Pre-diabetes    Rash 08/31/2021   comes and goes on legs saw dermetology no rash now ? food allergy   SUI (stress urinary incontinence, female)    Trichomonas infection 02/2021   Uterine fibroid     Tobacco History: Social History   Tobacco Use  Smoking Status Former   Packs/day: 0.10   Years: 20.00   Pack years: 2.00   Types: Cigarettes   Quit date: 10/01/2021   Years since quitting: 0.2  Smokeless Tobacco Never   Counseling given: Not Answered   Continue to not smoke  Outpatient Encounter Medications as of 01/05/2022  Medication Sig   acetaminophen (TYLENOL) 500 MG tablet Take 2 tablets (1,000 mg total) by mouth every 6 (six) hours as needed for mild pain.   amLODipine (NORVASC) 10 MG tablet Take by mouth.   apixaban (ELIQUIS) 5 MG TABS tablet Take 1 tablet (5 mg total) by mouth 2 (two) times daily.   betamethasone dipropionate 0.05 % cream Apply 1 application topically daily as needed (itching/ allergic reaction).   hydrOXYzine (VISTARIL) 25 MG capsule Take 1 capsule (25 mg total) by mouth every 8 (eight) hours as needed.   levocetirizine (XYZAL) 5 MG tablet Take 5 mg by mouth at bedtime.    nicotine (NICODERM CQ - DOSED IN MG/24 HOURS) 14 mg/24hr patch Place 1 patch (14 mg total) onto the skin daily.   sertraline (ZOLOFT) 100 MG tablet Take 1 tablet (100 mg total) by mouth at bedtime.   metFORMIN (GLUCOPHAGE) 500 MG tablet Take 1 tablet (500 mg total) by mouth 2 (two) times daily with a meal.   [DISCONTINUED] dicyclomine (BENTYL) 20 MG tablet Take 1 tablet (20 mg total) by mouth 2 (two) times daily.   [DISCONTINUED] omeprazole (PRILOSEC) 20 MG capsule Take 1 capsule (20 mg total) by mouth daily.   No facility-administered encounter medications on file as of 01/05/2022.     Review of Systems  Review of Systems  N/a Physical Exam  BP 130/68 (BP Location: Left Arm, Patient Position: Sitting, Cuff Size: Normal)    Pulse 88    Temp 98.2 F (36.8 C) (Oral)    Ht '5\' 1"'$  (1.549 m)  Wt 199 lb 6.4 oz (90.4 kg)    LMP 08/18/2021 (Exact Date)    SpO2 97%    BMI 37.68 kg/m   Wt Readings from Last 5 Encounters:  01/05/22 199 lb 6.4 oz (90.4 kg)  11/21/21 193 lb (87.5 kg)  11/17/21 195 lb (88.5 kg)  11/09/21 177 lb (80.3 kg)  11/07/21 197 lb 6.4 oz (89.5 kg)    BMI Readings from Last 5 Encounters:  01/05/22 37.68 kg/m  11/21/21 35.16 kg/m  11/17/21 36.84 kg/m  11/09/21 32.37 kg/m  11/07/21 36.10 kg/m     Physical Exam General: Well-appearing, no acute distress Eyes: EOMI, icterus Neck: Supple, no JVP Pulmonary: Clear, normal work of breathing Cardiovascular: Regular rhythm, no murmur MSK: No synovitis, no joint effusions Skin: Easy bruising throughout Neuro: Normal gait, no weakness Psych: Normal mood, full affect   Assessment & Plan:   Provoked pulmonary embolus following hysterectomy: Has completed 3+ months of anticoagulation.  Symptoms of dyspnea overall improving.  Stop Eliquis today.  Dyspnea on exertion: Likely deconditioning in setting of her multiple hospitalizations over the last couple months.  PFTs also suggestive of extraparenchymal restriction  with normal DLCO likely reflective of habitus.  Encouraged increased exercise, increased activity.   Return if symptoms worsen or fail to improve.   Lanier Clam, MD 01/05/2022

## 2022-01-11 NOTE — Telephone Encounter (Signed)
Brianna Gutierrez, I am following up on this telephone call. ?

## 2022-01-15 DIAGNOSIS — N200 Calculus of kidney: Secondary | ICD-10-CM | POA: Diagnosis not present

## 2022-01-17 ENCOUNTER — Other Ambulatory Visit: Payer: Self-pay | Admitting: Urology

## 2022-01-23 NOTE — Patient Instructions (Addendum)
DUE TO COVID-19 ONLY ONE VISITOR  (aged 46 and older)  IS ALLOWED TO COME WITH YOU AND STAY IN THE WAITING ROOM ONLY DURING PRE OP AND PROCEDURE.   ?**NO VISITORS ARE ALLOWED IN THE SHORT STAY AREA OR RECOVERY ROOM!!** ? ?IF YOU WILL BE ADMITTED INTO THE HOSPITAL YOU ARE ALLOWED ONLY TWO SUPPORT PEOPLE DURING VISITATION HOURS ONLY (7 AM -8PM)   ?The support person(s) must pass our screening, gel in and out, and wear a mask at all times, including in the patient?s room. ?Patients must also wear a mask when staff or their support person are in the room. ?Visitors GUEST BADGE MUST BE WORN VISIBLY  ?One adult visitor may remain with you overnight and MUST be in the room by 8 P.M. ?  ? ? Your procedure is scheduled on: 01/31/22 ? ? Report to [facility] Main Entrance ? ?  Report to admitting at 7:30 AM ? ? Call this number if you have problems the morning of surgery [phone] ? ? Do not eat food :or drink After Midnight. ? ?  ? ?  ?       If you have questions, please contact your surgeon?s office. ? ? ?  ?  ?Oral Hygiene is also important to reduce your risk of infection.                                    ?Remember - BRUSH YOUR TEETH THE MORNING OF SURGERY WITH YOUR REGULAR TOOTHPASTE ? ? ? Take these medicines the morning of surgery with A SIP OF WATER: Zoloft, Amlodipine ? ?DO NOT TAKE ANY ORAL DIABETIC MEDICATIONS DAY OF YOUR SURGERY metformin ? ?                  ?           You may not have any metal on your body including hair pins, jewelry, and body piercing ? ?           Do not wear make-up, lotions, powders, perfumes/cologne, or deodorant ? ?Do not wear nail polish including gel and S&S, artificial/acrylic nails, or any other type of covering on natural nails including finger and toenails. If you have artificial nails, gel coating, etc. that needs to be removed by a nail salon please have this removed prior to surgery or surgery may need to be canceled/ delayed if the surgeon/ anesthesia feels like  they are unable to be safely monitored.  ? ?Do not shave  48 hours prior to surgery.  ?. ? ? Do not bring valuables to the hospital. [place] NOT ?            RESPONSIBLE   FOR VALUABLES. ? ? Contacts, dentures or bridgework may not be worn into surgery. ? ?  ? Patients discharged on the day of surgery will not be allowed to drive home.  Someone NEEDS to stay with you for the first 24 hours after anesthesia. ? ? Special Instructions: Bring a copy of your healthcare power of attorney and living will documents the day of surgery if you haven't scanned them before. ? ?            Please read over the following fact sheets you were given: IF [place] [phone] ? ?   [facility] - Preparing for Surgery ?Before surgery,  you can play an important role.  Because skin is not sterile, your skin needs to be as free of germs as possible.  You can reduce the number of germs on your skin by washing with CHG (chlorahexidine gluconate) soap before surgery.  CHG is an antiseptic cleaner which kills germs and bonds with the skin to continue killing germs even after washing. ?Please DO NOT use if you have an allergy to CHG or antibacterial soaps.  If your skin becomes reddened/irritated stop using the CHG and inform your nurse when you arrive at Short Stay. ?Do not shave (including legs and underarms) for at least 48 hours prior to the first CHG shower.   ?Please follow these instructions carefully: ? 1.  Shower with CHG Soap the night before surgery and the  morning of Surgery. ? 2.  If you choose to wash your hair, wash your hair first as usual with your  normal  shampoo. ? 3.  After you shampoo, rinse your hair and body thoroughly to remove the  shampoo.                       ?     4.  Use CHG as you would any other liquid soap.  You can apply chg directly  to the skin and wash  ?                     Gently with a scrungie or clean washcloth. ? 5.  Apply the CHG Soap to  your body ONLY FROM THE NECK DOWN.   Do not use on face/ open      ?                     Wound or open sores. Avoid contact with eyes, ears mouth and genitals (private parts).  ?                     Production manager,  Genitals (private parts) with your normal soap. ?            6.  Wash thoroughly, paying special attention to the area where your surgery  will be performed. ? 7.  Thoroughly rinse your body with warm water from the neck down. ? 8.  DO NOT shower/wash with your normal soap after using and rinsing off  the CHG Soap. ?               9.  Pat yourself dry with a clean towel. ?           10.  Wear clean pajamas. ?           11.  Place clean sheets on your bed the night of your first shower and do not  sleep with pets. ?Day of Surgery : ?Do not apply any lotions/deodorants the morning of surgery.  Please wear clean clothes to the hospital/surgery center. ? ?FAILURE TO FOLLOW THESE INSTRUCTIONS MAY RESULT IN THE CANCELLATION OF YOUR SURGERY ? ? ?________________________________________________________________________  ?

## 2022-01-24 ENCOUNTER — Encounter (HOSPITAL_COMMUNITY): Payer: Self-pay

## 2022-01-24 ENCOUNTER — Other Ambulatory Visit: Payer: Self-pay

## 2022-01-24 ENCOUNTER — Encounter (HOSPITAL_COMMUNITY)
Admission: RE | Admit: 2022-01-24 | Discharge: 2022-01-24 | Disposition: A | Discharge status: Home / Self Care | Payer: BC Managed Care – PPO | Source: Ambulatory Visit | Attending: Urology | Admitting: Urology

## 2022-01-24 VITALS — BP 129/56 | HR 102 | Temp 98.7°F | Resp 16 | Ht 61.0 in | Wt 196.0 lb

## 2022-01-24 DIAGNOSIS — Z01812 Encounter for preprocedural laboratory examination: Secondary | ICD-10-CM | POA: Insufficient documentation

## 2022-01-24 DIAGNOSIS — R7303 Prediabetes: Secondary | ICD-10-CM | POA: Insufficient documentation

## 2022-01-24 HISTORY — DX: Personal history of urinary calculi: Z87.442

## 2022-01-24 LAB — BASIC METABOLIC PANEL
Anion gap: 8 (ref 5–15)
BUN: 11 mg/dL (ref 6–20)
CO2: 21 mmol/L — ABNORMAL LOW (ref 22–32)
Calcium: 8.9 mg/dL (ref 8.9–10.3)
Chloride: 108 mmol/L (ref 98–111)
Creatinine, Ser: 0.74 mg/dL (ref 0.44–1.00)
GFR, Estimated: >60 mL/min (ref >60)
Glucose, Bld: 102 mg/dL — ABNORMAL HIGH (ref 70–99)
Potassium: 4.1 mmol/L (ref 3.5–5.1)
Sodium: 137 mmol/L (ref 135–145)

## 2022-01-24 LAB — CBC
HCT: 43.4 % (ref 36.0–46.0)
Hemoglobin: 13.3 g/dL (ref 12.0–15.0)
MCH: 22.1 pg — ABNORMAL LOW (ref 26.0–34.0)
MCHC: 30.6 g/dL (ref 30.0–36.0)
MCV: 72.2 fL — ABNORMAL LOW (ref 80.0–100.0)
Platelets: 443 10*3/uL — ABNORMAL HIGH (ref 150–400)
RBC: 6.01 MIL/uL — ABNORMAL HIGH (ref 3.87–5.11)
RDW: 17.1 % — ABNORMAL HIGH (ref 11.5–15.5)
WBC: 6.3 10*3/uL (ref 4.0–10.5)
nRBC: 0 % (ref 0.0–0.2)

## 2022-01-24 LAB — HEMOGLOBIN A1C
Hgb A1c MFr Bld: 6.1 % — ABNORMAL HIGH (ref 4.8–5.6)
Mean Plasma Glucose: 128.37 mg/dL

## 2022-01-24 LAB — GLUCOSE, CAPILLARY: Glucose-Capillary: 111 mg/dL — ABNORMAL HIGH (ref 70–99)

## 2022-01-24 NOTE — Progress Notes (Signed)
DUE TO COVID-19 ONLY 2  VISITOR  are ALLOWED TO COME WITH YOU AND STAY IN THE WAITING ROOM ONLY DURING PRE OP AND PROCEDURE DAY OF SURGERY.  24 VISITOR  MAY VISIT WITH YOU AFTER SURGERY IN YOUR PRIVATE ROOM DURING VISITING HOURS ONLY! ?YOU MAY HAVE ONE PERSON SPEND THE NITE WITH YOU IN YOUR ROOM AFTER SURGERY.   ? Your procedure is scheduled on:  ?  01/31/2022  ? Report to [facility] Main  Entrance ? ? Report to admitting at       0730         AM ?DO NOT BRING INSURANCE CARD, PICTURE ID OR WALLET DAY OF SURGERY.  ?  ? ? Call this number if you have problems the morning of surgery [phone]  ? ? REMEMBER: NO  SOLID FOODS , CANDY, GUM OR MINTS AFTER MIDNITE THE NITE BEFORE SURGERY .       [facility] CLEAR LIQUIDS UNTIL   0645am              DAY OF SURGERY.       ? ? ?CLEAR LIQUID DIET ? ? ?Foods Allowed      ?WATER ?BLACK COFFEE ( SUGAR OK, NO MILK, CREAM OR CREAMER) REGULAR AND DECAF  ?TEA ( SUGAR OK NO MILK, CREAM, OR CREAMER) REGULAR AND DECAF  ?PLAIN JELLO ( NO RED)  ?FRUIT ICES ( NO RED, NO FRUIT PULP)  ?POPSICLES ( NO RED)  ?JUICE- APPLE, WHITE GRAPE AND WHITE CRANBERRY  ?SPORT DRINK LIKE GATORADE ( NO RED)  ?CLEAR BROTH ( VEGETABLE , CHICKEN OR BEEF)                                                               ? ?    ? ?BRUSH YOUR TEETH MORNING OF SURGERY AND RINSE YOUR MOUTH OUT, NO CHEWING GUM CANDY OR MINTS. ?  ? ? Take these medicines the morning of surgery with A SIP OF WATER: amlodipine  ? ? ?DO NOT TAKE ANY DIABETIC MEDICATIONS DAY OF YOUR SURGERY ?                  ?            You may not have any metal on your body including hair pins and  ?            piercings  Do not wear jewelry, make-up, lotions, powders or perfumes, deodorant ?            Do not wear nail polish on your fingernails.   ?           IF YOU ARE A FEMALE AND WANT TO SHAVE UNDER ARMS OR LEGS PRIOR TO SURGERY YOU MUST DO SO AT LEAST 48 HOURS PRIOR TO SURGERY.  ?            Men may shave face and neck. ? ? Do not bring valuables to  the hospital. [place] NOT ?            RESPONSIBLE   FOR VALUABLES. ? Contacts, dentures or bridgework may not be worn into surgery. ? Leave suitcase in the car. After surgery it may be brought to your room. ? ?  ? Patients  discharged the day of surgery will not be allowed to drive home. IF YOU ARE HAVING SURGERY AND GOING HOME THE SAME DAY, YOU MUST HAVE AN ADULT TO DRIVE YOU HOME AND BE WITH YOU FOR 24 HOURS. YOU MAY GO HOME BY TAXI OR UBER OR ORTHERWISE, BUT AN ADULT MUST ACCOMPANY YOU HOME AND STAY WITH YOU FOR 24 HOURS. ?  ? ?            Please read over the following fact sheets you were given: ?_____________________________________________________________________ ? ?[facility] - Preparing for Surgery ?Before surgery, you can play an important role.  Because skin is not sterile, your skin needs to be as free of germs as possible.  You can reduce the number of germs on your skin by washing with CHG (chlorahexidine gluconate) soap before surgery.  CHG is an antiseptic cleaner which kills germs and bonds with the skin to continue killing germs even after washing. ?Please DO NOT use if you have an allergy to CHG or antibacterial soaps.  If your skin becomes reddened/irritated stop using the CHG and inform your nurse when you arrive at Short Stay. ?Do not shave (including legs and underarms) for at least 48 hours prior to the first CHG shower.  You may shave your face/neck. ?Please follow these instructions carefully: ? 1.  Shower with CHG Soap the night before surgery and the  morning of Surgery. ? 2.  If you choose to wash your hair, wash your hair first as usual with your  normal  shampoo. ? 3.  After you shampoo, rinse your hair and body thoroughly to remove the  shampoo.                           4.  Use CHG as you would any other liquid soap.  You can apply chg directly  to the skin and wash  ?                     Gently with a scrungie or clean washcloth. ? 5.  Apply the CHG Soap to your body ONLY  FROM THE NECK DOWN.   Do not use on face/ open      ?                     Wound or open sores. Avoid contact with eyes, ears mouth and genitals (private parts).  ?                     Production manager,  Genitals (private parts) with your normal soap. ?            6.  Wash thoroughly, paying special attention to the area where your surgery  will be performed. ? 7.  Thoroughly rinse your body with warm water from the neck down. ? 8.  DO NOT shower/wash with your normal soap after using and rinsing off  the CHG Soap. ?               9.  Pat yourself dry with a clean towel. ?           10.  Wear clean pajamas. ?           11.  Place clean sheets on your bed the night of your first shower and do not  sleep with pets. ?Day of Surgery : ?Do not apply any lotions/deodorants the morning  of surgery.  Please wear clean clothes to the hospital/surgery center. ? ?FAILURE TO FOLLOW THESE INSTRUCTIONS MAY RESULT IN THE CANCELLATION OF YOUR SURGERY ?PATIENT SIGNATURE_________________________________ ? ?NURSE SIGNATURE__________________________________ ? ?________________________________________________________________________  ? ? ?           ?

## 2022-01-24 NOTE — Progress Notes (Addendum)
Anesthesia Review: ? ?PCP:  Amy Stephens,NP  LOV 11/21/21  ?Liver- Dawn drazek,NP  LOV 11/15/21  ?PulmRodman Key Hunsucker- LOV 01/05/22 ?Inf Disease- Gala Murdoch Mananahar  ?Cardiologist : ?Chest x-ray : 10/10/21  ?EKG : 10/02/21  ?Echo :09/28/21  ?Vascular- 09/28/21  ?PFT- 01/05/22  ?Stress test: ?Cardiac Cath :  ?Activity level: can do a flight of stairs without difficuolty  ?Sleep Study/ CPAP : ?PT states in process of getting sleep study done with surgery 08/2021- pt reports she was told she was snoring real hard during surgery and woke up with oxygen  ?Fasting Blood Sugar :      / Checks Blood Sugar -- times a day:   ?Blood Thinner/ Instructions /Last Dose: ?ASA / Instructions/ Last Dose :   ?DM- prediabetes- does not check glucose at home  ?Hgba1c- 01/24/22- 6.1  ?

## 2022-01-31 ENCOUNTER — Ambulatory Visit (HOSPITAL_COMMUNITY): Payer: BC Managed Care – PPO | Admitting: Anesthesiology

## 2022-01-31 ENCOUNTER — Ambulatory Visit (HOSPITAL_COMMUNITY): Payer: BC Managed Care – PPO

## 2022-01-31 ENCOUNTER — Ambulatory Visit (HOSPITAL_COMMUNITY): Payer: BC Managed Care – PPO | Admitting: Physician Assistant

## 2022-01-31 ENCOUNTER — Encounter (HOSPITAL_COMMUNITY): Payer: Self-pay | Admitting: Urology

## 2022-01-31 ENCOUNTER — Encounter (HOSPITAL_COMMUNITY)
Admission: RE | Disposition: A | Discharge status: Home / Self Care | Payer: Self-pay | Source: Home / Self Care | Attending: Urology

## 2022-01-31 ENCOUNTER — Ambulatory Visit (HOSPITAL_COMMUNITY)
Admission: RE | Admit: 2022-01-31 | Discharge: 2022-01-31 | Disposition: A | Discharge status: Home / Self Care | Payer: BC Managed Care – PPO | Attending: Urology | Admitting: Urology

## 2022-01-31 DIAGNOSIS — R7303 Prediabetes: Secondary | ICD-10-CM | POA: Diagnosis not present

## 2022-01-31 DIAGNOSIS — Z8616 Personal history of COVID-19: Secondary | ICD-10-CM | POA: Diagnosis not present

## 2022-01-31 DIAGNOSIS — Z79899 Other long term (current) drug therapy: Secondary | ICD-10-CM | POA: Diagnosis not present

## 2022-01-31 DIAGNOSIS — K219 Gastro-esophageal reflux disease without esophagitis: Secondary | ICD-10-CM | POA: Diagnosis not present

## 2022-01-31 DIAGNOSIS — Z7984 Long term (current) use of oral hypoglycemic drugs: Secondary | ICD-10-CM | POA: Insufficient documentation

## 2022-01-31 DIAGNOSIS — I10 Essential (primary) hypertension: Secondary | ICD-10-CM | POA: Insufficient documentation

## 2022-01-31 DIAGNOSIS — N200 Calculus of kidney: Secondary | ICD-10-CM | POA: Insufficient documentation

## 2022-01-31 DIAGNOSIS — N201 Calculus of ureter: Secondary | ICD-10-CM | POA: Diagnosis not present

## 2022-01-31 DIAGNOSIS — F32A Depression, unspecified: Secondary | ICD-10-CM | POA: Diagnosis not present

## 2022-01-31 DIAGNOSIS — Z87442 Personal history of urinary calculi: Secondary | ICD-10-CM | POA: Insufficient documentation

## 2022-01-31 DIAGNOSIS — F419 Anxiety disorder, unspecified: Secondary | ICD-10-CM | POA: Diagnosis not present

## 2022-01-31 DIAGNOSIS — Z87891 Personal history of nicotine dependence: Secondary | ICD-10-CM | POA: Diagnosis not present

## 2022-01-31 HISTORY — PX: CYSTOSCOPY/URETEROSCOPY/HOLMIUM LASER: SHX6545

## 2022-01-31 LAB — GLUCOSE, CAPILLARY: Glucose-Capillary: 104 mg/dL — ABNORMAL HIGH (ref 70–99)

## 2022-01-31 SURGERY — CYSTOURETEROSCOPY, USING HOLMIUM LASER
Anesthesia: General | Laterality: Left

## 2022-01-31 MED ORDER — TAMSULOSIN HCL 0.4 MG PO CAPS: 0.4000 mg | ORAL_CAPSULE | Freq: Every day | ORAL | 1 refills | Status: DC

## 2022-01-31 MED ORDER — FENTANYL CITRATE (PF) 100 MCG/2ML IJ SOLN
INTRAMUSCULAR | Status: AC
  Filled 2022-01-31: qty 2

## 2022-01-31 MED ORDER — ACETAMINOPHEN 160 MG/5ML PO SOLN: 325.0000 mg | ORAL | Status: DC | PRN

## 2022-01-31 MED ORDER — ONDANSETRON HCL 4 MG/2ML IJ SOLN
INTRAMUSCULAR | Status: AC
  Filled 2022-01-31: qty 2

## 2022-01-31 MED ORDER — FENTANYL CITRATE PF 50 MCG/ML IJ SOSY
25.0000 ug | PREFILLED_SYRINGE | INTRAMUSCULAR | Status: DC | PRN
  Administered 2022-01-31: 50 ug via INTRAVENOUS

## 2022-01-31 MED ORDER — ORAL CARE MOUTH RINSE: 15.0000 mL | Freq: Once | OROMUCOSAL | Status: AC

## 2022-01-31 MED ORDER — CEFAZOLIN SODIUM-DEXTROSE 2-4 GM/100ML-% IV SOLN
2.0000 g | INTRAVENOUS | Status: AC
  Administered 2022-01-31: 2 g via INTRAVENOUS
  Filled 2022-01-31: qty 100

## 2022-01-31 MED ORDER — KETOROLAC TROMETHAMINE 10 MG PO TABS
10.0000 mg | ORAL_TABLET | Freq: Three times a day (TID) | ORAL | 0 refills | Status: DC | PRN
Start: 2022-01-31 — End: 2022-04-16

## 2022-01-31 MED ORDER — IOHEXOL 300 MG/ML  SOLN
INTRAMUSCULAR | Status: DC | PRN
  Administered 2022-01-31: 10 mL

## 2022-01-31 MED ORDER — ONDANSETRON HCL 4 MG/2ML IJ SOLN: 4.0000 mg | Freq: Once | INTRAMUSCULAR | Status: DC | PRN

## 2022-01-31 MED ORDER — ACETAMINOPHEN-CODEINE #3 300-30 MG PO TABS: 1.0000 | ORAL_TABLET | Freq: Four times a day (QID) | ORAL | 0 refills | Status: DC | PRN

## 2022-01-31 MED ORDER — MEPERIDINE HCL 50 MG/ML IJ SOLN: 6.2500 mg | INTRAMUSCULAR | Status: DC | PRN

## 2022-01-31 MED ORDER — MIDAZOLAM HCL 2 MG/2ML IJ SOLN
INTRAMUSCULAR | Status: AC
  Filled 2022-01-31: qty 2

## 2022-01-31 MED ORDER — OXYCODONE HCL 5 MG/5ML PO SOLN: 5.0000 mg | Freq: Once | ORAL | Status: AC | PRN

## 2022-01-31 MED ORDER — OXYCODONE HCL 5 MG PO TABS
ORAL_TABLET | ORAL | Status: AC
  Filled 2022-01-31: qty 1

## 2022-01-31 MED ORDER — FENTANYL CITRATE PF 50 MCG/ML IJ SOSY
PREFILLED_SYRINGE | INTRAMUSCULAR | Status: AC
  Filled 2022-01-31: qty 1

## 2022-01-31 MED ORDER — LIDOCAINE 2% (20 MG/ML) 5 ML SYRINGE
INTRAMUSCULAR | Status: DC | PRN
  Administered 2022-01-31: 100 mg via INTRAVENOUS

## 2022-01-31 MED ORDER — 0.9 % SODIUM CHLORIDE (POUR BTL) OPTIME
TOPICAL | Status: DC | PRN
  Administered 2022-01-31: 1000 mL

## 2022-01-31 MED ORDER — MIDAZOLAM HCL 5 MG/5ML IJ SOLN
INTRAMUSCULAR | Status: DC | PRN
  Administered 2022-01-31: 2 mg via INTRAVENOUS

## 2022-01-31 MED ORDER — PROPOFOL 10 MG/ML IV BOLUS
INTRAVENOUS | Status: DC | PRN
  Administered 2022-01-31: 200 mg via INTRAVENOUS

## 2022-01-31 MED ORDER — FENTANYL CITRATE (PF) 100 MCG/2ML IJ SOLN
INTRAMUSCULAR | Status: DC | PRN
  Administered 2022-01-31 (×4): 50 ug via INTRAVENOUS

## 2022-01-31 MED ORDER — CEPHALEXIN 500 MG PO CAPS: 500.0000 mg | ORAL_CAPSULE | Freq: Two times a day (BID) | ORAL | 0 refills | Status: DC

## 2022-01-31 MED ORDER — ONDANSETRON HCL 4 MG/2ML IJ SOLN
INTRAMUSCULAR | Status: DC | PRN
  Administered 2022-01-31: 4 mg via INTRAVENOUS

## 2022-01-31 MED ORDER — PROPOFOL 10 MG/ML IV BOLUS
INTRAVENOUS | Status: AC
  Filled 2022-01-31: qty 20

## 2022-01-31 MED ORDER — OXYBUTYNIN CHLORIDE 5 MG PO TABS
5.0000 mg | ORAL_TABLET | Freq: Three times a day (TID) | ORAL | 1 refills | Status: DC | PRN
Start: 2022-01-31 — End: 2022-04-16

## 2022-01-31 MED ORDER — OXYCODONE HCL 5 MG PO TABS
5.0000 mg | ORAL_TABLET | Freq: Once | ORAL | Status: AC | PRN
  Administered 2022-01-31: 5 mg via ORAL

## 2022-01-31 MED ORDER — SODIUM CHLORIDE 0.9 % IR SOLN
Status: DC | PRN
  Administered 2022-01-31: 3000 mL

## 2022-01-31 MED ORDER — LACTATED RINGERS IV SOLN: INTRAVENOUS | Status: DC

## 2022-01-31 MED ORDER — ACETAMINOPHEN 325 MG PO TABS
325.0000 mg | ORAL_TABLET | ORAL | Status: DC | PRN
  Administered 2022-01-31: 650 mg via ORAL

## 2022-01-31 MED ORDER — KETOROLAC TROMETHAMINE 30 MG/ML IJ SOLN
30.0000 mg | Freq: Once | INTRAMUSCULAR | Status: AC
  Administered 2022-01-31: 30 mg via INTRAVENOUS

## 2022-01-31 MED ORDER — CHLORHEXIDINE GLUCONATE 0.12 % MT SOLN
15.0000 mL | Freq: Once | OROMUCOSAL | Status: AC
  Administered 2022-01-31: 15 mL via OROMUCOSAL

## 2022-01-31 SURGICAL SUPPLY — 23 items
BAG COUNTER SPONGE SURGICOUNT (BAG) IMPLANT
BAG SPNG CNTER NS LX DISP (BAG)
BAG URO CATCHER STRL LF (MISCELLANEOUS) ×2 IMPLANT
BASKET ZERO TIP NITINOL 2.4FR (BASKET) ×1 IMPLANT
BSKT STON RTRVL ZERO TP 2.4FR (BASKET) ×1
CATH URETERAL DUAL LUMEN 10F (MISCELLANEOUS) ×2 IMPLANT
CATH URETL OPEN END 6FR 70 (CATHETERS) IMPLANT
CLOTH BEACON ORANGE TIMEOUT ST (SAFETY) ×2 IMPLANT
GLOVE SURG MICRO LTX SZ7 (GLOVE) ×2 IMPLANT
GUIDEWIRE STR DUAL SENSOR (WIRE) ×2 IMPLANT
GUIDEWIRE ZIPWRE .038 STRAIGHT (WIRE) ×2 IMPLANT
IV NS 1000ML (IV SOLUTION) ×2
IV NS 1000ML BAXH (IV SOLUTION) ×1 IMPLANT
KIT TURNOVER KIT A (KITS) IMPLANT
LASER FIB FLEXIVA PULSE ID 365 (Laser) IMPLANT
MANIFOLD NEPTUNE II (INSTRUMENTS) ×2 IMPLANT
PACK CYSTO (CUSTOM PROCEDURE TRAY) ×2 IMPLANT
SHEATH NAVIGATOR HD 11/13X36 (SHEATH) ×1 IMPLANT
STENT URET 6FRX24 CONTOUR (STENTS) ×1 IMPLANT
TRACTIP FLEXIVA PULS ID 200XHI (Laser) IMPLANT
TRACTIP FLEXIVA PULSE ID 200 (Laser) ×2
TUBING CONNECTING 10 (TUBING) ×2 IMPLANT
TUBING UROLOGY SET (TUBING) ×2 IMPLANT

## 2022-01-31 NOTE — Anesthesia Preprocedure Evaluation (Addendum)
Anesthesia Evaluation  ?Patient identified by MRN, date of birth, ID band ?Patient awake ? ? ? ?Reviewed: ?Allergy & Precautions, NPO status , Patient's Chart, lab work & pertinent test results ? ?Airway ?Mallampati: II ? ?TM Distance: >3 FB ?Neck ROM: Full ? ? ? Dental ? ?(+) Teeth Intact, Chipped, Missing, Poor Dentition, Dental Advisory Given,  ?  ?Pulmonary ?Patient abstained from smoking., former smoker,  ?  ?Pulmonary exam normal ? ? ? ? ? ? ? Cardiovascular ?hypertension, Pt. on medications ? ?Rhythm:Regular Rate:Normal ? ?ECHO 12/22 ?1. Left ventricular ejection fraction, by estimation, is 60 to 65%. The  ?left ventricle has normal function. The left ventricle has no regional  ?wall motion abnormalities. Left ventricular diastolic parameters were  ?normal.  ??2. Right ventricular systolic function is normal. The right ventricular  ?size is normal.  ??3. The mitral valve is normal in structure. No evidence of mitral valve  ?regurgitation. No evidence of mitral stenosis.  ??4. The aortic valve is tricuspid. Aortic valve regurgitation is not  ?visualized. No aortic stenosis is present.  ?  ?Neuro/Psych ?PSYCHIATRIC DISORDERS Anxiety Depression negative neurological ROS ?   ? GI/Hepatic ?Neg liver ROS, GERD  ,  ?Endo/Other  ?negative endocrine ROS ? Renal/GU ?negative Renal ROS  ?Female GU complaint ?Fibroids, dysmenorrhea  ? ?  ?Musculoskeletal ?negative musculoskeletal ROS ?(+)  ? Abdominal ?Normal abdominal exam  (+)   ?Peds ? Hematology ?negative hematology ROS ?(+)   ?Anesthesia Other Findings ? ? Reproductive/Obstetrics ? ?  ? ? ? ? ? ? ? ? ? ? ? ? ? ?  ?  ? ? ? ? ? ? ? ?Anesthesia Physical ? ?Anesthesia Plan ? ?ASA: 2 ? ?Anesthesia Plan: General  ? ?Post-op Pain Management:   ? ?Induction: Intravenous ? ?PONV Risk Score and Plan: 2 and Ondansetron, Dexamethasone and Midazolam ? ?Airway Management Planned: Oral ETT and LMA ? ?Additional Equipment: None ? ?Intra-op Plan:   ? ?Post-operative Plan: Extubation in OR ? ?Informed Consent: I have reviewed the patients History and Physical, chart, labs and discussed the procedure including the risks, benefits and alternatives for the proposed anesthesia with the patient or authorized representative who has indicated his/her understanding and acceptance.  ? ? ? ?Dental advisory given ? ?Plan Discussed with: CRNA and Anesthesiologist ? ?Anesthesia Plan Comments: ( )  ? ? ? ? ? ? ?Anesthesia Quick Evaluation ? ?

## 2022-01-31 NOTE — H&P (Signed)
H&P ? ? ?History of Present Illness: Brianna Gutierrez is a 46 y.o. year old with a left renal stone. Options were discussed and she elected to proceed with ureteroscopy with laser litho, stent placement ? ?No acute complaints ? ?Past Medical History:  ?Diagnosis Date  ? Abnormal Pap smear of cervix   ? Anxiety   ? Depression   ? Dysmenorrhea   ? Eczema   ? History of COVID-19 10/24/2020  ? loss of taste and smellbody aches and cough x 4 days all symptoms resolved  ? History of kidney stones   ? Hypertension   ? Pre-diabetes   ? Rash 08/31/2021  ? comes and goes on legs saw dermetology no rash now ? food allergy  ? SUI (stress urinary incontinence, female)   ? Trichomonas infection 02/2021  ? Uterine fibroid   ? ? ?Past Surgical History:  ?Procedure Laterality Date  ? BLADDER SUSPENSION N/A 09/11/2021  ? Procedure: TRANSVAGINAL TAPE (TVT) PROCEDURE;  Surgeon: [name], MD;  Location: [facility];  Service: Gynecology;  Laterality: N/A;  ? [place]  ? x 1  ? CHOLECYSTECTOMY N/A 01/14/2019  ? Procedure: LAPAROSCOPIC CHOLECYSTECTOMY WITH POSSIBLE  INTRAOPERATIVE CHOLANGIOGRAM;  Surgeon: Armandina Gemma, MD;  Location: [facility];  Service: General;  Laterality: N/A;  ? CYSTOSCOPY N/A 09/11/2021  ? Procedure: CYSTOSCOPY;  Surgeon: [name], MD;  Location: [facility];  Service: Gynecology;  Laterality: N/A;  ? DILITATION & CURRETTAGE/HYSTROSCOPY WITH HYDROTHERMAL ABLATION N/A 06/22/2016  ? Procedure: DILATATION & CURETTAGE/HYSTEROSCOPY WITH HYDROTHERMAL ABLATION;  Surgeon: Shelly Bombard, MD;  Location: [facility];  Service: Gynecology;  Laterality: N/A;  ? TOTAL LAPAROSCOPIC HYSTERECTOMY WITH SALPINGECTOMY Bilateral 09/11/2021  ? Procedure: TOTAL LAPAROSCOPIC HYSTERECTOMY WITH SALPINGECTOMY;  Surgeon: [name], MD;  Location: [facility];  Service: Gynecology;  Laterality: Bilateral;  ? TUBAL LIGATION    ?  09-28-2015  ? ? ?Home Medications:  ?Current Meds  ?Medication Sig  ? amLODipine (NORVASC) 10 MG tablet Take 10 mg by mouth in the morning.  ? betamethasone dipropionate 0.05 % cream Apply 1 application topically daily as needed (itching/ allergic reaction).  ? hydrOXYzine (VISTARIL) 25 MG capsule Take 1 capsule (25 mg total) by mouth every 8 (eight) hours as needed. (Patient taking differently: Take 25 mg by mouth at bedtime.)  ? levocetirizine (XYZAL) 5 MG tablet Take 5 mg by mouth at bedtime.  ? metFORMIN (GLUCOPHAGE) 500 MG tablet Take 1 tablet (500 mg total) by mouth 2 (two) times daily with a meal.  ? nicotine (NICODERM CQ - DOSED IN MG/24 HOURS) 14 mg/24hr patch Place 1 patch (14 mg total) onto the skin daily.  ? Prenatal Vit-Fe Fumarate-FA (PRENATAL PO) Take 1 tablet by mouth in the morning.  ? sertraline (ZOLOFT) 100 MG tablet Take 1 tablet (100 mg total) by mouth at bedtime.  ? ? ?Allergies:  ?Allergies  ?Allergen Reactions  ? Tramadol Other (See Comments)  ?  Headache  ? ? ?Family History  ?Problem Relation Age of Onset  ? Hypertension Maternal Grandmother   ? Diabetes Maternal Grandfather   ? Colon cancer Neg Hx   ? Esophageal cancer Neg Hx   ? Rectal cancer Neg Hx   ? Stomach cancer Neg Hx   ? ? ?Social History:  reports that she quit smoking about 4 months ago. Her smoking use included cigarettes. She has a 2.00 pack-year smoking  history. She has never used smokeless tobacco. She reports that she does not currently use alcohol. She reports that she does not use drugs. ? ?ROS: ?A complete review of systems was performed.  All systems are negative except for pertinent findings as noted. ? ?Physical Exam:  ?Vital signs in last 24 hours: ?Temp:  [98 ?F (36.7 ?C)] 98 ?F (36.7 ?C) (04/05 0756) ?Pulse Rate:  [89] 89 (04/05 0756) ?Resp:  [16] 16 (04/05 0756) ?BP: (147)/(98) 147/98 (04/05 0756) ?SpO2:  [96 %] 96 % (04/05 0756) ?Constitutional:  Alert and oriented, No acute distress ?Cardiovascular: Regular rate  and rhythm, No JVD ?Respiratory: Normal respiratory effort, Lungs clear bilaterally ?GI: Abdomen is soft, nontender, nondistended, no abdominal masses ?GU: No CVA tenderness ?Lymphatic: No lymphadenopathy ?Neurologic: Grossly intact, no focal deficits ?Psychiatric: Normal mood and affect ? ? ? ?Laboratory Data:  ?No results for input(s): WBC, HGB, HCT, PLT in the last 72 hours. ? ?No results for input(s): NA, K, CL, GLUCOSE, BUN, CALCIUM, CREATININE in the last 72 hours. ? ?Invalid input(s): CO3 ? ? ?Results for orders placed or performed during the hospital encounter of 01/31/22 (from the past 24 hour(s))  ?Glucose, capillary     Status: Abnormal  ? Collection Time: 01/31/22  7:54 AM  ?Result Value Ref Range  ? Glucose-Capillary 104 (H) 70 - 99 mg/dL  ? ?No results found for this or any previous visit (from the past 240 hour(s)). ? ?Renal Function: ?Recent Labs  ?  01/24/22 ?1338  ?CREATININE 0.74  ? ?Estimated Creatinine Clearance: 90 mL/min (by C-G formula based on SCr of 0.74 mg/dL). ? ?Radiologic Imaging: ?No results found. ? ?Assessment:  ?46 yo F with a left renal stone ? ?Plan:  ?To OR as planned. Procedure and risks reviewed. ? ?Donald Pore, MD ?01/31/2022, 8:33 AM  ?[facility] ?Pager: [phone] ? ? ?

## 2022-01-31 NOTE — Discharge Instructions (Addendum)
[facility] ?[phone] ?Post Ureteroscopy With or Without Stent Instructions ? ?*remove stent by pulling string on Monday* ? ?Definitions: ? ?Ureter: The duct that transports urine from the kidney to the bladder. ?Stent:   A plastic hollow tube that is placed into the ureter, from the kidney to the bladder to prevent the ureter from swelling shut. ? ?GENERAL INSTRUCTIONS: ? ?Despite the fact that no skin incisions were used, the area around the ureter and bladder is raw and irritated. The stent is a foreign body which will further irritate the bladder wall. This irritation is manifested by increased frequency of urination, both day and night, and by an increase in the urge to urinate. In some, the urge to urinate is present almost always. Sometimes the urge is strong enough that you may not be able to stop yourself from urinating. The only real cure is to remove the stent and then give time for the bladder wall to heal which can't be done until the danger of the ureter swelling shut has passed, which varies. ? ?You may see some blood in your urine while the stent is in place and a few days afterwards. Do not be alarmed, even if the urine was clear for a while. Get off your feet and drink lots of fluids until clearing occurs. If you start to pass clots or don't improve, call us. ? ?DIET: ?You may return to your normal diet immediately. Because of the raw surface of your bladder, alcohol, spicy foods, acid type foods and drinks with caffeine may cause irritation or frequency and should be used in moderation. To keep your urine flowing freely and to avoid constipation, drink plenty of fluids during the day ( 8-10 glasses ). ?Tip: Avoid cranberry juice because it is very acidic. ? ?ACTIVITY: ?Your physical activity doesn't need to be restricted. However, if you are very active, you may see some blood in your urine. We suggest that you reduce your activity under these circumstances until the bleeding  has stopped. ? ?BOWELS: ?It is important to keep your bowels regular during the postoperative period. Straining with bowel movements can cause bleeding. A bowel movement every other day is reasonable. Use a mild laxative if needed, such as Milk of Magnesia 2-3 tablespoons, or 2 Dulcolax tablets. Call if you continue to have problems. If you have been taking narcotics for pain, before, during or after your surgery, you may be constipated. Take a laxative if necessary. ? ? ?MEDICATION: ?You should resume your pre-surgery medications unless told not to. In addition you will often be given an antibiotic to prevent infection and likely several as needed medications for stent related discomfort. These should be taken as prescribed until the bottles are finished unless you are having an unusual reaction to one of the drugs. ? ?PROBLEMS YOU SHOULD REPORT TO [place]: ?Fevers over 100.5 Fahrenheit. ?Heavy bleeding, or clots ( See above notes about blood in urine ). ?Inability to urinate. ?Drug reactions ( hives, rash, nausea, vomiting, diarrhea ). ?Severe burning or pain with urination that is not improving. ? ? ? ? ? ?

## 2022-01-31 NOTE — Anesthesia Postprocedure Evaluation (Signed)
Anesthesia Post Note ? ?Patient: Arlena Marsan ? ?Procedure(s) Performed: LEFT URETEROSCOPY/HOLMIUM LASER (Left) ? ?  ? ?Patient location during evaluation: PACU ?Anesthesia Type: General ?Level of consciousness: awake and alert ?Pain management: pain level controlled ?Vital Signs Assessment: post-procedure vital signs reviewed and stable ?Respiratory status: spontaneous breathing, nonlabored ventilation, respiratory function stable and patient connected to nasal cannula oxygen ?Cardiovascular status: blood pressure returned to baseline and stable ?Postop Assessment: no apparent nausea or vomiting ?Anesthetic complications: no ? ? ?No notable events documented. ? ?Last Vitals:  ?Vitals:  ? 01/31/22 1145 01/31/22 1200  ?BP: (!) 139/103 132/90  ?Pulse:    ?Resp: 20   ?Temp:    ?SpO2: 96% 96%  ?  ?Last Pain:  ?Vitals:  ? 01/31/22 1115  ?TempSrc:   ?PainSc: 5   ? ? ?  ?  ?  ?  ?  ?  ? ?Harriette Tovey ? ? ? ? ?

## 2022-01-31 NOTE — Op Note (Signed)
Operative Note ? ?Preoperative diagnosis:  ?1.  Left renal stone ? ?Postoperative diagnosis: ?1.  Left renal stone ? ?Procedure(s): ?1.  Left ureteroscopy with laser lithotripsy and basket extraction of stones ?2. Cystoscopy  ?3. Left retrograde pyelogram ?4. Left ureteral stent placement 6x24 ?5. Fluoroscopy with intraoperative interpretation ? ?Surgeon: Donald Pore, MD ? ?Assistants:  None ? ?Anesthesia:  General ? ?Complications:  None ? ?EBL:  Minimal ? ?Specimens: ?1. stones for stone analysis (to be done at [facility]) ? ?Drains/Catheters: ?1.  Left 6Fr x 24cm ureteral stent with tether string ? ?Intraoperative findings:   ?Cystoscopy demonstrated unremarkable bladder ?Left Ureteroscopy demonstrated stone ?Successful stent placement. ? ?Indication:  Mayah Deshazor Ronnald Ramp is a 46 y.o. female with a left renal stone. After discussing options, she elected to proceed with operative intervention ? ?Description of procedure: ?After informed consent was obtained from the patient, the patient was identified and taken to the operating room and placed in the supine position.  General anesthesia was administered as well as perioperative IV antibiotics.  At the beginning of the case, a time-out was performed to properly identify the patient, the surgery to be performed, and the surgical site.  Sequential compression devices were applied to the lower extremities at the beginning of the case for DVT prophylaxis.  The patient was then placed in the dorsal lithotomy supine position, prepped and draped in sterile fashion. ? ?Preliminary scout fluoroscopy did not reveal the stone. We then passed the 21-French rigid cystoscope through the urethra and into the bladder under vision without any difficulty , noting a normal urethra without strictures.  A systematic evaluation of the bladder revealed no evidence of any suspicious bladder lesions.  Ureteral orifices were in normal position.   ? ?We then passed a 0.038 glide wire  up to the level of the renal pelvis. The cystoscope was withdrawn, and a dual lumen catheter was inserted over the glide wire into the distal ureter. A gentle retrograde pyelogram was performed, revealing a normal caliber ureter without any filling defects. There was no hydronephrosis of the collecting system.  A 0.038 sensor wire was then passed up to the level of the renal pelvis and secured to the drape as a safety wire. The dual lumen was removed. ? ?An 11/13Fr ureteral access sheath was carefully advanced up the ureter to the level of the UPJ over this wire under fluoroscopic guidance. The flexible ureteroscope was advanced into the collecting system via the access sheath. The collecting system was inspected. The calculus was identified in the collecting system. Using the 242 micron holmium laser fiber, the stone was fragmented completely. A 2.2 Fr zero tip basket was used to remove the fragments under visual guidance. These were sent for chemical analysis. With the ureteroscope in the kidney, a gentle pyelogram was performed to delineate the calyceal system and we evaluated the calyces systematically. No further stone fragments were encountered. The calyces were re-inspected and there were no significant stone fragment residual.  ? ?We then withdrew the ureteroscope back down the ureter along with the access sheath, noting no evidence of any stones along the course of the ureter. Once the ureteroscope was removed, we then used the Glidewire under fluoroscopic guidance and passed up a 6-French x 24 cm double-pigtail ureteral stent up the ureter, making sure that the proximal and distal ends coiled within the kidney and bladder respectively. ? ?Note that we left a tether string attached to the distal end of the ureteral stent and  it exited the urethral meatus and was secured to the mons with a tegaderm adhesive.  The cystoscope was then advanced back into the bladder under vision.  We were able to see the distal  stent coiling nicely within the bladder.  The bladder was then emptied with irrigation solution.  The cystoscope was then removed.   ? ?The patient tolerated the procedure well and there was no complication. Patient was awoken from anesthesia and taken to the recovery room in stable condition. I was present and scrubbed for the entirety of the case. ? ?Plan:  Patient will be discharged home and can remove stent on Monday ? ? ?G. Donald Pore MD ?[facility]  ?Pager: [phone]  ?

## 2022-01-31 NOTE — Anesthesia Procedure Notes (Signed)
Procedure Name: LMA Insertion ?Date/Time: 01/31/2022 9:18 AM ?Performed by: Maxwell Caul, CRNA ?Pre-anesthesia Checklist: Patient identified, Emergency Drugs available, Suction available and Patient being monitored ?Patient Re-evaluated:Patient Re-evaluated prior to induction ?Oxygen Delivery Method: Circle system utilized ?Preoxygenation: Pre-oxygenation with 100% oxygen ?Induction Type: IV induction ?LMA: LMA with gastric port inserted ?LMA Size: 4.0 ?Number of attempts: 1 ?Placement Confirmation: positive ETCO2 and breath sounds checked- equal and bilateral ?Tube secured with: Tape ?Dental Injury: Teeth and Oropharynx as per pre-operative assessment  ? ? ? ? ?

## 2022-01-31 NOTE — Transfer of Care (Signed)
Immediate Anesthesia Transfer of Care Note ? ?Patient: Brianna Gutierrez ? ?Procedure(s) Performed: LEFT URETEROSCOPY/HOLMIUM LASER (Left) ? ?Patient Location: PACU ? ?Anesthesia Type:General ? ?Level of Consciousness: awake, alert  and oriented ? ?Airway & Oxygen Therapy: Patient Spontanous Breathing and Patient connected to face mask oxygen ? ?Post-op Assessment: Report given to RN and Post -op Vital signs reviewed and stable ? ?Post vital signs: Reviewed and stable ? ?Last Vitals:  ?Vitals Value Taken Time  ?BP 151/104 01/31/22 1020  ?Temp    ?Pulse 93 01/31/22 1021  ?Resp 21 01/31/22 1021  ?SpO2 98 % 01/31/22 1021  ?Vitals shown include unvalidated device data. ? ?Last Pain:  ?Vitals:  ? 01/31/22 0756  ?TempSrc: Oral  ?   ? ?  ? ?Complications: No notable events documented. ?

## 2022-02-01 ENCOUNTER — Emergency Department (HOSPITAL_COMMUNITY): Payer: BC Managed Care – PPO

## 2022-02-01 ENCOUNTER — Emergency Department (HOSPITAL_COMMUNITY)
Admission: EM | Admit: 2022-02-01 | Discharge: 2022-02-01 | Disposition: A | Discharge status: Home / Self Care | Payer: BC Managed Care – PPO | Attending: Emergency Medicine | Admitting: Emergency Medicine

## 2022-02-01 ENCOUNTER — Other Ambulatory Visit: Payer: Self-pay

## 2022-02-01 ENCOUNTER — Encounter (HOSPITAL_COMMUNITY): Payer: Self-pay | Admitting: Urology

## 2022-02-01 DIAGNOSIS — Z79899 Other long term (current) drug therapy: Secondary | ICD-10-CM | POA: Diagnosis not present

## 2022-02-01 DIAGNOSIS — G8918 Other acute postprocedural pain: Secondary | ICD-10-CM | POA: Insufficient documentation

## 2022-02-01 DIAGNOSIS — R109 Unspecified abdominal pain: Secondary | ICD-10-CM

## 2022-02-01 DIAGNOSIS — N134 Hydroureter: Secondary | ICD-10-CM | POA: Diagnosis not present

## 2022-02-01 DIAGNOSIS — R1032 Left lower quadrant pain: Secondary | ICD-10-CM | POA: Insufficient documentation

## 2022-02-01 DIAGNOSIS — K769 Liver disease, unspecified: Secondary | ICD-10-CM | POA: Diagnosis not present

## 2022-02-01 DIAGNOSIS — N261 Atrophy of kidney (terminal): Secondary | ICD-10-CM | POA: Diagnosis not present

## 2022-02-01 DIAGNOSIS — K439 Ventral hernia without obstruction or gangrene: Secondary | ICD-10-CM | POA: Diagnosis not present

## 2022-02-01 LAB — COMPREHENSIVE METABOLIC PANEL
ALT: 17 U/L (ref 0–44)
AST: 14 U/L — ABNORMAL LOW (ref 15–41)
Albumin: 3.5 g/dL (ref 3.5–5.0)
Alkaline Phosphatase: 98 U/L (ref 38–126)
Anion gap: 8 (ref 5–15)
BUN: 12 mg/dL (ref 6–20)
CO2: 25 mmol/L (ref 22–32)
Calcium: 9.1 mg/dL (ref 8.9–10.3)
Chloride: 106 mmol/L (ref 98–111)
Creatinine, Ser: 0.76 mg/dL (ref 0.44–1.00)
GFR, Estimated: >60 mL/min (ref >60)
Glucose, Bld: 109 mg/dL — ABNORMAL HIGH (ref 70–99)
Potassium: 3.7 mmol/L (ref 3.5–5.1)
Sodium: 139 mmol/L (ref 135–145)
Total Bilirubin: 0.2 mg/dL — ABNORMAL LOW (ref 0.3–1.2)
Total Protein: 7.6 g/dL (ref 6.5–8.1)

## 2022-02-01 LAB — CBC WITH DIFFERENTIAL/PLATELET
Abs Immature Granulocytes: 0.03 10*3/uL (ref 0.00–0.07)
Basophils Absolute: 0 10*3/uL (ref 0.0–0.1)
Basophils Relative: 0 %
Eosinophils Absolute: 0.1 10*3/uL (ref 0.0–0.5)
Eosinophils Relative: 2 %
HCT: 43.4 % (ref 36.0–46.0)
Hemoglobin: 13.4 g/dL (ref 12.0–15.0)
Immature Granulocytes: 0 %
Lymphocytes Relative: 15 %
Lymphs Abs: 1.2 10*3/uL (ref 0.7–4.0)
MCH: 22.1 pg — ABNORMAL LOW (ref 26.0–34.0)
MCHC: 30.9 g/dL (ref 30.0–36.0)
MCV: 71.7 fL — ABNORMAL LOW (ref 80.0–100.0)
Monocytes Absolute: 0.6 10*3/uL (ref 0.1–1.0)
Monocytes Relative: 8 %
Neutro Abs: 5.8 10*3/uL (ref 1.7–7.7)
Neutrophils Relative %: 75 %
Platelets: 407 10*3/uL — ABNORMAL HIGH (ref 150–400)
RBC: 6.05 MIL/uL — ABNORMAL HIGH (ref 3.87–5.11)
RDW: 16.5 % — ABNORMAL HIGH (ref 11.5–15.5)
WBC: 7.8 10*3/uL (ref 4.0–10.5)
nRBC: 0 % (ref 0.0–0.2)

## 2022-02-01 MED ORDER — SODIUM CHLORIDE 0.9 % IV BOLUS
1000.0000 mL | Freq: Once | INTRAVENOUS | Status: AC
  Administered 2022-02-01: 1000 mL via INTRAVENOUS

## 2022-02-01 MED ORDER — HYDROMORPHONE HCL 1 MG/ML IJ SOLN
1.0000 mg | Freq: Once | INTRAMUSCULAR | Status: AC
  Administered 2022-02-01: 1 mg via INTRAVENOUS
  Filled 2022-02-01: qty 1

## 2022-02-01 MED ORDER — ONDANSETRON HCL 4 MG/2ML IJ SOLN
4.0000 mg | Freq: Once | INTRAMUSCULAR | Status: AC
  Administered 2022-02-01: 4 mg via INTRAVENOUS
  Filled 2022-02-01: qty 2

## 2022-02-01 MED ORDER — KETOROLAC TROMETHAMINE 15 MG/ML IJ SOLN
15.0000 mg | Freq: Once | INTRAMUSCULAR | Status: AC
  Administered 2022-02-01: 15 mg via INTRAVENOUS
  Filled 2022-02-01: qty 1

## 2022-02-01 NOTE — ED Triage Notes (Signed)
Patient had a kidney stone removed on the left side. When she went to the bathroom the string came out. She called the doc and he told her to take the whole string out. She is in so much pain. ?

## 2022-02-01 NOTE — ED Provider Notes (Signed)
?[facility] DEPT ?Provider Note ? ? ?CSN: [id number] ?Arrival date & time: 02/01/22  1904 ? ?  ? ?History ? ?Chief Complaint  ?Patient presents with  ? Post-op Problem  ? ? ?Brianna Gutierrez is a 46 y.o. female. ? ?46 year old female with a past medical history of recent left stone stent placement by [facility] presents to the ED with worsening left flank pain x's morning.  Patient had a stent placed to her left kidney by Dr. Cain Sieve for passage of kidney stone, reports today she began to have worsening left flank pain, call [facility] who prescribed her some hydrocodone.  She reports taking this medication without any improvement in her symptoms.  Reports 1 episode when she went to the bathroom and began to void, noted a string coming out of her, states that she was able to pull this out, also noted some hematuria.  Describes the pain as very severe in nature radiating from her left flank onto her left lower quadrant.  Called the urology clinic again who stated patient needed to be evaluated in the emergency department.  She has been taken oxycodone, Toradol without any improvement in her pain.  She denies any fever, nausea, vomiting, abdominal pain. ? ? ?The history is provided by the patient.  ? ?  ? ?Home Medications ?Prior to Admission medications   ?Medication Sig Start Date End Date Taking? Authorizing Provider  ?amLODipine (NORVASC) 10 MG tablet Take 10 mg by mouth every evening. 03/07/21  Yes [provider]  ?cephALEXin (KEFLEX) 500 MG capsule Take 1 capsule (500 mg total) by mouth 2 (two) times daily. 01/31/22  Yes Vira Agar, MD  ?hydrOXYzine (VISTARIL) 25 MG capsule Take 1 capsule (25 mg total) by mouth every 8 (eight) hours as needed. ?Patient taking differently: Take 25 mg by mouth daily as needed for anxiety or itching. 11/21/21  Yes Minette Brine, Amy J, NP  ?ketorolac (TORADOL) 10 MG tablet Take 1 tablet (10 mg total) by mouth every 8 (eight) hours  as needed for moderate pain or severe pain. 01/31/22  Yes Vira Agar, MD  ?levocetirizine (XYZAL) 5 MG tablet Take 5 mg by mouth at bedtime. 06/09/21  Yes [provider]  ?metFORMIN (GLUCOPHAGE) 500 MG tablet Take 1 tablet (500 mg total) by mouth 2 (two) times daily with a meal. 09/29/21 01/20/24 Yes Hall, Lorenda Cahill, DO  ?nicotine (NICODERM CQ - DOSED IN MG/24 HOURS) 14 mg/24hr patch Place 1 patch (14 mg total) onto the skin daily. 12/19/21  Yes Minette Brine, Amy J, NP  ?oxybutynin (DITROPAN) 5 MG tablet Take 1 tablet (5 mg total) by mouth every 8 (eight) hours as needed for bladder spasms. 01/31/22  Yes Vira Agar, MD  ?oxyCODONE (OXY IR/ROXICODONE) 5 MG immediate release tablet Take 5 mg by mouth 3 (three) times daily as needed for moderate pain or severe pain. 02/01/22  Yes [provider]  ?sertraline (ZOLOFT) 100 MG tablet Take 1 tablet (100 mg total) by mouth at bedtime. 11/21/21 02/19/22 Yes Minette Brine, Amy J, NP  ?tamsulosin (FLOMAX) 0.4 MG CAPS capsule Take 1 capsule (0.4 mg total) by mouth daily after supper. 01/31/22  Yes Vira Agar, MD  ?acetaminophen (TYLENOL) 500 MG tablet Take 2 tablets (1,000 mg total) by mouth every 6 (six) hours as needed for mild pain. ?Patient not taking: Reported on 01/19/2022 10/04/21   [name], MD  ?acetaminophen-codeine (TYLENOL #3) 300-30 MG tablet Take 1 tablet by mouth  every 6 (six) hours as needed for moderate pain. ?Patient not taking: Reported on 02/01/2022 01/31/22   Vira Agar, MD  ?dicyclomine (BENTYL) 20 MG tablet Take 1 tablet (20 mg total) by mouth 2 (two) times daily. 10/29/19 08/24/20  Ok Edwards, PA-C  ?omeprazole (PRILOSEC) 20 MG capsule Take 1 capsule (20 mg total) by mouth daily. 01/01/19 08/07/19  Scot Jun, FNP  ?   ? ?Allergies    ?Tramadol   ? ?Review of Systems   ?Review of Systems  ?Constitutional:  Negative for chills and fever.  ?HENT:  Negative for sore throat.   ?Respiratory:  Negative for shortness of breath.    ?Cardiovascular:  Negative for chest pain.  ?Gastrointestinal:  Negative for abdominal pain, nausea and vomiting.  ?Genitourinary:  Positive for flank pain and hematuria.  ?Neurological:  Negative for light-headedness and headaches.  ?All other systems reviewed and are negative. ? ?Physical Exam ?Updated Vital Signs ?BP (!) 151/85   Pulse 87   Temp 98.4 ?F (36.9 ?C) (Oral)   Resp 18   Ht '5\' 1"'$  (1.549 m)   Wt 89.8 kg   LMP 08/18/2021 (Exact Date)   SpO2 100%   BMI 37.41 kg/m?  ?Physical Exam ?Vitals and nursing note reviewed.  ?Constitutional:   ?   Appearance: Normal appearance. She is ill-appearing.  ?   Comments: Teary-eyed on examination.  ?HENT:  ?   Head: Normocephalic and atraumatic.  ?Eyes:  ?   Pupils: Pupils are equal, round, and reactive to light.  ?Cardiovascular:  ?   Rate and Rhythm: Normal rate.  ?Pulmonary:  ?   Effort: Pulmonary effort is normal.  ?   Breath sounds: No wheezing.  ?   Comments: Lungs are clear to auscultation, no rales, rhonchi or wheezing. ?Abdominal:  ?   General: Abdomen is flat.  ?   Palpations: Abdomen is soft.  ?   Tenderness: There is left CVA tenderness.  ?   Comments: Severe left CVA tenderness with left lower quadrant tenderness.  ?Musculoskeletal:  ?   Cervical back: Normal range of motion and neck supple.  ?Skin: ?   General: Skin is warm and dry.  ?Neurological:  ?   Mental Status: She is alert and oriented to person, place, and time.  ? ? ?ED Results / Procedures / Treatments   ?Labs ?(all labs ordered are listed, but only abnormal results are displayed) ?Labs Reviewed  ?CBC WITH DIFFERENTIAL/PLATELET - Abnormal; Notable for the following components:  ?    Result Value  ? RBC 6.05 (*)   ? MCV 71.7 (*)   ? MCH 22.1 (*)   ? RDW 16.5 (*)   ? Platelets 407 (*)   ? All other components within normal limits  ?COMPREHENSIVE METABOLIC PANEL - Abnormal; Notable for the following components:  ? Glucose, Bld 109 (*)   ? AST 14 (*)   ? Total Bilirubin 0.2 (*)   ? All other  components within normal limits  ?URINALYSIS, ROUTINE W REFLEX MICROSCOPIC  ? ? ?EKG ?None ? ?Radiology ?DG C-Arm 1-60 Min-No Report ? ?Result Date: 01/31/2022 ?Fluoroscopy was utilized by the requesting physician.  No radiographic interpretation.  ? ?CT Renal Stone Study ? ?Result Date: 02/01/2022 ?CLINICAL DATA:  Flank pain, kidney stone suspected. Left stone removal 1 day prior with expelled "string" with micturition. EXAM: CT ABDOMEN AND PELVIS WITHOUT CONTRAST TECHNIQUE: Multidetector CT imaging of the abdomen and pelvis was performed following the standard protocol without  IV contrast. RADIATION DOSE REDUCTION: This exam was performed according to the departmental dose-optimization program which includes automated exposure control, adjustment of the mA and/or kV according to patient size and/or use of iterative reconstruction technique. COMPARISON:  10/25/2021 chest radiograph. FINDINGS: Lower chest: Mild platelike bibasilar scarring versus atelectasis. Hepatobiliary: Liver size. Hypodense 1.6 cm anterior left liver lesion (series 2/image 12), stable in size since 12/08/2021 MRI abdomen study, where it was indeterminate, favored benign. No additional liver lesions. Cholecystectomy. No biliary ductal dilatation. Pancreas: Normal, with no mass or duct dilation. Spleen: Normal size. No mass. Adrenals/Urinary Tract: Normal adrenals. Chronic asymmetric moderate right renal atrophy. No stones in the renal collecting systems. No hydronephrosis. No contour deforming renal masses. Normal caliber right ureter. Mild left ureterectasis with fat stranding along the length of the left ureter. No ureteral stones. Small amount of gas in the nondependent bladder. Otherwise normal nondistended bladder with no bladder stones. Stomach/Bowel: Normal non-distended stomach. Normal caliber small bowel with no small bowel wall thickening. Normal appendix. Normal large bowel with no diverticulosis, large bowel wall thickening or  pericolonic fat stranding. Vascular/Lymphatic: Normal caliber abdominal aorta. No pathologically enlarged lymph nodes in the abdomen or pelvis. Reproductive: Status post hysterectomy, with no abnormal findings at the

## 2022-02-01 NOTE — Discharge Instructions (Signed)
Your laboratory results are within normal limits today. ? ?You may continue to follow-up with [facility]. ? ?You may resume your pain medication at home, however please do not take any more Toradol tonight, as you received some while in the emergency department. ?

## 2022-02-07 NOTE — Progress Notes (Signed)
? ? ?Patient ID: Brianna Gutierrez, female    DOB: Nov 29, 1975  MRN: [id number] ? ?CC: Anxiety Depression Follow-Up ? ?Subjective: ?Brianna Gutierrez is a 46 y.o. female who presents for anxiety depression follow-up. ? ?Her concerns today include:  ?ANXIETY DEPRESSION FOLLOW-UP: ?11/21/2021: ?- Continue Sertraline and Hydroxyzine as prescribed.  ? ?02/13/2022: ?Doing well on current regimen, no issues/concerns.  ? ?2. ECZEMA: ?Reports was seeing Dermatology office based in [place], [place]. Was told eczema likely related to food allergy. Unsure of what foods causative agent. Reports develops hives. Benadryl helps some. Currently using no scent soaps which has improved some.  ? ?3. BREAST PAIN: ?Bilateral for 7 days. No additional red flag symptoms. ? ?4. SLEEP STUDY: ?Reports her health insurance plans only covers home sleep study. ? ? ?  02/13/2022  ?  1:16 PM 11/17/2021  ?  2:30 PM 10/24/2021  ? 10:36 AM 03/07/2021  ?  3:59 PM 02/07/2021  ?  8:40 AM  ?Depression screen PHQ 2/9  ?Decreased Interest 0 0 0 0 0  ?Down, Depressed, Hopeless 0 0 0 0 0  ?PHQ - 2 Score 0 0 0 0 0  ?Altered sleeping    0 0  ?Tired, decreased energy    0 0  ?Change in appetite    0 0  ?Feeling bad or failure about yourself     0 0  ?Trouble concentrating    0 0  ?Moving slowly or fidgety/restless    0 0  ?Suicidal thoughts    0 0  ?PHQ-9 Score    0 0  ?Difficult doing work/chores    Not difficult at all Not difficult at all  ? ? ? ?Patient Active Problem List  ? Diagnosis Date Noted  ? Pulmonary embolism ([place]) 11/30/2021  ? Smoking 10/26/2021  ? Postoperative intra-abdominal abscess   ? Medication monitoring encounter   ? Pelvic infection in female 09/24/2021  ? Status post laparoscopic hysterectomy 09/11/2021  ? Cholelithiasis 01/13/2019  ? Pre-diabetes 11/15/2016  ? History of bilateral tubal ligation 09/28/2015  ? Tobacco abuse 09/28/2015  ?  ? ?Current Outpatient Medications on File Prior to Visit  ?Medication Sig Dispense Refill   ? acetaminophen (TYLENOL) 500 MG tablet Take 2 tablets (1,000 mg total) by mouth every 6 (six) hours as needed for mild pain. 30 tablet 0  ? acetaminophen-codeine (TYLENOL #3) 300-30 MG tablet Take 1 tablet by mouth every 6 (six) hours as needed for moderate pain. 16 tablet 0  ? amLODipine (NORVASC) 10 MG tablet Take 10 mg by mouth every evening.    ? cephALEXin (KEFLEX) 500 MG capsule Take 1 capsule (500 mg total) by mouth 2 (two) times daily. 10 capsule 0  ? ketorolac (TORADOL) 10 MG tablet Take 1 tablet (10 mg total) by mouth every 8 (eight) hours as needed for moderate pain or severe pain. 15 tablet 0  ? levocetirizine (XYZAL) 5 MG tablet Take 5 mg by mouth at bedtime.    ? metFORMIN (GLUCOPHAGE) 500 MG tablet Take 1 tablet (500 mg total) by mouth 2 (two) times daily with a meal. 180 tablet 0  ? nicotine (NICODERM CQ - DOSED IN MG/24 HOURS) 14 mg/24hr patch Place 1 patch (14 mg total) onto the skin daily. 28 patch 2  ? oxybutynin (DITROPAN) 5 MG tablet Take 1 tablet (5 mg total) by mouth every 8 (eight) hours as needed for bladder spasms. 20 tablet 1  ? oxyCODONE (OXY IR/ROXICODONE) 5 MG immediate release tablet  Take 5 mg by mouth 3 (three) times daily as needed for moderate pain or severe pain.    ? [DISCONTINUED] dicyclomine (BENTYL) 20 MG tablet Take 1 tablet (20 mg total) by mouth 2 (two) times daily. 20 tablet 0  ? [DISCONTINUED] omeprazole (PRILOSEC) 20 MG capsule Take 1 capsule (20 mg total) by mouth daily. 30 capsule 1  ? ?No current facility-administered medications on file prior to visit.  ? ? ?Allergies  ?Allergen Reactions  ? Tramadol Other (See Comments)  ?  Headache  ? ? ?Social History  ? ?Socioeconomic History  ? Marital status: Married  ?  Spouse name: Not on file  ? Number of children: Not on file  ? Years of education: Not on file  ? Highest education level: Not on file  ?Occupational History  ? Not on file  ?Tobacco Use  ? Smoking status: Former  ?  Packs/day: 0.10  ?  Years: 20.00  ?  Pack  years: 2.00  ?  Types: Cigarettes  ?  Quit date: 10/01/2021  ?  Years since quitting: 0.3  ? Smokeless tobacco: Never  ?Vaping Use  ? Vaping Use: Former  ?Substance and Sexual Activity  ? Alcohol use: Not Currently  ? Drug use: Never  ? Sexual activity: Not Currently  ?  Partners: Male  ?  Birth control/protection: Surgical  ?  Comment: [facility] 09/11/21  ?Other Topics Concern  ? Not on file  ?Social History Narrative  ? Not on file  ? ?Social Determinants of Health  ? ?Financial Resource Strain: Not on file  ?Food Insecurity: Not on file  ?Transportation Needs: Not on file  ?Physical Activity: Not on file  ?Stress: Not on file  ?Social Connections: Not on file  ?Intimate Partner Violence: Not on file  ? ? ?Family History  ?Problem Relation Age of Onset  ? Hypertension Maternal Grandmother   ? Diabetes Maternal Grandfather   ? Colon cancer Neg Hx   ? Esophageal cancer Neg Hx   ? Rectal cancer Neg Hx   ? Stomach cancer Neg Hx   ? ? ?Past Surgical History:  ?Procedure Laterality Date  ? BLADDER SUSPENSION N/A 09/11/2021  ? Procedure: TRANSVAGINAL TAPE (TVT) PROCEDURE;  Surgeon: [name], MD;  Location: [facility];  Service: Gynecology;  Laterality: N/A;  ? [place]  ? x 1  ? CHOLECYSTECTOMY N/A 01/14/2019  ? Procedure: LAPAROSCOPIC CHOLECYSTECTOMY WITH POSSIBLE  INTRAOPERATIVE CHOLANGIOGRAM;  Surgeon: Armandina Gemma, MD;  Location: [facility];  Service: General;  Laterality: N/A;  ? CYSTOSCOPY N/A 09/11/2021  ? Procedure: CYSTOSCOPY;  Surgeon: [name], MD;  Location[facility];  Service: Gynecology;  Laterality: N/A;  ? CYSTOSCOPY/URETEROSCOPY/HOLMIUM LASER Left 01/31/2022  ? Procedure: LEFT URETEROSCOPY/HOLMIUM LASER;  Surgeon: Vira Agar, MD;  Location: [facility];  Service: Urology;  Laterality: Left;  ? DILITATION & CURRETTAGE/HYSTROSCOPY WITH HYDROTHERMAL ABLATION N/A 06/22/2016  ? Procedure: DILATATION & CURETTAGE/HYSTEROSCOPY WITH  HYDROTHERMAL ABLATION;  Surgeon: Shelly Bombard, MD;  Location: [facility];  Service: Gynecology;  Laterality: N/A;  ? TOTAL LAPAROSCOPIC HYSTERECTOMY WITH SALPINGECTOMY Bilateral 09/11/2021  ? Procedure: TOTAL LAPAROSCOPIC HYSTERECTOMY WITH SALPINGECTOMY;  Surgeon: [name], MD;  Location: [facility];  Service: Gynecology;  Laterality: Bilateral;  ? TUBAL LIGATION    ? 09-28-2015  ? ? ?ROS: ?Review of Systems ?Negative except as stated above ? ?PHYSICAL EXAM: ?BP 131/83 (BP Location: Left  Arm, Patient Position: Sitting, Cuff Size: Large)   Pulse 97   Temp 98.3 ?F (36.8 ?C)   Resp 18   Ht 5' 0.98" (1.549 m)   Wt 194 lb (88 kg)   LMP 08/18/2021 (Exact Date)   SpO2 96%   BMI 36.68 kg/m?  ? ?Physical Exam ?HENT:  ?   Head: Normocephalic and atraumatic.  ?Eyes:  ?   Extraocular Movements: Extraocular movements intact.  ?   Conjunctiva/sclera: Conjunctivae normal.  ?   Pupils: Pupils are equal, round, and reactive to light.  ?Cardiovascular:  ?   Rate and Rhythm: Normal rate and regular rhythm.  ?   Pulses: Normal pulses.  ?   Heart sounds: Normal heart sounds.  ?Pulmonary:  ?   Effort: Pulmonary effort is normal.  ?   Breath sounds: Normal breath sounds.  ?Musculoskeletal:  ?   Cervical back: Normal range of motion and neck supple.  ?Neurological:  ?   General: No focal deficit present.  ?   Mental Status: She is alert and oriented to person, place, and time.  ?Psychiatric:     ?   Mood and Affect: Mood normal.     ?   Behavior: Behavior normal.  ? ?ASSESSMENT AND PLAN: ?1. Anxiety and depression: ?- Patient denies thoughts of self-harm, suicidal ideations, homicidal ideations. ?- Continue Sertraline and Hydroxyzine as prescribed.  ?- Follow-up with primary provider in 4 months or sooner if needed.  ?- sertraline (ZOLOFT) 100 MG tablet; Take 1 tablet (100 mg total) by mouth at bedtime.  Dispense: 30 tablet; Refill: 3 ?- hydrOXYzine (VISTARIL) 25 MG capsule; Take 1 capsule (25 mg  total) by mouth every 8 (eight) hours as needed.  Dispense: 30 capsule; Refill: 2 ? ?2. Eczema, unspecified type: ?3. Food allergy: ?- Patient reports was seeing Dermatology office based in [place], [place]. Was

## 2022-02-12 ENCOUNTER — Ambulatory Visit (HOSPITAL_COMMUNITY)
Admission: RE | Admit: 2022-02-12 | Discharge: 2022-02-12 | Disposition: A | Discharge status: Home / Self Care | Payer: BC Managed Care – PPO | Source: Ambulatory Visit | Attending: Nurse Practitioner | Admitting: Nurse Practitioner

## 2022-02-12 ENCOUNTER — Ambulatory Visit (INDEPENDENT_AMBULATORY_CARE_PROVIDER_SITE_OTHER): Payer: BC Managed Care – PPO | Admitting: Nurse Practitioner

## 2022-02-12 ENCOUNTER — Encounter: Payer: Self-pay | Admitting: Nurse Practitioner

## 2022-02-12 ENCOUNTER — Encounter (HOSPITAL_COMMUNITY): Payer: Self-pay

## 2022-02-12 ENCOUNTER — Inpatient Hospital Stay: Admission: RE | Admit: 2022-02-12 | Payer: BC Managed Care – PPO | Source: Ambulatory Visit

## 2022-02-12 VITALS — BP 124/86 | HR 96 | Ht 61.0 in | Wt 197.0 lb

## 2022-02-12 DIAGNOSIS — I2699 Other pulmonary embolism without acute cor pulmonale: Secondary | ICD-10-CM

## 2022-02-12 DIAGNOSIS — R0602 Shortness of breath: Secondary | ICD-10-CM | POA: Diagnosis not present

## 2022-02-12 DIAGNOSIS — R079 Chest pain, unspecified: Secondary | ICD-10-CM | POA: Diagnosis not present

## 2022-02-12 LAB — BASIC METABOLIC PANEL
BUN: 11 mg/dL (ref 6–23)
CO2: 26 mEq/L (ref 19–32)
Calcium: 8.9 mg/dL (ref 8.4–10.5)
Chloride: 104 mEq/L (ref 96–112)
Creatinine, Ser: 0.77 mg/dL (ref 0.40–1.20)
GFR: 92.98 mL/min (ref >60.00)
Glucose, Bld: 90 mg/dL (ref 70–99)
Potassium: 4.1 mEq/L (ref 3.5–5.1)
Sodium: 137 mEq/L (ref 135–145)

## 2022-02-12 MED ORDER — SODIUM CHLORIDE (PF) 0.9 % IJ SOLN
INTRAMUSCULAR | Status: AC
  Filled 2022-02-12: qty 50

## 2022-02-12 MED ORDER — IOHEXOL 350 MG/ML SOLN
100.0000 mL | Freq: Once | INTRAVENOUS | Status: AC | PRN
  Administered 2022-02-12: 62 mL via INTRAVENOUS

## 2022-02-12 NOTE — Patient Instructions (Addendum)
Labs today - BMET  ? ?CTA chest to evaluate for pulmonary embolism.  ? ?Follow up in two weeks with Dr. Silas Flood or Bryon Lions. If symptoms do not improve or worsen, please contact office for sooner follow up or seek emergency care. ?

## 2022-02-12 NOTE — Progress Notes (Signed)
? ?'@Patient'$  ID: Brianna Gutierrez, female    DOB: [birth year], 46 y.o.   MRN: [id number] ? ?Chief Complaint  ?Patient presents with  ? Follow-up  ?  Chest tightness and sob since 01/31/22 after having kidney stones removed  ? ? ?Referring provider: ?[name], NP ? ?HPI: ?46 year old female, former smoker followed for pulmonary embolism. She is a patient of Dr. Kavin Leech and last seen in office on 01/05/2022. Past medical history significant for HTN, seasonal allergies, sleep apnea, prediabetes.   ? ?TEST/EVENTS:  ?01/05/2022 PFTs: FVC 77, FEV1 83, ratio 89, TLC 76, DLCOcor 94. Mild restrictive airway disease with normal diffusion capacity  ? ?01/05/2022: OV with Dr. Silas Flood. PFTs relatively unremarkable - mild restriction, suspected to be related to body habitus. DLCO normal - argues against residual PE. Symptoms gradually improving; some residual DOE but suspected to be r/t deconditioning. Completed 3 months of anticoagulation - stopped Eliquis. Advised to follow up if symptoms return or worsen ? ?02/12/2022: Today - acute visit ?Patient presents today for new onset chest tightness and increased shortness of breath. She completed 3 months of Eliquis therapy in mid April for provoked PE after hysterectomy. She was doing well until recently when she went to the hospital and underwent lithotripsy, cystoscopy and stent placement for kidney stone. A few days after being discharged, she began having chest tightness, occasional dry cough, and worsening DOE. She denies any hemoptysis, calf pain or swelling. She denies any URI symptoms or recent sick exposures. Oxygen has been stable on room air.  ? ?Allergies  ?Allergen Reactions  ? Tramadol Other (See Comments)  ?  Headache  ? ? ?Immunization History  ?Administered Date(s) Administered  ? Influenza,inj,Quad PF,6+ Mos 09/26/2016, 11/22/2020  ? PFIZER(Purple Top)SARS-COV-2 Vaccination 01/10/2020, 02/07/2020, 09/29/2020  ? Tdap 11/22/2020  ? ? ?Past Medical History:   ?Diagnosis Date  ? Abnormal Pap smear of cervix   ? Anxiety   ? Depression   ? Dysmenorrhea   ? Eczema   ? History of COVID-19 10/24/2020  ? loss of taste and smellbody aches and cough x 4 days all symptoms resolved  ? History of kidney stones   ? Hypertension   ? Pre-diabetes   ? Rash 08/31/2021  ? comes and goes on legs saw dermetology no rash now ? food allergy  ? SUI (stress urinary incontinence, female)   ? Trichomonas infection 02/2021  ? Uterine fibroid   ? ? ?Tobacco History: ?Social History  ? ?Tobacco Use  ?Smoking Status Former  ? Packs/day: 0.10  ? Years: 20.00  ? Pack years: 2.00  ? Types: Cigarettes  ? Quit date: 10/01/2021  ? Years since quitting: 0.3  ?Smokeless Tobacco Never  ? ?Counseling given: Not Answered ? ? ?Outpatient Medications Prior to Visit  ?Medication Sig Dispense Refill  ? acetaminophen (TYLENOL) 500 MG tablet Take 2 tablets (1,000 mg total) by mouth every 6 (six) hours as needed for mild pain. 30 tablet 0  ? amLODipine (NORVASC) 10 MG tablet Take 10 mg by mouth every evening.    ? hydrOXYzine (VISTARIL) 25 MG capsule Take 1 capsule (25 mg total) by mouth every 8 (eight) hours as needed. (Patient taking differently: Take 25 mg by mouth daily as needed for anxiety or itching.) 30 capsule 2  ? levocetirizine (XYZAL) 5 MG tablet Take 5 mg by mouth at bedtime.    ? metFORMIN (GLUCOPHAGE) 500 MG tablet Take 1 tablet (500 mg total) by mouth 2 (two) times daily  with a meal. 180 tablet 0  ? nicotine (NICODERM CQ - DOSED IN MG/24 HOURS) 14 mg/24hr patch Place 1 patch (14 mg total) onto the skin daily. 28 patch 2  ? oxyCODONE (OXY IR/ROXICODONE) 5 MG immediate release tablet Take 5 mg by mouth 3 (three) times daily as needed for moderate pain or severe pain.    ? sertraline (ZOLOFT) 100 MG tablet Take 1 tablet (100 mg total) by mouth at bedtime. 90 tablet 0  ? acetaminophen-codeine (TYLENOL #3) 300-30 MG tablet Take 1 tablet by mouth every 6 (six) hours as needed for moderate pain. 16 tablet 0   ? cephALEXin (KEFLEX) 500 MG capsule Take 1 capsule (500 mg total) by mouth 2 (two) times daily. 10 capsule 0  ? ketorolac (TORADOL) 10 MG tablet Take 1 tablet (10 mg total) by mouth every 8 (eight) hours as needed for moderate pain or severe pain. 15 tablet 0  ? oxybutynin (DITROPAN) 5 MG tablet Take 1 tablet (5 mg total) by mouth every 8 (eight) hours as needed for bladder spasms. 20 tablet 1  ? tamsulosin (FLOMAX) 0.4 MG CAPS capsule Take 1 capsule (0.4 mg total) by mouth daily after supper. 14 capsule 1  ? ?No facility-administered medications prior to visit.  ? ? ? ?Review of Systems:  ? ?Constitutional: No weight loss or gain, night sweats, fevers, chills, fatigue, or lassitude. ?HEENT: No headaches, difficulty swallowing, tooth/dental problems, or sore throat. No sneezing, itching, ear ache, nasal congestion, or post nasal drip ?CV:  No chest pain, orthopnea, PND, swelling in lower extremities, anasarca, dizziness, palpitations, syncope ?Resp: +shortness of breath with exertion; chest tightness; dry cough. No excess mucus or change in color of mucus. No hemoptysis. No wheezing.  No chest wall deformity ?GI:  No heartburn, indigestion, abdominal pain, nausea, vomiting, diarrhea, change in bowel habits, loss of appetite, bloody stools.  ?GU: No dysuria, change in color of urine, urgency or frequency.  No flank pain, no hematuria  ?Skin: No rash, lesions, ulcerations ?MSK:  No joint pain or swelling.  No decreased range of motion.  No back pain. ?Neuro: No dizziness or lightheadedness.  ?Psych: No depression or anxiety. Mood stable.  ? ? ? ?Physical Exam: ? ?BP 124/86 (BP Location: Left Arm, Cuff Size: Normal)   Pulse 96   Ht '5\' 1"'$  (1.549 m)   Wt 197 lb (89.4 kg)   LMP 08/18/2021 (Exact Date)   SpO2 96%   BMI 37.22 kg/m?  ? ?GEN: Pleasant, interactive, well-appearing; obese; in no acute distress. ?HEENT:  Normocephalic and atraumatic. PERRLA. Sclera white. Nasal turbinates pink, moist and patent  bilaterally. No rhinorrhea present. Oropharynx pink and moist, without exudate or edema. No lesions, ulcerations, or postnasal drip.  ?NECK:  Supple w/ fair ROM. No JVD present. Normal carotid impulses w/o bruits. Thyroid symmetrical with no goiter or nodules palpated. No lymphadenopathy.   ?CV: RRR, no m/r/g, no peripheral edema. Pulses intact, +2 bilaterally. No cyanosis, pallor or clubbing. ?PULMONARY:  Unlabored, regular breathing. Clear bilaterally A&P w/o wheezes/rales/rhonchi. No accessory muscle use. No dullness to percussion. ?GI: BS present and normoactive. Soft, non-tender to palpation. No organomegaly or masses detected. No CVA tenderness. ?MSK: No erythema, warmth or tenderness. Cap refil <2 sec all extrem. No deformities or joint swelling noted.  ?Neuro: A/Ox3. No focal deficits noted.   ?Skin: Warm, no lesions or rashe ?Psych: Normal affect and behavior. Judgement and thought content appropriate.  ? ? ? ?Lab Results: ? ?CBC ?   ?  Component Value Date/Time  ? WBC 7.8 02/01/2022 1940  ? RBC 6.05 (H) 02/01/2022 1940  ? HGB 13.4 02/01/2022 1940  ? HGB 13.6 12/23/2020 1528  ? HCT 43.4 02/01/2022 1940  ? HCT 43.5 12/23/2020 1528  ? PLT 407 (H) 02/01/2022 1940  ? PLT 375 12/23/2020 1528  ? MCV 71.7 (L) 02/01/2022 1940  ? MCV 73 (L) 12/23/2020 1528  ? MCH 22.1 (L) 02/01/2022 1940  ? MCHC 30.9 02/01/2022 1940  ? RDW 16.5 (H) 02/01/2022 1940  ? RDW 14.8 12/23/2020 1528  ? LYMPHSABS 1.2 02/01/2022 1940  ? LYMPHSABS 1.4 12/30/2018 1648  ? MONOABS 0.6 02/01/2022 1940  ? EOSABS 0.1 02/01/2022 1940  ? EOSABS 0.2 12/30/2018 1648  ? BASOSABS 0.0 02/01/2022 1940  ? BASOSABS 0.0 12/30/2018 1648  ? ? ?BMET ?   ?Component Value Date/Time  ? NA 137 02/12/2022 0951  ? NA 136 11/22/2020 1700  ? K 4.1 02/12/2022 0951  ? CL 104 02/12/2022 0951  ? CO2 26 02/12/2022 0951  ? GLUCOSE 90 02/12/2022 0951  ? BUN 11 02/12/2022 0951  ? BUN 11 11/22/2020 1700  ? CREATININE 0.77 02/12/2022 0951  ? CREATININE 0.62 11/22/2015 1537  ?  CALCIUM 8.9 02/12/2022 0951  ? GFRNONAA >60 02/01/2022 1940  ? [facility] >89 11/22/2015 1537  ? GFRAA 107 11/22/2020 1700  ? GFRAA >89 11/22/2015 1537  ? ? ?BNP ?   ?Component Value Date/Time  ? BNP 248.2 (H) 09/27/2021 185

## 2022-02-12 NOTE — Assessment & Plan Note (Deleted)
Recent lithotripsy and cystoscopy with stent placement. Surgery went well without complications. Follow up with urology as scheduled.  ?

## 2022-02-12 NOTE — Progress Notes (Signed)
Please notify patient there was no evidence of PE on CT scan, which is good news. Exam overall unremarkable aside from some scarring in her lung bases. Advise her to use mucinex DM for cough/chest congestion. Send rx for Albuterol inhaler 2 puffs every 6 hours as needed for shortness of breath or wheezing. Notify if symptoms persist despite rescue inhaler/neb use. Have her try the albuterol and let us know if it helps at her next visit in 2 weeks. Thanks!

## 2022-02-12 NOTE — Assessment & Plan Note (Addendum)
Given recent PE, completion of Eliquis, and then recent surgery, concerned symptoms associated with recurrent PE. Will obtain CTA chest with PE protocol for further evaluation. Advised pt on strict ED precautions. If evidence of PE on scan, we will need to obtain echo and hypercoagulability panel.  ? ?Patient Instructions  ?Labs today - BMET  ? ?CTA chest to evaluate for pulmonary embolism.  ? ?Follow up in two weeks with Dr. Silas Flood or Bryon Lions. If symptoms do not improve or worsen, please contact office for sooner follow up or seek emergency care. ? ? ?

## 2022-02-13 ENCOUNTER — Ambulatory Visit (INDEPENDENT_AMBULATORY_CARE_PROVIDER_SITE_OTHER): Payer: BC Managed Care – PPO | Admitting: Family

## 2022-02-13 ENCOUNTER — Encounter: Payer: Self-pay | Admitting: Family

## 2022-02-13 VITALS — BP 131/83 | HR 97 | Temp 98.3°F | Resp 18 | Ht 60.98 in | Wt 194.0 lb

## 2022-02-13 DIAGNOSIS — N644 Mastodynia: Secondary | ICD-10-CM

## 2022-02-13 DIAGNOSIS — F419 Anxiety disorder, unspecified: Secondary | ICD-10-CM

## 2022-02-13 DIAGNOSIS — L309 Dermatitis, unspecified: Secondary | ICD-10-CM | POA: Diagnosis not present

## 2022-02-13 DIAGNOSIS — Z91018 Allergy to other foods: Secondary | ICD-10-CM | POA: Diagnosis not present

## 2022-02-13 DIAGNOSIS — F32A Depression, unspecified: Secondary | ICD-10-CM

## 2022-02-13 DIAGNOSIS — R0683 Snoring: Secondary | ICD-10-CM

## 2022-02-13 MED ORDER — HYDROXYZINE PAMOATE 25 MG PO CAPS: 25.0000 mg | ORAL_CAPSULE | Freq: Three times a day (TID) | ORAL | 2 refills | Status: DC | PRN

## 2022-02-13 MED ORDER — SERTRALINE HCL 100 MG PO TABS: 100.0000 mg | ORAL_TABLET | Freq: Every day | ORAL | 3 refills | Status: DC

## 2022-02-13 NOTE — Progress Notes (Signed)
Pt presents for anxiety/depression f/u, states that she needs new referral to dermatologist because prev one was based in [place], wants to be referred to provider in [place]  ?

## 2022-02-14 ENCOUNTER — Other Ambulatory Visit: Payer: Self-pay

## 2022-02-14 MED ORDER — ALBUTEROL SULFATE HFA 108 (90 BASE) MCG/ACT IN AERS: 2.0000 | INHALATION_SPRAY | Freq: Four times a day (QID) | RESPIRATORY_TRACT | 2 refills | Status: DC | PRN

## 2022-02-14 NOTE — Telephone Encounter (Signed)
Albuterol inhaler 2 puffs every 6 hours as needed for shortness of breath or wheezing ?

## 2022-03-06 ENCOUNTER — Other Ambulatory Visit: Payer: Self-pay | Admitting: Family

## 2022-03-06 ENCOUNTER — Encounter: Payer: Self-pay | Admitting: Family

## 2022-03-06 DIAGNOSIS — N644 Mastodynia: Secondary | ICD-10-CM

## 2022-03-09 NOTE — Progress Notes (Addendum)
Virtual Visit via Telephone Note  I connected with Brianna Gutierrez, on 03/15/2022 at 10:54 AM by telephone and verified that I am speaking with the correct person using two identifiers.  Consent: I discussed the limitations, risks, security and privacy concerns of performing an evaluation and management service by telephone and the availability of in person appointments. I also discussed with the patient that there may be a patient responsible charge related to this service. The patient expressed understanding and agreed to proceed.   Location of Patient: Home  Location of Provider: [facility] [place] participating in Telemedicine visit: [place], NP Elmon Else, [place]   History of Present Illness: Brianna Gutierrez is a 46 year-old female who presents for anxiety depression follow-up.   Taking Sertraline daily and Hydroxyzine twice daily. Prefers referral to therapy. Denies thoughts of self-harm, suicidal ideations, homicidal ideations.  Reports did not receive EpiPen since last appointment. Recently had another outbreak of hives which caused neck to swell. Has appointment scheduled with Allergy in June 2023.    Past Medical History:  Diagnosis Date   Abnormal Pap smear of cervix    Anxiety    Depression    Dysmenorrhea    Eczema    History of COVID-19 10/24/2020   loss of taste and smellbody aches and cough x 4 days all symptoms resolved   History of kidney stones    Hypertension    Pre-diabetes    Rash 08/31/2021   comes and goes on legs saw dermetology no rash now ? food allergy   SUI (stress urinary incontinence, female)    Trichomonas infection 02/2021   Uterine fibroid    Allergies  Allergen Reactions   Tramadol Other (See Comments)    Headache    Current Outpatient Medications on File Prior to Visit  Medication Sig Dispense Refill   acetaminophen (TYLENOL) 500 MG tablet Take 2 tablets  (1,000 mg total) by mouth every 6 (six) hours as needed for mild pain. 30 tablet 0   acetaminophen-codeine (TYLENOL #3) 300-30 MG tablet Take 1 tablet by mouth every 6 (six) hours as needed for moderate pain. 16 tablet 0   albuterol (VENTOLIN HFA) 108 (90 Base) MCG/ACT inhaler Inhale 2 puffs into the lungs every 6 (six) hours as needed for wheezing or shortness of breath. 8 g 2   amLODipine (NORVASC) 10 MG tablet Take 10 mg by mouth every evening.     cephALEXin (KEFLEX) 500 MG capsule Take 1 capsule (500 mg total) by mouth 2 (two) times daily. 10 capsule 0   hydrOXYzine (VISTARIL) 25 MG capsule Take 1 capsule (25 mg total) by mouth every 8 (eight) hours as needed. 30 capsule 2   ketorolac (TORADOL) 10 MG tablet Take 1 tablet (10 mg total) by mouth every 8 (eight) hours as needed for moderate pain or severe pain. 15 tablet 0   levocetirizine (XYZAL) 5 MG tablet Take 5 mg by mouth at bedtime.     metFORMIN (GLUCOPHAGE) 500 MG tablet Take 1 tablet (500 mg total) by mouth 2 (two) times daily with a meal. 180 tablet 0   nicotine (NICODERM CQ - DOSED IN MG/24 HOURS) 14 mg/24hr patch Place 1 patch (14 mg total) onto the skin daily. 28 patch 2   oxybutynin (DITROPAN) 5 MG tablet Take 1 tablet (5 mg total) by mouth every 8 (eight) hours as needed for bladder spasms. 20 tablet 1   oxyCODONE (OXY  IR/ROXICODONE) 5 MG immediate release tablet Take 5 mg by mouth 3 (three) times daily as needed for moderate pain or severe pain.     sertraline (ZOLOFT) 100 MG tablet Take 1 tablet (100 mg total) by mouth at bedtime. 30 tablet 3   [DISCONTINUED] dicyclomine (BENTYL) 20 MG tablet Take 1 tablet (20 mg total) by mouth 2 (two) times daily. 20 tablet 0   [DISCONTINUED] omeprazole (PRILOSEC) 20 MG capsule Take 1 capsule (20 mg total) by mouth daily. 30 capsule 1   No current facility-administered medications on file prior to visit.    Observations/Objective: Alert and oriented x 3. Not in acute distress. Physical  examination not completed as this is a telemedicine visit.  Assessment and Plan: 1. Anxiety and depression: - Patient denies thoughts of self-harm, suicidal ideations, homicidal ideations. - Increase Sertraline from 100 mg once daily to 100 mg twice daily. No refills needed as of present. - Continue Hydroxyzine as prescribed. No refills needed as of present.  - Referral to Psychiatry for further evaluation and management. - Follow-up with primary provider as scheduled. - Ambulatory referral to Psychiatry  2. Eczema, unspecified type: 3. Food allergy: - Epinephrine injection as prescribed.  - Keep all scheduled appointments with Allergy. - EPINEPHrine (EPIPEN 2-PAK) 0.3 mg/0.3 mL IJ SOAJ injection; Inject 0.3 mg into the muscle as needed for anaphylaxis.  Dispense: 2 each; Refill: 0  Follow Up Instructions: Referral to Psychiatry.   Patient was given clear instructions to go to Emergency Department or return to medical center if symptoms don't improve, worsen, or new problems develop.The patient verbalized understanding.  I discussed the assessment and treatment plan with the patient. The patient was provided an opportunity to ask questions and all were answered. The patient agreed with the plan and demonstrated an understanding of the instructions.   The patient was advised to call back or seek an in-person evaluation if the symptoms worsen or if the condition fails to improve as anticipated.    I provided 8 minutes total of non-face-to-face time during this encounter.   [name], NP  [facility] [place], [place] 03/15/2022, 10:54 AM

## 2022-03-15 ENCOUNTER — Encounter: Payer: Self-pay | Admitting: Family

## 2022-03-15 ENCOUNTER — Ambulatory Visit (INDEPENDENT_AMBULATORY_CARE_PROVIDER_SITE_OTHER): Payer: BC Managed Care – PPO | Admitting: Family

## 2022-03-15 DIAGNOSIS — F32A Depression, unspecified: Secondary | ICD-10-CM | POA: Diagnosis not present

## 2022-03-15 DIAGNOSIS — F419 Anxiety disorder, unspecified: Secondary | ICD-10-CM

## 2022-03-15 DIAGNOSIS — Z91018 Allergy to other foods: Secondary | ICD-10-CM | POA: Diagnosis not present

## 2022-03-15 DIAGNOSIS — L309 Dermatitis, unspecified: Secondary | ICD-10-CM | POA: Diagnosis not present

## 2022-03-15 MED ORDER — EPINEPHRINE 0.3 MG/0.3ML IJ SOAJ: 0.3000 mg | INTRAMUSCULAR | 0 refills | Status: DC | PRN

## 2022-03-30 ENCOUNTER — Other Ambulatory Visit: Payer: Self-pay | Admitting: Family

## 2022-03-30 ENCOUNTER — Ambulatory Visit: Payer: BC Managed Care – PPO

## 2022-03-30 ENCOUNTER — Ambulatory Visit
Admission: RE | Admit: 2022-03-30 | Discharge: 2022-03-30 | Disposition: A | Discharge status: Home / Self Care | Payer: BC Managed Care – PPO | Source: Ambulatory Visit | Attending: Family | Admitting: Family

## 2022-03-30 DIAGNOSIS — N644 Mastodynia: Secondary | ICD-10-CM | POA: Diagnosis not present

## 2022-04-02 ENCOUNTER — Telehealth: Payer: Self-pay | Admitting: Family

## 2022-04-02 NOTE — Telephone Encounter (Signed)
Denyse Dago calling from [employer] is case management is Calling to request a PA for the patient for an inhome sleep study.  Vaughan Basta is also requesting the OV notes form 02/13/22.  Vaughan Basta would also like the medication list and labs faxed to her.  [place]- [phone] Fax- [phone]

## 2022-04-16 ENCOUNTER — Ambulatory Visit (INDEPENDENT_AMBULATORY_CARE_PROVIDER_SITE_OTHER): Payer: BC Managed Care – PPO | Admitting: Gastroenterology

## 2022-04-16 ENCOUNTER — Encounter: Payer: Self-pay | Admitting: Obstetrics and Gynecology

## 2022-04-16 ENCOUNTER — Ambulatory Visit (INDEPENDENT_AMBULATORY_CARE_PROVIDER_SITE_OTHER): Payer: BC Managed Care – PPO | Admitting: Obstetrics and Gynecology

## 2022-04-16 ENCOUNTER — Encounter: Payer: Self-pay | Admitting: Gastroenterology

## 2022-04-16 VITALS — BP 122/82 | HR 91 | Ht 61.0 in | Wt 194.4 lb

## 2022-04-16 VITALS — BP 122/84 | HR 96 | Ht 61.75 in | Wt 194.0 lb

## 2022-04-16 DIAGNOSIS — N951 Menopausal and female climacteric states: Secondary | ICD-10-CM

## 2022-04-16 DIAGNOSIS — R61 Generalized hyperhidrosis: Secondary | ICD-10-CM | POA: Diagnosis not present

## 2022-04-16 DIAGNOSIS — K7689 Other specified diseases of liver: Secondary | ICD-10-CM | POA: Diagnosis not present

## 2022-04-16 DIAGNOSIS — K648 Other hemorrhoids: Secondary | ICD-10-CM

## 2022-04-16 DIAGNOSIS — K921 Melena: Secondary | ICD-10-CM

## 2022-04-16 DIAGNOSIS — K5900 Constipation, unspecified: Secondary | ICD-10-CM

## 2022-04-16 DIAGNOSIS — Z01419 Encounter for gynecological examination (general) (routine) without abnormal findings: Secondary | ICD-10-CM | POA: Diagnosis not present

## 2022-04-16 DIAGNOSIS — I10 Essential (primary) hypertension: Secondary | ICD-10-CM | POA: Diagnosis not present

## 2022-04-16 DIAGNOSIS — R7303 Prediabetes: Secondary | ICD-10-CM | POA: Diagnosis not present

## 2022-04-16 NOTE — Patient Instructions (Signed)

## 2022-04-16 NOTE — Progress Notes (Signed)
Chief Complaint:    Hematochezia, symptomatic hemorrhoids, constipation  GI History: 46 year old female with a history of anxiety, depression, eczema, HTN, uterine fibroids s/p hysterectomy 08/2021, cesarean 1997, tubal ligation 2002, CCY 2020, initially seen in the GI clinic on 08/11/2021 for evaluation of rectal bleeding.  Described episodic BRBPR x2-3 months, particularly with straining to have BM.  Constipation improved with OTC stool softener. - 08/18/2021: Colonoscopy: 2 small 2-5 mm sigmoid hyperplastic polyps, Sigmoid diverticulosis.  Small internal hemorrhoids.  Normal TI.  Repeat in 10 years.  HPI:     Patient is a 46 y.o. female presenting to the Gastroenterology Clinic for follow-up.  Was initially seen by Carl Best in 07/2021 for evaluation of hematochezia.  Subsequent colonoscopy with small internal hemorrhoids.  His symptoms undergone hysterectomy in 08/2021 c/b provoked PE.  Has since completed Eliquis x3 months.  More recently had left renal stone requiring left ureteroscopy with laser lithotripsy and extraction of stones with left ureteral stent placement in 01/2022.  Today, states she is again having symptomatic hemorrhoids, characterized by intermittent bleeding.  Worse with constipation and straining with BM. +burning sensation. Will use OTC Colace prn. No improvement with OTC Preparation H cream.   Will have 2 BM/day, but with straining and pain.    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     Latest Ref Rng & Units 02/01/2022    7:40 PM 01/24/2022    1:38 PM 10/03/2021    4:31 AM  CBC  WBC 4.0 - 10.5 K/uL 7.8  6.3  10.3   Hemoglobin 12.0 - 15.0 g/dL 13.4  13.3  10.9   Hematocrit 36.0 - 46.0 % 43.4  43.4  35.1   Platelets 150 - 400 K/uL 407  443  395       Latest Ref Rng & Units 02/12/2022    9:51 AM 02/01/2022    7:40  PM 01/24/2022    1:38 PM  CMP  Glucose 70 - 99 mg/dL 90  109  102   BUN 6 - 23 mg/dL '11  12  11   '$ Creatinine 0.40 - 1.20 mg/dL 0.77  0.76  0.74   Sodium 135 - 145 mEq/L 137  139  137   Potassium 3.5 - 5.1 mEq/L 4.1  3.7  4.1   Chloride 96 - 112 mEq/L 104  106  108   CO2 19 - 32 mEq/L '26  25  21   '$ Calcium 8.4 - 10.5 mg/dL 8.9  9.1  8.9   Total Protein 6.5 - 8.1 g/dL  7.6    Total Bilirubin 0.3 - 1.2 mg/dL  0.2    Alkaline Phos 38 - 126 U/L  98    AST 15 - 41 U/L  14    ALT 0 - 44 U/L  17       Review of systems:     No chest pain, no SOB, no fevers, no urinary sx   Past Medical History:  Diagnosis Date   Abnormal Pap smear of cervix    Anxiety    Depression    Dysmenorrhea    Eczema    History of COVID-19 10/24/2020   loss of taste and smellbody aches and cough x 4 days all symptoms resolved   History of kidney stones    Hypertension    Pre-diabetes    Rash 08/31/2021  comes and goes on legs saw dermetology no rash now ? food allergy   SUI (stress urinary incontinence, female)    Trichomonas infection 02/2021   Uterine fibroid     Patient's surgical history, family medical history, social history, medications and allergies were all reviewed in Epic    Current Outpatient Medications  Medication Sig Dispense Refill   albuterol (VENTOLIN HFA) 108 (90 Base) MCG/ACT inhaler Inhale 2 puffs into the lungs every 6 (six) hours as needed for wheezing or shortness of breath. 8 g 2   amLODipine (NORVASC) 10 MG tablet Take 10 mg by mouth every evening.     EPINEPHrine (EPIPEN 2-PAK) 0.3 mg/0.3 mL IJ SOAJ injection Inject 0.3 mg into the muscle as needed for anaphylaxis. 2 each 0   hydrOXYzine (VISTARIL) 25 MG capsule Take 1 capsule (25 mg total) by mouth every 8 (eight) hours as needed. 30 capsule 2   levocetirizine (XYZAL) 5 MG tablet Take 5 mg by mouth at bedtime.     metFORMIN (GLUCOPHAGE) 500 MG tablet Take 1 tablet (500 mg total) by mouth 2 (two) times daily with a meal. 180  tablet 0   nicotine (NICODERM CQ - DOSED IN MG/24 HOURS) 14 mg/24hr patch Place 1 patch (14 mg total) onto the skin daily. 28 patch 2   sertraline (ZOLOFT) 100 MG tablet Take 1 tablet (100 mg total) by mouth at bedtime. 30 tablet 3   No current facility-administered medications for this visit.    Physical Exam:     BP 122/82   Pulse 91   Ht '5\' 1"'$  (1.549 m)   Wt 194 lb 6 oz (88.2 kg)   LMP 08/18/2021 (Exact Date)   BMI 36.73 kg/m   GENERAL:  Pleasant female in NAD PSYCH: : Cooperative, normal affect Musculoskeletal:  Normal muscle tone, normal strength NEURO: Alert and oriented x 3, no focal neurologic deficits Rectal exam: Sensation intact and preserved anal wink. No external anal fissures, external hemorrhoids or skin tags. Normal sphincter tone. No palpable mass. No blood on the exam glove.  Normal pelvic descent and appropriate sphincter relaxation and "push phase" with normal sphincter tightening with "squeeze phase" on rectal exam.  Anoscopy with grade 1-2 internal hemorrhoids in all 3 positions.  (Chaperone: Curlene Labrum, CMA).     IMPRESSION and PLAN:    1) Symptomatic Internal hemorrhoids 2) Hematochezia 3) Constipation 4) Straining to have BM  - Start with MiraLAX 1 cap/day and titrate to soft stools without straining to BM - Increase daily water intake with a goal of at least 64 ounces/day - Start fiber supplement - Increase dietary fiber.  Provided with handout and detailed instructions today - If constipation/straining not improved, can refer to [facility] +/- sitz marker study - If constipation improves but hemorrhoidal symptoms persist, discussed hemorrhoid band ligation   5) Hepatic cyst MRI abdomen in 2/does not 23 with incidentally noted benign complex hepatic cyst (vs less likely cystadenoma) - Schedule liver with and without contrast to be done in September to evaluate stability of hepatic cyst     I spent 35 minutes of time, including in depth chart review,  independent review of results as outlined above, communicating results with the patient directly, face-to-face time with the patient, coordinating care, and ordering studies and medications as appropriate, and documentation.       [place] ,DO, FACG 04/16/2022, 10:03 AM

## 2022-04-16 NOTE — Patient Instructions (Signed)
Please call [phone] to schedule your MRI on September.  You have been scheduled for an MRI at ------- on -----------------. Your appointment time is --------------. Please arrive to admitting (at main entrance of the hospital) 15 minutes prior to your appointment time for registration purposes. Please make certain not to have anything to eat or drink 6 hours prior to your test. In addition, if you have any metal in your body, have a pacemaker or defibrillator, please be sure to let your ordering physician know. This test typically takes 45 minutes to 1 hour to complete. Should you need to reschedule, please call [phone] to do so.   Take Miralax 1 Capful once daily. Drink water at least 64 oz or more  daily. Increase fiber diet Start taking fiber supplement.  Follow up appointment as needed.   Thank you for choosing me and [facility].  Gerrit Heck, D.O.   If you are age 63 or older, your body mass index should be between 23-30. Your Body mass index is 36.73 kg/m. If this is out of the aforementioned range listed, please consider follow up with your Primary Care Provider.  If you are age 50 or younger, your body mass index should be between 19-25. Your Body mass index is 36.73 kg/m. If this is out of the aformentioned range listed, please consider follow up with your Primary Care Provider.   ________________________________________________________  The [facility] providers would like to encourage you to use [facility] to communicate with providers for non-urgent requests or questions.  Due to long hold times on the telephone, sending your provider a message by [facility] may be a faster and more efficient way to get a response.  Please allow 48 business hours for a response.  Please remember that this is for non-urgent requests.  _______________________________________________________

## 2022-04-16 NOTE — Progress Notes (Signed)
46 y.o. G67P0 Married [place] American female here for annual exam.    Had a kidney stone in April.  It was removed.   Her PE resolved she is now off of blood thinners.   She is having night sweats.  She is not wanting treatment for this, but wants [place] to measure her hormones today.   Takes a MVI, fiber pill and stool softener.   Saw her GI today and had a proctoscopy to check her hemorrhoids.   Lost 6 pounds.   PCP:  Durene Fruits, NP   Patient's last menstrual period was 08/18/2021 (exact date).           Sexually active: No.  The current method of family planning is tubal ligation/hysterectomy.    Exercising: No.   Some walking Smoker:  no  Health Maintenance: Pap: 1-16-18Neg/BiRads1 History of abnormal Pap:  yes, repeat normal ~1992 MMG:  03-30-22 Diag.Bil/Neg/Birads2. Screening 1 yr Colonoscopy:  08-18-21 polyps removed/5 years BMD:   n/a  Result  n/a TDaP:  11-22-20 Gardasil:   no HIV: 07-06-21 NR Hep C:07-06-21 Neg Screening Labs:  PCP   reports that she quit smoking about 6 months ago. Her smoking use included cigarettes. She has a 2.00 pack-year smoking history. She has never used smokeless tobacco. She reports that she does not currently use alcohol. She reports that she does not use drugs.  Past Medical History:  Diagnosis Date   Abnormal Pap smear of cervix    Anxiety    Depression    Dysmenorrhea    Eczema    History of COVID-19 10/24/2020   loss of taste and smellbody aches and cough x 4 days all symptoms resolved   History of kidney stones    Hypertension    Pre-diabetes    Rash 08/31/2021   comes and goes on legs saw dermetology no rash now ? food allergy   SUI (stress urinary incontinence, female)    Trichomonas infection 02/2021   Uterine fibroid     Past Surgical History:  Procedure Laterality Date   BLADDER SUSPENSION N/A 09/11/2021   Procedure: TRANSVAGINAL TAPE (TVT) PROCEDURE;  Surgeon: [name], MD;  Location: [facility];  Service: Gynecology;  Laterality: N/A;   CESAREAN SECTION  1997   x 1   CHOLECYSTECTOMY N/A 01/14/2019   Procedure: LAPAROSCOPIC CHOLECYSTECTOMY WITH POSSIBLE  INTRAOPERATIVE CHOLANGIOGRAM;  Surgeon: Armandina Gemma, MD;  Location: [facility];  Service: General;  Laterality: N/A;   CYSTOSCOPY N/A 09/11/2021   Procedure: Consuela Mimes;  Surgeon: [name], MD;  Location: [facility];  Service: Gynecology;  Laterality: N/A;   CYSTOSCOPY/URETEROSCOPY/HOLMIUM LASER Left 01/31/2022   Procedure: LEFT URETEROSCOPY/HOLMIUM LASER;  Surgeon: Vira Agar, MD;  Location: [facility];  Service: Urology;  Laterality: Left;   DILITATION & CURRETTAGE/HYSTROSCOPY WITH HYDROTHERMAL ABLATION N/A 06/22/2016   Procedure: DILATATION & CURETTAGE/HYSTEROSCOPY WITH HYDROTHERMAL ABLATION;  Surgeon: Shelly Bombard, MD;  Location: [facility];  Service: Gynecology;  Laterality: N/A;   TOTAL LAPAROSCOPIC HYSTERECTOMY WITH SALPINGECTOMY Bilateral 09/11/2021   Procedure: TOTAL LAPAROSCOPIC HYSTERECTOMY WITH SALPINGECTOMY;  Surgeon: [name], MD;  Location: [facility];  Service: Gynecology;  Laterality: Bilateral;   TUBAL LIGATION     09-28-2015    Current Outpatient Medications  Medication Sig Dispense Refill   albuterol (VENTOLIN HFA) 108 (90 Base) MCG/ACT inhaler Inhale 2 puffs into the lungs every 6 (six) hours as needed for wheezing  or shortness of breath. 8 g 2   amLODipine (NORVASC) 10 MG tablet Take 10 mg by mouth every evening.     EPINEPHrine (EPIPEN 2-PAK) 0.3 mg/0.3 mL IJ SOAJ injection Inject 0.3 mg into the muscle as needed for anaphylaxis. 2 each 0   hydrOXYzine (VISTARIL) 25 MG capsule Take 1 capsule (25 mg total) by mouth every 8 (eight) hours as needed. 30 capsule 2   levocetirizine (XYZAL) 5 MG tablet Take 5 mg by mouth at bedtime.     metFORMIN (GLUCOPHAGE) 500 MG tablet Take 1 tablet (500 mg total) by mouth 2 (two) times daily with a  meal. 180 tablet 0   nicotine (NICODERM CQ - DOSED IN MG/24 HOURS) 14 mg/24hr patch Place 1 patch (14 mg total) onto the skin daily. 28 patch 2   sertraline (ZOLOFT) 100 MG tablet Take 1 tablet (100 mg total) by mouth at bedtime. 30 tablet 3   No current facility-administered medications for this visit.    Family History  Problem Relation Age of Onset   Hypertension Maternal Grandmother    Diabetes Maternal Grandfather    Colon cancer Neg Hx    Esophageal cancer Neg Hx    Rectal cancer Neg Hx    Stomach cancer Neg Hx     Review of Systems  All other systems reviewed and are negative.   Exam:   BP 122/84   Pulse 96   Ht 5' 1.75" (1.568 m)   Wt 194 lb (88 kg)   LMP 08/18/2021 (Exact Date)   SpO2 97%   BMI 35.77 kg/m     General appearance: alert, cooperative and appears stated age Head: normocephalic, without obvious abnormality, atraumatic Neck: no adenopathy, supple, symmetrical, trachea midline and thyroid normal to inspection and palpation Lungs: clear to auscultation bilaterally Breasts: normal appearance, no masses or tenderness, No nipple retraction or dimpling, No nipple discharge or bleeding, No axillary adenopathy Heart: regular rate and rhythm Abdomen: soft, non-tender; no masses, no organomegaly Extremities: extremities normal, atraumatic, no cyanosis or edema Skin: skin color, texture, turgor normal. No rashes or lesions Lymph nodes: cervical, supraclavicular, and axillary nodes normal. Neurologic: grossly normal  Pelvic: External genitalia:  no lesions              No abnormal inguinal nodes palpated.              Urethra:  normal appearing urethra with no masses, tenderness or lesions              Bartholins and Skenes: normal                 Vagina: normal appearing vagina with normal color and discharge, no lesions              Cervix: absent              Pap taken: no Bimanual Exam:  Uterus:  absent              Adnexa: no mass, fullness, tenderness               Rectal exam: declined.  Chaperone was present for exam:  [name], CMA  Assessment:   Well woman visit with gynecologic exam. Status post laparoscopic hysterectomy with bilateral salpingectomy, TVT, cystoscopy. Post op pelvic abscess.  Post op PE.  Menopausal symptoms.   Plan: Mammogram screening discussed. Self breast awareness reviewed. Pap and HR HPV not indicated.  Guidelines for Calcium, Vitamin D, regular exercise program  including cardiovascular and weight bearing exercise. Will check FSH and estradiol.  Follow up annually and prn.   After visit summary provided.

## 2022-04-17 LAB — ESTRADIOL: Estradiol: 97 pg/mL

## 2022-04-17 LAB — FOLLICLE STIMULATING HORMONE: FSH: 21.5 m[IU]/mL

## 2022-04-17 NOTE — Progress Notes (Signed)
New Patient Note  RE: Brianna Gutierrez MRN: [id number] DOB: 1976-04-04 Date of Office Visit: 04/18/2022  Consult requested by: [name], NP Primary care provider: [name], NP  Chief Complaint: Eczema ("Be itching all over." Random. Some times it last all day other times it is shorter.), Allergy Testing (Environmental, common foods), and Other (Break outs when she sweats, eats, and randomly at other times/Has albuterol inhaler for when she has SOB but not diagnoses with Asthma )  History of Present Illness: I had the pleasure of seeing Brianna Gutierrez for initial evaluation at the [facility] [place] on 04/19/2022. She is a 46 y.o. female, who is referred here by [name], NP for the evaluation of eczema.  Rash/itching started about 1 year ago. This can occur anywhere on her body. Describes them as itchy, red, raised. Individual rashes lasts about less than 1 day. No ecchymosis upon resolution. Associated symptoms include: none.  Frequency of episodes: few times per month. Last flare was last night.  Suspected triggers are unknown but worse with sweating. Concerned about strawberries, bananas, seafood.  Denies any fevers, chills, changes in medications, foods, personal care products or recent infections. She has tried the following therapies: some type of topical creams with some benefit. Systemic steroids - yes once given by UC. Currently on no daily antihistamines.  Previous work up includes: had a dermatology virtual visit in the past.  Previous history of rash/hives: no. Patient is up to date with the following cancer screening tests: physical exam, pap smears, mammogram, colonoscopy. No recent tick bites.   Patient had multiple medical issues recently including a hysterectomy that led to an infection requiring a PICC line and pulmonary embolism.  02/13/2022 PCP visit: "Reports was seeing Dermatology office based in [place], [place].  Was told eczema likely related to food allergy. Unsure of what foods causative agent. Reports develops hives. Benadryl helps some. Currently using no scent soaps which has improved some."  Assessment and Plan: Anglia is a 46 y.o. female with: Urticaria Pruritic rash for 1 year lasting 1 day at a time.  No triggers noted.  Occurs a few times per month.  Concerned whether strawberries, bananas and seafood may be contributing to the symptoms.  Saw dermatology virtually in the past with no benefit. Today's skin prick testing showed: Positive to mold and dog.  Negative to foods.  Based on clinical history concerning for urticaria.  See below for proper skin care. Use fragrance free and dye free products. No dryer sheets or fabric softener.  Start zyrtec (cetirizine) '10mg'$  twice a day. If symptoms are not controlled or causes drowsiness let us know. Start Pepcid (famotidine) '20mg'$  twice a day.  Avoid the following potential triggers: alcohol, tight clothing, NSAIDs, hot showers and getting overheated. Get bloodwork to rule out other etiologies.  If negative to banana and strawberry then will reintroduce at home.  Shortness of breath Dyspnea on exertion and uses albuterol as needed with good benefit.  Has been using it daily since prescribed 1 month ago.  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  CT chest in April 2023 was unremarkable. Today's spirometry showed restriction with 2% improvement FEV1 postbronchodilator treatment.  Clinically feeling swelling improved. Monitor symptoms. Only use albuterol if you have symptoms more than 5 minutes after you stop your activities. If symptoms resolve within a few minutes then shortness of breath most likely due to physical deconditioning. May use  albuterol rescue inhaler 2 puffs every 4 to 6 hours as needed for shortness of breath, chest tightness, coughing, and wheezing.  Monitor frequency of use.   Other allergic  rhinitis Increased rhinitis symptoms recently.  No prior allergy testing. Today's skin prick testing showed: Positive to mold and dog.  Will get blood work instead of intradermal testing as need to get bloodwork for other reasons.  Start environmental control measures as below. The zyrtec recommend as above should also help with these symptoms.   Return in about 6 weeks (around 05/30/2022).  Follow-up with PCP regarding elevated blood pressure in the office. Unable to draw all the blood work today and patient will return later this month to draw rest of the blood work.  Meds ordered this encounter  Medications   famotidine (PEPCID) 20 MG tablet    Sig: Take 1 tablet (20 mg total) by mouth 2 (two) times daily.    Dispense:  60 tablet    Refill:  3   Lab Orders         C-reactive protein         Tryptase         C3 and C4         Alpha-Gal Panel         ANA w/Reflex         CBC w/Diff/Platelet         Chronic Urticaria         Comprehensive metabolic panel         Thyroid Cascade Profile         Sedimentation rate         Allergens w/Total IgE Area 2         Allergen, Banana f92         Allergen, Strawberry, f44      Other allergy screening: Asthma:  Some shortness of breath with exertion.  Uses albuterol as needed with good benefit.  Using it daily since prescribed 1 month ago.  Followed by pulmonology in the past due to PE provoked after hysterectomy.  CT chest 02/12/2022: "IMPRESSION: There is no evidence of pulmonary artery embolism. There is no evidence of thoracic aortic dissection. There is no focal pulmonary consolidation.   Small linear densities in the lower lung fields suggest scarring or subsegmental atelectasis."  Rhino conjunctivitis: yes Increased sneezing, nasal congestion, rhinorrhea recently. No prior allergy testing.  Food allergy:  currently avoiding strawberries, bananas due to concerns about hives. Medication allergy: no Hymenoptera allergy:  no Large localized reactions.  History of recurrent infections suggestive of immunodeficency: no  Diagnostics: Spirometry:  Tracings reviewed. Her effort: Good reproducible efforts. FVC: 1.68L FEV1: 1.36L, 61% predicted FEV1/FVC ratio: 81% Interpretation: Spirometry consistent with possible restrictive disease with 2% improvement in FEV1 post bronchodilator treatment. Clinically feeling slightly improved.   Please see scanned spirometry results for details.  Skin Testing: Environmental allergy panel and select foods. Positive to mold and dog.  Results discussed with patient/family.  Airborne Adult Perc - 04/18/22 1403     Time Antigen Placed 1408    Allergen Manufacturer Lavella Hammock    Location Back    Number of Test 59    1. Control-Buffer 50% Glycerol Negative    2. Control-Histamine 1 mg/ml 2+    3. Albumin saline Negative    4. [place] Negative    5. [place] Negative    6. Johnson Negative    7. Shelton Blue Negative    8. Meadow Fescue  Negative    9. Perennial Rye Negative    10. Sweet Vernal Negative    11. Timothy Negative    12. Cocklebur Negative    13. Burweed Marshelder Negative    14. Ragweed, short Negative    15. Ragweed, Giant Negative    16. Plantain,  English Negative    17. Lamb's Quarters Negative    18. Sheep Sorrell Negative    19. Rough Pigweed Negative    20. Marsh Elder, Rough Negative    21. Mugwort, Common Negative    22. Ash mix Negative    23. Birch mix Negative    24. Beech American Negative    25. Box, Elder Negative    26. Cedar, red Negative    27. Cottonwood, [place] Negative    28. Elm mix Negative    29. Hickory Negative    30. Maple mix Negative    31. Oak, [place] mix Negative    32. Pecan Pollen Negative    33. Pine mix Negative    34. Sycamore Eastern Negative    35. [place], Black Pollen Negative    36. Alternaria alternata Negative    37. Cladosporium Herbarum Negative    38. Aspergillus mix Negative    39. Penicillium mix  Negative    40. Bipolaris sorokiniana (Helminthosporium) Negative    41. Drechslera spicifera (Curvularia) 2+    42. Mucor plumbeus Negative    43. Fusarium moniliforme Negative    44. Aureobasidium pullulans (pullulara) Negative    45. Rhizopus oryzae Negative    46. Botrytis cinera Negative    47. Epicoccum nigrum Negative    48. Phoma betae Negative    49. Candida Albicans Negative    50. Trichophyton mentagrophytes Negative    51. Mite, D Farinae  5,000 AU/ml Negative    52. Mite, D Pteronyssinus  5,000 AU/ml Negative    53. Cat Hair 10,000 BAU/ml Negative    54.  Dog Epithelia 2+    55. Mixed Feathers Negative    56. Horse Epithelia Negative    57. Cockroach, German Negative    58. Mouse Negative    59. Tobacco Leaf Negative             Food Adult Perc - 04/18/22 1400     Time Antigen Placed 1408    Allergen Manufacturer Lavella Hammock    Location Back    Number of allergen test 11    1. Peanut Negative    2. Soybean Negative    3. Wheat Negative    4. Sesame Negative    5. Milk, cow Negative    6. Egg White, Chicken Negative    7. Casein Negative    8. Shellfish Mix Negative    9. Fish Mix Negative    57. Banana Negative    60. Strawberry Negative             Past Medical History: Patient Active Problem List   Diagnosis Date Noted   Urticaria 04/19/2022   Shortness of breath 04/19/2022   Other allergic rhinitis 04/19/2022   Pulmonary embolism ([place]) 11/30/2021   Smoking 10/26/2021   Postoperative intra-abdominal abscess    Medication monitoring encounter    Pelvic infection in female 09/24/2021   Status post laparoscopic hysterectomy 09/11/2021   Cholelithiasis 01/13/2019   Pre-diabetes 11/15/2016   History of bilateral tubal ligation 09/28/2015   Tobacco abuse 09/28/2015   Past Medical History:  Diagnosis Date   Abnormal Pap smear of  cervix    Anxiety    Depression    Dysmenorrhea    Eczema    History of COVID-19 10/24/2020   loss of taste and  smellbody aches and cough x 4 days all symptoms resolved   History of kidney stones    Hypertension    Pre-diabetes    Rash 08/31/2021   comes and goes on legs saw dermetology no rash now ? food allergy   SUI (stress urinary incontinence, female)    Trichomonas infection 02/2021   Uterine fibroid    Past Surgical History: Past Surgical History:  Procedure Laterality Date   BLADDER SUSPENSION N/A 09/11/2021   Procedure: TRANSVAGINAL TAPE (TVT) PROCEDURE;  Surgeon: [name], MD;  Location: [facility];  Service: Gynecology;  Laterality: N/A;   CESAREAN SECTION  1997   x 1   CHOLECYSTECTOMY N/A 01/14/2019   Procedure: LAPAROSCOPIC CHOLECYSTECTOMY WITH POSSIBLE  INTRAOPERATIVE CHOLANGIOGRAM;  Surgeon: Armandina Gemma, MD;  Location: [facility];  Service: General;  Laterality: N/A;   CYSTOSCOPY N/A 09/11/2021   Procedure: Consuela Mimes;  Surgeon: [name], MD;  Location: [facility];  Service: Gynecology;  Laterality: N/A;   CYSTOSCOPY/URETEROSCOPY/HOLMIUM LASER Left 01/31/2022   Procedure: LEFT URETEROSCOPY/HOLMIUM LASER;  Surgeon: Vira Agar, MD;  Location: [facility];  Service: Urology;  Laterality: Left;   DILITATION & CURRETTAGE/HYSTROSCOPY WITH HYDROTHERMAL ABLATION N/A 06/22/2016   Procedure: DILATATION & CURETTAGE/HYSTEROSCOPY WITH HYDROTHERMAL ABLATION;  Surgeon: Shelly Bombard, MD;  Location: [facility];  Service: Gynecology;  Laterality: N/A;   TOTAL LAPAROSCOPIC HYSTERECTOMY WITH SALPINGECTOMY Bilateral 09/11/2021   Procedure: TOTAL LAPAROSCOPIC HYSTERECTOMY WITH SALPINGECTOMY;  Surgeon: [name], MD;  Location: [facility];  Service: Gynecology;  Laterality: Bilateral;   TUBAL LIGATION     09-28-2015   Medication List:  Current Outpatient Medications  Medication Sig Dispense Refill   albuterol (VENTOLIN HFA) 108 (90 Base) MCG/ACT inhaler Inhale 2 puffs into the lungs every 6 (six) hours as  needed for wheezing or shortness of breath. 8 g 2   amLODipine (NORVASC) 10 MG tablet Take 10 mg by mouth every evening.     EPINEPHrine (EPIPEN 2-PAK) 0.3 mg/0.3 mL IJ SOAJ injection Inject 0.3 mg into the muscle as needed for anaphylaxis. 2 each 0   famotidine (PEPCID) 20 MG tablet Take 1 tablet (20 mg total) by mouth 2 (two) times daily. 60 tablet 3   hydrOXYzine (VISTARIL) 25 MG capsule Take 1 capsule (25 mg total) by mouth every 8 (eight) hours as needed. 30 capsule 2   metFORMIN (GLUCOPHAGE) 500 MG tablet Take 1 tablet (500 mg total) by mouth 2 (two) times daily with a meal. 180 tablet 0   nicotine (NICODERM CQ - DOSED IN MG/24 HOURS) 14 mg/24hr patch Place 1 patch (14 mg total) onto the skin daily. 28 patch 2   sertraline (ZOLOFT) 100 MG tablet Take 1 tablet (100 mg total) by mouth at bedtime. 30 tablet 3   No current facility-administered medications for this visit.   Allergies: Allergies  Allergen Reactions   Tramadol Other (See Comments)    Headache   Social History: Social History   Socioeconomic History   Marital status: Married    Spouse name: Not on file   Number of children: Not on file   Years of education: Not on file   Highest education level: Not on file  Occupational History   Not on file  Tobacco Use   Smoking status: Former    Packs/day: 0.10    Years: 20.00    Total pack years: 2.00    Types: Cigarettes    Quit date: 10/01/2021    Years since quitting: 0.5    Passive exposure: Never   Smokeless tobacco: Never  Vaping Use   Vaping Use: Former  Substance and Sexual Activity   Alcohol use: Not Currently   Drug use: Never   Sexual activity: Not Currently    Partners: Male    Birth control/protection: Surgical    Comment: [place] 09/11/21  Other Topics Concern   Not on file  Social History Narrative   Not on file   Social Determinants of Health   Financial Resource Strain: Not on file  Food Insecurity: Not on file  Transportation Needs: Not on file   Physical Activity: Not on file  Stress: Not on file  Social Connections: Not on file   Lives in an apartment. Smoking: using nicotine patch x 1 month. 1/2 pack per day smoker in the past.  Occupation: front end office assistant  Environmental History: Water Damage/mildew in the house: no Carpet in the family room: yes Carpet in the bedroom: no Heating: electric Cooling: central Pet: no  Family History: Family History  Problem Relation Age of Onset   Hypertension Maternal Grandmother    Diabetes Maternal Grandfather    Allergic rhinitis Daughter    Allergic rhinitis Daughter    Allergic rhinitis Son    Allergic rhinitis Son    Colon cancer Neg Hx    Esophageal cancer Neg Hx    Rectal cancer Neg Hx    Stomach cancer Neg Hx    Review of Systems  Constitutional:  Negative for appetite change, chills, fever and unexpected weight change.  HENT:  Negative for congestion and rhinorrhea.   Eyes:  Negative for itching.  Respiratory:  Positive for shortness of breath. Negative for cough, chest tightness and wheezing.   Cardiovascular:  Negative for chest pain.  Gastrointestinal:  Negative for abdominal pain.  Genitourinary:  Negative for difficulty urinating.  Skin:  Positive for rash.  Allergic/Immunologic: Positive for environmental allergies.  Neurological:  Negative for headaches.    Objective: BP (!) 142/90   Pulse (!) 103   Temp 98 F (36.7 C) (Temporal)   Resp 16   Ht 5' 0.63" (1.54 m)   Wt 196 lb 6.4 oz (89.1 kg)   LMP 08/18/2021 (Exact Date)   SpO2 97%   BMI 37.56 kg/m  Body mass index is 37.56 kg/m. Physical Exam Vitals and nursing note reviewed.  Constitutional:      Appearance: Normal appearance. She is well-developed. She is obese.  HENT:     Head: Normocephalic and atraumatic.     Right Ear: Tympanic membrane and external ear normal.     Left Ear: Tympanic membrane and external ear normal.     Nose: Nose normal.     Mouth/Throat:     Mouth:  Mucous membranes are moist.     Pharynx: Oropharynx is clear.  Eyes:     Conjunctiva/sclera: Conjunctivae normal.  Cardiovascular:     Rate and Rhythm: Normal rate and regular rhythm.     Heart sounds: Normal heart sounds. No murmur heard.    No friction rub. No gallop.  Pulmonary:     Effort: Pulmonary effort is normal.     Breath sounds: Normal breath sounds. No wheezing, rhonchi or rales.  Musculoskeletal:  Cervical back: Neck supple.  Skin:    General: Skin is warm.     Findings: No rash.  Neurological:     Mental Status: She is alert and oriented to person, place, and time.  Psychiatric:        Behavior: Behavior normal.   The plan was reviewed with the patient/family, and all questions/concerned were addressed.  It was my pleasure to see Brianna Gutierrez today and participate in her care. Please feel free to contact me with any questions or concerns.  Sincerely,  Rexene Alberts, DO Allergy & Immunology  [facility] office: [place] office: [phone]

## 2022-04-18 ENCOUNTER — Ambulatory Visit (INDEPENDENT_AMBULATORY_CARE_PROVIDER_SITE_OTHER): Payer: BC Managed Care – PPO | Admitting: Allergy

## 2022-04-18 ENCOUNTER — Encounter: Payer: Self-pay | Admitting: Allergy

## 2022-04-18 VITALS — BP 142/90 | HR 103 | Temp 98.0°F | Resp 16 | Ht 60.63 in | Wt 196.4 lb

## 2022-04-18 DIAGNOSIS — L509 Urticaria, unspecified: Secondary | ICD-10-CM

## 2022-04-18 DIAGNOSIS — R0602 Shortness of breath: Secondary | ICD-10-CM | POA: Diagnosis not present

## 2022-04-18 DIAGNOSIS — L299 Pruritus, unspecified: Secondary | ICD-10-CM | POA: Diagnosis not present

## 2022-04-18 DIAGNOSIS — J3089 Other allergic rhinitis: Secondary | ICD-10-CM | POA: Diagnosis not present

## 2022-04-18 DIAGNOSIS — T781XXD Other adverse food reactions, not elsewhere classified, subsequent encounter: Secondary | ICD-10-CM

## 2022-04-18 DIAGNOSIS — T781XXA Other adverse food reactions, not elsewhere classified, initial encounter: Secondary | ICD-10-CM

## 2022-04-18 MED ORDER — FAMOTIDINE 20 MG PO TABS: 20.0000 mg | ORAL_TABLET | Freq: Two times a day (BID) | ORAL | 3 refills | Status: DC

## 2022-04-18 NOTE — Patient Instructions (Addendum)
Today's skin testing showed: Positive to mold and dog.  Negative to foods.   Results given.  Skin: See below for proper skin care. Use fragrance free and dye free products. No dryer sheets or fabric softener.  Start zyrtec (cetirizine) '10mg'$  twice a day. If symptoms are not controlled or causes drowsiness let us know. Start pepcid (famotidine) '20mg'$  twice a day.  Avoid the following potential triggers: alcohol, tight clothing, NSAIDs, hot showers and getting overheated. Get bloodwork:  If negative to banana and strawberry then will reintroduce at home.  We are ordering labs, so please allow 1-2 weeks for the results to come back. With the newly implemented Cures Act, the labs might be visible to you at the same time that they become visible to me. However, I will not address the results until all of the results are back, so please be patient.    Environmental allergies Start environmental control measures as below. The zyrtec recommend as above should also help with these symptoms.   Shortness of breath Monitor symptoms. Only use albuterol if you have symptoms more than 5 minutes after your stop your activities. May use albuterol rescue inhaler 2 puffs every 4 to 6 hours as needed for shortness of breath, chest tightness, coughing, and wheezing.  Monitor frequency of use.   Elevated blood pressure Please follow up with your PCP regarding this.  Follow up in 6 weeks or sooner if needed.   Skin care recommendations  Bath time: Always use lukewarm water. AVOID very hot or cold water. Keep bathing time to 5-10 minutes. Do NOT use bubble bath. Use a mild soap and use just enough to wash the dirty areas. Do NOT scrub skin vigorously.  After bathing, pat dry your skin with a towel. Do NOT rub or scrub the skin.  Moisturizers and prescriptions:  ALWAYS apply moisturizers immediately after bathing (within 3 minutes). This helps to lock-in moisture. Use the moisturizer several times  a day over the whole body. Good summer moisturizers include: Aveeno, CeraVe, Cetaphil. Good winter moisturizers include: Aquaphor, Vaseline, Cerave, Cetaphil, Eucerin, Vanicream. When using moisturizers along with medications, the moisturizer should be applied about one hour after applying the medication to prevent diluting effect of the medication or moisturize around where you applied the medications. When not using medications, the moisturizer can be continued twice daily as maintenance.  Laundry and clothing: Avoid laundry products with added color or perfumes. Use unscented hypo-allergenic laundry products such as Tide free, Cheer free & gentle, and All free and clear.  If the skin still seems dry or sensitive, you can try double-rinsing the clothes. Avoid tight or scratchy clothing such as wool. Do not use fabric softeners or dyer sheets.   Mold Control Mold and fungi can grow on a variety of surfaces provided certain temperature and moisture conditions exist.  Outdoor molds grow on plants, decaying vegetation and soil. The major outdoor mold, Alternaria and Cladosporium, are found in very high numbers during hot and dry conditions. Generally, a late summer - fall peak is seen for common outdoor fungal spores. Rain will temporarily lower outdoor mold spore count, but counts rise rapidly when the rainy period ends. The most important indoor molds are Aspergillus and Penicillium. Dark, humid and poorly ventilated basements are ideal sites for mold growth. The next most common sites of mold growth are the bathroom and the kitchen. Outdoor (Seasonal) Mold Control Use air conditioning and keep windows closed. Avoid exposure to decaying vegetation. Avoid leaf raking. Avoid  grain handling. Consider wearing a face mask if working in moldy areas.  Indoor (Perennial) Mold Control  Maintain humidity below 50%. Get rid of mold growth on hard surfaces with water, detergent and, if necessary, 5%  bleach (do not mix with other cleaners). Then dry the area completely. If mold covers an area more than 10 square feet, consider hiring an indoor environmental professional. For clothing, washing with soap and water is best. If moldy items cannot be cleaned and dried, throw them away. Remove sources e.g. contaminated carpets. Repair and seal leaking roofs or pipes. Using dehumidifiers in damp basements may be helpful, but empty the water and clean units regularly to prevent mildew from forming. All rooms, especially basements, bathrooms and kitchens, require ventilation and cleaning to deter mold and mildew growth. Avoid carpeting on concrete or damp floors, and storing items in damp areas. Pet Allergen Avoidance: Contrary to popular opinion, there are no "hypoallergenic" breeds of dogs or cats. That is because people are not allergic to an animal's hair, but to an allergen found in the animal's saliva, dander (dead skin flakes) or urine. Pet allergy symptoms typically occur within minutes. For some people, symptoms can build up and become most severe 8 to 12 hours after contact with the animal. People with severe allergies can experience reactions in public places if dander has been transported on the pet owners' clothing. Keeping an animal outdoors is only a partial solution, since homes with pets in the yard still have higher concentrations of animal allergens. Before getting a pet, ask your allergist to determine if you are allergic to animals. If your pet is already considered part of your family, try to minimize contact and keep the pet out of the bedroom and other rooms where you spend a great deal of time. As with dust mites, vacuum carpets often or replace carpet with a hardwood floor, tile or linoleum. High-efficiency particulate air (HEPA) cleaners can reduce allergen levels over time. While dander and saliva are the source of cat and dog allergens, urine is the source of allergens from rabbits,  hamsters, mice and [place] pigs; so ask a non-allergic family member to clean the animal's cage. If you have a pet allergy, talk to your allergist about the potential for allergy immunotherapy (allergy shots). This strategy can often provide long-term relief.

## 2022-04-19 ENCOUNTER — Encounter: Payer: Self-pay | Admitting: Allergy

## 2022-04-19 DIAGNOSIS — J3089 Other allergic rhinitis: Secondary | ICD-10-CM | POA: Insufficient documentation

## 2022-04-19 DIAGNOSIS — R0602 Shortness of breath: Secondary | ICD-10-CM | POA: Insufficient documentation

## 2022-04-19 DIAGNOSIS — L501 Idiopathic urticaria: Secondary | ICD-10-CM | POA: Insufficient documentation

## 2022-04-19 DIAGNOSIS — L509 Urticaria, unspecified: Secondary | ICD-10-CM | POA: Insufficient documentation

## 2022-04-19 NOTE — Assessment & Plan Note (Signed)
>>  ASSESSMENT AND PLAN FOR PRURITUS/URTICARIA WRITTEN ON 04/19/2022  5:04 PM BY Garnet Sierras, DO  Pruritic rash for 1 year lasting 1 day at a time.  No triggers noted.  Occurs a few times per month.  Concerned whether strawberries, bananas and seafood may be contributing to the symptoms.  Saw dermatology virtually in the past with no benefit. Today's skin prick testing showed: Positive to mold and dog.  Negative to foods.  Based on clinical history concerning for urticaria.  See below for proper skin care. Use fragrance free and dye free products. No dryer sheets or fabric softener.  Start zyrtec (cetirizine) 86m twice a day. If symptoms are not controlled or causes drowsiness let [place]. Start Pepcid (famotidine) 263mtwice a day.  Avoid the following potential triggers: alcohol, tight clothing, NSAIDs, hot showers and getting overheated. Get bloodwork to rule out other etiologies.  If negative to banana and strawberry then will reintroduce at home.

## 2022-04-19 NOTE — Assessment & Plan Note (Signed)
Dyspnea on exertion and uses albuterol as needed with good benefit.  Has been using it daily since prescribed 1 month ago.  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  CT chest in April 2023 was unremarkable.  Today's spirometry showed restriction with 2% improvement FEV1 postbronchodilator treatment.  Clinically feeling swelling improved. . Monitor symptoms. . Only use albuterol if you have symptoms more than 5 minutes after you stop your activities. o If symptoms resolve within a few minutes then shortness of breath most likely due to physical deconditioning. . May use albuterol rescue inhaler 2 puffs every 4 to 6 hours as needed for shortness of breath, chest tightness, coughing, and wheezing.  Monitor frequency of use.

## 2022-04-19 NOTE — Assessment & Plan Note (Signed)
Increased rhinitis symptoms recently.  No prior allergy testing.  Today's skin prick testing showed: Positive to mold and dog.   Will get blood work instead of intradermal testing as need to get bloodwork for other reasons.   Start environmental control measures as below.  The zyrtec recommend as above should also help with these symptoms.

## 2022-04-24 ENCOUNTER — Other Ambulatory Visit: Payer: Self-pay | Admitting: Family

## 2022-04-24 MED ORDER — AMLODIPINE BESYLATE 10 MG PO TABS: 10.0000 mg | ORAL_TABLET | Freq: Every evening | ORAL | 0 refills | Status: DC

## 2022-04-24 NOTE — Telephone Encounter (Signed)
Requested medication (s) are due for refill today: yes  Requested medication (s) are on the active medication list: yes  Last refill:  03/07/21  Future visit scheduled: no  Notes to clinic:  historical med. Pt was seen by Archie Patten, NP for this 8 months ago. Please assess for refill.      Requested Prescriptions  Pending Prescriptions Disp Refills   amLODipine (NORVASC) 10 MG tablet      Sig: Take 1 tablet (10 mg total) by mouth every evening.     Cardiovascular: Calcium Channel Blockers 2 Failed - 04/24/2022 12:33 PM      Failed - Last BP in normal range    BP Readings from Last 1 Encounters:  04/18/22 (!) 142/90         Passed - Last Heart Rate in normal range    Pulse Readings from Last 1 Encounters:  04/18/22 (!) 103         Passed - Valid encounter within last 6 months    Recent Outpatient Visits           1 month ago Anxiety and depression   Primary Care at [facility], [place], NP   2 months ago Anxiety and depression   Primary Care at [facility], Amy J, NP   5 months ago Anxiety and depression   Primary Care at [facility], Salomon Fick, NP   6 months ago Hospital discharge follow-up   Primary Care at [facility], Amy J, NP   8 months ago Essential hypertension   Primary Care at [facility], Gildardo Pounds, NP       Future Appointments             In 2 months Ellamae Sia, DO [facility]

## 2022-04-25 ENCOUNTER — Emergency Department (HOSPITAL_COMMUNITY): Payer: BC Managed Care – PPO

## 2022-04-25 ENCOUNTER — Other Ambulatory Visit: Payer: Self-pay

## 2022-04-25 ENCOUNTER — Ambulatory Visit
Admission: EM | Admit: 2022-04-25 | Discharge: 2022-04-25 | Payer: BC Managed Care – PPO | Attending: Family Medicine | Admitting: Family Medicine

## 2022-04-25 ENCOUNTER — Emergency Department (HOSPITAL_COMMUNITY)
Admission: EM | Admit: 2022-04-25 | Discharge: 2022-04-26 | Disposition: A | Discharge status: Home / Self Care | Payer: BC Managed Care – PPO | Attending: Emergency Medicine | Admitting: Emergency Medicine

## 2022-04-25 DIAGNOSIS — R0600 Dyspnea, unspecified: Secondary | ICD-10-CM | POA: Insufficient documentation

## 2022-04-25 DIAGNOSIS — Z87891 Personal history of nicotine dependence: Secondary | ICD-10-CM | POA: Diagnosis not present

## 2022-04-25 DIAGNOSIS — R0789 Other chest pain: Secondary | ICD-10-CM | POA: Insufficient documentation

## 2022-04-25 DIAGNOSIS — Z20822 Contact with and (suspected) exposure to covid-19: Secondary | ICD-10-CM | POA: Diagnosis not present

## 2022-04-25 DIAGNOSIS — R519 Headache, unspecified: Secondary | ICD-10-CM | POA: Insufficient documentation

## 2022-04-25 DIAGNOSIS — R0602 Shortness of breath: Secondary | ICD-10-CM | POA: Diagnosis not present

## 2022-04-25 DIAGNOSIS — G4489 Other headache syndrome: Secondary | ICD-10-CM | POA: Diagnosis not present

## 2022-04-25 DIAGNOSIS — I10 Essential (primary) hypertension: Secondary | ICD-10-CM | POA: Diagnosis not present

## 2022-04-25 LAB — BASIC METABOLIC PANEL
Anion gap: 9 (ref 5–15)
BUN: 12 mg/dL (ref 6–20)
CO2: 24 mmol/L (ref 22–32)
Calcium: 9 mg/dL (ref 8.9–10.3)
Chloride: 106 mmol/L (ref 98–111)
Creatinine, Ser: 0.82 mg/dL (ref 0.44–1.00)
GFR, Estimated: >60 mL/min (ref >60)
Glucose, Bld: 101 mg/dL — ABNORMAL HIGH (ref 70–99)
Potassium: 3.4 mmol/L — ABNORMAL LOW (ref 3.5–5.1)
Sodium: 139 mmol/L (ref 135–145)

## 2022-04-25 LAB — CBC WITH DIFFERENTIAL/PLATELET
Abs Immature Granulocytes: 0.02 10*3/uL (ref 0.00–0.07)
Basophils Absolute: 0 10*3/uL (ref 0.0–0.1)
Basophils Relative: 0 %
Eosinophils Absolute: 0.2 10*3/uL (ref 0.0–0.5)
Eosinophils Relative: 2 %
HCT: 46 % (ref 36.0–46.0)
Hemoglobin: 14.4 g/dL (ref 12.0–15.0)
Immature Granulocytes: 0 %
Lymphocytes Relative: 24 %
Lymphs Abs: 2.1 10*3/uL (ref 0.7–4.0)
MCH: 22.3 pg — ABNORMAL LOW (ref 26.0–34.0)
MCHC: 31.3 g/dL (ref 30.0–36.0)
MCV: 71.2 fL — ABNORMAL LOW (ref 80.0–100.0)
Monocytes Absolute: 0.9 10*3/uL (ref 0.1–1.0)
Monocytes Relative: 9 %
Neutro Abs: 5.9 10*3/uL (ref 1.7–7.7)
Neutrophils Relative %: 65 %
Platelets: 372 10*3/uL (ref 150–400)
RBC: 6.46 MIL/uL — ABNORMAL HIGH (ref 3.87–5.11)
RDW: 18.3 % — ABNORMAL HIGH (ref 11.5–15.5)
WBC: 9.1 10*3/uL (ref 4.0–10.5)
nRBC: 0 % (ref 0.0–0.2)

## 2022-04-25 LAB — RESP PANEL BY RT-PCR (FLU A&B, COVID) ARPGX2
Influenza A by PCR: NEGATIVE
Influenza B by PCR: NEGATIVE
SARS Coronavirus 2 by RT PCR: NEGATIVE

## 2022-04-25 LAB — D-DIMER, QUANTITATIVE: D-Dimer, Quant: 0.29 ug/mL-FEU (ref 0.00–0.50)

## 2022-04-25 LAB — TROPONIN I (HIGH SENSITIVITY)
Troponin I (High Sensitivity): 4 ng/L (ref <18)
Troponin I (High Sensitivity): 4 ng/L (ref <18)

## 2022-04-25 LAB — BRAIN NATRIURETIC PEPTIDE: B Natriuretic Peptide: 22.3 pg/mL (ref 0.0–100.0)

## 2022-04-25 LAB — MAGNESIUM: Magnesium: 2.2 mg/dL (ref 1.7–2.4)

## 2022-04-25 LAB — HCG, QUANTITATIVE, PREGNANCY: hCG, Beta Chain, Quant, S: <1 m[IU]/mL (ref <5)

## 2022-04-25 MED ORDER — NICOTINE 7 MG/24HR TD PT24
7.0000 mg | MEDICATED_PATCH | Freq: Once | TRANSDERMAL | Status: DC
  Administered 2022-04-25: 7 mg via TRANSDERMAL
  Filled 2022-04-25: qty 1

## 2022-04-25 MED ORDER — METOCLOPRAMIDE HCL 5 MG/ML IJ SOLN
5.0000 mg | Freq: Once | INTRAMUSCULAR | Status: AC
  Administered 2022-04-25: 5 mg via INTRAVENOUS
  Filled 2022-04-25: qty 2

## 2022-04-25 MED ORDER — MAGNESIUM SULFATE 2 GM/50ML IV SOLN
2.0000 g | Freq: Once | INTRAVENOUS | Status: AC
  Administered 2022-04-25: 2 g via INTRAVENOUS
  Filled 2022-04-25: qty 50

## 2022-04-25 MED ORDER — LIDOCAINE 5 % EX PTCH
1.0000 | MEDICATED_PATCH | CUTANEOUS | Status: DC
Start: 2022-04-25 — End: 2022-04-26
  Administered 2022-04-25: 1 via TRANSDERMAL
  Filled 2022-04-25: qty 1

## 2022-04-25 MED ORDER — IPRATROPIUM-ALBUTEROL 0.5-2.5 (3) MG/3ML IN SOLN
3.0000 mL | Freq: Once | RESPIRATORY_TRACT | Status: AC
  Administered 2022-04-25: 3 mL via RESPIRATORY_TRACT
  Filled 2022-04-25: qty 3

## 2022-04-25 MED ORDER — AMLODIPINE BESYLATE 5 MG PO TABS
10.0000 mg | ORAL_TABLET | Freq: Once | ORAL | Status: AC
  Administered 2022-04-25: 10 mg via ORAL
  Filled 2022-04-25: qty 2

## 2022-04-25 MED ORDER — DIPHENHYDRAMINE HCL 50 MG/ML IJ SOLN
25.0000 mg | Freq: Once | INTRAMUSCULAR | Status: AC
  Administered 2022-04-25: 25 mg via INTRAVENOUS
  Filled 2022-04-25: qty 1

## 2022-04-25 MED ORDER — ONDANSETRON HCL 4 MG/2ML IJ SOLN
4.0000 mg | Freq: Once | INTRAMUSCULAR | Status: AC
  Administered 2022-04-25: 4 mg via INTRAVENOUS
  Filled 2022-04-25: qty 2

## 2022-04-25 MED ORDER — BUTALBITAL-APAP-CAFFEINE 50-325-40 MG PO TABS: 1.0000 | ORAL_TABLET | Freq: Four times a day (QID) | ORAL | 0 refills | Status: DC | PRN

## 2022-04-25 MED ORDER — SODIUM CHLORIDE 0.9 % IV BOLUS
1000.0000 mL | Freq: Once | INTRAVENOUS | Status: AC
Start: 2022-04-25 — End: 2022-04-25
  Administered 2022-04-25: 1000 mL via INTRAVENOUS

## 2022-04-25 MED ORDER — HYDROXYZINE HCL 25 MG PO TABS
25.0000 mg | ORAL_TABLET | Freq: Once | ORAL | Status: AC
  Administered 2022-04-25: 25 mg via ORAL
  Filled 2022-04-25: qty 1

## 2022-04-25 MED ORDER — HYDROMORPHONE HCL 1 MG/ML IJ SOLN
1.0000 mg | Freq: Once | INTRAMUSCULAR | Status: AC
  Administered 2022-04-25: 1 mg via INTRAVENOUS
  Filled 2022-04-25: qty 1

## 2022-04-25 NOTE — Discharge Instructions (Addendum)
Patient will be transported to the emergency room for higher level of care

## 2022-04-25 NOTE — ED Provider Notes (Addendum)
[name]    CSN: [id number] Arrival date & time: 04/25/22  1640      History   Chief Complaint Chief Complaint  Patient presents with   Shortness of Breath   Hypertension    HPI Brianna Gutierrez is a 46 y.o. female.    Shortness of Breath Hypertension Associated symptoms include shortness of breath.    Here for dizziness, shortness of breath, and feeling like her blood pressure is elevated.  Brianna Gutierrez does take amlodipine for blood pressure.  Brianna Gutierrez has had a history of bronchitis and has used an inhaler previously.  Brianna Gutierrez did try her inhaler earlier today and it has not helped  Past Medical History:  Diagnosis Date   Abnormal Pap smear of cervix    Anxiety    Depression    Dysmenorrhea    Eczema    History of COVID-19 10/24/2020   loss of taste and smellbody aches and cough x 4 days all symptoms resolved   History of kidney stones    Hypertension    Pre-diabetes    Rash 08/31/2021   comes and goes on legs saw dermetology no rash now ? food allergy   SUI (stress urinary incontinence, female)    Trichomonas infection 02/2021   Uterine fibroid     Patient Active Problem List   Diagnosis Date Noted   Urticaria 04/19/2022   Shortness of breath 04/19/2022   Other allergic rhinitis 04/19/2022   Pulmonary embolism ([place]) 11/30/2021   Smoking 10/26/2021   Postoperative intra-abdominal abscess    Medication monitoring encounter    Pelvic infection in female 09/24/2021   Status post laparoscopic hysterectomy 09/11/2021   Cholelithiasis 01/13/2019   Pre-diabetes 11/15/2016   History of bilateral tubal ligation 09/28/2015   Tobacco abuse 09/28/2015    Past Surgical History:  Procedure Laterality Date   BLADDER SUSPENSION N/A 09/11/2021   Procedure: TRANSVAGINAL TAPE (TVT) PROCEDURE;  Surgeon: [name], MD;  Location: [facility];  Service: Gynecology;  Laterality: N/A;   CESAREAN SECTION  1997   x 1    CHOLECYSTECTOMY N/A 01/14/2019   Procedure: LAPAROSCOPIC CHOLECYSTECTOMY WITH POSSIBLE  INTRAOPERATIVE CHOLANGIOGRAM;  Surgeon: Armandina Gemma, MD;  Location: [facility];  Service: General;  Laterality: N/A;   CYSTOSCOPY N/A 09/11/2021   Procedure: Consuela Mimes;  Surgeon: [name], MD;  Location: [facility];  Service: Gynecology;  Laterality: N/A;   CYSTOSCOPY/URETEROSCOPY/HOLMIUM LASER Left 01/31/2022   Procedure: LEFT URETEROSCOPY/HOLMIUM LASER;  Surgeon: Vira Agar, MD;  Location: [facility];  Service: Urology;  Laterality: Left;   DILITATION & CURRETTAGE/HYSTROSCOPY WITH HYDROTHERMAL ABLATION N/A 06/22/2016   Procedure: DILATATION & CURETTAGE/HYSTEROSCOPY WITH HYDROTHERMAL ABLATION;  Surgeon: Shelly Bombard, MD;  Location: [facility];  Service: Gynecology;  Laterality: N/A;   TOTAL LAPAROSCOPIC HYSTERECTOMY WITH SALPINGECTOMY Bilateral 09/11/2021   Procedure: TOTAL LAPAROSCOPIC HYSTERECTOMY WITH SALPINGECTOMY;  Surgeon: [name], MD;  Location[facility];  Service: Gynecology;  Laterality: Bilateral;   TUBAL LIGATION     09-28-2015    OB History     Gravida  4   Para      Term      Preterm      AB      Living  4      SAB      IAB      Ectopic      Multiple      Live Births  4            Home Medications    Prior to Admission medications   Medication Sig Start Date End Date Taking? Authorizing Provider  albuterol (VENTOLIN HFA) 108 (90 Base) MCG/ACT inhaler Inhale 2 puffs into the lungs every 6 (six) hours as needed for wheezing or shortness of breath. 02/14/22   Cobb, Karie Schwalbe, NP  amLODipine (NORVASC) 10 MG tablet Take 1 tablet (10 mg total) by mouth every evening. 04/24/22   [name], NP  EPINEPHrine (EPIPEN 2-PAK) 0.3 mg/0.3 mL IJ SOAJ injection Inject 0.3 mg into the muscle as needed for anaphylaxis. 03/15/22   [name], NP  famotidine (PEPCID) 20 MG tablet Take 1 tablet (20 mg total)  by mouth 2 (two) times daily. 04/18/22   Garnet Sierras, DO  hydrOXYzine (VISTARIL) 25 MG capsule Take 1 capsule (25 mg total) by mouth every 8 (eight) hours as needed. 02/13/22   [name], NP  metFORMIN (GLUCOPHAGE) 500 MG tablet Take 1 tablet (500 mg total) by mouth 2 (two) times daily with a meal. 09/29/21 01/20/24  Kayleen Memos, DO  nicotine (NICODERM CQ - DOSED IN MG/24 HOURS) 14 mg/24hr patch Place 1 patch (14 mg total) onto the skin daily. 12/19/21   [name], NP  sertraline (ZOLOFT) 100 MG tablet Take 1 tablet (100 mg total) by mouth at bedtime. 02/13/22 06/13/22  [name], NP  dicyclomine (BENTYL) 20 MG tablet Take 1 tablet (20 mg total) by mouth 2 (two) times daily. 10/29/19 08/24/20  Ok Edwards, PA-C  omeprazole (PRILOSEC) 20 MG capsule Take 1 capsule (20 mg total) by mouth daily. 01/01/19 08/07/19  Scot Jun, FNP    Family History Family History  Problem Relation Age of Onset   Hypertension Maternal Grandmother    Diabetes Maternal Grandfather    Allergic rhinitis Daughter    Allergic rhinitis Daughter    Allergic rhinitis Son    Allergic rhinitis Son    Colon cancer Neg Hx    Esophageal cancer Neg Hx    Rectal cancer Neg Hx    Stomach cancer Neg Hx     Social History Social History   Tobacco Use   Smoking status: Former    Packs/day: 0.10    Years: 20.00    Total pack years: 2.00    Types: Cigarettes    Quit date: 10/01/2021    Years since quitting: 0.5    Passive exposure: Never   Smokeless tobacco: Never  Vaping Use   Vaping Use: Former  Substance Use Topics   Alcohol use: Not Currently   Drug use: Never     Allergies   Tramadol   Review of Systems Review of Systems  Respiratory:  Positive for shortness of breath.      Physical Exam Triage Vital Signs ED Triage Vitals  Enc Vitals Group     BP 04/25/22 1654 (!) 147/100     Pulse Rate 04/25/22 1654 (!) 102     Resp 04/25/22 1654 17     Temp 04/25/22 1654 98.4 F (36.9 C)      Temp Source 04/25/22 1654 Oral     SpO2 04/25/22 1654 96 %     Weight --      Height --      Head Circumference --      Peak Flow --      Pain Score 04/25/22 1655 7     Pain  Loc --      Pain Edu? --      Excl. in [place]? --    No data found.  Updated Vital Signs BP (!) 147/100 (BP Location: Left Arm)   Pulse (!) 102   Temp 98.4 F (36.9 C) (Oral)   Resp 17   LMP 08/18/2021 (Exact Date)   SpO2 96%   Visual Acuity Right Eye Distance:   Left Eye Distance:   Bilateral Distance:    Right Eye Near:   Left Eye Near:    Bilateral Near:     Physical Exam Vitals reviewed.  Constitutional:      General: Brianna Gutierrez is not in acute distress.    Appearance: Brianna Gutierrez is not toxic-appearing.  HENT:     Nose: Nose normal.     Mouth/Throat:     Mouth: Mucous membranes are moist.     Pharynx: No oropharyngeal exudate or posterior oropharyngeal erythema.  Eyes:     Extraocular Movements: Extraocular movements intact.     Conjunctiva/sclera: Conjunctivae normal.     Pupils: Pupils are equal, round, and reactive to light.  Cardiovascular:     Rate and Rhythm: Regular rhythm. Tachycardia present.     Heart sounds: No murmur heard. Pulmonary:     Effort: Pulmonary effort is normal. No respiratory distress.     Breath sounds: No stridor. No wheezing, rhonchi or rales.  Musculoskeletal:     Cervical back: Neck supple.  Lymphadenopathy:     Cervical: No cervical adenopathy.  Skin:    Capillary Refill: Capillary refill takes less than 2 seconds.     Coloration: Skin is not jaundiced or pale.  Neurological:     General: No focal deficit present.     Mental Status: Brianna Gutierrez is alert and oriented to person, place, and time.  Psychiatric:        Behavior: Behavior normal.      UC Treatments / Results  Labs (all labs ordered are listed, but only abnormal results are displayed) Labs Reviewed - No data to display  EKG   Radiology No results found.  Procedures Procedures (including critical  care time)  Medications Ordered in UC Medications - No data to display  Initial Impression / Assessment and Plan / UC Course  I have reviewed the triage vital signs and the nursing notes.  Pertinent labs & imaging results that were available during my care of the patient were reviewed by me and considered in my medical decision making (see chart for details).    Brianna Gutierrez has some mild tachycardia here and her blood pressure is elevated at 147/100. Her lungs are clear, and I am concerned that Brianna Gutierrez needs to be seen in the emergency room for higher level of care and evaluation.  We will have her transported by ambulance; Brianna Gutierrez drove herself here. Final Clinical Impressions(s) / UC Diagnoses   Final diagnoses:  Dyspnea, unspecified type     Discharge Instructions      Patient will be transported to the emergency room for higher level of care     ED Prescriptions   None    PDMP not reviewed this encounter.   Barrett Henle, MD 04/25/22 1710    Barrett Henle, MD 04/25/22 [phone]

## 2022-04-25 NOTE — ED Notes (Signed)
Patient is being discharged from the Urgent Care and sent to the Emergency Department via [facility] . Per Dr Windy Carina, patient is in need of higher level of care due to hypertension & sudden onset of SOB. Patient is aware and verbalizes understanding of plan of care.   Vitals:   04/25/22 1654  BP: (!) 147/100  Pulse: (!) 102  Resp: 17  Temp: 98.4 F (36.9 C)  SpO2: 96%

## 2022-04-25 NOTE — ED Triage Notes (Signed)
Pt presents with shortness of breath and elevated blood pressure since waking up this morning.  Pt has Hx of bronchitis.

## 2022-04-25 NOTE — ED Triage Notes (Signed)
Pt. BIB [facility], Pt coming from Urgent Care. Via EMS described Pt. C/o a sudden SOB that has been on-going since 12:00pm, headache, and HTN. Urgent care provider concerned of possible PE.  Pt. Has HX of PE from previous Hysterectomy in Nov.

## 2022-04-25 NOTE — ED Triage Notes (Signed)
Pt also complains of intermittent headache and dizziness with elevated blood pressure.

## 2022-04-25 NOTE — ED Provider Notes (Signed)
[facility] DEPT Provider Note   CSN: [id number] Arrival date & time: 04/25/22  1805     History  Chief Complaint  Patient presents with   Shortness of Breath   Possible PE     Brianna Gutierrez is a 46 y.o. female.  Patient as above with significant medical history as below, including anxiety, depression, hypertension, prediabetes who presents to the ED with complaint of chest pain, dyspnea, headache.  Patient reports since this morning she has been having headache similar to prior migraines.  She took Excedrin Migraine x2 with minimal improvement to her symptoms.  She went to urgent care as she began to develop midsternal chest pain, mild dyspnea.  Chest pain described as pleuritic.  Does not experience this type of pain in the past.  No nausea or vomiting, no diaphoresis numbness or tingling.  Pain has improved since the onset.  Pain pressure, tightness midsternum.  Pain does not radiate.     Past Medical History:  Diagnosis Date   Abnormal Pap smear of cervix    Anxiety    Depression    Dysmenorrhea    Eczema    History of COVID-19 10/24/2020   loss of taste and smellbody aches and cough x 4 days all symptoms resolved   History of kidney stones    Hypertension    Pre-diabetes    Rash 08/31/2021   comes and goes on legs saw dermetology no rash now ? food allergy   SUI (stress urinary incontinence, female)    Trichomonas infection 02/2021   Uterine fibroid     Past Surgical History:  Procedure Laterality Date   BLADDER SUSPENSION N/A 09/11/2021   Procedure: TRANSVAGINAL TAPE (TVT) PROCEDURE;  Surgeon: [name], MD;  Location: [facility];  Service: Gynecology;  Laterality: N/A;   CESAREAN SECTION  1997   x 1   CHOLECYSTECTOMY N/A 01/14/2019   Procedure: LAPAROSCOPIC CHOLECYSTECTOMY WITH POSSIBLE  INTRAOPERATIVE CHOLANGIOGRAM;  Surgeon: Armandina Gemma, MD;  Location: [facility];  Service: General;   Laterality: N/A;   CYSTOSCOPY N/A 09/11/2021   Procedure: Consuela Mimes;  Surgeon: [name], MD;  Location: [facility];  Service: Gynecology;  Laterality: N/A;   CYSTOSCOPY/URETEROSCOPY/HOLMIUM LASER Left 01/31/2022   Procedure: LEFT URETEROSCOPY/HOLMIUM LASER;  Surgeon: Vira Agar, MD;  Location: [facility];  Service: Urology;  Laterality: Left;   DILITATION & CURRETTAGE/HYSTROSCOPY WITH HYDROTHERMAL ABLATION N/A 06/22/2016   Procedure: DILATATION & CURETTAGE/HYSTEROSCOPY WITH HYDROTHERMAL ABLATION;  Surgeon: Shelly Bombard, MD;  Location: [facility];  Service: Gynecology;  Laterality: N/A;   TOTAL LAPAROSCOPIC HYSTERECTOMY WITH SALPINGECTOMY Bilateral 09/11/2021   Procedure: TOTAL LAPAROSCOPIC HYSTERECTOMY WITH SALPINGECTOMY;  Surgeon: [name], MD;  Location: [facility];  Service: Gynecology;  Laterality: Bilateral;   TUBAL LIGATION     09-28-2015     The history is provided by the patient. No language interpreter was used.  Shortness of Breath Associated symptoms: chest pain and headaches   Associated symptoms: no abdominal pain, no cough, no fever, no rash and no vomiting        Home Medications Prior to Admission medications   Medication Sig Start Date End Date Taking? Authorizing Provider  albuterol (VENTOLIN HFA) 108 (90 Base) MCG/ACT inhaler Inhale 2 puffs into the lungs every 6 (six) hours as needed for wheezing or shortness of breath. 02/14/22  Yes Cobb, Karie Schwalbe, NP  amLODipine (NORVASC) 10 MG  tablet Take 1 tablet (10 mg total) by mouth every evening. 04/24/22  Yes [name], NP  butalbital-acetaminophen-caffeine (FIORICET) [phone] MG tablet Take 1-2 tablets by mouth every 6 (six) hours as needed for headache. 04/25/22 04/25/23 Yes Wynona Dove A, DO  EPINEPHrine (EPIPEN 2-PAK) 0.3 mg/0.3 mL IJ SOAJ injection Inject 0.3 mg into the muscle as needed for anaphylaxis. Patient taking differently: Inject 0.3 mg  into the muscle once as needed for anaphylaxis. 03/15/22  Yes Minette Brine, Amy J, NP  famotidine (PEPCID) 20 MG tablet Take 1 tablet (20 mg total) by mouth 2 (two) times daily. 04/18/22  Yes Garnet Sierras, DO  hydrOXYzine (VISTARIL) 25 MG capsule Take 1 capsule (25 mg total) by mouth every 8 (eight) hours as needed. Patient taking differently: Take 25 mg by mouth every 8 (eight) hours as needed for itching or anxiety. 02/13/22  Yes Minette Brine, Amy J, NP  metFORMIN (GLUCOPHAGE) 500 MG tablet Take 1 tablet (500 mg total) by mouth 2 (two) times daily with a meal. 09/29/21 01/20/24 Yes Hall, Carole N, DO  nicotine (NICODERM CQ - DOSED IN MG/24 HOURS) 14 mg/24hr patch Place 1 patch (14 mg total) onto the skin daily. 12/19/21  Yes Minette Brine, Amy J, NP  sertraline (ZOLOFT) 100 MG tablet Take 1 tablet (100 mg total) by mouth at bedtime. 02/13/22 06/13/22 Yes Minette Brine, Amy J, NP  dicyclomine (BENTYL) 20 MG tablet Take 1 tablet (20 mg total) by mouth 2 (two) times daily. 10/29/19 08/24/20  Ok Edwards, PA-C  omeprazole (PRILOSEC) 20 MG capsule Take 1 capsule (20 mg total) by mouth daily. 01/01/19 08/07/19  Scot Jun, FNP      Allergies    Tramadol    Review of Systems   Review of Systems  Constitutional:  Negative for chills and fever.  HENT:  Negative for facial swelling and trouble swallowing.   Eyes:  Negative for photophobia and visual disturbance.  Respiratory:  Positive for shortness of breath. Negative for cough.   Cardiovascular:  Positive for chest pain. Negative for palpitations.  Gastrointestinal:  Negative for abdominal pain, nausea and vomiting.  Endocrine: Negative for polydipsia and polyuria.  Genitourinary:  Negative for difficulty urinating and hematuria.  Musculoskeletal:  Negative for gait problem and joint swelling.  Skin:  Negative for pallor and rash.  Neurological:  Positive for headaches. Negative for syncope.  Psychiatric/Behavioral:  Negative for agitation and confusion.      Physical Exam Updated Vital Signs BP (!) 176/110   Pulse 96   Temp 97.9 F (36.6 C)   Resp 19   Ht '5\' 2"'$  (1.575 m)   Wt 83.5 kg   LMP 08/18/2021 (Exact Date)   SpO2 97%   BMI 33.65 kg/m  Physical Exam Vitals and nursing note reviewed.  Constitutional:      General: She is not in acute distress.    Appearance: Normal appearance.  HENT:     Head: Normocephalic and atraumatic.     Right Ear: External ear normal.     Left Ear: External ear normal.     Nose: Nose normal.     Mouth/Throat:     Mouth: Mucous membranes are moist.  Eyes:     General: No scleral icterus.       Right eye: No discharge.        Left eye: No discharge.     Extraocular Movements: Extraocular movements intact.     Pupils: Pupils are equal, round, and reactive to light.  Cardiovascular:     Rate and Rhythm: Normal rate and regular rhythm.     Pulses: Normal pulses.     Heart sounds: Normal heart sounds.     No S3 or S4 sounds.  Pulmonary:     Effort: Pulmonary effort is normal. No respiratory distress.     Breath sounds: Normal breath sounds.  Abdominal:     General: Abdomen is flat.     Tenderness: There is no abdominal tenderness.  Musculoskeletal:        General: Normal range of motion.     Cervical back: Full passive range of motion without pain and normal range of motion.     Right lower leg: No edema.     Left lower leg: No edema.  Skin:    General: Skin is warm and dry.     Capillary Refill: Capillary refill takes less than 2 seconds.  Neurological:     Mental Status: She is alert and oriented to person, place, and time.     GCS: GCS eye subscore is 4. GCS verbal subscore is 5. GCS motor subscore is 6.     Cranial Nerves: Cranial nerves 2-12 are intact. No dysarthria or facial asymmetry.     Sensory: Sensation is intact.     Motor: Motor function is intact. No tremor.     Coordination: Coordination is intact.     Gait: Gait is intact.  Psychiatric:        Mood and Affect: Mood  normal.        Behavior: Behavior normal.     ED Results / Procedures / Treatments   Labs (all labs ordered are listed, but only abnormal results are displayed) Labs Reviewed  BASIC METABOLIC PANEL - Abnormal; Notable for the following components:      Result Value   Potassium 3.4 (*)    Glucose, Bld 101 (*)    All other components within normal limits  CBC WITH DIFFERENTIAL/PLATELET - Abnormal; Notable for the following components:   RBC 6.46 (*)    MCV 71.2 (*)    MCH 22.3 (*)    RDW 18.3 (*)    All other components within normal limits  RESP PANEL BY RT-PCR (FLU A&B, COVID) ARPGX2  BRAIN NATRIURETIC PEPTIDE  D-DIMER, QUANTITATIVE  MAGNESIUM  HCG, QUANTITATIVE, PREGNANCY  I-STAT BETA HCG BLOOD, ED ([facility], [facility], [facility] ONLY)  TROPONIN I (HIGH SENSITIVITY)  TROPONIN I (HIGH SENSITIVITY)    EKG EKG Interpretation  Date/Time:  Wednesday April 25 2022 18:49:06 EDT Ventricular Rate:  89 PR Interval:  155 QRS Duration: 81 QT Interval:  358 QTC Calculation: 436 R Axis:   32 Text Interpretation: Sinus rhythm Biatrial enlargement Nonspecific T abnormalities, anterior leads Confirmed by Wynona Dove ([id number]) on 04/25/2022 11:25:00 PM  Radiology DG Chest Port 1 View  Result Date: 04/25/2022 CLINICAL DATA:  Shortness of breath EXAM: PORTABLE CHEST 1 VIEW COMPARISON:  10/10/2021 FINDINGS: Minimal atelectasis or scarring in the lower lungs. No acute consolidation, pleural effusion or pneumothorax. Stable cardiomediastinal silhouette. IMPRESSION: No active disease. Streaky atelectasis or minor scarring at the bases Electronically Signed   By: Donavan Foil M.D.   On: 04/25/2022 19:03    Procedures Procedures    Medications Ordered in ED Medications  lidocaine (LIDODERM) 5 % 1 patch (1 patch Transdermal Patch Applied 04/25/22 1926)  nicotine (NICODERM CQ - dosed in mg/24 hr) patch 7 mg (7 mg Transdermal Patch Applied 04/25/22 2010)  ipratropium-albuterol (DUONEB) 0.5-2.5 (3) MG/3ML nebulizer  solution 3 mL (3 mLs Nebulization Given 04/25/22 1853)  sodium chloride 0.9 % bolus 1,000 mL (0 mLs Intravenous Stopped 04/25/22 2111)  metoCLOPramide (REGLAN) injection 5 mg (5 mg Intravenous Given 04/25/22 1922)  diphenhydrAMINE (BENADRYL) injection 25 mg (25 mg Intravenous Given 04/25/22 1922)  magnesium sulfate IVPB 2 g 50 mL (0 g Intravenous Stopped 04/25/22 1951)  hydrOXYzine (ATARAX) tablet 25 mg (25 mg Oral Given 04/25/22 2010)  HYDROmorphone (DILAUDID) injection 1 mg (1 mg Intravenous Given 04/25/22 2317)  ondansetron (ZOFRAN) injection 4 mg (4 mg Intravenous Given 04/25/22 2316)  amLODipine (NORVASC) tablet 10 mg (10 mg Oral Given 04/25/22 2315)    ED Course/ Medical Decision Making/ A&P                           Medical Decision Making Amount and/or Complexity of Data Reviewed Labs: ordered. Radiology: ordered.  Risk OTC drugs. Prescription drug management.    CC: ha, cp,    This patient presents to the Emergency Department for the above complaint. This involves an extensive number of treatment options and is a complaint that carries with it a high risk of complications and morbidity. Vital signs were reviewed. Serious etiologies considered.  Differential diagnosis includes but is not exclusive to subarachnoid hemorrhage, meningitis, encephalitis, previous head trauma, cavernous venous thrombosis, muscle tension headache, glaucoma, temporal arteritis, migraine or migraine equivalent, etc.  Differential includes all life-threatening causes for chest pain. This includes but is not exclusive to acute coronary syndrome, aortic dissection, pulmonary embolism, cardiac tamponade, community-acquired pneumonia, pericarditis, musculoskeletal chest wall pain, etc.  Record review:  Previous records obtained and reviewed recent urgent care note, prior ED visits, prior labs and imaging  Additional history obtained from [facility] and surgical history as noted above.   Work up as above,  notable for:  Labs & imaging results that were available during my care of the patient were visualized by me and considered in my medical decision making.   I ordered imaging studies which included chest x-ray. I visualized the imaging, interpreted images, and I agree with radiologist interpretation.  No acute process, no large pneumothorax or pneumonia  Cardiac monitoring reviewed and interpreted personally which shows NSR  Low risk Wells score, D-dimer is not elevated.  Suspicion for PE is very low. She is HDS.   Labs reviewed, these are stable.  Management: Migraine cocktail, DuoNeb, home meds, ivf  ED Course:     Reassessment:  Symptoms improved greatly.  Neurologically she is intact.  She is ambulatory with steady gait.  No numbness or tingling.  Is able tolerate p.o. intake.  No longer having chest discomfort.  Breathing comfortably on ambient air.  Stable for discharge.  Admission was considered.    The patient's chest pain is not suggestive of pulmonary embolus, cardiac ischemia, aortic dissection, pericarditis, myocarditis, pulmonary embolism, pneumothorax, pneumonia, Zoster, or esophageal perforation, or other serious etiology.  Historically not abrupt in onset, tearing or ripping, pulses symmetric. EKG nonspecific for ischemia/infarction. No dysrhythmias, brugada, WPW, prolonged QT noted.   Troponin negative x2. CXR reviewed. Labs without demonstration of acute pathology unless otherwise noted above. Low HEART Score: 0-3 points (0.9-1.7% risk of MACE).  Given the extremely low risk of these diagnoses further testing and evaluation for these possibilities does not appear to be indicated at this time.   Patient presents with headache. Based on the patient's history and physical there is very low clinical suspicion for significant  intracranial pathology. The headache was not sudden onset, not maximal at onset, there are no neurologic findings on exam, the patient does not have a  fever, the patient does not have any jaw claudication, the patient does not endorse a clotting disorder, patient denies any trauma or eye pain and the headache is not associated with dizziness, weakness on one side of the body, diplopia, vertigo, slurred speech, or ataxia. Given the extremely low risk of these diagnoses further testing and evaluation for these possibilities does not appear to be indicated at this time.   Patient in no distress and overall condition improved here in the ED. Detailed discussions were had with the patient regarding current findings, and need for close f/u with PCP or on call doctor. The patient has been instructed to return immediately if the symptoms worsen in any way for re-evaluation. Patient verbalized understanding and is in agreement with current care plan. All questions answered prior to discharge.            Social determinants of health include -   Social History   Socioeconomic History   Marital status: Married    Spouse name: Not on file   Number of children: Not on file   Years of education: Not on file   Highest education level: Not on file  Occupational History   Not on file  Tobacco Use   Smoking status: Former    Packs/day: 0.10    Years: 20.00    Total pack years: 2.00    Types: Cigarettes    Quit date: 10/01/2021    Years since quitting: 0.5    Passive exposure: Never   Smokeless tobacco: Never  Vaping Use   Vaping Use: Former  Substance and Sexual Activity   Alcohol use: Not Currently   Drug use: Never   Sexual activity: Not Currently    Partners: Male    Birth control/protection: Surgical    Comment: [place] 09/11/21  Other Topics Concern   Not on file  Social History Narrative   Not on file   Social Determinants of Health   Financial Resource Strain: Not on file  Food Insecurity: Not on file  Transportation Needs: Not on file  Physical Activity: Not on file  Stress: Not on file  Social Connections: Not on file   Intimate Partner Violence: Not on file      This chart was dictated using voice recognition software.  Despite best efforts to proofread,  errors can occur which can change the documentation meaning.         Final Clinical Impression(s) / ED Diagnoses Final diagnoses:  Nonintractable headache, unspecified chronicity pattern, unspecified headache type  Atypical chest pain    Rx / DC Orders ED Discharge Orders          Ordered    butalbital-acetaminophen-caffeine (FIORICET) 50-325-40 MG tablet  Every 6 hours PRN        04/25/22 2318              Jeanell Sparrow, DO 04/25/22 2328

## 2022-04-25 NOTE — Discharge Instructions (Addendum)

## 2022-04-26 ENCOUNTER — Other Ambulatory Visit: Payer: Self-pay

## 2022-04-26 LAB — C-REACTIVE PROTEIN: CRP: 12 mg/L — ABNORMAL HIGH (ref 0–10)

## 2022-04-26 LAB — TRYPTASE: Tryptase: 5.3 ug/L (ref 2.2–13.2)

## 2022-04-26 LAB — C3 AND C4
Complement C3, Serum: 178 mg/dL — ABNORMAL HIGH (ref 82–167)
Complement C4, Serum: 45 mg/dL — ABNORMAL HIGH (ref 12–38)

## 2022-04-28 ENCOUNTER — Ambulatory Visit: Payer: BC Managed Care – PPO | Admitting: Podiatry

## 2022-04-30 ENCOUNTER — Encounter: Payer: Self-pay | Admitting: Physician Assistant

## 2022-04-30 ENCOUNTER — Ambulatory Visit (INDEPENDENT_AMBULATORY_CARE_PROVIDER_SITE_OTHER): Payer: BC Managed Care – PPO | Admitting: Physician Assistant

## 2022-04-30 ENCOUNTER — Ambulatory Visit: Payer: Self-pay | Admitting: *Deleted

## 2022-04-30 VITALS — BP 131/90 | HR 97 | Resp 18 | Ht 62.0 in | Wt 197.0 lb

## 2022-04-30 DIAGNOSIS — I10 Essential (primary) hypertension: Secondary | ICD-10-CM

## 2022-04-30 MED ORDER — HYDROCHLOROTHIAZIDE 12.5 MG PO TABS: 12.5000 mg | ORAL_TABLET | Freq: Every day | ORAL | 1 refills | Status: DC

## 2022-04-30 NOTE — Progress Notes (Signed)
Established Patient Office Visit  Subjective   Patient ID: Brianna Gutierrez, female    DOB: 1976-04-18  Age: 46 y.o. MRN: [id number]  Chief Complaint  Patient presents with   Hypertension    States that she has been having elevated BP readings at home, running approx 140/100.  States that she has been compliant to her amlodipine.  States that she has been having a slight headache and feels like it might be related to her elevated BP readings.  States that she did present to UC last week with a migraine and had an elevated BP reading that prompted evaluation at the ED.  Has previously failed lisinopril.    Past Medical History:  Diagnosis Date   Abnormal Pap smear of cervix    Anxiety    Depression    Dysmenorrhea    Eczema    History of COVID-19 10/24/2020   loss of taste and smellbody aches and cough x 4 days all symptoms resolved   History of kidney stones    Hypertension    Pre-diabetes    Rash 08/31/2021   comes and goes on legs saw dermetology no rash now ? food allergy   SUI (stress urinary incontinence, female)    Trichomonas infection 02/2021   Uterine fibroid    Social History   Socioeconomic History   Marital status: Married    Spouse name: Not on file   Number of children: Not on file   Years of education: Not on file   Highest education level: Not on file  Occupational History   Not on file  Tobacco Use   Smoking status: Former    Packs/day: 0.10    Years: 20.00    Total pack years: 2.00    Types: Cigarettes    Quit date: 10/01/2021    Years since quitting: 0.5    Passive exposure: Never   Smokeless tobacco: Never  Vaping Use   Vaping Use: Former  Substance and Sexual Activity   Alcohol use: Not Currently   Drug use: Never   Sexual activity: Not Currently    Partners: Male    Birth control/protection: Surgical    Comment: [place] 09/11/21  Other Topics Concern   Not on file  Social History Narrative   Not on file   Social Determinants  of Health   Financial Resource Strain: Not on file  Food Insecurity: Not on file  Transportation Needs: Not on file  Physical Activity: Not on file  Stress: Not on file  Social Connections: Not on file  Intimate Partner Violence: Not on file   Family History  Problem Relation Age of Onset   Hypertension Maternal Grandmother    Diabetes Maternal Grandfather    Allergic rhinitis Daughter    Allergic rhinitis Daughter    Allergic rhinitis Son    Allergic rhinitis Son    Colon cancer Neg Hx    Esophageal cancer Neg Hx    Rectal cancer Neg Hx    Stomach cancer Neg Hx    Allergies  Allergen Reactions   Tramadol Other (See Comments)    Headache    Review of Systems  Constitutional: Negative.   HENT: Negative.    Eyes: Negative.   Respiratory:  Negative for shortness of breath.   Cardiovascular:  Negative for chest pain.  Gastrointestinal: Negative.   Genitourinary: Negative.   Musculoskeletal: Negative.   Skin: Negative.   Neurological:  Positive for headaches. Negative for dizziness, focal weakness and seizures.  Endo/Heme/Allergies: Negative.       Objective:     BP 131/90 (BP Location: Right Arm, Patient Position: Sitting, Cuff Size: Large)   Pulse 97   Resp 18   Ht '5\' 2"'$  (1.575 m)   Wt 197 lb (89.4 kg)   LMP 08/18/2021 (Exact Date)   SpO2 96%   BMI 36.03 kg/m  BP Readings from Last 3 Encounters:  04/30/22 131/90  04/26/22 (!) 164/93  04/25/22 (!) 147/100      Physical Exam Vitals and nursing note reviewed.  Constitutional:      Appearance: Normal appearance.  HENT:     Head: Normocephalic and atraumatic.     Right Ear: External ear normal.     Left Ear: External ear normal.     Nose: Nose normal.     Mouth/Throat:     Mouth: Mucous membranes are moist.     Pharynx: Oropharynx is clear.  Eyes:     Extraocular Movements: Extraocular movements intact.     Conjunctiva/sclera: Conjunctivae normal.     Pupils: Pupils are equal, round, and reactive  to light.  Cardiovascular:     Rate and Rhythm: Normal rate and regular rhythm.     Pulses: Normal pulses.     Heart sounds: Normal heart sounds.  Pulmonary:     Effort: Pulmonary effort is normal.     Breath sounds: Normal breath sounds.  Musculoskeletal:        General: Normal range of motion.     Cervical back: Normal range of motion and neck supple.  Skin:    General: Skin is warm and dry.  Neurological:     General: No focal deficit present.     Mental Status: She is alert.  Psychiatric:        Mood and Affect: Mood normal.        Behavior: Behavior normal.        Thought Content: Thought content normal.        Judgment: Judgment normal.        Assessment & Plan:   Problem List Items Addressed This Visit       Cardiovascular and Mediastinum   Essential hypertension - Primary   Relevant Medications   hydrochlorothiazide (HYDRODIURIL) 12.5 MG tablet   1. Essential hypertension Trial HCTZ, patient encouraged to check BP at home, keep written log and bring to all office visits.  Patient education given on low sodium diet.  Appt made to f/u with PCP.  Red flags given for prompt reevaluation - hydrochlorothiazide (HYDRODIURIL) 12.5 MG tablet; Take 1 tablet (12.5 mg total) by mouth daily.  Dispense: 30 tablet; Refill: 1   I have reviewed the patient's medical history (PMH, PSH, Social History, Family History, Medications, and allergies) , and have been updated if relevant. I spent 30 minutes reviewing chart and  face to face time with patient.    Return in about 4 weeks (around 05/28/2022) for with Durene Fruits, NP at [place] [facility].    Loraine Grip Mayers, PA-C

## 2022-04-30 NOTE — Telephone Encounter (Signed)
  Chief Complaint: htn Symptoms: slight headache starting Frequency: this morning Pertinent Negatives: Patient denies out of meds, states took HTN med last night Disposition: '[]'$ ED /'[]'$ Urgent Care (no appt availability in office) / '[x]'$ Appointment(In office/virtual)/ '[]'$  [facility] [facility]/ '[]'$ Home Care/ '[]'$ Refused Recommended Disposition /'[]'$ [facility]/ '[]'$  Follow-up with PCP Additional Notes: Pt had a telehealth nurse fro her insurance company call today and that nurse called for pt. Pt pressure 140/104. Was hospitalized with HTN recently and is trying to get it taken care of early. Appt for this morning at 10am.  Reason for Disposition  Systolic BP  >= 250 OR Diastolic >= 037  Answer Assessment - Initial Assessment Questions 1. BLOOD PRESSURE: "What is the blood pressure?" "Did you take at least two measurements 5 minutes apart?"     Telephonic insurance nurse checked on her today and her bp was high 2. ONSET: "When did you take your blood pressure?"     140/104 3. HOW: "How did you obtain the blood pressure?" (e.g., visiting nurse, automatic home BP monitor)     Home monitor 4. HISTORY: "Do you have a history of high blood pressure?"     Yes, hospitalized 5. MEDICATIONS: "Are you taking any medications for blood pressure?" "Have you missed any doses recently?"     Amlodipine at HS 6. OTHER SYMPTOMS: "Do you have any symptoms?" (e.g., headache, chest pain, blurred vision, difficulty breathing, weakness)     Slight headache, trying to catch it before gets out of control. Took headache med  Protocols used: Blood Pressure - High-A-AH

## 2022-04-30 NOTE — Patient Instructions (Signed)
You are going to start taking 12.5 mg of hydrochlorothiazide in addition to your amlodipine.  You will take this medication in the morning time.  I encourage you to continue checking your blood pressure at home, keep a written log and have available for all office visits.  I encourage you to make sure that you are staying very well-hydrated, you should be drinking at least 64 ounces of water a day.  I encourage you to follow a low-sodium diet as well.  Kennieth Rad, PA-C Physician Assistant [facility] [email or web address]   Low-Sodium Eating Plan Sodium, which is an element that makes up salt, helps you maintain a healthy balance of fluids in your body. Too much sodium can increase your blood pressure and cause fluid and waste to be held in your body. Your health care provider or dietitian may recommend following this plan if you have high blood pressure (hypertension), kidney disease, liver disease, or heart failure. Eating less sodium can help lower your blood pressure, reduce swelling, and protect your heart, liver, and kidneys. What are tips for following this plan? Reading food labels The Nutrition Facts label lists the amount of sodium in one serving of the food. If you eat more than one serving, you must multiply the listed amount of sodium by the number of servings. Choose foods with less than 140 mg of sodium per serving. Avoid foods with 300 mg of sodium or more per serving. Shopping  Look for lower-sodium products, often labeled as "low-sodium" or "no salt added." Always check the sodium content, even if foods are labeled as "unsalted" or "no salt added." Buy fresh foods. Avoid canned foods and pre-made or frozen meals. Avoid canned, cured, or processed meats. Buy breads that have less than 80 mg of sodium per slice. Cooking  Eat more home-cooked food and less restaurant, buffet, and fast food. Avoid adding salt when  cooking. Use salt-free seasonings or herbs instead of table salt or sea salt. Check with your health care provider or pharmacist before using salt substitutes. Cook with plant-based oils, such as canola, sunflower, or olive oil. Meal planning When eating at a restaurant, ask that your food be prepared with less salt or no salt, if possible. Avoid dishes labeled as brined, pickled, cured, smoked, or made with soy sauce, miso, or teriyaki sauce. Avoid foods that contain MSG (monosodium glutamate). MSG is sometimes added to [place] food, bouillon, and some canned foods. Make meals that can be grilled, baked, poached, roasted, or steamed. These are generally made with less sodium. General information Most people on this plan should limit their sodium intake to 1,500-2,000 mg (milligrams) of sodium each day. What foods should I eat? Fruits Fresh, frozen, or canned fruit. Fruit juice. Vegetables Fresh or frozen vegetables. "No salt added" canned vegetables. "No salt added" tomato sauce and paste. Low-sodium or reduced-sodium tomato and vegetable juice. Grains Low-sodium cereals, including oats, puffed wheat and rice, and shredded wheat. Low-sodium crackers. Unsalted rice. Unsalted pasta. Low-sodium bread. Whole-grain breads and whole-grain pasta. Meats and other proteins Fresh or frozen (no salt added) meat, poultry, seafood, and fish. Low-sodium canned tuna and salmon. Unsalted nuts. Dried peas, beans, and lentils without added salt. Unsalted canned beans. Eggs. Unsalted nut butters. Dairy Milk. Soy milk. Cheese that is naturally low in sodium, such as ricotta cheese, fresh mozzarella, or Swiss cheese. Low-sodium or reduced-sodium cheese. Cream cheese. Yogurt. Seasonings and condiments Fresh and dried herbs and spices. Salt-free seasonings. Low-sodium mustard and ketchup. Sodium-free  salad dressing. Sodium-free light mayonnaise. Fresh or refrigerated horseradish. Lemon juice. Vinegar. Other  foods Homemade, reduced-sodium, or low-sodium soups. Unsalted popcorn and pretzels. Low-salt or salt-free chips. The items listed above may not be a complete list of foods and beverages you can eat. Contact a dietitian for more information. What foods should I avoid? Vegetables Sauerkraut, pickled vegetables, and relishes. Olives. [place] fries. Onion rings. Regular canned vegetables (not low-sodium or reduced-sodium). Regular canned tomato sauce and paste (not low-sodium or reduced-sodium). Regular tomato and vegetable juice (not low-sodium or reduced-sodium). Frozen vegetables in sauces. Grains Instant hot cereals. Bread stuffing, pancake, and biscuit mixes. Croutons. Seasoned rice or pasta mixes. Noodle soup cups. Boxed or frozen macaroni and cheese. Regular salted crackers. Self-rising flour. Meats and other proteins Meat or fish that is salted, canned, smoked, spiced, or pickled. Precooked or cured meat, such as sausages or meat loaves. Berniece Salines. Ham. Pepperoni. Hot dogs. Corned beef. Chipped beef. Salt pork. Jerky. Pickled herring. Anchovies and sardines. Regular canned tuna. Salted nuts. Dairy Processed cheese and cheese spreads. Hard cheeses. Cheese curds. Blue cheese. Feta cheese. String cheese. Regular cottage cheese. Buttermilk. Canned milk. Fats and oils Salted butter. Regular margarine. Ghee. Bacon fat. Seasonings and condiments Onion salt, garlic salt, seasoned salt, table salt, and sea salt. Canned and packaged gravies. Worcestershire sauce. Tartar sauce. Barbecue sauce. Teriyaki sauce. Soy sauce, including reduced-sodium. Steak sauce. Fish sauce. Oyster sauce. Cocktail sauce. Horseradish that you find on the shelf. Regular ketchup and mustard. Meat flavorings and tenderizers. Bouillon cubes. Hot sauce. Pre-made or packaged marinades. Pre-made or packaged taco seasonings. Relishes. Regular salad dressings. Salsa. Other foods Salted popcorn and pretzels. Corn chips and puffs. Potato and  tortilla chips. Canned or dried soups. Pizza. Frozen entrees and pot pies. The items listed above may not be a complete list of foods and beverages you should avoid. Contact a dietitian for more information. Summary Eating less sodium can help lower your blood pressure, reduce swelling, and protect your heart, liver, and kidneys. Most people on this plan should limit their sodium intake to 1,500-2,000 mg (milligrams) of sodium each day. Canned, boxed, and frozen foods are high in sodium. Restaurant foods, fast foods, and pizza are also very high in sodium. You also get sodium by adding salt to food. Try to cook at home, eat more fresh fruits and vegetables, and eat less fast food and canned, processed, or prepared foods. This information is not intended to replace advice given to you by your health care provider. Make sure you discuss any questions you have with your health care provider. Document Revised: 11/20/2019 Document Reviewed: 09/16/2019 Elsevier Patient Education  [place].

## 2022-05-03 DIAGNOSIS — T781XXD Other adverse food reactions, not elsewhere classified, subsequent encounter: Secondary | ICD-10-CM | POA: Diagnosis not present

## 2022-05-03 DIAGNOSIS — J3089 Other allergic rhinitis: Secondary | ICD-10-CM | POA: Diagnosis not present

## 2022-05-03 DIAGNOSIS — L509 Urticaria, unspecified: Secondary | ICD-10-CM | POA: Diagnosis not present

## 2022-05-11 ENCOUNTER — Ambulatory Visit: Payer: Self-pay

## 2022-05-11 LAB — COMPREHENSIVE METABOLIC PANEL
ALT: 17 IU/L (ref 0–32)
AST: 12 IU/L (ref 0–40)
Albumin/Globulin Ratio: 1.2 (ref 1.2–2.2)
Albumin: 4.2 g/dL (ref 3.8–4.8)
Alkaline Phosphatase: 142 IU/L — ABNORMAL HIGH (ref 44–121)
BUN/Creatinine Ratio: 16 (ref 9–23)
BUN: 11 mg/dL (ref 6–24)
Bilirubin Total: <0.2 mg/dL (ref 0.0–1.2)
CO2: 23 mmol/L (ref 20–29)
Calcium: 9.4 mg/dL (ref 8.7–10.2)
Chloride: 103 mmol/L (ref 96–106)
Creatinine, Ser: 0.7 mg/dL (ref 0.57–1.00)
Globulin, Total: 3.5 g/dL (ref 1.5–4.5)
Glucose: 84 mg/dL (ref 70–99)
Potassium: 3.7 mmol/L (ref 3.5–5.2)
Sodium: 140 mmol/L (ref 134–144)
Total Protein: 7.7 g/dL (ref 6.0–8.5)
eGFR: 109 mL/min/{1.73_m2} (ref >59)

## 2022-05-11 LAB — CBC WITH DIFFERENTIAL/PLATELET
Basophils Absolute: 0 10*3/uL (ref 0.0–0.2)
Basos: 0 %
EOS (ABSOLUTE): 0.2 10*3/uL (ref 0.0–0.4)
Eos: 4 %
Hematocrit: 45.3 % (ref 34.0–46.6)
Hemoglobin: 14 g/dL (ref 11.1–15.9)
Immature Grans (Abs): 0 10*3/uL (ref 0.0–0.1)
Immature Granulocytes: 0 %
Lymphocytes Absolute: 1.6 10*3/uL (ref 0.7–3.1)
Lymphs: 28 %
MCH: 21.7 pg — ABNORMAL LOW (ref 26.6–33.0)
MCHC: 30.9 g/dL — ABNORMAL LOW (ref 31.5–35.7)
MCV: 70 fL — ABNORMAL LOW (ref 79–97)
Monocytes Absolute: 0.5 10*3/uL (ref 0.1–0.9)
Monocytes: 9 %
Neutrophils Absolute: 3.3 10*3/uL (ref 1.4–7.0)
Neutrophils: 59 %
Platelets: 418 10*3/uL (ref 150–450)
RBC: 6.46 x10E6/uL — ABNORMAL HIGH (ref 3.77–5.28)
RDW: 17.5 % — ABNORMAL HIGH (ref 11.7–15.4)
WBC: 5.7 10*3/uL (ref 3.4–10.8)

## 2022-05-11 LAB — ALPHA-GAL PANEL
Allergen Lamb IgE: 0.32 kU/L — AB
Beef IgE: <0.1 kU/L
IgE (Immunoglobulin E), Serum: 329 IU/mL (ref 6–495)
O215-IgE Alpha-Gal: <0.1 kU/L
Pork IgE: 1.01 kU/L — AB

## 2022-05-11 LAB — ALLERGENS W/TOTAL IGE AREA 2
Alternaria Alternata IgE: 0.11 kU/L — AB
Aspergillus Fumigatus IgE: <0.1 kU/L
Bermuda Grass IgE: <0.1 kU/L
Cat Dander IgE: 0.64 kU/L — AB
Cedar, Mountain IgE: <0.1 kU/L
Cladosporium Herbarum IgE: <0.1 kU/L
Cockroach, German IgE: <0.1 kU/L
Common Silver Birch IgE: <0.1 kU/L
Cottonwood IgE: <0.1 kU/L
D Farinae IgE: <0.1 kU/L
D Pteronyssinus IgE: <0.1 kU/L
Dog Dander IgE: 12.6 kU/L — AB
Elm, American IgE: <0.1 kU/L
Johnson Grass IgE: <0.1 kU/L
Maple/Box Elder IgE: <0.1 kU/L
Mouse Urine IgE: 0.13 kU/L — AB
Oak, White IgE: <0.1 kU/L
Pecan, Hickory IgE: <0.1 kU/L
Penicillium Chrysogen IgE: <0.1 kU/L
Pigweed, Rough IgE: <0.1 kU/L
Ragweed, Short IgE: <0.1 kU/L
Sheep Sorrel IgE Qn: <0.1 kU/L
Timothy Grass IgE: <0.1 kU/L
White Mulberry IgE: <0.1 kU/L

## 2022-05-11 LAB — ANA W/REFLEX: Anti Nuclear Antibody (ANA): NEGATIVE

## 2022-05-11 LAB — THYROID CASCADE PROFILE: TSH: 1.09 u[IU]/mL (ref 0.450–4.500)

## 2022-05-11 LAB — ALLERGEN, STRAWBERRY, F44: Allergen Strawberry IgE: <0.1 kU/L

## 2022-05-11 LAB — ALLERGEN BANANA: Allergen Banana IgE: <0.1 kU/L

## 2022-05-11 LAB — SEDIMENTATION RATE: Sed Rate: 83 mm/hr — ABNORMAL HIGH (ref 0–32)

## 2022-05-11 LAB — CHRONIC URTICARIA: cu index: <1 (ref <10)

## 2022-05-11 NOTE — Telephone Encounter (Signed)
    [employer] case Freight forwarder and pt. Report. Chief Complaint: Pt. Seen in office 04/30/22 with new medication started. List of BP's - today 117/91, 7/13 - 126/94, 7/12 - 118/95, 7/11 - 126/101, 7/10 - 130/102, 7/9 - 141/103 Symptoms: No symptoms Frequency:  Pertinent Negatives: Patient denies any symptoms Disposition: '[]'$ ED /'[]'$ Urgent Care (no appt availability in office) / '[]'$ Appointment(In office/virtual)/ '[]'$  [facility]/ '[]'$ Home Care/ '[]'$ Refused Recommended Disposition /'[]'$ [facility]/ '[x]'$  Follow-up with PCP Additional Notes: Please advise pt.   Reason for Disposition  Systolic BP  >= 909 OR Diastolic >= 311  Answer Assessment - Initial Assessment Questions 1. BLOOD PRESSURE: "What is the blood pressure?" "Did you take at least two measurements 5 minutes apart?"     Today - 117/91 2. ONSET: "When did you take your blood pressure?"     Seen 04/30/22 3. HOW: "How did you obtain the blood pressure?" (e.g., visiting nurse, automatic home BP monitor)     Home cuff and [employer] nurse case manager 4. HISTORY: "Do you have a history of high blood pressure?"     Yes 5. MEDICATIONS: "Are you taking any medications for blood pressure?" "Have you missed any doses recently?"     Yes, no missed doses 6. OTHER SYMPTOMS: "Do you have any symptoms?" (e.g., headache, chest pain, blurred vision, difficulty breathing, weakness)     None 7. PREGNANCY: "Is there any chance you are pregnant?" "When was your last menstrual period?"     No  Protocols used: Blood Pressure - High-A-AH

## 2022-05-15 ENCOUNTER — Other Ambulatory Visit: Payer: Self-pay | Admitting: Nurse Practitioner

## 2022-05-15 DIAGNOSIS — D376 Neoplasm of uncertain behavior of liver, gallbladder and bile ducts: Secondary | ICD-10-CM | POA: Diagnosis not present

## 2022-05-15 NOTE — Progress Notes (Signed)
Called patient and reviewed results. Avoid pork and lamb for now - noted itching after eating pork in the past.  Okay to reintroduce strawberries and bananas at home. Doing better with zyrtec and Pepcid BID - continue until next visit. Will recheck sed rate and CBC diff at next visit - elevated ESR and RBC. Advised patient to hydrate. CMP - slightly elevated AP - will recheck this too.  Environmental panel positive to cat and dog - no pets at home but pets at grocery store a times. ANA, CU, TSH - normal. Keep August appointment.

## 2022-05-21 NOTE — Progress Notes (Signed)
Patient ID: Brianna Gutierrez, female    DOB: [birth year]  MRN: [id number]  CC: Hypertension Follow-Up  Subjective: Brianna Gutierrez is a 46 y.o. female who presents for hypertension follow-up.   Her concerns today include:  Last appointment 04/30/2022 with Carrolyn Meiers, PA. Continued on Amlodipine and Hydrochlorothiazide added at that time. Since then doing well on blood pressure medications without issues/concerns. Scheduled to see Psychiatry on 06/20/2022. Recently seeing a therapist through her health insurance pending Psychiatry appointment. Doesn't feel Hydroxyzine helping with sleep.   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 8 g 2   amLODipine (NORVASC) 10 MG tablet Take 1 tablet (10 mg total) by mouth every evening. 90 tablet 0   butalbital-acetaminophen-caffeine (FIORICET) 50-325-40 MG tablet Take 1-2 tablets by mouth every 6 (six) hours as needed for headache. 14 tablet 0   EPINEPHrine (EPIPEN 2-PAK) 0.3 mg/0.3 mL IJ SOAJ injection Inject 0.3 mg into the muscle as needed for anaphylaxis. (Patient taking differently: Inject 0.3 mg into the muscle once as needed for anaphylaxis.) 2 each 0   famotidine (PEPCID) 20 MG tablet Take 1 tablet (20  mg total) by mouth 2 (two) times daily. 60 tablet 3   hydrochlorothiazide (HYDRODIURIL) 12.5 MG tablet Take 1 tablet (12.5 mg total) by mouth daily. 30 tablet 1   metFORMIN (GLUCOPHAGE) 500 MG tablet Take 1 tablet (500 mg total) by mouth 2 (two) times daily with a meal. 180 tablet 0   nicotine (NICODERM CQ - DOSED IN MG/24 HOURS) 14 mg/24hr patch Place 1 patch (14 mg total) onto the skin daily. 28 patch 2   sertraline (ZOLOFT) 100 MG tablet Take 1 tablet (100 mg total) by mouth at bedtime. 30 tablet 3   [DISCONTINUED] dicyclomine (BENTYL) 20 MG tablet Take 1 tablet (20 mg total) by mouth 2 (two) times daily. 20 tablet 0   [DISCONTINUED] omeprazole (PRILOSEC) 20 MG capsule Take 1 capsule (20 mg total) by mouth daily. 30 capsule 1   No current facility-administered medications on file prior to visit.    Allergies  Allergen Reactions   Tramadol Other (See Comments)    Headache    Social History   Socioeconomic History   Marital status: Married    Spouse name: Not on file   Number of children: Not on file   Years of education: Not on file   Highest education level: Not on file  Occupational History   Not on file  Tobacco Use   Smoking status: Former    Packs/day: 0.10    Years: 20.00    Total pack years: 2.00    Types: Cigarettes    Quit date: 10/01/2021    Years since quitting: 0.6    Passive exposure: Never   Smokeless tobacco:  Never  Vaping Use   Vaping Use: Former  Substance and Sexual Activity   Alcohol use: Not Currently   Drug use: Never   Sexual activity: Not Currently    Partners: Male    Birth control/protection: Surgical    Comment: [place] 09/11/21  Other Topics Concern   Not on file  Social History Narrative   Not on file   Social Determinants of Health   Financial Resource Strain: Not on file  Food Insecurity: Not on file  Transportation Needs: Not on file  Physical Activity: Not on file  Stress: Not on file  Social Connections: Not on file  Intimate  Partner Violence: Not on file    Family History  Problem Relation Age of Onset   Hypertension Maternal Grandmother    Diabetes Maternal Grandfather    Allergic rhinitis Daughter    Allergic rhinitis Daughter    Allergic rhinitis Son    Allergic rhinitis Son    Colon cancer Neg Hx    Esophageal cancer Neg Hx    Rectal cancer Neg Hx    Stomach cancer Neg Hx     Past Surgical History:  Procedure Laterality Date   BLADDER SUSPENSION N/A 09/11/2021   Procedure: TRANSVAGINAL TAPE (TVT) PROCEDURE;  Surgeon: [name], MD;  Location: [facility];  Service: Gynecology;  Laterality: N/A;   CESAREAN SECTION  1997   x 1   CHOLECYSTECTOMY N/A 01/14/2019   Procedure: LAPAROSCOPIC CHOLECYSTECTOMY WITH POSSIBLE  INTRAOPERATIVE CHOLANGIOGRAM;  Surgeon: Armandina Gemma, MD;  Location: [facility];  Service: General;  Laterality: N/A;   CYSTOSCOPY N/A 09/11/2021   Procedure: Consuela Mimes;  Surgeon: [name], MD;  Location: [facility];  Service: Gynecology;  Laterality: N/A;   CYSTOSCOPY/URETEROSCOPY/HOLMIUM LASER Left 01/31/2022   Procedure: LEFT URETEROSCOPY/HOLMIUM LASER;  Surgeon: Vira Agar, MD;  Location: [facility];  Service: Urology;  Laterality: Left;   DILITATION & CURRETTAGE/HYSTROSCOPY WITH HYDROTHERMAL ABLATION N/A 06/22/2016   Procedure: DILATATION & CURETTAGE/HYSTEROSCOPY WITH HYDROTHERMAL ABLATION;  Surgeon: Shelly Bombard, MD;  Location: [facility];  Service: Gynecology;  Laterality: N/A;   TOTAL LAPAROSCOPIC HYSTERECTOMY WITH SALPINGECTOMY Bilateral 09/11/2021   Procedure: TOTAL LAPAROSCOPIC HYSTERECTOMY WITH SALPINGECTOMY;  Surgeon: [name], MD;  Location[facility];  Service: Gynecology;  Laterality: Bilateral;   TUBAL LIGATION     09-28-2015    ROS: Review of Systems Negative except as stated above  PHYSICAL EXAM: BP 121/88 (BP Location: Left Arm, Patient Position: Sitting, Cuff Size:  Large)   Pulse 97   Temp 98.3 F (36.8 C)   Resp 18   Ht 5' 2.01" (1.575 m)   Wt 190 lb (86.2 kg)   LMP 08/18/2021 (Exact Date)   SpO2 98%   BMI 34.74 kg/m   Physical Exam HENT:     Head: Normocephalic and atraumatic.  Eyes:     Extraocular Movements: Extraocular movements intact.     Conjunctiva/sclera: Conjunctivae normal.     Pupils: Pupils are equal, round, and reactive to light.  Cardiovascular:     Rate and Rhythm: Normal rate and regular rhythm.     Pulses: Normal pulses.     Heart sounds: Normal heart sounds.  Musculoskeletal:     Cervical back: Normal range of motion and neck supple.  Neurological:     General: No focal deficit present.     Mental Status: She is alert and oriented to person, place, and  time.  Psychiatric:        Mood and Affect: Mood normal.        Behavior: Behavior normal.    ASSESSMENT AND PLAN: 1. Essential (primary) hypertension - Continue Amlodipine and Hydrochlorothiazide as prescribed. No refills needed as of present.  - Counseled on blood pressure goal of less than 130/80, low-sodium, DASH diet, medication compliance, and 150 minutes of moderate intensity exercise per week as tolerated. Counseled on medication adherence and adverse effects. - BMP to evaluate kidney function and electrolyte balance. - Follow-up with primary provider in 4 months or sooner if needed. - Basic Metabolic Panel  2. Anxiety and depression - Patient denies thoughts of self-harm, suicidal ideations, homicidal ideations. - Continue Hydroxyzine as prescribed.  - Keep upcoming scheduled appointment with Psychiatry.  - hydrOXYzine (VISTARIL) 25 MG capsule; Take 1 capsule (25 mg total) by mouth every 8 (eight) hours as needed.  Dispense: 30 capsule; Refill: 2  3. Pruritus - Continue Hydroxyzine as prescribed.  - Follow-up with primary provider as scheduled.  - hydrOXYzine (VISTARIL) 25 MG capsule; Take 1 capsule (25 mg total) by mouth every 8 (eight) hours as  needed.  Dispense: 30 capsule; Refill: 2  4. Insomnia, unspecified type - Trazodone as prescribed. Counseled on medication adherence and adverse effects.  She is aware not to take with Hydroxyzine. - Keep upcoming scheduled appointment with Psychiatry.  - traZODone (DESYREL) 50 MG tablet; Take 1 tablet (50 mg total) by mouth at bedtime.  Dispense: 30 tablet; Refill: 0    Patient was given the opportunity to ask questions.  Patient verbalized understanding of the plan and was able to repeat key elements of the plan. Patient was given clear instructions to go to Emergency Department or return to medical center if symptoms don't improve, worsen, or new problems develop.The patient verbalized understanding.   Orders Placed This Encounter  Procedures   Basic Metabolic Panel     Requested Prescriptions   Signed Prescriptions Disp Refills   hydrOXYzine (VISTARIL) 25 MG capsule 30 capsule 2    Sig: Take 1 capsule (25 mg total) by mouth every 8 (eight) hours as needed.   traZODone (DESYREL) 50 MG tablet 30 tablet 0    Sig: Take 1 tablet (50 mg total) by mouth at bedtime.    Return in about 4 months (around 09/28/2022) for Follow-Up or next available chronic care mgmt.  [name], NP

## 2022-05-28 ENCOUNTER — Ambulatory Visit
Admission: RE | Admit: 2022-05-28 | Discharge: 2022-05-28 | Disposition: A | Discharge status: Home / Self Care | Payer: BC Managed Care – PPO | Source: Ambulatory Visit | Attending: Nurse Practitioner | Admitting: Nurse Practitioner

## 2022-05-28 DIAGNOSIS — D376 Neoplasm of uncertain behavior of liver, gallbladder and bile ducts: Secondary | ICD-10-CM

## 2022-05-28 DIAGNOSIS — Z9049 Acquired absence of other specified parts of digestive tract: Secondary | ICD-10-CM | POA: Diagnosis not present

## 2022-05-28 DIAGNOSIS — K7689 Other specified diseases of liver: Secondary | ICD-10-CM | POA: Diagnosis not present

## 2022-05-28 MED ORDER — GADOBENATE DIMEGLUMINE 529 MG/ML IV SOLN
18.0000 mL | Freq: Once | INTRAVENOUS | Status: AC | PRN
Start: 2022-05-28 — End: 2022-05-28
  Administered 2022-05-28: 18 mL via INTRAVENOUS

## 2022-05-29 ENCOUNTER — Ambulatory Visit (INDEPENDENT_AMBULATORY_CARE_PROVIDER_SITE_OTHER): Payer: BC Managed Care – PPO | Admitting: Family

## 2022-05-29 ENCOUNTER — Encounter: Payer: Self-pay | Admitting: Family

## 2022-05-29 VITALS — BP 121/88 | HR 97 | Temp 98.3°F | Resp 18 | Ht 62.01 in | Wt 190.0 lb

## 2022-05-29 DIAGNOSIS — F419 Anxiety disorder, unspecified: Secondary | ICD-10-CM | POA: Diagnosis not present

## 2022-05-29 DIAGNOSIS — G4700 Insomnia, unspecified: Secondary | ICD-10-CM

## 2022-05-29 DIAGNOSIS — L299 Pruritus, unspecified: Secondary | ICD-10-CM

## 2022-05-29 DIAGNOSIS — I10 Essential (primary) hypertension: Secondary | ICD-10-CM

## 2022-05-29 DIAGNOSIS — F32A Depression, unspecified: Secondary | ICD-10-CM

## 2022-05-29 DIAGNOSIS — G4719 Other hypersomnia: Secondary | ICD-10-CM | POA: Diagnosis not present

## 2022-05-29 DIAGNOSIS — R0681 Apnea, not elsewhere classified: Secondary | ICD-10-CM | POA: Diagnosis not present

## 2022-05-29 MED ORDER — HYDROXYZINE PAMOATE 25 MG PO CAPS: 25.0000 mg | ORAL_CAPSULE | Freq: Three times a day (TID) | ORAL | 2 refills | Status: DC | PRN

## 2022-05-29 MED ORDER — TRAZODONE HCL 50 MG PO TABS: 50.0000 mg | ORAL_TABLET | Freq: Every day | ORAL | 0 refills | Status: DC

## 2022-05-29 NOTE — Progress Notes (Signed)
Pt presents for hypertension follow-up  Needs refill on Hydroxyzine

## 2022-06-20 ENCOUNTER — Encounter (HOSPITAL_COMMUNITY): Payer: Self-pay | Admitting: Psychiatry

## 2022-06-20 ENCOUNTER — Ambulatory Visit (HOSPITAL_BASED_OUTPATIENT_CLINIC_OR_DEPARTMENT_OTHER): Payer: BC Managed Care – PPO | Admitting: Psychiatry

## 2022-06-20 VITALS — Wt 190.0 lb

## 2022-06-20 DIAGNOSIS — F321 Major depressive disorder, single episode, moderate: Secondary | ICD-10-CM | POA: Diagnosis not present

## 2022-06-20 DIAGNOSIS — F4310 Post-traumatic stress disorder, unspecified: Secondary | ICD-10-CM

## 2022-06-20 MED ORDER — AMITRIPTYLINE HCL 25 MG PO TABS: 25.0000 mg | ORAL_TABLET | Freq: Every day | ORAL | 0 refills | Status: DC

## 2022-06-20 MED ORDER — LAMOTRIGINE 25 MG PO TABS: ORAL_TABLET | ORAL | 0 refills | Status: DC

## 2022-06-20 NOTE — Progress Notes (Signed)
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 hydroxyzine 25 mg as needed for anxiety and trazodone 50 mg at bedtime.  Patient feels these medicines not helping her symptoms.  She like to try a different medication.  I reviewed blood work results, current medication, psychosocial stressors.  Recommend to try amitriptyline 25 to 50 mg at bedtime to help sleep, anxiety, PTSD symptoms.  Start Lamictal 25 mg daily for 1 week and then 50 mg daily to help her mood symptoms.  Recommend to reduce Zoloft  from 100 mg twice a day to take only 100 mg daily.  Discontinue hydroxyzine and trazodone since it is not working.  I do feel she need to see a therapist for her PTSD symptoms.  We will refer her to see a therapist in our office for coping skills.  Discussed safety concerns that anytime having active suicidal thoughts or homicidal thought that she need to call 911 or go to local emergency room.  Discussed Lamictal side effects that if in case she has a rash then she need to stop the medication immediately.  Follow up in 3 weeks.  Kathlee Nations, MD 06/20/2022    Follow Up Instructions: I discussed the assessment and treatment plan with the patient. The patient was provided an opportunity to ask questions and all were answered. The patient agreed with the plan and demonstrated an understanding of the instructions.   The patient was advised to call back or seek an in-person evaluation if the symptoms worsen or if the condition fails to improve as anticipated.   Collaboration of Care: Primary Care Provider AEB notes are available in epic to review.   Patient/Guardian was advised Release of Information must be obtained prior to any record release in order to collaborate their care with an outside provider. Patient/Guardian was advised if they have not already done so to contact the registration department to sign all necessary forms in order for [place] to release information regarding their care.    Consent: Patient/Guardian gives verbal consent for treatment and assignment of benefits for services provided during this visit. Patient/Guardian expressed understanding and agreed to proceed.     I provided 70 minutes of non-face-to-face time during this encounter.

## 2022-07-18 ENCOUNTER — Ambulatory Visit: Payer: BC Managed Care – PPO | Admitting: Allergy

## 2022-07-25 ENCOUNTER — Encounter (HOSPITAL_COMMUNITY): Payer: Self-pay | Admitting: Psychiatry

## 2022-07-25 ENCOUNTER — Ambulatory Visit (HOSPITAL_BASED_OUTPATIENT_CLINIC_OR_DEPARTMENT_OTHER): Payer: BC Managed Care – PPO | Admitting: Psychiatry

## 2022-07-25 DIAGNOSIS — F321 Major depressive disorder, single episode, moderate: Secondary | ICD-10-CM

## 2022-07-25 DIAGNOSIS — F4310 Post-traumatic stress disorder, unspecified: Secondary | ICD-10-CM

## 2022-07-25 MED ORDER — LAMOTRIGINE 100 MG PO TABS: 100.0000 mg | ORAL_TABLET | Freq: Every day | ORAL | 1 refills | Status: DC

## 2022-07-25 MED ORDER — SERTRALINE HCL 50 MG PO TABS: 50.0000 mg | ORAL_TABLET | Freq: Every day | ORAL | 0 refills | Status: DC

## 2022-07-25 MED ORDER — ESCITALOPRAM OXALATE 10 MG PO TABS: ORAL_TABLET | ORAL | 0 refills | Status: DC
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
## 2022-07-26 ENCOUNTER — Telehealth (HOSPITAL_COMMUNITY): Payer: BC Managed Care – PPO | Admitting: Psychiatry

## 2022-07-26 DIAGNOSIS — G4733 Obstructive sleep apnea (adult) (pediatric): Secondary | ICD-10-CM | POA: Diagnosis not present

## 2022-07-27 ENCOUNTER — Other Ambulatory Visit (INDEPENDENT_AMBULATORY_CARE_PROVIDER_SITE_OTHER): Payer: BC Managed Care – PPO

## 2022-07-27 DIAGNOSIS — Z23 Encounter for immunization: Secondary | ICD-10-CM

## 2022-07-27 DIAGNOSIS — I10 Essential (primary) hypertension: Secondary | ICD-10-CM | POA: Diagnosis not present

## 2022-07-28 LAB — BASIC METABOLIC PANEL
BUN/Creatinine Ratio: 16 (ref 9–23)
BUN: 11 mg/dL (ref 6–24)
CO2: 19 mmol/L — ABNORMAL LOW (ref 20–29)
Calcium: 8.9 mg/dL (ref 8.7–10.2)
Chloride: 102 mmol/L (ref 96–106)
Creatinine, Ser: 0.67 mg/dL (ref 0.57–1.00)
Glucose: 107 mg/dL — ABNORMAL HIGH (ref 70–99)
Potassium: 4.5 mmol/L (ref 3.5–5.2)
Sodium: 137 mmol/L (ref 134–144)
eGFR: 109 mL/min/{1.73_m2} (ref >59)

## 2022-07-28 LAB — SPECIMEN STATUS REPORT

## 2022-07-29 NOTE — Progress Notes (Unsigned)
Follow Up Note  RE: Brianna Gutierrez MRN: [id number] DOB: Dec 03, 1975 Date of Office Visit: 07/30/2022  Referring provider: [name], NP Primary care provider: [name], NP  Chief Complaint: No chief complaint on file.  History of Present Illness: I had the pleasure of seeing Brianna Gutierrez for a follow up visit at the [facility] on 07/29/2022. She is a 46 y.o. female, who is being followed for urticaria, shortness of breath, allergic rhinitis. Her previous allergy office visit was on 04/18/2022 with Dr. Maudie Gutierrez. Today is a regular follow up visit.   Called patient and reviewed results. Avoid pork and lamb for now - noted itching after eating pork in the past.  Okay to reintroduce strawberries and bananas at home. Doing better with zyrtec and Pepcid BID - continue until next visit. Will recheck sed rate and CBC diff at next visit - elevated ESR and RBC. Advised patient to hydrate. CMP - slightly elevated AP - will recheck this too.  Environmental panel positive to cat and dog - no pets at home but pets at grocery store a times. ANA, CU, TSH - normal. Keep August appointment.   Urticaria Pruritic rash for 1 year lasting 1 day at a time.  No triggers noted.  Occurs a few times per month.  Concerned whether strawberries, bananas and seafood may be contributing to the symptoms.  Saw dermatology virtually in the past with no benefit. Today's skin prick testing showed: Positive to mold and dog.  Negative to foods.  Based on clinical history concerning for urticaria.  See below for proper skin care. Use fragrance free and dye free products. No dryer sheets or fabric softener.  Start zyrtec (cetirizine) 20m twice a day. If symptoms are not controlled or causes drowsiness let [place]. Start Pepcid (famotidine) 234mtwice a day.  Avoid the following potential triggers: alcohol, tight clothing, NSAIDs, hot showers and getting overheated. Get bloodwork  to rule out other etiologies.  If negative to banana and strawberry then will reintroduce at home.   Shortness of breath Dyspnea on exertion and uses albuterol as needed with good benefit.  Has been using it daily since prescribed 1 month ago.  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  CT chest in April 2023 was unremarkable. Today's spirometry showed restriction with 2% improvement FEV1 postbronchodilator treatment.  Clinically feeling swelling improved. Monitor symptoms. Only use albuterol if you have symptoms more than 5 minutes after you stop your activities. If symptoms resolve within a few minutes then shortness of breath most likely due to physical deconditioning. May use albuterol rescue inhaler 2 puffs every 4 to 6 hours as needed for shortness of breath, chest tightness, coughing, and wheezing.  Monitor frequency of use.    Other allergic rhinitis Increased rhinitis symptoms recently.  No prior allergy testing. Today's skin prick testing showed: Positive to mold and dog.  Will get blood work instead of intradermal testing as need to get bloodwork for other reasons.  Start environmental control measures as below. The zyrtec recommend as above should also help with these symptoms.    Return in about 6 weeks (around 05/30/2022).   Follow-up with PCP regarding elevated blood pressure in the office. Unable to draw all the blood work today and patient will return later this month to draw rest of the blood work.  Assessment and Plan: Brianna Gutierrez a [age].o. female with: No problem-specific Assessment & Plan  notes found for this encounter.  No follow-ups on file.  No orders of the defined types were placed in this encounter.  Lab Orders  No laboratory test(s) ordered today    Diagnostics: Spirometry:  Tracings reviewed. Her effort: {Blank single:19197::"Good reproducible efforts.","It was hard to get consistent efforts and there is a question as to whether  this reflects a maximal maneuver.","Poor effort, data can not be interpreted."} FVC: ***L FEV1: ***L, ***% predicted FEV1/FVC ratio: ***% Interpretation: {Blank single:19197::"Spirometry consistent with mild obstructive disease","Spirometry consistent with moderate obstructive disease","Spirometry consistent with severe obstructive disease","Spirometry consistent with possible restrictive disease","Spirometry consistent with mixed obstructive and restrictive disease","Spirometry uninterpretable due to technique","Spirometry consistent with normal pattern","No overt abnormalities noted given today's efforts"}.  Please see scanned spirometry results for details.  Skin Testing: {Blank single:19197::"Select foods","Environmental allergy panel","Environmental allergy panel and select foods","Food allergy panel","None","Deferred due to recent antihistamines use"}. *** Results discussed with patient/family.   Medication List:  Current Outpatient Medications  Medication Sig Dispense Refill   albuterol (VENTOLIN HFA) 108 (90 Base) MCG/ACT inhaler Inhale 2 puffs into the lungs every 6 (six) hours as needed for wheezing or shortness of breath. 8 g 2   amLODipine (NORVASC) 10 MG tablet Take 1 tablet (10 mg total) by mouth every evening. 90 tablet 0   butalbital-acetaminophen-caffeine (FIORICET) 50-325-40 MG tablet Take 1-2 tablets by mouth every 6 (six) hours as needed for headache. 14 tablet 0   EPINEPHrine (EPIPEN 2-PAK) 0.3 mg/0.3 mL IJ SOAJ injection Inject 0.3 mg into the muscle as needed for anaphylaxis. (Patient taking differently: Inject 0.3 mg into the muscle once as needed for anaphylaxis.) 2 each 0   escitalopram (LEXAPRO) 10 MG tablet Take 0.5 tablets (5 mg total) by mouth daily for 14 days, THEN 1 tablet (10 mg total) daily for 16 days. 30 tablet 0   famotidine (PEPCID) 20 MG tablet Take 1 tablet (20 mg total) by mouth 2 (two) times daily. 60 tablet 3   hydrochlorothiazide (HYDRODIURIL) 12.5 MG  tablet Take 1 tablet (12.5 mg total) by mouth daily. 30 tablet 1   lamoTRIgine (LAMICTAL) 100 MG tablet Take 1 tablet (100 mg total) by mouth daily. 30 tablet 1   metFORMIN (GLUCOPHAGE) 500 MG tablet Take 1 tablet (500 mg total) by mouth 2 (two) times daily with a meal. 180 tablet 0   nicotine (NICODERM CQ - DOSED IN MG/24 HOURS) 14 mg/24hr patch Place 1 patch (14 mg total) onto the skin daily. 28 patch 2   sertraline (ZOLOFT) 50 MG tablet Take 1 tablet (50 mg total) by mouth daily. Stop after 14 days 14 tablet 0   No current facility-administered medications for this visit.   Allergies: Allergies  Allergen Reactions   Tramadol Other (See Comments)    Headache   I reviewed her past medical history, social history, family history, and environmental history and no significant changes have been reported from her previous visit.  Review of Systems  Constitutional:  Negative for appetite change, chills, fever and unexpected weight change.  HENT:  Negative for congestion and rhinorrhea.   Eyes:  Negative for itching.  Respiratory:  Positive for shortness of breath. Negative for cough, chest tightness and wheezing.   Cardiovascular:  Negative for chest pain.  Gastrointestinal:  Negative for abdominal pain.  Genitourinary:  Negative for difficulty urinating.  Skin:  Positive for rash.  Allergic/Immunologic: Positive for environmental allergies.  Neurological:  Negative for headaches.    Objective: LMP 08/18/2021 (Exact Date)  There is no  height or weight on file to calculate BMI. Physical Exam Vitals and nursing note reviewed.  Constitutional:      Appearance: Normal appearance. She is well-developed. She is obese.  HENT:     Head: Normocephalic and atraumatic.     Right Ear: Tympanic membrane and external ear normal.     Left Ear: Tympanic membrane and external ear normal.     Nose: Nose normal.     Mouth/Throat:     Mouth: Mucous membranes are moist.     Pharynx: Oropharynx is  clear.  Eyes:     Conjunctiva/sclera: Conjunctivae normal.  Cardiovascular:     Rate and Rhythm: Normal rate and regular rhythm.     Heart sounds: Normal heart sounds. No murmur heard.    No friction rub. No gallop.  Pulmonary:     Effort: Pulmonary effort is normal.     Breath sounds: Normal breath sounds. No wheezing, rhonchi or rales.  Musculoskeletal:     Cervical back: Neck supple.  Skin:    General: Skin is warm.     Findings: No rash.  Neurological:     Mental Status: She is alert and oriented to person, place, and time.  Psychiatric:        Behavior: Behavior normal.    Previous notes and tests were reviewed. The plan was reviewed with the patient/family, and all questions/concerned were addressed.  It was my pleasure to see Brianna Gutierrez today and participate in her care. Please feel free to contact me with any questions or concerns.  Sincerely,  Rexene Alberts, DO Allergy & Immunology  [facility] office: [place] office: [phone]

## 2022-07-30 ENCOUNTER — Encounter: Payer: Self-pay | Admitting: Allergy

## 2022-07-30 ENCOUNTER — Encounter: Payer: Self-pay | Admitting: Family

## 2022-07-30 ENCOUNTER — Ambulatory Visit (INDEPENDENT_AMBULATORY_CARE_PROVIDER_SITE_OTHER): Payer: BC Managed Care – PPO | Admitting: Allergy

## 2022-07-30 ENCOUNTER — Ambulatory Visit (INDEPENDENT_AMBULATORY_CARE_PROVIDER_SITE_OTHER): Payer: BC Managed Care – PPO | Admitting: Family

## 2022-07-30 VITALS — BP 140/70 | HR 108 | Temp 98.4°F | Resp 16 | Wt 193.8 lb

## 2022-07-30 VITALS — BP 118/78 | HR 80 | Temp 98.3°F | Resp 16 | Ht 62.01 in | Wt 190.0 lb

## 2022-07-30 DIAGNOSIS — R7303 Prediabetes: Secondary | ICD-10-CM

## 2022-07-30 DIAGNOSIS — I10 Essential (primary) hypertension: Secondary | ICD-10-CM

## 2022-07-30 DIAGNOSIS — M792 Neuralgia and neuritis, unspecified: Secondary | ICD-10-CM

## 2022-07-30 DIAGNOSIS — G4700 Insomnia, unspecified: Secondary | ICD-10-CM

## 2022-07-30 DIAGNOSIS — L509 Urticaria, unspecified: Secondary | ICD-10-CM

## 2022-07-30 DIAGNOSIS — R0602 Shortness of breath: Secondary | ICD-10-CM

## 2022-07-30 DIAGNOSIS — J3089 Other allergic rhinitis: Secondary | ICD-10-CM | POA: Diagnosis not present

## 2022-07-30 DIAGNOSIS — G5793 Unspecified mononeuropathy of bilateral lower limbs: Secondary | ICD-10-CM | POA: Diagnosis not present

## 2022-07-30 DIAGNOSIS — F32A Depression, unspecified: Secondary | ICD-10-CM

## 2022-07-30 DIAGNOSIS — F419 Anxiety disorder, unspecified: Secondary | ICD-10-CM

## 2022-07-30 MED ORDER — GABAPENTIN 300 MG PO CAPS: 300.0000 mg | ORAL_CAPSULE | Freq: Every day | ORAL | 2 refills | Status: DC

## 2022-07-30 MED ORDER — AMLODIPINE BESYLATE 10 MG PO TABS: 10.0000 mg | ORAL_TABLET | Freq: Every evening | ORAL | 2 refills | Status: DC

## 2022-07-30 MED ORDER — METFORMIN HCL 500 MG PO TABS: 500.0000 mg | ORAL_TABLET | Freq: Two times a day (BID) | ORAL | 2 refills | Status: DC

## 2022-07-30 MED ORDER — HYDROCHLOROTHIAZIDE 12.5 MG PO TABS: 12.5000 mg | ORAL_TABLET | Freq: Every day | ORAL | 2 refills | Status: DC

## 2022-07-30 NOTE — Patient Instructions (Addendum)
Skin: Continue proper skin care. Keep track of flares. Continue to avoid pork, lamb and beef jerky. Okay to eat beef but if you notice issues then let us know.  Get bloodwork We are ordering labs, so please allow 1-2 weeks for the results to come back. With the newly implemented Cures Act, the labs might be visible to you at the same time that they become visible to me. However, I will not address the results until all of the results are back, so please be patient.  In the meantime, continue recommendations in your patient instructions, including avoidance measures (if applicable), until you hear from me.  Step down schedule  Decrease Pepcid to '20mg'$  once a day. Continue with zyrtec '10mg'$  twice a day. If no hives/itching for 2 weeks then: Decrease zyrtec to '10mg'$  once a day. Continue Pepcid '20mg'$  once a day. If you get hives then go back to the dose where you didn't have any symptoms.   Environmental allergies 2023 skin testing showed: Positive to mold and dog.  Continue environmental control measures.  The zyrtec recommend as above should also help with these symptoms.   Shortness of breath Monitor symptoms. Only use albuterol if you have symptoms more than 5 minutes after your stop your activities. May use albuterol rescue inhaler 2 puffs every 4 to 6 hours as needed for shortness of breath, chest tightness, coughing, and wheezing.  Monitor frequency of use.   Elevated blood pressure Please follow up with your PCP regarding this.  Follow up in 3 months or sooner if needed.   Skin care recommendations  Bath time: Always use lukewarm water. AVOID very hot or cold water. Keep bathing time to 5-10 minutes. Do NOT use bubble bath. Use a mild soap and use just enough to wash the dirty areas. Do NOT scrub skin vigorously.  After bathing, pat dry your skin with a towel. Do NOT rub or scrub the skin.  Moisturizers and prescriptions:  ALWAYS apply moisturizers immediately after bathing  (within 3 minutes). This helps to lock-in moisture. Use the moisturizer several times a day over the whole body. Good summer moisturizers include: Aveeno, CeraVe, Cetaphil. Good winter moisturizers include: Aquaphor, Vaseline, Cerave, Cetaphil, Eucerin, Vanicream. When using moisturizers along with medications, the moisturizer should be applied about one hour after applying the medication to prevent diluting effect of the medication or moisturize around where you applied the medications. When not using medications, the moisturizer can be continued twice daily as maintenance.  Laundry and clothing: Avoid laundry products with added color or perfumes. Use unscented hypo-allergenic laundry products such as Tide free, Cheer free & gentle, and All free and clear.  If the skin still seems dry or sensitive, you can try double-rinsing the clothes. Avoid tight or scratchy clothing such as wool. Do not use fabric softeners or dyer sheets.   Mold Control Mold and fungi can grow on a variety of surfaces provided certain temperature and moisture conditions exist.  Outdoor molds grow on plants, decaying vegetation and soil. The major outdoor mold, Alternaria and Cladosporium, are found in very high numbers during hot and dry conditions. Generally, a late summer - fall peak is seen for common outdoor fungal spores. Rain will temporarily lower outdoor mold spore count, but counts rise rapidly when the rainy period ends. The most important indoor molds are Aspergillus and Penicillium. Dark, humid and poorly ventilated basements are ideal sites for mold growth. The next most common sites of mold growth are the bathroom and  the kitchen. Outdoor (Seasonal) Mold Control Use air conditioning and keep windows closed. Avoid exposure to decaying vegetation. Avoid leaf raking. Avoid grain handling. Consider wearing a face mask if working in moldy areas.  Indoor (Perennial) Mold Control  Maintain humidity below  50%. Get rid of mold growth on hard surfaces with water, detergent and, if necessary, 5% bleach (do not mix with other cleaners). Then dry the area completely. If mold covers an area more than 10 square feet, consider hiring an indoor environmental professional. For clothing, washing with soap and water is best. If moldy items cannot be cleaned and dried, throw them away. Remove sources e.g. contaminated carpets. Repair and seal leaking roofs or pipes. Using dehumidifiers in damp basements may be helpful, but empty the water and clean units regularly to prevent mildew from forming. All rooms, especially basements, bathrooms and kitchens, require ventilation and cleaning to deter mold and mildew growth. Avoid carpeting on concrete or damp floors, and storing items in damp areas. Pet Allergen Avoidance: Contrary to popular opinion, there are no "hypoallergenic" breeds of dogs or cats. That is because people are not allergic to an animal's hair, but to an allergen found in the animal's saliva, dander (dead skin flakes) or urine. Pet allergy symptoms typically occur within minutes. For some people, symptoms can build up and become most severe 8 to 12 hours after contact with the animal. People with severe allergies can experience reactions in public places if dander has been transported on the pet owners' clothing. Keeping an animal outdoors is only a partial solution, since homes with pets in the yard still have higher concentrations of animal allergens. Before getting a pet, ask your allergist to determine if you are allergic to animals. If your pet is already considered part of your family, try to minimize contact and keep the pet out of the bedroom and other rooms where you spend a great deal of time. As with dust mites, vacuum carpets often or replace carpet with a hardwood floor, tile or linoleum. High-efficiency particulate air (HEPA) cleaners can reduce allergen levels over time. While dander and  saliva are the source of cat and dog allergens, urine is the source of allergens from rabbits, hamsters, mice and [place] pigs; so ask a non-allergic family member to clean the animal's cage. If you have a pet allergy, talk to your allergist about the potential for allergy immunotherapy (allergy shots). This strategy can often provide long-term relief.

## 2022-07-30 NOTE — Progress Notes (Signed)
Patient ID: Brianna Gutierrez, female    DOB: 1976/03/05  MRN: [id number]  CC: Chronic Care Management  Subjective: Brianna Gutierrez is a 46 y.o. female who presents for chronic care management.   Her concerns today include: - Presents for prediabetes lab. States she was in office on 07/27/2022 for lab only appointment and requested for prediabetes lab to be rechecked. States she told the rooming CMA that she was present for prediabetes lab but noticed in MyChart prediabetes wasn't checked only kidney function. Also, states she called office on last week requesting refill of Metformin and was told she needs an appointment prior to refill from whomever scheduled today's appointment.  - Tingling hands and feet x 2 weeks. Denies additional symptoms.  - Established with psychiatry and sessions going well. Reports she is no longer taking Hydroxyzine and Trazodone. Reports these medications were changed per psychiatry. - Reports she had her sleep study. Diagnosed with sleep apnea. Currently in the process of getting CPAP.  - Doing well on blood pressure medications without issues or concerns.    Patient Active Problem List   Diagnosis Date Noted   Essential hypertension 04/30/2022   Urticaria 04/19/2022   Shortness of breath 04/19/2022   Other allergic rhinitis 04/19/2022   Pulmonary embolism ([place]) 11/30/2021   Smoking 10/26/2021   Postoperative intra-abdominal abscess    Medication monitoring encounter    Pelvic infection in female 09/24/2021   Status post laparoscopic hysterectomy 09/11/2021   Cholelithiasis 01/13/2019   Pre-diabetes 11/15/2016   History of bilateral tubal ligation 09/28/2015   Tobacco abuse 09/28/2015     Current Outpatient Medications on File Prior to Visit  Medication Sig Dispense Refill   albuterol (VENTOLIN HFA) 108 (90 Base) MCG/ACT inhaler Inhale 2 puffs into the lungs every 6 (six) hours as needed for wheezing or shortness of breath. 8 g 2    butalbital-acetaminophen-caffeine (FIORICET) 50-325-40 MG tablet Take 1-2 tablets by mouth every 6 (six) hours as needed for headache. 14 tablet 0   EPINEPHrine (EPIPEN 2-PAK) 0.3 mg/0.3 mL IJ SOAJ injection Inject 0.3 mg into the muscle as needed for anaphylaxis. (Patient taking differently: Inject 0.3 mg into the muscle once as needed for anaphylaxis.) 2 each 0   escitalopram (LEXAPRO) 10 MG tablet Take 0.5 tablets (5 mg total) by mouth daily for 14 days, THEN 1 tablet (10 mg total) daily for 16 days. 30 tablet 0   famotidine (PEPCID) 20 MG tablet Take 1 tablet (20 mg total) by mouth 2 (two) times daily. 60 tablet 3   lamoTRIgine (LAMICTAL) 100 MG tablet Take 1 tablet (100 mg total) by mouth daily. 30 tablet 1   nicotine (NICODERM CQ - DOSED IN MG/24 HOURS) 14 mg/24hr patch Place 1 patch (14 mg total) onto the skin daily. 28 patch 2   sertraline (ZOLOFT) 50 MG tablet Take 1 tablet (50 mg total) by mouth daily. Stop after 14 days 14 tablet 0   [DISCONTINUED] dicyclomine (BENTYL) 20 MG tablet Take 1 tablet (20 mg total) by mouth 2 (two) times daily. 20 tablet 0   [DISCONTINUED] omeprazole (PRILOSEC) 20 MG capsule Take 1 capsule (20 mg total) by mouth daily. 30 capsule 1   No current facility-administered medications on file prior to visit.    Allergies  Allergen Reactions   Tramadol Other (See Comments)    Headache    Social History   Socioeconomic History   Marital status: Married    Spouse name: Not on file  Number of children: Not on file   Years of education: Not on file   Highest education level: Not on file  Occupational History   Not on file  Tobacco Use   Smoking status: Former    Packs/day: 0.10    Years: 20.00    Total pack years: 2.00    Types: Cigarettes    Quit date: 10/01/2021    Years since quitting: 0.8    Passive exposure: Never   Smokeless tobacco: Never  Vaping Use   Vaping Use: Former  Substance and Sexual Activity   Alcohol use: Not Currently   Drug  use: Never   Sexual activity: Not Currently    Partners: Male    Birth control/protection: Surgical    Comment: [place] 09/11/21  Other Topics Concern   Not on file  Social History Narrative   Not on file   Social Determinants of Health   Financial Resource Strain: Not on file  Food Insecurity: Not on file  Transportation Needs: Not on file  Physical Activity: Not on file  Stress: Not on file  Social Connections: Not on file  Intimate Partner Violence: Not on file    Family History  Problem Relation Age of Onset   Hypertension Maternal Grandmother    Diabetes Maternal Grandfather    Allergic rhinitis Daughter    Allergic rhinitis Daughter    Allergic rhinitis Son    Allergic rhinitis Son    Colon cancer Neg Hx    Esophageal cancer Neg Hx    Rectal cancer Neg Hx    Stomach cancer Neg Hx     Past Surgical History:  Procedure Laterality Date   BLADDER SUSPENSION N/A 09/11/2021   Procedure: TRANSVAGINAL TAPE (TVT) PROCEDURE;  Surgeon: [name], MD;  Location: [facility];  Service: Gynecology;  Laterality: N/A;   CESAREAN SECTION  1997   x 1   CHOLECYSTECTOMY N/A 01/14/2019   Procedure: LAPAROSCOPIC CHOLECYSTECTOMY WITH POSSIBLE  INTRAOPERATIVE CHOLANGIOGRAM;  Surgeon: Armandina Gemma, MD;  Location: [facility];  Service: General;  Laterality: N/A;   CYSTOSCOPY N/A 09/11/2021   Procedure: Consuela Mimes;  Surgeon: [name], MD;  Location: [facility];  Service: Gynecology;  Laterality: N/A;   CYSTOSCOPY/URETEROSCOPY/HOLMIUM LASER Left 01/31/2022   Procedure: LEFT URETEROSCOPY/HOLMIUM LASER;  Surgeon: Vira Agar, MD;  Location: [facility];  Service: Urology;  Laterality: Left;   DILITATION & CURRETTAGE/HYSTROSCOPY WITH HYDROTHERMAL ABLATION N/A 06/22/2016   Procedure: DILATATION & CURETTAGE/HYSTEROSCOPY WITH HYDROTHERMAL ABLATION;  Surgeon: Shelly Bombard, MD;  Location: [facility];  Service: Gynecology;  Laterality: N/A;    TOTAL LAPAROSCOPIC HYSTERECTOMY WITH SALPINGECTOMY Bilateral 09/11/2021   Procedure: TOTAL LAPAROSCOPIC HYSTERECTOMY WITH SALPINGECTOMY;  Surgeon: [name], MD;  Location: [facility];  Service: Gynecology;  Laterality: Bilateral;   TUBAL LIGATION     09-28-2015    ROS: Review of Systems Negative except as stated above  PHYSICAL EXAM: BP 118/78 (BP Location: Left Arm, Patient Position: Sitting, Cuff Size: Large)   Pulse 80   Temp 98.3 F (36.8 C)   Resp 16   Ht 5' 2.01" (1.575 m)   Wt 190 lb (86.2 kg)   LMP 08/18/2021 (Exact Date)   SpO2 100%   BMI 34.74 kg/m   Physical Exam HENT:     Head: Normocephalic and atraumatic.  Eyes:     Extraocular Movements: Extraocular movements intact.     Conjunctiva/sclera: Conjunctivae normal.  Pupils: Pupils are equal, round, and reactive to light.  Cardiovascular:     Rate and Rhythm: Normal rate and regular rhythm.     Pulses: Normal pulses.     Heart sounds: Normal heart sounds.  Pulmonary:     Effort: Pulmonary effort is normal.     Breath sounds: Normal breath sounds.  Musculoskeletal:     Cervical back: Normal range of motion and neck supple.  Neurological:     General: No focal deficit present.     Mental Status: She is alert and oriented to person, place, and time.  Psychiatric:        Mood and Affect: Mood normal.        Behavior: Behavior normal.     ASSESSMENT AND PLAN: 1. Prediabetes - Update screening.  - Continue Metformin as prescribed. - Follow-up with primary provider as scheduled.  - Hemoglobin A1c - metFORMIN (GLUCOPHAGE) 500 MG tablet; Take 1 tablet (500 mg total) by mouth 2 (two) times daily with a meal.  Dispense: 60 tablet; Refill: 2  2. Primary hypertension - Continue Amlodipine and Hydrochlorothiazide as prescribed.  - Counseled on blood pressure goal of less than 130/80, low-sodium, DASH diet, medication compliance, and 150 minutes of moderate intensity exercise  per week as tolerated. Counseled on medication adherence and adverse effects. - Follow-up with primary provider in 3 months or sooner if needed. - amLODipine (NORVASC) 10 MG tablet; Take 1 tablet (10 mg total) by mouth every evening.  Dispense: 30 tablet; Refill: 2 - hydrochlorothiazide (HYDRODIURIL) 12.5 MG tablet; Take 1 tablet (12.5 mg total) by mouth daily.  Dispense: 30 tablet; Refill: 2  3. Neuropathic pain of both legs 4. Neuropathic pain of finger, unspecified laterality - Trial Gabapentin as prescribed. Counseled on medication adherence and adverse effects.  - Follow-up with primary provider in 4 weeks or sooner if needed.  - gabapentin (NEURONTIN) 300 MG capsule; Take 1 capsule (300 mg total) by mouth at bedtime.  Dispense: 30 capsule; Refill: 2  5. Anxiety and depression 6. Insomnia, unspecified type - Keep all scheduled appointments with Psychiatry.   Patient was given the opportunity to ask questions.  Patient verbalized understanding of the plan and was able to repeat key elements of the plan. Patient was given clear instructions to go to Emergency Department or return to medical center if symptoms don't improve, worsen, or new problems develop.The patient verbalized understanding.   Orders Placed This Encounter  Procedures   Hemoglobin A1c     Requested Prescriptions   Signed Prescriptions Disp Refills   gabapentin (NEURONTIN) 300 MG capsule 30 capsule 2    Sig: Take 1 capsule (300 mg total) by mouth at bedtime.   metFORMIN (GLUCOPHAGE) 500 MG tablet 60 tablet 2    Sig: Take 1 tablet (500 mg total) by mouth 2 (two) times daily with a meal.   amLODipine (NORVASC) 10 MG tablet 30 tablet 2    Sig: Take 1 tablet (10 mg total) by mouth every evening.   hydrochlorothiazide (HYDRODIURIL) 12.5 MG tablet 30 tablet 2    Sig: Take 1 tablet (12.5 mg total) by mouth daily.    Return in about 3 months (around 10/30/2022) for Follow-Up or next available chronic care mgmt.  [name], NP

## 2022-07-30 NOTE — Assessment & Plan Note (Signed)
Past history - Increased rhinitis symptoms recently.  2023 skin prick testing showed: Positive to mold and dog.  Interim history - asymptomatic.   Continue environmental control measures.   The zyrtec recommend as above should also help with these symptoms.

## 2022-07-30 NOTE — Assessment & Plan Note (Signed)
>>  ASSESSMENT AND PLAN FOR PRURITUS/URTICARIA WRITTEN ON 07/30/2022  5:00 PM BY Garnet Sierras, DO  Past history - Pruritic rash for 1 year lasting 1 day at a time.  No triggers noted.  Occurs a few times per month.  Concerned whether strawberries, bananas and seafood may be contributing to the symptoms.  Saw dermatology virtually in the past with no benefit. 2023 skin prick testing showed: Positive to mold and dog.  Negative to foods. Interim history - 2023 bloodwork slightly positive to pork and lamb, elevated RBC, AP and ESR. Rash much improved after eliminating pork from diet and taking zyrtec and famotidine. Had a flare after eating a beef jerky. No issues with other beef products. Continue proper skin care. Keep track of flares. Continue to avoid pork, lamb and beef jerky. Okay to eat beef but if you notice issues then let us know. May try strawberry and bananas at home. Get bloodwork Step down schedule  Decrease Pepcid to 80m once a day. Continue with zyrtec 126mtwice a day. If no hives/itching for 2 weeks then: Decrease zyrtec to 1070mnce a day. Continue Pepcid 69m78mce a day. If you get hives then go back to the dose where you didn't have any symptoms.

## 2022-07-30 NOTE — Progress Notes (Signed)
Pt presents for numbness and tingling in fingers and legs -started about 2 weeks ago - denies any pain

## 2022-07-30 NOTE — Assessment & Plan Note (Addendum)
Past history - Pruritic rash for 1 year lasting 1 day at a time.  No triggers noted.  Occurs a few times per month.  Concerned whether strawberries, bananas and seafood may be contributing to the symptoms.  Saw dermatology virtually in the past with no benefit. 2023 skin prick testing showed: Positive to mold and dog.  Negative to foods. Interim history - 2023 bloodwork slightly positive to pork and lamb, elevated RBC, AP and ESR. Rash much improved after eliminating pork from diet and taking zyrtec and famotidine. Had a flare after eating a beef jerky. No issues with other beef products. . Continue proper skin care. Marland Kitchen Keep track of flares. . Continue to avoid pork, lamb and beef jerky. Faythe Ghee to eat beef but if you notice issues then let us know. . May try strawberry and bananas at home. . Get bloodwork Step down schedule  . Decrease Pepcid to 95m once a day. Continue with zyrtec 169mtwice a day. If no hives/itching for 2 weeks then: . Decrease zyrtec to 1061mnce a day. Continue Pepcid 19m51mce a day. . If you get hives then go back to the dose where you didn't have any symptoms.

## 2022-07-30 NOTE — Patient Instructions (Signed)
Screening for Type 2 Diabetes  A screening test for type 2 diabetes (type 2 diabetes mellitus) is a blood test to measure your blood sugar (glucose) level. This test is done to check for early signs of diabetes, before you develop symptoms.  Type 2 diabetes is a long-term (chronic) disease. In type 2 diabetes, one or both of these problems may be present: The pancreas does not make enough of a hormone called insulin. Cells in the body do not respond properly to insulin that the body makes (insulin resistance). Normally, insulin allows blood sugar (glucose) to enter cells in the body. The cells use glucose for energy. Insulin resistance or lack of insulin causes excess glucose to build up in the blood instead of going into cells. This results in high blood glucose levels (hyperglycemia), which can cause many complications. You may be screened for type 2 diabetes as part of your regular health care, especially if you have a high risk for diabetes. Screening can help to identify type 2 diabetes at its early stage (prediabetes). Identifying and treating prediabetes may delay or prevent the development of type 2 diabetes. Tell a health care provider about: All medicines you are taking, including vitamins, herbs, eye drops, creams, and over-the-counter medicines. Any bleeding problems you have. Any medical conditions you have. Whether you are pregnant or may be pregnant. Who should be screened for type 2 diabetes? Adults Adults age 20 and older. These adults should be screened once every three years. Adults who are any age, are overweight, and have one other risk factor. These adults should be screened once every three years. Adults who have normal blood glucose levels and two or more risk factors. These adults may be screened once every year (annually). Women who have had gestational diabetes in the past. These women should be screened once every three years. Pregnant women who have risk factors. These  women should be screened at their first prenatal visit and again between weeks 24 and 28 of pregnancy. Children and adolescents Children and adolescents should be screened for type 2 diabetes if they are overweight and have any of the following risk factors: A family history of type 2 diabetes. Being a member of a high-risk ethnic group. Signs of insulin resistance or conditions that are associated with insulin resistance. A mother who had gestational diabetes while pregnant. Screening should be done at least once every three years, starting at age 38 or at the onset of puberty, whichever comes first. Your health care provider or your child's health care provider may recommend having a screening more or less often. What are the risk factors for type 2 diabetes? The following are factors that may make you more likely to develop type 2 diabetes and can be modified: Not getting enough exercise. Having high blood pressure. Having low levels of good cholesterol (HDL-C) or high levels of blood fats (triglycerides). Having high blood glucose in a previous blood test. Being overweight or obese. The following are factors that may make you more likely to develop type 2 diabetes and can not be modified: Having a parent or sibling (first-degree relative) who has diabetes. Being of American-Indian, African-American, Hispanic/Latino, Asian, or [place] descent. Being older than age 24. Having a history of diabetes during pregnancy (gestational diabetes). Having certain diseases or conditions that may be caused by insulin resistance, including: Acanthosis nigricans. This is a condition that causes dark skin on the neck, armpits, and groin. Polycystic ovary syndrome (PCOS). Cardiovascular heart disease. What happens  during screening? During screening, your health care provider may ask questions about: Your health and your risk factors, including your activity level and any medical conditions that  you have. The health of your first-degree relatives. Past pregnancies, if this applies. Your health care provider will also do a physical exam, including a blood pressure measurement and blood tests. There are four blood tests that can be used to screen for type 2 diabetes. You may have one or more of the following: A fasting blood glucose (FBG) test. You will not be allowed to eat (you will fast) for 8 hours or more before a blood sample is taken. A random blood glucose test. This test checks your blood glucose at any time of the day regardless of when you ate. An oral glucose tolerance test (OGTT). This test measures your blood glucose at two times: After you have not eaten (have fasted) overnight. This is your baseline glucose level. Two hours after you drink a glucose-containing beverage. An A1C (hemoglobin A1C) blood test. This test provides information about blood glucose control over the previous 2-3 months. What do the results mean? Your test results are a measurement of how much glucose is in your blood. Normal blood glucose levels mean that you do not have diabetes or prediabetes. High blood glucose levels may mean that you have prediabetes or diabetes. Depending on the results, other tests may be needed to confirm the diagnosis. You may be diagnosed with type 2 diabetes if: Your FBG level is 126 mg/dL (7.0 mmol/L) or higher. Your random blood glucose level is 200 mg/dL (11.1 mmol/L) or higher. Your A1C level is 6.5% or higher. Your OGTT result is higher than 200 mg/dL (11.1 mmol/L). These blood tests may be repeated to confirm your diagnosis. Talk with your health care provider about what your results mean. Summary A screening test for type 2 diabetes (type 2 diabetes mellitus) is a blood test to measure your blood sugar (glucose) level. Know what your risk factors are for developing type 2 diabetes. If you are at risk, get screening tests as often as told by your health care  provider. Screening may help you identify type 2 diabetes at its early stage (prediabetes). Identifying and treating prediabetes may delay or prevent the development of type 2 diabetes. This information is not intended to replace advice given to you by your health care provider. Make sure you discuss any questions you have with your health care provider. Document Revised: 01/09/2021 Document Reviewed: 01/09/2021 Elsevier Patient Education  Brianna Gutierrez.

## 2022-07-30 NOTE — Assessment & Plan Note (Signed)
Past history - Dyspnea on exertion and uses albuterol as needed with good benefit.  Has been using it daily since prescribed 1 month ago.  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  CT chest in April 2023 was unremarkable. 2023 spirometry showed restriction with 2% improvement FEV1 postbronchodilator treatment.  Clinically feeling improved. Interim history - still has DOE but didn't require albuterol. . Monitor symptoms. . Only use albuterol if you have symptoms more than 5 minutes after you stop your activities. o If symptoms resolve within a few minutes then shortness of breath most likely due to physical deconditioning. . May use albuterol rescue inhaler 2 puffs every 4 to 6 hours as needed for shortness of breath, chest tightness, coughing, and wheezing.  Monitor frequency of use.

## 2022-07-30 NOTE — Assessment & Plan Note (Signed)
.   Please follow up with your PCP regarding this.

## 2022-07-31 LAB — CBC WITH DIFFERENTIAL/PLATELET
Basophils Absolute: 0 10*3/uL (ref 0.0–0.2)
Basos: 0 %
EOS (ABSOLUTE): 0.1 10*3/uL (ref 0.0–0.4)
Eos: 2 %
Hematocrit: 41.9 % (ref 34.0–46.6)
Hemoglobin: 12.9 g/dL (ref 11.1–15.9)
Immature Grans (Abs): 0 10*3/uL (ref 0.0–0.1)
Immature Granulocytes: 0 %
Lymphocytes Absolute: 1.5 10*3/uL (ref 0.7–3.1)
Lymphs: 19 %
MCH: 22 pg — ABNORMAL LOW (ref 26.6–33.0)
MCHC: 30.8 g/dL — ABNORMAL LOW (ref 31.5–35.7)
MCV: 71 fL — ABNORMAL LOW (ref 79–97)
Monocytes Absolute: 0.7 10*3/uL (ref 0.1–0.9)
Monocytes: 9 %
Neutrophils Absolute: 5.5 10*3/uL (ref 1.4–7.0)
Neutrophils: 70 %
Platelets: 394 10*3/uL (ref 150–450)
RBC: 5.87 x10E6/uL — ABNORMAL HIGH (ref 3.77–5.28)
RDW: 16 % — ABNORMAL HIGH (ref 11.7–15.4)
WBC: 8 10*3/uL (ref 3.4–10.8)

## 2022-07-31 LAB — HEPATIC FUNCTION PANEL
ALT: 15 IU/L (ref 0–32)
AST: 12 IU/L (ref 0–40)
Albumin: 4 g/dL (ref 3.9–4.9)
Alkaline Phosphatase: 132 IU/L — ABNORMAL HIGH (ref 44–121)
Bilirubin Total: <0.2 mg/dL (ref 0.0–1.2)
Bilirubin, Direct: <0.1 mg/dL (ref 0.00–0.40)
Total Protein: 7.4 g/dL (ref 6.0–8.5)

## 2022-07-31 LAB — HEMOGLOBIN A1C
Est. average glucose Bld gHb Est-mCnc: 126 mg/dL
Hgb A1c MFr Bld: 6 % — ABNORMAL HIGH (ref 4.8–5.6)

## 2022-07-31 LAB — SEDIMENTATION RATE: Sed Rate: 65 mm/hr — ABNORMAL HIGH (ref 0–32)

## 2022-08-01 ENCOUNTER — Telehealth (HOSPITAL_COMMUNITY): Payer: Self-pay

## 2022-08-01 NOTE — Telephone Encounter (Signed)
Called patient to make aware of the medication had been prescribed by a different provider she stated that it was not Gabapentin it is the Lexapro. Please advise

## 2022-08-01 NOTE — Telephone Encounter (Signed)
Patient called stating that she is having some side effects from her Gabapentin she is experiencing nausea, lost of appetite, she is shaking she has tried eating before taking the medication she has been taking it in the mornings her last dose was yesterday.

## 2022-08-03 NOTE — Telephone Encounter (Signed)
Have called the patient twice no answer left voicemail for patient to return call to office

## 2022-08-06 ENCOUNTER — Encounter: Payer: Self-pay | Admitting: Allergy

## 2022-08-06 MED ORDER — PREDNISONE 10 MG PO TABS: ORAL_TABLET | ORAL | 0 refills | Status: DC

## 2022-08-10 ENCOUNTER — Telehealth (HOSPITAL_COMMUNITY): Payer: Self-pay | Admitting: *Deleted

## 2022-08-10 NOTE — Telephone Encounter (Signed)
Pt called regarding continuing nausea and "jitters" she feels may be r/t the Lexapro. Writer advised that per Dr. Nelida Gores there were a couple of options ie taking at a different time, taking with meal, or d/c medication altogether until next visit on 08/22/22. Pt choose to discontinue Lexapro. Pt encouraged to call with any further questions or concerns.

## 2022-08-14 DIAGNOSIS — G4733 Obstructive sleep apnea (adult) (pediatric): Secondary | ICD-10-CM | POA: Diagnosis not present

## 2022-08-14 NOTE — Progress Notes (Unsigned)
Follow Up Note  RE: Brianna Gutierrez MRN: [id number] DOB: 1976-08-20 Date of Office Visit: 08/15/2022  Referring provider: [name], NP Primary care provider: [name], NP  Chief Complaint: Urticaria  History of Present Illness: I had the pleasure of seeing Brianna Gutierrez for a follow up visit at the [facility] [place] on 08/15/2022. She is a 46 y.o. female, who is being followed for urticaria, DOE, allergic rhinitis. Her previous allergy office visit was on 07/30/2022 with Dr. [name]. Today is a new complaint visit of hive breakout .  Skin  Currently taking zyrtec 1 tablet BID, famotidine twice a day. Taking benadryl 34m at night to help with sleeping. Hydroxyzine made her too drowsy.  Finished prednisone with no benefit. Patient used Epipen once with some benefit.  Avoiding red meat.  Using things that are for sensitive skin  Usually has itchy skin and once she itches the area - it can turn red and break out in welts.   Assessment and Plan: [name] a 46y.o. female with: Pruritus/urticaria Past history - Pruritic rash for 1 year lasting 1 day at a time.  No triggers noted.  Occurs a few times per month.  Concerned whether strawberries, bananas and seafood may be contributing to the symptoms.  Saw dermatology virtually in the past with no benefit. 2023 skin prick testing showed: Positive to mold and dog.  Negative to foods. 2023 bloodwork slightly positive to pork and lamb, elevated RBC, AP and ESR. Rash much improved after eliminating pork from diet and taking zyrtec and famotidine. Had a flare after eating a beef jerky. No issues with other beef products. Interim history - still having itching on a daily basis. Prednisone taper not helpful. Still elevated RBC, ESR. Continue proper skin care. Keep track of flares. Continue to avoid pork, lamb and beef jerky. Depo 465msteroid injection given today. Start allegra 18083m-2 tablets twice a  day. If symptoms are not controlled or causes drowsiness let us [place]. Start Pepcid (famotidine) 49m62mice a day.  Avoid the following potential triggers: alcohol, tight clothing, NSAIDs, hot showers and getting overheated. Refer to heme/onc for elevated RBCs with pruritus.   Shortness of breath Past history - Dyspnea on exertion and uses albuterol as needed with good benefit.  Has been using it daily since prescribed 1 month ago.  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  CT chest in April 2023 was unremarkable. 2023 spirometry showed restriction with 2% improvement FEV1 postbronchodilator treatment.  Clinically feeling improved. Monitor symptoms. Only use albuterol if you have symptoms more than 5 minutes after you stop your activities. If symptoms resolve within a few minutes then shortness of breath most likely due to physical deconditioning. May use albuterol rescue inhaler 2 puffs every 4 to 6 hours as needed for shortness of breath, chest tightness, coughing, and wheezing.  Monitor frequency of use.   Other allergic rhinitis Past history - Increased rhinitis symptoms recently.  2023 skin prick testing showed: Positive to mold and dog.  Continue environmental control measures.   Return in about 4 weeks (around 09/12/2022).  Meds ordered this encounter  Medications   methylPREDNISolone acetate (DEPO-MEDROL) injection 40 mg   Lab Orders  No laboratory test(s) ordered today    Diagnostics: None.   Medication List:  Current Outpatient Medications  Medication Sig Dispense Refill   amLODipine (NORVASC) 10 MG tablet Take 1 tablet (10 mg total) by  mouth every evening. 30 tablet 2   EPINEPHrine (EPIPEN 2-PAK) 0.3 mg/0.3 mL IJ SOAJ injection Inject 0.3 mg into the muscle as needed for anaphylaxis. (Patient taking differently: Inject 0.3 mg into the muscle once as needed for anaphylaxis.) 2 each 0   famotidine (PEPCID) 20 MG tablet Take 1 tablet (20 mg total)  by mouth 2 (two) times daily. 60 tablet 3   gabapentin (NEURONTIN) 300 MG capsule Take 1 capsule (300 mg total) by mouth at bedtime. 30 capsule 2   hydrochlorothiazide (HYDRODIURIL) 12.5 MG tablet Take 1 tablet (12.5 mg total) by mouth daily. 30 tablet 2   lamoTRIgine (LAMICTAL) 100 MG tablet Take 1 tablet (100 mg total) by mouth daily. 30 tablet 1   metFORMIN (GLUCOPHAGE) 500 MG tablet Take 1 tablet (500 mg total) by mouth 2 (two) times daily with a meal. 60 tablet 2   nicotine (NICODERM CQ - DOSED IN MG/24 HOURS) 14 mg/24hr patch Place 1 patch (14 mg total) onto the skin daily. 28 patch 2   predniSONE (DELTASONE) 10 MG tablet Take prednisone 71m daily x 2 days, 341mdaily x 2 days, 2067maily x 2 days and 52m46mily x 2 days. 20 tablet 0   albuterol (VENTOLIN HFA) 108 (90 Base) MCG/ACT inhaler Inhale 2 puffs into the lungs every 6 (six) hours as needed for wheezing or shortness of breath. 8 g 2   escitalopram (LEXAPRO) 10 MG tablet Take 0.5 tablets (5 mg total) by mouth daily for 14 days, THEN 1 tablet (10 mg total) daily for 16 days. (Patient not taking: Reported on 08/15/2022) 30 tablet 0   sertraline (ZOLOFT) 50 MG tablet Take 1 tablet (50 mg total) by mouth daily. Stop after 14 days 14 tablet 0   No current facility-administered medications for this visit.   Allergies: Allergies  Allergen Reactions   Pork-Derived Products    Tramadol Other (See Comments)    Headache   I reviewed her past medical history, social history, family history, and environmental history and no significant changes have been reported from her previous visit.  Review of Systems  Constitutional:  Negative for appetite change, chills, fever and unexpected weight change.  HENT:  Negative for congestion and rhinorrhea.   Eyes:  Negative for itching.  Respiratory:  Negative for cough, chest tightness, shortness of breath and wheezing.   Cardiovascular:  Negative for chest pain.  Gastrointestinal:  Negative for  abdominal pain.  Genitourinary:  Negative for difficulty urinating.  Skin:  Positive for rash.  Allergic/Immunologic: Positive for environmental allergies.  Neurological:  Negative for headaches.    Objective: BP 120/78   Pulse 97   Temp 98.4 F (36.9 C) (Temporal)   Resp 18   LMP 08/18/2021 (Exact Date)   SpO2 97%  There is no height or weight on file to calculate BMI. Physical Exam Vitals and nursing note reviewed.  Constitutional:      Appearance: Normal appearance. She is well-developed. She is obese.  HENT:     Head: Normocephalic and atraumatic.     Right Ear: Tympanic membrane and external ear normal.     Left Ear: Tympanic membrane and external ear normal.     Nose: Nose normal.     Mouth/Throat:     Mouth: Mucous membranes are moist.     Pharynx: Oropharynx is clear.  Eyes:     Conjunctiva/sclera: Conjunctivae normal.  Cardiovascular:     Rate and Rhythm: Normal rate and regular rhythm.  Heart sounds: Normal heart sounds. No murmur heard.    No friction rub. No gallop.  Pulmonary:     Effort: Pulmonary effort is normal.     Breath sounds: Normal breath sounds. No wheezing, rhonchi or rales.  Musculoskeletal:     Cervical back: Neck supple.  Skin:    General: Skin is warm.     Comments: No hives noted but few areas of erythematous hue on arms b/l.  Neurological:     Mental Status: She is alert and oriented to person, place, and time.  Psychiatric:        Behavior: Behavior normal.    Previous notes and tests were reviewed. The plan was reviewed with the patient/family, and all questions/concerned were addressed.  It was my pleasure to see Analissa today and participate in her care. Please feel free to contact me with any questions or concerns.  Sincerely,  Rexene Alberts, DO Allergy & Immunology  [facility] office: [place] office: [phone]

## 2022-08-15 ENCOUNTER — Encounter: Payer: Self-pay | Admitting: Allergy

## 2022-08-15 ENCOUNTER — Ambulatory Visit (INDEPENDENT_AMBULATORY_CARE_PROVIDER_SITE_OTHER): Payer: BC Managed Care – PPO | Admitting: Allergy

## 2022-08-15 VITALS — BP 120/78 | HR 97 | Temp 98.4°F | Resp 18

## 2022-08-15 DIAGNOSIS — J3089 Other allergic rhinitis: Secondary | ICD-10-CM | POA: Diagnosis not present

## 2022-08-15 DIAGNOSIS — L509 Urticaria, unspecified: Secondary | ICD-10-CM

## 2022-08-15 DIAGNOSIS — R0602 Shortness of breath: Secondary | ICD-10-CM

## 2022-08-15 DIAGNOSIS — L299 Pruritus, unspecified: Secondary | ICD-10-CM

## 2022-08-15 MED ORDER — METHYLPREDNISOLONE ACETATE 40 MG/ML IJ SUSP
40.0000 mg | Freq: Once | INTRAMUSCULAR | Status: AC
  Administered 2022-08-15: 40 mg via INTRAMUSCULAR

## 2022-08-15 NOTE — Patient Instructions (Addendum)
Skin: Continue proper skin care. Keep track of flares. Continue to avoid pork, lamb and beef jerky. Depo '40mg'$  steroid injection given today. Start allegra '180mg'$  1-2 tablets twice a day. If symptoms are not controlled or causes drowsiness let us know. Start pepcid (famotidine) '20mg'$  twice a day.  Avoid the following potential triggers: alcohol, tight clothing, NSAIDs, hot showers and getting overheated.  Will refer to hematology as well.  Environmental allergies 2023 skin testing showed: Positive to mold and dog.  Continue environmental control measures.   Shortness of breath Monitor symptoms. Only use albuterol if you have symptoms more than 5 minutes after your stop your activities. May use albuterol rescue inhaler 2 puffs every 4 to 6 hours as needed for shortness of breath, chest tightness, coughing, and wheezing.  Monitor frequency of use.   Follow up in 1 months or sooner if needed.   Skin care recommendations  Bath time: Always use lukewarm water. AVOID very hot or cold water. Keep bathing time to 5-10 minutes. Do NOT use bubble bath. Use a mild soap and use just enough to wash the dirty areas. Do NOT scrub skin vigorously.  After bathing, pat dry your skin with a towel. Do NOT rub or scrub the skin.  Moisturizers and prescriptions:  ALWAYS apply moisturizers immediately after bathing (within 3 minutes). This helps to lock-in moisture. Use the moisturizer several times a day over the whole body. Good summer moisturizers include: Aveeno, CeraVe, Cetaphil. Good winter moisturizers include: Aquaphor, Vaseline, Cerave, Cetaphil, Eucerin, Vanicream. When using moisturizers along with medications, the moisturizer should be applied about one hour after applying the medication to prevent diluting effect of the medication or moisturize around where you applied the medications. When not using medications, the moisturizer can be continued twice daily as maintenance.  Laundry and  clothing: Avoid laundry products with added color or perfumes. Use unscented hypo-allergenic laundry products such as Tide free, Cheer free & gentle, and All free and clear.  If the skin still seems dry or sensitive, you can try double-rinsing the clothes. Avoid tight or scratchy clothing such as wool. Do not use fabric softeners or dyer sheets.   Mold Control Mold and fungi can grow on a variety of surfaces provided certain temperature and moisture conditions exist.  Outdoor molds grow on plants, decaying vegetation and soil. The major outdoor mold, Alternaria and Cladosporium, are found in very high numbers during hot and dry conditions. Generally, a late summer - fall peak is seen for common outdoor fungal spores. Rain will temporarily lower outdoor mold spore count, but counts rise rapidly when the rainy period ends. The most important indoor molds are Aspergillus and Penicillium. Dark, humid and poorly ventilated basements are ideal sites for mold growth. The next most common sites of mold growth are the bathroom and the kitchen. Outdoor (Seasonal) Mold Control Use air conditioning and keep windows closed. Avoid exposure to decaying vegetation. Avoid leaf raking. Avoid grain handling. Consider wearing a face mask if working in moldy areas.  Indoor (Perennial) Mold Control  Maintain humidity below 50%. Get rid of mold growth on hard surfaces with water, detergent and, if necessary, 5% bleach (do not mix with other cleaners). Then dry the area completely. If mold covers an area more than 10 square feet, consider hiring an indoor environmental professional. For clothing, washing with soap and water is best. If moldy items cannot be cleaned and dried, throw them away. Remove sources e.g. contaminated carpets. Repair and seal leaking roofs or  pipes. Using dehumidifiers in damp basements may be helpful, but empty the water and clean units regularly to prevent mildew from forming. All rooms,  especially basements, bathrooms and kitchens, require ventilation and cleaning to deter mold and mildew growth. Avoid carpeting on concrete or damp floors, and storing items in damp areas. Pet Allergen Avoidance: Contrary to popular opinion, there are no "hypoallergenic" breeds of dogs or cats. That is because people are not allergic to an animal's hair, but to an allergen found in the animal's saliva, dander (dead skin flakes) or urine. Pet allergy symptoms typically occur within minutes. For some people, symptoms can build up and become most severe 8 to 12 hours after contact with the animal. People with severe allergies can experience reactions in public places if dander has been transported on the pet owners' clothing. Keeping an animal outdoors is only a partial solution, since homes with pets in the yard still have higher concentrations of animal allergens. Before getting a pet, ask your allergist to determine if you are allergic to animals. If your pet is already considered part of your family, try to minimize contact and keep the pet out of the bedroom and other rooms where you spend a great deal of time. As with dust mites, vacuum carpets often or replace carpet with a hardwood floor, tile or linoleum. High-efficiency particulate air (HEPA) cleaners can reduce allergen levels over time. While dander and saliva are the source of cat and dog allergens, urine is the source of allergens from rabbits, hamsters, mice and [place] pigs; so ask a non-allergic family member to clean the animal's cage. If you have a pet allergy, talk to your allergist about the potential for allergy immunotherapy (allergy shots). This strategy can often provide long-term relief.

## 2022-08-15 NOTE — Assessment & Plan Note (Signed)
Past history - Dyspnea on exertion and uses albuterol as needed with good benefit.  Has been using it daily since prescribed 1 month ago.  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  CT chest in April 2023 was unremarkable. 2023 spirometry showed restriction with 2% improvement FEV1 postbronchodilator treatment.  Clinically feeling improved. . Monitor symptoms. . Only use albuterol if you have symptoms more than 5 minutes after you stop your activities. o If symptoms resolve within a few minutes then shortness of breath most likely due to physical deconditioning. . May use albuterol rescue inhaler 2 puffs every 4 to 6 hours as needed for shortness of breath, chest tightness, coughing, and wheezing.  Monitor frequency of use.  

## 2022-08-15 NOTE — Progress Notes (Signed)
Please refer to hemetology and oncology for elevrated red blood cells and itching (pruritus) per dr [name]

## 2022-08-15 NOTE — Assessment & Plan Note (Signed)
>>  ASSESSMENT AND PLAN FOR PRURITUS/URTICARIA WRITTEN ON 08/15/2022  1:48 PM BY Garnet Sierras, DO  Past history - Pruritic rash for 1 year lasting 1 day at a time.  No triggers noted.  Occurs a few times per month.  Concerned whether strawberries, bananas and seafood may be contributing to the symptoms.  Saw dermatology virtually in the past with no benefit. 2023 skin prick testing showed: Positive to mold and dog.  Negative to foods. 2023 bloodwork slightly positive to pork and lamb, elevated RBC, AP and ESR. Rash much improved after eliminating pork from diet and taking zyrtec and famotidine. Had a flare after eating a beef jerky. No issues with other beef products. Interim history - still having itching on a daily basis. Prednisone taper not helpful. Still elevated RBC, ESR. Continue proper skin care. Keep track of flares. Continue to avoid pork, lamb and beef jerky. Depo 12m steroid injection given today. Start allegra 1834m1-2 tablets twice a day. If symptoms are not controlled or causes drowsiness let [place]ow. Start Pepcid (famotidine) 2018mwice a day.  Avoid the following potential triggers: alcohol, tight clothing, NSAIDs, hot showers and getting overheated. Refer to heme/onc for elevated RBCs with pruritus.

## 2022-08-15 NOTE — Assessment & Plan Note (Signed)
Past history - Pruritic rash for 1 year lasting 1 day at a time.  No triggers noted.  Occurs a few times per month.  Concerned whether strawberries, bananas and seafood may be contributing to the symptoms.  Saw dermatology virtually in the past with no benefit. 2023 skin prick testing showed: Positive to mold and dog.  Negative to foods. 2023 bloodwork slightly positive to pork and lamb, elevated RBC, AP and ESR. Rash much improved after eliminating pork from diet and taking zyrtec and famotidine. Had a flare after eating a beef jerky. No issues with other beef products. Interim history - still having itching on a daily basis. Prednisone taper not helpful. Still elevated RBC, ESR. Marland Kitchen Continue proper skin care. Marland Kitchen Keep track of flares. . Continue to avoid pork, lamb and beef jerky. . Depo 54m steroid injection given today. . Start allegra 1839m1-2 tablets twice a day. . If symptoms are not controlled or causes drowsiness let usKoreanow. . Start Pepcid (famotidine) 2074mwice a day.  . Avoid the following potential triggers: alcohol, tight clothing, NSAIDs, hot showers and getting overheated. . RMarland Kitchenfer to heme/onc for elevated RBCs with pruritus.

## 2022-08-15 NOTE — Assessment & Plan Note (Signed)
Past history - Increased rhinitis symptoms recently.  2023 skin prick testing showed: Positive to mold and dog.  Continue environmental control measures.  

## 2022-08-20 ENCOUNTER — Encounter: Payer: Self-pay | Admitting: Allergy

## 2022-08-20 NOTE — Telephone Encounter (Signed)
Tried to call patient but left VM. Will reply via mychart.

## 2022-08-21 ENCOUNTER — Emergency Department (HOSPITAL_COMMUNITY): Payer: Self-pay

## 2022-08-21 ENCOUNTER — Ambulatory Visit
Admission: EM | Admit: 2022-08-21 | Discharge: 2022-08-21 | Payer: BC Managed Care – PPO | Attending: Emergency Medicine | Admitting: Emergency Medicine

## 2022-08-21 ENCOUNTER — Emergency Department (HOSPITAL_COMMUNITY)
Admission: EM | Admit: 2022-08-21 | Discharge: 2022-08-21 | Discharge status: Against Medical Advice | Payer: Self-pay | Attending: Emergency Medicine | Admitting: Emergency Medicine

## 2022-08-21 ENCOUNTER — Other Ambulatory Visit: Payer: Self-pay

## 2022-08-21 ENCOUNTER — Encounter (HOSPITAL_COMMUNITY): Payer: Self-pay | Admitting: Emergency Medicine

## 2022-08-21 DIAGNOSIS — R0602 Shortness of breath: Secondary | ICD-10-CM

## 2022-08-21 DIAGNOSIS — R072 Precordial pain: Secondary | ICD-10-CM | POA: Insufficient documentation

## 2022-08-21 DIAGNOSIS — R079 Chest pain, unspecified: Secondary | ICD-10-CM

## 2022-08-21 DIAGNOSIS — Z5321 Procedure and treatment not carried out due to patient leaving prior to being seen by health care provider: Secondary | ICD-10-CM | POA: Insufficient documentation

## 2022-08-21 DIAGNOSIS — R0789 Other chest pain: Secondary | ICD-10-CM | POA: Diagnosis not present

## 2022-08-21 HISTORY — DX: Post-traumatic stress disorder, unspecified: F43.10

## 2022-08-21 LAB — BASIC METABOLIC PANEL
Anion gap: 12 (ref 5–15)
BUN: 13 mg/dL (ref 6–20)
CO2: 23 mmol/L (ref 22–32)
Calcium: 9.5 mg/dL (ref 8.9–10.3)
Chloride: 103 mmol/L (ref 98–111)
Creatinine, Ser: 0.72 mg/dL (ref 0.44–1.00)
GFR, Estimated: >60 mL/min (ref >60)
Glucose, Bld: 75 mg/dL (ref 70–99)
Potassium: 3.9 mmol/L (ref 3.5–5.1)
Sodium: 138 mmol/L (ref 135–145)

## 2022-08-21 LAB — CBC
HCT: 45.3 % (ref 36.0–46.0)
Hemoglobin: 14 g/dL (ref 12.0–15.0)
MCH: 22.5 pg — ABNORMAL LOW (ref 26.0–34.0)
MCHC: 30.9 g/dL (ref 30.0–36.0)
MCV: 72.9 fL — ABNORMAL LOW (ref 80.0–100.0)
Platelets: 385 10*3/uL (ref 150–400)
RBC: 6.21 MIL/uL — ABNORMAL HIGH (ref 3.87–5.11)
RDW: 15.8 % — ABNORMAL HIGH (ref 11.5–15.5)
WBC: 8.5 10*3/uL (ref 4.0–10.5)
nRBC: 0 % (ref 0.0–0.2)

## 2022-08-21 LAB — TROPONIN I (HIGH SENSITIVITY): Troponin I (High Sensitivity): 3 ng/L (ref <18)

## 2022-08-21 LAB — I-STAT BETA HCG BLOOD, ED (MC, WL, AP ONLY): I-stat hCG, quantitative: <5 m[IU]/mL (ref <5)

## 2022-08-21 MED ORDER — FEXOFENADINE HCL 180 MG PO TABS: ORAL_TABLET | ORAL | 2 refills | Status: DC

## 2022-08-21 NOTE — Telephone Encounter (Signed)
Xolair samples have been set aside and her name has been placed on them.

## 2022-08-21 NOTE — Progress Notes (Unsigned)
Follow Up Note  RE: Brianna Gutierrez MRN: [id number] DOB: 11-23-1975 Date of Office Visit: 08/22/2022  Referring provider: [name], NP Primary care provider: [name], NP  Chief Complaint: Urticaria (/)  History of Present Illness: I had the pleasure of seeing Brianna Gutierrez for a follow up visit at the [facility] on 08/23/2022. She is a 46 y.o. female, who is being followed for itching/hives, shortness of breath, allergic rhinitis. Her previous allergy office visit was on 08/15/2022 with Dr. [name]. Today is a new complaint visit of persistent hives/rash .  Pruritus/urticaria Still not doing better and she has been unable to fall sleep.  Yesterday went to the ER because she had some shortness of breath. Work up was negative.   She hasn't heard back about the heme/onc referral. Denies changes in diet, meds, personal care products.  She is trying to quit smoking and using nicotine patch. Denies drinking. No history of any blood disorders.   Component     Latest Ref Rng 08/21/2022  WBC     4.0 - 10.5 K/uL 8.5   RBC     3.87 - 5.11 MIL/uL 6.21 (H)   Hemoglobin     12.0 - 15.0 g/dL 14.0   HCT     36.0 - 46.0 % 45.3   MCV     80.0 - 100.0 fL 72.9 (L)   MCH     26.0 - 34.0 pg 22.5 (L)   MCHC     30.0 - 36.0 g/dL 30.9   RDW     11.5 - 15.5 % 15.8 (H)   Platelets     150 - 400 K/uL 385    Assessment and Plan: Brianna Gutierrez is a 46 y.o. female with: Pruritus/urticaria Past history - Pruritic rash for 1 year lasting 1 day at a time.  No triggers noted.  Occurs a few times per month.  Concerned whether strawberries, bananas and seafood may be contributing to the symptoms.  Saw dermatology virtually in the past with no benefit. 2023 skin prick testing showed: Positive to mold and dog.  Negative to foods. 2023 bloodwork slightly positive to pork and lamb, elevated RBC, AP and ESR. Rash much improved after eliminating pork from diet and taking  zyrtec and famotidine. Had a flare after eating a beef jerky. No issues with other beef products. Interim history - still having itching/rash on a daily basis and unable to sleep. Systemic steroids minimally effective. Discussed with patient that some of the rash she's describing consistent with hives but some are not.  Given prolonged and worsening symptoms will start Xolair 32m every 4 weeks - sample does given today. No additional steroids as ineffective and patient had multiple oral courses and injections.  Continue proper skin care. Keep track of flares. Continue to avoid pork, lamb and beef jerky. Continue allegra 1827m1-2 tablets twice a day. If symptoms are not controlled or causes drowsiness let usKoreanow. Continue Pepcid (famotidine) 2056mwice a day.  Avoid the following potential triggers: alcohol, tight clothing, NSAIDs, hot showers and getting overheated. Referral placed for heme/onc due to persistent elevated RBC in the setting of pruritus/rash.  Okay to take benadryl 51m18m needed for itching. Okay to use calamine lotion.  Get bloodwork.  Other allergic rhinitis Past history - Increased rhinitis symptoms recently.  2023 skin prick testing showed: Positive to mold and dog.  Continue environmental control measures.   Shortness of breath Past  history - Dyspnea on exertion and uses albuterol as needed with good benefit.  Has been using it daily since prescribed 1 month ago.  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  CT chest in April 2023 was unremarkable. 2023 spirometry showed restriction with 2% improvement FEV1 postbronchodilator treatment.  Clinically feeling improved. Monitor symptoms. Only use albuterol if you have symptoms more than 5 minutes after you stop your activities. If symptoms resolve within a few minutes then shortness of breath most likely due to physical deconditioning. May use albuterol rescue inhaler 2 puffs every 4 to 6 hours as  needed for shortness of breath, chest tightness, coughing, and wheezing.  Monitor frequency of use.   Return in about 4 weeks (around 09/19/2022).  Meds ordered this encounter  Medications   omalizumab Brianna Gutierrez) injection 150 mg   Lab Orders         Tryptase         Gelatin,IgE         Latex, IgE         Allergen Gum Carageenan         Allergen, Red (Carmine) Dye, Rf340         IgE Food Prof w/Component Rflx      Diagnostics: None.   Medication List:  Current Outpatient Medications  Medication Sig Dispense Refill   amLODipine (NORVASC) 10 MG tablet Take 1 tablet (10 mg total) by mouth every evening. 30 tablet 2   EPINEPHrine (EPIPEN 2-PAK) 0.3 mg/0.3 mL IJ SOAJ injection Inject 0.3 mg into the muscle as needed for anaphylaxis. (Patient taking differently: Inject 0.3 mg into the muscle once as needed for anaphylaxis.) 2 each 0   famotidine (PEPCID) 20 MG tablet Take 1 tablet (20 mg total) by mouth 2 (two) times daily. 60 tablet 3   fexofenadine (ALLEGRA) 180 MG tablet Take 2 tablets twice a day for hives. 120 tablet 2   gabapentin (NEURONTIN) 300 MG capsule Take 1 capsule (300 mg total) by mouth at bedtime. 30 capsule 2   hydrochlorothiazide (HYDRODIURIL) 12.5 MG tablet Take 1 tablet (12.5 mg total) by mouth daily. 30 tablet 2   lamoTRIgine (LAMICTAL) 100 MG tablet Take 1 tablet (100 mg total) by mouth daily. 30 tablet 1   metFORMIN (GLUCOPHAGE) 500 MG tablet Take 1 tablet (500 mg total) by mouth 2 (two) times daily with a meal. 60 tablet 2   nicotine (NICODERM CQ - DOSED IN MG/24 HOURS) 14 mg/24hr patch Place 1 patch (14 mg total) onto the skin daily. 28 patch 2   albuterol (VENTOLIN HFA) 108 (90 Base) MCG/ACT inhaler Inhale 2 puffs into the lungs every 6 (six) hours as needed for wheezing or shortness of breath. 8 g 2   Current Facility-Administered Medications  Medication Dose Route Frequency Provider Last Rate Last Admin   omalizumab Brianna Gutierrez) injection 150 mg  150 mg  Subcutaneous Q28 days Garnet Sierras, DO   150 mg at 08/22/22 1527   Allergies: Allergies  Allergen Reactions   Pork-Derived Products    Tramadol     rash   Tramadol Other (See Comments)    Headache   I reviewed her past medical history, social history, family history, and environmental history and no significant changes have been reported from her previous visit.  Review of Systems  Constitutional:  Negative for appetite change, chills, fever and unexpected weight change.  HENT:  Negative for congestion and rhinorrhea.   Eyes:  Negative for itching.  Respiratory:  Negative for cough, chest tightness, shortness of breath and wheezing.   Cardiovascular:  Negative for chest pain.  Gastrointestinal:  Negative for abdominal pain.  Genitourinary:  Negative for difficulty urinating.  Skin:  Positive for rash.  Allergic/Immunologic: Positive for environmental allergies.  Neurological:  Negative for headaches.    Objective: BP 126/72   Pulse 80   Resp 20   LMP  (LMP Unknown)  There is no height or weight on file to calculate BMI. Physical Exam Vitals and nursing note reviewed.  Constitutional:      Appearance: Normal appearance. She is well-developed. She is obese.  HENT:     Head: Normocephalic and atraumatic.     Gutierrez Ear: Tympanic membrane and external ear normal.     Left Ear: Tympanic membrane and external ear normal.     Nose: Nose normal.     Mouth/Throat:     Mouth: Mucous membranes are moist.     Pharynx: Oropharynx is clear.  Eyes:     Conjunctiva/sclera: Conjunctivae normal.  Cardiovascular:     Rate and Rhythm: Normal rate and regular rhythm.     Heart sounds: Normal heart sounds. No murmur heard.    No friction rub. No gallop.  Pulmonary:     Effort: Pulmonary effort is normal.     Breath sounds: Normal breath sounds. No wheezing, rhonchi or rales.  Musculoskeletal:     Cervical back: Neck supple.  Skin:    General: Skin is warm.     Findings: Rash present.      Comments: No hives but she has erythematous hue on upper extremities. Gutierrez forearm seems to be more puffy/firm than the left side. She has linear excoriation marks where she was itching the area.   Neurological:     Mental Status: She is alert and oriented to person, place, and time.  Psychiatric:        Behavior: Behavior normal.   Previous notes and tests were reviewed. The plan was reviewed with the patient/family, and all questions/concerned were addressed.  It was my pleasure to see Brianna Gutierrez today and participate in her care. Please feel free to contact me with any questions or concerns.  Sincerely,  Rexene Alberts, DO Allergy & Immunology  [facility] office: [place] office: [phone]

## 2022-08-21 NOTE — Telephone Encounter (Signed)
Patient called back and would like for Dr.Kim to call  her back.   Best contact number: [phone]

## 2022-08-21 NOTE — Addendum Note (Signed)
Addended by: Garnet Sierras on: 08/21/2022 05:00 PM   Modules accepted: Orders

## 2022-08-21 NOTE — Telephone Encounter (Signed)
Called patient.  She is taking '180mg'$  2 pills twice a day and famotidine '20mg'$  twice a day.  Can't afford the allegra. Told her to get generic fexofenadine.  Will try to send in Rx to see if insurance covers it.  Willing to start Xolair - coming in tomorrow at [facility] for follow up and first Xolair shot.

## 2022-08-22 ENCOUNTER — Encounter: Payer: Self-pay | Admitting: Allergy

## 2022-08-22 ENCOUNTER — Ambulatory Visit (INDEPENDENT_AMBULATORY_CARE_PROVIDER_SITE_OTHER): Payer: BC Managed Care – PPO | Admitting: Allergy

## 2022-08-22 ENCOUNTER — Ambulatory Visit (HOSPITAL_COMMUNITY): Payer: BC Managed Care – PPO | Admitting: Psychiatry

## 2022-08-22 VITALS — BP 126/72 | HR 80 | Resp 20

## 2022-08-22 DIAGNOSIS — L299 Pruritus, unspecified: Secondary | ICD-10-CM

## 2022-08-22 DIAGNOSIS — J3089 Other allergic rhinitis: Secondary | ICD-10-CM

## 2022-08-22 DIAGNOSIS — L501 Idiopathic urticaria: Secondary | ICD-10-CM

## 2022-08-22 DIAGNOSIS — L509 Urticaria, unspecified: Secondary | ICD-10-CM

## 2022-08-22 DIAGNOSIS — R0602 Shortness of breath: Secondary | ICD-10-CM | POA: Diagnosis not present

## 2022-08-22 MED ORDER — OMALIZUMAB 150 MG ~~LOC~~ SOLR
150.0000 mg | SUBCUTANEOUS | Status: DC
  Administered 2022-08-22 – 2022-12-04 (×4): 150 mg via SUBCUTANEOUS

## 2022-08-22 NOTE — Patient Instructions (Addendum)
Skin: Continue proper skin care. Keep track of flares. Continue to avoid pork, lamb and beef jerky. Continue allegra '180mg'$  1-2 tablets twice a day. If symptoms are not controlled or causes drowsiness let us know. Continue pepcid (famotidine) '20mg'$  twice a day.  Avoid the following potential triggers: alcohol, tight clothing, NSAIDs, hot showers and getting overheated.  See hematology for your red cells - that sometimes can cause itching.  Okay to take benadryl '25mg'$  as needed for itching. Okay to use calamine lotion.  Xolair '300mg'$  given today - continue every 4 weeks.  Get bloodwork We are ordering labs, so please allow 1-2 weeks for the results to come back. With the newly implemented Cures Act, the labs might be visible to you at the same time that they become visible to me. However, I will not address the results until all of the results are back, so please be patient.  In the meantime, continue recommendations in your patient instructions, including avoidance measures (if applicable), until you hear from me.   Environmental allergies 2023 skin testing showed: Positive to mold and dog.  Continue environmental control measures.   Shortness of breath Monitor symptoms. Only use albuterol if you have symptoms more than 5 minutes after your stop your activities. May use albuterol rescue inhaler 2 puffs every 4 to 6 hours as needed for shortness of breath, chest tightness, coughing, and wheezing.  Monitor frequency of use.   Follow up in 1 months or sooner if needed.   Skin care recommendations  Bath time: Always use lukewarm water. AVOID very hot or cold water. Keep bathing time to 5-10 minutes. Do NOT use bubble bath. Use a mild soap and use just enough to wash the dirty areas. Do NOT scrub skin vigorously.  After bathing, pat dry your skin with a towel. Do NOT rub or scrub the skin.  Moisturizers and prescriptions:  ALWAYS apply moisturizers immediately after bathing (within 3  minutes). This helps to lock-in moisture. Use the moisturizer several times a day over the whole body. Good summer moisturizers include: Aveeno, CeraVe, Cetaphil. Good winter moisturizers include: Aquaphor, Vaseline, Cerave, Cetaphil, Eucerin, Vanicream. When using moisturizers along with medications, the moisturizer should be applied about one hour after applying the medication to prevent diluting effect of the medication or moisturize around where you applied the medications. When not using medications, the moisturizer can be continued twice daily as maintenance.  Laundry and clothing: Avoid laundry products with added color or perfumes. Use unscented hypo-allergenic laundry products such as Tide free, Cheer free & gentle, and All free and clear.  If the skin still seems dry or sensitive, you can try double-rinsing the clothes. Avoid tight or scratchy clothing such as wool. Do not use fabric softeners or dyer sheets.   Mold Control Mold and fungi can grow on a variety of surfaces provided certain temperature and moisture conditions exist.  Outdoor molds grow on plants, decaying vegetation and soil. The major outdoor mold, Alternaria and Cladosporium, are found in very high numbers during hot and dry conditions. Generally, a late summer - fall peak is seen for common outdoor fungal spores. Rain will temporarily lower outdoor mold spore count, but counts rise rapidly when the rainy period ends. The most important indoor molds are Aspergillus and Penicillium. Dark, humid and poorly ventilated basements are ideal sites for mold growth. The next most common sites of mold growth are the bathroom and the kitchen. Outdoor (Seasonal) Mold Control Use air conditioning and keep windows closed.  Avoid exposure to decaying vegetation. Avoid leaf raking. Avoid grain handling. Consider wearing a face mask if working in moldy areas.  Indoor (Perennial) Mold Control  Maintain humidity below 50%. Get rid of  mold growth on hard surfaces with water, detergent and, if necessary, 5% bleach (do not mix with other cleaners). Then dry the area completely. If mold covers an area more than 10 square feet, consider hiring an indoor environmental professional. For clothing, washing with soap and water is best. If moldy items cannot be cleaned and dried, throw them away. Remove sources e.g. contaminated carpets. Repair and seal leaking roofs or pipes. Using dehumidifiers in damp basements may be helpful, but empty the water and clean units regularly to prevent mildew from forming. All rooms, especially basements, bathrooms and kitchens, require ventilation and cleaning to deter mold and mildew growth. Avoid carpeting on concrete or damp floors, and storing items in damp areas. Pet Allergen Avoidance: Contrary to popular opinion, there are no "hypoallergenic" breeds of dogs or cats. That is because people are not allergic to an animal's hair, but to an allergen found in the animal's saliva, dander (dead skin flakes) or urine. Pet allergy symptoms typically occur within minutes. For some people, symptoms can build up and become most severe 8 to 12 hours after contact with the animal. People with severe allergies can experience reactions in public places if dander has been transported on the pet owners' clothing. Keeping an animal outdoors is only a partial solution, since homes with pets in the yard still have higher concentrations of animal allergens. Before getting a pet, ask your allergist to determine if you are allergic to animals. If your pet is already considered part of your family, try to minimize contact and keep the pet out of the bedroom and other rooms where you spend a great deal of time. As with dust mites, vacuum carpets often or replace carpet with a hardwood floor, tile or linoleum. High-efficiency particulate air (HEPA) cleaners can reduce allergen levels over time. While dander and saliva are the source  of cat and dog allergens, urine is the source of allergens from rabbits, hamsters, mice and [place] pigs; so ask a non-allergic family member to clean the animal's cage. If you have a pet allergy, talk to your allergist about the potential for allergy immunotherapy (allergy shots). This strategy can often provide long-term relief.

## 2022-08-23 ENCOUNTER — Telehealth: Payer: Self-pay

## 2022-08-23 ENCOUNTER — Encounter: Payer: Self-pay | Admitting: Allergy

## 2022-08-23 ENCOUNTER — Telehealth: Payer: Self-pay | Admitting: *Deleted

## 2022-08-23 NOTE — Assessment & Plan Note (Signed)
Past history - Pruritic rash for 1 year lasting 1 day at a time.  No triggers noted.  Occurs a few times per month.  Concerned whether strawberries, bananas and seafood may be contributing to the symptoms.  Saw dermatology virtually in the past with no benefit. 2023 skin prick testing showed: Positive to mold and dog.  Negative to foods. 2023 bloodwork slightly positive to pork and lamb, elevated RBC, AP and ESR. Rash much improved after eliminating pork from diet and taking zyrtec and famotidine. Had a flare after eating a beef jerky. No issues with other beef products. Interim history - still having itching/rash on a daily basis and unable to sleep. Systemic steroids minimally effective. . Discussed with patient that some of the rash she's describing consistent with hives but some are not.  . Given prolonged and worsening symptoms will start Xolair 364m every 4 weeks - sample does given today. . No additional steroids as ineffective and patient had multiple oral courses and injections.  . Continue proper skin care. .Marland KitchenKeep track of flares. . Continue to avoid pork, lamb and beef jerky. . Continue allegra 1896m1-2 tablets twice a day. . If symptoms are not controlled or causes drowsiness let usKoreanow. . Continue Pepcid (famotidine) 2057mwice a day.  . Avoid the following potential triggers: alcohol, tight clothing, NSAIDs, hot showers and getting overheated. . RMarland Kitchenferral placed for heme/onc due to persistent elevated RBC in the setting of pruritus/rash.   Okay to take benadryl 81m9m needed for itching.  Okay to use calamine lotion.  . Get bloodwork.

## 2022-08-23 NOTE — Assessment & Plan Note (Signed)
Past history - Dyspnea on exertion and uses albuterol as needed with good benefit.  Has been using it daily since prescribed 1 month ago.  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  CT chest in April 2023 was unremarkable. 2023 spirometry showed restriction with 2% improvement FEV1 postbronchodilator treatment.  Clinically feeling improved. . Monitor symptoms. . Only use albuterol if you have symptoms more than 5 minutes after you stop your activities. o If symptoms resolve within a few minutes then shortness of breath most likely due to physical deconditioning. . May use albuterol rescue inhaler 2 puffs every 4 to 6 hours as needed for shortness of breath, chest tightness, coughing, and wheezing.  Monitor frequency of use.

## 2022-08-23 NOTE — Telephone Encounter (Signed)
-----   Message from Garnet Sierras, DO sent at 08/22/2022  5:28 PM EDT ----- Please place referral for heme/onc - elevated RBC with persistent pruritus/rash. Thank you.

## 2022-08-23 NOTE — Telephone Encounter (Signed)
-----   Message from Garnet Sierras, DO sent at 08/23/2022  1:29 PM EDT ----- Can you start PA for Xolair '300mg'$  every 4 weeks for hives? Thank you.

## 2022-08-23 NOTE — Telephone Encounter (Signed)
Please refer pt per Dr [name] to hematology and oncology for elevated RBC with persistent pruritus/rash  Thank you

## 2022-08-23 NOTE — Assessment & Plan Note (Signed)
Past history - Increased rhinitis symptoms recently.  2023 skin prick testing showed: Positive to mold and dog.  Continue environmental control measures.  

## 2022-08-23 NOTE — Telephone Encounter (Signed)
L/m for patient to contact me to discuss approval, copay card and submit to [employer] for Xolair

## 2022-08-23 NOTE — Assessment & Plan Note (Signed)
>>  ASSESSMENT AND PLAN FOR PRURITUS/URTICARIA WRITTEN ON 08/23/2022  1:35 PM BY Garnet Sierras, DO  Past history - Pruritic rash for 1 year lasting 1 day at a time.  No triggers noted.  Occurs a few times per month.  Concerned whether strawberries, bananas and seafood may be contributing to the symptoms.  Saw dermatology virtually in the past with no benefit. 2023 skin prick testing showed: Positive to mold and dog.  Negative to foods. 2023 bloodwork slightly positive to pork and lamb, elevated RBC, AP and ESR. Rash much improved after eliminating pork from diet and taking zyrtec and famotidine. Had a flare after eating a beef jerky. No issues with other beef products. Interim history - still having itching/rash on a daily basis and unable to sleep. Systemic steroids minimally effective. Discussed with patient that some of the rash she's describing consistent with hives but some are not.  Given prolonged and worsening symptoms will start Xolair 346m every 4 weeks - sample does given today. No additional steroids as ineffective and patient had multiple oral courses and injections.  Continue proper skin care. Keep track of flares. Continue to avoid pork, lamb and beef jerky. Continue allegra 1873m1-2 tablets twice a day. If symptoms are not controlled or causes drowsiness let usKoreanow. Continue Pepcid (famotidine) 2066mwice a day.  Avoid the following potential triggers: alcohol, tight clothing, NSAIDs, hot showers and getting overheated. Referral placed for heme/onc due to persistent elevated RBC in the setting of pruritus/rash.  Okay to take benadryl 49m10m needed for itching. Okay to use calamine lotion.  Get bloodwork.

## 2022-08-24 NOTE — Telephone Encounter (Signed)
Spoke to patient and advised approval, copay card and submit to [facility] for Xolair

## 2022-08-27 ENCOUNTER — Ambulatory Visit (HOSPITAL_COMMUNITY): Payer: BC Managed Care – PPO | Admitting: Psychiatry

## 2022-08-27 ENCOUNTER — Encounter (HOSPITAL_COMMUNITY): Payer: Self-pay

## 2022-08-27 ENCOUNTER — Telehealth: Payer: Self-pay | Admitting: Hematology and Oncology

## 2022-08-27 NOTE — Telephone Encounter (Signed)
Scheduled appt per 10/26 referral. Pt is aware of appt date and time. Pt is aware to arrive 15 mins prior to appt time and to bring and updated insurance card. Pt is aware of appt location.   

## 2022-08-28 ENCOUNTER — Encounter (HOSPITAL_COMMUNITY): Payer: Self-pay | Admitting: Psychiatry

## 2022-08-28 ENCOUNTER — Telehealth (HOSPITAL_BASED_OUTPATIENT_CLINIC_OR_DEPARTMENT_OTHER): Payer: BC Managed Care – PPO | Admitting: Psychiatry

## 2022-08-28 DIAGNOSIS — F4310 Post-traumatic stress disorder, unspecified: Secondary | ICD-10-CM | POA: Diagnosis not present

## 2022-08-28 DIAGNOSIS — F321 Major depressive disorder, single episode, moderate: Secondary | ICD-10-CM | POA: Diagnosis not present

## 2022-08-28 LAB — IGE FOOD PROF W/COMPONENT RFLX
Allergen Corn, IgE: <0.1 kU/L
Clam IgE: <0.1 kU/L
Codfish IgE: <0.1 kU/L
F001-IgE Egg White: <0.1 kU/L
F002-IgE Milk: <0.1 kU/L
Peanut, IgE: <0.1 kU/L
Scallop IgE: <0.1 kU/L
Sesame Seed IgE: <0.1 kU/L
Shrimp IgE: <0.1 kU/L
Soybean IgE: <0.1 kU/L
Walnut IgE: <0.1 kU/L
Wheat IgE: <0.1 kU/L

## 2022-08-28 LAB — ALLERGEN, RED (CARMINE) DYE, RF340: F340-IgE Carmine Red Dye: <0.1 kU/L

## 2022-08-28 LAB — ALLERGEN GUM CARAGEENAN
Carageenan Gum, IgE: <0.35 kU/L (ref <0.35)
Class Interpretation: 0

## 2022-08-28 LAB — C074-IGE GELATIN: C074-IgE Gelatin: <0.1 kU/L

## 2022-08-28 LAB — LATEX, IGE: Latex IgE: <0.1 kU/L

## 2022-08-28 LAB — TRYPTASE: Tryptase: 7.1 ug/L (ref 2.2–13.2)

## 2022-08-28 MED ORDER — LAMOTRIGINE 100 MG PO TABS: 100.0000 mg | ORAL_TABLET | Freq: Every day | ORAL | 1 refills | Status: DC
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
## 2022-08-31 NOTE — Telephone Encounter (Signed)
This was done in another telephone contact.

## 2022-09-05 ENCOUNTER — Encounter: Payer: Self-pay | Admitting: Allergy

## 2022-09-06 DIAGNOSIS — F4310 Post-traumatic stress disorder, unspecified: Secondary | ICD-10-CM | POA: Diagnosis not present

## 2022-09-13 ENCOUNTER — Other Ambulatory Visit: Payer: BC Managed Care – PPO

## 2022-09-13 ENCOUNTER — Encounter: Payer: BC Managed Care – PPO | Admitting: Hematology and Oncology

## 2022-09-14 DIAGNOSIS — G4733 Obstructive sleep apnea (adult) (pediatric): Secondary | ICD-10-CM | POA: Diagnosis not present

## 2022-09-17 ENCOUNTER — Ambulatory Visit: Payer: BC Managed Care – PPO | Admitting: Allergy

## 2022-09-18 NOTE — Progress Notes (Unsigned)
Follow Up Note  RE: Brianna Gutierrez MRN: [id number] DOB: 1975-12-15 Date of Office Visit: 09/19/2022  Referring provider: [name], NP Primary care provider: [name], NP  Chief Complaint: Urticaria  History of Present Illness: I had the pleasure of seeing Brianna Gutierrez for a follow up visit at [facility] on 09/19/2022. She is a 46 y.o. female, who is being followed for pruritus/urticaria, allergic rhinitis and shortness of breath. Her previous allergy office visit was on 08/22/2022 with Dr. [name]. Today is a regular follow up visit.  Pruritus/urticaria Patient is about 80% improved with her symptoms after first Xolair injection. No issues with the shot.   Patient has been off metformin and she wants to try it again to see if it contributed to her itching/rash.  She was taking metformin for prediabetes.  Avoiding red meat now as well.  Taking famotidine 60m BID, allegra 1828mBID and benadryl 2557mt night.   Patient has not seen hematology yet.    Shortness of breath Using albuterol with going up the stairs. Discussed physical deconditioning.   Assessment and Plan: LatKatriana a 46 9o. female with: Pruritus/urticaria Past history - Pruritic rash for 1 year lasting 1 day at a time.  No triggers noted.  Occurs a few times per month.  Concerned whether strawberries, bananas and seafood may be contributing to the symptoms.  Saw dermatology virtually in the past with no benefit. 2023 skin prick testing showed: Positive to mold and dog.  Negative to foods. 2023 bloodwork slightly positive to pork and lamb, elevated RBC, AP and ESR. Rash much improved after eliminating pork from diet and taking zyrtec and famotidine. Had a flare after eating a beef jerky. No issues with other beef products. Interim history - 80% improved after first Xolair injection. Rest of bloodwork normal. No trigger found.  Continue proper skin care. Keep track of  flares. Continue to avoid pork, lamb and beef jerky. Continue allegra 180m30m2 tablets twice a day. If symptoms are not controlled or causes drowsiness let us [place]. Continue Pepcid (famotidine) 20mg75mce a day.  Avoid the following potential triggers: alcohol, tight clothing, NSAIDs, hot showers and getting overheated. Hematology for elevated RBC - that sometimes can cause itching. Okay to take benadryl 25mg 45meeded for itching. Okay to use calamine lotion.  Xolair 300mg g65m today - continue every 4 weeks.  Other adverse food reactions, not elsewhere classified, subsequent encounter Past history - 2023 bloodwork positive to pork and lamb. Continue to avoid red meat.  Other allergic rhinitis Past history - Increased rhinitis symptoms recently.  2023 skin prick testing showed: Positive to mold and dog.  Continue environmental control measures.   Shortness of breath Past history - Dyspnea on exertion and uses albuterol as needed with good benefit.  Has been using it daily since prescribed 1 month ago.  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  CT chest in April 2023 was unremarkable. 2023 spirometry showed restriction with 2% improvement FEV1 postbronchodilator treatment.  Clinically feeling improved. Monitor symptoms. Only use albuterol if you have symptoms more than 5 minutes after your stop your activities. DOE is most likely due to physical deconditioning.  May use albuterol rescue inhaler 2 puffs every 4 to 6 hours as needed for shortness of breath, chest tightness, coughing, and wheezing.  Monitor frequency of use.   Return in about 3 months (around 12/20/2022).  No orders  of the defined types were placed in this encounter.  Lab Orders  No laboratory test(s) ordered today    Diagnostics: None.   Medication List:  Current Outpatient Medications  Medication Sig Dispense Refill   albuterol (VENTOLIN HFA) 108 (90 Base) MCG/ACT inhaler Inhale 2  puffs into the lungs every 6 (six) hours as needed for wheezing or shortness of breath. 8 g 2   amLODipine (NORVASC) 10 MG tablet Take 1 tablet (10 mg total) by mouth every evening. 30 tablet 2   EPINEPHrine (EPIPEN 2-PAK) 0.3 mg/0.3 mL IJ SOAJ injection Inject 0.3 mg into the muscle as needed for anaphylaxis. (Patient taking differently: Inject 0.3 mg into the muscle once as needed for anaphylaxis.) 2 each 0   famotidine (PEPCID) 20 MG tablet Take 1 tablet (20 mg total) by mouth 2 (two) times daily. 60 tablet 3   fexofenadine (ALLEGRA) 180 MG tablet Take 2 tablets twice a day for hives. 120 tablet 2   gabapentin (NEURONTIN) 300 MG capsule Take 1 capsule (300 mg total) by mouth at bedtime. 30 capsule 2   hydrochlorothiazide (HYDRODIURIL) 12.5 MG tablet Take 1 tablet (12.5 mg total) by mouth daily. 30 tablet 2   lamoTRIgine (LAMICTAL) 100 MG tablet Take 1 tablet (100 mg total) by mouth daily. 30 tablet 1   metFORMIN (GLUCOPHAGE) 500 MG tablet Take 1 tablet (500 mg total) by mouth 2 (two) times daily with a meal. 60 tablet 2   nicotine (NICODERM CQ - DOSED IN MG/24 HOURS) 14 mg/24hr patch Place 1 patch (14 mg total) onto the skin daily. 28 patch 2   XOLAIR 150 MG/ML prefilled syringe Inject 300 mg into the skin every 28 (twenty-eight) days.     Current Facility-Administered Medications  Medication Dose Route Frequency Provider Last Rate Last Admin   omalizumab Arvid Right) injection 150 mg  150 mg Subcutaneous Q28 days Garnet Sierras, DO   150 mg at 09/19/22 1528   Allergies: Allergies  Allergen Reactions   Pork-Derived Products    Tramadol     rash   Tramadol Other (See Comments)    Headache   I reviewed her past medical history, social history, family history, and environmental history and no significant changes have been reported from her previous visit.  Review of Systems  Constitutional:  Negative for appetite change, chills, fever and unexpected weight change.  HENT:  Negative for  congestion and rhinorrhea.   Eyes:  Negative for itching.  Respiratory:  Negative for cough, chest tightness, shortness of breath and wheezing.   Cardiovascular:  Negative for chest pain.  Gastrointestinal:  Negative for abdominal pain.  Genitourinary:  Negative for difficulty urinating.  Skin:  Positive for rash.  Allergic/Immunologic: Positive for environmental allergies.  Neurological:  Negative for headaches.    Objective: BP 138/82   Pulse 85   Temp 97.7 F (36.5 C) (Temporal)   Resp 16   Ht _0  (1.575 m)   Wt 195 lb 3.2 oz (88.5 kg)   LMP  (LMP Unknown)   SpO2 95%   BMI 35.70 kg/m  Body mass index is 35.7 kg/m. Physical Exam Vitals and nursing note reviewed.  Constitutional:      Appearance: Normal appearance. She is well-developed. She is obese.  HENT:     Head: Normocephalic and atraumatic.     Right Ear: Tympanic membrane and external ear normal.     Left Ear: Tympanic membrane and external ear normal.     Nose: Nose normal.  Mouth/Throat:     Mouth: Mucous membranes are moist.     Pharynx: Oropharynx is clear.  Eyes:     Conjunctiva/sclera: Conjunctivae normal.  Cardiovascular:     Rate and Rhythm: Normal rate and regular rhythm.     Heart sounds: Normal heart sounds. No murmur heard.    No friction rub. No gallop.  Pulmonary:     Effort: Pulmonary effort is normal.     Breath sounds: Normal breath sounds. No wheezing, rhonchi or rales.  Musculoskeletal:     Cervical back: Neck supple.  Skin:    General: Skin is warm.     Findings: No rash.  Neurological:     Mental Status: She is alert and oriented to person, place, and time.  Psychiatric:        Behavior: Behavior normal.    Previous notes and tests were reviewed. The plan was reviewed with the patient/family, and all questions/concerned were addressed.  It was my pleasure to see Brianna Gutierrez today and participate in her care. Please feel free to contact me with any questions or  concerns.  Sincerely,  Rexene Alberts, DO Allergy & Immunology  [facility] office: [place] office: [phone]

## 2022-09-19 ENCOUNTER — Ambulatory Visit (INDEPENDENT_AMBULATORY_CARE_PROVIDER_SITE_OTHER): Payer: BC Managed Care – PPO | Admitting: Allergy

## 2022-09-19 ENCOUNTER — Encounter: Payer: Self-pay | Admitting: Allergy

## 2022-09-19 ENCOUNTER — Other Ambulatory Visit: Payer: Self-pay

## 2022-09-19 ENCOUNTER — Ambulatory Visit: Payer: BC Managed Care – PPO

## 2022-09-19 VITALS — BP 138/82 | HR 85 | Temp 97.7°F | Resp 16 | Ht 62.0 in | Wt 195.2 lb

## 2022-09-19 DIAGNOSIS — T781XXD Other adverse food reactions, not elsewhere classified, subsequent encounter: Secondary | ICD-10-CM

## 2022-09-19 DIAGNOSIS — L501 Idiopathic urticaria: Secondary | ICD-10-CM | POA: Diagnosis not present

## 2022-09-19 DIAGNOSIS — L299 Pruritus, unspecified: Secondary | ICD-10-CM

## 2022-09-19 DIAGNOSIS — J3089 Other allergic rhinitis: Secondary | ICD-10-CM

## 2022-09-19 DIAGNOSIS — R0602 Shortness of breath: Secondary | ICD-10-CM

## 2022-09-19 DIAGNOSIS — T7819XD Other adverse food reactions, not elsewhere classified, subsequent encounter: Secondary | ICD-10-CM

## 2022-09-19 DIAGNOSIS — L509 Urticaria, unspecified: Secondary | ICD-10-CM

## 2022-09-19 NOTE — Patient Instructions (Addendum)
Skin: Continue proper skin care. Keep track of flares. Continue to avoid pork, lamb and beef jerky. Continue allegra '180mg'$  1-2 tablets twice a day. If symptoms are not controlled or causes drowsiness let us know. Continue pepcid (famotidine) '20mg'$  twice a day.  Avoid the following potential triggers: alcohol, tight clothing, NSAIDs, hot showers and getting overheated.  See hematology for your red cells - that sometimes can cause itching.  Okay to take benadryl '25mg'$  as needed for itching. Okay to use calamine lotion.  Xolair '300mg'$  given today - continue every 4 weeks.   Environmental allergies 2023 skin testing showed: Positive to mold and dog.  Continue environmental control measures.   Shortness of breath Monitor symptoms. Only use albuterol if you have symptoms more than 5 minutes after your stop your activities. May use albuterol rescue inhaler 2 puffs every 4 to 6 hours as needed for shortness of breath, chest tightness, coughing, and wheezing.  Monitor frequency of use.   Follow up in 3 months or sooner if needed.   Skin care recommendations  Bath time: Always use lukewarm water. AVOID very hot or cold water. Keep bathing time to 5-10 minutes. Do NOT use bubble bath. Use a mild soap and use just enough to wash the dirty areas. Do NOT scrub skin vigorously.  After bathing, pat dry your skin with a towel. Do NOT rub or scrub the skin.  Moisturizers and prescriptions:  ALWAYS apply moisturizers immediately after bathing (within 3 minutes). This helps to lock-in moisture. Use the moisturizer several times a day over the whole body. Good summer moisturizers include: Aveeno, CeraVe, Cetaphil. Good winter moisturizers include: Aquaphor, Vaseline, Cerave, Cetaphil, Eucerin, Vanicream. When using moisturizers along with medications, the moisturizer should be applied about one hour after applying the medication to prevent diluting effect of the medication or moisturize around  where you applied the medications. When not using medications, the moisturizer can be continued twice daily as maintenance.  Laundry and clothing: Avoid laundry products with added color or perfumes. Use unscented hypo-allergenic laundry products such as Tide free, Cheer free & gentle, and All free and clear.  If the skin still seems dry or sensitive, you can try double-rinsing the clothes. Avoid tight or scratchy clothing such as wool. Do not use fabric softeners or dyer sheets.   Mold Control Mold and fungi can grow on a variety of surfaces provided certain temperature and moisture conditions exist.  Outdoor molds grow on plants, decaying vegetation and soil. The major outdoor mold, Alternaria and Cladosporium, are found in very high numbers during hot and dry conditions. Generally, a late summer - fall peak is seen for common outdoor fungal spores. Rain will temporarily lower outdoor mold spore count, but counts rise rapidly when the rainy period ends. The most important indoor molds are Aspergillus and Penicillium. Dark, humid and poorly ventilated basements are ideal sites for mold growth. The next most common sites of mold growth are the bathroom and the kitchen. Outdoor (Seasonal) Mold Control Use air conditioning and keep windows closed. Avoid exposure to decaying vegetation. Avoid leaf raking. Avoid grain handling. Consider wearing a face mask if working in moldy areas.  Indoor (Perennial) Mold Control  Maintain humidity below 50%. Get rid of mold growth on hard surfaces with water, detergent and, if necessary, 5% bleach (do not mix with other cleaners). Then dry the area completely. If mold covers an area more than 10 square feet, consider hiring an indoor environmental professional. For clothing, washing with soap  and water is best. If moldy items cannot be cleaned and dried, throw them away. Remove sources e.g. contaminated carpets. Repair and seal leaking roofs or pipes. Using  dehumidifiers in damp basements may be helpful, but empty the water and clean units regularly to prevent mildew from forming. All rooms, especially basements, bathrooms and kitchens, require ventilation and cleaning to deter mold and mildew growth. Avoid carpeting on concrete or damp floors, and storing items in damp areas. Pet Allergen Avoidance: Contrary to popular opinion, there are no "hypoallergenic" breeds of dogs or cats. That is because people are not allergic to an animal's hair, but to an allergen found in the animal's saliva, dander (dead skin flakes) or urine. Pet allergy symptoms typically occur within minutes. For some people, symptoms can build up and become most severe 8 to 12 hours after contact with the animal. People with severe allergies can experience reactions in public places if dander has been transported on the pet owners' clothing. Keeping an animal outdoors is only a partial solution, since homes with pets in the yard still have higher concentrations of animal allergens. Before getting a pet, ask your allergist to determine if you are allergic to animals. If your pet is already considered part of your family, try to minimize contact and keep the pet out of the bedroom and other rooms where you spend a great deal of time. As with dust mites, vacuum carpets often or replace carpet with a hardwood floor, tile or linoleum. High-efficiency particulate air (HEPA) cleaners can reduce allergen levels over time. While dander and saliva are the source of cat and dog allergens, urine is the source of allergens from rabbits, hamsters, mice and [place] pigs; so ask a non-allergic family member to clean the animal's cage. If you have a pet allergy, talk to your allergist about the potential for allergy immunotherapy (allergy shots). This strategy can often provide long-term relief.

## 2022-09-19 NOTE — Assessment & Plan Note (Signed)
Past history - 2023 bloodwork positive to pork and lamb. Continue to avoid red meat. 

## 2022-09-19 NOTE — Assessment & Plan Note (Signed)
Past history - Pruritic rash for 1 year lasting 1 day at a time.  No triggers noted.  Occurs a few times per month.  Concerned whether strawberries, bananas and seafood may be contributing to the symptoms.  Saw dermatology virtually in the past with no benefit. 2023 skin prick testing showed: Positive to mold and dog.  Negative to foods. 2023 bloodwork slightly positive to pork and lamb, elevated RBC, AP and ESR. Rash much improved after eliminating pork from diet and taking zyrtec and famotidine. Had a flare after eating a beef jerky. No issues with other beef products. Interim history - 80% improved after first Xolair injection. Rest of bloodwork normal. No trigger found.  Continue proper skin care. Keep track of flares. Continue to avoid pork, lamb and beef jerky. Continue allegra 121m 1-2 tablets twice a day. If symptoms are not controlled or causes drowsiness let [place]. Continue Pepcid (famotidine) 284mtwice a day.  Avoid the following potential triggers: alcohol, tight clothing, NSAIDs, hot showers and getting overheated. Hematology for elevated RBC - that sometimes can cause itching. Okay to take benadryl 2530ms needed for itching. Okay to use calamine lotion.  Xolair 300m28mven today - continue every 4 weeks.

## 2022-09-19 NOTE — Assessment & Plan Note (Signed)
>>  ASSESSMENT AND PLAN FOR PRURITUS/URTICARIA WRITTEN ON 09/19/2022  3:51 PM BY Garnet Sierras, DO  Past history - Pruritic rash for 1 year lasting 1 day at a time.  No triggers noted.  Occurs a few times per month.  Concerned whether strawberries, bananas and seafood may be contributing to the symptoms.  Saw dermatology virtually in the past with no benefit. 2023 skin prick testing showed: Positive to mold and dog.  Negative to foods. 2023 bloodwork slightly positive to pork and lamb, elevated RBC, AP and ESR. Rash much improved after eliminating pork from diet and taking zyrtec and famotidine. Had a flare after eating a beef jerky. No issues with other beef products. Interim history - 80% improved after first Xolair injection. Rest of bloodwork normal. No trigger found.  Continue proper skin care. Keep track of flares. Continue to avoid pork, lamb and beef jerky. Continue allegra 152m 1-2 tablets twice a day. If symptoms are not controlled or causes drowsiness let [place]. Continue Pepcid (famotidine) 271mtwice a day.  Avoid the following potential triggers: alcohol, tight clothing, NSAIDs, hot showers and getting overheated. Hematology for elevated RBC - that sometimes can cause itching. Okay to take benadryl 2555ms needed for itching. Okay to use calamine lotion.  Xolair 300m32mven today - continue every 4 weeks.

## 2022-09-19 NOTE — Assessment & Plan Note (Signed)
Past history - Dyspnea on exertion and uses albuterol as needed with good benefit.  Has been using it daily since prescribed 1 month ago.  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  CT chest in April 2023 was unremarkable. 2023 spirometry showed restriction with 2% improvement FEV1 postbronchodilator treatment.  Clinically feeling improved. Monitor symptoms. Only use albuterol if you have symptoms more than 5 minutes after your stop your activities. DOE is most likely due to physical deconditioning.  May use albuterol rescue inhaler 2 puffs every 4 to 6 hours as needed for shortness of breath, chest tightness, coughing, and wheezing.  Monitor frequency of use.

## 2022-09-19 NOTE — Assessment & Plan Note (Signed)
Past history - Increased rhinitis symptoms recently.  2023 skin prick testing showed: Positive to mold and dog.  Continue environmental control measures.  

## 2022-09-27 ENCOUNTER — Encounter (HOSPITAL_COMMUNITY): Payer: Self-pay | Admitting: Psychiatry

## 2022-09-27 ENCOUNTER — Telehealth (HOSPITAL_BASED_OUTPATIENT_CLINIC_OR_DEPARTMENT_OTHER): Payer: BC Managed Care – PPO | Admitting: Psychiatry

## 2022-09-27 DIAGNOSIS — F321 Major depressive disorder, single episode, moderate: Secondary | ICD-10-CM | POA: Diagnosis not present

## 2022-09-27 DIAGNOSIS — F4310 Post-traumatic stress disorder, unspecified: Secondary | ICD-10-CM

## 2022-09-27 MED ORDER — TRAZODONE HCL 100 MG PO TABS: 100.0000 mg | ORAL_TABLET | Freq: Every day | ORAL | 2 refills | Status: DC
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
## 2022-10-14 DIAGNOSIS — G4733 Obstructive sleep apnea (adult) (pediatric): Secondary | ICD-10-CM | POA: Diagnosis not present

## 2022-10-18 ENCOUNTER — Telehealth (HOSPITAL_COMMUNITY): Payer: BC Managed Care – PPO | Admitting: Psychiatry

## 2022-10-18 ENCOUNTER — Ambulatory Visit (INDEPENDENT_AMBULATORY_CARE_PROVIDER_SITE_OTHER): Payer: BC Managed Care – PPO

## 2022-10-18 DIAGNOSIS — L509 Urticaria, unspecified: Secondary | ICD-10-CM

## 2022-11-12 ENCOUNTER — Ambulatory Visit: Payer: BC Managed Care – PPO | Admitting: Allergy

## 2022-11-14 DIAGNOSIS — G4733 Obstructive sleep apnea (adult) (pediatric): Secondary | ICD-10-CM | POA: Diagnosis not present

## 2022-11-15 ENCOUNTER — Other Ambulatory Visit: Payer: Self-pay

## 2022-11-15 ENCOUNTER — Emergency Department (HOSPITAL_COMMUNITY)
Admission: EM | Admit: 2022-11-15 | Discharge: 2022-11-15 | Disposition: A | Discharge status: Home / Self Care | Payer: BC Managed Care – PPO | Attending: Emergency Medicine | Admitting: Emergency Medicine

## 2022-11-15 ENCOUNTER — Encounter (HOSPITAL_COMMUNITY): Payer: Self-pay

## 2022-11-15 ENCOUNTER — Ambulatory Visit: Payer: BC Managed Care – PPO

## 2022-11-15 DIAGNOSIS — U071 COVID-19: Secondary | ICD-10-CM | POA: Diagnosis not present

## 2022-11-15 DIAGNOSIS — M545 Low back pain, unspecified: Secondary | ICD-10-CM | POA: Diagnosis not present

## 2022-11-15 DIAGNOSIS — M549 Dorsalgia, unspecified: Secondary | ICD-10-CM | POA: Diagnosis not present

## 2022-11-15 DIAGNOSIS — R509 Fever, unspecified: Secondary | ICD-10-CM | POA: Diagnosis not present

## 2022-11-15 DIAGNOSIS — I10 Essential (primary) hypertension: Secondary | ICD-10-CM | POA: Diagnosis not present

## 2022-11-15 DIAGNOSIS — R059 Cough, unspecified: Secondary | ICD-10-CM | POA: Diagnosis not present

## 2022-11-15 LAB — RESP PANEL BY RT-PCR (RSV, FLU A&B, COVID)  RVPGX2
Influenza A by PCR: NEGATIVE
Influenza B by PCR: NEGATIVE
Resp Syncytial Virus by PCR: NEGATIVE
SARS Coronavirus 2 by RT PCR: POSITIVE — AB

## 2022-11-15 MED ORDER — IBUPROFEN 200 MG PO TABS: 600.0000 mg | ORAL_TABLET | Freq: Once | ORAL | Status: DC

## 2022-11-15 MED ORDER — ONDANSETRON 4 MG PO TBDP: 4.0000 mg | ORAL_TABLET | Freq: Three times a day (TID) | ORAL | 0 refills | Status: DC | PRN

## 2022-11-15 MED ORDER — ACETAMINOPHEN 500 MG PO TABS: 1000.0000 mg | ORAL_TABLET | Freq: Four times a day (QID) | ORAL | 0 refills | Status: DC | PRN

## 2022-11-15 MED ORDER — IBUPROFEN 600 MG PO TABS: 600.0000 mg | ORAL_TABLET | Freq: Four times a day (QID) | ORAL | 0 refills | Status: DC | PRN

## 2022-11-15 NOTE — Discharge Instructions (Addendum)
Viral Illness TREATMENT   YOU TESTED POSITIVE FOR COVID 19  Treatment is directed at relieving symptoms. There is no cure. Antibiotics are not effective, because the infection is caused by a virus, not by bacteria. Treatment may include:  Increased fluid intake. Sports drinks offer valuable electrolytes, sugars, and fluids.  Breathing heated mist or steam (vaporizer or shower).  Eating chicken soup or other clear broths, and maintaining good nutrition.  Getting plenty of rest.  Using gargles or lozenges for comfort.  Increasing usage of your inhaler if you have asthma.  Return to work when your temperature has returned to normal.  Gargle warm salt water and spit it out for sore throat. Take benadryl to decrease sinus secretions. Continue to alternate between Tylenol and ibuprofen for pain and fever control.  Follow Up: Follow up with your primary care doctor in 5-7 days for recheck of ongoing symptoms.  Return to emergency department for emergent changing or worsening of symptoms.

## 2022-11-15 NOTE — ED Notes (Signed)
Pt given discharge instructions and wheeled out prior to order for Ibuprofen being placed.

## 2022-11-15 NOTE — ED Provider Notes (Signed)
[facility] DEPT Provider Note   CSN: [id number] Arrival date & time: 11/15/22  2134     History  Chief Complaint  Patient presents with   Cough   Shortness of Breath   Generalized Body Aches    Brianna Gutierrez is a 47 y.o. female.   Cough Associated symptoms: shortness of breath   Shortness of Breath Associated symptoms: cough    Patient is a 47 year old female present emergency room today with complaints of cough congestion runny nose body aches fatigue generalized weakness for the past 2 or 3 days.  She denies any chest pain no vomiting no syncope.  No unilateral leg swelling or hemoptysis.  No other associate symptoms.      Home Medications Prior to Admission medications   Medication Sig Start Date End Date Taking? Authorizing Provider  acetaminophen (TYLENOL) 500 MG tablet Take 2 tablets (1,000 mg total) by mouth every 6 (six) hours as needed. 11/15/22  Yes Saavi Mceachron S, PA  ibuprofen (ADVIL) 600 MG tablet Take 1 tablet (600 mg total) by mouth every 6 (six) hours as needed. 11/15/22  Yes Nour Rodrigues S, PA  ondansetron (ZOFRAN-ODT) 4 MG disintegrating tablet Take 1 tablet (4 mg total) by mouth every 8 (eight) hours as needed for nausea or vomiting. 11/15/22  Yes Nelli Swalley S, PA  albuterol (VENTOLIN HFA) 108 (90 Base) MCG/ACT inhaler Inhale 2 puffs into the lungs every 6 (six) hours as needed for wheezing or shortness of breath. 02/14/22   Cobb, Karie Schwalbe, NP  amLODipine (NORVASC) 10 MG tablet Take 1 tablet (10 mg total) by mouth every evening. 07/30/22 10/28/22  [name], NP  EPINEPHrine (EPIPEN 2-PAK) 0.3 mg/0.3 mL IJ SOAJ injection Inject 0.3 mg into the muscle as needed for anaphylaxis. Patient taking differently: Inject 0.3 mg into the muscle once as needed for anaphylaxis. 03/15/22   [name], NP  famotidine (PEPCID) 20 MG tablet Take 1 tablet (20 mg total) by mouth 2 (two) times daily. 04/18/22   Garnet Sierras,  DO  fexofenadine (ALLEGRA) 180 MG tablet Take 2 tablets twice a day for hives. 08/21/22   Garnet Sierras, DO  gabapentin (NEURONTIN) 300 MG capsule Take 1 capsule (300 mg total) by mouth at bedtime. 07/30/22   [name], NP  hydrochlorothiazide (HYDRODIURIL) 12.5 MG tablet Take 1 tablet (12.5 mg total) by mouth daily. 07/30/22 10/28/22  [name], NP  lamoTRIgine (LAMICTAL) 100 MG tablet Take 1 tablet (100 mg total) by mouth daily. 08/28/22 08/28/23  Vista Mink, MD  metFORMIN (GLUCOPHAGE) 500 MG tablet Take 1 tablet (500 mg total) by mouth 2 (two) times daily with a meal. 07/30/22 10/28/22  [name], NP  traZODone (DESYREL) 100 MG tablet Take 1 tablet (100 mg total) by mouth at bedtime. 09/27/22   Vista Mink, MD  Arvid Right 150 MG/ML prefilled syringe Inject 300 mg into the skin every 28 (twenty-eight) days. 08/27/22   [provider]  dicyclomine (BENTYL) 20 MG tablet Take 1 tablet (20 mg total) by mouth 2 (two) times daily. 10/29/19 08/24/20  Ok Edwards, PA-C  omeprazole (PRILOSEC) 20 MG capsule Take 1 capsule (20 mg total) by mouth daily. 01/01/19 08/07/19  Scot Jun, NP      Allergies    Pork-derived products, Tramadol, and Tramadol    Review of Systems   Review of Systems  Respiratory:  Positive for cough and shortness of breath.  Physical Exam Updated Vital Signs BP (!) 158/100 (BP Location: Left Arm)   Pulse 86   Temp 98.5 F (36.9 C) (Oral)   Resp 18   Ht '5\' 2"'$  (1.575 m)   Wt 77.1 kg   LMP  (LMP Unknown)   SpO2 93%   BMI 31.09 kg/m  Physical Exam Vitals and nursing note reviewed.  Constitutional:      General: She is not in acute distress.    Comments: Pleasant well-appearing 47 year old.  In no acute distress.  Sitting comfortably in bed.  Able answer questions appropriately follow commands. No increased work of breathing. Speaking in full sentences.   HENT:     Head: Normocephalic and atraumatic.     Nose: Nose normal.      Mouth/Throat:     Mouth: Mucous membranes are moist.  Eyes:     General: No scleral icterus. Cardiovascular:     Rate and Rhythm: Normal rate and regular rhythm.     Pulses: Normal pulses.     Heart sounds: Normal heart sounds.  Pulmonary:     Effort: Pulmonary effort is normal. No respiratory distress.     Breath sounds: Normal breath sounds. No wheezing.     Comments: No tachypnea, speaks in full sentences  Abdominal:     Palpations: Abdomen is soft.     Tenderness: There is no abdominal tenderness. There is no guarding or rebound.  Musculoskeletal:     Cervical back: Normal range of motion.     Right lower leg: No edema.     Left lower leg: No edema.  Skin:    General: Skin is warm and dry.     Capillary Refill: Capillary refill takes less than 2 seconds.  Neurological:     Mental Status: She is alert. Mental status is at baseline.  Psychiatric:        Mood and Affect: Mood normal.        Behavior: Behavior normal.     ED Results / Procedures / Treatments   Labs (all labs ordered are listed, but only abnormal results are displayed) Labs Reviewed  RESP PANEL BY RT-PCR (RSV, FLU A&B, COVID)  RVPGX2 - Abnormal; Notable for the following components:      Result Value   SARS Coronavirus 2 by RT PCR POSITIVE (*)    All other components within normal limits    EKG None  Radiology No results found.  Procedures Procedures    Medications Ordered in ED Medications - No data to display  ED Course/ Medical Decision Making/ A&P                             Medical Decision Making Risk OTC drugs. Prescription drug management.   Patient is a 47 year old female present emergency room today with complaints of cough congestion runny nose body aches fatigue generalized weakness for the past 2 or 3 days.  She denies any chest pain no vomiting no syncope.  No unilateral leg swelling or hemoptysis.  No other associate symptoms.   Physical exam unremarkable patient is  nontoxic with normal vital signs apart from mild hypertension no tachypnea or hypoxia.  SpO2 100% on room air on my exam.  PCR positive for COVID-19.  No focal crackles on exam lung sounds are normal.  Will discharge home with conservative therapy and recommendations to follow-up with PCP.  Work note provided at patient's request Zofran ibuprofen Tylenol recommend hydration  Final Clinical Impression(s) / ED Diagnoses Final diagnoses:  COVID-19    Rx / DC Orders ED Discharge Orders          Ordered    ondansetron (ZOFRAN-ODT) 4 MG disintegrating tablet  Every 8 hours PRN        11/15/22 2304    ibuprofen (ADVIL) 600 MG tablet  Every 6 hours PRN        11/15/22 2304    acetaminophen (TYLENOL) 500 MG tablet  Every 6 hours PRN        11/15/22 2304              Tedd Sias, [place] 11/16/22 2345    Orpah Greek, MD 11/17/22 [phone]

## 2022-11-15 NOTE — ED Triage Notes (Signed)
Pt BIB EMS with flu like symptoms x 2 days. Pt is complaining of cough, fever, body aches,and SHOB.

## 2022-11-17 ENCOUNTER — Telehealth: Payer: BC Managed Care – PPO | Admitting: Nurse Practitioner

## 2022-11-17 DIAGNOSIS — U071 COVID-19: Secondary | ICD-10-CM | POA: Diagnosis not present

## 2022-11-17 NOTE — Progress Notes (Signed)
Virtual Visit Consent   Brianna Gutierrez, you are scheduled for a virtual visit with a [facility] provider today. Just as with appointments in the office, your consent must be obtained to participate. Your consent will be active for this visit and any virtual visit you may have with one of our providers in the next 365 days. If you have a MyChart account, a copy of this consent can be sent to you electronically.  As this is a virtual visit, video technology does not allow for your provider to perform a traditional examination. This may limit your provider's ability to fully assess your condition. If your provider identifies any concerns that need to be evaluated in person or the need to arrange testing (such as labs, EKG, etc.), we will make arrangements to do so. Although advances in technology are sophisticated, we cannot ensure that it will always work on either your end or our end. If the connection with a video visit is poor, the visit may have to be switched to a telephone visit. With either a video or telephone visit, we are not always able to ensure that we have a secure connection.  By engaging in this virtual visit, you consent to the provision of healthcare and authorize for your insurance to be billed (if applicable) for the services provided during this visit. Depending on your insurance coverage, you may receive a charge related to this service.  I need to obtain your verbal consent now. Are you willing to proceed with your visit today? Brianna Gutierrez has provided verbal consent on 11/17/2022 for a virtual visit (video or telephone). Gildardo Pounds, NP  Date: 11/17/2022 9:38 AM  Virtual Visit via Video Note   I, Gildardo Pounds, connected with  Brianna Gutierrez  ([id number], [birth year]) on 11/17/22 at 10:00 AM EST by a video-enabled telemedicine application and verified that I am speaking with the correct person using two identifiers.  Location: Patient: Virtual Visit  Location Patient: Home Provider: Virtual Visit Location Provider: Home Office   I discussed the limitations of evaluation and management by telemedicine and the availability of in person appointments. The patient expressed understanding and agreed to proceed.    History of Present Illness: Brianna Gutierrez is a 47 y.o. who identifies as a female who was assigned female at birth, and is being seen today for COVID Positive symptoms and requesting a work note   Brianna Gutierrez was diagnosed with [place] on 11-15-2022. She is currently treating her symptoms conservatively. Endorsing: cough congestion runny nose myalgias fatigue generalized weakness for the past several days.  She denies any chest pain no vomiting no syncope.  No unilateral leg swelling or hemoptysis.  No other associate symptoms. She is expected to work today and states due to her symptoms she will not be able to perform her job duties.    Problems:  Patient Active Problem List   Diagnosis Date Noted   Other adverse food reactions, not elsewhere classified, subsequent encounter 09/19/2022   Pruritus 08/15/2022   Essential hypertension 04/30/2022   Pruritus/urticaria 04/19/2022   Shortness of breath 04/19/2022   Other allergic rhinitis 04/19/2022   Pulmonary embolism ([place]) 11/30/2021   Smoking 10/26/2021   Postoperative intra-abdominal abscess    Medication monitoring encounter    Pelvic infection in female 09/24/2021   Status post laparoscopic hysterectomy 09/11/2021   Cholelithiasis 01/13/2019   Pre-diabetes 11/15/2016   History of bilateral tubal ligation 09/28/2015   Tobacco abuse 09/28/2015  Allergies:  Allergies  Allergen Reactions   Pork-Derived Products    Tramadol     rash   Tramadol Other (See Comments)    Headache   Medications:  Current Outpatient Medications:    acetaminophen (TYLENOL) 500 MG tablet, Take 2 tablets (1,000 mg total) by mouth every 6 (six) hours as needed., Disp: 30 tablet, Rfl: 0    albuterol (VENTOLIN HFA) 108 (90 Base) MCG/ACT inhaler, Inhale 2 puffs into the lungs every 6 (six) hours as needed for wheezing or shortness of breath., Disp: 8 g, Rfl: 2   amLODipine (NORVASC) 10 MG tablet, Take 1 tablet (10 mg total) by mouth every evening., Disp: 30 tablet, Rfl: 2   EPINEPHrine (EPIPEN 2-PAK) 0.3 mg/0.3 mL IJ SOAJ injection, Inject 0.3 mg into the muscle as needed for anaphylaxis. (Patient taking differently: Inject 0.3 mg into the muscle once as needed for anaphylaxis.), Disp: 2 each, Rfl: 0   famotidine (PEPCID) 20 MG tablet, Take 1 tablet (20 mg total) by mouth 2 (two) times daily., Disp: 60 tablet, Rfl: 3   fexofenadine (ALLEGRA) 180 MG tablet, Take 2 tablets twice a day for hives., Disp: 120 tablet, Rfl: 2   gabapentin (NEURONTIN) 300 MG capsule, Take 1 capsule (300 mg total) by mouth at bedtime., Disp: 30 capsule, Rfl: 2   hydrochlorothiazide (HYDRODIURIL) 12.5 MG tablet, Take 1 tablet (12.5 mg total) by mouth daily., Disp: 30 tablet, Rfl: 2   ibuprofen (ADVIL) 600 MG tablet, Take 1 tablet (600 mg total) by mouth every 6 (six) hours as needed., Disp: 30 tablet, Rfl: 0   lamoTRIgine (LAMICTAL) 100 MG tablet, Take 1 tablet (100 mg total) by mouth daily., Disp: 30 tablet, Rfl: 1   metFORMIN (GLUCOPHAGE) 500 MG tablet, Take 1 tablet (500 mg total) by mouth 2 (two) times daily with a meal., Disp: 60 tablet, Rfl: 2   ondansetron (ZOFRAN-ODT) 4 MG disintegrating tablet, Take 1 tablet (4 mg total) by mouth every 8 (eight) hours as needed for nausea or vomiting., Disp: 20 tablet, Rfl: 0   traZODone (DESYREL) 100 MG tablet, Take 1 tablet (100 mg total) by mouth at bedtime., Disp: 30 tablet, Rfl: 2   XOLAIR 150 MG/ML prefilled syringe, Inject 300 mg into the skin every 28 (twenty-eight) days., Disp: , Rfl:   Current Facility-Administered Medications:    omalizumab Arvid Right) injection 150 mg, 150 mg, Subcutaneous, Q28 days, Rexene Alberts M, DO, 150 mg at 10/18/22  1523  Observations/Objective: Patient is well-developed, well-nourished in no acute distress.  Resting comfortably at home.  Head is normocephalic,atraumatic.  No labored breathing.  Speech is clear and coherent with logical content.  Patient is alert and oriented at baseline.    Assessment and Plan: 1. Positive self-administered antigen test for COVID-19 WORK NOTE placed in Mychart.  Continue conservative measures  Follow Up Instructions: I discussed the assessment and treatment plan with the patient. The patient was provided an opportunity to ask questions and all were answered. The patient agreed with the plan and demonstrated an understanding of the instructions.  A copy of instructions were sent to the patient via MyChart unless otherwise noted below.    The patient was advised to call back or seek an in-person evaluation if the symptoms worsen or if the condition fails to improve as anticipated.  Time:  I spent 11 minutes with the patient via telehealth technology discussing the above problems/concerns.    Gildardo Pounds, NP

## 2022-11-17 NOTE — Patient Instructions (Signed)
Brianna Gutierrez, thank you for joining Gildardo Pounds, NP for today's virtual visit.  While this provider is not your primary care provider (PCP), if your PCP is located in our provider database this encounter information will be shared with them immediately following your visit.   [facility] account gives you access to today's visit and all your visits, tests, and labs performed at [facility] " click here if you don't have a [facility] account or go to [email or web address]  Consent: (Patient) Brianna Gutierrez provided verbal consent for this virtual visit at the beginning of the encounter.  Current Medications:  Current Outpatient Medications:    acetaminophen (TYLENOL) 500 MG tablet, Take 2 tablets (1,000 mg total) by mouth every 6 (six) hours as needed., Disp: 30 tablet, Rfl: 0   albuterol (VENTOLIN HFA) 108 (90 Base) MCG/ACT inhaler, Inhale 2 puffs into the lungs every 6 (six) hours as needed for wheezing or shortness of breath., Disp: 8 g, Rfl: 2   amLODipine (NORVASC) 10 MG tablet, Take 1 tablet (10 mg total) by mouth every evening., Disp: 30 tablet, Rfl: 2   EPINEPHrine (EPIPEN 2-PAK) 0.3 mg/0.3 mL IJ SOAJ injection, Inject 0.3 mg into the muscle as needed for anaphylaxis. (Patient taking differently: Inject 0.3 mg into the muscle once as needed for anaphylaxis.), Disp: 2 each, Rfl: 0   famotidine (PEPCID) 20 MG tablet, Take 1 tablet (20 mg total) by mouth 2 (two) times daily., Disp: 60 tablet, Rfl: 3   fexofenadine (ALLEGRA) 180 MG tablet, Take 2 tablets twice a day for hives., Disp: 120 tablet, Rfl: 2   gabapentin (NEURONTIN) 300 MG capsule, Take 1 capsule (300 mg total) by mouth at bedtime., Disp: 30 capsule, Rfl: 2   hydrochlorothiazide (HYDRODIURIL) 12.5 MG tablet, Take 1 tablet (12.5 mg total) by mouth daily., Disp: 30 tablet, Rfl: 2   ibuprofen (ADVIL) 600 MG tablet, Take 1 tablet (600 mg total) by mouth every 6 (six) hours as  needed., Disp: 30 tablet, Rfl: 0   lamoTRIgine (LAMICTAL) 100 MG tablet, Take 1 tablet (100 mg total) by mouth daily., Disp: 30 tablet, Rfl: 1   metFORMIN (GLUCOPHAGE) 500 MG tablet, Take 1 tablet (500 mg total) by mouth 2 (two) times daily with a meal., Disp: 60 tablet, Rfl: 2   ondansetron (ZOFRAN-ODT) 4 MG disintegrating tablet, Take 1 tablet (4 mg total) by mouth every 8 (eight) hours as needed for nausea or vomiting., Disp: 20 tablet, Rfl: 0   traZODone (DESYREL) 100 MG tablet, Take 1 tablet (100 mg total) by mouth at bedtime., Disp: 30 tablet, Rfl: 2   XOLAIR 150 MG/ML prefilled syringe, Inject 300 mg into the skin every 28 (twenty-eight) days., Disp: , Rfl:   Current Facility-Administered Medications:    omalizumab Arvid Right) injection 150 mg, 150 mg, Subcutaneous, Q28 days, Rexene Alberts M, DO, 150 mg at 10/18/22 1523   Medications ordered in this encounter:  No orders of the defined types were placed in this encounter.    *If you need refills on other medications prior to your next appointment, please contact your pharmacy*  Follow-Up: Call back or seek an in-person evaluation if the symptoms worsen or if the condition fails to improve as anticipated.  [facility] [phone]  Other Instructions Continue conservative measures   If you have been instructed to have an in-person evaluation today at a local Urgent Care facility, please use the link below. It will take you to a  list of all of our available [facility], including address, phone number and hours of operation. Please do not delay care.  [facility]  If you or a family member do not have a primary care provider, use the link below to schedule a visit and establish care. When you choose a [facility] primary care physician or advanced practice provider, you gain a long-term partner in health. Find a Primary Care Provider  Learn more about [facility]'s in-office and virtual care  options: [facility]

## 2022-11-21 ENCOUNTER — Telehealth: Payer: BC Managed Care – PPO | Admitting: Nurse Practitioner

## 2022-11-21 ENCOUNTER — Emergency Department (HOSPITAL_BASED_OUTPATIENT_CLINIC_OR_DEPARTMENT_OTHER): Payer: BC Managed Care – PPO | Admitting: Radiology

## 2022-11-21 ENCOUNTER — Encounter (HOSPITAL_BASED_OUTPATIENT_CLINIC_OR_DEPARTMENT_OTHER): Payer: Self-pay

## 2022-11-21 ENCOUNTER — Telehealth: Payer: BC Managed Care – PPO | Admitting: Family

## 2022-11-21 ENCOUNTER — Emergency Department (HOSPITAL_BASED_OUTPATIENT_CLINIC_OR_DEPARTMENT_OTHER)
Admission: EM | Admit: 2022-11-21 | Discharge: 2022-11-21 | Disposition: A | Discharge status: Home / Self Care | Payer: BC Managed Care – PPO | Attending: Emergency Medicine | Admitting: Emergency Medicine

## 2022-11-21 ENCOUNTER — Other Ambulatory Visit: Payer: Self-pay

## 2022-11-21 ENCOUNTER — Ambulatory Visit: Payer: BC Managed Care – PPO

## 2022-11-21 DIAGNOSIS — R0602 Shortness of breath: Secondary | ICD-10-CM

## 2022-11-21 DIAGNOSIS — I10 Essential (primary) hypertension: Secondary | ICD-10-CM | POA: Insufficient documentation

## 2022-11-21 DIAGNOSIS — Z79899 Other long term (current) drug therapy: Secondary | ICD-10-CM | POA: Diagnosis not present

## 2022-11-21 DIAGNOSIS — J1282 Pneumonia due to coronavirus disease 2019: Secondary | ICD-10-CM

## 2022-11-21 DIAGNOSIS — R509 Fever, unspecified: Secondary | ICD-10-CM | POA: Diagnosis not present

## 2022-11-21 DIAGNOSIS — J1289 Other viral pneumonia: Secondary | ICD-10-CM | POA: Diagnosis not present

## 2022-11-21 DIAGNOSIS — R5383 Other fatigue: Secondary | ICD-10-CM | POA: Diagnosis not present

## 2022-11-21 DIAGNOSIS — F17200 Nicotine dependence, unspecified, uncomplicated: Secondary | ICD-10-CM | POA: Diagnosis not present

## 2022-11-21 DIAGNOSIS — U071 COVID-19: Secondary | ICD-10-CM | POA: Diagnosis not present

## 2022-11-21 DIAGNOSIS — R059 Cough, unspecified: Secondary | ICD-10-CM | POA: Diagnosis not present

## 2022-11-21 LAB — BASIC METABOLIC PANEL
Anion gap: 10 (ref 5–15)
BUN: 15 mg/dL (ref 6–20)
CO2: 22 mmol/L (ref 22–32)
Calcium: 9.2 mg/dL (ref 8.9–10.3)
Chloride: 105 mmol/L (ref 98–111)
Creatinine, Ser: 0.64 mg/dL (ref 0.44–1.00)
GFR, Estimated: >60 mL/min (ref >60)
Glucose, Bld: 81 mg/dL (ref 70–99)
Potassium: 4.3 mmol/L (ref 3.5–5.1)
Sodium: 137 mmol/L (ref 135–145)

## 2022-11-21 LAB — CBC WITH DIFFERENTIAL/PLATELET
Abs Immature Granulocytes: 0.01 10*3/uL (ref 0.00–0.07)
Basophils Absolute: 0 10*3/uL (ref 0.0–0.1)
Basophils Relative: 0 %
Eosinophils Absolute: 0.3 10*3/uL (ref 0.0–0.5)
Eosinophils Relative: 4 %
HCT: 42.2 % (ref 36.0–46.0)
Hemoglobin: 13.4 g/dL (ref 12.0–15.0)
Immature Granulocytes: 0 %
Lymphocytes Relative: 38 %
Lymphs Abs: 2.4 10*3/uL (ref 0.7–4.0)
MCH: 22.5 pg — ABNORMAL LOW (ref 26.0–34.0)
MCHC: 31.8 g/dL (ref 30.0–36.0)
MCV: 70.9 fL — ABNORMAL LOW (ref 80.0–100.0)
Monocytes Absolute: 0.7 10*3/uL (ref 0.1–1.0)
Monocytes Relative: 11 %
Neutro Abs: 3 10*3/uL (ref 1.7–7.7)
Neutrophils Relative %: 47 %
Platelets: 376 10*3/uL (ref 150–400)
RBC: 5.95 MIL/uL — ABNORMAL HIGH (ref 3.87–5.11)
RDW: 15.4 % (ref 11.5–15.5)
WBC: 6.3 10*3/uL (ref 4.0–10.5)
nRBC: 0 % (ref 0.0–0.2)

## 2022-11-21 MED ORDER — DOXYCYCLINE HYCLATE 100 MG PO CAPS: 100.0000 mg | ORAL_CAPSULE | Freq: Two times a day (BID) | ORAL | 0 refills | Status: AC

## 2022-11-21 MED ORDER — SODIUM CHLORIDE 0.9 % IV BOLUS
1000.0000 mL | Freq: Once | INTRAVENOUS | Status: AC
  Administered 2022-11-21: 1000 mL via INTRAVENOUS

## 2022-11-21 NOTE — ED Provider Notes (Signed)
[facility] Provider Note   CSN: [id number] Arrival date & time: 11/21/22  1652     History  Chief Complaint  Patient presents with   Generalized Body Aches    Brianna Gutierrez is a 47 y.o. female with history significant for HTN, tobacco use, PE and recent COVID-19 diagnosis presents to the ED for worsening cough, fatigue, and body aches.  Patient states she has had symptoms for the past 8 days and felt somewhat better on Sunday and Monday, but her symptoms worsened yesterday and into today.  She reports she is also had reduced appetite but has been able to drink fluids.  She also reports chills but no known fever.      Home Medications Prior to Admission medications   Medication Sig Start Date End Date Taking? Authorizing Provider  doxycycline (VIBRAMYCIN) 100 MG capsule Take 1 capsule (100 mg total) by mouth 2 (two) times daily for 5 days. 11/21/22 11/26/22 Yes Dwight Burdo R, PA  acetaminophen (TYLENOL) 500 MG tablet Take 2 tablets (1,000 mg total) by mouth every 6 (six) hours as needed. 11/15/22   Tedd Sias, PA  albuterol (VENTOLIN HFA) 108 (90 Base) MCG/ACT inhaler Inhale 2 puffs into the lungs every 6 (six) hours as needed for wheezing or shortness of breath. 02/14/22   Cobb, Karie Schwalbe, NP  amLODipine (NORVASC) 10 MG tablet Take 1 tablet (10 mg total) by mouth every evening. 07/30/22 10/28/22  [name], NP  EPINEPHrine (EPIPEN 2-PAK) 0.3 mg/0.3 mL IJ SOAJ injection Inject 0.3 mg into the muscle as needed for anaphylaxis. Patient taking differently: Inject 0.3 mg into the muscle once as needed for anaphylaxis. 03/15/22   [name], NP  famotidine (PEPCID) 20 MG tablet Take 1 tablet (20 mg total) by mouth 2 (two) times daily. 04/18/22   Garnet Sierras, DO  fexofenadine (ALLEGRA) 180 MG tablet Take 2 tablets twice a day for hives. 08/21/22   Garnet Sierras, DO  gabapentin (NEURONTIN) 300 MG capsule Take 1 capsule (300 mg  total) by mouth at bedtime. 07/30/22   [name], NP  hydrochlorothiazide (HYDRODIURIL) 12.5 MG tablet Take 1 tablet (12.5 mg total) by mouth daily. 07/30/22 10/28/22  [name], NP  ibuprofen (ADVIL) 600 MG tablet Take 1 tablet (600 mg total) by mouth every 6 (six) hours as needed. 11/15/22   Tedd Sias, PA  lamoTRIgine (LAMICTAL) 100 MG tablet Take 1 tablet (100 mg total) by mouth daily. 08/28/22 08/28/23  Vista Mink, MD  metFORMIN (GLUCOPHAGE) 500 MG tablet Take 1 tablet (500 mg total) by mouth 2 (two) times daily with a meal. 07/30/22 10/28/22  [name], NP  ondansetron (ZOFRAN-ODT) 4 MG disintegrating tablet Take 1 tablet (4 mg total) by mouth every 8 (eight) hours as needed for nausea or vomiting. 11/15/22   Tedd Sias, PA  traZODone (DESYREL) 100 MG tablet Take 1 tablet (100 mg total) by mouth at bedtime. 09/27/22   Vista Mink, MD  Arvid Right 150 MG/ML prefilled syringe Inject 300 mg into the skin every 28 (twenty-eight) days. 08/27/22   [provider]  dicyclomine (BENTYL) 20 MG tablet Take 1 tablet (20 mg total) by mouth 2 (two) times daily. 10/29/19 08/24/20  Ok Edwards, PA-C  omeprazole (PRILOSEC) 20 MG capsule Take 1 capsule (20 mg total) by mouth daily. 01/01/19 08/07/19  Scot Jun, NP      Allergies  Pork-derived products, Tramadol, and Tramadol    Review of Systems   Review of Systems  Constitutional:  Positive for appetite change, chills and fatigue. Negative for fever.  HENT:  Positive for sore throat.   Respiratory:  Positive for cough. Negative for chest tightness and shortness of breath.   Cardiovascular:  Negative for chest pain.  Gastrointestinal:  Negative for abdominal pain, diarrhea, nausea and vomiting.  Musculoskeletal:  Positive for myalgias.    Physical Exam Updated Vital Signs BP 117/70 (BP Location: Left Arm)   Pulse 90   Temp 98 F (36.7 C) (Oral)   Resp 18   Ht '5\' 2"'$  (1.575 m)   Wt 77.1 kg   LMP  (LMP  Unknown)   SpO2 99%   BMI 31.09 kg/m  Physical Exam Vitals and nursing note reviewed.  Constitutional:      General: She is not in acute distress.    Appearance: She is not ill-appearing.  HENT:     Mouth/Throat:     Mouth: Mucous membranes are moist.     Pharynx: Oropharynx is clear.  Cardiovascular:     Rate and Rhythm: Normal rate and regular rhythm.     Pulses: Normal pulses.     Heart sounds: Normal heart sounds.  Pulmonary:     Effort: Pulmonary effort is normal. No respiratory distress.     Breath sounds: Normal breath sounds and air entry.  Abdominal:     General: Abdomen is flat. Bowel sounds are normal. There is no distension.     Palpations: Abdomen is soft.     Tenderness: There is no abdominal tenderness.  Skin:    General: Skin is warm and dry.     Capillary Refill: Capillary refill takes less than 2 seconds.     Coloration: Skin is not cyanotic.  Neurological:     Mental Status: She is alert. Mental status is at baseline.  Psychiatric:        Mood and Affect: Mood normal.        Behavior: Behavior normal.     ED Results / Procedures / Treatments   Labs (all labs ordered are listed, but only abnormal results are displayed) Labs Reviewed  CBC WITH DIFFERENTIAL/PLATELET - Abnormal; Notable for the following components:      Result Value   RBC 5.95 (*)    MCV 70.9 (*)    MCH 22.5 (*)    All other components within normal limits  BASIC METABOLIC PANEL    EKG None  Radiology DG Chest 2 View  Result Date: 11/21/2022 CLINICAL DATA:  Cough. Recently COVID positive. EXAM: CHEST - 2 VIEW COMPARISON:  Chest radiograph 04/25/2022 FINDINGS: The heart is normal in size. Stable mediastinal contours. Patchy and ill-defined bilateral lung opacities in a mid-lower lung zone predominant distribution. Mild background bronchial thickening. No pneumothorax or pleural effusion. No acute osseous findings. IMPRESSION: Patchy and ill-defined bilateral lung opacities,  pattern consistent with COVID-19 pneumonia. Mild background bronchial thickening. Electronically Signed   By: Keith Rake M.D.   On: 11/21/2022 18:02    Procedures Procedures    Medications Ordered in ED Medications  sodium chloride 0.9 % bolus 1,000 mL (1,000 mLs Intravenous New Bag/Given 11/21/22 2154)    ED Course/ Medical Decision Making/ A&P                             Medical Decision Making Amount and/or Complexity of Data Reviewed  Labs: ordered. Radiology: ordered.  Risk Prescription drug management.   This patient presents to the ED with chief complaint(s) of worsening COVID-19 symptoms with pertinent past medical history of hypertension, tobacco use, recent COVID-19 diagnosis on 11/15/2022, PE.The complaint involves an extensive differential diagnosis and also carries with it a high risk of complications and morbidity.    The differential diagnosis includes COVID-19 pneumonia, continued COVID-19 symptoms, acute bronchitis  The initial plan is to obtain chest x-ray and baseline labs  Additional history obtained: Additional history obtained from  none Records reviewed  previous ED documents  Initial Assessment:   On exam, patient is overall well-appearing and is not in acute distress.  Lungs are clear to auscultation bilaterally in the upper lobes with fine rales in the lower lobes.  Difficult to fully assess due to patient's body habitus.  Heart rate is normal in the 80s with a regular rhythm.  Skin is warm and dry nondiaphoretic.  Abdomen is soft and nontender to palpation.  She is not tachypneic and is currently satting 99% on room air.  Independent ECG/labs interpretation:  The following labs were independently interpreted:  CBC without evidence of leukocytosis or anemia.  Platelet counts are also within normal range.  Metabolic panel without electrolyte disturbance.  Renal function is within normal range.  Independent visualization and interpretation of  imaging: I independently visualized the following imaging with scope of interpretation limited to determining acute life threatening conditions related to emergency care: Chest x-ray, which revealed bilateral patchy opacities consistent with COVID-19 pneumonia.  Treatment and Reassessment: Patient reports that she feels like she is somewhat dehydrated.  Will give her IV fluid bolus to see if this improves.  Patient has been eating and drinking normally.  Given her overall reassuring workup, I feel that her pneumonia can be managed as an outpatient with oral antibiotics.  Discussed this with patient as well as strict return precautions should her symptoms not improve or she becomes increasingly short of breath which may require hospital admission for management.  Patient satted at 95% while ambulating and did not complain of shortness of breath.  Patient is in agreement with plan of discharge and oral antibiotics to treat COVID-19 pneumonia.  Disposition:   The patient has been appropriately medically screened and/or stabilized in the ED. I have low suspicion for any other emergent medical condition which would require further screening, evaluation or treatment in the ED or require inpatient management. At time of discharge the patient is hemodynamically stable and in no acute distress. I have discussed work-up results and diagnosis with patient and answered all questions. Patient is agreeable with discharge plan. We discussed strict return precautions for returning to the emergency department and they verbalized understanding.  Patient has follow-up with PCP on Monday already scheduled.            Final Clinical Impression(s) / ED Diagnoses Final diagnoses:  Pneumonia due to COVID-19 virus    Rx / DC Orders ED Discharge Orders          Ordered    doxycycline (VIBRAMYCIN) 100 MG capsule  2 times daily        11/21/22 2205              Pat Kocher, [place] 11/21/22 2253    Audley Hose, MD 11/21/22 2342

## 2022-11-21 NOTE — ED Notes (Signed)
95% on room air while ambulating

## 2022-11-21 NOTE — Progress Notes (Signed)
Virtual Visit Consent   Brianna Gutierrez, you are scheduled for a virtual visit with a [name] provider today. Just as with appointments in the office, your consent must be obtained to participate. Your consent will be active for this visit and any virtual visit you may have with one of our providers in the next 365 days. If you have a MyChart account, a copy of this consent can be sent to you electronically.  As this is a virtual visit, video technology does not allow for your provider to perform a traditional examination. This may limit your provider's ability to fully assess your condition. If your provider identifies any concerns that need to be evaluated in person or the need to arrange testing (such as labs, EKG, etc.), we will make arrangements to do so. Although advances in technology are sophisticated, we cannot ensure that it will always work on either your end or our end. If the connection with a video visit is poor, the visit may have to be switched to a telephone visit. With either a video or telephone visit, we are not always able to ensure that we have a secure connection.  By engaging in this virtual visit, you consent to the provision of healthcare and authorize for your insurance to be billed (if applicable) for the services provided during this visit. Depending on your insurance coverage, you may receive a charge related to this service.  I need to obtain your verbal consent now. Are you willing to proceed with your visit today? Brianna Gutierrez has provided verbal consent on 11/21/2022 for a virtual visit (video or telephone). [name], FNP  Date: 11/21/2022 3:28 PM  Virtual Visit via Video Note   I, [name], connected with  Brianna Gutierrez  ([id number], November 23, 1975) on 11/21/22 at  4:45 PM EST by a video-enabled telemedicine application and verified that I am speaking with the correct person using two identifiers.  Location: Patient: Virtual Visit  Location Patient: Home Provider: Virtual Visit Location Provider: Home Office   I discussed the limitations of evaluation and management by telemedicine and the availability of in person appointments. The patient expressed understanding and agreed to proceed.    History of Present Illness: Brianna Gutierrez is a 47 y.o. who identifies as a female who was assigned female at birth, and is being seen today for was seen in the ED and diagnosed with COVID on 11/15/22. Symptom onset was approximately 11/12/22.  She has been treating her symptoms with over the counter medications  She has been using tylenol mainly   Today is approximately day 10  Two days ago she thought she was feeling better and went back to work. Then yesterday and today she felt much worse She has fatigue, body aches, a cough that is mostly dry and productive at times.  She is having some tightness in her chest, she does have an inhaler and has been using it without relief.   She had a PE last year, she has been off blood thinners for "awhile" she states  She was also in ED in October with chest pain and left before receiving treatment  Chest XRAY 07/2022 FINDINGS: Cardiopericardial silhouette is at upper limits of normal for size. Bibasilar atelectasis noted. No focal consolidation or pulmonary edema. No evidence for pneumothorax. No pleural effusion. No worrisome lytic or sclerotic osseous abnormality.   IMPRESSION: Bibasilar atelectasis.  Otherwise no acute findings.  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   Cholelithiasis 01/13/2019   Pre-diabetes 11/15/2016   History of bilateral tubal ligation 09/28/2015   Tobacco abuse 09/28/2015    Allergies:  Allergies  Allergen Reactions   Pork-Derived Products    Tramadol     rash   Tramadol Other (See Comments)    Headache   Medications:  Current Outpatient Medications:    acetaminophen (TYLENOL) 500 MG tablet, Take 2 tablets (1,000 mg total) by mouth every 6 (six) hours as needed., Disp: 30 tablet, Rfl: 0   albuterol (VENTOLIN HFA) 108 (90 Base) MCG/ACT inhaler, Inhale 2 puffs into the lungs every 6 (six) hours as needed for wheezing or shortness of breath., Disp: 8 g, Rfl: 2   amLODipine (NORVASC) 10 MG tablet, Take 1 tablet (10 mg total) by mouth every evening., Disp: 30 tablet, Rfl: 2   EPINEPHrine (EPIPEN 2-PAK) 0.3 mg/0.3 mL IJ SOAJ injection, Inject 0.3 mg into the muscle as needed for anaphylaxis. (Patient taking differently: Inject 0.3 mg into the muscle once as needed for anaphylaxis.), Disp: 2 each, Rfl: 0   famotidine (PEPCID) 20 MG tablet, Take 1 tablet (20 mg total) by mouth 2 (two) times daily., Disp: 60 tablet, Rfl: 3   fexofenadine (ALLEGRA) 180 MG tablet, Take 2 tablets twice a day for hives., Disp: 120 tablet, Rfl: 2   gabapentin (NEURONTIN) 300 MG capsule, Take 1 capsule (300 mg total) by mouth at bedtime., Disp: 30 capsule, Rfl: 2   hydrochlorothiazide (HYDRODIURIL) 12.5 MG tablet, Take 1 tablet (12.5 mg total) by mouth daily., Disp: 30 tablet, Rfl: 2   ibuprofen (ADVIL) 600 MG tablet, Take 1 tablet (600 mg total) by mouth every 6 (six) hours as needed., Disp: 30 tablet, Rfl: 0   lamoTRIgine (LAMICTAL) 100 MG tablet, Take 1 tablet (100 mg total) by mouth daily., Disp: 30 tablet, Rfl: 1   metFORMIN (GLUCOPHAGE) 500 MG tablet, Take 1 tablet (500 mg total) by mouth 2 (two) times daily with a meal., Disp: 60 tablet, Rfl: 2   ondansetron  (ZOFRAN-ODT) 4 MG disintegrating tablet, Take 1 tablet (4 mg total) by mouth every 8 (eight) hours as needed for nausea or vomiting., Disp: 20 tablet, Rfl: 0   traZODone (DESYREL) 100 MG tablet, Take 1 tablet (100 mg total) by mouth at bedtime., Disp: 30 tablet, Rfl: 2   XOLAIR 150 MG/ML prefilled syringe, Inject 300 mg into the skin every 28 (twenty-eight) days., Disp: , Rfl:   Current Facility-Administered Medications:    omalizumab Arvid Right) injection 150 mg, 150 mg, Subcutaneous, Q28 days, Rexene Alberts M, DO, 150 mg at 10/18/22 1523  Observations/Objective: Patient is well-developed, well-nourished  Resting comfortably  at home.  Head is normocephalic, atraumatic.  No labored breathing.  Speech is clear and coherent with logical content.  Patient is alert and oriented at baseline.    Assessment and Plan: 1. COVID-19   2. Shortness of breath With history of PE recent Chest Xray with Bibasilar atelectasi patient is high risk for pneumonia, PE / complications from COVID. Advised ED visit for workup today.  Patient is at home alone, does not have transportation. Will call [phone]     Checked in with patient she is in transportation via ambulance on the way to [place]   Follow Up Instructions: I discussed the assessment and treatment plan with the patient. The patient was provided an opportunity to ask questions and all were answered. The patient agreed with the plan and demonstrated an understanding of the instructions.  A copy of instructions were sent to the patient via MyChart unless otherwise noted below.    The patient was advised to call back or seek an in-person evaluation if the symptoms worsen or if the condition fails to improve as anticipated.  Time:  I spent 15 minutes with the patient via telehealth technology discussing the above problems/concerns.    [name], FNP

## 2022-11-21 NOTE — Discharge Instructions (Addendum)
Thank you for allowing me to be part of your care today.  Your x-ray imaging was consistent with COVID-19 pneumonia.  Your overall laboratory workup is very reassuring and I do not feel that hospital admission is required at this time.  Your pneumonia will be treated with oral antibiotics that I have sent to your pharmacy.  Please take these for the entirety of their course.  Return to the ED if you experience worsening of your symptoms or if you have any new concerns.

## 2022-11-21 NOTE — ED Triage Notes (Signed)
Patient BIB [facility] from Home.  Endorses Flu-Like Symptoms for 8 Days now. Seen at another ED for Same earlier and diagnosed with COVID-19. Initially felt better Monday and went back to work but returns for re-evaluation as she has had worsening Cough, Fatigue, Aches since then.   No Fever recently.   NAD Noted during Triage. A&Ox4. GCS 15. BIB Wheelchair.

## 2022-11-23 ENCOUNTER — Other Ambulatory Visit: Payer: Self-pay | Admitting: Family

## 2022-11-23 DIAGNOSIS — Z91018 Allergy to other foods: Secondary | ICD-10-CM

## 2022-11-23 DIAGNOSIS — L309 Dermatitis, unspecified: Secondary | ICD-10-CM

## 2022-11-23 NOTE — Progress Notes (Unsigned)
Patient ID: Brianna Gutierrez, female    DOB: 10-23-1976  MRN: [id number]  CC: Emergency Department Follow-Up  Subjective: Brianna Gutierrez is a 48 y.o. female who presents for emergency department follow-up.   Her concerns today include:  COVID f/u   11/21/2022 [facility] Emergency Department at [facility] per MD note: Medical Decision Making Amount and/or Complexity of Data Reviewed Labs: ordered. Radiology: ordered.   Risk Prescription drug management.   This patient presents to the ED with chief complaint(s) of worsening COVID-19 symptoms with pertinent past medical history of hypertension, tobacco use, recent COVID-19 diagnosis on 11/15/2022, PE.The complaint involves an extensive differential diagnosis and also carries with it a high risk of complications and morbidity.     The differential diagnosis includes COVID-19 pneumonia, continued COVID-19 symptoms, acute bronchitis   The initial plan is to obtain chest x-ray and baseline labs   Additional history obtained: Additional history obtained from  none Records reviewed  previous ED documents   Initial Assessment:   On exam, patient is overall well-appearing and is not in acute distress.  Lungs are clear to auscultation bilaterally in the upper lobes with fine rales in the lower lobes.  Difficult to fully assess due to patient's body habitus.  Heart rate is normal in the 80s with a regular rhythm.  Skin is warm and dry nondiaphoretic.  Abdomen is soft and nontender to palpation.  She is not tachypneic and is currently satting 99% on room air.   Independent ECG/labs interpretation:  The following labs were independently interpreted:  CBC without evidence of leukocytosis or anemia.  Platelet counts are also within normal range.  Metabolic panel without electrolyte disturbance.  Renal function is within normal range.   Independent visualization and interpretation of imaging: I independently visualized the  following imaging with scope of interpretation limited to determining acute life threatening conditions related to emergency care: Chest x-ray, which revealed bilateral patchy opacities consistent with COVID-19 pneumonia.   Treatment and Reassessment: Patient reports that she feels like she is somewhat dehydrated.  Will give her IV fluid bolus to see if this improves.  Patient has been eating and drinking normally.  Given her overall reassuring workup, I feel that her pneumonia can be managed as an outpatient with oral antibiotics.  Discussed this with patient as well as strict return precautions should her symptoms not improve or she becomes increasingly short of breath which may require hospital admission for management.  Patient satted at 95% while ambulating and did not complain of shortness of breath.  Patient is in agreement with plan of discharge and oral antibiotics to treat COVID-19 pneumonia.   Disposition:   The patient has been appropriately medically screened and/or stabilized in the ED. I have low suspicion for any other emergent medical condition which would require further screening, evaluation or treatment in the ED or require inpatient management. At time of discharge the patient is hemodynamically stable and in no acute distress. I have discussed work-up results and diagnosis with patient and answered all questions. Patient is agreeable with discharge plan. We discussed strict return precautions for returning to the emergency department and they verbalized understanding.  Patient has follow-up with PCP on Monday already scheduled.    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 30 tablet 0   albuterol (VENTOLIN HFA) 108 (90 Base) MCG/ACT inhaler Inhale 2 puffs into the lungs every 6 (six) hours as needed for wheezing or shortness of breath. 8 g 2   amLODipine (NORVASC) 10 MG tablet Take 1 tablet (10 mg total) by mouth every evening. 30 tablet 2   doxycycline (VIBRAMYCIN) 100 MG capsule Take 1 capsule (100 mg total) by mouth 2 (two) times daily for 5 days. 10 capsule 0   EPINEPHrine (EPIPEN 2-PAK) 0.3 mg/0.3 mL IJ SOAJ injection Inject 0.3 mg into the muscle as needed for anaphylaxis. (Patient taking differently: Inject 0.3 mg into the muscle once as needed for anaphylaxis.) 2 each 0   famotidine (PEPCID) 20 MG tablet Take 1 tablet (20 mg total) by mouth 2 (two) times daily. 60 tablet 3   fexofenadine (ALLEGRA) 180 MG tablet Take 2 tablets twice a day for hives. 120 tablet 2   gabapentin (NEURONTIN) 300 MG capsule Take 1 capsule (300 mg total) by mouth at bedtime. 30 capsule 2   hydrochlorothiazide (HYDRODIURIL) 12.5 MG tablet Take 1 tablet (12.5 mg total) by mouth daily. 30 tablet 2   ibuprofen (ADVIL) 600 MG tablet Take 1 tablet (600 mg total) by mouth every 6 (six) hours as needed. 30 tablet 0   lamoTRIgine (LAMICTAL) 100 MG tablet Take 1 tablet (100 mg total) by mouth daily. 30 tablet 1    metFORMIN (GLUCOPHAGE) 500 MG tablet Take 1 tablet (500 mg total) by mouth 2 (two) times daily with a meal. 60 tablet 2   ondansetron (ZOFRAN-ODT) 4 MG disintegrating tablet Take 1 tablet (4 mg total) by mouth every 8 (eight) hours as needed for nausea or vomiting. 20 tablet 0   traZODone (DESYREL) 100 MG tablet Take 1 tablet (100 mg total) by mouth at bedtime. 30 tablet 2   XOLAIR 150 MG/ML prefilled syringe Inject 300 mg into the skin every 28 (twenty-eight) days.     [DISCONTINUED] dicyclomine (BENTYL) 20 MG tablet Take 1 tablet (20 mg total) by mouth 2 (two) times daily. 20 tablet 0   [DISCONTINUED] omeprazole (PRILOSEC) 20 MG capsule Take 1 capsule (20 mg total) by mouth daily. 30 capsule 1   Current Facility-Administered Medications on File Prior to Visit  Medication Dose Route Frequency Provider Last Rate Last Admin   omalizumab Arvid Right) injection 150 mg  150 mg Subcutaneous Q28 days Garnet Sierras, DO   150 mg at 10/18/22 1523    Allergies  Allergen Reactions   Pork-Derived Products    Tramadol     rash   Tramadol Other (See Comments)    Headache    Social History   Socioeconomic History   Marital status: Married    Spouse name: Not on file  Number of children: Not on file   Years of education: Not on file   Highest education level: Not on file  Occupational History   Not on file  Tobacco Use   Smoking status: Every Day    Types: Cigarettes    Passive exposure: Never   Smokeless tobacco: Never  Vaping Use   Vaping Use: Former  Substance and Sexual Activity   Alcohol use: Not Currently   Drug use: Never   Sexual activity: Not Currently    Partners: Male    Birth control/protection: Surgical    Comment: [place] 09/11/21  Other Topics Concern   Not on file  Social History Narrative   ** Merged History Encounter **       Social Determinants of Health   Financial Resource Strain: Not on file  Food Insecurity: Not on file  Transportation Needs: Not on file   Physical Activity: Not on file  Stress: Not on file  Social Connections: Not on file  Intimate Partner Violence: Not on file    Family History  Problem Relation Age of Onset   Hypertension Maternal Grandmother    Diabetes Maternal Grandfather    Allergic rhinitis Daughter    Allergic rhinitis Daughter    Allergic rhinitis Son    Allergic rhinitis Son    Colon cancer Neg Hx    Esophageal cancer Neg Hx    Rectal cancer Neg Hx    Stomach cancer Neg Hx     Past Surgical History:  Procedure Laterality Date   BLADDER SUSPENSION N/A 09/11/2021   Procedure: TRANSVAGINAL TAPE (TVT) PROCEDURE;  Surgeon: [name], MD;  Location: [facility];  Service: Gynecology;  Laterality: N/A;   CESAREAN SECTION  1997   x 1   CHOLECYSTECTOMY N/A 01/14/2019   Procedure: LAPAROSCOPIC CHOLECYSTECTOMY WITH POSSIBLE  INTRAOPERATIVE CHOLANGIOGRAM;  Surgeon: Armandina Gemma, MD;  Location: [facility];  Service: General;  Laterality: N/A;   CYSTOSCOPY N/A 09/11/2021   Procedure: Consuela Mimes;  Surgeon: [name], MD;  Location[facility];  Service: Gynecology;  Laterality: N/A;   CYSTOSCOPY/URETEROSCOPY/HOLMIUM LASER Left 01/31/2022   Procedure: LEFT URETEROSCOPY/HOLMIUM LASER;  Surgeon: Vira Agar, MD;  Location: [facility];  Service: Urology;  Laterality: Left;   DILITATION & CURRETTAGE/HYSTROSCOPY WITH HYDROTHERMAL ABLATION N/A 06/22/2016   Procedure: DILATATION & CURETTAGE/HYSTEROSCOPY WITH HYDROTHERMAL ABLATION;  Surgeon: Shelly Bombard, MD;  Location: [facility];  Service: Gynecology;  Laterality: N/A;   TOTAL LAPAROSCOPIC HYSTERECTOMY WITH SALPINGECTOMY Bilateral 09/11/2021   Procedure: TOTAL LAPAROSCOPIC HYSTERECTOMY WITH SALPINGECTOMY;  Surgeon: [name], MD;  Location: [facility];  Service: Gynecology;  Laterality: Bilateral;   TUBAL LIGATION     09-28-2015    ROS: Review of Systems Negative except as stated  above  PHYSICAL EXAM: LMP  (LMP Unknown)   Physical Exam  {female adult master:310786} {female adult master:310785}     Latest Ref Rng & Units 11/21/2022    7:47 PM 08/21/2022    2:06 PM 07/30/2022    3:59 PM  CMP  Glucose 70 - 99 mg/dL 81  75    BUN 6 - 20 mg/dL 15  13    Creatinine 0.44 - 1.00 mg/dL 0.64  0.72    Sodium 135 - 145 mmol/L 137  138    Potassium 3.5 - 5.1 mmol/L 4.3  3.9    Chloride 98 - 111 mmol/L 105  103  CO2 22 - 32 mmol/L 22  23    Calcium 8.9 - 10.3 mg/dL 9.2  9.5    Total Protein 6.0 - 8.5 g/dL   7.4   Total Bilirubin 0.0 - 1.2 mg/dL   <0.2   Alkaline Phos 44 - 121 IU/L   132   AST 0 - 40 IU/L   12   ALT 0 - 32 IU/L   15    Lipid Panel     Component Value Date/Time   CHOL 205 (H) 12/23/2020 1528   TRIG 90 12/23/2020 1528   HDL 47 12/23/2020 1528   CHOLHDL 4.4 12/23/2020 1528   LDLCALC 142 (H) 12/23/2020 1528    CBC    Component Value Date/Time   WBC 6.3 11/21/2022 1947   RBC 5.95 (H) 11/21/2022 1947   HGB 13.4 11/21/2022 1947   HGB 12.9 07/30/2022 1559   HCT 42.2 11/21/2022 1947   HCT 41.9 07/30/2022 1559   PLT 376 11/21/2022 1947   PLT 394 07/30/2022 1559   MCV 70.9 (L) 11/21/2022 1947   MCV 71 (L) 07/30/2022 1559   MCH 22.5 (L) 11/21/2022 1947   MCHC 31.8 11/21/2022 1947   RDW 15.4 11/21/2022 1947   RDW 16.0 (H) 07/30/2022 1559   LYMPHSABS 2.4 11/21/2022 1947   LYMPHSABS 1.5 07/30/2022 1559   MONOABS 0.7 11/21/2022 1947   EOSABS 0.3 11/21/2022 1947   EOSABS 0.1 07/30/2022 1559   BASOSABS 0.0 11/21/2022 1947   BASOSABS 0.0 07/30/2022 1559    ASSESSMENT AND PLAN:  There are no diagnoses linked to this encounter.   Patient was given the opportunity to ask questions.  Patient verbalized understanding of the plan and was able to repeat key elements of the plan. Patient was given clear instructions to go to Emergency Department or return to medical center if symptoms don't improve, worsen, or new problems develop.The patient  verbalized understanding.   No orders of the defined types were placed in this encounter.    Requested Prescriptions    No prescriptions requested or ordered in this encounter    No follow-ups on file.  [name], NP

## 2022-11-26 ENCOUNTER — Ambulatory Visit: Payer: BC Managed Care – PPO

## 2022-11-26 ENCOUNTER — Other Ambulatory Visit (HOSPITAL_COMMUNITY)
Admission: RE | Admit: 2022-11-26 | Discharge: 2022-11-26 | Disposition: A | Discharge status: Home / Self Care | Payer: BC Managed Care – PPO | Source: Ambulatory Visit | Attending: Family | Admitting: Family

## 2022-11-26 ENCOUNTER — Ambulatory Visit (INDEPENDENT_AMBULATORY_CARE_PROVIDER_SITE_OTHER): Payer: BC Managed Care – PPO | Admitting: Family

## 2022-11-26 ENCOUNTER — Encounter: Payer: Self-pay | Admitting: Family

## 2022-11-26 VITALS — BP 132/87 | HR 85 | Ht 62.0 in | Wt 192.2 lb

## 2022-11-26 DIAGNOSIS — Z13220 Encounter for screening for lipoid disorders: Secondary | ICD-10-CM

## 2022-11-26 DIAGNOSIS — U071 COVID-19: Secondary | ICD-10-CM | POA: Diagnosis not present

## 2022-11-26 DIAGNOSIS — I10 Essential (primary) hypertension: Secondary | ICD-10-CM

## 2022-11-26 DIAGNOSIS — R7303 Prediabetes: Secondary | ICD-10-CM | POA: Diagnosis not present

## 2022-11-26 DIAGNOSIS — R399 Unspecified symptoms and signs involving the genitourinary system: Secondary | ICD-10-CM

## 2022-11-26 DIAGNOSIS — J1282 Pneumonia due to coronavirus disease 2019: Secondary | ICD-10-CM

## 2022-11-26 LAB — POCT URINALYSIS DIP (CLINITEK)
Bilirubin, UA: NEGATIVE
Blood, UA: NEGATIVE
Glucose, UA: NEGATIVE mg/dL
Ketones, POC UA: NEGATIVE mg/dL
Leukocytes, UA: NEGATIVE
Nitrite, UA: NEGATIVE
POC PROTEIN,UA: >=300 — AB
Spec Grav, UA: >=1.03 — AB (ref 1.010–1.025)
Urobilinogen, UA: 0.2 E.U./dL
pH, UA: 6 (ref 5.0–8.0)

## 2022-11-26 MED ORDER — METFORMIN HCL 500 MG PO TABS: 500.0000 mg | ORAL_TABLET | Freq: Two times a day (BID) | ORAL | 2 refills | Status: DC

## 2022-11-26 MED ORDER — HYDROCHLOROTHIAZIDE 12.5 MG PO TABS: 12.5000 mg | ORAL_TABLET | Freq: Every day | ORAL | 2 refills | Status: DC

## 2022-11-26 MED ORDER — BENZONATATE 100 MG PO CAPS: 100.0000 mg | ORAL_CAPSULE | Freq: Three times a day (TID) | ORAL | 1 refills | Status: DC | PRN

## 2022-11-26 MED ORDER — AMLODIPINE BESYLATE 10 MG PO TABS: 10.0000 mg | ORAL_TABLET | Freq: Every evening | ORAL | 2 refills | Status: DC

## 2022-11-26 NOTE — Patient Instructions (Signed)
[facility]  [phone] [place] [address] [place], [place]

## 2022-11-26 NOTE — Progress Notes (Signed)
Lower abdominal pain while urinating.

## 2022-11-27 LAB — CERVICOVAGINAL ANCILLARY ONLY
Bacterial Vaginitis (gardnerella): NEGATIVE
Candida Glabrata: NEGATIVE
Candida Vaginitis: NEGATIVE
Chlamydia: NEGATIVE
Comment: NEGATIVE
Comment: NEGATIVE
Comment: NEGATIVE
Comment: NEGATIVE
Comment: NEGATIVE
Comment: NORMAL
Neisseria Gonorrhea: NEGATIVE
Trichomonas: NEGATIVE

## 2022-11-27 MED ORDER — EPINEPHRINE 0.3 MG/0.3ML IJ SOAJ: 0.3000 mg | INTRAMUSCULAR | 0 refills | Status: DC | PRN

## 2022-11-29 DIAGNOSIS — R8271 Bacteriuria: Secondary | ICD-10-CM | POA: Diagnosis not present

## 2022-11-29 DIAGNOSIS — Z87442 Personal history of urinary calculi: Secondary | ICD-10-CM | POA: Diagnosis not present

## 2022-11-29 DIAGNOSIS — N201 Calculus of ureter: Secondary | ICD-10-CM | POA: Diagnosis not present

## 2022-12-04 ENCOUNTER — Ambulatory Visit (INDEPENDENT_AMBULATORY_CARE_PROVIDER_SITE_OTHER): Payer: BC Managed Care – PPO

## 2022-12-04 DIAGNOSIS — L501 Idiopathic urticaria: Secondary | ICD-10-CM

## 2022-12-04 DIAGNOSIS — J309 Allergic rhinitis, unspecified: Secondary | ICD-10-CM

## 2022-12-04 DIAGNOSIS — L509 Urticaria, unspecified: Secondary | ICD-10-CM

## 2022-12-05 ENCOUNTER — Telehealth (HOSPITAL_BASED_OUTPATIENT_CLINIC_OR_DEPARTMENT_OTHER): Payer: BC Managed Care – PPO | Admitting: Psychiatry

## 2022-12-05 ENCOUNTER — Encounter (HOSPITAL_COMMUNITY): Payer: Self-pay | Admitting: Psychiatry

## 2022-12-05 DIAGNOSIS — F321 Major depressive disorder, single episode, moderate: Secondary | ICD-10-CM | POA: Diagnosis not present

## 2022-12-05 DIAGNOSIS — F4310 Post-traumatic stress disorder, unspecified: Secondary | ICD-10-CM | POA: Diagnosis not present

## 2022-12-05 MED ORDER — TRAZODONE HCL 100 MG PO TABS: 100.0000 mg | ORAL_TABLET | Freq: Every evening | ORAL | 0 refills | Status: DC | PRN

## 2022-12-05 MED ORDER — LAMOTRIGINE 25 MG PO TABS: ORAL_TABLET | ORAL | 0 refills | Status: DC
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
## 2022-12-13 ENCOUNTER — Ambulatory Visit (INDEPENDENT_AMBULATORY_CARE_PROVIDER_SITE_OTHER): Payer: BC Managed Care – PPO | Admitting: Clinical

## 2022-12-13 DIAGNOSIS — F4310 Post-traumatic stress disorder, unspecified: Secondary | ICD-10-CM

## 2022-12-13 DIAGNOSIS — G478 Other sleep disorders: Secondary | ICD-10-CM | POA: Diagnosis not present

## 2022-12-14 ENCOUNTER — Encounter (HOSPITAL_COMMUNITY): Payer: Self-pay

## 2022-12-14 ENCOUNTER — Encounter (HOSPITAL_COMMUNITY): Payer: Self-pay | Admitting: Clinical
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
## 2022-12-18 DIAGNOSIS — H524 Presbyopia: Secondary | ICD-10-CM | POA: Diagnosis not present

## 2022-12-18 DIAGNOSIS — E119 Type 2 diabetes mellitus without complications: Secondary | ICD-10-CM | POA: Diagnosis not present

## 2022-12-18 LAB — HM DIABETES EYE EXAM

## 2022-12-23 NOTE — Progress Notes (Signed)
Follow Up Note  RE: Brianna Gutierrez MRN: [id number] DOB: July 31, 1976 Date of Office Visit: 12/24/2022  Referring provider: [name], NP Primary care provider: [name], NP  Chief Complaint: No chief complaint on file.  History of Present Illness: I had the pleasure of seeing Brianna Gutierrez for a follow up visit at the [facility] on 12/23/2022. She is a 47 y.o. female, who is being followed for pruritus/urticaria on Xolair, adverse food reaction, allergic rhinitis and shortness of breath. Her previous allergy office visit was on 09/19/2022 with Dr. [name]. Today is a regular follow up visit.  Pruritus/urticaria Past history - Pruritic rash for 1 year lasting 1 day at a time.  No triggers noted.  Occurs a few times per month.  Concerned whether strawberries, bananas and seafood may be contributing to the symptoms.  Saw dermatology virtually in the past with no benefit. 2023 skin prick testing showed: Positive to mold and dog.  Negative to foods. 2023 bloodwork slightly positive to pork and lamb, elevated RBC, AP and ESR. Rash much improved after eliminating pork from diet and taking zyrtec and famotidine. Had a flare after eating a beef jerky. No issues with other beef products. Interim history - 80% improved after first Xolair injection. Rest of bloodwork normal. No trigger found.  Continue proper skin care. Keep track of flares. Continue to avoid pork, lamb and beef jerky. Continue allegra '180mg'$  1-2 tablets twice a day. If symptoms are not controlled or causes drowsiness let us know. Continue Pepcid (famotidine) '20mg'$  twice a day.  Avoid the following potential triggers: alcohol, tight clothing, NSAIDs, hot showers and getting overheated. Hematology for elevated RBC - that sometimes can cause itching. Okay to take benadryl '25mg'$  as needed for itching. Okay to use calamine lotion.  Xolair '300mg'$  given today - continue every 4 weeks.   Other adverse  food reactions, not elsewhere classified, subsequent encounter Past history - 2023 bloodwork positive to pork and lamb. Continue to avoid red meat.   Other allergic rhinitis Past history - Increased rhinitis symptoms recently.  2023 skin prick testing showed: Positive to mold and dog.  Continue environmental control measures.    Shortness of breath Past history - Dyspnea on exertion and uses albuterol as needed with good benefit.  Has been using it daily since prescribed 1 month ago.  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  CT chest in April 2023 was unremarkable. 2023 spirometry showed restriction with 2% improvement FEV1 postbronchodilator treatment.  Clinically feeling improved. Monitor symptoms. Only use albuterol if you have symptoms more than 5 minutes after your stop your activities. DOE is most likely due to physical deconditioning.  May use albuterol rescue inhaler 2 puffs every 4 to 6 hours as needed for shortness of breath, chest tightness, coughing, and wheezing.  Monitor frequency of use.    Return in about 3 months (around 12/20/2022).  Assessment and Plan: Brianna Gutierrez is a 47 y.o. female with: No problem-specific Assessment & Plan notes found for this encounter.  No follow-ups on file.  No orders of the defined types were placed in this encounter.  Lab Orders  No laboratory test(s) ordered today    Diagnostics: Spirometry:  Tracings reviewed. Her effort: {Blank single:19197::"Good reproducible efforts.","It was hard to get consistent efforts and there is a question as to whether this reflects a maximal maneuver.","Poor effort, data can not be interpreted."} FVC: ***L FEV1: ***L, ***% predicted FEV1/FVC  ratio: ***% Interpretation: {Blank single:19197::"Spirometry consistent with mild obstructive disease","Spirometry consistent with moderate obstructive disease","Spirometry consistent with severe obstructive disease","Spirometry consistent with  possible restrictive disease","Spirometry consistent with mixed obstructive and restrictive disease","Spirometry uninterpretable due to technique","Spirometry consistent with normal pattern","No overt abnormalities noted given today's efforts"}.  Please see scanned spirometry results for details.  Skin Testing: {Blank single:19197::"Select foods","Environmental allergy panel","Environmental allergy panel and select foods","Food allergy panel","None","Deferred due to recent antihistamines use"}. *** Results discussed with patient/family.   Medication List:  Current Outpatient Medications  Medication Sig Dispense Refill   acetaminophen (TYLENOL) 500 MG tablet Take 2 tablets (1,000 mg total) by mouth every 6 (six) hours as needed. 30 tablet 0   albuterol (VENTOLIN HFA) 108 (90 Base) MCG/ACT inhaler Inhale 2 puffs into the lungs every 6 (six) hours as needed for wheezing or shortness of breath. 8 g 2   amLODipine (NORVASC) 10 MG tablet Take 1 tablet (10 mg total) by mouth every evening. 30 tablet 2   benzonatate (TESSALON) 100 MG capsule Take 1 capsule (100 mg total) by mouth 3 (three) times daily as needed for cough. 30 capsule 1   EPINEPHrine (EPIPEN 2-PAK) 0.3 mg/0.3 mL IJ SOAJ injection Inject 0.3 mg into the muscle as needed for anaphylaxis. 2 each 0   famotidine (PEPCID) 20 MG tablet Take 1 tablet (20 mg total) by mouth 2 (two) times daily. 60 tablet 3   fexofenadine (ALLEGRA) 180 MG tablet Take 2 tablets twice a day for hives. 120 tablet 2   gabapentin (NEURONTIN) 300 MG capsule Take 1 capsule (300 mg total) by mouth at bedtime. 30 capsule 2   hydrochlorothiazide (HYDRODIURIL) 12.5 MG tablet Take 1 tablet (12.5 mg total) by mouth daily. 30 tablet 2   ibuprofen (ADVIL) 600 MG tablet Take 1 tablet (600 mg total) by mouth every 6 (six) hours as needed. 30 tablet 0   lamoTRIgine (LAMICTAL) 25 MG tablet Take 1 tablet (25 mg total) by mouth daily for 14 days, THEN 2 tablets (50 mg total) daily for  20 days. 70 tablet 0   metFORMIN (GLUCOPHAGE) 500 MG tablet Take 1 tablet (500 mg total) by mouth 2 (two) times daily with a meal. 60 tablet 2   ondansetron (ZOFRAN-ODT) 4 MG disintegrating tablet Take 1 tablet (4 mg total) by mouth every 8 (eight) hours as needed for nausea or vomiting. 20 tablet 0   traZODone (DESYREL) 100 MG tablet Take 1 tablet (100 mg total) by mouth at bedtime as needed for sleep. Take an additional 50 mg (1/2 tab) as needed 135 tablet 0   XOLAIR 150 MG/ML prefilled syringe Inject 300 mg into the skin every 28 (twenty-eight) days.     Current Facility-Administered Medications  Medication Dose Route Frequency Provider Last Rate Last Admin   omalizumab Arvid Right) injection 150 mg  150 mg Subcutaneous Q28 days Garnet Sierras, DO   150 mg at 12/04/22 1537   Allergies: Allergies  Allergen Reactions   Pork-Derived Products    Tramadol     rash   Tramadol Other (See Comments)    Headache   I reviewed her past medical history, social history, family history, and environmental history and no significant changes have been reported from her previous visit.  Review of Systems  Constitutional:  Negative for appetite change, chills, fever and unexpected weight change.  HENT:  Negative for congestion and rhinorrhea.   Eyes:  Negative for itching.  Respiratory:  Negative for cough, chest tightness, shortness of breath and wheezing.  Cardiovascular:  Negative for chest pain.  Gastrointestinal:  Negative for abdominal pain.  Genitourinary:  Negative for difficulty urinating.  Skin:  Positive for rash.  Allergic/Immunologic: Positive for environmental allergies.  Neurological:  Negative for headaches.    Objective: LMP  (LMP Unknown)  There is no height or weight on file to calculate BMI. Physical Exam Vitals and nursing note reviewed.  Constitutional:      Appearance: Normal appearance. She is well-developed. She is obese.  HENT:     Head: Normocephalic and atraumatic.      Right Ear: Tympanic membrane and external ear normal.     Left Ear: Tympanic membrane and external ear normal.     Nose: Nose normal.     Mouth/Throat:     Mouth: Mucous membranes are moist.     Pharynx: Oropharynx is clear.  Eyes:     Conjunctiva/sclera: Conjunctivae normal.  Cardiovascular:     Rate and Rhythm: Normal rate and regular rhythm.     Heart sounds: Normal heart sounds. No murmur heard.    No friction rub. No gallop.  Pulmonary:     Effort: Pulmonary effort is normal.     Breath sounds: Normal breath sounds. No wheezing, rhonchi or rales.  Musculoskeletal:     Cervical back: Neck supple.  Skin:    General: Skin is warm.     Findings: No rash.  Neurological:     Mental Status: She is alert and oriented to person, place, and time.  Psychiatric:        Behavior: Behavior normal.    Previous notes and tests were reviewed. The plan was reviewed with the patient/family, and all questions/concerned were addressed.  It was my pleasure to see Brianna Gutierrez today and participate in her care. Please feel free to contact me with any questions or concerns.  Sincerely,  Rexene Alberts, DO Allergy & Immunology  [facility] office: [place] office: [phone]

## 2022-12-24 ENCOUNTER — Telehealth: Payer: Self-pay

## 2022-12-24 ENCOUNTER — Ambulatory Visit (INDEPENDENT_AMBULATORY_CARE_PROVIDER_SITE_OTHER): Payer: BC Managed Care – PPO | Admitting: Allergy

## 2022-12-24 ENCOUNTER — Other Ambulatory Visit: Payer: Self-pay

## 2022-12-24 ENCOUNTER — Encounter: Payer: Self-pay | Admitting: Allergy

## 2022-12-24 VITALS — BP 140/98 | HR 98 | Temp 98.3°F | Resp 16 | Ht 62.0 in | Wt 188.3 lb

## 2022-12-24 DIAGNOSIS — L501 Idiopathic urticaria: Secondary | ICD-10-CM

## 2022-12-24 DIAGNOSIS — I10 Essential (primary) hypertension: Secondary | ICD-10-CM

## 2022-12-24 DIAGNOSIS — L299 Pruritus, unspecified: Secondary | ICD-10-CM

## 2022-12-24 DIAGNOSIS — R0602 Shortness of breath: Secondary | ICD-10-CM

## 2022-12-24 DIAGNOSIS — J3089 Other allergic rhinitis: Secondary | ICD-10-CM | POA: Diagnosis not present

## 2022-12-24 DIAGNOSIS — T781XXD Other adverse food reactions, not elsewhere classified, subsequent encounter: Secondary | ICD-10-CM

## 2022-12-24 NOTE — Assessment & Plan Note (Signed)
Past history - Pruritic rash for 1 year lasting 1 day at a time.  No triggers noted.  Occurs a few times per month.  Concerned whether strawberries, bananas and seafood may be contributing to the symptoms.  Saw dermatology virtually in the past with no benefit. Interim history - was doing well with Xolair but was late due to Covid-19 infection with pneumonia which flared her hives.  Continue proper skin care. Keep track of flares. Continue allegra '180mg'$  1 tablet once a day.  If symptoms are not controlled or causes drowsiness let us know. Decrease famotidine to '20mg'$  once a day. If you have no symptoms after 2 weeks then stop allegra.  If you have any symptoms let us know. Avoid the following potential triggers: alcohol, tight clothing, NSAIDs, hot showers and getting overheated. Continue Xolair '300mg'$  every 4 weeks.

## 2022-12-24 NOTE — Telephone Encounter (Signed)
Please refer patient to Hematology/Oncology for elevated RBC and pruritus per Dr. [name].

## 2022-12-24 NOTE — Assessment & Plan Note (Signed)
Please follow up with your PCP regarding this.

## 2022-12-24 NOTE — Patient Instructions (Addendum)
Skin: Continue proper skin care. Keep track of flares. Continue to avoid pork, lamb and beef jerky. Continue allegra '180mg'$  1 tablet once a day.  If symptoms are not controlled or causes drowsiness let us know. Decrease famotidine to '20mg'$  once a day. If you have no symptoms after 2 weeks then stop allegra.  If you have any symptoms let us know.  Avoid the following potential triggers: alcohol, tight clothing, NSAIDs, hot showers and getting overheated.  See hematology for your red cells - that sometimes can cause itching.  Continue Xolair '300mg'$  every 4 weeks.   Environmental allergies 2023 skin testing showed: Positive to mold and dog.  Continue environmental control measures.   Shortness of breath Monitor symptoms - if you notice any flares let us know.  Only use albuterol if you have symptoms more than 5 minutes after your stop your activities. May use albuterol rescue inhaler 2 puffs every 4 to 6 hours as needed for shortness of breath, chest tightness, coughing, and wheezing.  Monitor frequency of use.   Follow up in 6 months or sooner if needed.   Skin care recommendations  Bath time: Always use lukewarm water. AVOID very hot or cold water. Keep bathing time to 5-10 minutes. Do NOT use bubble bath. Use a mild soap and use just enough to wash the dirty areas. Do NOT scrub skin vigorously.  After bathing, pat dry your skin with a towel. Do NOT rub or scrub the skin.  Moisturizers and prescriptions:  ALWAYS apply moisturizers immediately after bathing (within 3 minutes). This helps to lock-in moisture. Use the moisturizer several times a day over the whole body. Good summer moisturizers include: Aveeno, CeraVe, Cetaphil. Good winter moisturizers include: Aquaphor, Vaseline, Cerave, Cetaphil, Eucerin, Vanicream. When using moisturizers along with medications, the moisturizer should be applied about one hour after applying the medication to prevent diluting effect of the  medication or moisturize around where you applied the medications. When not using medications, the moisturizer can be continued twice daily as maintenance.  Laundry and clothing: Avoid laundry products with added color or perfumes. Use unscented hypo-allergenic laundry products such as Tide free, Cheer free & gentle, and All free and clear.  If the skin still seems dry or sensitive, you can try double-rinsing the clothes. Avoid tight or scratchy clothing such as wool. Do not use fabric softeners or dyer sheets.   Mold Control Mold and fungi can grow on a variety of surfaces provided certain temperature and moisture conditions exist.  Outdoor molds grow on plants, decaying vegetation and soil. The major outdoor mold, Alternaria and Cladosporium, are found in very high numbers during hot and dry conditions. Generally, a late summer - fall peak is seen for common outdoor fungal spores. Rain will temporarily lower outdoor mold spore count, but counts rise rapidly when the rainy period ends. The most important indoor molds are Aspergillus and Penicillium. Dark, humid and poorly ventilated basements are ideal sites for mold growth. The next most common sites of mold growth are the bathroom and the kitchen. Outdoor (Seasonal) Mold Control Use air conditioning and keep windows closed. Avoid exposure to decaying vegetation. Avoid leaf raking. Avoid grain handling. Consider wearing a face mask if working in moldy areas.  Indoor (Perennial) Mold Control  Maintain humidity below 50%. Get rid of mold growth on hard surfaces with water, detergent and, if necessary, 5% bleach (do not mix with other cleaners). Then dry the area completely. If mold covers an area more than 10  square feet, consider hiring an Patent examiner. For clothing, washing with soap and water is best. If moldy items cannot be cleaned and dried, throw them away. Remove sources e.g. contaminated carpets. Repair and seal  leaking roofs or pipes. Using dehumidifiers in damp basements may be helpful, but empty the water and clean units regularly to prevent mildew from forming. All rooms, especially basements, bathrooms and kitchens, require ventilation and cleaning to deter mold and mildew growth. Avoid carpeting on concrete or damp floors, and storing items in damp areas. Pet Allergen Avoidance: Contrary to popular opinion, there are no "hypoallergenic" breeds of dogs or cats. That is because people are not allergic to an animal's hair, but to an allergen found in the animal's saliva, dander (dead skin flakes) or urine. Pet allergy symptoms typically occur within minutes. For some people, symptoms can build up and become most severe 8 to 12 hours after contact with the animal. People with severe allergies can experience reactions in public places if dander has been transported on the pet owners' clothing. Keeping an animal outdoors is only a partial solution, since homes with pets in the yard still have higher concentrations of animal allergens. Before getting a pet, ask your allergist to determine if you are allergic to animals. If your pet is already considered part of your family, try to minimize contact and keep the pet out of the bedroom and other rooms where you spend a great deal of time. As with dust mites, vacuum carpets often or replace carpet with a hardwood floor, tile or linoleum. High-efficiency particulate air (HEPA) cleaners can reduce allergen levels over time. While dander and saliva are the source of cat and dog allergens, urine is the source of allergens from rabbits, hamsters, mice and [place] pigs; so ask a non-allergic family member to clean the animal's cage. If you have a pet allergy, talk to your allergist about the potential for allergy immunotherapy (allergy shots). This strategy can often provide long-term relief.

## 2022-12-24 NOTE — Assessment & Plan Note (Signed)
Past history - Increased rhinitis symptoms recently.  2023 skin prick testing showed: Positive to mold and dog.  Continue environmental control measures.

## 2022-12-24 NOTE — Assessment & Plan Note (Signed)
Past history - Dyspnea on exertion and uses albuterol as needed with good benefit.  Has been using it daily since prescribed 1 month ago.  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  CT chest in April 2023 was unremarkable. 2023 spirometry showed restriction with 2% improvement FEV1 postbronchodilator treatment.  Clinically feeling improved. Interim history - had to use albuterol with Covid-19 and pneumonia with good benefit.  Monitor symptoms - if you notice any flares let us know.  Only use albuterol if you have symptoms more than 5 minutes after your stop your activities. DOE is most likely due to physical deconditioning.  May use albuterol rescue inhaler 2 puffs every 4 to 6 hours as needed for shortness of breath, chest tightness, coughing, and wheezing.  Monitor frequency of use.  Get spirometry at next visit.

## 2022-12-24 NOTE — Assessment & Plan Note (Signed)
Refer to hematology for elevated RBCs with pruritus.

## 2022-12-24 NOTE — Assessment & Plan Note (Signed)
Past history - 2023 bloodwork positive to pork and lamb. Continue to avoid red meat.

## 2022-12-27 MED ORDER — OMALIZUMAB 150 MG/ML ~~LOC~~ SOSY
300.0000 mg | PREFILLED_SYRINGE | SUBCUTANEOUS | Status: AC
  Administered 2022-12-04 – 2024-11-03 (×20): 300 mg via SUBCUTANEOUS

## 2022-12-27 NOTE — Addendum Note (Signed)
Addended by: Carin Hock on: 12/27/2022 09:16 AM   Modules accepted: Orders

## 2022-12-31 ENCOUNTER — Ambulatory Visit (INDEPENDENT_AMBULATORY_CARE_PROVIDER_SITE_OTHER): Payer: BC Managed Care – PPO | Admitting: *Deleted

## 2022-12-31 DIAGNOSIS — L501 Idiopathic urticaria: Secondary | ICD-10-CM

## 2023-01-01 ENCOUNTER — Ambulatory Visit: Payer: BC Managed Care – PPO

## 2023-01-03 ENCOUNTER — Ambulatory Visit (INDEPENDENT_AMBULATORY_CARE_PROVIDER_SITE_OTHER): Payer: BC Managed Care – PPO | Admitting: Clinical

## 2023-01-03 ENCOUNTER — Telehealth (HOSPITAL_COMMUNITY): Payer: BC Managed Care – PPO | Admitting: Psychiatry

## 2023-01-03 DIAGNOSIS — F4310 Post-traumatic stress disorder, unspecified: Secondary | ICD-10-CM

## 2023-01-03 DIAGNOSIS — G478 Other sleep disorders: Secondary | ICD-10-CM | POA: Diagnosis not present

## 2023-01-03 DIAGNOSIS — F321 Major depressive disorder, single episode, moderate: Secondary | ICD-10-CM | POA: Diagnosis not present

## 2023-01-03 NOTE — Progress Notes (Signed)
THERAPIST PROGRESS NOTE  Session Time: 4:01-5:00pm  Participation Level: Active  Behavioral Response: CasualAlertDysphoric  Type of Therapy: Individual Therapy  Treatment Goals addressed:  STG: Fiorella "Toya" will practice emotion regulation skills 3 time(s) per week for the next 26 week(s)   LTG: Branae "[name]" will verbalize an increased sense of mastery over PTSD symptoms by using several techniques to cope with flashbacks, decrease the power of triggers, and decrease negative thinking   LTG: Sleeping patterns will improve   STG: Shardee "[name]" will demonstrate understanding of need for keeping a regular sleep schedule      STG:  Patient will be in control of what, how much, and when to eat     ProgressTowards Goals: Progressing  Interventions: Strength-based, Psychosocial Skills: journaling, and Supportive  Summary: Doritha Deshazor Ronnald Ramp is a 47 y.o. female who presents with trauma for therapy.  One of her traumas was the loss of her 10yo son to suicide.  She informed CSW that one of her childhood friends had a daughter who was shot and killed by an ex-boyfriend last week.  We discussed the impact of such a loss and the fact that each parent's is different.  As such, she feels she is unable to do anything to help.  CSW informed her that hearing their child's name and talking about them can be helpful.  CSW then asked her son's name and for a description, which she freely gave while smiling.  Ernie was a special child who suffered from seizures starting at age 58yo and from ear infections throughout his life that led to deafness in one ear.  It took him a long time stop bedwetting.  He was picked on in school which ultimately led to his suicide.  At that point she started crying, saying "they killed my son."    Once recuperated, she talked about what is going on with her.  She has been moody lately and is still getting up at night to eat, sometimes being aware that she is doing so and  sometimes not realizing until the next morning when she finds food in the bed.  Sometimes she is aware that she is eating but it feels like she is dreaming.  At other times she is awakened by dreams and goes to eat.  She has started putting on weight from when she previously lost it while sick.  She is able to recognize that she might be consciously and unconsciously self-soothing with food and that it is not healthy for her.  She was agreeable to taking a small journal offered and keeping it by her bedside to [place] down her thoughts and/or dreams when she wakes up.  She liked the idea of starting to journal again.  She also shared that she has become a person who is not sociable and likes spending time alone.  She talks to herself a lot but is assuring that she does not hear voices.  She used to pull her hair out, but now she just self-soothes by playing with her braids even in her sleep.  She stated she "zones out" sometimes while doing things and that often people will know this has happened because she starts self-soothing with her hair.  This happened two times during session.   Something that is really bothering the patient is that she has anger that she cannot explain, will wake up angry and it remains with her throughout the day.  At her current job people know to leave her  alone, but at past jobs she has been fired for this.  She stated her moods change in an instant and she would simply like more control.  Her "zoning out" does happen more on those bad days.  Suicidal/Homicidal: No  Therapist Response: Patient was open and participated well throughout the session.  In that way it can be said she is progressing.  She was agreeable to starting to journal again as well as to jot down what is in her mind when she wakes up in order to try to identify any way in which those thoughts might be leading to the binge-eating.  She did zone out several times while CSW was talking about coping skills.     Recommendations:  Return to therapy in 2 weeks, engage in self care behaviors, plan positive social engagements, restart journaling, try to jot down dreams/thoughts when awakened in the middle of the night  Plan: Return again in 2 weeks.  Diagnosis:  PTSD (post-traumatic stress disorder)  Sleep related eating disorder  Major depressive disorder, single episode, moderate ([place])   Collaboration of Care: Psychiatrist AEB - psychiatric provider can read therapy notes  Patient/Guardian was advised Release of Information must be obtained prior to any record release in order to collaborate their care with an outside provider. Patient/Guardian was advised if they have not already done so to contact the registration department to sign all necessary forms in order for [place] to release information regarding their care.   Consent: Patient/Guardian gives verbal consent for treatment and assignment of benefits for services provided during this visit. Patient/Guardian expressed understanding and agreed to proceed.   Maretta Los, LCSW 01/03/2023

## 2023-01-10 ENCOUNTER — Telehealth (HOSPITAL_BASED_OUTPATIENT_CLINIC_OR_DEPARTMENT_OTHER): Payer: BC Managed Care – PPO | Admitting: Psychiatry

## 2023-01-10 ENCOUNTER — Encounter (HOSPITAL_COMMUNITY): Payer: Self-pay | Admitting: Psychiatry

## 2023-01-10 DIAGNOSIS — K769 Liver disease, unspecified: Secondary | ICD-10-CM | POA: Diagnosis not present

## 2023-01-10 DIAGNOSIS — F4310 Post-traumatic stress disorder, unspecified: Secondary | ICD-10-CM | POA: Diagnosis not present

## 2023-01-10 DIAGNOSIS — R109 Unspecified abdominal pain: Secondary | ICD-10-CM | POA: Diagnosis not present

## 2023-01-10 DIAGNOSIS — R1084 Generalized abdominal pain: Secondary | ICD-10-CM | POA: Diagnosis not present

## 2023-01-10 DIAGNOSIS — F321 Major depressive disorder, single episode, moderate: Secondary | ICD-10-CM | POA: Diagnosis not present

## 2023-01-10 MED ORDER — LAMOTRIGINE 100 MG PO TABS: 100.0000 mg | ORAL_TABLET | Freq: Every day | ORAL | 1 refills | Status: DC
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
## 2023-01-15 ENCOUNTER — Telehealth: Payer: Self-pay | Admitting: Physician Assistant

## 2023-01-15 NOTE — Telephone Encounter (Signed)
scheduled per 3/18 referral , pt has been called and confirmed date and time. Pt is aware of location and to arrive early for check in

## 2023-01-17 ENCOUNTER — Ambulatory Visit (INDEPENDENT_AMBULATORY_CARE_PROVIDER_SITE_OTHER): Payer: BC Managed Care – PPO | Admitting: Clinical

## 2023-01-17 ENCOUNTER — Encounter (HOSPITAL_COMMUNITY): Payer: Self-pay | Admitting: Clinical

## 2023-01-17 DIAGNOSIS — F4310 Post-traumatic stress disorder, unspecified: Secondary | ICD-10-CM

## 2023-01-17 DIAGNOSIS — F321 Major depressive disorder, single episode, moderate: Secondary | ICD-10-CM | POA: Diagnosis not present
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
## 2023-01-28 ENCOUNTER — Ambulatory Visit (INDEPENDENT_AMBULATORY_CARE_PROVIDER_SITE_OTHER): Payer: BC Managed Care – PPO | Admitting: *Deleted

## 2023-01-28 DIAGNOSIS — L501 Idiopathic urticaria: Secondary | ICD-10-CM | POA: Diagnosis not present

## 2023-01-31 ENCOUNTER — Ambulatory Visit (INDEPENDENT_AMBULATORY_CARE_PROVIDER_SITE_OTHER): Payer: BC Managed Care – PPO | Admitting: Clinical

## 2023-01-31 ENCOUNTER — Encounter (HOSPITAL_COMMUNITY): Payer: Self-pay | Admitting: Clinical

## 2023-01-31 DIAGNOSIS — F331 Major depressive disorder, recurrent, moderate: Secondary | ICD-10-CM | POA: Diagnosis not present

## 2023-01-31 DIAGNOSIS — F4310 Post-traumatic stress disorder, unspecified: Secondary | ICD-10-CM | POA: Diagnosis not present
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
## 2023-02-01 ENCOUNTER — Encounter: Payer: Self-pay | Admitting: Family

## 2023-02-03 ENCOUNTER — Emergency Department (HOSPITAL_COMMUNITY): Payer: BC Managed Care – PPO

## 2023-02-03 ENCOUNTER — Encounter (HOSPITAL_COMMUNITY): Payer: Self-pay

## 2023-02-03 ENCOUNTER — Emergency Department (HOSPITAL_COMMUNITY)
Admission: EM | Admit: 2023-02-03 | Discharge: 2023-02-03 | Disposition: A | Discharge status: Home / Self Care | Payer: BC Managed Care – PPO | Attending: Emergency Medicine | Admitting: Emergency Medicine

## 2023-02-03 ENCOUNTER — Other Ambulatory Visit: Payer: Self-pay

## 2023-02-03 DIAGNOSIS — M5441 Lumbago with sciatica, right side: Secondary | ICD-10-CM | POA: Diagnosis not present

## 2023-02-03 DIAGNOSIS — Z720 Tobacco use: Secondary | ICD-10-CM | POA: Diagnosis not present

## 2023-02-03 DIAGNOSIS — M5442 Lumbago with sciatica, left side: Secondary | ICD-10-CM | POA: Diagnosis not present

## 2023-02-03 DIAGNOSIS — R109 Unspecified abdominal pain: Secondary | ICD-10-CM | POA: Diagnosis not present

## 2023-02-03 DIAGNOSIS — I10 Essential (primary) hypertension: Secondary | ICD-10-CM | POA: Insufficient documentation

## 2023-02-03 DIAGNOSIS — Z79899 Other long term (current) drug therapy: Secondary | ICD-10-CM | POA: Insufficient documentation

## 2023-02-03 DIAGNOSIS — M545 Low back pain, unspecified: Secondary | ICD-10-CM

## 2023-02-03 DIAGNOSIS — M5432 Sciatica, left side: Secondary | ICD-10-CM

## 2023-02-03 LAB — CBC
HCT: 43.7 % (ref 36.0–46.0)
Hemoglobin: 13.5 g/dL (ref 12.0–15.0)
MCH: 22.4 pg — ABNORMAL LOW (ref 26.0–34.0)
MCHC: 30.9 g/dL (ref 30.0–36.0)
MCV: 72.6 fL — ABNORMAL LOW (ref 80.0–100.0)
Platelets: 363 10*3/uL (ref 150–400)
RBC: 6.02 MIL/uL — ABNORMAL HIGH (ref 3.87–5.11)
RDW: 17.6 % — ABNORMAL HIGH (ref 11.5–15.5)
WBC: 7 10*3/uL (ref 4.0–10.5)
nRBC: 0 % (ref 0.0–0.2)

## 2023-02-03 LAB — COMPREHENSIVE METABOLIC PANEL
ALT: 15 U/L (ref 0–44)
AST: 12 U/L — ABNORMAL LOW (ref 15–41)
Albumin: 3.6 g/dL (ref 3.5–5.0)
Alkaline Phosphatase: 94 U/L (ref 38–126)
Anion gap: 8 (ref 5–15)
BUN: 12 mg/dL (ref 6–20)
CO2: 24 mmol/L (ref 22–32)
Calcium: 8.4 mg/dL — ABNORMAL LOW (ref 8.9–10.3)
Chloride: 106 mmol/L (ref 98–111)
Creatinine, Ser: 0.81 mg/dL (ref 0.44–1.00)
GFR, Estimated: >60 mL/min (ref >60)
Glucose, Bld: 95 mg/dL (ref 70–99)
Potassium: 3.7 mmol/L (ref 3.5–5.1)
Sodium: 138 mmol/L (ref 135–145)
Total Bilirubin: 0.4 mg/dL (ref 0.3–1.2)
Total Protein: 7.4 g/dL (ref 6.5–8.1)

## 2023-02-03 LAB — URINALYSIS, ROUTINE W REFLEX MICROSCOPIC
Bilirubin Urine: NEGATIVE
Glucose, UA: NEGATIVE mg/dL
Hgb urine dipstick: NEGATIVE
Ketones, ur: NEGATIVE mg/dL
Leukocytes,Ua: NEGATIVE
Nitrite: NEGATIVE
Protein, ur: 100 mg/dL — AB
Specific Gravity, Urine: 1.02 (ref 1.005–1.030)
pH: 5 (ref 5.0–8.0)

## 2023-02-03 LAB — LIPASE, BLOOD: Lipase: 28 U/L (ref 11–51)

## 2023-02-03 MED ORDER — METHOCARBAMOL 500 MG PO TABS: 500.0000 mg | ORAL_TABLET | Freq: Two times a day (BID) | ORAL | 0 refills | Status: DC

## 2023-02-03 MED ORDER — KETOROLAC TROMETHAMINE 15 MG/ML IJ SOLN
15.0000 mg | Freq: Once | INTRAMUSCULAR | Status: AC
  Administered 2023-02-03: 15 mg via INTRAVENOUS
  Filled 2023-02-03: qty 1

## 2023-02-03 MED ORDER — MORPHINE SULFATE (PF) 4 MG/ML IV SOLN
4.0000 mg | Freq: Once | INTRAVENOUS | Status: AC
  Administered 2023-02-03: 4 mg via INTRAVENOUS
  Filled 2023-02-03: qty 1

## 2023-02-03 NOTE — ED Triage Notes (Signed)
Patient reports left sided flank pain for a few days, endorses decrease urine production. Has had kidney stone in the past.

## 2023-02-03 NOTE — ED Notes (Signed)
Patient taken to CT at this time.

## 2023-02-03 NOTE — Discharge Instructions (Signed)
Please use Tylenol or ibuprofen for pain.  You may use 600 mg ibuprofen every 6 hours or 1000 mg of Tylenol every 6 hours.  You may choose to alternate between the 2.  This would be most effective.  Not to exceed 4 g of Tylenol within 24 hours.  Not to exceed 3200 mg ibuprofen 24 hours.  You can use the muscle relaxant I am prescribing in addition to the above to help with any breakthrough pain.  You can take it up to twice daily.  It is safe to take at night, but I would be cautious taking it during the day as it can cause some drowsiness.  Make sure that you are feeling awake and alert before you get behind the wheel of a car or operate a motor vehicle.  It is not a narcotic pain medication so you are able to take it if it is not making you drowsy and still pilot a vehicle or machinery safely.  

## 2023-02-03 NOTE — ED Notes (Signed)
Patient assisted to use restroom.  Steady gait noted.

## 2023-02-03 NOTE — ED Provider Notes (Signed)
[place] EMERGENCY DEPARTMENT AT [facility] Provider Note   CSN: [id number] Arrival date & time: 02/03/23  1901     History {Add pertinent medical, surgical, social history, OB history to HPI:1} Chief Complaint  Patient presents with   Flank Pain    Brianna Gutierrez Brianna Gutierrez is a 47 y.o. female with past medical history significant for previous nephrolithiasis, tobacco use, hypertension who presents with concern for left-sided flank pain, low back pain for the last several days, she reports decreased urine production.  She denies any hematuria, dysuria.  She reports it feels slightly different from previous kidney stone, she initially thought it was secondary to sleeping on her bed wrong, but reports that she was thinking that it would probably be bilateral if this was the case.  She denies any fever, chills, denies history of IV drug use, chronic corticosteroid use, previous cancer.   Flank Pain       Home Medications Prior to Admission medications   Medication Sig Start Date End Date Taking? Authorizing Provider  acetaminophen (TYLENOL) 500 MG tablet Take 2 tablets (1,000 mg total) by mouth every 6 (six) hours as needed. 11/15/22   Gailen Shelter, PA  albuterol (VENTOLIN HFA) 108 (90 Base) MCG/ACT inhaler Inhale 2 puffs into the lungs every 6 (six) hours as needed for wheezing or shortness of breath. 02/14/22   Cobb, Ruby Cola, NP  amLODipine (NORVASC) 10 MG tablet Take 1 tablet (10 mg total) by mouth every evening. 11/26/22 02/24/23  [name], NP  EPINEPHrine (EPIPEN 2-PAK) 0.3 mg/0.3 mL IJ SOAJ injection Inject 0.3 mg into the muscle as needed for anaphylaxis. 11/27/22   [name], NP  famotidine (PEPCID) 20 MG tablet Take 1 tablet (20 mg total) by mouth 2 (two) times daily. 04/18/22   Ellamae Sia, DO  fexofenadine (ALLEGRA) 180 MG tablet Take 2 tablets twice a day for hives. 08/21/22   Ellamae Sia, DO  gabapentin (NEURONTIN) 300 MG capsule Take 1 capsule (300  mg total) by mouth at bedtime. 07/30/22   [name], NP  hydrochlorothiazide (HYDRODIURIL) 12.5 MG tablet Take 1 tablet (12.5 mg total) by mouth daily. 11/26/22 02/24/23  [name], NP  lamoTRIgine (LAMICTAL) 100 MG tablet Take 1 tablet (100 mg total) by mouth daily. 01/10/23 03/11/23  Stasia Cavalier, MD  metFORMIN (GLUCOPHAGE) 500 MG tablet Take 1 tablet (500 mg total) by mouth 2 (two) times daily with a meal. 11/26/22 02/24/23  [name], NP  ondansetron (ZOFRAN-ODT) 4 MG disintegrating tablet Take 1 tablet (4 mg total) by mouth every 8 (eight) hours as needed for nausea or vomiting. 11/15/22   Gailen Shelter, PA  traZODone (DESYREL) 100 MG tablet Take 1 tablet (100 mg total) by mouth at bedtime as needed for sleep. Take an additional 50 mg (1/2 tab) as needed 12/05/22   Stasia Cavalier, MD  Geoffry Paradise 150 MG/ML prefilled syringe Inject 300 mg into the skin every 28 (twenty-eight) days. 08/27/22   [provider]  dicyclomine (BENTYL) 20 MG tablet Take 1 tablet (20 mg total) by mouth 2 (two) times daily. 10/29/19 08/24/20  Belinda Fisher, PA-C  omeprazole (PRILOSEC) 20 MG capsule Take 1 capsule (20 mg total) by mouth daily. 01/01/19 08/07/19  Bing Neighbors, NP      Allergies    Pork-derived products, Tramadol, and Tramadol    Review of Systems   Review of Systems  Genitourinary:  Positive for flank  pain.  All other systems reviewed and are negative.   Physical Exam Updated Vital Signs BP (!) 186/116 (BP Location: Right Arm)   Pulse 87   Temp 98.4 F (36.9 C) (Oral)   Resp 18   Ht 5\' 2"  (1.575 m)   Wt 85.3 kg   LMP  (LMP Unknown)   SpO2 95%   BMI 34.39 kg/m  Physical Exam Vitals and nursing note reviewed.  Constitutional:      General: She is not in acute distress.    Appearance: Normal appearance.  HENT:     Head: Normocephalic and atraumatic.  Eyes:     General:        Right eye: No discharge.        Left eye: No discharge.  Cardiovascular:     Rate and  Rhythm: Normal rate and regular rhythm.     Heart sounds: No murmur heard.    No friction rub. No gallop.  Pulmonary:     Effort: Pulmonary effort is normal.     Breath sounds: Normal breath sounds.  Abdominal:     General: Bowel sounds are normal.     Palpations: Abdomen is soft.  Musculoskeletal:     Comments: Patient with some tenderness palpation lumbar paraspinous muscles, no significant radiation to left flank, or groin.  She seems to have a positive straight leg raise on the left, she has no CVA tenderness on the left.  Intact from 5/5 bilateral lower extremities.  She is able to ambulate without difficulty  Skin:    General: Skin is warm and dry.     Capillary Refill: Capillary refill takes less than 2 seconds.  Neurological:     Mental Status: She is alert and oriented to person, place, and time.  Psychiatric:        Mood and Affect: Mood normal.        Behavior: Behavior normal.     ED Results / Procedures / Treatments   Labs (all labs ordered are listed, but only abnormal results are displayed) Labs Reviewed  LIPASE, BLOOD  COMPREHENSIVE METABOLIC PANEL  CBC  URINALYSIS, ROUTINE W REFLEX MICROSCOPIC    EKG None  Radiology No results found.  Procedures Procedures  {Document cardiac monitor, telemetry assessment procedure when appropriate:1}  Medications Ordered in ED Medications - No data to display  ED Course/ Medical Decision Making/ A&P   {   Click here for ABCD2, HEART and other calculatorsREFRESH Note before signing :1}                          Medical Decision Making Amount and/or Complexity of Data Reviewed Labs: ordered. Radiology: ordered.  Risk Prescription drug management.   This patient is a 47 y.o. female  who presents to the ED for concern of left sided low back pain vs flank pain.   Differential diagnoses prior to evaluation: The emergent differential diagnosis includes, but is not limited to,  AAA, renal artery/vein  embolism/thrombosis, mesenteric ischemia, pyelonephritis, nephrolithiasis, cystitis, biliary colic, pancreatitis, perforated peptic ulcer, appendicitis, diverticulitis, bowel obstruction. . This is not an exhaustive differential.   Past Medical History / Co-morbidities: previous nephrolithiasis, tobacco use, hypertension   Physical Exam: Physical exam performed. The pertinent findings include: On my exam patient with some left lumbar paraspinous muscle tenderness, she has positive straight leg raise on the left.  She is somewhat hypertensive on arrival in the emergency department, blood pressure 186/116.  No  CVA tenderness on the left.  No radiation of pain into the anterior abdomen, groin.  Intact strength 5/5 bilateral upper and lower extremities.  Lab Tests/Imaging studies: I personally interpreted labs/imaging and the pertinent results include: Her UA with some protein, rare bacteria but no evidence of hemoglobin.  CBC is overall unremarkable, slight microcytic quality of red blood cells without anemia.  CMP***, lipase***.  Independently interpreted CT renal stone today which shows no evidence of acute intra ureteral stone on the left.  No hydronephrosis or other explanation for her left-sided flank pain.Marland Kitchen. ***I agree with the radiologist interpretation.  Cardiac monitoring: EKG obtained and interpreted by my attending physician which shows: Sinus rhythm, borderline T wave abnormalities in anterior leads.  She is not having any chest pain, shortness of breath at this time.   Medications: I ordered medication including Toradol, morphine for pain.  I have reviewed the patients home medicines and have made adjustments as needed.   Disposition: After consideration of the diagnostic results and the patients response to treatment, I feel that patient symptoms seem consistent with lumbar strain or other low back pain.  Encouraged rehab exercises, ibuprofen, Tylenol, muscle relaxant, and orthopedic  follow-up.   emergency department workup does not suggest an emergent condition requiring admission or immediate intervention beyond what has been performed at this time. The plan is: as above. The patient is safe for discharge and has been instructed to return immediately for worsening symptoms, change in symptoms or any other concerns.  Final Clinical Impression(s) / ED Diagnoses Final diagnoses:  None    Rx / DC Orders ED Discharge Orders     None

## 2023-02-06 ENCOUNTER — Inpatient Hospital Stay: Payer: BC Managed Care – PPO | Attending: Physician Assistant | Admitting: Physician Assistant

## 2023-02-06 ENCOUNTER — Inpatient Hospital Stay: Payer: BC Managed Care – PPO

## 2023-02-06 ENCOUNTER — Encounter: Payer: Self-pay | Admitting: Physician Assistant

## 2023-02-06 ENCOUNTER — Other Ambulatory Visit: Payer: Self-pay

## 2023-02-06 VITALS — BP 155/94 | HR 86 | Temp 97.5°F | Resp 18 | Ht 62.0 in | Wt 197.1 lb

## 2023-02-06 DIAGNOSIS — R718 Other abnormality of red blood cells: Secondary | ICD-10-CM

## 2023-02-06 DIAGNOSIS — F17210 Nicotine dependence, cigarettes, uncomplicated: Secondary | ICD-10-CM | POA: Insufficient documentation

## 2023-02-06 DIAGNOSIS — I10 Essential (primary) hypertension: Secondary | ICD-10-CM | POA: Insufficient documentation

## 2023-02-06 DIAGNOSIS — D751 Secondary polycythemia: Secondary | ICD-10-CM | POA: Diagnosis not present

## 2023-02-06 LAB — FERRITIN: Ferritin: 19 ng/mL (ref 11–307)

## 2023-02-06 LAB — CBC WITH DIFFERENTIAL (CANCER CENTER ONLY)
Abs Immature Granulocytes: 0.02 K/uL (ref 0.00–0.07)
Basophils Absolute: 0 K/uL (ref 0.0–0.1)
Basophils Relative: 0 %
Eosinophils Absolute: 0.3 K/uL (ref 0.0–0.5)
Eosinophils Relative: 4 %
HCT: 43.1 % (ref 36.0–46.0)
Hemoglobin: 13.5 g/dL (ref 12.0–15.0)
Immature Granulocytes: 0 %
Lymphocytes Relative: 21 %
Lymphs Abs: 1.4 K/uL (ref 0.7–4.0)
MCH: 22.5 pg — ABNORMAL LOW (ref 26.0–34.0)
MCHC: 31.3 g/dL (ref 30.0–36.0)
MCV: 71.7 fL — ABNORMAL LOW (ref 80.0–100.0)
Monocytes Absolute: 0.7 K/uL (ref 0.1–1.0)
Monocytes Relative: 10 %
Neutro Abs: 4.4 K/uL (ref 1.7–7.7)
Neutrophils Relative %: 65 %
Platelet Count: 338 K/uL (ref 150–400)
RBC: 6.01 MIL/uL — ABNORMAL HIGH (ref 3.87–5.11)
RDW: 16.8 % — ABNORMAL HIGH (ref 11.5–15.5)
WBC Count: 6.7 K/uL (ref 4.0–10.5)
nRBC: 0 % (ref 0.0–0.2)

## 2023-02-06 LAB — IRON AND IRON BINDING CAPACITY (CC-WL,HP ONLY)
Iron: 92 ug/dL (ref 28–170)
Saturation Ratios: 18 % (ref 10.4–31.8)
TIBC: 505 ug/dL — ABNORMAL HIGH (ref 250–450)
UIBC: 413 ug/dL (ref 148–442)

## 2023-02-06 LAB — CMP (CANCER CENTER ONLY)
ALT: 13 U/L (ref 0–44)
AST: 9 U/L — ABNORMAL LOW (ref 15–41)
Albumin: 3.9 g/dL (ref 3.5–5.0)
Alkaline Phosphatase: 102 U/L (ref 38–126)
Anion gap: 4 — ABNORMAL LOW (ref 5–15)
BUN: 12 mg/dL (ref 6–20)
CO2: 29 mmol/L (ref 22–32)
Calcium: 9.1 mg/dL (ref 8.9–10.3)
Chloride: 105 mmol/L (ref 98–111)
Creatinine: 0.78 mg/dL (ref 0.44–1.00)
GFR, Estimated: >60 mL/min (ref >60)
Glucose, Bld: 92 mg/dL (ref 70–99)
Potassium: 4.5 mmol/L (ref 3.5–5.1)
Sodium: 138 mmol/L (ref 135–145)
Total Bilirubin: 0.3 mg/dL (ref 0.3–1.2)
Total Protein: 7.6 g/dL (ref 6.5–8.1)

## 2023-02-06 NOTE — Progress Notes (Signed)
[facility] [phone]   [phone]  INITIAL CONSULT NOTE  Patient Care Team: [name], NP as PCP - General (Nurse Practitioner) [name], NP (Nurse Practitioner)  CHIEF COMPLAINTS/PURPOSE OF CONSULTATION:  Erythrocytosis Microcytosis  HISTORY OF PRESENTING ILLNESS:  Analeah Deshazor Yetta Barre 47 y.o. female with medical history significant for tobacco abuse, OSA, hypertension and pre-diabetes presents to the clinic for evaluation of erythrocytosis and microcytosis. She is unaccompanied for this visit.   On review of the previous records, there is evidence of erythrocytosis with microcytosis since March 2023. Most recent labs  On exam today, Ms. Yetta Barre reports her energy levels are fairly stable. She continues to complete all her ADLs independently. She denies any appetite or weight changes. She denies any GI symptoms including nausea, vomiting, diarrhea or constipation. She denies easy bruising or signs of active bleeding. She denies fevers, chills, sweats, shortness of breath, chest pain or cough. She has no other complaints. Rest of the 10 point ROS is below.   MEDICAL HISTORY:  Past Medical History:  Diagnosis Date   Abnormal Pap smear of cervix    Anxiety    Depression    Dysmenorrhea    Eczema    History of COVID-19 10/24/2020   loss of taste and smellbody aches and cough x 4 days all symptoms resolved   History of kidney stones    Hypertension    Pre-diabetes    PTSD (post-traumatic stress disorder)    Rash 08/31/2021   comes and goes on legs saw dermetology no rash now ? food allergy   SUI (stress urinary incontinence, female)    Trichomonas infection 02/2021   Uterine fibroid     SURGICAL HISTORY: Past Surgical History:  Procedure Laterality Date   BLADDER SUSPENSION N/A 09/11/2021   Procedure: TRANSVAGINAL TAPE (TVT) PROCEDURE;  Surgeon: Patton Salles, MD;  Location: [facility];  Service:  Gynecology;  Laterality: N/A;   CESAREAN SECTION  1997   x 1   CHOLECYSTECTOMY N/A 01/14/2019   Procedure: LAPAROSCOPIC CHOLECYSTECTOMY WITH POSSIBLE  INTRAOPERATIVE CHOLANGIOGRAM;  Surgeon: Darnell Level, MD;  Location: [facility];  Service: General;  Laterality: N/A;   CYSTOSCOPY N/A 09/11/2021   Procedure: Bluford Kaufmann;  Surgeon: Patton Salles, MD;  Location: [facility];  Service: Gynecology;  Laterality: N/A;   CYSTOSCOPY/URETEROSCOPY/HOLMIUM LASER Left 01/31/2022   Procedure: LEFT URETEROSCOPY/HOLMIUM LASER;  Surgeon: Despina Arias, MD;  Location: [facility];  Service: Urology;  Laterality: Left;   DILITATION & CURRETTAGE/HYSTROSCOPY WITH HYDROTHERMAL ABLATION N/A 06/22/2016   Procedure: DILATATION & CURETTAGE/HYSTEROSCOPY WITH HYDROTHERMAL ABLATION;  Surgeon: Brock Bad, MD;  Location: [facility];  Service: Gynecology;  Laterality: N/A;   TOTAL LAPAROSCOPIC HYSTERECTOMY WITH SALPINGECTOMY Bilateral 09/11/2021   Procedure: TOTAL LAPAROSCOPIC HYSTERECTOMY WITH SALPINGECTOMY;  Surgeon: Patton Salles, MD;  Location[facility];  Service: Gynecology;  Laterality: Bilateral;   TUBAL LIGATION     09-28-2015    SOCIAL HISTORY: Social History   Socioeconomic History   Marital status: Married    Spouse name: Not on file   Number of children: Not on file   Years of education: Not on file   Highest education level: Not on file  Occupational History   Not on file  Tobacco Use   Smoking status: Every Day    Packs/day: 0.50    Years: 20.00    Additional pack years: 0.00  Total pack years: 10.00    Types: Cigarettes    Passive exposure: Never   Smokeless tobacco: Never  Vaping Use   Vaping Use: Former  Substance and Sexual Activity   Alcohol use: Not Currently   Drug use: Never   Sexual activity: Not Currently    Partners: Male    Birth control/protection: Surgical    Comment: TLH 09/11/21  Other Topics Concern   Not on file   Social History Narrative   ** Merged History Encounter **       Social Determinants of Health   Financial Resource Strain: Not on file  Food Insecurity: Not on file  Transportation Needs: Not on file  Physical Activity: Not on file  Stress: Not on file  Social Connections: Not on file  Intimate Partner Violence: Not on file    FAMILY HISTORY: Family History  Problem Relation Age of Onset   Hypertension Maternal Grandmother    Diabetes Maternal Grandfather    Allergic rhinitis Daughter    Allergic rhinitis Daughter    Allergic rhinitis Son    Allergic rhinitis Son    Colon cancer Neg Hx    Esophageal cancer Neg Hx    Rectal cancer Neg Hx    Stomach cancer Neg Hx     ALLERGIES:  is allergic to pork-derived products, tramadol, and tramadol.  MEDICATIONS:  Current Outpatient Medications  Medication Sig Dispense Refill   acetaminophen (TYLENOL) 500 MG tablet Take 2 tablets (1,000 mg total) by mouth every 6 (six) hours as needed. 30 tablet 0   albuterol (VENTOLIN HFA) 108 (90 Base) MCG/ACT inhaler Inhale 2 puffs into the lungs every 6 (six) hours as needed for wheezing or shortness of breath. 8 g 2   amLODipine (NORVASC) 10 MG tablet Take 1 tablet (10 mg total) by mouth every evening. 30 tablet 2   EPINEPHrine (EPIPEN 2-PAK) 0.3 mg/0.3 mL IJ SOAJ injection Inject 0.3 mg into the muscle as needed for anaphylaxis. 2 each 0   famotidine (PEPCID) 20 MG tablet Take 1 tablet (20 mg total) by mouth 2 (two) times daily. 60 tablet 3   fexofenadine (ALLEGRA) 180 MG tablet Take 2 tablets twice a day for hives. 120 tablet 2   gabapentin (NEURONTIN) 300 MG capsule Take 1 capsule (300 mg total) by mouth at bedtime. 30 capsule 2   hydrochlorothiazide (HYDRODIURIL) 12.5 MG tablet Take 1 tablet (12.5 mg total) by mouth daily. 30 tablet 2   lamoTRIgine (LAMICTAL) 100 MG tablet Take 1 tablet (100 mg total) by mouth daily. 30 tablet 1   metFORMIN (GLUCOPHAGE) 500 MG tablet Take 1 tablet (500 mg  total) by mouth 2 (two) times daily with a meal. 60 tablet 2   methocarbamol (ROBAXIN) 500 MG tablet Take 1 tablet (500 mg total) by mouth 2 (two) times daily. 20 tablet 0   ondansetron (ZOFRAN-ODT) 4 MG disintegrating tablet Take 1 tablet (4 mg total) by mouth every 8 (eight) hours as needed for nausea or vomiting. 20 tablet 0   traZODone (DESYREL) 100 MG tablet Take 1 tablet (100 mg total) by mouth at bedtime as needed for sleep. Take an additional 50 mg (1/2 tab) as needed 135 tablet 0   XOLAIR 150 MG/ML prefilled syringe Inject 300 mg into the skin every 28 (twenty-eight) days.     Current Facility-Administered Medications  Medication Dose Route Frequency Provider Last Rate Last Admin   omalizumab Geoffry Paradise(XOLAIR) prefilled syringe 300 mg  300 mg Subcutaneous Q28 days Nehemiah Settleale, Christine,  FNP   300 mg at 01/28/23 1338    REVIEW OF SYSTEMS:   Constitutional: ( - ) fevers, ( - )  chills , ( - ) night sweats Eyes: ( - ) blurriness of vision, ( - ) double vision, ( - ) watery eyes Ears, nose, mouth, throat, and face: ( - ) mucositis, ( - ) sore throat Respiratory: ( - ) cough, ( - ) dyspnea, ( - ) wheezes Cardiovascular: ( - ) palpitation, ( - ) chest discomfort, ( - ) lower extremity swelling Gastrointestinal:  ( - ) nausea, ( - ) heartburn, ( - ) change in bowel habits Skin: ( - ) abnormal skin rashes Lymphatics: ( - ) new lymphadenopathy, ( - ) easy bruising Neurological: ( - ) numbness, ( - ) tingling, ( - ) new weaknesses Behavioral/Psych: ( - ) mood change, ( - ) new changes  All other systems were reviewed with the patient and are negative.  PHYSICAL EXAMINATION: ECOG PERFORMANCE STATUS: 0 - Asymptomatic  Vitals:   02/06/23 0922  BP: (!) 155/94  Pulse: 86  Resp: 18  Temp: (!) 97.5 F (36.4 C)  SpO2: 100%   Filed Weights   02/06/23 0922  Weight: 197 lb 1.6 oz (89.4 kg)    GENERAL: well appearing female in NAD  SKIN: skin color, texture, turgor are normal, no rashes or significant  lesions EYES: conjunctiva are pink and non-injected, sclera clear LYMPH:  no palpable lymphadenopathy in the cervical or supraclavicular lymph nodes.  LUNGS: clear to auscultation and percussion with normal breathing effort HEART: regular rate & rhythm and no murmurs and no lower extremity edema Musculoskeletal: no cyanosis of digits and no clubbing  PSYCH: alert & oriented x 3, fluent speech NEURO: no focal motor/sensory deficits  LABORATORY DATA:  I have reviewed the data as listed    Latest Ref Rng & Units 02/06/2023   10:17 AM 02/03/2023    7:45 PM 11/21/2022    7:47 PM  CBC  WBC 4.0 - 10.5 K/uL 6.7  7.0  6.3   Hemoglobin 12.0 - 15.0 g/dL 16.1  09.6  04.5   Hematocrit 36.0 - 46.0 % 43.1  43.7  42.2   Platelets 150 - 400 K/uL 338  363  376        Latest Ref Rng & Units 02/06/2023   10:17 AM 02/03/2023    9:35 PM 11/21/2022    7:47 PM  CMP  Glucose 70 - 99 mg/dL 92  95  81   BUN 6 - 20 mg/dL 12  12  15    Creatinine 0.44 - 1.00 mg/dL 4.09  8.11  9.14   Sodium 135 - 145 mmol/L 138  138  137   Potassium 3.5 - 5.1 mmol/L 4.5  3.7  4.3   Chloride 98 - 111 mmol/L 105  106  105   CO2 22 - 32 mmol/L 29  24  22    Calcium 8.9 - 10.3 mg/dL 9.1  8.4  9.2   Total Protein 6.5 - 8.1 g/dL 7.6  7.4    Total Bilirubin 0.3 - 1.2 mg/dL 0.3  0.4    Alkaline Phos 38 - 126 U/L 102  94    AST 15 - 41 U/L 9  12    ALT 0 - 44 U/L 13  15      RADIOGRAPHIC STUDIES: I have personally reviewed the radiological images as listed and agreed with the findings in the report. CT Renal Stone  Study  Result Date: 02/03/2023 CLINICAL DATA:  Left flank pain.  Stones suspected EXAM: CT ABDOMEN AND PELVIS WITHOUT CONTRAST TECHNIQUE: Multidetector CT imaging of the abdomen and pelvis was performed following the standard protocol without IV contrast. RADIATION DOSE REDUCTION: This exam was performed according to the departmental dose-optimization program which includes automated exposure control, adjustment of the mA  and/or kV according to patient size and/or use of iterative reconstruction technique. COMPARISON:  CT urogram 01/10/2023 FINDINGS: Lower chest: Bibasilar scarring/atelectasis.  No acute abnormality. Hepatobiliary: No focal liver abnormality is seen. Status post cholecystectomy. No biliary dilatation. Pancreas: Unremarkable. Spleen: Unremarkable. Adrenals/Urinary Tract: Normal adrenal glands. Chronic right renal atrophy. No urinary calculi or hydronephrosis. Unremarkable bladder for degree of distention. Stomach/Bowel: Normal caliber large and small bowel. No bowel wall thickening. Normal appendix. Stomach is within normal limits. Vascular/Lymphatic: No significant vascular findings are present. No enlarged abdominal or pelvic lymph nodes. Reproductive: Hysterectomy. 4.3 cm left adnexal cyst. No follow-up imaging is recommended. Other: 8 mm round hypodensity posterior to the right adrenal gland on series 2/image 27 previously characterized as a benign cyst on MRI 05/28/2022. No free intraperitoneal fluid or air. Musculoskeletal: No acute osseous abnormality. IMPRESSION: 1. No acute abnormality in the abdomen or pelvis. No urinary calculi or hydronephrosis. 2. 4.3 cm left adnexal cyst. No follow-up imaging recommended. Note: This recommendation does not apply to premenarchal patients and to those with increased risk (genetic, family history, elevated tumor markers or other high-risk factors) of ovarian cancer. Reference: JACR 2020 Feb; 17(2):248-254 Electronically Signed   By: Minerva Fester M.D.   On: 02/03/2023 20:46    ASSESSMENT & PLAN Carli Deshazor Yetta Barre is a 47 y.o. female who presents to the clinic for evaluation of erythrocytosis and microcytosis.  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 --Recommend labs today to check CBC, CMP, Hgb electrophoresis, alpha thalassemia DNA, retic panel and iron panel. --RTC based on above workup.   Orders  Placed This Encounter  Procedures   CBC with Differential (Cancer Center Only)    Standing Status:   Future    Number of Occurrences:   1    Standing Expiration Date:   02/06/2024   CMP (Cancer Center only)    Standing Status:   Future    Number of Occurrences:   1    Standing Expiration Date:   02/06/2024   Ferritin    Standing Status:   Future    Number of Occurrences:   1    Standing Expiration Date:   02/06/2024   Iron and Iron Binding Capacity ([facility] only)    Standing Status:   Future    Number of Occurrences:   1    Standing Expiration Date:   02/06/2024   Hgb Fractionation Cascade    Standing Status:   Future    Number of Occurrences:   1    Standing Expiration Date:   02/06/2024   Alpha-Thalassemia GenotypR    All questions were answered. The patient knows to call the clinic with any problems, questions or concerns.  I have spent a total of 60 minutes minutes of face-to-face and non-face-to-face time, preparing to see the patient, obtaining and/or reviewing separately obtained history, performing a medically appropriate examination, counseling and educating the patient, ordering tests/procedures,  documenting clinical information in the electronic health record, and care coordination.   Georga Kaufmann, PA-C Department of Hematology/Oncology [facility] at [facility] Phone: [phone]  Patient was seen  with Dr. Leonides Schanz  I have read the above note and personally examined the patient. I agree with the assessment and plan as noted above.  Briefly Araiya Tramonte is a 47 year old female who presents for evaluation of microcytosis and erythrocytosis.  This is an interesting scenario and may represent a couple of different scenarios.  With the patient's microcytosis I would recommend pursuing iron studies with iron panel and ferritin.  Additionally because her hemoglobin is normal with an erythrocytosis this may represent a thalassemia.  Would recommend  ordering hemoglobin fractionation cascade as well as alpha thalassemia gene testing.  In the event the patient is found to be iron deficient would recommend supplementation with p.o. iron therapy.  The patient voiced understanding of our findings and the plan moving forward.  Ulysees Barns, MD Department of Hematology/Oncology [facility] at [facility] Phone: [phone] Pager: [phone] Email: [email or web address]

## 2023-02-08 LAB — HGB FRACTIONATION CASCADE
Hgb A2: 2.4 % (ref 1.8–3.2)
Hgb A: 97.6 % (ref 96.4–98.8)
Hgb F: 0 % (ref 0.0–2.0)
Hgb S: 0 %

## 2023-02-11 ENCOUNTER — Telehealth (HOSPITAL_BASED_OUTPATIENT_CLINIC_OR_DEPARTMENT_OTHER): Payer: BC Managed Care – PPO | Admitting: Psychiatry

## 2023-02-11 ENCOUNTER — Encounter (HOSPITAL_COMMUNITY): Payer: Self-pay | Admitting: Psychiatry

## 2023-02-11 DIAGNOSIS — F331 Major depressive disorder, recurrent, moderate: Secondary | ICD-10-CM | POA: Diagnosis not present

## 2023-02-11 DIAGNOSIS — F4310 Post-traumatic stress disorder, unspecified: Secondary | ICD-10-CM | POA: Diagnosis not present

## 2023-02-11 DIAGNOSIS — M545 Low back pain, unspecified: Secondary | ICD-10-CM | POA: Diagnosis not present

## 2023-02-11 DIAGNOSIS — M5432 Sciatica, left side: Secondary | ICD-10-CM | POA: Insufficient documentation

## 2023-02-11 DIAGNOSIS — F321 Major depressive disorder, single episode, moderate: Secondary | ICD-10-CM

## 2023-02-11 DIAGNOSIS — M5416 Radiculopathy, lumbar region: Secondary | ICD-10-CM | POA: Diagnosis not present

## 2023-02-11 MED ORDER — LAMOTRIGINE 100 MG PO TABS
100.0000 mg | ORAL_TABLET | Freq: Every day | ORAL | 1 refills | Status: DC
Start: 2023-02-11 — End: 2023-03-28

## 2023-02-11 MED ORDER — TRAZODONE HCL 100 MG PO TABS: 100.0000 mg | ORAL_TABLET | Freq: Every evening | ORAL | 0 refills | Status: DC | PRN
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
## 2023-02-13 ENCOUNTER — Other Ambulatory Visit: Payer: Self-pay | Admitting: Family

## 2023-02-13 DIAGNOSIS — L309 Dermatitis, unspecified: Secondary | ICD-10-CM

## 2023-02-13 DIAGNOSIS — Z91018 Allergy to other foods: Secondary | ICD-10-CM

## 2023-02-14 MED ORDER — EPINEPHRINE 0.3 MG/0.3ML IJ SOAJ
0.3000 mg | INTRAMUSCULAR | 0 refills | Status: DC | PRN
Start: 2023-02-14 — End: 2023-03-19

## 2023-02-18 NOTE — Progress Notes (Signed)
Patient ID: Brianna Gutierrez, female    DOB: 03-01-1976  MRN: [id number]  CC: Chronic Care Management   Subjective: Brianna Gutierrez is a 47 y.o. female who presents for chronic care management.   Her concerns today include:  - Doing well on blood pressure medications, no issues/concerns. She denies red flag symptoms such as but not limited to chest pain, shortness of breath, worst headache of life, nausea/vomiting. She does not check blood pressure outside of office.  - Doing well on prediabetes medication, no issues/concerns. - Lower midline back pain persisting. She denies recent trauma/injury. Reports she was seen at the Emergency Department and by Orthopedics. Reports she was told "nothing is wrong" with her back. Reports she was prescribed a muscle relaxer which has not been helpful. She would like a second opinion referral to Orthopedics.  - She is established with [facility] for management of allergies. Reports she plans to schedule a follow-up appointment with them soon.   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 30 tablet 0   albuterol (VENTOLIN HFA) 108 (90 Base) MCG/ACT inhaler Inhale 2 puffs into the lungs every 6 (six) hours as needed for wheezing or shortness of breath. 8 g 2   EPINEPHrine (EPIPEN 2-PAK) 0.3 mg/0.3 mL IJ SOAJ injection Inject 0.3 mg into the muscle as needed for anaphylaxis. 2 each 0   famotidine (PEPCID) 20 MG tablet Take 1 tablet (20 mg total) by mouth 2 (two) times daily. 60 tablet 3   fexofenadine (ALLEGRA) 180 MG tablet Take 2 tablets twice a day for hives. 120 tablet 2   gabapentin (NEURONTIN) 300 MG capsule Take 1 capsule (300 mg total) by mouth at bedtime. 30 capsule 2   lamoTRIgine (LAMICTAL) 100 MG tablet Take 1 tablet (100 mg total) by mouth daily. 30 tablet 1   methocarbamol (ROBAXIN) 500 MG tablet Take 1 tablet (500 mg total) by mouth 2 (two) times daily. 20 tablet 0   traZODone (DESYREL) 100 MG tablet Take 1 tablet (100 mg total) by mouth at bedtime as needed for sleep. Take an additional 50 mg (1/2 tab) as needed 135 tablet 0   XOLAIR 150 MG/ML prefilled syringe Inject 300 mg into the skin every 28 (twenty-eight) days.     ondansetron (ZOFRAN-ODT) 4 MG disintegrating tablet Take 1 tablet (4 mg total) by mouth every 8 (eight) hours as needed for nausea or vomiting. 20 tablet 0   [DISCONTINUED] dicyclomine (BENTYL) 20 MG tablet Take 1 tablet (20 mg total) by mouth 2 (two) times daily. 20 tablet 0   [DISCONTINUED] omeprazole (PRILOSEC) 20 MG capsule Take  1 capsule (20 mg total) by mouth daily. 30 capsule 1   Current Facility-Administered Medications on File Prior to Visit  Medication Dose Route Frequency Provider Last Rate Last Admin   omalizumab Geoffry Paradise) prefilled syringe 300 mg  300 mg Subcutaneous Q28 days Nehemiah Settle, FNP   300 mg at 02/25/23 1536    Allergies  Allergen Reactions   Pork-Derived Products    Tramadol     rash   Tramadol Other (See Comments)    Headache    Social History   Socioeconomic  History   Marital status: Married    Spouse name: Not on file   Number of children: Not on file   Years of education: Not on file   Highest education level: 12th grade  Occupational History   Not on file  Tobacco Use   Smoking status: Every Day    Packs/day: 0.50    Years: 20.00    Additional pack years: 0.00    Total pack years: 10.00    Types: Cigarettes    Passive exposure: Never   Smokeless tobacco: Never  Vaping Use   Vaping Use: Former  Substance and Sexual Activity   Alcohol use: Not Currently   Drug use: Never   Sexual activity: Not Currently    Partners: Male    Birth control/protection: Surgical    Comment: TLH 09/11/21  Other Topics Concern   Not on file  Social History Narrative   ** Merged History Encounter **       Social Determinants of Health   Financial Resource Strain: Medium Risk (02/22/2023)   Overall Financial Resource Strain (CARDIA)    Difficulty of Paying Living Expenses: Somewhat hard  Food Insecurity: Food Insecurity Present (02/22/2023)   Hunger Vital Sign    Worried About Running Out of Food in the Last Year: Sometimes true    Ran Out of Food in the Last Year: Sometimes true  Transportation Needs: No Transportation Needs (02/22/2023)   PRAPARE - Administrator, Civil Service (Medical): No    Lack of Transportation (Non-Medical): No  Physical Activity: Inactive (02/22/2023)   Exercise Vital Sign    Days of Exercise per Week: 1 day    Minutes of Exercise per Session: 0 min  Stress: Stress Concern Present (02/22/2023)   [employer] of Occupational Health - Occupational Stress Questionnaire    Feeling of Stress : Very much  Social Connections: Socially Isolated (02/22/2023)   Social Connection and Isolation Panel [NHANES]    Frequency of Communication with Friends and Family: More than three times a week    Frequency of Social Gatherings with Friends and Family: Once a week    Attends Religious Services: Never    Automotive engineer or Organizations: No    Attends Engineer, structural: Not on file    Marital Status: Separated  Intimate Partner Violence: Not on file    Family History  Problem Relation Age of Onset   Hypertension Maternal Grandmother    Diabetes Maternal Grandfather    Allergic rhinitis Daughter    Allergic rhinitis Daughter    Allergic rhinitis Son    Allergic rhinitis Son    Colon cancer Neg Hx    Esophageal cancer Neg Hx    Rectal cancer Neg Hx    Stomach cancer Neg Hx     Past Surgical History:  Procedure Laterality Date   BLADDER SUSPENSION N/A 09/11/2021   Procedure: TRANSVAGINAL TAPE (TVT) PROCEDURE;  Surgeon:  Patton Salles, MD;  Location: [facility];  Service: Gynecology;  Laterality: N/A;   CESAREAN SECTION  1997   x 1   CHOLECYSTECTOMY N/A 01/14/2019   Procedure: LAPAROSCOPIC CHOLECYSTECTOMY WITH POSSIBLE  INTRAOPERATIVE CHOLANGIOGRAM;  Surgeon: Darnell Level, MD;  Location: [facility];  Service: General;  Laterality: N/A;   CYSTOSCOPY N/A 09/11/2021   Procedure: Bluford Kaufmann;  Surgeon: Patton Salles, MD;  Location: [facility];  Service: Gynecology;  Laterality: N/A;   CYSTOSCOPY/URETEROSCOPY/HOLMIUM LASER Left 01/31/2022   Procedure: LEFT URETEROSCOPY/HOLMIUM LASER;  Surgeon: Despina Arias, MD;  Location: [facility];  Service: Urology;  Laterality: Left;   DILITATION & CURRETTAGE/HYSTROSCOPY WITH HYDROTHERMAL ABLATION N/A 06/22/2016   Procedure: DILATATION & CURETTAGE/HYSTEROSCOPY WITH HYDROTHERMAL ABLATION;  Surgeon: Brock Bad, MD;  Location: [facility];  Service: Gynecology;  Laterality: N/A;   TOTAL LAPAROSCOPIC HYSTERECTOMY WITH SALPINGECTOMY Bilateral 09/11/2021   Procedure: TOTAL LAPAROSCOPIC HYSTERECTOMY WITH SALPINGECTOMY;  Surgeon: Patton Salles, MD;  Location: [facility];  Service: Gynecology;  Laterality: Bilateral;   TUBAL LIGATION     09-28-2015    ROS: Review of  Systems Negative except as stated above  PHYSICAL EXAM: BP (!) 144/105 (Cuff Size: Large)   Pulse 91   Wt 202 lb 9.6 oz (91.9 kg)   LMP  (LMP Unknown)   SpO2 94%   BMI 37.06 kg/m   Physical Exam HENT:     Head: Normocephalic and atraumatic.  Eyes:     Extraocular Movements: Extraocular movements intact.     Conjunctiva/sclera: Conjunctivae normal.     Pupils: Pupils are equal, round, and reactive to light.  Cardiovascular:     Rate and Rhythm: Normal rate and regular rhythm.     Pulses: Normal pulses.     Heart sounds: Normal heart sounds.  Pulmonary:     Effort: Pulmonary effort is normal.     Breath sounds: Normal breath sounds.  Musculoskeletal:     Cervical back: Normal, normal range of motion and neck supple.     Thoracic back: Normal.     Lumbar back: Tenderness present.  Neurological:     General: No focal deficit present.     Mental Status: She is alert and oriented to person, place, and time.  Psychiatric:        Mood and Affect: Mood normal.        Behavior: Behavior normal.     ASSESSMENT AND PLAN: 1. Primary hypertension - Blood pressure not at goal during today's visit. Patient asymptomatic without chest pressure, chest pain, palpitations, shortness of breath, worst headache of life, and any additional red flag symptoms. - Continue Amlodipine as prescribed.  - Increase Hydrochlorothiazide from 12.5 mg daily to 25 mg daily.  - Counseled on blood pressure goal of less than 130/80, low-sodium, DASH diet, medication compliance, and 150 minutes of moderate intensity exercise per week as tolerated. Counseled on medication adherence and adverse effects. - Follow-up with primary provider in 4 weeks or sooner if needed for blood pressure check. - amLODipine (NORVASC) 10 MG tablet; Take 1 tablet (10 mg total) by mouth every evening.  Dispense: 90 tablet; Refill: 0 - hydrochlorothiazide (HYDRODIURIL) 25 MG tablet; Take 1 tablet (25 mg total) by mouth daily.  Dispense:  30 tablet; Refill: 2  2. Prediabetes - Continue Metformin as prescribed.  - Routine screening.  - Follow-up with primary provider as scheduled.  - Hemoglobin A1c - metFORMIN (GLUCOPHAGE)  500 MG tablet; Take 1 tablet (500 mg total) by mouth 2 (two) times daily with a meal.  Dispense: 180 tablet; Refill: 0  3. Screening cholesterol level - Routine screening.  - Lipid panel  4. Lumbar back pain 5. Sciatica, left side - Referral to Orthopedics for further evaluation/management.  - Ambulatory referral to Orthopedics   Patient was given the opportunity to ask questions.  Patient verbalized understanding of the plan and was able to repeat key elements of the plan. Patient was given clear instructions to go to Emergency Department or return to medical center if symptoms don't improve, worsen, or new problems develop.The patient verbalized understanding.   Orders Placed This Encounter  Procedures   Lipid panel   Hemoglobin A1c   Ambulatory referral to Orthopedics    Requested Prescriptions   Signed Prescriptions Disp Refills   amLODipine (NORVASC) 10 MG tablet 90 tablet 0    Sig: Take 1 tablet (10 mg total) by mouth every evening.   hydrochlorothiazide (HYDRODIURIL) 25 MG tablet 30 tablet 2    Sig: Take 1 tablet (25 mg total) by mouth daily.   metFORMIN (GLUCOPHAGE) 500 MG tablet 180 tablet 0    Sig: Take 1 tablet (500 mg total) by mouth 2 (two) times daily with a meal.    Return in about 4 weeks (around 03/26/2023) for Follow-Up or next available bp check.  [name], NP

## 2023-02-21 ENCOUNTER — Ambulatory Visit (HOSPITAL_COMMUNITY): Payer: BC Managed Care – PPO | Admitting: Clinical

## 2023-02-25 ENCOUNTER — Ambulatory Visit (INDEPENDENT_AMBULATORY_CARE_PROVIDER_SITE_OTHER): Payer: BC Managed Care – PPO

## 2023-02-25 DIAGNOSIS — L501 Idiopathic urticaria: Secondary | ICD-10-CM

## 2023-02-26 ENCOUNTER — Encounter: Payer: Self-pay | Admitting: Family

## 2023-02-26 ENCOUNTER — Ambulatory Visit (INDEPENDENT_AMBULATORY_CARE_PROVIDER_SITE_OTHER): Payer: BC Managed Care – PPO | Admitting: Family

## 2023-02-26 VITALS — BP 144/105 | HR 91 | Wt 202.6 lb

## 2023-02-26 DIAGNOSIS — R7303 Prediabetes: Secondary | ICD-10-CM | POA: Diagnosis not present

## 2023-02-26 DIAGNOSIS — M545 Low back pain, unspecified: Secondary | ICD-10-CM

## 2023-02-26 DIAGNOSIS — M5432 Sciatica, left side: Secondary | ICD-10-CM

## 2023-02-26 DIAGNOSIS — M5416 Radiculopathy, lumbar region: Secondary | ICD-10-CM | POA: Insufficient documentation

## 2023-02-26 DIAGNOSIS — Z13220 Encounter for screening for lipoid disorders: Secondary | ICD-10-CM

## 2023-02-26 DIAGNOSIS — I10 Essential (primary) hypertension: Secondary | ICD-10-CM

## 2023-02-26 MED ORDER — HYDROCHLOROTHIAZIDE 25 MG PO TABS
25.0000 mg | ORAL_TABLET | Freq: Every day | ORAL | 2 refills | Status: DC
Start: 2023-02-26 — End: 2023-03-26

## 2023-02-26 MED ORDER — METFORMIN HCL 500 MG PO TABS
500.0000 mg | ORAL_TABLET | Freq: Two times a day (BID) | ORAL | 0 refills | Status: DC
Start: 2023-02-26 — End: 2023-07-08

## 2023-02-26 MED ORDER — AMLODIPINE BESYLATE 10 MG PO TABS
10.0000 mg | ORAL_TABLET | Freq: Every evening | ORAL | 0 refills | Status: DC
Start: 2023-02-26 — End: 2023-03-26

## 2023-02-26 NOTE — Progress Notes (Signed)
Patient here to follow up on HTN, also stated to having back problems and is wanting medication to help with allergies

## 2023-02-28 ENCOUNTER — Emergency Department (HOSPITAL_COMMUNITY): Payer: BC Managed Care – PPO

## 2023-02-28 ENCOUNTER — Emergency Department (HOSPITAL_COMMUNITY)
Admission: EM | Admit: 2023-02-28 | Discharge: 2023-02-28 | Disposition: A | Discharge status: Home / Self Care | Payer: BC Managed Care – PPO | Attending: Emergency Medicine | Admitting: Emergency Medicine

## 2023-02-28 ENCOUNTER — Other Ambulatory Visit: Payer: Self-pay

## 2023-02-28 DIAGNOSIS — I10 Essential (primary) hypertension: Secondary | ICD-10-CM | POA: Diagnosis not present

## 2023-02-28 DIAGNOSIS — R519 Headache, unspecified: Secondary | ICD-10-CM | POA: Diagnosis not present

## 2023-02-28 DIAGNOSIS — I1A0 Resistant hypertension: Secondary | ICD-10-CM

## 2023-02-28 DIAGNOSIS — Z79899 Other long term (current) drug therapy: Secondary | ICD-10-CM | POA: Insufficient documentation

## 2023-02-28 DIAGNOSIS — Z7984 Long term (current) use of oral hypoglycemic drugs: Secondary | ICD-10-CM | POA: Diagnosis not present

## 2023-02-28 MED ORDER — IBUPROFEN 600 MG PO TABS: 600.0000 mg | ORAL_TABLET | Freq: Four times a day (QID) | ORAL | 0 refills | Status: DC | PRN

## 2023-02-28 MED ORDER — ACETAMINOPHEN 500 MG PO TABS
1000.0000 mg | ORAL_TABLET | Freq: Once | ORAL | Status: AC
  Administered 2023-02-28: 1000 mg via ORAL
  Filled 2023-02-28: qty 2

## 2023-02-28 NOTE — ED Provider Notes (Signed)
Brianna Gutierrez EMERGENCY DEPARTMENT AT [facility] Provider Note   CSN: [id number] Arrival date & time: 02/28/23  1543     History  Chief Complaint  Patient presents with   Headache    Brianna Gutierrez is a 47 y.o. female.  The history is provided by the patient and medical records. No language interpreter was used.  Headache    47 year old female significant history of hypertension, anxiety, PTSD presenting with complaints of headache.  Patient reports this morning she woke up with headache.  She described headache as a throbbing pressure sensation to her forehead and to both temples that has been persistent.  Headache is accompanied with some light sensitivity as well as some sensation of lightheadedness and dizziness.  Patient took some Excedrin but noticed no improvement.  She went to work and headache persist.  She went for lunch and went to [facility] to have a blood pressure check and reportedly systolic blood pressure was 169.  She discussed this with her coworker who encouraged patient to come to the ER for further assessment.  Patient states she is currently on 2 blood pressure medication and has been compliant with it.  She mention she does not normally check her blood pressure but states her doctor have "maxed out the dosage" of her blood pressure medication.  She denies any dietary changes denies increasing stress no complaints of double vision, sinus congestion, nausea or vomiting focal numbness focal weakness rash or fever.  She does not normally have headaches.  Home Medications Prior to Admission medications   Medication Sig Start Date End Date Taking? Authorizing Provider  acetaminophen (TYLENOL) 500 MG tablet Take 2 tablets (1,000 mg total) by mouth every 6 (six) hours as needed. 11/15/22   Gailen Shelter, PA  albuterol (VENTOLIN HFA) 108 (90 Base) MCG/ACT inhaler Inhale 2 puffs into the lungs every 6 (six) hours as needed for wheezing or shortness of breath.  02/14/22   Cobb, Ruby Cola, NP  amLODipine (NORVASC) 10 MG tablet Take 1 tablet (10 mg total) by mouth every evening. 02/26/23 05/27/23  [name], NP  EPINEPHrine (EPIPEN 2-PAK) 0.3 mg/0.3 mL IJ SOAJ injection Inject 0.3 mg into the muscle as needed for anaphylaxis. 02/14/23   [name], NP  famotidine (PEPCID) 20 MG tablet Take 1 tablet (20 mg total) by mouth 2 (two) times daily. 04/18/22   Ellamae Sia, DO  fexofenadine (ALLEGRA) 180 MG tablet Take 2 tablets twice a day for hives. 08/21/22   Ellamae Sia, DO  gabapentin (NEURONTIN) 300 MG capsule Take 1 capsule (300 mg total) by mouth at bedtime. 07/30/22   [name], NP  hydrochlorothiazide (HYDRODIURIL) 25 MG tablet Take 1 tablet (25 mg total) by mouth daily. 02/26/23 05/27/23  [name], NP  lamoTRIgine (LAMICTAL) 100 MG tablet Take 1 tablet (100 mg total) by mouth daily. 02/11/23 04/12/23  Stasia Cavalier, MD  metFORMIN (GLUCOPHAGE) 500 MG tablet Take 1 tablet (500 mg total) by mouth 2 (two) times daily with a meal. 02/26/23 05/27/23  [name], NP  methocarbamol (ROBAXIN) 500 MG tablet Take 1 tablet (500 mg total) by mouth 2 (two) times daily. 02/03/23   Prosperi, Christian H, PA-C  ondansetron (ZOFRAN-ODT) 4 MG disintegrating tablet Take 1 tablet (4 mg total) by mouth every 8 (eight) hours as needed for nausea or vomiting. 11/15/22   Gailen Shelter, PA  traZODone (DESYREL) 100 MG tablet Take 1 tablet (100 mg total)  by mouth at bedtime as needed for sleep. Take an additional 50 mg (1/2 tab) as needed 02/11/23   Stasia Cavalier, MD  Geoffry Paradise 150 MG/ML prefilled syringe Inject 300 mg into the skin every 28 (twenty-eight) days. 08/27/22   [provider]  dicyclomine (BENTYL) 20 MG tablet Take 1 tablet (20 mg total) by mouth 2 (two) times daily. 10/29/19 08/24/20  Belinda Fisher, PA-C  omeprazole (PRILOSEC) 20 MG capsule Take 1 capsule (20 mg total) by mouth daily. 01/01/19 08/07/19  Bing Neighbors, NP      Allergies     Pork-derived products, Tramadol, and Tramadol    Review of Systems   Review of Systems  Neurological:  Positive for headaches.  All other systems reviewed and are negative.   Physical Exam Updated Vital Signs BP (!) 180/106 (BP Location: Left Arm)   Pulse 95   Temp 98.3 F (36.8 C) (Oral)   Resp 16   Ht 4\' 9"  (1.448 m)   Wt 90.7 kg   LMP  (LMP Unknown)   SpO2 96%   BMI 43.28 kg/m  Physical Exam Vitals and nursing note reviewed.  Constitutional:      General: She is not in acute distress.    Appearance: She is well-developed. She is obese.     Comments: Obese female in bed in a dimly lit room appears to be in no acute discomfort.  She is nontoxic in appearance  HENT:     Head: Atraumatic.     Mouth/Throat:     Mouth: Mucous membranes are moist.  Eyes:     General: No visual field deficit.    Extraocular Movements: Extraocular movements intact.     Conjunctiva/sclera: Conjunctivae normal.     Pupils: Pupils are equal, round, and reactive to light.  Neck:     Comments: No nuchal rigidity Cardiovascular:     Rate and Rhythm: Normal rate and regular rhythm.  Pulmonary:     Effort: Pulmonary effort is normal.  Abdominal:     Palpations: Abdomen is soft.  Musculoskeletal:        General: Normal range of motion.     Cervical back: Normal range of motion and neck supple.  Skin:    General: Skin is warm.     Findings: No rash.  Neurological:     Mental Status: She is alert and oriented to person, place, and time.     GCS: GCS eye subscore is 4. GCS verbal subscore is 5. GCS motor subscore is 6.     Cranial Nerves: No dysarthria or facial asymmetry.     Sensory: No sensory deficit.     Motor: No weakness.     Coordination: Romberg sign negative. Coordination normal.     Gait: Gait normal.  Psychiatric:        Mood and Affect: Mood normal.     ED Results / Procedures / Treatments   Labs (all labs ordered are listed, but only abnormal results are displayed) Labs  Reviewed - No data to display  EKG None ED ECG REPORT   Date: 02/28/2023  Rate: 87  Rhythm: normal sinus rhythm  QRS Axis: normal  Intervals: normal  ST/T Wave abnormalities: nonspecific T wave changes  Conduction Disutrbances:none  Narrative Interpretation:   Old EKG Reviewed: unchanged  I have personally reviewed the EKG tracing and agree with the computerized printout as noted.   Radiology CT Head Wo Contrast  Result Date: 02/28/2023 CLINICAL DATA:  Head  trauma, GCS=15, severe headache (Ped 2-17y) EXAM: CT HEAD WITHOUT CONTRAST TECHNIQUE: Contiguous axial images were obtained from the base of the skull through the vertex without intravenous contrast. RADIATION DOSE REDUCTION: This exam was performed according to the departmental dose-optimization program which includes automated exposure control, adjustment of the mA and/or kV according to patient size and/or use of iterative reconstruction technique. COMPARISON:  None Available. FINDINGS: Brain: No evidence of acute infarction, hemorrhage, hydrocephalus, extra-axial collection or mass lesion/mass effect. Vascular: No hyperdense vessel identified. Skull: No acute fracture. Sinuses/Orbits: Clear sinuses.  No acute orbital findings. Other: No mastoid effusions. IMPRESSION: No evidence of acute intracranial abnormality. Electronically Signed   By: Feliberto Harts M.D.   On: 02/28/2023 17:59    Procedures Procedures    Medications Ordered in ED Medications  acetaminophen (TYLENOL) tablet 1,000 mg (1,000 mg Oral Given 02/28/23 1636)    ED Course/ Medical Decision Making/ A&P                             Medical Decision Making Amount and/or Complexity of Data Reviewed Radiology: ordered. ECG/medicine tests: ordered.   BP (!) 180/106 (BP Location: Left Arm)   Pulse 95   Temp 98.3 F (36.8 C) (Oral)   Resp 16   Ht 4\' 9"  (1.448 m)   Wt 90.7 kg   LMP  (LMP Unknown)   SpO2 96%   BMI 43.28 kg/m   34:35 PM  47 year old  female significant history of hypertension, anxiety, PTSD presenting with complaints of headache.  Patient reports this morning she woke up with headache.  She described headache as a throbbing pressure sensation to her forehead and to both temples that has been persistent.  Headache is accompanied with some light sensitivity as well as some sensation of lightheadedness and dizziness.  She went to work and headache persist.  She went for lunch and went to [facility] to have a blood pressure check and reportedly systolic blood pressure was 169.  She discussed this with her coworker who encouraged patient to come to the ER for further assessment.  Patient states she is currently on 2 blood pressure medication and has been compliant with it.  She mention she does not normally check her blood pressure but states her doctor have "maxed out the dosage" of her blood pressure medication.  She denies any dietary changes denies increasing stress no complaints of double vision, sinus congestion, nausea or vomiting focal numbness focal weakness rash or fever.  She does not normally have headaches.  On exam, this is a overall well-appearing female appears to be in no acute discomfort.  She is in room with her legs and soft.  She does have some discomfort with light exposure extending some mild photophobia however she is without any focal neurodeficit on exam.  Heart with normal rate and rhythm, lungs clear to auscultation bilaterally abdomen is soft nontender 5 of 5 strength to all 4 extremities and patient is alert and oriented x 4.  Vital signs notable for elevated blood pressure of 181/106.  She is afebrile no hypoxia.  Suspect headaches likely in the setting of elevated blood pressure.  Since patient does not normally have headache, I think is reasonable to obtain a CT scan of her head as well as an EKG.  Plan to give migraine cocktail for symptom control.  At this time patient without any infectious symptoms concerning  for meningitis, no acute onset thunderclap headache  concerning for subarachnoid hemorrhage, no focal neurodeficit concerning for stroke or space-occupying lesion.  6:04 PM EKG obtained independently viewed by me and overall reassuring.  CT scan of the head obtained independently viewed interpreted by me and I agree with radiologist interpretation.  CT without any concerning finding.  Blood pressure improved without any specific treatment.  Current blood pressure is 160/96.  Patient without any concerning finding.  Tylenol given for headache with some improvement of symptoms.  Recommend patient to follow-up closely with PCP for BP management.  Pt d/c home with ibuprofen as needed.  Return precaution given. SOcial determinant of health includes tobacco use, food insecurity, depression, social isolation        Final Clinical Impression(s) / ED Diagnoses Final diagnoses:  Bad headache  Resistant hypertension    Rx / DC Orders ED Discharge Orders          Ordered    ibuprofen (ADVIL) 600 MG tablet  Every 6 hours PRN        02/28/23 1810              Fayrene Helper, PA-C 02/28/23 1815    Mardene Sayer, MD 02/28/23 2244

## 2023-02-28 NOTE — ED Triage Notes (Addendum)
C/o headache that started this am with photosensitivity and dizziness.  Denies n/v. Reports hx HTN with no missed doses.  HCTZ increased 2 days ago.

## 2023-02-28 NOTE — Discharge Instructions (Addendum)
Your headache is likely due to elevated blood pressure.  Please continue to take your blood pressure medications.  You may take over-the-counter medications such as Excedrin, Tylenol, or ibuprofen as needed for headache.  Call and follow-up closely with your primary care doctor for recheck of your blood pressure medication and possible blood pressure medication changes for better blood pressure control.  Return if you have any concern.  Today your head CT scan did not show any concerning finding.

## 2023-03-01 LAB — ALPHA-THALASSEMIA GENOTYPR

## 2023-03-04 ENCOUNTER — Other Ambulatory Visit: Payer: Self-pay | Admitting: Physician Assistant

## 2023-03-04 MED ORDER — FERROUS SULFATE 325 (65 FE) MG PO TBEC: DELAYED_RELEASE_TABLET | ORAL | 6 refills | Status: DC

## 2023-03-04 NOTE — Progress Notes (Signed)
I called Ms. Brianna Gutierrez to review the lab results from 02/06/2023. Findings again showed erythrocytosis and microcytosis. Patient has mild iron deficiency without anemia. I will send PO ferrous sulfate 325 mg once every other day with a source of vitamin C. We will reach out to GI to evaluate GI source since patient is not complaining of bleeding including menstrual bleeding.   In addition, results confirmed carrier for alpha thalassemia which can cause the microcytosis and erythrocytosis. I reviewed that there is no long term effect for this condition and requires no further intervention.   I will see patient back in 6 months to recheck her iron levels. Ms. Brianna Gutierrez expressed understanding of the plan provided.

## 2023-03-07 ENCOUNTER — Encounter (HOSPITAL_COMMUNITY): Payer: Self-pay | Admitting: Clinical

## 2023-03-07 ENCOUNTER — Ambulatory Visit (INDEPENDENT_AMBULATORY_CARE_PROVIDER_SITE_OTHER): Payer: BC Managed Care – PPO | Admitting: Clinical

## 2023-03-07 DIAGNOSIS — F331 Major depressive disorder, recurrent, moderate: Secondary | ICD-10-CM | POA: Diagnosis not present

## 2023-03-07 DIAGNOSIS — G478 Other sleep disorders: Secondary | ICD-10-CM

## 2023-03-07 DIAGNOSIS — F4310 Post-traumatic stress disorder, unspecified: Secondary | ICD-10-CM
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
## 2023-03-08 ENCOUNTER — Telehealth: Payer: Self-pay

## 2023-03-08 NOTE — Telephone Encounter (Signed)
Patient is scheduled on 05/16/23 at 10 AM with Quentin Mulling, PA.

## 2023-03-08 NOTE — Telephone Encounter (Signed)
-----   Message from Shellia Cleverly, DO sent at 03/08/2023 12:57 PM EDT ----- Karena Addison, Thanks for reaching out.  She did have a colonoscopy done, but I suppose not unreasonable to discuss with her further evaluation with upper endoscopy and/or video capsule endoscopy for further small bowel interrogation with the slight iron deficiency.  Thanks!  Majel Homer, Can you get this patient an OV with me or one of the APP's to discuss potentially upper endoscopy +/- VCE for iron deficiency?  Thank you.   ----- Message ----- From: Raymondo Band Sent: 03/04/2023   3:07 PM EDT To: Shellia Cleverly, DO  Dr. Barron Alvine,   I saw Ms. Jones for evaluation for microcytosis. We checked her iron panel which showed iron deficiency without anemia. She did not report any signs of bleeding including menstrual bleeding. I wanted to confirm if you wanted to see her in clinic to rule out a GI source. I will be starting her on PO iron therapy.   Thanks, Karena Addison

## 2023-03-18 ENCOUNTER — Ambulatory Visit (INDEPENDENT_AMBULATORY_CARE_PROVIDER_SITE_OTHER): Payer: BC Managed Care – PPO | Admitting: Physical Medicine and Rehabilitation

## 2023-03-18 ENCOUNTER — Encounter: Payer: Self-pay | Admitting: Physical Medicine and Rehabilitation

## 2023-03-18 DIAGNOSIS — M7918 Myalgia, other site: Secondary | ICD-10-CM | POA: Diagnosis not present

## 2023-03-18 DIAGNOSIS — M47819 Spondylosis without myelopathy or radiculopathy, site unspecified: Secondary | ICD-10-CM

## 2023-03-18 DIAGNOSIS — G8929 Other chronic pain: Secondary | ICD-10-CM | POA: Diagnosis not present

## 2023-03-18 DIAGNOSIS — M545 Low back pain, unspecified: Secondary | ICD-10-CM

## 2023-03-18 MED ORDER — MELOXICAM 15 MG PO TABS: 15.0000 mg | ORAL_TABLET | Freq: Every day | ORAL | 0 refills | Status: DC

## 2023-03-18 MED ORDER — BACLOFEN 10 MG PO TABS
10.0000 mg | ORAL_TABLET | Freq: Three times a day (TID) | ORAL | 0 refills | Status: DC
Start: 2023-03-18 — End: 2023-08-06

## 2023-03-18 NOTE — Progress Notes (Unsigned)
Brianna Gutierrez - 47 y.o. female MRN [id number]  Date of birth: Feb 29, 1976  Office Visit Note: Visit Date: 03/18/2023 PCP: [name], NP Referred by: [name], NP  Subjective: Chief Complaint  Patient presents with   Lower Back - Pain   HPI: Brianna Gutierrez is a 47 y.o. female who comes in today per the request of Ricky Stabs, NP for evaluation of chronic, worsening and severe bilateral lower back pain.  Pain ongoing for several months, states she noticed this pain when she sat down to use restroom. Pain worsens with bending and laying flat. Reports cramping sensation to bilateral legs when bending down. She describes pain as sore and aching sensation, currently rates as 5 out 10. She has tried home exercise regimen, heating pad, rest and medications with minimal relief of pain. No history of lumbar surgery/injections. No prior lumbar MRI imaging. Patient was seen in emergency department for flank pain on 02/03/2023, negative renal CT, diagnosed with lumbar strain and discharged with Robaxin. More recently she was evaluated by Dr. Freddrick March [facility], per her notes she instructed patient to start home exercise regimen and prescribed Prednisone/Meloxicam, recommended formal physical therapy if her symptoms persist. Patient states she does not wish to return this [facility] and would like second opinion regarding her lower back pain. Patient currently works at [employer]. Patient denies focal weakness, numbness and tingling. No recent trauma or falls.   Patients course is complicated by depression and PTSD.    Review of Systems  Musculoskeletal:  Positive for back pain and myalgias.  Neurological:  Negative for tingling, sensory change, focal weakness and weakness.  All other systems reviewed and are negative.  Otherwise per HPI.  Assessment & Plan: Visit Diagnoses:    ICD-10-CM   1. Chronic bilateral low back pain without sciatica  M54.50 Ambulatory  referral to Physical Therapy   G89.29     2. Spondylosis without myelopathy or radiculopathy  M47.819 Ambulatory referral to Physical Therapy    3. Myofascial pain syndrome  M79.18 Ambulatory referral to Physical Therapy       Plan: Findings:  Chronic, worsening and severe bilateral lower back pain. No radicular symptoms. Patient continues to have severe pain despite good conservative therapies such as home exercise regimen, rest and use of medications. Patients clinical presentation and exam are consistent with myofascial pain, she is tender upon palpation of bilateral lumbar paraspinal regions on exam today. Could also be facet mediated pain as she does have pain with standing and lumbar extension. We discussed treatment plan in detail today, next step is to place order for regimen of formal physical therapy, I do feel she would benefit from manual treatments and dry needling. I explain to patient that cramping in both legs is likely not directly related to her spine, I am hopeful PT will help with this. If her pain persists we discussed possibility of obtaining lumbar MRI imaging. I instructed patient to stop Robaxin, I prescribed Baclofen and refilled Meloxicam. I would like to see her back in approximately 6 weeks for re-evaluation. Patient encouraged to continue with home exercise regimen as tolerated. No red flag symptoms noted upon exam today.     Meds & Orders:  Meds ordered this encounter  Medications   meloxicam (MOBIC) 15 MG tablet    Sig: Take 1 tablet (15 mg total) by mouth daily.    Dispense:  30 tablet    Refill:  0   baclofen (LIORESAL) 10  MG tablet    Sig: Take 1 tablet (10 mg total) by mouth 3 (three) times daily.    Dispense:  30 each    Refill:  0    Orders Placed This Encounter  Procedures   Ambulatory referral to Physical Therapy    Follow-up: Return for 6 week follow up for re-evaluation.   Procedures: No procedures performed      Clinical History: No  specialty comments available.   She reports that she has been smoking cigarettes. She has a 10.00 pack-year smoking history. She has never been exposed to tobacco smoke. She has never used smokeless tobacco.  Recent Labs    07/30/22 1112  HGBA1C 6.0*    Objective:  VS:  HT:    WT:   BMI:     BP:   HR: bpm  TEMP: ( )  RESP:  Physical Exam Vitals and nursing note reviewed.  HENT:     Head: Normocephalic and atraumatic.     Right Ear: External ear normal.     Left Ear: External ear normal.     Nose: Nose normal.     Mouth/Throat:     Mouth: Mucous membranes are moist.  Eyes:     Extraocular Movements: Extraocular movements intact.  Cardiovascular:     Rate and Rhythm: Normal rate.     Pulses: Normal pulses.  Pulmonary:     Effort: Pulmonary effort is normal.  Abdominal:     General: Abdomen is flat. There is distension.  Musculoskeletal:        General: Tenderness present.     Cervical back: Normal range of motion.     Comments: Patient rises from seated position to standing without difficulty. Good lumbar range of motion. No pain noted with facet loading. 5/5 strength noted with bilateral hip flexion, knee flexion/extension, ankle dorsiflexion/plantarflexion and EHL. No clonus noted bilaterally. No pain upon palpation of greater trochanters. No pain with internal/external rotation of bilateral hips. Sensation intact bilaterally. Tenderness noted upon palpation of bilateral lumbar paraspinal regions. Negative slump test bilaterally. Ambulates without aid, gait steady.     Skin:    General: Skin is warm and dry.     Capillary Refill: Capillary refill takes less than 2 seconds.  Neurological:     General: No focal deficit present.     Mental Status: She is alert and oriented to person, place, and time.  Psychiatric:        Mood and Affect: Mood normal.        Behavior: Behavior normal.     Ortho Exam  Imaging: No results found.  Past Medical/Family/Surgical/Social  History: Medications & Allergies reviewed per EMR, new medications updated. 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   Smoking 10/26/2021   Postoperative intra-abdominal abscess    Medication monitoring encounter    Pelvic infection in female 09/24/2021   Status post laparoscopic hysterectomy 09/11/2021   Cholelithiasis 01/13/2019   Pre-diabetes 11/15/2016   History of bilateral tubal ligation 09/28/2015   Tobacco abuse 09/28/2015   Past Medical History:  Diagnosis Date   Abnormal Pap smear of cervix    Anxiety  Depression    Dysmenorrhea    Eczema    History of COVID-19 10/24/2020   loss of taste and smellbody aches and cough x 4 days all symptoms resolved   History of kidney stones    Hypertension    Pre-diabetes    PTSD (post-traumatic stress disorder)    Rash 08/31/2021   comes and goes on legs saw dermetology no rash now ? food allergy   SUI (stress urinary incontinence, female)    Trichomonas infection 02/2021   Uterine fibroid    Family History  Problem Relation Age of Onset   Hypertension Maternal Grandmother    Diabetes Maternal Grandfather    Allergic rhinitis Daughter    Allergic rhinitis Daughter    Allergic rhinitis Son    Allergic rhinitis Son    Colon cancer Neg Hx    Esophageal cancer Neg Hx    Rectal cancer Neg Hx    Stomach cancer Neg Hx    Past Surgical History:  Procedure Laterality Date   BLADDER SUSPENSION N/A 09/11/2021   Procedure: TRANSVAGINAL TAPE (TVT) PROCEDURE;  Surgeon: Patton Salles, MD;  Location: [facility] [place];  Service:  Gynecology;  Laterality: N/A;   CESAREAN SECTION  1997   x 1   CHOLECYSTECTOMY N/A 01/14/2019   Procedure: LAPAROSCOPIC CHOLECYSTECTOMY WITH POSSIBLE  INTRAOPERATIVE CHOLANGIOGRAM;  Surgeon: Darnell Level, MD;  Location: [facility];  Service: General;  Laterality: N/A;   CYSTOSCOPY N/A 09/11/2021   Procedure: Bluford Kaufmann;  Surgeon: Patton Salles, MD;  Location[facility];  Service: Gynecology;  Laterality: N/A;   CYSTOSCOPY/URETEROSCOPY/HOLMIUM LASER Left 01/31/2022   Procedure: LEFT URETEROSCOPY/HOLMIUM LASER;  Surgeon: Despina Arias, MD;  Location: [facility];  Service: Urology;  Laterality: Left;   DILITATION & CURRETTAGE/HYSTROSCOPY WITH HYDROTHERMAL ABLATION N/A 06/22/2016   Procedure: DILATATION & CURETTAGE/HYSTEROSCOPY WITH HYDROTHERMAL ABLATION;  Surgeon: Brock Bad, MD;  Location: [facility];  Service: Gynecology;  Laterality: N/A;   TOTAL LAPAROSCOPIC HYSTERECTOMY WITH SALPINGECTOMY Bilateral 09/11/2021   Procedure: TOTAL LAPAROSCOPIC HYSTERECTOMY WITH SALPINGECTOMY;  Surgeon: Patton Salles, MD;  Location[facility];  Service: Gynecology;  Laterality: Bilateral;   TUBAL LIGATION     09-28-2015   Social History   Occupational History   Not on file  Tobacco Use   Smoking status: Every Day    Packs/day: 0.50    Years: 20.00    Additional pack years: 0.00    Total pack years: 10.00    Types: Cigarettes    Passive exposure: Never   Smokeless tobacco: Never  Vaping Use   Vaping Use: Former  Substance and Sexual Activity   Alcohol use: Not Currently   Drug use: Never   Sexual activity: Not Currently    Partners: Male    Birth control/protection: Surgical    Comment: [facility] 09/11/21

## 2023-03-18 NOTE — Progress Notes (Unsigned)
She was seen at [facility] she was giving muscle relaxers and had an xray done.   Still having constant pain. Worse when she is bending down.  Pain goes down both legs. Describes pain as a cramp.  No known injury to the back to cause pain.  Pain wakes her from sleep at times. No pain while sitting.

## 2023-03-19 ENCOUNTER — Encounter: Payer: Self-pay | Admitting: Family

## 2023-03-19 ENCOUNTER — Other Ambulatory Visit: Payer: Self-pay | Admitting: Family

## 2023-03-19 DIAGNOSIS — G4733 Obstructive sleep apnea (adult) (pediatric): Secondary | ICD-10-CM | POA: Diagnosis not present

## 2023-03-19 DIAGNOSIS — L309 Dermatitis, unspecified: Secondary | ICD-10-CM

## 2023-03-19 DIAGNOSIS — Z91018 Allergy to other foods: Secondary | ICD-10-CM

## 2023-03-19 MED ORDER — EPINEPHRINE 0.3 MG/0.3ML IJ SOAJ
0.3000 mg | INTRAMUSCULAR | 2 refills | Status: DC | PRN
Start: 2023-03-19 — End: 2023-03-27

## 2023-03-19 NOTE — Telephone Encounter (Signed)
Complete

## 2023-03-19 NOTE — Progress Notes (Signed)
Patient ID: Brianna Gutierrez, female    DOB: 18-May-1976  MRN: [id number]  CC: Chronic Care Management   Subjective: Brianna Gutierrez Brianna Gutierrez is a 47 y.o. female who presents for chronic care management.   Her concerns today include:  - Doing well on Amlodipine and Hydrochlorothiazide, no issues/concerns. Checking blood pressure outside of office. The patient does not complain of red flag symptoms such as but not limited to chest pain, shortness of breath, worst headache of life, nausea/vomiting.  - Reports her health insurance does not cover cost of Epi Pen. Reports pharmacy told her that she has to pay $277 out of pocket to meet deductible before they will pay. She would like to know if there are any financial resources that may be able to assist with cost. I informed patient I will consult with our clinic pharmacist to see if there are any financial resources available. Patient agreeable. - No further issues/concerns for discussion today.  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 30  tablet 0   albuterol (VENTOLIN HFA) 108 (90 Base) MCG/ACT inhaler Inhale 2 puffs into the lungs every 6 (six) hours as needed for wheezing or shortness of breath. 8 g 2   baclofen (LIORESAL) 10 MG tablet Take 1 tablet (10 mg total) by mouth 3 (three) times daily. 30 each 0   EPINEPHrine (EPIPEN 2-PAK) 0.3 mg/0.3 mL IJ SOAJ injection Inject 0.3 mg into the muscle as needed for anaphylaxis. 2 each 2   famotidine (PEPCID) 20 MG tablet Take 1 tablet (20 mg total) by mouth 2 (two) times daily. 60 tablet 3   ferrous sulfate 325 (65 FE) MG EC tablet Take 1 tablet by mouth once every other day with a source of vitamin C. 30 tablet 6   fexofenadine (ALLEGRA) 180 MG tablet Take 2 tablets twice a day for hives. 120 tablet 2   gabapentin (NEURONTIN) 300 MG capsule Take 1 capsule (300 mg total) by mouth at bedtime. 30 capsule 2   ibuprofen (ADVIL) 600 MG tablet Take 1 tablet (600 mg total) by mouth every 6 (six) hours as needed. 30 tablet 0   lamoTRIgine (LAMICTAL) 100 MG tablet Take 1 tablet (100 mg total) by mouth daily. 30 tablet 1   meloxicam (MOBIC) 15 MG tablet Take 1 tablet (15 mg total) by mouth daily. 30 tablet 0   metFORMIN (GLUCOPHAGE) 500 MG tablet Take 1 tablet (500 mg total) by mouth 2 (two) times daily with a meal. 180 tablet 0   methocarbamol (ROBAXIN) 500 MG tablet Take 1 tablet (500 mg total) by mouth 2 (two) times daily. 20 tablet 0  traZODone (DESYREL) 100 MG tablet Take 1 tablet (100 mg total) by mouth at bedtime as needed for sleep. Take an additional 50 mg (1/2 tab) as needed 135 tablet 0   XOLAIR 150 MG/ML prefilled syringe Inject 300 mg into the skin every 28 (twenty-eight) days.     [DISCONTINUED] dicyclomine (BENTYL) 20 MG tablet Take 1 tablet (20 mg total) by mouth 2 (two) times daily. 20 tablet 0   [DISCONTINUED] omeprazole (PRILOSEC) 20 MG capsule Take 1 capsule (20 mg total) by mouth daily. 30 capsule 1   Current Facility-Administered Medications on File Prior to Visit   Medication Dose Route Frequency Provider Last Rate Last Admin   omalizumab Geoffry Paradise) prefilled syringe 300 mg  300 mg Subcutaneous Q28 days Nehemiah Settle, FNP   300 mg at 02/25/23 1536    Allergies  Allergen Reactions   Pork-Derived Products    Tramadol     rash   Tramadol Other (See Comments)    Headache    Social History   Socioeconomic History   Marital status: Married    Spouse name: Not on file   Number of children: Not on file   Years of education: Not on file   Highest education level: 12th grade  Occupational History   Not on file  Tobacco Use   Smoking status: Every Day    Packs/day: 0.50    Years: 20.00    Additional pack years: 0.00    Total pack years: 10.00    Types: Cigarettes    Passive exposure: Never   Smokeless tobacco: Never  Vaping Use   Vaping Use: Former  Substance and Sexual Activity   Alcohol use: Not Currently   Drug use: Never   Sexual activity: Not Currently    Partners: Male    Birth control/protection: Surgical    Comment: TLH 09/11/21  Other Topics Concern   Not on file  Social History Narrative   ** Merged History Encounter **       Social Determinants of Health   Financial Resource Strain: Medium Risk (02/22/2023)   Overall Financial Resource Strain (CARDIA)    Difficulty of Paying Living Expenses: Somewhat hard  Food Insecurity: Food Insecurity Present (02/22/2023)   Hunger Vital Sign    Worried About Running Out of Food in the Last Year: Sometimes true    Ran Out of Food in the Last Year: Sometimes true  Transportation Needs: No Transportation Needs (02/22/2023)   PRAPARE - Administrator, Civil Service (Medical): No    Lack of Transportation (Non-Medical): No  Physical Activity: Inactive (02/22/2023)   Exercise Vital Sign    Days of Exercise per Week: 1 day    Minutes of Exercise per Session: 0 min  Stress: Stress Concern Present (02/22/2023)   [employer] of Occupational Health - Occupational Stress  Questionnaire    Feeling of Stress : Very much  Social Connections: Socially Isolated (02/22/2023)   Social Connection and Isolation Panel [NHANES]    Frequency of Communication with Friends and Family: More than three times a week    Frequency of Social Gatherings with Friends and Family: Once a week    Attends Religious Services: Never    Database administrator or Organizations: No    Attends Engineer, structural: Not on file    Marital Status: Separated  Intimate Partner Violence: Not on file    Family History  Problem Relation Age of Onset   Hypertension Maternal Grandmother  Diabetes Maternal Grandfather    Allergic rhinitis Daughter    Allergic rhinitis Daughter    Allergic rhinitis Son    Allergic rhinitis Son    Colon cancer Neg Hx    Esophageal cancer Neg Hx    Rectal cancer Neg Hx    Stomach cancer Neg Hx     Past Surgical History:  Procedure Laterality Date   BLADDER SUSPENSION N/A 09/11/2021   Procedure: TRANSVAGINAL TAPE (TVT) PROCEDURE;  Surgeon: Patton Salles, MD;  Location: [facility];  Service: Gynecology;  Laterality: N/A;   CESAREAN SECTION  1997   x 1   CHOLECYSTECTOMY N/A 01/14/2019   Procedure: LAPAROSCOPIC CHOLECYSTECTOMY WITH POSSIBLE  INTRAOPERATIVE CHOLANGIOGRAM;  Surgeon: Darnell Level, MD;  Location: [facility];  Service: General;  Laterality: N/A;   CYSTOSCOPY N/A 09/11/2021   Procedure: Bluford Kaufmann;  Surgeon: Patton Salles, MD;  Location: [facility];  Service: Gynecology;  Laterality: N/A;   CYSTOSCOPY/URETEROSCOPY/HOLMIUM LASER Left 01/31/2022   Procedure: LEFT URETEROSCOPY/HOLMIUM LASER;  Surgeon: Despina Arias, MD;  Location: [facility];  Service: Urology;  Laterality: Left;   DILITATION & CURRETTAGE/HYSTROSCOPY WITH HYDROTHERMAL ABLATION N/A 06/22/2016   Procedure: DILATATION & CURETTAGE/HYSTEROSCOPY WITH HYDROTHERMAL ABLATION;  Surgeon: Brock Bad, MD;  Location: [facility];  Service:  Gynecology;  Laterality: N/A;   TOTAL LAPAROSCOPIC HYSTERECTOMY WITH SALPINGECTOMY Bilateral 09/11/2021   Procedure: TOTAL LAPAROSCOPIC HYSTERECTOMY WITH SALPINGECTOMY;  Surgeon: Patton Salles, MD;  Location[facility];  Service: Gynecology;  Laterality: Bilateral;   TUBAL LIGATION     09-28-2015    ROS: Review of Systems Negative except as stated above  PHYSICAL EXAM: BP 118/78 (BP Location: Right Arm, Patient Position: Sitting, Cuff Size: Large)   Pulse 100   Temp 98.6 F (37 C)   Resp 16   Ht 5\' 2"  (1.575 m)   Wt 196 lb 9.6 oz (89.2 kg)   LMP  (LMP Unknown)   SpO2 98%   BMI 35.96 kg/m   Physical Exam HENT:     Head: Normocephalic and atraumatic.  Eyes:     Extraocular Movements: Extraocular movements intact.     Conjunctiva/sclera: Conjunctivae normal.     Pupils: Pupils are equal, round, and reactive to light.  Cardiovascular:     Rate and Rhythm: Normal rate and regular rhythm.     Pulses: Normal pulses.     Heart sounds: Normal heart sounds.  Pulmonary:     Effort: Pulmonary effort is normal.     Breath sounds: Normal breath sounds.  Musculoskeletal:     Cervical back: Normal range of motion and neck supple.  Neurological:     General: No focal deficit present.     Mental Status: She is alert and oriented to person, place, and time.  Psychiatric:        Mood and Affect: Mood normal.        Behavior: Behavior normal.     ASSESSMENT AND PLAN: 1. Primary hypertension - Continue Amlodipine and Hydrochlorothiazide as prescribed.  - Routine screening.  - Counseled on blood pressure goal of less than 130/80, low-sodium, DASH diet, medication compliance, and 150 minutes of moderate intensity exercise per week as tolerated. Counseled on medication adherence and adverse effects. - Follow-up with primary provider in 3 months or sooner if needed. - Basic Metabolic Panel - amLODipine (NORVASC) 10 MG tablet; Take 1 tablet (10 mg total) by  mouth every evening.  Dispense: 90 tablet; Refill: 0 - hydrochlorothiazide (HYDRODIURIL) 25 MG tablet; Take 1 tablet (25 mg total) by mouth daily.  Dispense: 90 tablet; Refill: 0   Patient was given the opportunity to ask questions.  Patient verbalized understanding of the plan and was able to repeat key elements of the plan. Patient was given clear instructions to go to Emergency Department or return to medical center if symptoms don't improve, worsen, or new problems develop.The patient verbalized understanding.   Orders Placed This Encounter  Procedures   Basic Metabolic Panel     Requested Prescriptions   Signed Prescriptions Disp Refills   amLODipine (NORVASC) 10 MG tablet 90 tablet 0    Sig: Take 1 tablet (10 mg total) by mouth every evening.   hydrochlorothiazide (HYDRODIURIL) 25 MG tablet 90 tablet 0    Sig: Take 1 tablet (25 mg total) by mouth daily.    Return in about 3 months (around 06/26/2023) for Follow-Up or next available chronic care mgmt .  [name], NP

## 2023-03-20 NOTE — Telephone Encounter (Signed)
Medication request completed 05/21

## 2023-03-21 ENCOUNTER — Ambulatory Visit (HOSPITAL_COMMUNITY): Payer: BC Managed Care – PPO | Admitting: Clinical

## 2023-03-21 ENCOUNTER — Encounter (HOSPITAL_COMMUNITY): Payer: Self-pay

## 2023-03-26 ENCOUNTER — Telehealth: Payer: Self-pay | Admitting: Family

## 2023-03-26 ENCOUNTER — Ambulatory Visit (INDEPENDENT_AMBULATORY_CARE_PROVIDER_SITE_OTHER): Payer: BC Managed Care – PPO | Admitting: Family

## 2023-03-26 ENCOUNTER — Encounter: Payer: Self-pay | Admitting: Family

## 2023-03-26 VITALS — BP 118/78 | HR 100 | Temp 98.6°F | Resp 16 | Ht 62.0 in | Wt 196.6 lb

## 2023-03-26 DIAGNOSIS — I10 Essential (primary) hypertension: Secondary | ICD-10-CM

## 2023-03-26 MED ORDER — AMLODIPINE BESYLATE 10 MG PO TABS
10.0000 mg | ORAL_TABLET | Freq: Every evening | ORAL | 0 refills | Status: DC
Start: 2023-03-26 — End: 2023-06-02

## 2023-03-26 MED ORDER — HYDROCHLOROTHIAZIDE 25 MG PO TABS
25.0000 mg | ORAL_TABLET | Freq: Every day | ORAL | 0 refills | Status: DC
Start: 2023-03-26 — End: 2023-06-02

## 2023-03-26 NOTE — Progress Notes (Signed)
Pt is here for chronic care mgt   Requesting information about how to get her epi pen covered by insurance or assistance programs

## 2023-03-27 ENCOUNTER — Other Ambulatory Visit: Payer: Self-pay | Admitting: Family

## 2023-03-27 ENCOUNTER — Other Ambulatory Visit: Payer: Self-pay

## 2023-03-27 ENCOUNTER — Ambulatory Visit (INDEPENDENT_AMBULATORY_CARE_PROVIDER_SITE_OTHER): Payer: BC Managed Care – PPO | Admitting: *Deleted

## 2023-03-27 DIAGNOSIS — L309 Dermatitis, unspecified: Secondary | ICD-10-CM

## 2023-03-27 DIAGNOSIS — Z91018 Allergy to other foods: Secondary | ICD-10-CM

## 2023-03-27 DIAGNOSIS — L501 Idiopathic urticaria: Secondary | ICD-10-CM | POA: Diagnosis not present

## 2023-03-27 LAB — BASIC METABOLIC PANEL
BUN/Creatinine Ratio: 18 (ref 9–23)
BUN: 20 mg/dL (ref 6–24)
CO2: 24 mmol/L (ref 20–29)
Calcium: 9.7 mg/dL (ref 8.7–10.2)
Chloride: 98 mmol/L (ref 96–106)
Creatinine, Ser: 1.11 mg/dL — ABNORMAL HIGH (ref 0.57–1.00)
Glucose: 156 mg/dL — ABNORMAL HIGH (ref 70–99)
Potassium: 3.6 mmol/L (ref 3.5–5.2)
Sodium: 138 mmol/L (ref 134–144)
eGFR: 62 mL/min/{1.73_m2} (ref >59)

## 2023-03-27 MED ORDER — EPINEPHRINE 0.3 MG/0.3ML IJ SOAJ
0.3000 mg | INTRAMUSCULAR | 2 refills | Status: DC | PRN
Start: 2023-03-27 — End: 2023-08-29
  Filled 2023-03-27: qty 2, 2d supply, fill #0

## 2023-03-27 NOTE — Telephone Encounter (Signed)
-   Franky Macho, I sent the prescription to your office pharmacy for patient pickup. Thank you for helping [place]! - Yvonna Alanis, if you please call patient and update her that prescription for Epi Pen is available for pickup at [facility] and [employer] Thank you.

## 2023-03-28 ENCOUNTER — Telehealth (HOSPITAL_BASED_OUTPATIENT_CLINIC_OR_DEPARTMENT_OTHER): Payer: BC Managed Care – PPO | Admitting: Psychiatry

## 2023-03-28 ENCOUNTER — Ambulatory Visit (INDEPENDENT_AMBULATORY_CARE_PROVIDER_SITE_OTHER): Payer: BC Managed Care – PPO | Admitting: Podiatry

## 2023-03-28 ENCOUNTER — Encounter (HOSPITAL_COMMUNITY): Payer: Self-pay | Admitting: Psychiatry

## 2023-03-28 DIAGNOSIS — F321 Major depressive disorder, single episode, moderate: Secondary | ICD-10-CM

## 2023-03-28 DIAGNOSIS — F4310 Post-traumatic stress disorder, unspecified: Secondary | ICD-10-CM

## 2023-03-28 DIAGNOSIS — R52 Pain, unspecified: Secondary | ICD-10-CM

## 2023-03-28 DIAGNOSIS — L600 Ingrowing nail: Secondary | ICD-10-CM | POA: Diagnosis not present

## 2023-03-28 MED ORDER — CEPHALEXIN 500 MG PO CAPS: 500.0000 mg | ORAL_CAPSULE | Freq: Three times a day (TID) | ORAL | 0 refills | Status: DC

## 2023-03-28 MED ORDER — LAMOTRIGINE 100 MG PO TABS
100.0000 mg | ORAL_TABLET | Freq: Every day | ORAL | 0 refills | Status: DC
Start: 2023-03-28 — End: 2023-05-09

## 2023-03-28 MED ORDER — TRAZODONE HCL 100 MG PO TABS
100.0000 mg | ORAL_TABLET | Freq: Every evening | ORAL | 0 refills | Status: AC | PRN
Start: 2023-03-28 — End: ?
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
## 2023-03-28 NOTE — Patient Instructions (Signed)

## 2023-04-01 NOTE — Therapy (Signed)
OUTPATIENT PHYSICAL THERAPY EVALUATION   Patient Name: Brianna Gutierrez MRN: [id number] DOB:04-06-1976, 47 y.o., female Today's Date: 04/02/2023  END OF SESSION:  PT End of Session - 04/02/23 1352     Visit Number 1    Number of Visits 20    Date for PT Re-Evaluation 06/11/23    Authorization Type [employer] 10% coinsurance - 60 visit    Progress Note Due on Visit 10    PT Start Time 1346    PT Stop Time 1416    PT Time Calculation (min) 30 min    Activity Tolerance Patient limited by pain    Behavior During Therapy [facility] for tasks assessed/performed             Past Medical History:  Diagnosis Date   Abnormal Pap smear of cervix    Anxiety    Depression    Dysmenorrhea    Eczema    History of COVID-19 10/24/2020   loss of taste and smellbody aches and cough x 4 days all symptoms resolved   History of kidney stones    Hypertension    Pre-diabetes    PTSD (post-traumatic stress disorder)    Rash 08/31/2021   comes and goes on legs saw dermetology no rash now ? food allergy   SUI (stress urinary incontinence, female)    Trichomonas infection 02/2021   Uterine fibroid    Past Surgical History:  Procedure Laterality Date   BLADDER SUSPENSION N/A 09/11/2021   Procedure: TRANSVAGINAL TAPE (TVT) PROCEDURE;  Surgeon: Patton Salles, MD;  Location: [facility];  Service: Gynecology;  Laterality: N/A;   CESAREAN SECTION  1997   x 1   CHOLECYSTECTOMY N/A 01/14/2019   Procedure: LAPAROSCOPIC CHOLECYSTECTOMY WITH POSSIBLE  INTRAOPERATIVE CHOLANGIOGRAM;  Surgeon: Darnell Level, MD;  Location: [facility];  Service: General;  Laterality: N/A;   CYSTOSCOPY N/A 09/11/2021   Procedure: Bluford Kaufmann;  Surgeon: Patton Salles, MD;  Location: [facility];  Service: Gynecology;  Laterality: N/A;   CYSTOSCOPY/URETEROSCOPY/HOLMIUM LASER Left 01/31/2022   Procedure: LEFT URETEROSCOPY/HOLMIUM LASER;  Surgeon: Despina Arias, MD;  Location: [facility];   Service: Urology;  Laterality: Left;   DILITATION & CURRETTAGE/HYSTROSCOPY WITH HYDROTHERMAL ABLATION N/A 06/22/2016   Procedure: DILATATION & CURETTAGE/HYSTEROSCOPY WITH HYDROTHERMAL ABLATION;  Surgeon: Brock Bad, MD;  Location: [facility];  Service: Gynecology;  Laterality: N/A;   TOTAL LAPAROSCOPIC HYSTERECTOMY WITH SALPINGECTOMY Bilateral 09/11/2021   Procedure: TOTAL LAPAROSCOPIC HYSTERECTOMY WITH SALPINGECTOMY;  Surgeon: Patton Salles, MD;  Location: [facility];  Service: Gynecology;  Laterality: Bilateral;   TUBAL LIGATION     09-28-2015   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   Smoking 10/26/2021   Postoperative intra-abdominal abscess    Medication monitoring encounter    Pelvic infection in female 09/24/2021   Status post laparoscopic hysterectomy 09/11/2021   Cholelithiasis 01/13/2019   Pre-diabetes 11/15/2016   History of bilateral tubal ligation 09/28/2015   Tobacco abuse 09/28/2015    PCP: [name] NP  REFERRING PROVIDER: Juanda Chance, NP  REFERRING DIAG: [phone] (ICD-10-CM) -  Chronic bilateral low back pain without sciatica M79.18 (ICD-10-CM) - Myofascial pain syndrome M47.819 (ICD-10-CM) - Spondylosis without myelopathy or radiculopathy  Rationale for Evaluation and Treatment: Rehabilitation  THERAPY DIAG:  Other low back pain  Difficulty in walking, not elsewhere classified  Pain in left hip  ONSET DATE: 01/2023 "a couple months"  SUBJECTIVE:                                                                                                                                                                                            SUBJECTIVE STATEMENT: She indicated complaints in lower back with bending, walking.  She indicated bad cramp with bending down and having to stay still until pain reduces.    Pt indicated insidious onset of symptoms a couple months ago.    She indicated lying down can be painful, sitting prolonged at times as well.  She indicated complaints after work on feet all day.   Pt indicated work at [employer] with complaints with bending to lift activity.   No numbness/tingling, bowel or bladder changes.    Disrupted sleep due to symptoms.   She indicated trying medicine, heating pad, massage with minimal long term relief.   Indicated trying to walk for exercise up to 1 hr.    PERTINENT HISTORY:  Anxiety, depression, HTN, PTSD,   PAIN:  NPRS scale: current 5/10, at worst 10 /10 Pain location: back pain, sometimes into Lt hip posterior Pain description: grabbing, cramps Aggravating factors: bending, walking Relieving factors: medicine  PRECAUTIONS: None  WEIGHT BEARING RESTRICTIONS: No  FALLS:  Has patient fallen in last 6 months? No  LIVING ENVIRONMENT: Lives in: House/apartment  OCCUPATION: Works at [employer]  PLOF: Independent, read, watch TV  PATIENT GOALS: Reduce pain, improve work  OBJECTIVE:   PATIENT SURVEYS:  04/02/2023 FOTO eval:  44  predicted:  63  SCREENING FOR RED FLAGS: 04/02/2023 Bowel or bladder incontinence: No Cauda equina syndrome: No  COGNITION: 04/02/2023 Overall cognitive status: WFL normal      SENSATION: 04/02/2023 WFL  MUSCLE LENGTH: 04/02/2023 Passive SLR Rt 90 deg, Lt 60 deg c pain  POSTURE:  04/02/2023 Mild increase in lumbar lordosis in standing. No observed lateral shift.   PALPATION: 04/02/2023 Tenderness to light touch Lt lumbar, posterior/lateral Lt hip  LUMBAR ROM:   04/02/2023 : no centralization/peripheralization noted c lumbar  movement.   AROM 04/02/2023  Flexion To patella with pain in end range, gower's sign upon return.   Extension 25% WFL c ERP lumbar   Repeated x 5 in standing  similar complaints in end range, reduced complaints in standing  Right  lateral flexion   Left lateral flexion   Right rotation   Left rotation    (Blank rows = not tested)  LOWER EXTREMITY ROM:      Right 04/02/2023 Left 04/02/2023  Hip flexion    Hip extension    Hip abduction    Hip adduction    Hip internal rotation    Hip external rotation    Knee flexion    Knee extension    Ankle dorsiflexion    Ankle plantarflexion    Ankle inversion    Ankle eversion     (Blank rows = not tested)  LOWER EXTREMITY MMT:    MMT Right 04/02/2023 Left 04/02/2023  Hip flexion 5/5 4/5 c pain  Hip extension    Hip abduction    Hip adduction    Hip internal rotation    Hip external rotation    Knee flexion 5/5 5/5  Knee extension 5/5 5/5  Ankle dorsiflexion 5/5 5/5  Ankle plantarflexion    Ankle inversion    Ankle eversion     (Blank rows = not tested)  LUMBAR SPECIAL TESTS:  04/02/2023 (-) slump bilateral, (-) crossed slr bilateral)  FUNCTIONAL TESTS:  04/02/2023 18 inch chair transfer s UE assist on 1st try without pain increase.   GAIT: 04/02/2023 Independence   TODAY'S TREATMENT:                                                                                   DATE: 04/02/2023  Therex:    HEP instruction/performance c cues for techniques, handout provided.  Trial set performed of each for comprehension and symptom assessment.  See below for exercise list  PATIENT EDUCATION:  04/02/2023 Education details: HEP, POC Person educated: Patient Education method: Explanation, Demonstration, Verbal cues, and Handouts Education comprehension: verbalized understanding, returned demonstration, and verbal cues required  HOME EXERCISE PROGRAM: Access Code: [email or web address] URL: [email or web address] Date:  04/02/2023 Prepared by: Chyrel Masson  Exercises - Standing Lumbar Extension with Counter  - 3-5 x daily - 7 x weekly - 1 sets - 5-10 reps - Supine Lower Trunk Rotation  - 2-3 x daily - 7 x weekly - 1 sets - 3-5 reps - 15 hold - Supine Piriformis Stretch with Foot on Ground (Mirrored)  - 2 x daily - 7 x weekly - 1 sets - 5 reps - 15-30 hold - Supine Figure 4 Piriformis Stretch (Mirrored)  - 2 x daily - 7 x weekly - 1 sets - 5 reps - 15-30 hold  ASSESSMENT:  CLINICAL IMPRESSION: Patient is a 47 y.o. who comes to clinic with complaints of low back pain, Lt posterior hip pain with mobility, strength and movement coordination deficits that impair their ability to perform usual daily and recreational functional activities without increase difficulty/symptoms at this time.  Patient to benefit from skilled PT services to address impairments and limitations to improve to previous level of function without restriction secondary to condition.   OBJECTIVE IMPAIRMENTS: decreased activity tolerance, decreased coordination, decreased endurance, decreased mobility, difficulty walking, decreased ROM, decreased strength, hypomobility, increased fascial restrictions, impaired perceived functional ability, increased muscle spasms, impaired flexibility, improper body mechanics, and pain.   ACTIVITY  LIMITATIONS: carrying, lifting, bending, sitting, standing, squatting, sleeping, stairs, transfers, bed mobility, reach over head, and locomotion level  PARTICIPATION LIMITATIONS: meal prep, cleaning, laundry, interpersonal relationship, driving, shopping, community activity, and occupation  PERSONAL FACTORS:  Anxiety, depression, HTN, PTSD,   are also affecting patient's functional outcome.   REHAB POTENTIAL: Good  CLINICAL DECISION MAKING: Stable/uncomplicated  EVALUATION COMPLEXITY: Low   GOALS: Goals reviewed with patient? Yes  SHORT TERM GOALS: (target date for Short term goals are 3 weeks  04/23/2023)  1. Patient will demonstrate independent use of home exercise program to maintain progress from in clinic treatments.  Goal status: New  LONG TERM GOALS: (target dates for all long term goals are 10 weeks  06/11/2023 )   1. Patient will demonstrate/report pain at worst less than or equal to 2/10 to facilitate minimal limitation in daily activity secondary to pain symptoms.  Goal status: New   2. Patient will demonstrate independent use of home exercise program to facilitate ability to maintain/progress functional gains from skilled physical therapy services.  Goal status: New   3. Patient will demonstrate FOTO outcome > or = 63 % to indicate reduced disability due to condition.  Goal status: New   4. Patient will demonstrate lumbar extension 100 % WFL s symptoms to facilitate upright standing, walking posture at PLOF s limitation.  Goal status: New   5.  Patient will demonstrate bilateral LE MMT 5/5 throughout to facilitate usual lifting, carrying for home and work activity.   Goal status: New   6.  Patient will demonstrate/report ability to sleep s restriction due to symptoms.  Goal status: New   7.  Patient will demonstrate ability to stand/walk s restriction due to symptoms for exercise and work.  Goal Status: New  PLAN:  PT FREQUENCY: 1-2x/week  PT DURATION: 10 weeks  PLANNED INTERVENTIONS: Therapeutic exercises, Therapeutic activity, Neuro Muscular re-education, Balance training, Gait training, Patient/Family education, Joint mobilization, Stair training, DME instructions, Dry Needling, Electrical stimulation, Cryotherapy, vasopneumatic device, Moist heat, Taping, Traction Ultrasound, Ionotophoresis 4mg /ml Dexamethasone, and aquatic therapy, Manual therapy.  All included unless contraindicated  PLAN FOR NEXT SESSION: Review HEP knowledge/results.    Chyrel Masson, PT, DPT, OCS, ATC 04/02/23  2:38 PM

## 2023-04-02 ENCOUNTER — Encounter: Payer: Self-pay | Admitting: Rehabilitative and Restorative Service Providers"

## 2023-04-02 ENCOUNTER — Other Ambulatory Visit: Payer: Self-pay

## 2023-04-02 ENCOUNTER — Ambulatory Visit (INDEPENDENT_AMBULATORY_CARE_PROVIDER_SITE_OTHER): Payer: BC Managed Care – PPO | Admitting: Rehabilitative and Restorative Service Providers"

## 2023-04-02 DIAGNOSIS — M5459 Other low back pain: Secondary | ICD-10-CM | POA: Diagnosis not present

## 2023-04-02 DIAGNOSIS — M25552 Pain in left hip: Secondary | ICD-10-CM | POA: Diagnosis not present

## 2023-04-02 DIAGNOSIS — R262 Difficulty in walking, not elsewhere classified: Secondary | ICD-10-CM

## 2023-04-02 NOTE — Progress Notes (Signed)
Subjective: Chief Complaint  Patient presents with   Nail Problem   48 year old female presents the office for above concerns, which is new.  She states that she has an ingrown toenail is causing quite a bit of discomfort.  She does not report any drainage or pus at this time.  Nails also thick which cause discomfort.  She has previously been treated with Lamisil.  Objective: AAO x3, NAD DP/PT pulses palpable bilaterally, CRT less than 3 seconds On the right hallux and there is incurvation of the nail border with localized edema there is tenderness palpation.  There is no erythema or warmth.  There is no drainage or pus or ascending cellulitis.  No fluctuation or crepitation. No pain with calf compression, swelling, warmth, erythema  Assessment: Ingrown toenail right hallux  Plan: -All treatment options discussed with the patient including all alternatives, risks, complications.  -At this time, the patient is requesting partial nail removal with chemical matricectomy to the symptomatic portion of the nail. Risks and complications were discussed with the patient for which they understand and written consent was obtained. Under sterile conditions a total of 3 mL of a mixture of 2% lidocaine plain and 0.5% Marcaine plain was infiltrated in a hallux block fashion. Once anesthetized, the skin was prepped in sterile fashion. A tourniquet was then applied. Next the symptomatic boarder of hallux nail border was then sharply excised making sure to remove the entire offending nail border. Once the nails were ensured to be removed area was debrided and the underlying skin was intact. There is no purulence identified in the procedure. Next phenol was then applied under standard conditions and copiously irrigated.  Silvadene was applied. A dry sterile dressing was applied. After application of the dressing the tourniquet was removed and there is found to be an immediate capillary refill time to the digit. The  patient tolerated the procedure well any complications. Post procedure instructions were discussed the patient for which he verbally understood. Discussed signs/symptoms of infection and directed to call the office immediately should any occur or go directly to the emergency room. In the meantime, encouraged to call the office with any questions, concerns, changes symptoms. -Patient encouraged to call the office with any questions, concerns, change in symptoms.   Vivi Barrack DPM

## 2023-04-04 ENCOUNTER — Ambulatory Visit (INDEPENDENT_AMBULATORY_CARE_PROVIDER_SITE_OTHER): Payer: BC Managed Care – PPO | Admitting: Clinical

## 2023-04-04 ENCOUNTER — Encounter (HOSPITAL_COMMUNITY): Payer: Self-pay | Admitting: Clinical

## 2023-04-04 ENCOUNTER — Ambulatory Visit (INDEPENDENT_AMBULATORY_CARE_PROVIDER_SITE_OTHER): Payer: BC Managed Care – PPO | Admitting: Podiatry

## 2023-04-04 DIAGNOSIS — B351 Tinea unguium: Secondary | ICD-10-CM | POA: Diagnosis not present

## 2023-04-04 DIAGNOSIS — F4310 Post-traumatic stress disorder, unspecified: Secondary | ICD-10-CM | POA: Diagnosis not present

## 2023-04-04 DIAGNOSIS — F331 Major depressive disorder, recurrent, moderate: Secondary | ICD-10-CM

## 2023-04-04 MED ORDER — FLUCONAZOLE 150 MG PO TABS: 150.0000 mg | ORAL_TABLET | ORAL | 0 refills | Status: DC

## 2023-04-04 MED ORDER — EFINACONAZOLE 10 % EX SOLN: 1.0000 [drp] | Freq: Every day | CUTANEOUS | 11 refills | Status: DC

## 2023-04-04 NOTE — Progress Notes (Signed)
Subjective: Chief Complaint  Patient presents with   Ingrown Toenail    Nail check     47 year old female presents the above concerns.  "Little sore" but it is getting better. No drainge now.  She is concerned about the thickening of the toenail.  Objective: AAO x3, NAD DP/PT pulses palpable bilaterally, CRT less than 3 seconds Post partial nail avulsion which is healing well.  Minimal scabbing is present but appears to be healed otherwise.  There is no swelling, redness or drainage to the nails hypertrophic, dystrophic with subungual debris.  Consistent with onychomycosis. No pain with calf compression, swelling, warmth, erythema  Assessment: Status post partial nail avulsion healing well; onychomycosis  Plan: -All treatment options discussed with the patient including all alternatives, risks, complications.  -Procedure site is healing well.  Monitor any signs or symptoms of infection or reoccurrence. -Discussed treatment options for nail fungus, duration of use/success rates.  After discussion she was proceed with topical.  Prescribed Jublia. -Patient encouraged to call the office with any questions, concerns, change in symptoms.   Vivi Barrack DPM

## 2023-04-04 NOTE — Patient Instructions (Signed)
Fluconazole Tablets What is this medication? FLUCONAZOLE (floo KON na zole) prevents and treats fungal or yeast infections. It belongs to a group of medications called antifungals. It will not prevent or treat colds, the flu, or infections caused by bacteria or viruses. This medicine may be used for other purposes; ask your health care provider or pharmacist if you have questions. COMMON BRAND NAME(S): Diflucan What should I tell my care team before I take this medication? They need to know if you have any of these conditions: Irregular heartbeat or rhythm Kidney disease Liver disease Low levels of potassium in the blood An unusual or allergic reaction to fluconazole, other medications, foods, dyes, or preservatives Pregnant or trying to get pregnant Breastfeeding How should I use this medication? Take this medication by mouth. Take it as directed on the prescription label at the same time every day. You can take it with or without food. If it upsets your stomach, take it with food. Take all of this medication unless your care team tells you to stop it early. Keep taking it even if you think you are better. Talk to your care team about the use of this medication in children. While it may be prescribed for children as young as newborns for selected conditions, precautions do apply. Overdosage: If you think you have taken too much of this medicine contact a poison control center or emergency room at once. NOTE: This medicine is only for you. Do not share this medicine with others. What if I miss a dose? If you miss a dose, take it as soon as you can. If it is almost time for your next dose, take only that dose. Do not take double or extra doses. What may interact with this medication? Do not take this medication with any of the following: Adagrasib Flibanserin Lomitapide Lonafarnib Other medications that cause heart rhythm changes Triazolam This medication may also interact with the  following: Abrocitinib Certain antibiotics, such as rifabutin or rifampin Certain antivirals for HIV or hepatitis Certain medications for blood pressure, heart disease, irregular heartbeat Certain medications for cholesterol, such as atorvastatin, lovastatin, simvastatin Certain medications for depression, such as amitriptyline or nortriptyline Certain medications for diabetes, such as glipizide or glyburide Certain medications for seizures, such as carbamazepine or phenytoin Certain medications that treat or prevent blood clots, such as warfarin Certain opioid medications for pain, such as alfentanil, fentanyl, methadone Cyclophosphamide Cyclosporine Ibrutinib Lemborexant Midazolam NSAIDS, medications for pain and inflammation, such as ibuprofen or naproxen Olaparib Sirolimus Steroid medications, such as prednisone Tacrolimus Theophylline Tofacitinib Tolvaptan Vinblastine Vincristine Vitamin A Voriconazole This list may not describe all possible interactions. Give your health care provider a list of all the medicines, herbs, non-prescription drugs, or dietary supplements you use. Also tell them if you smoke, drink alcohol, or use illegal drugs. Some items may interact with your medicine. What should I watch for while using this medication? Visit your care team for regular checkups. If you are taking this medication for a long time you may need blood work. Tell your care team if your symptoms do not improve. Some fungal infections need many weeks or months of treatment to cure. Alcohol can increase possible damage to your liver. Avoid alcoholic drinks. If you have a vaginal infection, do not have sex until you have finished your treatment. You can wear a sanitary napkin. Do not use tampons. Wear freshly washed cotton, not synthetic, panties. What side effects may I notice from receiving this medication? Side effects that   you should report to your care team as soon as  possible: Allergic reactions--skin rash, itching, hives, swelling of the face, lips, tongue, or throat Heart rhythm changes--fast or irregular heartbeat, dizziness, feeling faint or lightheaded, chest pain, trouble breathing Liver injury--right upper belly pain, loss of appetite, nausea, light-colored stool, dark yellow or brown urine, yellowing skin or eyes, unusual weakness or fatigue Low adrenal gland function--nausea, vomiting, loss of appetite, unusual weakness or fatigue, dizziness Rash, fever, and swollen lymph nodes Redness, blistering, peeling, or loosening of the skin, including inside the mouth Seizures Side effects that usually do not require medical attention (report to your care team if they continue or are bothersome): Change in taste Diarrhea Dizziness Headache Nausea Stomach pain This list may not describe all possible side effects. Call your doctor for medical advice about side effects. You may report side effects to FDA at 1-800-FDA-1088. Where should I keep my medication? Keep out of the reach of children. Store at room temperature below 30 degrees C (86 degrees F). Throw away any medication after the expiration date. NOTE: This sheet is a summary. It may not cover all possible information. If you have questions about this medicine, talk to your doctor, pharmacist, or health care provider.  2024 Elsevier/Gold Standard (2022-12-19 00:00:00)  
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
## 2023-04-05 ENCOUNTER — Ambulatory Visit: Payer: BC Managed Care – PPO | Admitting: Podiatry

## 2023-04-08 ENCOUNTER — Ambulatory Visit (INDEPENDENT_AMBULATORY_CARE_PROVIDER_SITE_OTHER): Payer: BC Managed Care – PPO | Admitting: Physical Therapy

## 2023-04-08 ENCOUNTER — Encounter: Payer: Self-pay | Admitting: Physical Therapy

## 2023-04-08 DIAGNOSIS — M5459 Other low back pain: Secondary | ICD-10-CM

## 2023-04-08 DIAGNOSIS — R262 Difficulty in walking, not elsewhere classified: Secondary | ICD-10-CM | POA: Diagnosis not present

## 2023-04-08 DIAGNOSIS — M25552 Pain in left hip: Secondary | ICD-10-CM

## 2023-04-08 NOTE — Therapy (Signed)
OUTPATIENT PHYSICAL THERAPY TREATMENT   Patient Name: Brianna Gutierrez MRN: [id number] DOB:1976/04/11, 47 y.o., female Today's Date: 04/08/2023  END OF SESSION:  PT End of Session - 04/08/23 1519     Visit Number 2    Number of Visits 20    Date for PT Re-Evaluation 06/11/23    Authorization Type [employer] 10% coinsurance - 60 visit    Progress Note Due on Visit 10    PT Start Time 1517    PT Stop Time 1555    PT Time Calculation (min) 38 min    Activity Tolerance Patient limited by pain    Behavior During Therapy [facility] for tasks assessed/performed             Past Medical History:  Diagnosis Date   Abnormal Pap smear of cervix    Anxiety    Depression    Dysmenorrhea    Eczema    History of COVID-19 10/24/2020   loss of taste and smellbody aches and cough x 4 days all symptoms resolved   History of kidney stones    Hypertension    Pre-diabetes    PTSD (post-traumatic stress disorder)    Rash 08/31/2021   comes and goes on legs saw dermetology no rash now ? food allergy   SUI (stress urinary incontinence, female)    Trichomonas infection 02/2021   Uterine fibroid    Past Surgical History:  Procedure Laterality Date   BLADDER SUSPENSION N/A 09/11/2021   Procedure: TRANSVAGINAL TAPE (TVT) PROCEDURE;  Surgeon: Patton Salles, MD;  Location: [facility];  Service: Gynecology;  Laterality: N/A;   CESAREAN SECTION  1997   x 1   CHOLECYSTECTOMY N/A 01/14/2019   Procedure: LAPAROSCOPIC CHOLECYSTECTOMY WITH POSSIBLE  INTRAOPERATIVE CHOLANGIOGRAM;  Surgeon: Darnell Level, MD;  Location: [facility];  Service: General;  Laterality: N/A;   CYSTOSCOPY N/A 09/11/2021   Procedure: Bluford Kaufmann;  Surgeon: Patton Salles, MD;  Location: [facility];  Service: Gynecology;  Laterality: N/A;   CYSTOSCOPY/URETEROSCOPY/HOLMIUM LASER Left 01/31/2022   Procedure: LEFT URETEROSCOPY/HOLMIUM LASER;  Surgeon: Despina Arias, MD;  Location: [facility];   Service: Urology;  Laterality: Left;   DILITATION & CURRETTAGE/HYSTROSCOPY WITH HYDROTHERMAL ABLATION N/A 06/22/2016   Procedure: DILATATION & CURETTAGE/HYSTEROSCOPY WITH HYDROTHERMAL ABLATION;  Surgeon: Brock Bad, MD;  Location: [facility];  Service: Gynecology;  Laterality: N/A;   TOTAL LAPAROSCOPIC HYSTERECTOMY WITH SALPINGECTOMY Bilateral 09/11/2021   Procedure: TOTAL LAPAROSCOPIC HYSTERECTOMY WITH SALPINGECTOMY;  Surgeon: Patton Salles, MD;  Location: [facility];  Service: Gynecology;  Laterality: Bilateral;   TUBAL LIGATION     09-28-2015   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   Smoking 10/26/2021   Postoperative intra-abdominal abscess    Medication monitoring encounter    Pelvic infection in female 09/24/2021   Status post laparoscopic hysterectomy 09/11/2021   Cholelithiasis 01/13/2019   Pre-diabetes 11/15/2016   History of bilateral tubal ligation 09/28/2015   Tobacco abuse 09/28/2015    PCP: [name] NP  REFERRING PROVIDER: Juanda Chance, NP  REFERRING DIAG: [phone] (ICD-10-CM) -  Chronic bilateral low back pain without sciatica M79.18 (ICD-10-CM) - Myofascial pain syndrome M47.819 (ICD-10-CM) - Spondylosis without myelopathy or radiculopathy  Rationale for Evaluation and Treatment: Rehabilitation  THERAPY DIAG:  Other low back pain  Difficulty in walking, not elsewhere classified  Pain in left hip  ONSET DATE: 01/2023 "a couple months"  SUBJECTIVE:                                                                                                                                                                                            SUBJECTIVE STATEMENT: She relays her back pain feels the same, it is more to left side, hurts the most after bending over. She says she has tried the exercises but they have not helped any.     PERTINENT HISTORY:  Anxiety, depression, HTN, PTSD,   PAIN:  NPRS scale: current 5/10, at worst 10 /10 Pain location: back pain, sometimes into Lt hip posterior Pain description: grabbing, cramps Aggravating factors: bending, walking Relieving factors: medicine  PRECAUTIONS: None  WEIGHT BEARING RESTRICTIONS: No  FALLS:  Has patient fallen in last 6 months? No  LIVING ENVIRONMENT: Lives in: House/apartment  OCCUPATION: Works at [employer]  PLOF: Independent, read, watch TV  PATIENT GOALS: Reduce pain, improve work  OBJECTIVE:   PATIENT SURVEYS:  04/02/2023 FOTO eval:  44  predicted:  63  SCREENING FOR RED FLAGS: 04/02/2023 Bowel or bladder incontinence: No Cauda equina syndrome: No  COGNITION: 04/02/2023 Overall cognitive status: WFL normal      SENSATION: 04/02/2023 WFL  MUSCLE LENGTH: 04/02/2023 Passive SLR Rt 90 deg, Lt 60 deg c pain  POSTURE:  04/02/2023 Mild increase in lumbar lordosis in standing. No observed lateral shift.   PALPATION: 04/02/2023 Tenderness to light touch Lt lumbar, posterior/lateral Lt hip  LUMBAR ROM:   04/02/2023 : no centralization/peripheralization noted c lumbar movement.   AROM 04/02/2023  Flexion To patella with pain in end range, gower's sign upon return.   Extension 25% WFL c ERP lumbar   Repeated x 5 in standing  similar complaints in end range, reduced complaints in standing  Right lateral flexion   Left lateral flexion   Right rotation   Left rotation    (Blank rows = not tested)  LOWER EXTREMITY ROM:      Right 04/02/2023 Left 04/02/2023  Hip flexion    Hip extension    Hip abduction    Hip adduction    Hip internal  rotation    Hip external rotation    Knee flexion    Knee extension    Ankle dorsiflexion    Ankle plantarflexion  Ankle inversion    Ankle eversion     (Blank rows = not tested)  LOWER EXTREMITY MMT:    MMT Right 04/02/2023 Left 04/02/2023  Hip flexion 5/5 4/5 c pain  Hip extension    Hip abduction    Hip adduction    Hip internal rotation    Hip external rotation    Knee flexion 5/5 5/5  Knee extension 5/5 5/5  Ankle dorsiflexion 5/5 5/5  Ankle plantarflexion    Ankle inversion    Ankle eversion     (Blank rows = not tested)  LUMBAR SPECIAL TESTS:  04/02/2023 (-) slump bilateral, (-) crossed slr bilateral)  FUNCTIONAL TESTS:  04/02/2023 18 inch chair transfer s UE assist on 1st try without pain increase.   GAIT: 04/02/2023 [place]   TODAY'S TREATMENT:                                                                                    DATE: 04/08/2023  Prone on elbows stretch 1 min X 3 (denies any changes or improvement in back pain) Manual therapy for active compression and skilled palpation and Trigger Point Dry-Needling  Treatment instructions: Expect mild to moderate muscle soreness. Patient Consent Given: Yes Education handout provided: verbally provided Muscles treated: Left sided lumbar paraspinals and multifidi, Lt QL.  Treatment response/outcome: good overall tolerance,twitch response noted  Supine Low trunk rotations 10 xec X 5 (cues not to stretch into the pain but to be gentle with these) Supine figure 4 piriformis stretch with contralateral leg on table 30 sec X 3 Supine figure 4 stretch knee to opposite shoulder 30 sec X 3 Supine pelvic tilts X 15 Supine bridges X 15 Standing lumbar extension 5 sec X 10  DATE: 04/02/2023  Therex:    HEP instruction/performance c cues for techniques, handout provided.  Trial set performed of each for comprehension and symptom assessment.  See below for exercise list  PATIENT EDUCATION:  04/02/2023 Education details:  HEP, POC Person educated: Patient Education method: Explanation, Demonstration, Verbal cues, and Handouts Education comprehension: verbalized understanding, returned demonstration, and verbal cues required  HOME EXERCISE PROGRAM: Access Code: [email or web address] URL: [email or web address] Date: 04/02/2023 Prepared by: Chyrel Masson  Exercises - Standing Lumbar Extension with Counter  - 3-5 x daily - 7 x weekly - 1 sets - 5-10 reps - Supine Lower Trunk Rotation  - 2-3 x daily - 7 x weekly - 1 sets - 3-5 reps - 15 hold - Supine Piriformis Stretch with Foot on Ground (Mirrored)  - 2 x daily - 7 x weekly - 1 sets - 5 reps - 15-30 hold - Supine Figure 4 Piriformis Stretch (Mirrored)  - 2 x daily - 7 x weekly - 1 sets - 5 reps - 15-30 hold  ASSESSMENT:  CLINICAL IMPRESSION: She has not expressed any improvement since eval however it will also take time with the exercises so she was encouraged to continue with them. I did try DN today to her left low back and we will see if this helps with any of her pain symptoms.   OBJECTIVE IMPAIRMENTS: decreased activity tolerance, decreased coordination, decreased endurance, decreased mobility, difficulty walking, decreased ROM,  decreased strength, hypomobility, increased fascial restrictions, impaired perceived functional ability, increased muscle spasms, impaired flexibility, improper body mechanics, and pain.   ACTIVITY LIMITATIONS: carrying, lifting, bending, sitting, standing, squatting, sleeping, stairs, transfers, bed mobility, reach over head, and locomotion level  PARTICIPATION LIMITATIONS: meal prep, cleaning, laundry, interpersonal relationship, driving, shopping, community activity, and occupation  PERSONAL FACTORS:  Anxiety, depression, HTN, PTSD,   are also affecting patient's functional outcome.   REHAB POTENTIAL: Good  CLINICAL DECISION MAKING: Stable/uncomplicated  EVALUATION COMPLEXITY: Low   GOALS: Goals reviewed with  patient? Yes  SHORT TERM GOALS: (target date for Short term goals are 3 weeks 04/23/2023)  1. Patient will demonstrate independent use of home exercise program to maintain progress from in clinic treatments.  Goal status: New  LONG TERM GOALS: (target dates for all long term goals are 10 weeks  06/11/2023 )   1. Patient will demonstrate/report pain at worst less than or equal to 2/10 to facilitate minimal limitation in daily activity secondary to pain symptoms.  Goal status: New   2. Patient will demonstrate independent use of home exercise program to facilitate ability to maintain/progress functional gains from skilled physical therapy services.  Goal status: New   3. Patient will demonstrate FOTO outcome > or = 63 % to indicate reduced disability due to condition.  Goal status: New   4. Patient will demonstrate lumbar extension 100 % WFL s symptoms to facilitate upright standing, walking posture at PLOF s limitation.  Goal status: New   5.  Patient will demonstrate bilateral LE MMT 5/5 throughout to facilitate usual lifting, carrying for home and work activity.   Goal status: New   6.  Patient will demonstrate/report ability to sleep s restriction due to symptoms.  Goal status: New   7.  Patient will demonstrate ability to stand/walk s restriction due to symptoms for exercise and work.  Goal Status: New  PLAN:  PT FREQUENCY: 1-2x/week  PT DURATION: 10 weeks  PLANNED INTERVENTIONS: Therapeutic exercises, Therapeutic activity, Neuro Muscular re-education, Balance training, Gait training, Patient/Family education, Joint mobilization, Stair training, DME instructions, Dry Needling, Electrical stimulation, Cryotherapy, vasopneumatic device, Moist heat, Taping, Traction Ultrasound, Ionotophoresis 4mg /ml Dexamethasone, and aquatic therapy, Manual therapy.  All included unless contraindicated  PLAN FOR NEXT SESSION: how was DN and repeat if desired.   Ivery Quale, PT,  DPT 04/08/23 3:55 PM

## 2023-04-10 ENCOUNTER — Encounter: Payer: Self-pay | Admitting: Physical Therapy

## 2023-04-10 ENCOUNTER — Ambulatory Visit (INDEPENDENT_AMBULATORY_CARE_PROVIDER_SITE_OTHER): Payer: BC Managed Care – PPO | Admitting: Physical Therapy

## 2023-04-10 DIAGNOSIS — M5459 Other low back pain: Secondary | ICD-10-CM

## 2023-04-10 DIAGNOSIS — R262 Difficulty in walking, not elsewhere classified: Secondary | ICD-10-CM

## 2023-04-10 DIAGNOSIS — M25552 Pain in left hip: Secondary | ICD-10-CM

## 2023-04-10 NOTE — Therapy (Signed)
OUTPATIENT PHYSICAL THERAPY TREATMENT   Patient Name: Brianna Gutierrez MRN: [id number] DOB:1975/12/18, 47 y.o., female Today's Date: 04/10/2023  END OF SESSION:  PT End of Session - 04/10/23 1441     Visit Number 3    Number of Visits 20    Date for PT Re-Evaluation 06/11/23    Authorization Type [employer] 10% coinsurance - 60 visit    Progress Note Due on Visit 10    PT Start Time 1440    PT Stop Time 1515    PT Time Calculation (min) 35 min    Activity Tolerance Patient limited by pain    Behavior During Therapy [facility] for tasks assessed/performed             Past Medical History:  Diagnosis Date   Abnormal Pap smear of cervix    Anxiety    Depression    Dysmenorrhea    Eczema    History of COVID-19 10/24/2020   loss of taste and smellbody aches and cough x 4 days all symptoms resolved   History of kidney stones    Hypertension    Pre-diabetes    PTSD (post-traumatic stress disorder)    Rash 08/31/2021   comes and goes on legs saw dermetology no rash now ? food allergy   SUI (stress urinary incontinence, female)    Trichomonas infection 02/2021   Uterine fibroid    Past Surgical History:  Procedure Laterality Date   BLADDER SUSPENSION N/A 09/11/2021   Procedure: TRANSVAGINAL TAPE (TVT) PROCEDURE;  Surgeon: Patton Salles, MD;  Location: [facility] [place];  Service: Gynecology;  Laterality: N/A;   CESAREAN SECTION  1997   x 1   CHOLECYSTECTOMY N/A 01/14/2019   Procedure: LAPAROSCOPIC CHOLECYSTECTOMY WITH POSSIBLE  INTRAOPERATIVE CHOLANGIOGRAM;  Surgeon: Darnell Level, MD;  Location: [facility];  Service: General;  Laterality: N/A;   CYSTOSCOPY N/A 09/11/2021   Procedure: Bluford Kaufmann;  Surgeon: Patton Salles, MD;  Location: [facility];  Service: Gynecology;  Laterality: N/A;   CYSTOSCOPY/URETEROSCOPY/HOLMIUM LASER Left 01/31/2022   Procedure: LEFT URETEROSCOPY/HOLMIUM LASER;  Surgeon: Despina Arias, MD;  Location: [facility];   Service: Urology;  Laterality: Left;   DILITATION & CURRETTAGE/HYSTROSCOPY WITH HYDROTHERMAL ABLATION N/A 06/22/2016   Procedure: DILATATION & CURETTAGE/HYSTEROSCOPY WITH HYDROTHERMAL ABLATION;  Surgeon: Brock Bad, MD;  Location: [facility];  Service: Gynecology;  Laterality: N/A;   TOTAL LAPAROSCOPIC HYSTERECTOMY WITH SALPINGECTOMY Bilateral 09/11/2021   Procedure: TOTAL LAPAROSCOPIC HYSTERECTOMY WITH SALPINGECTOMY;  Surgeon: Patton Salles, MD;  Location: [facility];  Service: Gynecology;  Laterality: Bilateral;   TUBAL LIGATION     09-28-2015   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   Smoking 10/26/2021   Postoperative intra-abdominal abscess    Medication monitoring encounter    Pelvic infection in female 09/24/2021   Status post laparoscopic hysterectomy 09/11/2021   Cholelithiasis 01/13/2019   Pre-diabetes 11/15/2016   History of bilateral tubal ligation 09/28/2015   Tobacco abuse 09/28/2015    PCP: [name] NP  REFERRING PROVIDER: Juanda Chance, NP  REFERRING DIAG: [phone] (ICD-10-CM) -  Chronic bilateral low back pain without sciatica M79.18 (ICD-10-CM) - Myofascial pain syndrome M47.819 (ICD-10-CM) - Spondylosis without myelopathy or radiculopathy  Rationale for Evaluation and Treatment: Rehabilitation  THERAPY DIAG:  Other low back pain  Difficulty in walking, not elsewhere classified  Pain in left hip  ONSET DATE: 01/2023 "a couple months"  SUBJECTIVE:                                                                                                                                                                                            SUBJECTIVE STATEMENT: She relays her back is feeling some better after DN, she is interested in having this again. She says the pain has moved to more localized in the center of her back now  PERTINENT HISTORY:  Anxiety, depression, HTN, PTSD,   PAIN:  NPRS scale: current 5-6/10 Pain location: back pain, sometimes into Lt hip posterior Pain description: grabbing, cramps Aggravating factors: bending, walking Relieving factors: medicine  PRECAUTIONS: None  WEIGHT BEARING RESTRICTIONS: No  FALLS:  Has patient fallen in last 6 months? No  LIVING ENVIRONMENT: Lives in: House/apartment  OCCUPATION: Works at [employer]  PLOF: Independent, read, watch TV  PATIENT GOALS: Reduce pain, improve work  OBJECTIVE:   PATIENT SURVEYS:  04/02/2023 FOTO eval:  44  predicted:  63  SCREENING FOR RED FLAGS: 04/02/2023 Bowel or bladder incontinence: No Cauda equina syndrome: No  COGNITION: 04/02/2023 Overall cognitive status: WFL normal      SENSATION: 04/02/2023 WFL  MUSCLE LENGTH: 04/02/2023 Passive SLR Rt 90 deg, Lt 60 deg c pain  POSTURE:  04/02/2023 Mild increase in lumbar lordosis in standing. No observed lateral shift.   PALPATION: 04/02/2023 Tenderness to light touch Lt lumbar, posterior/lateral Lt hip  LUMBAR ROM:   04/02/2023 : no centralization/peripheralization noted c lumbar movement.   AROM 04/02/2023  Flexion To patella with pain in end range, gower's sign upon return.   Extension 25% WFL c ERP lumbar   Repeated x 5 in standing  similar complaints in end range, reduced complaints in standing  Right lateral flexion   Left lateral flexion   Right rotation   Left rotation    (Blank rows = not tested)  LOWER EXTREMITY ROM:      Right 04/02/2023 Left 04/02/2023  Hip flexion    Hip extension    Hip abduction    Hip adduction    Hip internal rotation    Hip  external rotation    Knee flexion    Knee extension    Ankle dorsiflexion    Ankle plantarflexion    Ankle inversion  Ankle eversion     (Blank rows = not tested)  LOWER EXTREMITY MMT:    MMT Right 04/02/2023 Left 04/02/2023  Hip flexion 5/5 4/5 c pain  Hip extension    Hip abduction    Hip adduction    Hip internal rotation    Hip external rotation    Knee flexion 5/5 5/5  Knee extension 5/5 5/5  Ankle dorsiflexion 5/5 5/5  Ankle plantarflexion    Ankle inversion    Ankle eversion     (Blank rows = not tested)  LUMBAR SPECIAL TESTS:  04/02/2023 (-) slump bilateral, (-) crossed slr bilateral)  FUNCTIONAL TESTS:  04/02/2023 18 inch chair transfer s UE assist on 1st try without pain increase.   GAIT: 04/02/2023 Independence   TODAY'S TREATMENT:                                                                                    DATE: 04/08/2023  Manual therapy for active compression and skilled palpation and Trigger Point Dry-Needling  Treatment instructions: Expect mild to moderate muscle soreness. Patient Consent Given: Yes Education handout provided: verbally provided Muscles treated: bilat lumbar paraspinals and multifidi,   Treatment response/outcome: good overall tolerance,twitch response noted  Supine Low trunk rotations 10 xec X 5 (cues not to stretch into the pain but to be gentle with these) Supine single knee to chest stretch 20 sec X 3 bilat Supine figure 4 piriformis stretch with contralateral leg on table 30 sec X 3 Supine figure 4 stretch knee to opposite shoulder 30 sec X 3 Supine pelvic tilts X 15 Supine bridges X 15 Standing shoulder rows green 2X10 Standing shoulder extensions green 2X10 Nu step L6 X 8 min UE/LE seat #5 Standing lumbar extension 5 sec X 10  DATE: 04/02/2023  Therex:    HEP instruction/performance c cues for techniques, handout provided.  Trial set performed of each for comprehension and symptom assessment.  See below for exercise  list  PATIENT EDUCATION:  04/02/2023 Education details: HEP, POC Person educated: Patient Education method: Explanation, Demonstration, Verbal cues, and Handouts Education comprehension: verbalized understanding, returned demonstration, and verbal cues required  HOME EXERCISE PROGRAM: Access Code: [email or web address] URL: [email or web address] Date: 04/02/2023 Prepared by: Chyrel Masson  Exercises - Standing Lumbar Extension with Counter  - 3-5 x daily - 7 x weekly - 1 sets - 5-10 reps - Supine Lower Trunk Rotation  - 2-3 x daily - 7 x weekly - 1 sets - 3-5 reps - 15 hold - Supine Piriformis Stretch with Foot on Ground (Mirrored)  - 2 x daily - 7 x weekly - 1 sets - 5 reps - 15-30 hold - Supine Figure 4 Piriformis Stretch (Mirrored)  - 2 x daily - 7 x weekly - 1 sets - 5 reps - 15-30 hold  ASSESSMENT:  CLINICAL IMPRESSION: She does relay some improvement after last session so we did continue with DN intervention. Her pain is more localized to the center of her low back around L4-5 so this is hopefully a sign of centralization of symptoms and we will continue with current plan.   OBJECTIVE IMPAIRMENTS: decreased activity tolerance, decreased coordination, decreased endurance, decreased  mobility, difficulty walking, decreased ROM, decreased strength, hypomobility, increased fascial restrictions, impaired perceived functional ability, increased muscle spasms, impaired flexibility, improper body mechanics, and pain.   ACTIVITY LIMITATIONS: carrying, lifting, bending, sitting, standing, squatting, sleeping, stairs, transfers, bed mobility, reach over head, and locomotion level  PARTICIPATION LIMITATIONS: meal prep, cleaning, laundry, interpersonal relationship, driving, shopping, community activity, and occupation  PERSONAL FACTORS:  Anxiety, depression, HTN, PTSD,   are also affecting patient's functional outcome.   REHAB POTENTIAL: Good  CLINICAL DECISION MAKING:  Stable/uncomplicated  EVALUATION COMPLEXITY: Low   GOALS: Goals reviewed with patient? Yes  SHORT TERM GOALS: (target date for Short term goals are 3 weeks 04/23/2023)  1. Patient will demonstrate independent use of home exercise program to maintain progress from in clinic treatments.  Goal status: New  LONG TERM GOALS: (target dates for all long term goals are 10 weeks  06/11/2023 )   1. Patient will demonstrate/report pain at worst less than or equal to 2/10 to facilitate minimal limitation in daily activity secondary to pain symptoms.  Goal status: New   2. Patient will demonstrate independent use of home exercise program to facilitate ability to maintain/progress functional gains from skilled physical therapy services.  Goal status: New   3. Patient will demonstrate FOTO outcome > or = 63 % to indicate reduced disability due to condition.  Goal status: New   4. Patient will demonstrate lumbar extension 100 % WFL s symptoms to facilitate upright standing, walking posture at PLOF s limitation.  Goal status: New   5.  Patient will demonstrate bilateral LE MMT 5/5 throughout to facilitate usual lifting, carrying for home and work activity.   Goal status: New   6.  Patient will demonstrate/report ability to sleep s restriction due to symptoms.  Goal status: New   7.  Patient will demonstrate ability to stand/walk s restriction due to symptoms for exercise and work.  Goal Status: New  PLAN:  PT FREQUENCY: 1-2x/week  PT DURATION: 10 weeks  PLANNED INTERVENTIONS: Therapeutic exercises, Therapeutic activity, Neuro Muscular re-education, Balance training, Gait training, Patient/Family education, Joint mobilization, Stair training, DME instructions, Dry Needling, Electrical stimulation, Cryotherapy, vasopneumatic device, Moist heat, Taping, Traction Ultrasound, Ionotophoresis 4mg /ml Dexamethasone, and aquatic therapy, Manual therapy.  All included unless contraindicated  PLAN  FOR NEXT SESSION: how was DN and repeat if desired. Continue with lumbar mobility and core stability program with progressions as tolerated.   Ivery Quale, PT, DPT 04/10/23 2:41 PM

## 2023-04-15 ENCOUNTER — Encounter: Payer: Self-pay | Admitting: Physical Therapy

## 2023-04-15 ENCOUNTER — Ambulatory Visit (INDEPENDENT_AMBULATORY_CARE_PROVIDER_SITE_OTHER): Payer: BC Managed Care – PPO | Admitting: Physical Therapy

## 2023-04-15 DIAGNOSIS — M5459 Other low back pain: Secondary | ICD-10-CM

## 2023-04-15 DIAGNOSIS — R262 Difficulty in walking, not elsewhere classified: Secondary | ICD-10-CM

## 2023-04-15 DIAGNOSIS — M25552 Pain in left hip: Secondary | ICD-10-CM | POA: Diagnosis not present

## 2023-04-15 NOTE — Therapy (Signed)
OUTPATIENT PHYSICAL THERAPY TREATMENT   Patient Name: Brianna Gutierrez MRN: [id number] DOB:1975/12/03, 47 y.o., female Today's Date: 04/15/2023  END OF SESSION:  PT End of Session - 04/15/23 1618     Visit Number 4    Number of Visits 20    Date for PT Re-Evaluation 06/11/23    Authorization Type [employer] 10% coinsurance - 60 visit    Progress Note Due on Visit 10    PT Start Time 1607    PT Stop Time 1645    PT Time Calculation (min) 38 min    Activity Tolerance Patient tolerated treatment well    Behavior During Therapy WFL for tasks assessed/performed             Past Medical History:  Diagnosis Date   Abnormal Pap smear of cervix    Anxiety    Depression    Dysmenorrhea    Eczema    History of COVID-19 10/24/2020   loss of taste and smellbody aches and cough x 4 days all symptoms resolved   History of kidney stones    Hypertension    Pre-diabetes    PTSD (post-traumatic stress disorder)    Rash 08/31/2021   comes and goes on legs saw dermetology no rash now ? food allergy   SUI (stress urinary incontinence, female)    Trichomonas infection 02/2021   Uterine fibroid    Past Surgical History:  Procedure Laterality Date   BLADDER SUSPENSION N/A 09/11/2021   Procedure: TRANSVAGINAL TAPE (TVT) PROCEDURE;  Surgeon: Patton Salles, MD;  Location: [facility];  Service: Gynecology;  Laterality: N/A;   CESAREAN SECTION  1997   x 1   CHOLECYSTECTOMY N/A 01/14/2019   Procedure: LAPAROSCOPIC CHOLECYSTECTOMY WITH POSSIBLE  INTRAOPERATIVE CHOLANGIOGRAM;  Surgeon: Darnell Level, MD;  Location: [facility];  Service: General;  Laterality: N/A;   CYSTOSCOPY N/A 09/11/2021   Procedure: Bluford Kaufmann;  Surgeon: Patton Salles, MD;  Location: [facility];  Service: Gynecology;  Laterality: N/A;   CYSTOSCOPY/URETEROSCOPY/HOLMIUM LASER Left 01/31/2022   Procedure: LEFT URETEROSCOPY/HOLMIUM LASER;  Surgeon: Despina Arias, MD;   Location: [facility];  Service: Urology;  Laterality: Left;   DILITATION & CURRETTAGE/HYSTROSCOPY WITH HYDROTHERMAL ABLATION N/A 06/22/2016   Procedure: DILATATION & CURETTAGE/HYSTEROSCOPY WITH HYDROTHERMAL ABLATION;  Surgeon: Brock Bad, MD;  Location: [facility];  Service: Gynecology;  Laterality: N/A;   TOTAL LAPAROSCOPIC HYSTERECTOMY WITH SALPINGECTOMY Bilateral 09/11/2021   Procedure: TOTAL LAPAROSCOPIC HYSTERECTOMY WITH SALPINGECTOMY;  Surgeon: Patton Salles, MD;  Location: [facility];  Service: Gynecology;  Laterality: Bilateral;   TUBAL LIGATION     09-28-2015   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   Smoking 10/26/2021   Postoperative intra-abdominal abscess    Medication monitoring encounter    Pelvic infection in female 09/24/2021   Status post laparoscopic hysterectomy 09/11/2021   Cholelithiasis 01/13/2019   Pre-diabetes 11/15/2016   History of bilateral tubal ligation 09/28/2015   Tobacco abuse 09/28/2015    PCP: [name] NP  REFERRING PROVIDER: Juanda Chance, NP  REFERRING DIAG: [phone] (ICD-10-CM) -  Chronic bilateral low back pain without sciatica M79.18 (ICD-10-CM) - Myofascial pain syndrome M47.819 (ICD-10-CM) - Spondylosis without myelopathy or radiculopathy  Rationale for Evaluation and Treatment: Rehabilitation  THERAPY DIAG:  Other low back pain  Difficulty in walking, not elsewhere classified  Pain in left hip  ONSET DATE: 01/2023 "a couple months"  SUBJECTIVE:                                                                                                                                                                                            SUBJECTIVE STATEMENT: She relays she gets a cramp in her left low back that bothers her when she bends over or when she goes to the bathroom and tries to stand back up  PERTINENT HISTORY:  Anxiety, depression, HTN, PTSD,   PAIN:  NPRS scale: current 5-6/10 Pain location: back pain, sometimes into Lt hip posterior Pain description: grabbing, cramps Aggravating factors: bending, walking Relieving factors: medicine  PRECAUTIONS: None  WEIGHT BEARING RESTRICTIONS: No  FALLS:  Has patient fallen in last 6 months? No  LIVING ENVIRONMENT: Lives in: House/apartment  OCCUPATION: Works at [employer]  PLOF: Independent, read, watch TV  PATIENT GOALS: Reduce pain, improve work  OBJECTIVE:   PATIENT SURVEYS:  04/02/2023 FOTO eval:  44  predicted:  63  SCREENING FOR RED FLAGS: 04/02/2023 Bowel or bladder incontinence: No Cauda equina syndrome: No  COGNITION: 04/02/2023 Overall cognitive status: WFL normal      SENSATION: 04/02/2023 WFL  MUSCLE LENGTH: 04/02/2023 Passive SLR Rt 90 deg, Lt 60 deg c pain  POSTURE:  04/02/2023 Mild increase in lumbar lordosis in standing. No observed lateral shift.   PALPATION: 04/02/2023 Tenderness to light touch Lt lumbar, posterior/lateral Lt hip  LUMBAR ROM:   04/02/2023 : no centralization/peripheralization noted c lumbar movement.   AROM 04/02/2023  Flexion To patella with pain in end range, gower's sign upon return.   Extension 25% WFL c ERP lumbar   Repeated x 5 in standing  similar complaints in end range, reduced complaints in standing  Right lateral flexion   Left lateral flexion   Right rotation   Left rotation    (Blank rows = not tested)  LOWER EXTREMITY ROM:      Right 04/02/2023 Left 04/02/2023  Hip flexion    Hip extension    Hip abduction    Hip adduction    Hip internal rotation    Hip external  rotation    Knee flexion    Knee extension    Ankle dorsiflexion    Ankle plantarflexion    Ankle inversion    Ankle eversion     (  Blank rows = not tested)  LOWER EXTREMITY MMT:    MMT Right 04/02/2023 Left 04/02/2023  Hip flexion 5/5 4/5 c pain  Hip extension    Hip abduction    Hip adduction    Hip internal rotation    Hip external rotation    Knee flexion 5/5 5/5  Knee extension 5/5 5/5  Ankle dorsiflexion 5/5 5/5  Ankle plantarflexion    Ankle inversion    Ankle eversion     (Blank rows = not tested)  LUMBAR SPECIAL TESTS:  04/02/2023 (-) slump bilateral, (-) crossed slr bilateral)  FUNCTIONAL TESTS:  04/02/2023 18 inch chair transfer s UE assist on 1st try without pain increase.   GAIT: 04/02/2023 Independence   TODAY'S TREATMENT:                                                                                    DATE: 04/15/2023  Manual therapy for active compression and skilled palpation and Trigger Point Dry-Needling  Treatment instructions: Expect mild to moderate muscle soreness. Patient Consent Given: Yes Education handout provided: verbally provided Muscles treated: Left lumbar paraspinals and multifidi, QL Treatment response/outcome: good overall tolerance,twitch response noted  Supine Low trunk rotations 10 xec X 5  Supine figure 4 piriformis stretch with contralateral leg on table 30 sec X 2 Supine figure 4 stretch knee to opposite shoulder 30 sec X 2 Supine bridges X 15 Standing unilateral shoulder rows green 2X10 each side Standing unilateral shoulder extensions green 2X10 Standing unilateral chest press green 2X10 Nu step L6 X 8 min UE/LE seat #5 Standing hip extensions X 10 bilat with red Standing hip abd X 10 bilat with red Standing lumbar extension 5 sec X 10  DATE: 04/08/2023  Manual therapy for active compression and skilled palpation and Trigger Point Dry-Needling  Treatment instructions: Expect mild to moderate muscle soreness. Patient  Consent Given: Yes Education handout provided: verbally provided Muscles treated: bilat lumbar paraspinals and multifidi,   Treatment response/outcome: good overall tolerance,twitch response noted  Supine Low trunk rotations 10 xec X 5 (cues not to stretch into the pain but to be gentle with these) Supine single knee to chest stretch 20 sec X 3 bilat Supine figure 4 piriformis stretch with contralateral leg on table 30 sec X 3 Supine figure 4 stretch knee to opposite shoulder 30 sec X 3 Supine pelvic tilts X 15 Supine bridges X 15 Standing shoulder rows green 2X10 Standing shoulder extensions green 2X10 Nu step L6 X 8 min UE/LE seat #5 Standing lumbar extension 5 sec X 10  DATE: 04/02/2023  Therex:    HEP instruction/performance c cues for techniques, handout provided.  Trial set performed of each for comprehension and symptom assessment.  See below for exercise list  PATIENT EDUCATION:  04/02/2023 Education details: HEP, POC Person educated: Patient Education method: Explanation, Demonstration, Verbal cues, and Handouts Education comprehension: verbalized understanding, returned demonstration, and verbal cues required  HOME EXERCISE PROGRAM: Access Code: [email or web address] URL: [email or web address] Date: 04/02/2023 Prepared by: Chyrel Masson  Exercises - Standing Lumbar Extension with Counter  - 3-5 x daily - 7 x weekly - 1 sets - 5-10 reps - Supine Lower  Trunk Rotation  - 2-3 x daily - 7 x weekly - 1 sets - 3-5 reps - 15 hold - Supine Piriformis Stretch with Foot on Ground (Mirrored)  - 2 x daily - 7 x weekly - 1 sets - 5 reps - 15-30 hold - Supine Figure 4 Piriformis Stretch (Mirrored)  - 2 x daily - 7 x weekly - 1 sets - 5 reps - 15-30 hold  ASSESSMENT:  CLINICAL IMPRESSION: She is still having what she describes as cramping pain in her left low back. We continued with DN treatment and added in more core and hip strength with continued lumbar stretching program within  her tolerance.    OBJECTIVE IMPAIRMENTS: decreased activity tolerance, decreased coordination, decreased endurance, decreased mobility, difficulty walking, decreased ROM, decreased strength, hypomobility, increased fascial restrictions, impaired perceived functional ability, increased muscle spasms, impaired flexibility, improper body mechanics, and pain.   ACTIVITY LIMITATIONS: carrying, lifting, bending, sitting, standing, squatting, sleeping, stairs, transfers, bed mobility, reach over head, and locomotion level  PARTICIPATION LIMITATIONS: meal prep, cleaning, laundry, interpersonal relationship, driving, shopping, community activity, and occupation  PERSONAL FACTORS:  Anxiety, depression, HTN, PTSD,   are also affecting patient's functional outcome.   REHAB POTENTIAL: Good  CLINICAL DECISION MAKING: Stable/uncomplicated  EVALUATION COMPLEXITY: Low   GOALS: Goals reviewed with patient? Yes  SHORT TERM GOALS: (target date for Short term goals are 3 weeks 04/23/2023)  1. Patient will demonstrate independent use of home exercise program to maintain progress from in clinic treatments.  Goal status: New  LONG TERM GOALS: (target dates for all long term goals are 10 weeks  06/11/2023 )   1. Patient will demonstrate/report pain at worst less than or equal to 2/10 to facilitate minimal limitation in daily activity secondary to pain symptoms.  Goal status: New   2. Patient will demonstrate independent use of home exercise program to facilitate ability to maintain/progress functional gains from skilled physical therapy services.  Goal status: New   3. Patient will demonstrate FOTO outcome > or = 63 % to indicate reduced disability due to condition.  Goal status: New   4. Patient will demonstrate lumbar extension 100 % WFL s symptoms to facilitate upright standing, walking posture at PLOF s limitation.  Goal status: New   5.  Patient will demonstrate bilateral LE MMT 5/5 throughout  to facilitate usual lifting, carrying for home and work activity.   Goal status: New   6.  Patient will demonstrate/report ability to sleep s restriction due to symptoms.  Goal status: New   7.  Patient will demonstrate ability to stand/walk s restriction due to symptoms for exercise and work.  Goal Status: New  PLAN:  PT FREQUENCY: 1-2x/week  PT DURATION: 10 weeks  PLANNED INTERVENTIONS: Therapeutic exercises, Therapeutic activity, Neuro Muscular re-education, Balance training, Gait training, Patient/Family education, Joint mobilization, Stair training, DME instructions, Dry Needling, Electrical stimulation, Cryotherapy, vasopneumatic device, Moist heat, Taping, Traction Ultrasound, Ionotophoresis 4mg /ml Dexamethasone, and aquatic therapy, Manual therapy.  All included unless contraindicated  PLAN FOR NEXT SESSION: how was DN and repeat if desired. Continue with lumbar extension focus,lumbar mobility and core stability program with progressions as tolerated.   Ivery Quale, PT, DPT 04/15/23 4:20 PM

## 2023-04-17 ENCOUNTER — Encounter: Payer: Self-pay | Admitting: Rehabilitative and Restorative Service Providers"

## 2023-04-17 ENCOUNTER — Ambulatory Visit (INDEPENDENT_AMBULATORY_CARE_PROVIDER_SITE_OTHER): Payer: BC Managed Care – PPO | Admitting: Rehabilitative and Restorative Service Providers"

## 2023-04-17 DIAGNOSIS — M5459 Other low back pain: Secondary | ICD-10-CM | POA: Diagnosis not present

## 2023-04-17 DIAGNOSIS — M25552 Pain in left hip: Secondary | ICD-10-CM | POA: Diagnosis not present

## 2023-04-17 DIAGNOSIS — R262 Difficulty in walking, not elsewhere classified: Secondary | ICD-10-CM | POA: Diagnosis not present

## 2023-04-17 NOTE — Therapy (Signed)
OUTPATIENT PHYSICAL THERAPY TREATMENT   Patient Name: Brianna Gutierrez MRN: [id number] DOB:Jun 29, 1976, 47 y.o., female Today's Date: 04/17/2023  END OF SESSION:  PT End of Session - 04/17/23 1503     Visit Number 5    Number of Visits 20    Date for PT Re-Evaluation 06/11/23    Authorization Type [employer] 10% coinsurance - 60 visit    Progress Note Due on Visit 10    PT Start Time 1515    PT Stop Time 1554    PT Time Calculation (min) 39 min    Activity Tolerance Patient limited by pain    Behavior During Therapy [facility] for tasks assessed/performed              Past Medical History:  Diagnosis Date   Abnormal Pap smear of cervix    Anxiety    Depression    Dysmenorrhea    Eczema    History of COVID-19 10/24/2020   loss of taste and smellbody aches and cough x 4 days all symptoms resolved   History of kidney stones    Hypertension    Pre-diabetes    PTSD (post-traumatic stress disorder)    Rash 08/31/2021   comes and goes on legs saw dermetology no rash now ? food allergy   SUI (stress urinary incontinence, female)    Trichomonas infection 02/2021   Uterine fibroid    Past Surgical History:  Procedure Laterality Date   BLADDER SUSPENSION N/A 09/11/2021   Procedure: TRANSVAGINAL TAPE (TVT) PROCEDURE;  Surgeon: Patton Salles, MD;  Location: [facility];  Service: Gynecology;  Laterality: N/A;   CESAREAN SECTION  1997   x 1   CHOLECYSTECTOMY N/A 01/14/2019   Procedure: LAPAROSCOPIC CHOLECYSTECTOMY WITH POSSIBLE  INTRAOPERATIVE CHOLANGIOGRAM;  Surgeon: Darnell Level, MD;  Location: [facility];  Service: General;  Laterality: N/A;   CYSTOSCOPY N/A 09/11/2021   Procedure: Bluford Kaufmann;  Surgeon: Patton Salles, MD;  Location: [facility];  Service: Gynecology;  Laterality: N/A;   CYSTOSCOPY/URETEROSCOPY/HOLMIUM LASER Left 01/31/2022   Procedure: LEFT URETEROSCOPY/HOLMIUM LASER;  Surgeon: Despina Arias, MD;  Location: [facility];  Service: Urology;  Laterality: Left;   DILITATION & CURRETTAGE/HYSTROSCOPY WITH HYDROTHERMAL ABLATION N/A 06/22/2016   Procedure: DILATATION & CURETTAGE/HYSTEROSCOPY WITH HYDROTHERMAL ABLATION;  Surgeon: Brock Bad, MD;  Location: [facility];  Service: Gynecology;  Laterality: N/A;   TOTAL LAPAROSCOPIC HYSTERECTOMY WITH SALPINGECTOMY Bilateral 09/11/2021   Procedure: TOTAL LAPAROSCOPIC HYSTERECTOMY WITH SALPINGECTOMY;  Surgeon: Patton Salles, MD;  Location: [facility];  Service: Gynecology;  Laterality: Bilateral;   TUBAL LIGATION     09-28-2015   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   Smoking 10/26/2021   Postoperative intra-abdominal abscess    Medication monitoring encounter    Pelvic infection in female 09/24/2021   Status post laparoscopic hysterectomy 09/11/2021   Cholelithiasis 01/13/2019   Pre-diabetes 11/15/2016   History of bilateral tubal ligation 09/28/2015   Tobacco abuse 09/28/2015    PCP: [name] NP  REFERRING PROVIDER: Juanda Chance, NP  REFERRING DIAG: [phone] (  ICD-10-CM) - Chronic bilateral low back pain without sciatica M79.18 (ICD-10-CM) - Myofascial pain syndrome M47.819 (ICD-10-CM) - Spondylosis without myelopathy or radiculopathy  Rationale for Evaluation and Treatment: Rehabilitation  THERAPY DIAG:  Other low back pain  Difficulty in walking, not elsewhere classified  Pain in left hip  ONSET DATE: 01/2023 "a couple months"  SUBJECTIVE:                                                                                                                                                                                            SUBJECTIVE STATEMENT: She indicated feeling bending down and cramping on Lt side.   Reported that symptom has stayed the same. Middle of the back has gotten improvement.   PERTINENT HISTORY:  Anxiety, depression, HTN, PTSD,   PAIN:  NPRS scale: 5/10  Pain location: Lt lower back Pain description: grabbing, cramps Aggravating factors: bending  Relieving factors: medicine  PRECAUTIONS: None  WEIGHT BEARING RESTRICTIONS: No  FALLS:  Has patient fallen in last 6 months? No  LIVING ENVIRONMENT: Lives in: House/apartment  OCCUPATION: Works at [employer]  PLOF: Independent, read, watch TV  PATIENT GOALS: Reduce pain, improve work  OBJECTIVE:   PATIENT SURVEYS:  04/02/2023 FOTO eval:  44  predicted:  63  SCREENING FOR RED FLAGS: 04/02/2023 Bowel or bladder incontinence: No Cauda equina syndrome: No  COGNITION: 04/02/2023 Overall cognitive status: WFL normal      SENSATION: 04/02/2023 WFL  MUSCLE LENGTH: 04/02/2023 Passive SLR Rt 90 deg, Lt 60 deg c pain  POSTURE:  04/02/2023 Mild increase in lumbar lordosis in standing. No observed lateral shift.   PALPATION: 04/02/2023 Tenderness to light touch Lt lumbar, posterior/lateral Lt hip  LUMBAR ROM:   04/02/2023 : no centralization/peripheralization noted c lumbar movement.   AROM 04/02/2023 04/17/2023:    Flexion To patella with pain in end range, gower's sign upon return.    Extension 25% WFL c ERP lumbar   Repeated x 5 in standing  similar complaints in end range, reduced complaints in standing 50 % WFL with end range Lt lumbar pain  Repeated x 5 in standing:  no change in symptoms   Right lateral flexion    Left lateral flexion    Right rotation    Left rotation     (Blank rows = not tested)  LOWER EXTREMITY ROM:      Right 04/02/2023 Left 04/02/2023  Hip flexion    Hip extension    Hip abduction    Hip  adduction    Hip internal rotation    Hip external rotation    Knee flexion  Knee extension    Ankle dorsiflexion    Ankle plantarflexion    Ankle inversion    Ankle eversion     (Blank rows = not tested)  LOWER EXTREMITY MMT:    MMT Right 04/02/2023 Left 04/02/2023  Hip flexion 5/5 4/5 c pain  Hip extension    Hip abduction    Hip adduction    Hip internal rotation    Hip external rotation    Knee flexion 5/5 5/5  Knee extension 5/5 5/5  Ankle dorsiflexion 5/5 5/5  Ankle plantarflexion    Ankle inversion    Ankle eversion     (Blank rows = not tested)  LUMBAR SPECIAL TESTS:  04/02/2023 (-) slump bilateral, (-) crossed slr bilateral)  FUNCTIONAL TESTS:  04/02/2023 18 inch chair transfer s UE assist on 1st try without pain increase.   GAIT: 04/02/2023 [place]                    TODAY'S TREATMENT:                                                                                   DATE: 04/17/2023  Manual therapy Compression to Lt QL, skilled palpation to same with dry needling  Trigger Point Dry-Needling  Treatment instructions: Expect mild to moderate muscle soreness. Patient Consent Given: Yes Education handout provided: verbally provided Muscles treated: Left lumbar paraspinals and multifidi, QL Treatment response/outcome: good overall tolerance,twitch response noted   Moist Heat 5 mins post needling with HEP review, stretching below.   Therex: Standing lumbar extension x 5 Rt sidelying regional rotation stretch 15 sec x 5 with moist heat Supine SKC 15 sec x 5 bilateral Supine marching 2 x 10 bilateral Supine pball press down 5 sec hold x 10  Nustep Lvl 5 8 mins UE/LE   TODAY'S TREATMENT:                                                                                   DATE: 04/15/2023  Manual therapy for active compression and skilled palpation and Trigger Point Dry-Needling  Treatment instructions: Expect mild to moderate muscle soreness. Patient  Consent Given: Yes Education handout provided: verbally provided Muscles treated: Left lumbar paraspinals and multifidi, QL Treatment response/outcome: good overall tolerance,twitch response noted  Supine Low trunk rotations 10 xec X 5  Supine figure 4 piriformis stretch with contralateral leg on table 30 sec X 2 Supine figure 4 stretch knee to opposite shoulder 30 sec X 2 Supine bridges X 15 Standing unilateral shoulder rows green 2X10 each side Standing unilateral shoulder extensions green 2X10 Standing unilateral chest press green 2X10 Nu step L6 X 8 min UE/LE seat #5 Standing hip extensions X 10 bilat with red Standing hip abd X 10 bilat with red Standing lumbar extension 5 sec X 10  TODAY'S TREATMENT:  DATE:04/08/2023  Manual therapy for active compression and skilled palpation and Trigger Point Dry-Needling  Treatment instructions: Expect mild to moderate muscle soreness. Patient Consent Given: Yes Education handout provided: verbally provided Muscles treated: bilat lumbar paraspinals and multifidi,   Treatment response/outcome: good overall tolerance,twitch response noted  Supine Low trunk rotations 10 xec X 5 (cues not to stretch into the pain but to be gentle with these) Supine single knee to chest stretch 20 sec X 3 bilat Supine figure 4 piriformis stretch with contralateral leg on table 30 sec X 3 Supine figure 4 stretch knee to opposite shoulder 30 sec X 3 Supine pelvic tilts X 15 Supine bridges X 15 Standing shoulder rows green 2X10 Standing shoulder extensions green 2X10 Nu step L6 X 8 min UE/LE seat #5 Standing lumbar extension 5 sec X 10    PATIENT EDUCATION:  04/02/2023 Education details: HEP, POC Person educated: Patient Education method: Programmer, multimedia, Demonstration, Verbal cues, and Handouts Education comprehension: verbalized understanding, returned demonstration, and verbal cues  required  HOME EXERCISE PROGRAM: Access Code: [email or web address] URL: [email or web address] Date: 04/02/2023 Prepared by: Chyrel Masson  Exercises - Standing Lumbar Extension with Counter  - 3-5 x daily - 7 x weekly - 1 sets - 5-10 reps - Supine Lower Trunk Rotation  - 2-3 x daily - 7 x weekly - 1 sets - 3-5 reps - 15 hold - Supine Piriformis Stretch with Foot on Ground (Mirrored)  - 2 x daily - 7 x weekly - 1 sets - 5 reps - 15-30 hold - Supine Figure 4 Piriformis Stretch (Mirrored)  - 2 x daily - 7 x weekly - 1 sets - 5 reps - 15-30 hold  ASSESSMENT:  CLINICAL IMPRESSION: Strong concordant symptoms from lateral approach to Lt QL.  More focal symptoms overall indicated in daily symptoms with specific aggravating factor of bending noted.  Continued skilled PT services warranted at this time to address complaints.   OBJECTIVE IMPAIRMENTS: decreased activity tolerance, decreased coordination, decreased endurance, decreased mobility, difficulty walking, decreased ROM, decreased strength, hypomobility, increased fascial restrictions, impaired perceived functional ability, increased muscle spasms, impaired flexibility, improper body mechanics, and pain.   ACTIVITY LIMITATIONS: carrying, lifting, bending, sitting, standing, squatting, sleeping, stairs, transfers, bed mobility, reach over head, and locomotion level  PARTICIPATION LIMITATIONS: meal prep, cleaning, laundry, interpersonal relationship, driving, shopping, community activity, and occupation  PERSONAL FACTORS:  Anxiety, depression, HTN, PTSD,   are also affecting patient's functional outcome.   REHAB POTENTIAL: Good  CLINICAL DECISION MAKING: Stable/uncomplicated  EVALUATION COMPLEXITY: Low   GOALS: Goals reviewed with patient? Yes  SHORT TERM GOALS: (target date for Short term goals are 3 weeks 04/23/2023)  1. Patient will demonstrate independent use of home exercise program to maintain progress from in clinic  treatments.  Goal status: Met   LONG TERM GOALS: (target dates for all long term goals are 10 weeks  06/11/2023 )   1. Patient will demonstrate/report pain at worst less than or equal to 2/10 to facilitate minimal limitation in daily activity secondary to pain symptoms.  Goal status: on going 04/17/2023   2. Patient will demonstrate independent use of home exercise program to facilitate ability to maintain/progress functional gains from skilled physical therapy services.  Goal status: on going 04/17/2023   3. Patient will demonstrate FOTO outcome > or = 63 % to indicate reduced disability due to condition.  Goal status: on going 04/17/2023   4. Patient will demonstrate lumbar extension 100 % WFL s symptoms to facilitate upright  standing, walking posture at PLOF s limitation.  Goal status: on going 04/17/2023   5.  Patient will demonstrate bilateral LE MMT 5/5 throughout to facilitate usual lifting, carrying for home and work activity.   Goal status: on going 04/17/2023   6.  Patient will demonstrate/report ability to sleep s restriction due to symptoms.  Goal status: on going 04/17/2023   7.  Patient will demonstrate ability to stand/walk s restriction due to symptoms for exercise and work.  Goal Status: on going 04/17/2023  PLAN:  PT FREQUENCY: 1-2x/week  PT DURATION: 10 weeks  PLANNED INTERVENTIONS: Therapeutic exercises, Therapeutic activity, Neuro Muscular re-education, Balance training, Gait training, Patient/Family education, Joint mobilization, Stair training, DME instructions, Dry Needling, Electrical stimulation, Cryotherapy, vasopneumatic device, Moist heat, Taping, Traction Ultrasound, Ionotophoresis 4mg /ml Dexamethasone, and aquatic therapy, Manual therapy.  All included unless contraindicated  PLAN FOR NEXT SESSION: DN as desired.  Core activation (isometric holds, planks possible for strengthening in movements.    Chyrel Masson, PT, DPT, OCS, ATC 04/17/23  4:06  PM

## 2023-04-22 ENCOUNTER — Ambulatory Visit (INDEPENDENT_AMBULATORY_CARE_PROVIDER_SITE_OTHER): Payer: BC Managed Care – PPO | Admitting: Rehabilitative and Restorative Service Providers"

## 2023-04-22 ENCOUNTER — Encounter: Payer: Self-pay | Admitting: Rehabilitative and Restorative Service Providers"

## 2023-04-22 DIAGNOSIS — M25552 Pain in left hip: Secondary | ICD-10-CM | POA: Diagnosis not present

## 2023-04-22 DIAGNOSIS — M5459 Other low back pain: Secondary | ICD-10-CM

## 2023-04-22 DIAGNOSIS — R262 Difficulty in walking, not elsewhere classified: Secondary | ICD-10-CM | POA: Diagnosis not present

## 2023-04-22 NOTE — Therapy (Signed)
OUTPATIENT PHYSICAL THERAPY TREATMENT   Patient Name: Brianna Gutierrez MRN: [id number] DOB:09-12-1976, 47 y.o., female Today's Date: 04/22/2023  END OF SESSION:  PT End of Session - 04/22/23 1533     Visit Number 6    Number of Visits 20    Date for PT Re-Evaluation 06/11/23    Authorization Type [employer] 10% coinsurance - 60 visit    Progress Note Due on Visit 10    PT Start Time 1519    PT Stop Time 1558    PT Time Calculation (min) 39 min    Activity Tolerance Patient limited by pain    Behavior During Therapy [facility] for tasks assessed/performed               Past Medical History:  Diagnosis Date   Abnormal Pap smear of cervix    Anxiety    Depression    Dysmenorrhea    Eczema    History of COVID-19 10/24/2020   loss of taste and smellbody aches and cough x 4 days all symptoms resolved   History of kidney stones    Hypertension    Pre-diabetes    PTSD (post-traumatic stress disorder)    Rash 08/31/2021   comes and goes on legs saw dermetology no rash now ? food allergy   SUI (stress urinary incontinence, female)    Trichomonas infection 02/2021   Uterine fibroid    Past Surgical History:  Procedure Laterality Date   BLADDER SUSPENSION N/A 09/11/2021   Procedure: TRANSVAGINAL TAPE (TVT) PROCEDURE;  Surgeon: Patton Salles, MD;  Location: [facility];  Service: Gynecology;  Laterality: N/A;   CESAREAN SECTION  1997   x 1   CHOLECYSTECTOMY N/A 01/14/2019   Procedure: LAPAROSCOPIC CHOLECYSTECTOMY WITH POSSIBLE  INTRAOPERATIVE CHOLANGIOGRAM;  Surgeon: Darnell Level, MD;  Location: [facility];  Service: General;  Laterality: N/A;   CYSTOSCOPY N/A 09/11/2021   Procedure: Bluford Kaufmann;  Surgeon: Patton Salles, MD;  Location: [facility];  Service: Gynecology;  Laterality: N/A;   CYSTOSCOPY/URETEROSCOPY/HOLMIUM LASER Left 01/31/2022   Procedure: LEFT URETEROSCOPY/HOLMIUM LASER;  Surgeon: Despina Arias, MD;  Location: [facility];  Service: Urology;  Laterality: Left;   DILITATION & CURRETTAGE/HYSTROSCOPY WITH HYDROTHERMAL ABLATION N/A 06/22/2016   Procedure: DILATATION & CURETTAGE/HYSTEROSCOPY WITH HYDROTHERMAL ABLATION;  Surgeon: Brock Bad, MD;  Location: [facility];  Service: Gynecology;  Laterality: N/A;   TOTAL LAPAROSCOPIC HYSTERECTOMY WITH SALPINGECTOMY Bilateral 09/11/2021   Procedure: TOTAL LAPAROSCOPIC HYSTERECTOMY WITH SALPINGECTOMY;  Surgeon: Patton Salles, MD;  Location: [facility];  Service: Gynecology;  Laterality: Bilateral;   TUBAL LIGATION     09-28-2015   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   Smoking 10/26/2021   Postoperative intra-abdominal abscess    Medication monitoring encounter    Pelvic infection in female 09/24/2021   Status post laparoscopic hysterectomy 09/11/2021   Cholelithiasis 01/13/2019   Pre-diabetes 11/15/2016   History of bilateral tubal ligation 09/28/2015   Tobacco abuse 09/28/2015    PCP: [name] NP  REFERRING PROVIDER: Juanda Chance, NP  REFERRING DIAG:  M54.50,G89.29 (ICD-10-CM) - Chronic bilateral low back pain without sciatica M79.18 (ICD-10-CM) - Myofascial pain syndrome M47.819 (ICD-10-CM) - Spondylosis without myelopathy or radiculopathy  Rationale for Evaluation and Treatment: Rehabilitation  THERAPY DIAG:  Other low back pain  Difficulty in walking, not elsewhere classified  Pain in left hip  ONSET DATE: 01/2023 "a couple months"  SUBJECTIVE:                                                                                                                                                                                            SUBJECTIVE STATEMENT: Continued similar complaints with bending forward motion. Otherwise reported doing ok.   PERTINENT HISTORY:  Anxiety, depression, HTN, PTSD,   PAIN:  NPRS scale: 5/10  Pain location: Lt lower back Pain description: grabbing, cramps Aggravating factors: bending  Relieving factors: medicine  PRECAUTIONS: None  WEIGHT BEARING RESTRICTIONS: No  FALLS:  Has patient fallen in last 6 months? No  LIVING ENVIRONMENT: Lives in: House/apartment  OCCUPATION: Works at [employer]  PLOF: Independent, read, watch TV  PATIENT GOALS: Reduce pain, improve work  OBJECTIVE:   PATIENT SURVEYS:  04/02/2023 FOTO eval:  44  predicted:  63  SCREENING FOR RED FLAGS: 04/02/2023 Bowel or bladder incontinence: No Cauda equina syndrome: No  COGNITION: 04/02/2023 Overall cognitive status: WFL normal      SENSATION: 04/02/2023 WFL  MUSCLE LENGTH: 04/02/2023 Passive SLR Rt 90 deg, Lt 60 deg c pain  POSTURE:  04/02/2023 Mild increase in lumbar lordosis in standing. No observed lateral shift.   PALPATION: 04/22/2023:  Palpation showed tenderness and trigger point in Lt QL, lumbar paraspinals throughout with concordant symptoms Lt lumbar  04/02/2023 Tenderness to light touch Lt lumbar, posterior/lateral Lt hip  LUMBAR ROM:   04/02/2023 : no centralization/peripheralization noted c lumbar movement.   AROM 04/02/2023 04/17/2023:   04/22/2023  Flexion To patella with pain in end range, gower's sign upon return.   To mid shin  Extension 25% WFL c ERP lumbar   Repeated x 5 in standing  similar complaints in end range, reduced complaints in standing 50 % WFL with end range Lt lumbar pain  Repeated x 5 in standing:  no change in symptoms    Right lateral flexion     Left lateral flexion     Right rotation     Left rotation      (Blank rows = not tested)  LOWER EXTREMITY  ROM:      Right 04/02/2023 Left 04/02/2023  Hip flexion    Hip extension    Hip abduction    Hip adduction    Hip  internal rotation    Hip external rotation    Knee flexion    Knee extension    Ankle dorsiflexion    Ankle plantarflexion    Ankle inversion    Ankle eversion     (Blank rows = not tested)  LOWER EXTREMITY MMT:    MMT Right 04/02/2023 Left 04/02/2023  Hip flexion 5/5 4/5 c pain  Hip extension    Hip abduction    Hip adduction    Hip internal rotation    Hip external rotation    Knee flexion 5/5 5/5  Knee extension 5/5 5/5  Ankle dorsiflexion 5/5 5/5  Ankle plantarflexion    Ankle inversion    Ankle eversion     (Blank rows = not tested)  LUMBAR SPECIAL TESTS:  04/02/2023 (-) slump bilateral, (-) crossed slr bilateral)  FUNCTIONAL TESTS:  04/02/2023 18 inch chair transfer s UE assist on 1st try without pain increase.   GAIT: 04/02/2023 [place]                    TODAY'S TREATMENT:                                                                                   DATE: 04/22/2023  Manual therapy Compression to Lt QL, skilled palpation to same with dry needling  Trigger Point Dry-Needling  Treatment instructions: Expect mild to moderate muscle soreness. Patient Consent Given: Yes Education handout provided: verbally provided Muscles treated: Lt QL, paraspinals in prone Treatment response/outcome: good overall tolerance,twitch response noted   Therex: Prone extremity lift 3 sec hold x 5 each (tried opposite arm/leg but too much) Supine SKC 15 sec x 5 bilateral Supine hamstring stretch c strap 15 sec x 3 bilateral Supine bridge 2-3 sec hold x 15 Seated green pball roll outs lumbar flexion strech to Lt, center and Rt 5 sec hold x 5 each way  Standing active lumbar flexion x 10 to tolerance (improved mobility during course of set) Nustep Lvl 6 8 mins UE/LE  TODAY'S TREATMENT:                                                                                    DATE: 04/17/2023  Manual therapy Compression to Lt QL, skilled palpation to same with dry needling  Trigger Point Dry-Needling  Treatment instructions: Expect mild to moderate muscle soreness. Patient Consent Given: Yes Education handout provided: verbally provided Muscles treated: Left lumbar paraspinals and multifidi, QL Treatment response/outcome: good overall tolerance,twitch response noted   Moist Heat 5 mins post needling with HEP review, stretching below.   Therex: Standing lumbar extension x 5 Rt sidelying regional rotation stretch 15 sec x 5 with moist heat Supine SKC 15 sec x 5 bilateral Supine marching 2 x 10 bilateral Supine pball press down 5 sec hold x 10  Nustep  Lvl 5 8 mins UE/LE   TODAY'S TREATMENT:                                                                                   DATE: 04/15/2023  Manual therapy for active compression and skilled palpation and Trigger Point Dry-Needling  Treatment instructions: Expect mild to moderate muscle soreness. Patient Consent Given: Yes Education handout provided: verbally provided Muscles treated: Left lumbar paraspinals and multifidi, QL Treatment response/outcome: good overall tolerance,twitch response noted  Supine Low trunk rotations 10 xec X 5  Supine figure 4 piriformis stretch with contralateral leg on table 30 sec X 2 Supine figure 4 stretch knee to opposite shoulder 30 sec X 2 Supine bridges X 15 Standing unilateral shoulder rows green 2X10 each side Standing unilateral shoulder extensions green 2X10 Standing unilateral chest press green 2X10 Nu step L6 X 8 min UE/LE seat #5 Standing hip extensions X 10 bilat with red Standing hip abd X 10 bilat with red Standing lumbar extension 5 sec X 10  PATIENT EDUCATION:  04/22/2023 Education details: HEP update Person educated: Patient Education method: Programmer, multimedia, Demonstration, Verbal cues, and Handouts Education comprehension: verbalized understanding,  returned demonstration, and verbal cues required  HOME EXERCISE PROGRAM: Access Code: [email or web address] URL: [email or web address] Date: 04/22/2023 Prepared by: Chyrel Masson  Exercises - Standing Lumbar Extension with Counter  - 3-5 x daily - 7 x weekly - 1 sets - 5-10 reps - Supine Lower Trunk Rotation  - 2-3 x daily - 7 x weekly - 1 sets - 3-5 reps - 15 hold - Sidelying Lumbar Rotation Stretch  - 1 x daily - 7 x weekly - 1 sets - 5 reps - 15 hold - Supine Piriformis Stretch with Foot on Ground  - 2 x daily - 7 x weekly - 1 sets - 5 reps - 15-30 hold - Supine Bridge  - 1-2 x daily - 7 x weekly - 1-2 sets - 10 reps - 2 hold - Prone Alternating Arm and Leg Lifts  - 1-2 x daily - 7 x weekly - 1-2 sets - 10 reps - 3 hold  ASSESSMENT:  CLINICAL IMPRESSION: Complaints in lower QL and paraspinals twitch response still indicated as related to concordant symptoms.  Difficulty in posterior chain postural strengthening.   Improvement in lumbar flexion mobility and tolerance c stretching and mobility activity today.  Adjusted HEP to include a few more mobility based activity as well as prone strengthening.   OBJECTIVE IMPAIRMENTS: decreased activity tolerance, decreased coordination, decreased endurance, decreased mobility, difficulty walking, decreased ROM, decreased strength, hypomobility, increased fascial restrictions, impaired perceived functional ability, increased muscle spasms, impaired flexibility, improper body mechanics, and pain.   ACTIVITY LIMITATIONS: carrying, lifting, bending, sitting, standing, squatting, sleeping, stairs, transfers, bed mobility, reach over head, and locomotion level  PARTICIPATION LIMITATIONS: meal prep, cleaning, laundry, interpersonal relationship, driving, shopping, community activity, and occupation  PERSONAL FACTORS:  Anxiety, depression, HTN, PTSD,   are also affecting patient's functional outcome.   REHAB POTENTIAL: Good  CLINICAL DECISION MAKING:  Stable/uncomplicated  EVALUATION COMPLEXITY: Low   GOALS: Goals reviewed with patient? Yes  SHORT TERM GOALS: (target date for Short term goals are 3  weeks 04/23/2023)  1. Patient will demonstrate independent use of home exercise program to maintain progress from in clinic treatments.  Goal status: Met   LONG TERM GOALS: (target dates for all long term goals are 10 weeks  06/11/2023 )   1. Patient will demonstrate/report pain at worst less than or equal to 2/10 to facilitate minimal limitation in daily activity secondary to pain symptoms.  Goal status: on going 04/17/2023   2. Patient will demonstrate independent use of home exercise program to facilitate ability to maintain/progress functional gains from skilled physical therapy services.  Goal status: on going 04/17/2023   3. Patient will demonstrate FOTO outcome > or = 63 % to indicate reduced disability due to condition.  Goal status: on going 04/17/2023   4. Patient will demonstrate lumbar extension 100 % WFL s symptoms to facilitate upright standing, walking posture at PLOF s limitation.  Goal status: on going 04/17/2023   5.  Patient will demonstrate bilateral LE MMT 5/5 throughout to facilitate usual lifting, carrying for home and work activity.   Goal status: on going 04/17/2023   6.  Patient will demonstrate/report ability to sleep s restriction due to symptoms.  Goal status: on going 04/17/2023   7.  Patient will demonstrate ability to stand/walk s restriction due to symptoms for exercise and work.  Goal Status: on going 04/17/2023  PLAN:  PT FREQUENCY: 1-2x/week  PT DURATION: 10 weeks  PLANNED INTERVENTIONS: Therapeutic exercises, Therapeutic activity, Neuro Muscular re-education, Balance training, Gait training, Patient/Family education, Joint mobilization, Stair training, DME instructions, Dry Needling, Electrical stimulation, Cryotherapy, vasopneumatic device, Moist heat, Taping, Traction Ultrasound,  Ionotophoresis 4mg /ml Dexamethasone, and aquatic therapy, Manual therapy.  All included unless contraindicated  PLAN FOR NEXT SESSION: Plank introduction.  Review of additional HEP response.    Chyrel Masson, PT, DPT, OCS, ATC 04/22/23  3:52 PM

## 2023-04-24 ENCOUNTER — Ambulatory Visit (INDEPENDENT_AMBULATORY_CARE_PROVIDER_SITE_OTHER): Payer: BC Managed Care – PPO

## 2023-04-24 DIAGNOSIS — L501 Idiopathic urticaria: Secondary | ICD-10-CM | POA: Diagnosis not present

## 2023-04-25 ENCOUNTER — Encounter: Payer: BC Managed Care – PPO | Admitting: Rehabilitative and Restorative Service Providers"

## 2023-04-29 ENCOUNTER — Ambulatory Visit (INDEPENDENT_AMBULATORY_CARE_PROVIDER_SITE_OTHER): Payer: BC Managed Care – PPO | Admitting: Rehabilitative and Restorative Service Providers"

## 2023-04-29 ENCOUNTER — Ambulatory Visit: Payer: BC Managed Care – PPO | Admitting: Physical Medicine and Rehabilitation

## 2023-04-29 ENCOUNTER — Encounter: Payer: Self-pay | Admitting: Physical Medicine and Rehabilitation

## 2023-04-29 ENCOUNTER — Encounter: Payer: Self-pay | Admitting: Rehabilitative and Restorative Service Providers"

## 2023-04-29 DIAGNOSIS — R262 Difficulty in walking, not elsewhere classified: Secondary | ICD-10-CM | POA: Diagnosis not present

## 2023-04-29 DIAGNOSIS — G8929 Other chronic pain: Secondary | ICD-10-CM | POA: Diagnosis not present

## 2023-04-29 DIAGNOSIS — M47819 Spondylosis without myelopathy or radiculopathy, site unspecified: Secondary | ICD-10-CM | POA: Diagnosis not present

## 2023-04-29 DIAGNOSIS — M545 Low back pain, unspecified: Secondary | ICD-10-CM

## 2023-04-29 DIAGNOSIS — M25552 Pain in left hip: Secondary | ICD-10-CM | POA: Diagnosis not present

## 2023-04-29 DIAGNOSIS — M7918 Myalgia, other site: Secondary | ICD-10-CM | POA: Diagnosis not present

## 2023-04-29 DIAGNOSIS — M5459 Other low back pain: Secondary | ICD-10-CM

## 2023-04-29 MED ORDER — MELOXICAM 15 MG PO TABS: 15.0000 mg | ORAL_TABLET | Freq: Every day | ORAL | 0 refills | Status: DC

## 2023-04-29 NOTE — Therapy (Addendum)
OUTPATIENT PHYSICAL THERAPY TREATMENT / DISCHARGE   Patient Name: Brianna Gutierrez MRN: [id number] DOB:[birth year], 47 y.o., female Today's Date: 04/29/2023  END OF SESSION:  PT End of Session - 04/29/23 1434     Visit Number 7    Number of Visits 20    Date for PT Re-Evaluation 06/11/23    Authorization Type [employer] 10% coinsurance - 60 visit    Progress Note Due on Visit 10    PT Start Time 1433    PT Stop Time 1512    PT Time Calculation (min) 39 min    Activity Tolerance Patient tolerated treatment well    Behavior During Therapy WFL for tasks assessed/performed                Past Medical History:  Diagnosis Date   Abnormal Pap smear of cervix    Anxiety    Depression    Dysmenorrhea    Eczema    History of COVID-19 10/24/2020   loss of taste and smellbody aches and cough x 4 days all symptoms resolved   History of kidney stones    Hypertension    Pre-diabetes    PTSD (post-traumatic stress disorder)    Rash 08/31/2021   comes and goes on legs saw dermetology no rash now ? food allergy   SUI (stress urinary incontinence, female)    Trichomonas infection 02/2021   Uterine fibroid    Past Surgical History:  Procedure Laterality Date   BLADDER SUSPENSION N/A 09/11/2021   Procedure: TRANSVAGINAL TAPE (TVT) PROCEDURE;  Surgeon: Patton Salles, MD;  Location: [facility];  Service: Gynecology;  Laterality: N/A;   CESAREAN SECTION  1997   x 1   CHOLECYSTECTOMY N/A 01/14/2019   Procedure: LAPAROSCOPIC CHOLECYSTECTOMY WITH POSSIBLE  INTRAOPERATIVE CHOLANGIOGRAM;  Surgeon: Darnell Level, MD;  Location: [facility];  Service: General;  Laterality: N/A;   CYSTOSCOPY N/A 09/11/2021   Procedure: Bluford Kaufmann;  Surgeon: Patton Salles, MD;  Location: [facility];  Service: Gynecology;  Laterality: N/A;   CYSTOSCOPY/URETEROSCOPY/HOLMIUM LASER Left 01/31/2022   Procedure: LEFT URETEROSCOPY/HOLMIUM LASER;  Surgeon: Despina Arias, MD;  Location: [facility];  Service: Urology;  Laterality: Left;   DILITATION & CURRETTAGE/HYSTROSCOPY WITH HYDROTHERMAL ABLATION N/A 06/22/2016   Procedure: DILATATION & CURETTAGE/HYSTEROSCOPY WITH HYDROTHERMAL ABLATION;  Surgeon: Brock Bad, MD;  Location: [facility];  Service: Gynecology;  Laterality: N/A;   TOTAL LAPAROSCOPIC HYSTERECTOMY WITH SALPINGECTOMY Bilateral 09/11/2021   Procedure: TOTAL LAPAROSCOPIC HYSTERECTOMY WITH SALPINGECTOMY;  Surgeon: Patton Salles, MD;  Location: [facility];  Service: Gynecology;  Laterality: Bilateral;   TUBAL LIGATION     09-28-2015   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   Smoking 10/26/2021   Postoperative intra-abdominal abscess    Medication monitoring encounter    Pelvic infection in female 09/24/2021   Status post laparoscopic hysterectomy 09/11/2021   Cholelithiasis 01/13/2019   Pre-diabetes 11/15/2016   History of bilateral tubal ligation 09/28/2015   Tobacco abuse 09/28/2015    PCP: [name] NP  REFERRING PROVIDER: Juanda Chance, NP  REFERRING DIAG: M54.50,G89.29 (ICD-10-CM) - Chronic bilateral low back pain without sciatica M79.18 (ICD-10-CM) - Myofascial pain syndrome M47.819 (ICD-10-CM) - Spondylosis without myelopathy or radiculopathy  Rationale for Evaluation and Treatment: Rehabilitation  THERAPY DIAG:  Other low back pain  Difficulty in walking, not elsewhere classified  Pain in left hip  ONSET DATE: 01/2023 "a couple months"  SUBJECTIVE:                                                                                                                                                                                            SUBJECTIVE STATEMENT: She indicated "better"  but has cramp still with bending down.  She reported back pain was going better.    PERTINENT HISTORY:  Anxiety, depression, HTN, PTSD,   PAIN:  NPRS scale:  no pain at rest Pain location: Lt lower back Pain description: grabbing, cramps Aggravating factors: bending  Relieving factors: medicine  PRECAUTIONS: None  WEIGHT BEARING RESTRICTIONS: No  FALLS:  Has patient fallen in last 6 months? No  LIVING ENVIRONMENT: Lives in: House/apartment  OCCUPATION: Works at [employer]  PLOF: Independent, read, watch TV  PATIENT GOALS: Reduce pain, improve work  OBJECTIVE:   PATIENT SURVEYS:  04/29/2023:  FOTO 63  04/02/2023 FOTO eval:  44  predicted:  63  SCREENING FOR RED FLAGS: 04/02/2023 Bowel or bladder incontinence: No Cauda equina syndrome: No  COGNITION: 04/02/2023 Overall cognitive status: WFL normal      SENSATION: 04/02/2023 WFL  MUSCLE LENGTH: 04/02/2023 Passive SLR Rt 90 deg, Lt 60 deg c pain  POSTURE:  04/02/2023 Mild increase in lumbar lordosis in standing. No observed lateral shift.   PALPATION: 04/22/2023:  Palpation showed tenderness and trigger point in Lt QL, lumbar paraspinals throughout with concordant symptoms Lt lumbar  04/02/2023 Tenderness to light touch Lt lumbar, posterior/lateral Lt hip  LUMBAR ROM:   04/02/2023 : no centralization/peripheralization noted c lumbar movement.   AROM 04/02/2023 04/17/2023:   04/22/2023  Flexion To patella with pain in end range, gower's sign upon return.   To mid shin  Extension 25% WFL c ERP lumbar   Repeated x 5 in standing  similar complaints in end range, reduced complaints in standing 50 % WFL with end range Lt lumbar pain  Repeated x 5 in standing:  no change in symptoms    Right lateral flexion     Left lateral flexion     Right  rotation     Left rotation      (Blank rows = not tested)  LOWER EXTREMITY ROM:      Right 04/02/2023 Left 04/02/2023  Hip flexion  Hip extension    Hip abduction    Hip adduction    Hip internal rotation    Hip external rotation    Knee flexion    Knee extension    Ankle dorsiflexion    Ankle plantarflexion    Ankle inversion    Ankle eversion     (Blank rows = not tested)  LOWER EXTREMITY MMT:    MMT Right 04/02/2023 Left 04/02/2023  Hip flexion 5/5 4/5 c pain  Hip extension    Hip abduction    Hip adduction    Hip internal rotation    Hip external rotation    Knee flexion 5/5 5/5  Knee extension 5/5 5/5  Ankle dorsiflexion 5/5 5/5  Ankle plantarflexion    Ankle inversion    Ankle eversion     (Blank rows = not tested)  LUMBAR SPECIAL TESTS:  04/02/2023 (-) slump bilateral, (-) crossed slr bilateral)  FUNCTIONAL TESTS:  04/02/2023 18 inch chair transfer s UE assist on 1st try without pain increase.   GAIT: 04/02/2023 [place]                    TODAY'S TREATMENT:                                                                                   DATE: 04/29/2023  Manual therapy Compression to Lt QL, skilled palpation to same with dry needling  Trigger Point Dry-Needling  Treatment instructions: Expect mild to moderate muscle soreness. Patient Consent Given: Yes Education handout provided: verbally provided Muscles treated: Lt QL, paraspinals in prone Treatment response/outcome: good overall tolerance,twitch response noted   Therex: Prone extremity lift 3 sec hold x 10 (c moist heat) Qruped arm/leg reach 3 sec hold x 10 bilateral (hand outstretched but touching table for balance) Supine SLR 2 x 10 performed bilaterally SKC 15 sec hold x 3 bilateral Seated green pball roll outs lumbar flexion strech to Lt, center and Rt 5 sec hold x 10 each way     TODAY'S TREATMENT:                                                                                   DATE:  04/22/2023  Manual therapy Compression to Lt QL, skilled palpation to same with dry needling  Trigger Point Dry-Needling  Treatment instructions: Expect mild to moderate muscle soreness. Patient Consent Given: Yes Education handout provided: verbally provided Muscles treated: Lt QL, paraspinals in prone Treatment response/outcome: good overall tolerance,twitch response noted   Therex: Prone extremity lift 3 sec hold x 5 each (tried opposite arm/leg but too much) Supine SKC 15 sec x 5 bilateral Supine hamstring stretch c strap 15 sec x 3 bilateral Supine bridge 2-3 sec hold x 15 Seated green pball roll outs lumbar flexion strech to Lt, center and Rt 5 sec hold  x 5 each way  Standing active lumbar flexion x 10 to tolerance (improved mobility during course of set) Nustep Lvl 6 8 mins UE/LE  TODAY'S TREATMENT:                                                                                   DATE: 04/17/2023  Manual therapy Compression to Lt QL, skilled palpation to same with dry needling  Trigger Point Dry-Needling  Treatment instructions: Expect mild to moderate muscle soreness. Patient Consent Given: Yes Education handout provided: verbally provided Muscles treated: Left lumbar paraspinals and multifidi, QL Treatment response/outcome: good overall tolerance,twitch response noted   Moist Heat 5 mins post needling with HEP review, stretching below.   Therex: Standing lumbar extension x 5 Rt sidelying regional rotation stretch 15 sec x 5 with moist heat Supine SKC 15 sec x 5 bilateral Supine marching 2 x 10 bilateral Supine pball press down 5 sec hold x 10  Nustep Lvl 5 8 mins UE/LE   PATIENT EDUCATION:  04/22/2023 Education details: HEP update Person educated: Patient Education method: Programmer, multimedia, Demonstration, Verbal cues, and Handouts Education comprehension: verbalized understanding, returned demonstration, and verbal cues required  HOME EXERCISE PROGRAM: Access Code:  [email or web address] URL: [email or web address] Date: 04/22/2023 Prepared by: Chyrel Masson  Exercises - Standing Lumbar Extension with Counter  - 3-5 x daily - 7 x weekly - 1 sets - 5-10 reps - Supine Lower Trunk Rotation  - 2-3 x daily - 7 x weekly - 1 sets - 3-5 reps - 15 hold - Sidelying Lumbar Rotation Stretch  - 1 x daily - 7 x weekly - 1 sets - 5 reps - 15 hold - Supine Piriformis Stretch with Foot on Ground  - 2 x daily - 7 x weekly - 1 sets - 5 reps - 15-30 hold - Supine Bridge  - 1-2 x daily - 7 x weekly - 1-2 sets - 10 reps - 2 hold - Prone Alternating Arm and Leg Lifts  - 1-2 x daily - 7 x weekly - 1-2 sets - 10 reps - 3 hold  ASSESSMENT:  CLINICAL IMPRESSION: FOTO reassessment showed marked improvement in activity tolerance compared to evaluation.  Continue impairment/symptoms noted in bending/stooping movement.   OBJECTIVE IMPAIRMENTS: decreased activity tolerance, decreased coordination, decreased endurance, decreased mobility, difficulty walking, decreased ROM, decreased strength, hypomobility, increased fascial restrictions, impaired perceived functional ability, increased muscle spasms, impaired flexibility, improper body mechanics, and pain.   ACTIVITY LIMITATIONS: carrying, lifting, bending, sitting, standing, squatting, sleeping, stairs, transfers, bed mobility, reach over head, and locomotion level  PARTICIPATION LIMITATIONS: meal prep, cleaning, laundry, interpersonal relationship, driving, shopping, community activity, and occupation  PERSONAL FACTORS:  Anxiety, depression, HTN, PTSD,   are also affecting patient's functional outcome.   REHAB POTENTIAL: Good  CLINICAL DECISION MAKING: Stable/uncomplicated  EVALUATION COMPLEXITY: Low   GOALS: Goals reviewed with patient? Yes  SHORT TERM GOALS: (target date for Short term goals are 3 weeks 04/23/2023)  1. Patient will demonstrate independent use of home exercise program to maintain progress from in clinic  treatments.  Goal status: Met   LONG TERM GOALS: (target dates for all long term goals are 10 weeks  06/11/2023 )   1. Patient will demonstrate/report pain at worst less than or equal to 2/10 to facilitate minimal limitation in daily activity secondary to pain symptoms.  Goal status: on going 04/17/2023   2. Patient will demonstrate independent use of home exercise program to facilitate ability to maintain/progress functional gains from skilled physical therapy services.  Goal status: on going 04/17/2023   3. Patient will demonstrate FOTO outcome > or = 63 % to indicate reduced disability due to condition.  Goal status: Met 04/29/2023  4. Patient will demonstrate lumbar extension 100 % WFL s symptoms to facilitate upright standing, walking posture at PLOF s limitation.  Goal status: on going 04/17/2023   5.  Patient will demonstrate bilateral LE MMT 5/5 throughout to facilitate usual lifting, carrying for home and work activity.   Goal status: on going 04/17/2023   6.  Patient will demonstrate/report ability to sleep s restriction due to symptoms.  Goal status: on going 04/17/2023   7.  Patient will demonstrate ability to stand/walk s restriction due to symptoms for exercise and work.  Goal Status: on going 04/17/2023  PLAN:  PT FREQUENCY: 1-2x/week  PT DURATION: 10 weeks  PLANNED INTERVENTIONS: Therapeutic exercises, Therapeutic activity, Neuro Muscular re-education, Balance training, Gait training, Patient/Family education, Joint mobilization, Stair training, DME instructions, Dry Needling, Electrical stimulation, Cryotherapy, vasopneumatic device, Moist heat, Taping, Traction Ultrasound, Ionotophoresis 4mg /ml Dexamethasone, and aquatic therapy, Manual therapy.  All included unless contraindicated  PLAN FOR NEXT SESSION: Dry needling as desired.  Continue to try to improve flexion mobility and overall core/posture strength.    Chyrel Masson, PT, DPT, OCS, ATC 04/29/23  3:11  PM   PHYSICAL THERAPY DISCHARGE SUMMARY  Visits from Start of Care: 7  Current functional level related to goals / functional outcomes: See note   Remaining deficits: See note   Education / Equipment: HEP  Patient goals were partially met. Patient is being discharged due to not returning since the last visit.  Chyrel Masson, PT, DPT, OCS, ATC 06/12/23  11:18 AM

## 2023-04-29 NOTE — Progress Notes (Signed)
/  Brianna Gutierrez - 47 y.o. female MRN [id number]  Date of birth: October 08, 1976  Office Visit Note: Visit Date: 04/29/2023 PCP: Brianna Fendt, NP Referred by: Brianna Fendt, NP  Subjective: Chief Complaint  Patient presents with   Lower Back - Pain   HPI: Brianna Gutierrez Brianna Gutierrez is a 47 y.o. female who comes in today for evaluation of chronic bilateral lower back pain, she is here today in follow up from regimen of formal physical therapy. Pain ongoing for several months, worsens with bending and laying flat. She describes pain as aching sensation, currently rates as 1 out of 10. Some relief of pain with home exercise regimen, rest and use of medications. She continues with meloxicam and baclofen as needed. Significant relief of pain with continued formal physical therapy.  Feels dry needling treatments have helped to alleviate her pain.  Lumbar x-rays from 2021 exhibit normal anatomical alignment, no spondylolisthesis, degenerative changes noted to lower lumbar spine. Overall, states she feels much better, she reports greater than 50% relief of pain with conservative therapies including physical therapy. She continues to work at [employer] and [employer]. Patient denies focal weakness, numbness and tingling. No recent trauma or falls.    Review of Systems  Musculoskeletal:  Positive for back pain and myalgias.  Neurological:  Negative for tingling, sensory change, focal weakness and weakness.  All other systems reviewed and are negative.  Otherwise per HPI.  Assessment & Plan: Visit Diagnoses:    ICD-10-CM   1. Chronic bilateral low back pain without sciatica  M54.50    G89.29     2. Spondylosis without myelopathy or radiculopathy  M47.819     3. Myofascial pain syndrome  M79.18        Plan: Findings:  Chronic bilateral lower back pain.  Significant and sustained relief of pain with conservative therapies including formal physical therapy.  She is now able to better manage her  pain, does continue with medications as needed.  Patient's clinical presentation and exam are consistent with myofascial pain syndrome, could also be facet mediated pain.  I instructed patient to continue with formal physical therapy, I am happy to place order for additional sessions if needed.  If her pain persist or worsens I discussed the possibility of obtaining lumbar MRI imaging.  Depending on results of lumbar MRI imaging would consider performing lumbar injections.  Patient is agreeable with plan, she would like to continue with physical therapy.  I encouraged her to remain active as tolerated.  She is to follow-up with Brianna Gutierrez as needed.    Meds & Orders:  Meds ordered this encounter  Medications   meloxicam (MOBIC) 15 MG tablet    Sig: Take 1 tablet (15 mg total) by mouth daily.    Dispense:  30 tablet    Refill:  0   No orders of the defined types were placed in this encounter.   Follow-up: Return if symptoms worsen or fail to improve.   Procedures: No procedures performed      Clinical History: No specialty comments available.   She reports that she has been smoking cigarettes. She has a 10.00 pack-year smoking history. She has never been exposed to tobacco smoke. She has never used smokeless tobacco.  Recent Labs    07/30/22 1112  HGBA1C 6.0*    Objective:  VS:  HT:    WT:   BMI:     BP:   HR: bpm  TEMP: ( )  RESP:  Physical Exam Vitals and nursing note reviewed.  HENT:     Head: Normocephalic and atraumatic.     Right Ear: External ear normal.     Left Ear: External ear normal.     Nose: Nose normal.     Mouth/Throat:     Mouth: Mucous membranes are moist.  Eyes:     Extraocular Movements: Extraocular movements intact.  Cardiovascular:     Rate and Rhythm: Normal rate.     Pulses: Normal pulses.  Pulmonary:     Effort: Pulmonary effort is normal.  Abdominal:     General: Abdomen is flat. There is no distension.  Musculoskeletal:        General:  Tenderness present.     Cervical back: Normal range of motion.     Comments: Patient rises from seated position to standing without difficulty. Good lumbar range of motion. No pain noted with facet loading. 5/5 strength noted with bilateral hip flexion, knee flexion/extension, ankle dorsiflexion/plantarflexion and EHL. No clonus noted bilaterally. No pain upon palpation of greater trochanters. No pain with internal/external rotation of bilateral hips. Sensation intact bilaterally. Tenderness noted upon palpation of bilateral lumbar paraspinal regions. Negative slump test bilaterally. Ambulates without aid, gait steady.   Skin:    General: Skin is warm and dry.     Capillary Refill: Capillary refill takes less than 2 seconds.  Neurological:     General: No focal deficit present.     Mental Status: She is alert and oriented to person, place, and time.  Psychiatric:        Mood and Affect: Mood normal.        Behavior: Behavior normal.     Ortho Exam  Imaging: No results found.  Past Medical/Family/Surgical/Social History: Medications & Allergies reviewed per EMR, new medications updated. 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   Smoking 10/26/2021   Postoperative intra-abdominal abscess    Medication monitoring encounter    Pelvic infection in female 09/24/2021   Status post laparoscopic hysterectomy 09/11/2021   Cholelithiasis 01/13/2019   Pre-diabetes 11/15/2016   History of bilateral tubal ligation 09/28/2015   Tobacco abuse 09/28/2015   Past Medical History:  Diagnosis Date   Abnormal Pap  smear of cervix    Anxiety    Depression    Dysmenorrhea    Eczema    History of COVID-19 10/24/2020   loss of taste and smellbody aches and cough x 4 days all symptoms resolved   History of kidney stones    Hypertension    Pre-diabetes    PTSD (post-traumatic stress disorder)    Rash 08/31/2021   comes and goes on legs saw dermetology no rash now ? food allergy   SUI (stress urinary incontinence, female)    Trichomonas infection 02/2021   Uterine fibroid    Family History  Problem Relation Age of Onset   Hypertension Maternal Grandmother    Diabetes Maternal Grandfather    Allergic rhinitis Daughter    Allergic rhinitis Daughter    Allergic rhinitis Son    Allergic rhinitis Son    Colon cancer Neg Hx  Esophageal cancer Neg Hx    Rectal cancer Neg Hx    Stomach cancer Neg Hx    Past Surgical History:  Procedure Laterality Date   BLADDER SUSPENSION N/A 09/11/2021   Procedure: TRANSVAGINAL TAPE (TVT) PROCEDURE;  Surgeon: Patton Salles, MD;  Location[facility];  Service: Gynecology;  Laterality: N/A;   CESAREAN SECTION  1997   x 1   CHOLECYSTECTOMY N/A 01/14/2019   Procedure: LAPAROSCOPIC CHOLECYSTECTOMY WITH POSSIBLE  INTRAOPERATIVE CHOLANGIOGRAM;  Surgeon: Darnell Level, MD;  Location: [facility];  Service: General;  Laterality: N/A;   CYSTOSCOPY N/A 09/11/2021   Procedure: Bluford Kaufmann;  Surgeon: Patton Salles, MD;  Location: [facility];  Service: Gynecology;  Laterality: N/A;   CYSTOSCOPY/URETEROSCOPY/HOLMIUM LASER Left 01/31/2022   Procedure: LEFT URETEROSCOPY/HOLMIUM LASER;  Surgeon: Despina Arias, MD;  Location: [facility];  Service: Urology;  Laterality: Left;   DILITATION & CURRETTAGE/HYSTROSCOPY WITH HYDROTHERMAL ABLATION N/A 06/22/2016   Procedure: DILATATION & CURETTAGE/HYSTEROSCOPY WITH HYDROTHERMAL ABLATION;  Surgeon: Brock Bad, MD;  Location: [facility];  Service: Gynecology;  Laterality: N/A;   TOTAL  LAPAROSCOPIC HYSTERECTOMY WITH SALPINGECTOMY Bilateral 09/11/2021   Procedure: TOTAL LAPAROSCOPIC HYSTERECTOMY WITH SALPINGECTOMY;  Surgeon: Patton Salles, MD;  Location: [facility];  Service: Gynecology;  Laterality: Bilateral;   TUBAL LIGATION     09-28-2015   Social History   Occupational History   Not on file  Tobacco Use   Smoking status: Every Day    Packs/day: 0.50    Years: 20.00    Additional pack years: 0.00    Total pack years: 10.00    Types: Cigarettes    Passive exposure: Never   Smokeless tobacco: Never  Vaping Use   Vaping Use: Former  Substance and Sexual Activity   Alcohol use: Not Currently   Drug use: Never   Sexual activity: Not Currently    Partners: Male    Birth control/protection: Surgical    Comment: [facility] 09/11/21

## 2023-04-29 NOTE — Progress Notes (Signed)
Functional Pain Scale - descriptive words and definitions  Moderate (4)   Constantly aware of pain, can complete ADLs with modification/sleep marginally affected at times/passive distraction is of no use, but active distraction gives some relief. Moderate range order  Average Pain 5  Lower back pain on left side that radiates into left leg.

## 2023-05-01 ENCOUNTER — Encounter: Payer: Self-pay | Admitting: Allergy

## 2023-05-01 ENCOUNTER — Encounter: Payer: BC Managed Care – PPO | Admitting: Rehabilitative and Restorative Service Providers"

## 2023-05-08 ENCOUNTER — Ambulatory Visit (INDEPENDENT_AMBULATORY_CARE_PROVIDER_SITE_OTHER): Payer: BC Managed Care – PPO | Admitting: Allergy

## 2023-05-08 ENCOUNTER — Encounter: Payer: Self-pay | Admitting: Allergy

## 2023-05-08 ENCOUNTER — Other Ambulatory Visit: Payer: Self-pay

## 2023-05-08 VITALS — BP 140/70 | HR 98 | Temp 98.4°F | Resp 16 | Ht 62.0 in | Wt 198.5 lb

## 2023-05-08 DIAGNOSIS — L299 Pruritus, unspecified: Secondary | ICD-10-CM

## 2023-05-08 DIAGNOSIS — J3089 Other allergic rhinitis: Secondary | ICD-10-CM

## 2023-05-08 DIAGNOSIS — R0602 Shortness of breath: Secondary | ICD-10-CM

## 2023-05-08 DIAGNOSIS — R21 Rash and other nonspecific skin eruption: Secondary | ICD-10-CM

## 2023-05-08 DIAGNOSIS — T781XXD Other adverse food reactions, not elsewhere classified, subsequent encounter: Secondary | ICD-10-CM

## 2023-05-08 DIAGNOSIS — L501 Idiopathic urticaria: Secondary | ICD-10-CM

## 2023-05-08 MED ORDER — TRIAMCINOLONE ACETONIDE 0.1 % EX OINT: 1.0000 | TOPICAL_OINTMENT | Freq: Two times a day (BID) | CUTANEOUS | 1 refills | Status: DC | PRN

## 2023-05-08 NOTE — Assessment & Plan Note (Addendum)
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

## 2023-05-08 NOTE — Assessment & Plan Note (Signed)
Past history - Increased rhinitis symptoms recently.  2023 skin prick testing showed: Positive to mold and dog.  Continue environmental control measures.  

## 2023-05-08 NOTE — Assessment & Plan Note (Signed)
Past history - 2023 bloodwork positive to pork and lamb. Tolerates bananas and strawberries.  Continue to avoid red meat.

## 2023-05-08 NOTE — Progress Notes (Signed)
Follow Up Note  RE: Brianna Gutierrez MRN: [id number] DOB: November 21, 1975 Date of Office Visit: 05/08/2023  Referring provider: [name], NP Primary care provider: [name], NP  Chief Complaint: Urticaria (Last week had a hive flare - chest and stomach did not take any medication dure flare )  History of Present Illness: I had the pleasure of seeing Brianna Gutierrez for a follow up visit at the [facility] on 05/08/2023. She is a 47 y.o. female, who is being followed for CIU on Xolair 300 mg every 4 weeks, shortness of breath, adverse food reaction, allergic rhinitis. Her previous allergy office visit was on 12/24/2022 with Dr. Selena Gutierrez. Today is a regular follow up visit.  Chronic idiopathic urticaria Patient stopped both famotidine and allegra 4 months ago with no breakouts. She started to break out about 1 week ago on her chest and abdominal area. No one has rashes at home. This rash does not look like her typical hives.   She used benadryl and anti itch cream with no benefit. Denies changes in diet, med, personal care products. No recent travel. No bug/insect bites.   Currently on Xolair injections every 4 weeks with no issues.   Pruritus No itching until a week ago. 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    Food  Tolerates bananas and strawberries. Avoiding red meat.    Allergic rhinitis Stable.    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 --Recommend labs today to check CBC, CMP, Hgb electrophoresis, alpha thalassemia DNA, retic panel and iron panel. --RTC based on above workup. "  Assessment and Plan: Brianna Gutierrez is a 47 y.o. female with: Chronic idiopathic urticaria Past history - Pruritic rash for 1 year lasting 1 day at a  time.  No triggers noted.  Occurs a few times per month.  Concerned whether strawberries, bananas and seafood may be contributing to the symptoms.  Saw dermatology virtually in the past with no benefit. Interim history - stopped famotidine and allegra with no flares. 1 week noticed some bumps on her abdominal area which do not look like her typical hives.  Current rash does not look like hives - I question insect/bug bites.  Take pictures if it worsens and let me know.  Use triamcinolone 0.1% ointment twice a day as needed for rash flares. Do not use on the face, neck, armpits or groin area. Do not use more than 3 weeks in a row.  Continue proper skin care. Keep track of flares. Continue to avoid pork, lamb and beef jerky. During hive flare start the following medications until symptoms subside. Allegra 180mg  1-2 times per day. Famotidine 20mg  twice a day Avoid the following potential triggers: alcohol, tight clothing, NSAIDs, hot showers and getting overheated. Change Xolair 300mg  to every 5 weeks - change next appointment to July 31st.  Will do very 5 weeks 3 times then if no issues will change to every 6 weeks. If you have symptoms/breakouts then let us know.   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  Has been using it daily since prescribed 1 month ago.  Saw pulmonology in the past due  to PE provoked after hysterectomy.  CT chest in April 2023 was unremarkable. 2023 spirometry showed restriction with 2% improvement FEV1 postbronchodilator treatment.  Clinically feeling improved. Interim history - no issues and no albuterol use.  Today's spirometry was normal. Monitor symptoms.  Other allergic rhinitis Past history - Increased rhinitis symptoms recently.  2023 skin prick testing showed: Positive to  mold and dog.  Continue environmental control measures.   Other adverse food reactions, not elsewhere classified, subsequent encounter Past history - 2023 bloodwork positive to pork and lamb. Tolerates bananas and strawberries.  Continue to avoid red meat.  Return in about 6 months (around 11/08/2023).  Meds ordered this encounter  Medications   triamcinolone ointment (KENALOG) 0.1 %    Sig: Apply 1 Application topically 2 (two) times daily as needed (rash flare). Do not use on the face, neck, armpits or groin area. Do not use more than 3 weeks in a row.    Dispense:  30 g    Refill:  1   Lab Orders  No laboratory test(s) ordered today    Diagnostics: Spirometry:  Tracings reviewed. Her effort: Good reproducible efforts. FVC: 1.86L FEV1: 1.56L, 68% predicted FEV1/FVC ratio: 84% Interpretation: Spirometry consistent with possible restrictive disease.  Please see scanned spirometry results for details.  Medication List:  Current Outpatient Medications  Medication Sig Dispense Refill   acetaminophen (TYLENOL) 500 MG tablet Take 2 tablets (1,000 mg total) by mouth every 6 (six) hours as needed. 30 tablet 0   albuterol (VENTOLIN HFA) 108 (90 Base) MCG/ACT inhaler Inhale 2 puffs into the lungs every 6 (six) hours as needed for wheezing or shortness of breath. 8 g 2   amLODipine (NORVASC) 10 MG tablet Take 1 tablet (10 mg total) by mouth every evening. 90 tablet 0   baclofen (LIORESAL) 10 MG tablet Take 1 tablet (10 mg total) by mouth 3 (three) times daily. 30 each 0   cephALEXin (KEFLEX) 500 MG capsule Take 1 capsule (500 mg total) by mouth 3 (three) times daily. 21 capsule 0   Efinaconazole 10 % SOLN Apply 1 drop topically daily. 4 mL 11   EPINEPHrine (EPIPEN 2-PAK) 0.3 mg/0.3 mL IJ SOAJ injection Inject 0.3 mg into the muscle as needed for anaphylaxis. 2 each 2   famotidine (PEPCID) 20 MG tablet Take 1 tablet (20 mg total) by mouth 2 (two) times daily. 60 tablet 3   ferrous sulfate  325 (65 FE) MG EC tablet Take 1 tablet by mouth once every other day with a source of vitamin C. 30 tablet 6   fexofenadine (ALLEGRA) 180 MG tablet Take 2 tablets twice a day for hives. 120 tablet 2   fluconazole (DIFLUCAN) 150 MG tablet Take 1 tablet (150 mg total) by mouth once a week. 12 tablet 0   hydrochlorothiazide (HYDRODIURIL) 25 MG tablet Take 1 tablet (25 mg total) by mouth daily. 90 tablet 0   ibuprofen (ADVIL) 600 MG tablet Take 1 tablet (600 mg total) by mouth every 6 (six) hours as needed. 30 tablet 0   lamoTRIgine (LAMICTAL) 100 MG tablet Take 1 tablet (100 mg total) by mouth daily. 90 tablet 0   meloxicam (MOBIC) 15 MG tablet Take 1 tablet (15 mg total) by mouth daily. 30 tablet 0   meloxicam (MOBIC) 15 MG tablet Take 1 tablet (15 mg total) by mouth daily. 30 tablet 0   metFORMIN (GLUCOPHAGE) 500 MG tablet Take 1 tablet (500 mg total) by mouth 2 (two) times daily with  a meal. 180 tablet 0   methocarbamol (ROBAXIN) 500 MG tablet Take 1 tablet (500 mg total) by mouth 2 (two) times daily. 20 tablet 0   traZODone (DESYREL) 100 MG tablet Take 1 tablet (100 mg total) by mouth at bedtime as needed for sleep. Take an additional 50 mg (1/2 tab) as needed 135 tablet 0   triamcinolone ointment (KENALOG) 0.1 % Apply 1 Application topically 2 (two) times daily as needed (rash flare). Do not use on the face, neck, armpits or groin area. Do not use more than 3 weeks in a row. 30 g 1   XOLAIR 150 MG/ML prefilled syringe Inject 300 mg into the skin every 28 (twenty-eight) days.     Current Facility-Administered Medications  Medication Dose Route Frequency Provider Last Rate Last Admin   omalizumab Geoffry Paradise) prefilled syringe 300 mg  300 mg Subcutaneous Q28 days Nehemiah Settle, FNP   300 mg at 04/24/23 1338   Allergies: Allergies  Allergen Reactions   Pork-Derived Products    Tramadol     rash   Tramadol Other (See Comments)    Headache   I reviewed her past medical history, social history,  family history, and environmental history and no significant changes have been reported from her previous visit.  Review of Systems  Constitutional:  Negative for appetite change, chills, fever and unexpected weight change.  HENT:  Negative for congestion and rhinorrhea.   Eyes:  Negative for itching.  Respiratory:  Negative for cough, chest tightness, shortness of breath and wheezing.   Cardiovascular:  Negative for chest pain.  Gastrointestinal:  Negative for abdominal pain.  Genitourinary:  Negative for difficulty urinating.  Skin:  Positive for rash.  Allergic/Immunologic: Positive for environmental allergies.  Neurological:  Negative for headaches.    Objective: BP (!) 140/70   Pulse 98   Temp 98.4 F (36.9 C)   Resp 16   Ht 5\' 2"  (1.575 m)   Wt 198 lb 8 oz (90 kg)   LMP  (LMP Unknown)   SpO2 99%   BMI 36.31 kg/m  Body mass index is 36.31 kg/m. Physical Exam Vitals and nursing note reviewed.  Constitutional:      Appearance: Normal appearance. She is well-developed. She is obese.  HENT:     Head: Normocephalic and atraumatic.     Right Ear: Tympanic membrane and external ear normal.     Left Ear: Tympanic membrane and external ear normal.     Nose: Nose normal.     Mouth/Throat:     Mouth: Mucous membranes are moist.     Pharynx: Oropharynx is clear.  Eyes:     Conjunctiva/sclera: Conjunctivae normal.  Cardiovascular:     Rate and Rhythm: Normal rate and regular rhythm.     Heart sounds: Normal heart sounds. No murmur heard.    No friction rub. No gallop.  Pulmonary:     Effort: Pulmonary effort is normal.     Breath sounds: Normal breath sounds. No wheezing, rhonchi or rales.  Musculoskeletal:     Cervical back: Neck supple.  Skin:    General: Skin is warm.     Findings: Rash present.     Comments: Few scattered erythematous papular rash on abdominal area - does not look like hives. ? Insect/bug bite  Neurological:     Mental Status: She is alert and  oriented to person, place, and time.  Psychiatric:        Behavior: Behavior normal.    Previous  notes and tests were reviewed. The plan was reviewed with the patient/family, and all questions/concerned were addressed.  It was my pleasure to see Kimarie today and participate in her care. Please feel free to contact me with any questions or concerns.  Sincerely,  Wyline Mood, DO Allergy & Immunology  [facility] office: [phone] [facility] office: [phone]

## 2023-05-08 NOTE — Assessment & Plan Note (Signed)
Past history - Pruritic rash for 1 year lasting 1 day at a time.  No triggers noted.  Occurs a few times per month.  Concerned whether strawberries, bananas and seafood may be contributing to the symptoms.  Saw dermatology virtually in the past with no benefit. Interim history - stopped famotidine and allegra with no flares. 1 week noticed some bumps on her abdominal area which do not look like her typical hives.  Current rash does not look like hives - I question insect/bug bites.  Take pictures if it worsens and let me know.  Use triamcinolone 0.1% ointment twice a day as needed for rash flares. Do not use on the face, neck, armpits or groin area. Do not use more than 3 weeks in a row.  Continue proper skin care. Keep track of flares. Continue to avoid pork, lamb and beef jerky. During hive flare start the following medications until symptoms subside. Allegra 180mg  1-2 times per day. Famotidine 20mg  twice a day Avoid the following potential triggers: alcohol, tight clothing, NSAIDs, hot showers and getting overheated. Change Xolair 300mg  to every 5 weeks - change next appointment to July 31st.  Will do very 5 weeks 3 times then if no issues will change to every 6 weeks. If you have symptoms/breakouts then let us know.

## 2023-05-08 NOTE — Patient Instructions (Addendum)
Skin: Current rash does not look like hives. Take pictures if it worsens and let me know.  Use triamcinolone 0.1% ointment twice a day as needed for rash flares. Do not use on the face, neck, armpits or groin area. Do not use more than 3 weeks in a row.   Continue proper skin care. Keep track of flares. Continue to avoid pork, lamb and beef jerky. During hive flare start the following medications until symptoms subside. Allegra 180mg  1-2 times per day. Famotidine 20mg  twice a day  Avoid the following potential triggers: alcohol, tight clothing, NSAIDs, hot showers and getting overheated. Change Xolair 300mg  to every 5 weeks - change next appointment to July 31st.  Will do very 5 weeks 3 times then if no issues will change to every 6 weeks. If you have symptoms/breakouts then let us know.   Environmental allergies 2023 skin testing showed: Positive to mold and dog.  Continue environmental control measures.   Follow up with heme/onc as scheduled.  Follow up with PCP regarding your blood pressure.   Follow up in 6 months or sooner if needed.   Skin care recommendations  Bath time: Always use lukewarm water. AVOID very hot or cold water. Keep bathing time to 5-10 minutes. Do NOT use bubble bath. Use a mild soap and use just enough to wash the dirty areas. Do NOT scrub skin vigorously.  After bathing, pat dry your skin with a towel. Do NOT rub or scrub the skin.  Moisturizers and prescriptions:  ALWAYS apply moisturizers immediately after bathing (within 3 minutes). This helps to lock-in moisture. Use the moisturizer several times a day over the whole body. Good summer moisturizers include: Aveeno, CeraVe, Cetaphil. Good winter moisturizers include: Aquaphor, Vaseline, Cerave, Cetaphil, Eucerin, Vanicream. When using moisturizers along with medications, the moisturizer should be applied about one hour after applying the medication to prevent diluting effect of the medication or  moisturize around where you applied the medications. When not using medications, the moisturizer can be continued twice daily as maintenance.  Laundry and clothing: Avoid laundry products with added color or perfumes. Use unscented hypo-allergenic laundry products such as Tide free, Cheer free & gentle, and All free and clear.  If the skin still seems dry or sensitive, you can try double-rinsing the clothes. Avoid tight or scratchy clothing such as wool. Do not use fabric softeners or dyer sheets.   Mold Control Mold and fungi can grow on a variety of surfaces provided certain temperature and moisture conditions exist.  Outdoor molds grow on plants, decaying vegetation and soil. The major outdoor mold, Alternaria and Cladosporium, are found in very high numbers during hot and dry conditions. Generally, a late summer - fall peak is seen for common outdoor fungal spores. Rain will temporarily lower outdoor mold spore count, but counts rise rapidly when the rainy period ends. The most important indoor molds are Aspergillus and Penicillium. Dark, humid and poorly ventilated basements are ideal sites for mold growth. The next most common sites of mold growth are the bathroom and the kitchen. Outdoor (Seasonal) Mold Control Use air conditioning and keep windows closed. Avoid exposure to decaying vegetation. Avoid leaf raking. Avoid grain handling. Consider wearing a face mask if working in moldy areas.  Indoor (Perennial) Mold Control  Maintain humidity below 50%. Get rid of mold growth on hard surfaces with water, detergent and, if necessary, 5% bleach (do not mix with other cleaners). Then dry the area completely. If mold covers an area more  than 10 square feet, consider hiring an Gaffer. For clothing, washing with soap and water is best. If moldy items cannot be cleaned and dried, throw them away. Remove sources e.g. contaminated carpets. Repair and seal leaking roofs  or pipes. Using dehumidifiers in damp basements may be helpful, but empty the water and clean units regularly to prevent mildew from forming. All rooms, especially basements, bathrooms and kitchens, require ventilation and cleaning to deter mold and mildew growth. Avoid carpeting on concrete or damp floors, and storing items in damp areas. Pet Allergen Avoidance: Contrary to popular opinion, there are no "hypoallergenic" breeds of dogs or cats. That is because people are not allergic to an animal's hair, but to an allergen found in the animal's saliva, dander (dead skin flakes) or urine. Pet allergy symptoms typically occur within minutes. For some people, symptoms can build up and become most severe 8 to 12 hours after contact with the animal. People with severe allergies can experience reactions in public places if dander has been transported on the pet owners' clothing. Keeping an animal outdoors is only a partial solution, since homes with pets in the yard still have higher concentrations of animal allergens. Before getting a pet, ask your allergist to determine if you are allergic to animals. If your pet is already considered part of your family, try to minimize contact and keep the pet out of the bedroom and other rooms where you spend a great deal of time. As with dust mites, vacuum carpets often or replace carpet with a hardwood floor, tile or linoleum. High-efficiency particulate air (HEPA) cleaners can reduce allergen levels over time. While dander and saliva are the source of cat and dog allergens, urine is the source of allergens from rabbits, hamsters, mice and [place] pigs; so ask a non-allergic family member to clean the animal's cage. If you have a pet allergy, talk to your allergist about the potential for allergy immunotherapy (allergy shots). This strategy can often provide long-term relief.

## 2023-05-08 NOTE — Assessment & Plan Note (Signed)
Past history - Dyspnea on exertion and uses albuterol as needed with good benefit.  Has been using it daily since prescribed 1 month ago.  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  CT chest in April 2023 was unremarkable. 2023 spirometry showed restriction with 2% improvement FEV1 postbronchodilator treatment.  Clinically feeling improved. Interim history - no issues and no albuterol use.  Today's spirometry was normal. Monitor symptoms.
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
## 2023-05-09 ENCOUNTER — Telehealth (HOSPITAL_BASED_OUTPATIENT_CLINIC_OR_DEPARTMENT_OTHER): Payer: BC Managed Care – PPO | Admitting: Psychiatry

## 2023-05-09 DIAGNOSIS — F4310 Post-traumatic stress disorder, unspecified: Secondary | ICD-10-CM | POA: Diagnosis not present

## 2023-05-09 DIAGNOSIS — F331 Major depressive disorder, recurrent, moderate: Secondary | ICD-10-CM | POA: Diagnosis not present

## 2023-05-09 DIAGNOSIS — F321 Major depressive disorder, single episode, moderate: Secondary | ICD-10-CM

## 2023-05-09 MED ORDER — TRAZODONE HCL 100 MG PO TABS
100.0000 mg | ORAL_TABLET | Freq: Every evening | ORAL | 0 refills | Status: DC | PRN
Start: 2023-05-09 — End: 2023-06-24

## 2023-05-09 MED ORDER — LAMOTRIGINE 100 MG PO TABS
100.0000 mg | ORAL_TABLET | Freq: Every day | ORAL | 0 refills | Status: DC
Start: 2023-05-09 — End: 2023-08-29

## 2023-05-14 NOTE — Progress Notes (Signed)
05/16/2023 Brianna Gutierrez [id number] 08/25/1976  Referring provider: [name], NP Primary GI doctor: Dr. Barron Alvine  ASSESSMENT AND PLAN:   Anemia Patient referred from oncology for IDA, last iron and ferritin 02/06/2023 show iron 92, ferritin 19, normal saturation and TIBC elevated 505.  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 No evidence of IDA at this time, negative Hemoccult in the office. No upper GI symptoms. Colonoscopy unremarkable 2022 other than internal hemorrhoids and diverticulosis. Will recheck iron and ferritin today, if this remains normal as patient has not started iron or has not had iron infusions, no need for follow-up.  Constipation with history of internal hemorrhoids -Sitz baths, increase fiber, increase water, add miralax -Hydrocortisone supp given and external cream sent in.  -We discussed hemorrhoid banding here in the office for internal hemorrhoids if not improving with conservative treatment.  Diverticulosis of colon without hemorrhage Will call if any symptoms. Add on fiber supplement, avoid NSAIDS, information given       Patient Care Team: [name], NP as PCP - General (Nurse Practitioner) [name], NP (Nurse Practitioner)  HISTORY OF PRESENT ILLNESS: 47 y.o. female with a past medical history of anxiety, depression, eczema, HTN, uterine fibroids s/p hysterectomy 08/2021 with provoked PE, cesarean 1997, tubal ligation 2002, CCY 2020 and others listed below presents for evaluation of IDA,   Initially seen in the GI clinic on 08/11/2021 for evaluation of rectal bleeding  08/18/2021: Colonoscopy with Dr. Carloyn Manner: 2 small 2-5 mm sigmoid hyperplastic polyps, Sigmoid diverticulosis. Small internal hemorrhoids. Normal TI. Repeat in 10 years.  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  Repeat 04/2022 showed benign liver lesion.   Presents today for IDA when s/p hysterectomy.  02/06/2023  HGB 13.5  MCV 71.7 Platelets 338 02/06/2023 Iron 92 Ferritin 19, TIBC 505 02/06/2023 unaffected carrier of alpha thalassemia minor.   She has not been on iron pills.  She continues to have rectal bleeding, just started back 3 weeks ago.  On TP and in toliet, no rectal pain, burning or itching.  Her Bm's regular, occ straining.  She states the other day she had severe periumblical AB pain, improved after BM.  No melena, dypshagia, GERD.  Denies nausea/vomiting.   She denies blood thinner use.  She denies NSAID use.  She denies ETOH use.   She denies tobacco use.  She denies drug use.    She  reports that she has been smoking cigarettes. She has a 10 pack-year smoking history. She has never been exposed to tobacco smoke. She has never used smokeless tobacco. She reports that she does not currently use alcohol. She reports that she does not use drugs.  RELEVANT LABS AND IMAGING: CBC    Component Value Date/Time   WBC 6.7 02/06/2023 1017   WBC 7.0 02/03/2023 1945   RBC 6.01 (H) 02/06/2023 1017   HGB 13.5 02/06/2023 1017   HGB 12.9 07/30/2022 1559   HCT 43.1 02/06/2023 1017   HCT 41.9 07/30/2022 1559   PLT 338 02/06/2023 1017   PLT 394 07/30/2022 1559   MCV 71.7 (L) 02/06/2023 1017   MCV 71 (L) 07/30/2022 1559   MCH 22.5 (L) 02/06/2023 1017   MCHC 31.3 02/06/2023 1017   RDW 16.8 (H) 02/06/2023 1017   RDW 16.0 (H) 07/30/2022 1559   LYMPHSABS 1.4 02/06/2023 1017   LYMPHSABS  1.5 07/30/2022 1559   MONOABS 0.7 02/06/2023 1017   EOSABS 0.3 02/06/2023 1017   EOSABS 0.1 07/30/2022 1559   BASOSABS 0.0 02/06/2023 1017   BASOSABS 0.0 07/30/2022 1559   Recent Labs    07/30/22 1559 08/21/22 1406 11/21/22 1947 02/03/23 1945 02/06/23 1017  HGB 12.9 14.0 13.4 13.5 13.5    CMP     Component Value Date/Time   NA 138 03/26/2023 2046   K 3.6 03/26/2023 2046   CL 98  03/26/2023 2046   CO2 24 03/26/2023 2046   GLUCOSE 156 (H) 03/26/2023 2046   GLUCOSE 92 02/06/2023 1017   BUN 20 03/26/2023 2046   CREATININE 1.11 (H) 03/26/2023 2046   CREATININE 0.78 02/06/2023 1017   CREATININE 0.62 11/22/2015 1537   CALCIUM 9.7 03/26/2023 2046   PROT 7.6 02/06/2023 1017   PROT 7.4 07/30/2022 1559   ALBUMIN 3.9 02/06/2023 1017   ALBUMIN 4.0 07/30/2022 1559   AST 9 (L) 02/06/2023 1017   ALT 13 02/06/2023 1017   ALKPHOS 102 02/06/2023 1017   BILITOT 0.3 02/06/2023 1017   GFRNONAA >60 02/06/2023 1017   GFRNONAA >89 11/22/2015 1537   GFRAA 107 11/22/2020 1700   GFRAA >89 11/22/2015 1537      Latest Ref Rng & Units 02/06/2023   10:17 AM 02/03/2023    9:35 PM 07/30/2022    3:59 PM  Hepatic Function  Total Protein 6.5 - 8.1 g/dL 7.6  7.4  7.4   Albumin 3.5 - 5.0 g/dL 3.9  3.6  4.0   AST 15 - 41 U/L 9  12  12    ALT 0 - 44 U/L 13  15  15    Alk Phosphatase 38 - 126 U/L 102  94  132   Total Bilirubin 0.3 - 1.2 mg/dL 0.3  0.4  <1.6   Bilirubin, Direct 0.00 - 0.40 mg/dL   <1.09       Current Medications:   Current Outpatient Medications (Endocrine & Metabolic):    metFORMIN (GLUCOPHAGE) 500 MG tablet, Take 1 tablet (500 mg total) by mouth 2 (two) times daily with a meal.   Current Outpatient Medications (Cardiovascular):    amLODipine (NORVASC) 10 MG tablet, Take 1 tablet (10 mg total) by mouth every evening.   EPINEPHrine (EPIPEN 2-PAK) 0.3 mg/0.3 mL IJ SOAJ injection, Inject 0.3 mg into the muscle as needed for anaphylaxis.   hydrochlorothiazide (HYDRODIURIL) 25 MG tablet, Take 1 tablet (25 mg total) by mouth daily.   Current Outpatient Medications (Respiratory):    albuterol (VENTOLIN HFA) 108 (90 Base) MCG/ACT inhaler, Inhale 2 puffs into the lungs every 6 (six) hours as needed for wheezing or shortness of breath.   fexofenadine (ALLEGRA) 180 MG tablet, Take 2 tablets twice a day for hives.   XOLAIR 150 MG/ML prefilled syringe, Inject 300 mg into the skin  every 28 (twenty-eight) days.  Current Facility-Administered Medications (Respiratory):    omalizumab Geoffry Paradise) prefilled syringe 300 mg  Current Outpatient Medications (Analgesics):    acetaminophen (TYLENOL) 500 MG tablet, Take 2 tablets (1,000 mg total) by mouth every 6 (six) hours as needed.   ibuprofen (ADVIL) 600 MG tablet, Take 1 tablet (600 mg total) by mouth every 6 (six) hours as needed.   meloxicam (MOBIC) 15 MG tablet, Take 1 tablet (15 mg total) by mouth daily.   meloxicam (MOBIC) 15 MG tablet, Take 1 tablet (15 mg total) by mouth daily.   Current Outpatient Medications (Hematological):    ferrous sulfate 325 (65  FE) MG EC tablet, Take 1 tablet by mouth once every other day with a source of vitamin C.   Current Outpatient Medications (Other):    baclofen (LIORESAL) 10 MG tablet, Take 1 tablet (10 mg total) by mouth 3 (three) times daily.   cephALEXin (KEFLEX) 500 MG capsule, Take 1 capsule (500 mg total) by mouth 3 (three) times daily.   Efinaconazole 10 % SOLN, Apply 1 drop topically daily.   famotidine (PEPCID) 20 MG tablet, Take 1 tablet (20 mg total) by mouth 2 (two) times daily.   lamoTRIgine (LAMICTAL) 100 MG tablet, Take 1 tablet (100 mg total) by mouth daily.   methocarbamol (ROBAXIN) 500 MG tablet, Take 1 tablet (500 mg total) by mouth 2 (two) times daily.   traZODone (DESYREL) 100 MG tablet, Take 1 tablet (100 mg total) by mouth at bedtime as needed for sleep. Take an additional 50 mg (1/2 tab) as needed   triamcinolone ointment (KENALOG) 0.1 %, Apply 1 Application topically 2 (two) times daily as needed (rash flare). Do not use on the face, neck, armpits or groin area. Do not use more than 3 weeks in a row.   Medical History:  Past Medical History:  Diagnosis Date   Abnormal Pap smear of cervix    Anxiety    Depression    Dysmenorrhea    Eczema    History of COVID-19 10/24/2020   loss of taste and smellbody aches and cough x 4 days all symptoms resolved    History of kidney stones    Hypertension    Pre-diabetes    PTSD (post-traumatic stress disorder)    Rash 08/31/2021   comes and goes on legs saw dermetology no rash now ? food allergy   SUI (stress urinary incontinence, female)    Trichomonas infection 02/2021   Uterine fibroid    Allergies:  Allergies  Allergen Reactions   Pork-Derived Products    Tramadol     rash   Tramadol Other (See Comments)    Headache     Surgical History:  She  has a past surgical history that includes Cesarean section (1997); Tubal ligation; Dilatation & currettage/hysteroscopy with hydrothermal ablation (N/A, 06/22/2016); Cholecystectomy (N/A, 01/14/2019); Total laparoscopic hysterectomy with salpingectomy (Bilateral, 09/11/2021); Cystoscopy (N/A, 09/11/2021); Bladder suspension (N/A, 09/11/2021); and Cystoscopy/ureteroscopy/holmium laser (Left, 01/31/2022). Family History:  Her family history includes Allergic rhinitis in her daughter, daughter, son, and son; Diabetes in her maternal grandfather; Hypertension in her maternal grandmother.  REVIEW OF SYSTEMS  : All other systems reviewed and negative except where noted in the History of Present Illness.  PHYSICAL EXAM: BP 124/80   Pulse (!) 106   Ht 5\' 1"  (1.549 m)   Wt 198 lb 6.4 oz (90 kg)   LMP  (LMP Unknown)   SpO2 97%   BMI 37.49 kg/m  General Appearance: Well nourished, in no apparent distress. Head:   Normocephalic and atraumatic. Eyes:  sclerae anicteric,conjunctive pink  Respiratory: Respiratory effort normal, BS equal bilaterally without rales, rhonchi, wheezing. Cardio: RRR with no MRGs. Peripheral pulses intact.  Abdomen: Soft,  Obese ,active bowel sounds. No tenderness . Without guarding and Without rebound. No masses. Rectal: Normal external rectal exam, normal rectal tone, appreciated internal hemorrhoids, non-tender, no masses, , brown stool, hemoccult Negative Musculoskeletal: Full ROM, Normal gait. Without edema. Skin:  Dry and  intact without significant lesions or rashes Neuro: Alert and  oriented x4;  No focal deficits. Psych:  Cooperative. Normal mood and affect.  Doree Albee, PA-C 11:03 AM

## 2023-05-16 ENCOUNTER — Ambulatory Visit (INDEPENDENT_AMBULATORY_CARE_PROVIDER_SITE_OTHER): Payer: BC Managed Care – PPO | Admitting: Physician Assistant

## 2023-05-16 ENCOUNTER — Encounter: Payer: Self-pay | Admitting: Gastroenterology

## 2023-05-16 ENCOUNTER — Other Ambulatory Visit: Payer: BC Managed Care – PPO

## 2023-05-16 ENCOUNTER — Encounter: Payer: Self-pay | Admitting: Physician Assistant

## 2023-05-16 VITALS — BP 124/80 | HR 106 | Ht 61.0 in | Wt 198.4 lb

## 2023-05-16 DIAGNOSIS — K648 Other hemorrhoids: Secondary | ICD-10-CM | POA: Diagnosis not present

## 2023-05-16 DIAGNOSIS — D649 Anemia, unspecified: Secondary | ICD-10-CM

## 2023-05-16 DIAGNOSIS — K5900 Constipation, unspecified: Secondary | ICD-10-CM

## 2023-05-16 DIAGNOSIS — K5730 Diverticulosis of large intestine without perforation or abscess without bleeding: Secondary | ICD-10-CM | POA: Diagnosis not present

## 2023-05-16 DIAGNOSIS — Z1231 Encounter for screening mammogram for malignant neoplasm of breast: Secondary | ICD-10-CM | POA: Diagnosis not present

## 2023-05-16 LAB — IBC + FERRITIN
Ferritin: 50.8 ng/mL (ref 10.0–291.0)
Iron: 76 ug/dL (ref 42–145)
Saturation Ratios: 16.8 % — ABNORMAL LOW (ref 20.0–50.0)
TIBC: 452.2 ug/dL — ABNORMAL HIGH (ref 250.0–450.0)
Transferrin: 323 mg/dL (ref 212.0–360.0)

## 2023-05-16 NOTE — Patient Instructions (Addendum)
Your provider has requested that you go to the basement level for lab work before leaving today. Press "B" on the elevator. The lab is located at the first door on the left as you exit the elevator.   Please do sitz baths- these can be found at the pharmacy. It is a Chief Operating Officer that is put in your toliet.  Please increase fiber or add benefiber, increase water and increase acitivity.  Will send in hydrocoritsone suppository, cheapest with GOODRX from [employer], [employer], [employer] or [employer] if your insurance does not pay for it. If the hemorrhoid suppository sent in is too expensive you can do this over the counter trick.  Apply a pea size amount of over the counter Anusol HC cream to the tip of an over the counter PrepH suppository and insert rectally once every night for at least 7 nights.  If this does not improve there are procedures that can be done.   About Hemorrhoids  Hemorrhoids are swollen veins in the lower rectum and anus.  Also called piles, hemorrhoids are a common problem.  Hemorrhoids may be internal (inside the rectum) or external (around the anus).  Internal Hemorrhoids  Internal hemorrhoids are often painless, but they rarely cause bleeding.  The internal veins may stretch and fall down (prolapse) through the anus to the outside of the body.  The veins may then become irritated and painful.  External Hemorrhoids  External hemorrhoids can be easily seen or felt around the anal opening.  They are under the skin around the anus.  When the swollen veins are scratched or broken by straining, rubbing or wiping they sometimes bleed.  How Hemorrhoids Occur  Veins in the rectum and around the anus tend to swell under pressure.  Hemorrhoids can result from increased pressure in the veins of your anus or rectum.  Some sources of pressure are:  Straining to have a bowel movement because of constipation Waiting too long to have a bowel movement Coughing and sneezing  often Sitting for extended periods of time, including on the toilet Diarrhea Obesity Trauma or injury to the anus Some liver diseases Stress Family history of hemorrhoids Pregnancy  Pregnant women should try to avoid becoming constipated, because they are more likely to have hemorrhoids during pregnancy.  In the last trimester of pregnancy, the enlarged uterus may press on blood vessels and causes hemorrhoids.  In addition, the strain of childbirth sometimes causes hemorrhoids after the birth.  Symptoms of Hemorrhoids  Some symptoms of hemorrhoids include: Swelling and/or a tender lump around the anus Itching, mild burning and bleeding around the anus Painful bowel movements with or without constipation Bright red blood covering the stool, on toilet paper or in the toilet bowel.   Symptoms usually go away within a few days.  Always talk to your doctor about any bleeding to make sure it is not from some other causes.  Diagnosing and Treating Hemorrhoids  Diagnosis is made by an examination by your healthcare provider.  Special test can be performed by your doctor.    Most cases of hemorrhoids can be treated with: High-fiber diet: Eat more high-fiber foods, which help prevent constipation.  Ask for more detailed fiber information on types and sources of fiber from your healthcare provider. Fluids: Drink plenty of water.  This helps soften bowel movements so they are easier to pass. Sitz baths and cold packs: Sitting in lukewarm water two or three times a day for 15 minutes cleases the anal area  and may relieve discomfort.  If the water is too hot, swelling around the anus will get worse.  Placing a cloth-covered ice pack on the anus for ten minutes four times a day can also help reduce selling.  Gently pushing a prolapsed hemorrhoid back inside after the bath or ice pack can be helpful. Medications: For mild discomfort, your healthcare provider may suggest over-the-counter pain medication  or prescribe a cream or ointment for topical use.  The cream may contain witch hazel, zinc oxide or petroleum jelly.  Medicated suppositories are also a treatment option.  Always consult your doctor before applying medications or creams. Procedures and surgeries: There are also a number of procedures and surgeries to shrink or remove hemorrhoids in more serious cases.  Talk to your physician about these options.  You can often prevent hemorrhoids or keep them from becoming worse by maintaining a healthy lifestyle.  Eat a fiber-rich diet of fruits, vegetables and whole grains.  Also, drink plenty of water and exercise regularly.   2007, [employer] Doc.30   Diverticulosis Diverticulosis is a condition that develops when small pouches (diverticula) form in the wall of the large intestine (colon). The colon is where water is absorbed and stool (feces) is formed. The pouches form when the inside layer of the colon pushes through weak spots in the outer layers of the colon. You may have a few pouches or many of them. The pouches usually do not cause problems unless they become inflamed or infected. When this happens, the condition is called diverticulitis- this is left lower quadrant pain, diarrhea, fever, chills, nausea or vomiting.  If this occurs please call the office or go to the hospital. Sometimes these patches without inflammation can also have painless bleeding associated with them, if this happens please call the office or go to the hospital. Preventing constipation and increasing fiber can help reduce diverticula and prevent complications. Even if you feel you have a high-fiber diet, suggest getting on Benefiber or Cirtracel 2 times daily.  _______________________________________________________  If your blood pressure at your visit was 140/90 or greater, please contact your primary care physician to follow up on  this.  _______________________________________________________  If you are age 37 or older, your body mass index should be between 23-30. Your Body mass index is 37.49 kg/m. If this is out of the aforementioned range listed, please consider follow up with your Primary Care Provider.  If you are age 34 or younger, your body mass index should be between 19-25. Your Body mass index is 37.49 kg/m. If this is out of the aformentioned range listed, please consider follow up with your Primary Care Provider.   ________________________________________________________  The [facility] providers would like to encourage you to use [facility] to communicate with providers for non-urgent requests or questions.  Due to long hold times on the telephone, sending your provider a message by [facility] may be a faster and more efficient way to get a response.  Please allow 48 business hours for a response.  Please remember that this is for non-urgent requests.  _______________________________________________________ It was a pleasure to see you today!  Thank you for trusting me with your gastrointestinal care!

## 2023-05-22 ENCOUNTER — Ambulatory Visit: Payer: BC Managed Care – PPO

## 2023-05-29 ENCOUNTER — Ambulatory Visit: Payer: BC Managed Care – PPO

## 2023-05-30 ENCOUNTER — Ambulatory Visit (INDEPENDENT_AMBULATORY_CARE_PROVIDER_SITE_OTHER): Payer: BC Managed Care – PPO

## 2023-05-30 DIAGNOSIS — L501 Idiopathic urticaria: Secondary | ICD-10-CM | POA: Diagnosis not present

## 2023-06-02 ENCOUNTER — Other Ambulatory Visit: Payer: Self-pay | Admitting: Family

## 2023-06-02 DIAGNOSIS — I10 Essential (primary) hypertension: Secondary | ICD-10-CM

## 2023-06-02 MED ORDER — AMLODIPINE BESYLATE 10 MG PO TABS
10.0000 mg | ORAL_TABLET | Freq: Every evening | ORAL | 0 refills | Status: DC
Start: 2023-06-02 — End: 2023-07-08

## 2023-06-02 MED ORDER — HYDROCHLOROTHIAZIDE 25 MG PO TABS
25.0000 mg | ORAL_TABLET | Freq: Every day | ORAL | 0 refills | Status: DC
Start: 2023-06-02 — End: 2023-07-08

## 2023-06-14 ENCOUNTER — Ambulatory Visit (AMBULATORY_SURGERY_CENTER): Payer: BC Managed Care – PPO

## 2023-06-14 VITALS — Ht 61.0 in | Wt 189.0 lb

## 2023-06-14 DIAGNOSIS — D649 Anemia, unspecified: Secondary | ICD-10-CM

## 2023-06-14 NOTE — Progress Notes (Signed)
No egg or soy allergy known to patient  No issues known to pt with past sedation with any surgeries or procedures Patient denies ever being told they had issues or difficulty with intubation  No FH of Malignant Hyperthermia Pt is not on diet pills Pt is not on  home 02  Pt is not on blood thinners  Pt denies issues with constipation takes miralax No A fib or A flutter Have any cardiac testing pending--no Pt can ambulate independently Pt denies use of chewing tobacco Discussed diabetic I weight loss medication holds Discussed NSAID holds Checked BMI Pt instructed to use [employer] or GoodRx for a price reduction on prep  Patient's chart reviewed by Cathlyn Parsons CNRA prior to previsit and patient appropriate for the [facility].  Pre visit completed and red dot placed by patient's name on their procedure day (on provider's schedule).

## 2023-06-20 ENCOUNTER — Encounter: Payer: Self-pay | Admitting: Gastroenterology

## 2023-06-21 DIAGNOSIS — G4733 Obstructive sleep apnea (adult) (pediatric): Secondary | ICD-10-CM | POA: Diagnosis not present

## 2023-06-23 NOTE — Progress Notes (Unsigned)
Follow Up Note  RE: Brianna Gutierrez MRN: [id number] DOB: [birth year] Date of Office Visit: 06/24/2023  Referring provider: [name], NP Primary care provider: [name], NP  Chief Complaint: Urticaria (No issues ) and Pruritus (Itching due to sweat )  History of Present Illness: I had the pleasure of seeing Brianna Gutierrez for a follow up visit at the [facility] on 06/24/2023. She is a 47 y.o. female, who is being followed for CIU on Xolair, shortness of breath, allergic rhinitis, adverse food reaction. Her previous allergy office visit was on 05/08/2023 with Dr. Selena Batten. Today is a regular follow up visit.  Chronic idiopathic urticaria No hives but gets itchy at times. Currently on Xolair 300mg  every 5 weeks with no issues.  Taking famotidine 20mg  BID and allegra 180mg  once a day with good benefit. The famotidine helps with the GERD.   Shortness of breath Some dyspnea on exertion and uses albuterol prn with good benefit.  Other allergic rhinitis Some sneezing and watery eyes.    Food Avoiding red meat but would like to reintroduce.   Assessment and Plan: Brianna Gutierrez is a 47 y.o. female with: Chronic idiopathic urticaria Past history - Pruritic rash for 1 year lasting 1 day at a time. No triggers noted. Occurs a few times per month. Concerned whether strawberries, bananas and seafood may be contributing to the symptoms. Saw dermatology virtually in the past with no benefit.  Interim history - no hives but has some itching. Tolerating Xolair.  Continue proper skin care. Keep track of flares. Continue Xolair 300mg  every 5 weeks.  If you have symptoms/breakouts then let us know.  During hive flare start the following medications until symptoms subside. Allegra 180mg  1-2 times per day. Famotidine 20mg  twice a day Avoid the following potential triggers: alcohol, tight clothing, NSAIDs, hot showers and getting overheated.  Dyspnea on  exertion Past history - Dyspnea on exertion and uses albuterol as needed with good benefit. Has been using it daily since prescribed 1 month ago. 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 CT chest in April 2023 was unremarkable. 2023 spirometry showed restriction with 2% improvement FEV1 postbronchodilator treatment. Clinically feeling improved  Today's spirometry was normal. May use albuterol rescue inhaler 2 puffs every 4 to 6 hours as needed for shortness of breath, chest tightness, coughing, and wheezing. May use albuterol rescue inhaler 2 puffs 5 to 15 minutes prior to strenuous physical activities. Monitor frequency of use - if you need to use it more than twice per week on a consistent basis let us know.   Other adverse food reactions, not elsewhere classified, subsequent encounter Past history - 2023 bloodwork positive to pork and lamb. Tolerates bananas and strawberries.  Get bloodwork and if negative will discuss reintroduction.  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  Interim history - some symptoms but doesn't like nasal sprays. Continue environmental control measures.  Start Singulair (montelukast) 10mg  daily at night. Cautioned that in some children/adults can experience behavioral changes including hyperactivity, agitation, depression, sleep disturbances and suicidal ideations. These side effects are rare, but if you notice them you should notify me and discontinue Singulair (montelukast).  GERD Continue famotidine 20mg  BID as above.  Return in about 6 months (around 12/25/2023).  Meds ordered this encounter  Medications   montelukast (SINGULAIR) 10 MG tablet  Sig: Take 1 tablet (10 mg total) by mouth at bedtime.    Dispense:  30 tablet    Refill:  5   Lab Orders         Alpha-Gal Panel      Diagnostics: Spirometry:  Tracings reviewed. Her effort:  Good reproducible efforts. FVC: 2.13L FEV1: 1.81L, 81% predicted FEV1/FVC ratio: 85% Interpretation: Spirometry consistent with normal pattern.  Please see scanned spirometry results for details.  Medication List:  Current Outpatient Medications  Medication Sig Dispense Refill   albuterol (VENTOLIN HFA) 108 (90 Base) MCG/ACT inhaler Inhale 2 puffs into the lungs every 6 (six) hours as needed for wheezing or shortness of breath. 8 g 2   amLODipine (NORVASC) 10 MG tablet Take 1 tablet (10 mg total) by mouth every evening. 90 tablet 0   baclofen (LIORESAL) 10 MG tablet Take 1 tablet (10 mg total) by mouth 3 (three) times daily. 30 each 0   cephALEXin (KEFLEX) 500 MG capsule Take 1 capsule (500 mg total) by mouth 3 (three) times daily. 21 capsule 0   EPINEPHrine (EPIPEN 2-PAK) 0.3 mg/0.3 mL IJ SOAJ injection Inject 0.3 mg into the muscle as needed for anaphylaxis. 2 each 2   famotidine (PEPCID) 20 MG tablet Take 1 tablet (20 mg total) by mouth 2 (two) times daily. 60 tablet 3   ferrous sulfate 325 (65 FE) MG EC tablet Take 1 tablet by mouth once every other day with a source of vitamin C. 30 tablet 6   fexofenadine (ALLEGRA) 180 MG tablet Take 2 tablets twice a day for hives. 120 tablet 2   gabapentin (NEURONTIN) 300 MG capsule Take 1 capsule by mouth at bedtime.     hydrochlorothiazide (HYDRODIURIL) 25 MG tablet Take 1 tablet (25 mg total) by mouth daily. 90 tablet 0   lamoTRIgine (LAMICTAL) 100 MG tablet Take 1 tablet (100 mg total) by mouth daily. 90 tablet 0   meloxicam (MOBIC) 15 MG tablet Take 1 tablet (15 mg total) by mouth daily. 30 tablet 0   methocarbamol (ROBAXIN) 500 MG tablet Take 1 tablet (500 mg total) by mouth 2 (two) times daily. 20 tablet 0   montelukast (SINGULAIR) 10 MG tablet Take 1 tablet (10 mg total) by mouth at bedtime. 30 tablet 5   XOLAIR 150 MG/ML prefilled syringe Inject 300 mg into the skin every 28 (twenty-eight) days.     metFORMIN (GLUCOPHAGE) 500 MG tablet Take  1 tablet (500 mg total) by mouth 2 (two) times daily with a meal. 180 tablet 0   Current Facility-Administered Medications  Medication Dose Route Frequency Provider Last Rate Last Admin   omalizumab Geoffry Paradise) prefilled syringe 300 mg  300 mg Subcutaneous Q28 days Nehemiah Settle, FNP   300 mg at 05/30/23 1810   Allergies: Allergies  Allergen Reactions   Pork-Derived Products    Tramadol     rash   Tramadol Other (See Comments)    Headache   I reviewed her past medical history, social history, family history, and environmental history and no significant changes have been reported from her previous visit.  Review of Systems  Constitutional:  Negative for appetite change, chills, fever and unexpected weight change.  HENT:  Negative for congestion and rhinorrhea.   Eyes:  Negative for itching.  Respiratory:  Negative for cough, chest tightness, shortness of breath and wheezing.   Cardiovascular:  Negative for chest pain.  Gastrointestinal:  Negative for abdominal pain.  Genitourinary:  Negative for difficulty urinating.  Skin:  Negative for rash.  Allergic/Immunologic: Positive for environmental allergies.  Neurological:  Negative for headaches.    Objective: BP 130/88   Pulse (!) 113   Temp 98.2 F (36.8 C)   Resp 18   Ht 5' 1.81" (1.57 m)   Wt 195 lb 3.2 oz (88.5 kg)   LMP  (LMP Unknown)   SpO2 96%   BMI 35.92 kg/m  Body mass index is 35.92 kg/m. Physical Exam Vitals and nursing note reviewed.  Constitutional:      Appearance: Normal appearance. She is well-developed. She is obese.  HENT:     Head: Normocephalic and atraumatic.     Right Ear: Tympanic membrane and external ear normal.     Left Ear: Tympanic membrane and external ear normal.     Nose: Nose normal.     Mouth/Throat:     Mouth: Mucous membranes are moist.     Pharynx: Oropharynx is clear.  Eyes:     Conjunctiva/sclera: Conjunctivae normal.  Cardiovascular:     Rate and Rhythm: Normal rate and  regular rhythm.     Heart sounds: Normal heart sounds. No murmur heard.    No friction rub. No gallop.  Pulmonary:     Effort: Pulmonary effort is normal.     Breath sounds: Normal breath sounds. No wheezing, rhonchi or rales.  Musculoskeletal:     Cervical back: Neck supple.  Skin:    General: Skin is warm.     Findings: No rash.  Neurological:     Mental Status: She is alert and oriented to person, place, and time.  Psychiatric:        Behavior: Behavior normal.   Previous notes and tests were reviewed. The plan was reviewed with the patient/family, and all questions/concerned were addressed.  It was my pleasure to see Brianna Gutierrez today and participate in her care. Please feel free to contact me with any questions or concerns.  Sincerely,  Wyline Mood, DO Allergy & Immunology  [facility] office: [phone] [facility] office: [phone]

## 2023-06-24 ENCOUNTER — Encounter: Payer: Self-pay | Admitting: Allergy

## 2023-06-24 ENCOUNTER — Ambulatory Visit (INDEPENDENT_AMBULATORY_CARE_PROVIDER_SITE_OTHER): Payer: BC Managed Care – PPO | Admitting: Allergy

## 2023-06-24 ENCOUNTER — Other Ambulatory Visit: Payer: Self-pay

## 2023-06-24 VITALS — BP 130/88 | HR 113 | Temp 98.2°F | Resp 18 | Ht 61.81 in | Wt 195.2 lb

## 2023-06-24 DIAGNOSIS — R0609 Other forms of dyspnea: Secondary | ICD-10-CM

## 2023-06-24 DIAGNOSIS — T781XXD Other adverse food reactions, not elsewhere classified, subsequent encounter: Secondary | ICD-10-CM | POA: Diagnosis not present

## 2023-06-24 DIAGNOSIS — J3089 Other allergic rhinitis: Secondary | ICD-10-CM

## 2023-06-24 DIAGNOSIS — L501 Idiopathic urticaria: Secondary | ICD-10-CM | POA: Diagnosis not present

## 2023-06-24 DIAGNOSIS — K219 Gastro-esophageal reflux disease without esophagitis: Secondary | ICD-10-CM

## 2023-06-24 DIAGNOSIS — R0602 Shortness of breath: Secondary | ICD-10-CM

## 2023-06-24 DIAGNOSIS — J3081 Allergic rhinitis due to animal (cat) (dog) hair and dander: Secondary | ICD-10-CM

## 2023-06-24 MED ORDER — MONTELUKAST SODIUM 10 MG PO TABS: 10.0000 mg | ORAL_TABLET | Freq: Every day | ORAL | 5 refills | Status: DC

## 2023-06-24 NOTE — Patient Instructions (Addendum)
Skin: Continue proper skin care. Keep track of flares. Continue to avoid pork, lamb and beef jerky. Get bloodwork   During hive flare start the following medications until symptoms subside. Allegra 180mg  1-2 times per day. Famotidine 20mg  twice a day  Avoid the following potential triggers: alcohol, tight clothing, NSAIDs, hot showers and getting overheated. Xolair 300mg  every 5 weeks.  If you have symptoms/breakouts then let us know.   Environmental allergies 2023 skin testing showed: Positive to mold and dog.  Continue environmental control measures.  Start Singulair (montelukast) 10mg  daily at night. Cautioned that in some children/adults can experience behavioral changes including hyperactivity, agitation, depression, sleep disturbances and suicidal ideations. These side effects are rare, but if you notice them you should notify me and discontinue Singulair (montelukast).  Breathing May use albuterol rescue inhaler 2 puffs every 4 to 6 hours as needed for shortness of breath, chest tightness, coughing, and wheezing. May use albuterol rescue inhaler 2 puffs 5 to 15 minutes prior to strenuous physical activities. Monitor frequency of use - if you need to use it more than twice per week on a consistent basis let us know.   Follow up in 6 months or sooner if needed.   Skin care recommendations  Bath time: Always use lukewarm water. AVOID very hot or cold water. Keep bathing time to 5-10 minutes. Do NOT use bubble bath. Use a mild soap and use just enough to wash the dirty areas. Do NOT scrub skin vigorously.  After bathing, pat dry your skin with a towel. Do NOT rub or scrub the skin.  Moisturizers and prescriptions:  ALWAYS apply moisturizers immediately after bathing (within 3 minutes). This helps to lock-in moisture. Use the moisturizer several times a day over the whole body. Good summer moisturizers include: Aveeno, CeraVe, Cetaphil. Good winter moisturizers include:  Aquaphor, Vaseline, Cerave, Cetaphil, Eucerin, Vanicream. When using moisturizers along with medications, the moisturizer should be applied about one hour after applying the medication to prevent diluting effect of the medication or moisturize around where you applied the medications. When not using medications, the moisturizer can be continued twice daily as maintenance.  Laundry and clothing: Avoid laundry products with added color or perfumes. Use unscented hypo-allergenic laundry products such as Tide free, Cheer free & gentle, and All free and clear.  If the skin still seems dry or sensitive, you can try double-rinsing the clothes. Avoid tight or scratchy clothing such as wool. Do not use fabric softeners or dyer sheets.   Mold Control Mold and fungi can grow on a variety of surfaces provided certain temperature and moisture conditions exist.  Outdoor molds grow on plants, decaying vegetation and soil. The major outdoor mold, Alternaria and Cladosporium, are found in very high numbers during hot and dry conditions. Generally, a late summer - fall peak is seen for common outdoor fungal spores. Rain will temporarily lower outdoor mold spore count, but counts rise rapidly when the rainy period ends. The most important indoor molds are Aspergillus and Penicillium. Dark, humid and poorly ventilated basements are ideal sites for mold growth. The next most common sites of mold growth are the bathroom and the kitchen. Outdoor (Seasonal) Mold Control Use air conditioning and keep windows closed. Avoid exposure to decaying vegetation. Avoid leaf raking. Avoid grain handling. Consider wearing a face mask if working in moldy areas.  Indoor (Perennial) Mold Control  Maintain humidity below 50%. Get rid of mold growth on hard surfaces with water, detergent and, if necessary, 5% bleach (  do not mix with other cleaners). Then dry the area completely. If mold covers an area more than 10 square feet,  consider hiring an indoor environmental professional. For clothing, washing with soap and water is best. If moldy items cannot be cleaned and dried, throw them away. Remove sources e.g. contaminated carpets. Repair and seal leaking roofs or pipes. Using dehumidifiers in damp basements may be helpful, but empty the water and clean units regularly to prevent mildew from forming. All rooms, especially basements, bathrooms and kitchens, require ventilation and cleaning to deter mold and mildew growth. Avoid carpeting on concrete or damp floors, and storing items in damp areas. Pet Allergen Avoidance: Contrary to popular opinion, there are no "hypoallergenic" breeds of dogs or cats. That is because people are not allergic to an animal's hair, but to an allergen found in the animal's saliva, dander (dead skin flakes) or urine. Pet allergy symptoms typically occur within minutes. For some people, symptoms can build up and become most severe 8 to 12 hours after contact with the animal. People with severe allergies can experience reactions in public places if dander has been transported on the pet owners' clothing. Keeping an animal outdoors is only a partial solution, since homes with pets in the yard still have higher concentrations of animal allergens. Before getting a pet, ask your allergist to determine if you are allergic to animals. If your pet is already considered part of your family, try to minimize contact and keep the pet out of the bedroom and other rooms where you spend a great deal of time. As with dust mites, vacuum carpets often or replace carpet with a hardwood floor, tile or linoleum. High-efficiency particulate air (HEPA) cleaners can reduce allergen levels over time. While dander and saliva are the source of cat and dog allergens, urine is the source of allergens from rabbits, hamsters, mice and [place] pigs; so ask a non-allergic family member to clean the animal's cage. If you have a pet  allergy, talk to your allergist about the potential for allergy immunotherapy (allergy shots). This strategy can often provide long-term relief.

## 2023-06-25 IMAGING — CT CT ABD-PELV W/ CM
2 of 5 series · 14 of 46 positions shown, 16 images · IV contrast (APPLIED)
Comparison: CT abdomen/pelvis 09/24/2021

CLINICAL DATA: Postop day fifth from hysterectomy with bilateral
salpingectomy, elevated white count and pain, possible abscess seen
on CT abdomen/pelvis 09/24/2021

EXAM:
CT ABDOMEN AND PELVIS WITH CONTRAST
TECHNIQUE: Multidetector CT imaging of the abdomen and pelvis was performed
using the standard protocol following bolus administration of
intravenous contrast.
CONTRAST:  80mL OMNIPAQUE IOHEXOL 350 MG/ML SOLN

[Series 2: axial st · axial · 0.90mm/px · z∈[-336,+64]mm · 11 of 96 slices shown, 13 images]
[im 8/96  soft-tissue]
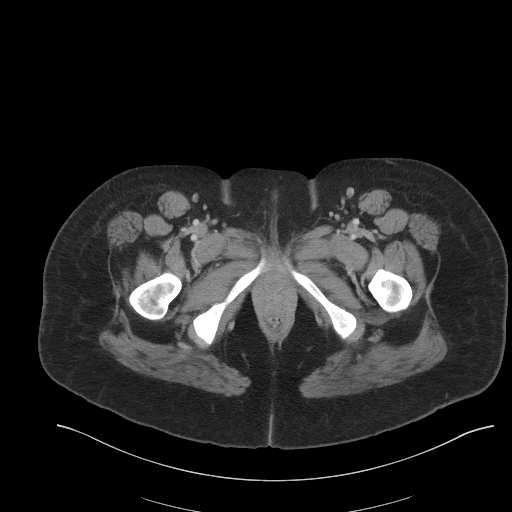
[im 8/96  bone]
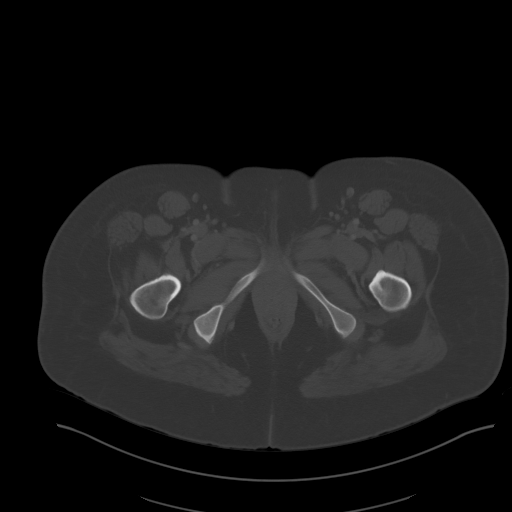
[im 16/96  soft-tissue]
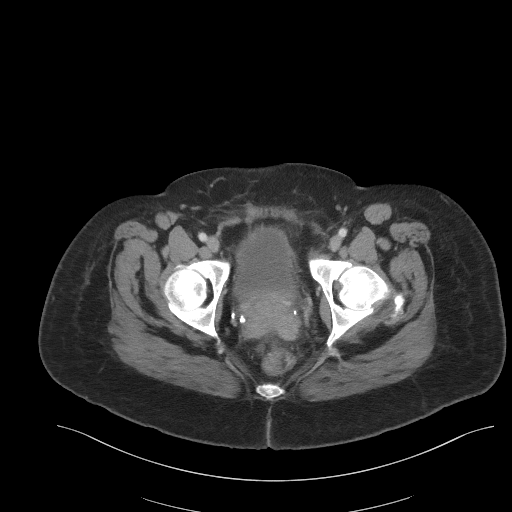
[im 24/96  soft-tissue]
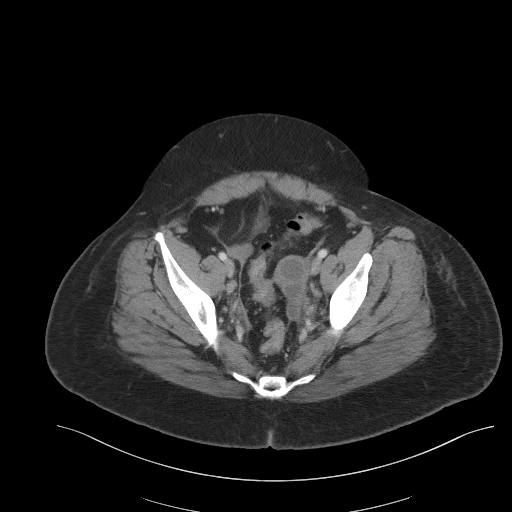
[im 32/96  soft-tissue]
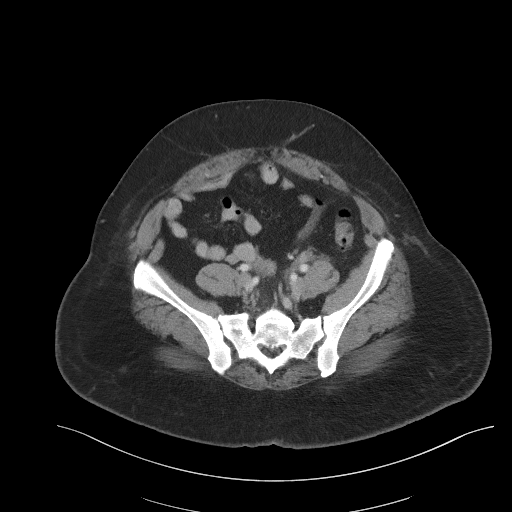
[im 40/96  soft-tissue]
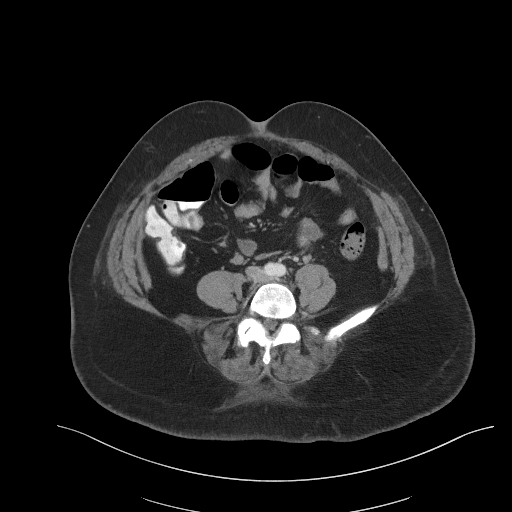
[im 48/96  soft-tissue]
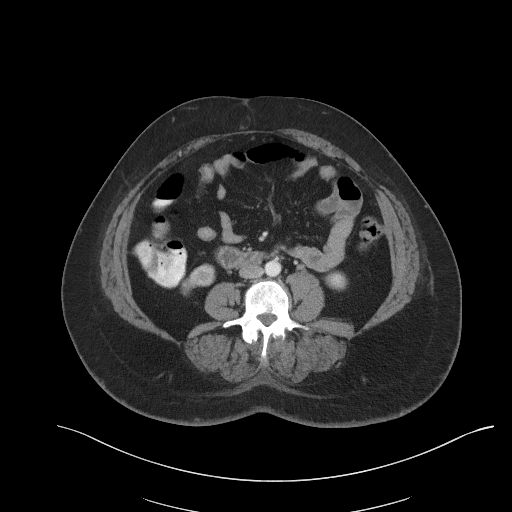
[im 56/96  soft-tissue]
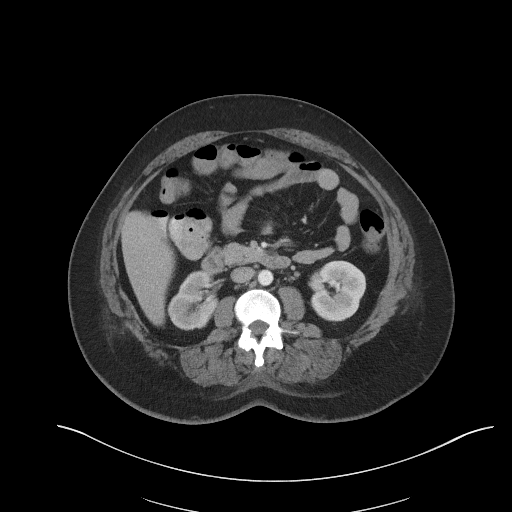
[im 64/96  soft-tissue]
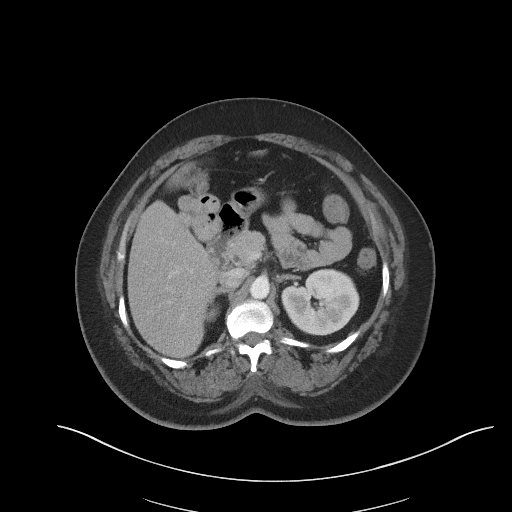
[im 72/96  soft-tissue]
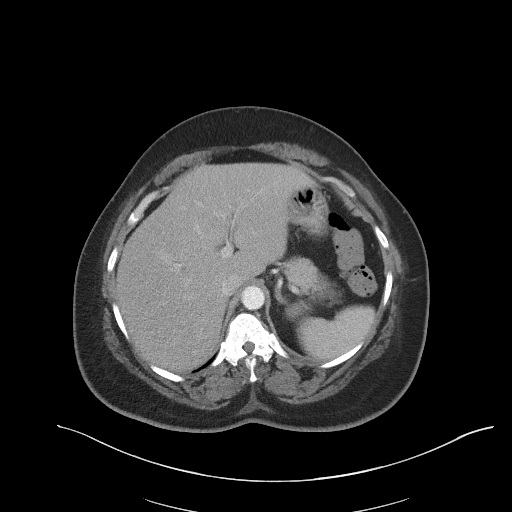
[im 72/96  bone]
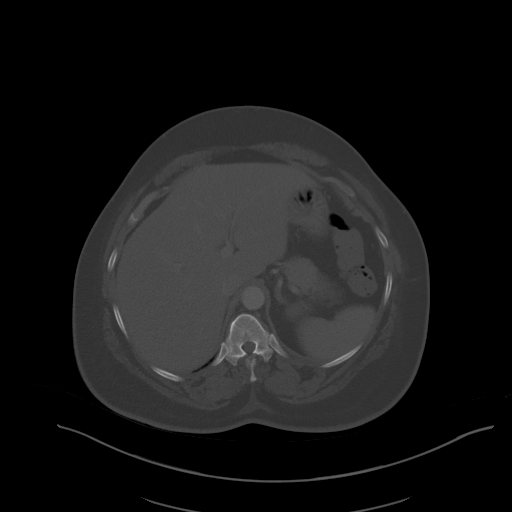
[im 80/96  soft-tissue]
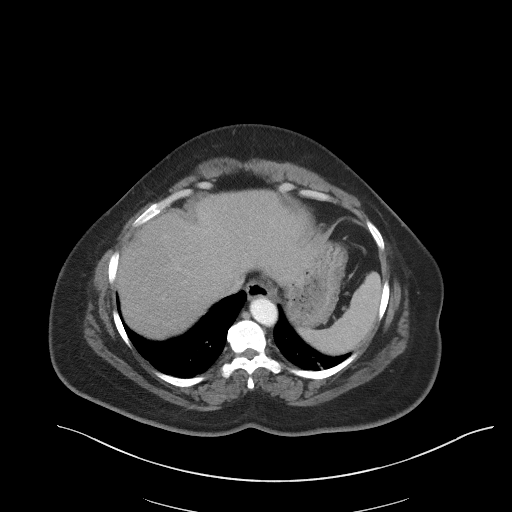
[im 88/96  soft-tissue]
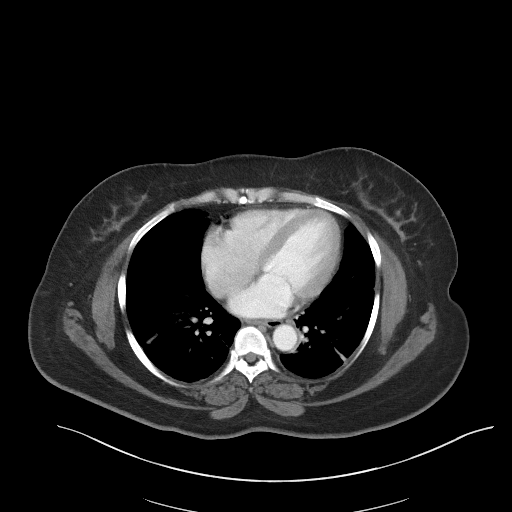

[Series 6: coronal st · coronal · 0.68mm/px · 3 of 100 slices shown]
[im 34/100  soft-tissue]
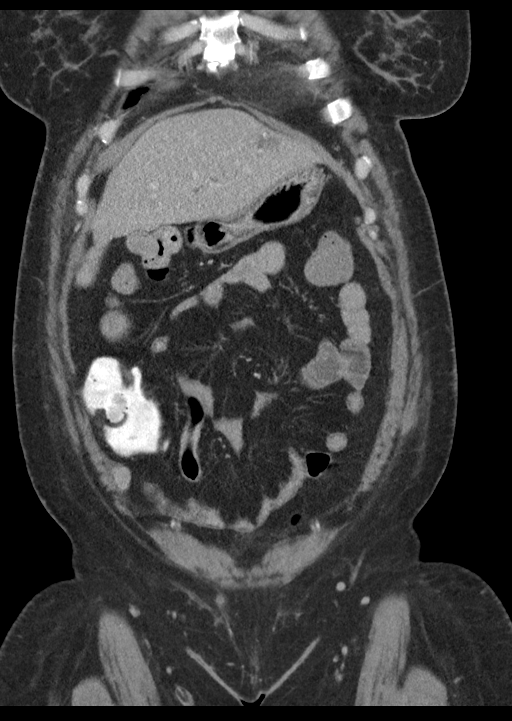
[im 45/100  soft-tissue]
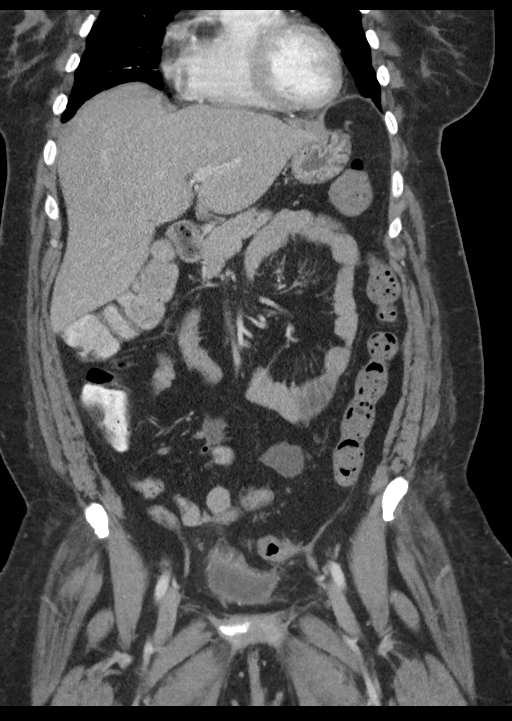
[im 56/100  soft-tissue]
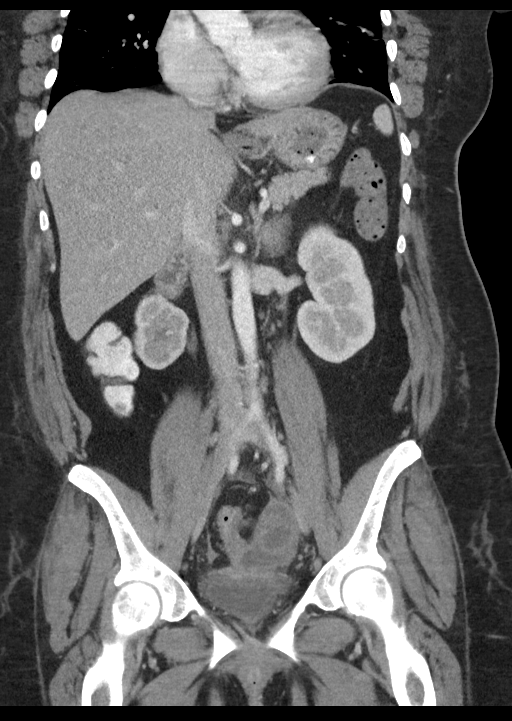

[14 of 46 positions shown; findings below may reference images not displayed]

FINDINGS: Lower chest: Acute pulmonary emboli are seen in right lower lobe
segmental and subsegmental arteries. Linear opacities in the lung
bases likely reflect atelectasis. There is flattening of the
interventricular septum.

Hepatobiliary: There is an ill-defined hypodense lesion in the left
hepatic lobe measuring up to approximately 1.5 cm. No other focal
lesions are seen. The gallbladder is surgically absent. There is no
biliary ductal dilatation.

Pancreas: Unremarkable.

Spleen: Unremarkable.

Adrenals/Urinary Tract: The adrenals are unremarkable.

The right kidney is smaller than the left. The right kidney is
slightly hypoenhancing compared to the left on the early phase
images, but there is excretion of contrast into the collecting
systems bilaterally on the delayed phase images. A hypodense lesion
in the right upper pole most likely reflects a small cyst. There is
a 5 mm nonobstructing left renal stone, unchanged. There are no
other focal lesions or stones. There is no hydronephrosis or
hydroureter. The bladder is decompressed and not well assessed.

Stomach/Bowel: The stomach is unremarkable. There is no evidence of
bowel obstruction. There is no abnormal bowel wall thickening or
inflammatory change. Appendix is normal.

Vascular/Lymphatic: The abdominal aorta is normal in course and
caliber. The major branch vessels are patent. The main portal and
splenic veins are patent. There is no abdominal or pelvic
lymphadenopathy.

Reproductive: The patient is status post hysterectomy and bilateral
salpingectomy. The right ovary is identified and appears normal.
There is a complex, multiloculated fluid collection in the surgical
bed measuring up to approximally 4.8 cm AP by 4.1 cm TV by 4.6 cm
cc. A rounded hypodensity adjacent to the superolateral aspect of
this collection measuring approximally 2.2 cm may reflect the left
ovary surrounded by the collection (2-73). A separate loculated
portion of the collection measuring 2.4 cm x 1.7 cm by 2.0 cm is
noted more posteriorly and inferiorly in the pelvis with incomplete
peripheral enhancement (6-75). This collection abuts the inferior
margin of the sigmoid colon which may be adhered (6-59).

Other: There is trace free fluid in the left hemipelvis adjacent to
the above-described collection. There is no free intraperitoneal
air.

Musculoskeletal: There is no acute osseous abnormality or aggressive
osseous lesion.
IMPRESSION: 1. Acute pulmonary emboli in right lower lobe segmental and
subsegmental arteries with flattening of the interventricular septum
raising suspicion for a degree of right heart strain.
2. Complex multiloculated collection in the pelvis is concerning for
developing abscess following recent hysterectomy. An ovoid
hypodensity in the left hemipelvis adjacent to the collection may
reflect the left ovary surrounded by abscess.
3. Unchanged indeterminate lesion in the left hepatic lobe.
Nonemergent liver MRI with and without contrast is recommended for
characterization.
4. Nonobstructing left renal stone.

These results were called by telephone at the time of interpretation
on 09/27/2021 at [DATE] to provider Dr Zeenat Uriel, who verbally
acknowledged these results.

## 2023-06-26 ENCOUNTER — Ambulatory Visit: Payer: BC Managed Care – PPO | Admitting: Family

## 2023-06-27 ENCOUNTER — Encounter: Payer: Self-pay | Admitting: Allergy

## 2023-06-28 LAB — ALPHA-GAL PANEL
Allergen Lamb IgE: 0.76 kU/L — AB
Beef IgE: <0.1 kU/L
IgE (Immunoglobulin E), Serum: 918 [IU]/mL — ABNORMAL HIGH (ref 6–495)
O215-IgE Alpha-Gal: <0.1 kU/L
Pork IgE: 1.77 kU/L — AB

## 2023-06-28 MED ORDER — TRIAMCINOLONE ACETONIDE 0.1 % EX OINT: 1.0000 | TOPICAL_OINTMENT | Freq: Two times a day (BID) | CUTANEOUS | 0 refills | Status: DC | PRN

## 2023-07-03 ENCOUNTER — Encounter: Payer: Self-pay | Admitting: Gastroenterology

## 2023-07-03 ENCOUNTER — Ambulatory Visit (AMBULATORY_SURGERY_CENTER): Payer: BC Managed Care – PPO | Admitting: Gastroenterology

## 2023-07-03 VITALS — BP 125/79 | HR 79 | Temp 98.4°F | Resp 15 | Ht 61.0 in | Wt 189.0 lb

## 2023-07-03 DIAGNOSIS — K222 Esophageal obstruction: Secondary | ICD-10-CM

## 2023-07-03 DIAGNOSIS — D649 Anemia, unspecified: Secondary | ICD-10-CM

## 2023-07-03 DIAGNOSIS — K2970 Gastritis, unspecified, without bleeding: Secondary | ICD-10-CM | POA: Diagnosis not present

## 2023-07-03 DIAGNOSIS — K2950 Unspecified chronic gastritis without bleeding: Secondary | ICD-10-CM | POA: Diagnosis not present

## 2023-07-03 MED ORDER — SODIUM CHLORIDE 0.9 % IV SOLN
500.0000 mL | INTRAVENOUS | Status: DC
Start: 2023-07-03 — End: 2023-07-03

## 2023-07-03 NOTE — Progress Notes (Signed)
Report to PACU, RN, vss, BBS= Clear.  

## 2023-07-03 NOTE — Op Note (Signed)
[facility] Patient Name: Brianna Gutierrez Procedure Date: 07/03/2023 9:39 AM MRN: [id number] Endoscopist: Doristine Locks , MD, [id number] Age: 47 Referring MD:  Date of Birth: [birth year] Gender: Female Account #: [id number] Procedure:                Upper GI endoscopy Indications:              Iron deficiency anemia Medicines:                Monitored Anesthesia Care Procedure:                Pre-Anesthesia Assessment:                           - Prior to the procedure, a History and Physical                            was performed, and patient medications and                            allergies were reviewed. The patient's tolerance of                            previous anesthesia was also reviewed. The risks                            and benefits of the procedure and the sedation                            options and risks were discussed with the patient.                            All questions were answered, and informed consent                            was obtained. Prior Anticoagulants: The patient has                            taken no anticoagulant or antiplatelet agents. ASA                            Grade Assessment: II - A patient with mild systemic                            disease. After reviewing the risks and benefits,                            the patient was deemed in satisfactory condition to                            undergo the procedure.                           After obtaining informed consent, the endoscope was  passed under direct vision. Throughout the                            procedure, the patient's blood pressure, pulse, and                            oxygen saturations were monitored continuously. The                            Olympus Scope [id number] was introduced through the                            mouth, and advanced to the third part of duodenum.                            The upper GI endoscopy  was accomplished without                            difficulty. The patient tolerated the procedure                            well. Scope In: Scope Out: Findings:                 The upper third of the esophagus and middle third                            of the esophagus were normal.                           A non-obstructing Schatzki ring was found in the                            lower third of the esophagus.                           The Z-line was regular.                           Localized minimal inflammation characterized by                            erythema was found in the prepyloric region of the                            stomach. Biopsies were taken with a cold forceps                            for Helicobacter pylori testing. Estimated blood                            loss was minimal.                           The gastric fundus, gastric body and incisura were  normal. Biopsies were taken with a cold forceps for                            Helicobacter pylori testing. Estimated blood loss                            was minimal.                           The examined duodenum was normal. Biopsies were                            taken with a cold forceps for histology. Estimated                            blood loss was minimal. Complications:            No immediate complications. Estimated Blood Loss:     Estimated blood loss was minimal. Impression:               - Normal upper third of esophagus and middle third                            of esophagus.                           - Non-obstructing Schatzki ring.                           - Z-line regular.                           - Gastritis. Biopsied.                           - Normal gastric fundus, gastric body and incisura.                            Biopsied.                           - Normal examined duodenum. Biopsied. Recommendation:           - Patient has a contact number  available for                            emergencies. The signs and symptoms of potential                            delayed complications were discussed with the                            patient. Return to normal activities tomorrow.                            Written discharge instructions were provided to the  patient.                           - Resume previous diet.                           - Continue present medications.                           - Await pathology results.                           - Return to GI clinic PRN. Doristine Locks, MD 07/03/2023 10:13:59 AM

## 2023-07-03 NOTE — Progress Notes (Signed)
Pt states not changes to health hx since previsit.

## 2023-07-03 NOTE — Progress Notes (Signed)
GASTROENTEROLOGY PROCEDURE H&P NOTE   Primary Care Physician: [name], NP    Reason for Procedure:   IDA  Plan:    EGD  Patient is appropriate for endoscopic procedure(s) in the ambulatory ([facility]) setting.  The nature of the procedure, as well as the risks, benefits, and alternatives were carefully and thoroughly reviewed with the patient. Ample time for discussion and questions allowed. The patient understood, was satisfied, and agreed to proceed.     HPI: Brianna Gutierrez is a 47 y.o. female who presents for EGD for further evaluation of IDA.   Past Medical History:  Diagnosis Date   Abnormal Pap smear of cervix    Anxiety    Depression    Dysmenorrhea    Eczema    History of COVID-19 10/24/2020   loss of taste and smellbody aches and cough x 4 days all symptoms resolved   History of kidney stones    Hypertension    Pre-diabetes    PTSD (post-traumatic stress disorder)    Rash 08/31/2021   comes and goes on legs saw dermetology no rash now ? food allergy   Sleep apnea    SUI (stress urinary incontinence, female)    Trichomonas infection 02/2021   Uterine fibroid     Past Surgical History:  Procedure Laterality Date   BLADDER SUSPENSION N/A 09/11/2021   Procedure: TRANSVAGINAL TAPE (TVT) PROCEDURE;  Surgeon: Patton Salles, MD;  Location: [facility] ;  Service: Gynecology;  Laterality: N/A;   CESAREAN SECTION  1997   x 1   CHOLECYSTECTOMY N/A 01/14/2019   Procedure: LAPAROSCOPIC CHOLECYSTECTOMY WITH POSSIBLE  INTRAOPERATIVE CHOLANGIOGRAM;  Surgeon: Darnell Level, MD;  Location: [facility];  Service: General;  Laterality: N/A;   CYSTOSCOPY N/A 09/11/2021   Procedure: Bluford Kaufmann;  Surgeon: Patton Salles, MD;  Location: [facility];  Service: Gynecology;  Laterality: N/A;   CYSTOSCOPY/URETEROSCOPY/HOLMIUM LASER Left 01/31/2022   Procedure: LEFT URETEROSCOPY/HOLMIUM LASER;  Surgeon: Despina Arias, MD;   Location: [facility];  Service: Urology;  Laterality: Left;   DILITATION & CURRETTAGE/HYSTROSCOPY WITH HYDROTHERMAL ABLATION N/A 06/22/2016   Procedure: DILATATION & CURETTAGE/HYSTEROSCOPY WITH HYDROTHERMAL ABLATION;  Surgeon: Brock Bad, MD;  Location: [facility];  Service: Gynecology;  Laterality: N/A;   TOTAL LAPAROSCOPIC HYSTERECTOMY WITH SALPINGECTOMY Bilateral 09/11/2021   Procedure: TOTAL LAPAROSCOPIC HYSTERECTOMY WITH SALPINGECTOMY;  Surgeon: Patton Salles, MD;  Location: [facility];  Service: Gynecology;  Laterality: Bilateral;   TUBAL LIGATION     09-28-2015    Prior to Admission medications   Medication Sig Start Date End Date Taking? Authorizing Provider  amLODipine (NORVASC) 10 MG tablet Take 1 tablet (10 mg total) by mouth every evening. 06/02/23 08/31/23 Yes Zonia Kief, Amy J, NP  hydrochlorothiazide (HYDRODIURIL) 25 MG tablet Take 1 tablet (25 mg total) by mouth daily. 06/02/23 08/31/23 Yes Zonia Kief, Amy J, NP  lamoTRIgine (LAMICTAL) 100 MG tablet Take 1 tablet (100 mg total) by mouth daily. 05/09/23 08/07/23 Yes Stasia Cavalier, MD  triamcinolone ointment (KENALOG) 0.1 % Apply 1 Application topically 2 (two) times daily as needed (rash flare). Do not use on the face, neck, armpits or groin area. Do not use more than 3 weeks in a row. 06/28/23  Yes Ellamae Sia, DO  albuterol (VENTOLIN HFA) 108 (90 Base) MCG/ACT inhaler Inhale 2 puffs into the lungs every 6 (six) hours as needed for wheezing or shortness of breath. 02/14/22  Cobb, Ruby Cola, NP  baclofen (LIORESAL) 10 MG tablet Take 1 tablet (10 mg total) by mouth 3 (three) times daily. 03/18/23   Juanda Chance, NP  EPINEPHrine (EPIPEN 2-PAK) 0.3 mg/0.3 mL IJ SOAJ injection Inject 0.3 mg into the muscle as needed for anaphylaxis. 03/27/23   [name], NP  famotidine (PEPCID) 20 MG tablet Take 1 tablet (20 mg total) by mouth 2 (two) times daily. 04/18/22   Ellamae Sia, DO  ferrous sulfate 325 (65 FE) MG EC  tablet Take 1 tablet by mouth once every other day with a source of vitamin C. 03/04/23   Thayil, Nelia Shi, PA-C  fexofenadine (ALLEGRA) 180 MG tablet Take 2 tablets twice a day for hives. 08/21/22   Ellamae Sia, DO  gabapentin (NEURONTIN) 300 MG capsule Take 1 capsule by mouth at bedtime.    [provider]  meloxicam (MOBIC) 15 MG tablet Take 1 tablet (15 mg total) by mouth daily. 04/29/23 04/28/24  Juanda Chance, NP  metFORMIN (GLUCOPHAGE) 500 MG tablet Take 1 tablet (500 mg total) by mouth 2 (two) times daily with a meal. 02/26/23 05/27/23  [name], NP  methocarbamol (ROBAXIN) 500 MG tablet Take 1 tablet (500 mg total) by mouth 2 (two) times daily. 02/03/23   Prosperi, Christian H, PA-C  montelukast (SINGULAIR) 10 MG tablet Take 1 tablet (10 mg total) by mouth at bedtime. 06/24/23   Ellamae Sia, DO  XOLAIR 150 MG/ML prefilled syringe Inject 300 mg into the skin every 28 (twenty-eight) days. 08/27/22   [provider]  dicyclomine (BENTYL) 20 MG tablet Take 1 tablet (20 mg total) by mouth 2 (two) times daily. 10/29/19 08/24/20  Belinda Fisher, PA-C  omeprazole (PRILOSEC) 20 MG capsule Take 1 capsule (20 mg total) by mouth daily. 01/01/19 08/07/19  Bing Neighbors, NP    Current Outpatient Medications  Medication Sig Dispense Refill   amLODipine (NORVASC) 10 MG tablet Take 1 tablet (10 mg total) by mouth every evening. 90 tablet 0   hydrochlorothiazide (HYDRODIURIL) 25 MG tablet Take 1 tablet (25 mg total) by mouth daily. 90 tablet 0   lamoTRIgine (LAMICTAL) 100 MG tablet Take 1 tablet (100 mg total) by mouth daily. 90 tablet 0   triamcinolone ointment (KENALOG) 0.1 % Apply 1 Application topically 2 (two) times daily as needed (rash flare). Do not use on the face, neck, armpits or groin area. Do not use more than 3 weeks in a row. 30 g 0   albuterol (VENTOLIN HFA) 108 (90 Base) MCG/ACT inhaler Inhale 2 puffs into the lungs every 6 (six) hours as needed for wheezing or shortness of  breath. 8 g 2   baclofen (LIORESAL) 10 MG tablet Take 1 tablet (10 mg total) by mouth 3 (three) times daily. 30 each 0   EPINEPHrine (EPIPEN 2-PAK) 0.3 mg/0.3 mL IJ SOAJ injection Inject 0.3 mg into the muscle as needed for anaphylaxis. 2 each 2   famotidine (PEPCID) 20 MG tablet Take 1 tablet (20 mg total) by mouth 2 (two) times daily. 60 tablet 3   ferrous sulfate 325 (65 FE) MG EC tablet Take 1 tablet by mouth once every other day with a source of vitamin C. 30 tablet 6   fexofenadine (ALLEGRA) 180 MG tablet Take 2 tablets twice a day for hives. 120 tablet 2   gabapentin (NEURONTIN) 300 MG capsule Take 1 capsule by mouth at bedtime.     meloxicam (MOBIC) 15 MG  tablet Take 1 tablet (15 mg total) by mouth daily. 30 tablet 0   metFORMIN (GLUCOPHAGE) 500 MG tablet Take 1 tablet (500 mg total) by mouth 2 (two) times daily with a meal. 180 tablet 0   methocarbamol (ROBAXIN) 500 MG tablet Take 1 tablet (500 mg total) by mouth 2 (two) times daily. 20 tablet 0   montelukast (SINGULAIR) 10 MG tablet Take 1 tablet (10 mg total) by mouth at bedtime. 30 tablet 5   XOLAIR 150 MG/ML prefilled syringe Inject 300 mg into the skin every 28 (twenty-eight) days.     Current Facility-Administered Medications  Medication Dose Route Frequency Provider Last Rate Last Admin   0.9 %  sodium chloride infusion  500 mL Intravenous Continuous Ravenne Wayment V, DO       omalizumab (XOLAIR) prefilled syringe 300 mg  300 mg Subcutaneous Q28 days Nehemiah Settle, FNP   300 mg at 05/30/23 1810    Allergies as of 07/03/2023 - Review Complete 07/03/2023  Allergen Reaction Noted   Pork-derived products  07/30/2022   Tramadol  08/21/2022    Family History  Problem Relation Age of Onset   Colon polyps Sister    Hypertension Maternal Grandmother    Diabetes Maternal Grandfather    Allergic rhinitis Daughter    Allergic rhinitis Daughter    Allergic rhinitis Son    Allergic rhinitis Son    Colon cancer Neg Hx     Esophageal cancer Neg Hx    Rectal cancer Neg Hx    Stomach cancer Neg Hx     Social History   Socioeconomic History   Marital status: Legally Separated    Spouse name: Not on file   Number of children: Not on file   Years of education: Not on file   Highest education level: 12th grade  Occupational History   Not on file  Tobacco Use   Smoking status: Every Day    Current packs/day: 0.50    Average packs/day: 0.5 packs/day for 20.0 years (10.0 ttl pk-yrs)    Types: Cigarettes    Passive exposure: Never   Smokeless tobacco: Never  Vaping Use   Vaping status: Former  Substance and Sexual Activity   Alcohol use: Not Currently   Drug use: Never   Sexual activity: Not Currently    Partners: Male    Birth control/protection: Surgical    Comment: TLH 09/11/21  Other Topics Concern   Not on file  Social History Narrative   ** Merged History Encounter **       Social Determinants of Health   Financial Resource Strain: Medium Risk (02/22/2023)   Overall Financial Resource Strain (CARDIA)    Difficulty of Paying Living Expenses: Somewhat hard  Food Insecurity: Food Insecurity Present (02/22/2023)   Hunger Vital Sign    Worried About Running Out of Food in the Last Year: Sometimes true    Ran Out of Food in the Last Year: Sometimes true  Transportation Needs: No Transportation Needs (02/22/2023)   PRAPARE - Administrator, Civil Service (Medical): No    Lack of Transportation (Non-Medical): No  Physical Activity: Inactive (02/22/2023)   Exercise Vital Sign    Days of Exercise per Week: 1 day    Minutes of Exercise per Session: 0 min  Stress: Stress Concern Present (02/22/2023)   [employer] of Occupational Health - Occupational Stress Questionnaire    Feeling of Stress : Very much  Social Connections: Socially Isolated (02/22/2023)   Social  Connection and Isolation Panel [NHANES]    Frequency of Communication with Friends and Family: More than three times a  week    Frequency of Social Gatherings with Friends and Family: Once a week    Attends Religious Services: Never    Database administrator or Organizations: No    Attends Engineer, structural: Not on file    Marital Status: Separated  Intimate Partner Violence: Not on file    Physical Exam: Vital signs in last 24 hours: @BP  117/80   Pulse 76   Temp 98.4 F (36.9 C)   Resp 17   Ht 5\' 1"  (1.549 m)   Wt 189 lb (85.7 kg)   LMP  (LMP Unknown)   SpO2 99%   BMI 35.71 kg/m  GEN: NAD EYE: Sclerae anicteric ENT: MMM CV: Non-tachycardic Pulm: CTA b/l GI: Soft, NT/ND NEURO:  Alert & Oriented x 3   Doristine Locks, DO [facility]   07/03/2023 9:58 AM

## 2023-07-03 NOTE — Patient Instructions (Signed)
Please read handouts provided. Continue present medications. Await pathology results. Return to GI clinic as needed.   YOU HAD AN ENDOSCOPIC PROCEDURE TODAY AT THE  ENDOSCOPY CENTER:   Refer to the procedure report that was given to you for any specific questions about what was found during the examination.  If the procedure report does not answer your questions, please call your gastroenterologist to clarify.  If you requested that your care partner not be given the details of your procedure findings, then the procedure report has been included in a sealed envelope for you to review at your convenience later.  YOU SHOULD EXPECT: Some feelings of bloating in the abdomen. Passage of more gas than usual.  Walking can help get rid of the air that was put into your GI tract during the procedure and reduce the bloating. If you had a lower endoscopy (such as a colonoscopy or flexible sigmoidoscopy) you may notice spotting of blood in your stool or on the toilet paper. If you underwent a bowel prep for your procedure, you may not have a normal bowel movement for a few days.  Please Note:  You might notice some irritation and congestion in your nose or some drainage.  This is from the oxygen used during your procedure.  There is no need for concern and it should clear up in a day or so.  SYMPTOMS TO REPORT IMMEDIATELY:  Following upper endoscopy (EGD)  Vomiting of blood or coffee ground material  New chest pain or pain under the shoulder blades  Painful or persistently difficult swallowing  New shortness of breath  Fever of 100F or higher  Black, tarry-looking stools  For urgent or emergent issues, a gastroenterologist can be reached at any hour by calling [phone]. Do not use MyChart messaging for urgent concerns.    DIET:  We do recommend a small meal at first, but then you may proceed to your regular diet.  Drink plenty of fluids but you should avoid alcoholic beverages for 24  hours.  ACTIVITY:  You should plan to take it easy for the rest of today and you should NOT DRIVE or use heavy machinery until tomorrow (because of the sedation medicines used during the test).    FOLLOW UP: Our staff will call the number listed on your records the next business day following your procedure.  We will call around 7:15- 8:00 am to check on you and address any questions or concerns that you may have regarding the information given to you following your procedure. If we do not reach you, we will leave a message.     If any biopsies were taken you will be contacted by phone or by letter within the next 1-3 weeks.  Please call us at [phone] if you have not heard about the biopsies in 3 weeks.    SIGNATURES/CONFIDENTIALITY: You and/or your care partner have signed paperwork which will be entered into your electronic medical record.  These signatures attest to the fact that that the information above on your After Visit Summary has been reviewed and is understood.  Full responsibility of the confidentiality of this discharge information lies with you and/or your care-partner.

## 2023-07-03 NOTE — Progress Notes (Signed)
Called to room to assist during endoscopic procedure.  Patient ID and intended procedure confirmed with present staff. Received instructions for my participation in the procedure from the performing physician.  

## 2023-07-03 NOTE — Progress Notes (Signed)
Agree with the assessment and plan as outlined by Amanda Collier, PA-C. ? ?Vito Cirigliano, DO, FACG ? ?

## 2023-07-04 ENCOUNTER — Telehealth: Payer: Self-pay

## 2023-07-04 ENCOUNTER — Ambulatory Visit: Payer: BC Managed Care – PPO

## 2023-07-04 NOTE — Telephone Encounter (Signed)
See routing comment(s) for message information.

## 2023-07-04 NOTE — Telephone Encounter (Signed)
Follow up call to pt, lm for pt to call if having any difficulty with normal activities or eating and drinking.  Also to call if any other questions or concerns.  

## 2023-07-05 ENCOUNTER — Ambulatory Visit: Payer: BC Managed Care – PPO | Admitting: Family

## 2023-07-05 ENCOUNTER — Ambulatory Visit: Payer: BC Managed Care – PPO | Admitting: Podiatry

## 2023-07-08 ENCOUNTER — Ambulatory Visit (INDEPENDENT_AMBULATORY_CARE_PROVIDER_SITE_OTHER): Payer: BC Managed Care – PPO | Admitting: Family

## 2023-07-08 ENCOUNTER — Encounter: Payer: Self-pay | Admitting: Family

## 2023-07-08 ENCOUNTER — Ambulatory Visit: Payer: BC Managed Care – PPO

## 2023-07-08 VITALS — BP 120/82 | HR 101 | Temp 99.4°F | Ht 61.0 in | Wt 196.2 lb

## 2023-07-08 DIAGNOSIS — I10 Essential (primary) hypertension: Secondary | ICD-10-CM | POA: Diagnosis not present

## 2023-07-08 DIAGNOSIS — R7303 Prediabetes: Secondary | ICD-10-CM

## 2023-07-08 DIAGNOSIS — Z13220 Encounter for screening for lipoid disorders: Secondary | ICD-10-CM | POA: Diagnosis not present

## 2023-07-08 IMAGING — CR DG CHEST 2V
2 series · 2 of 2 positions shown · non-contrast
Comparison: 11/04/2017.

CLINICAL DATA: picc line placement

EXAM:
CHEST - 2 VIEW

[w chest pa]
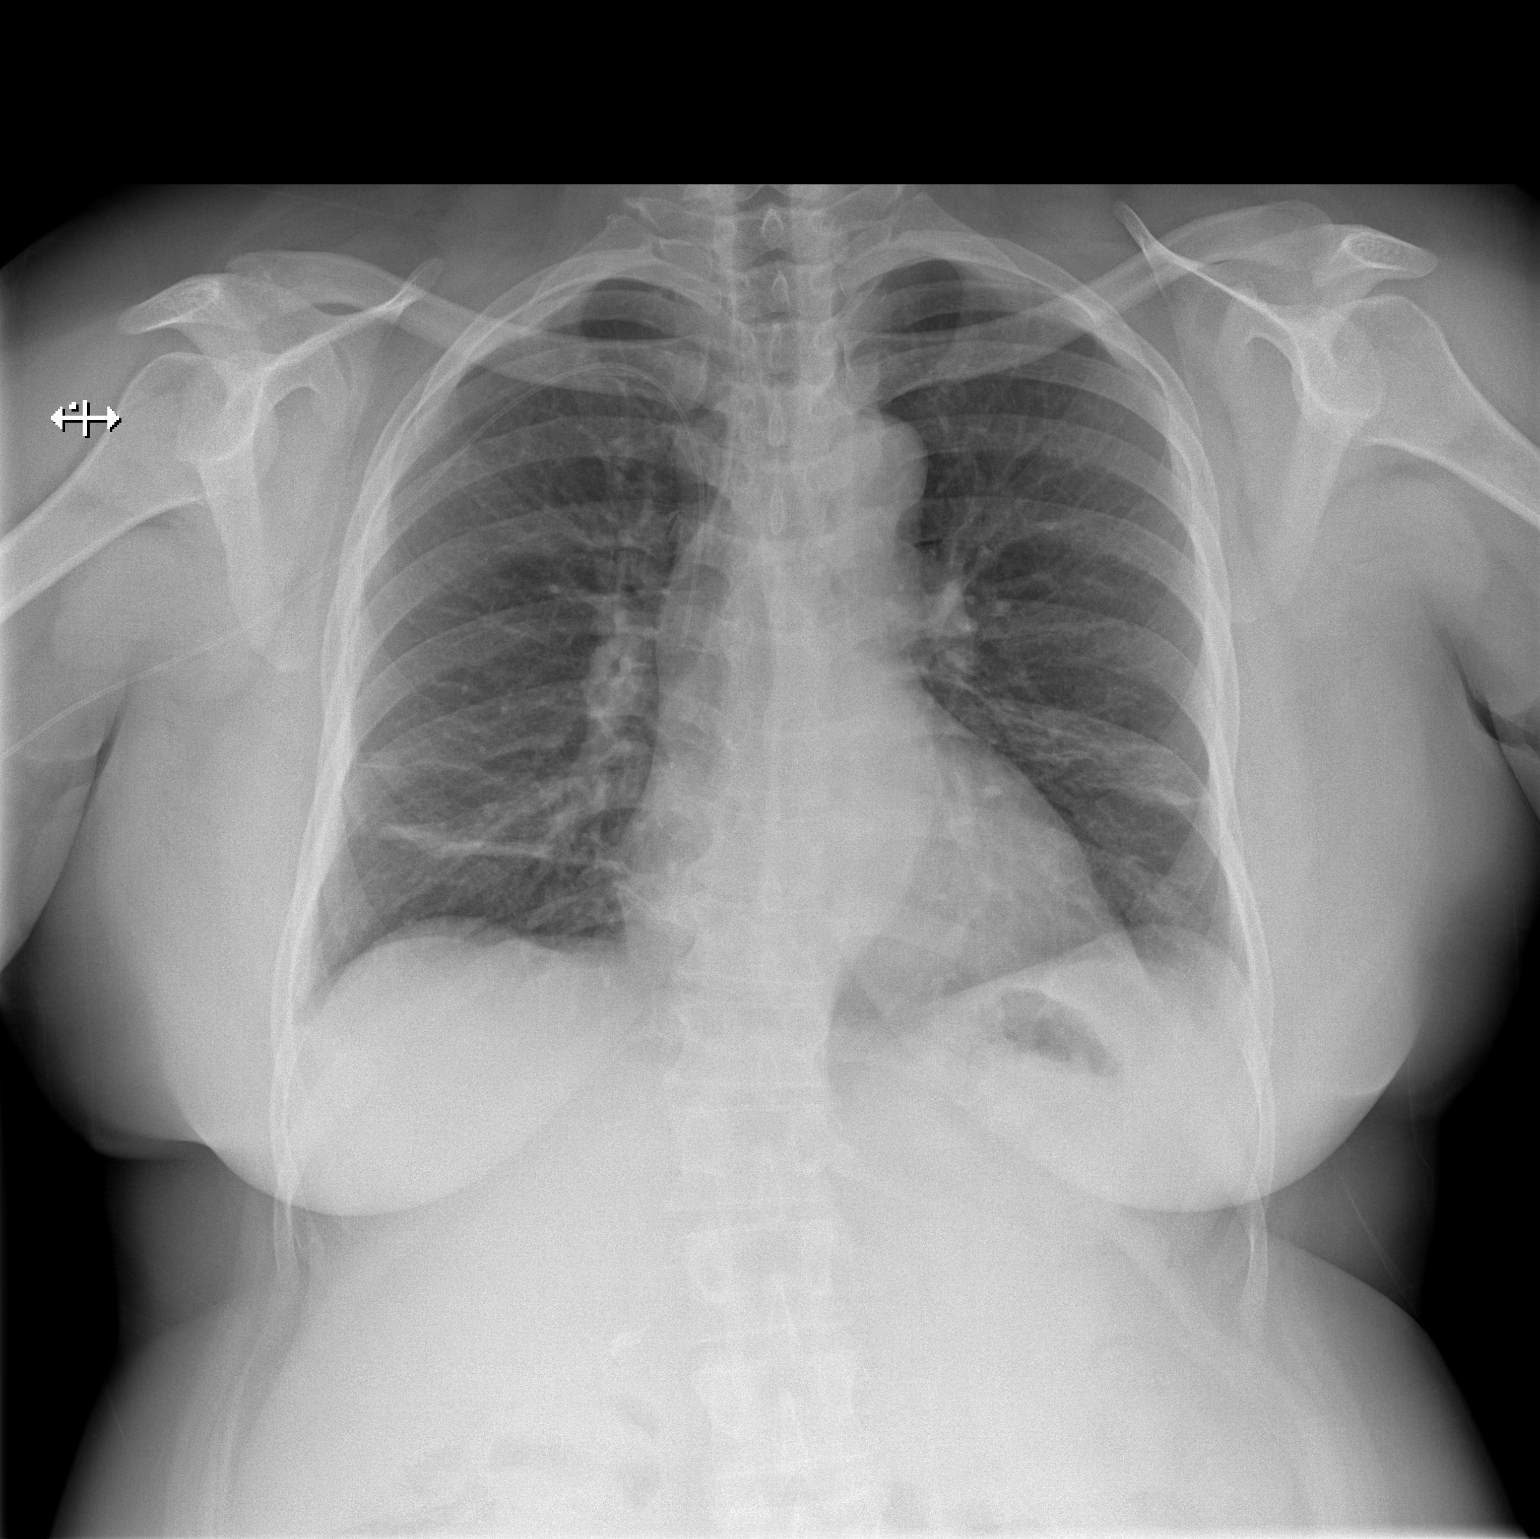

[w chest lat]
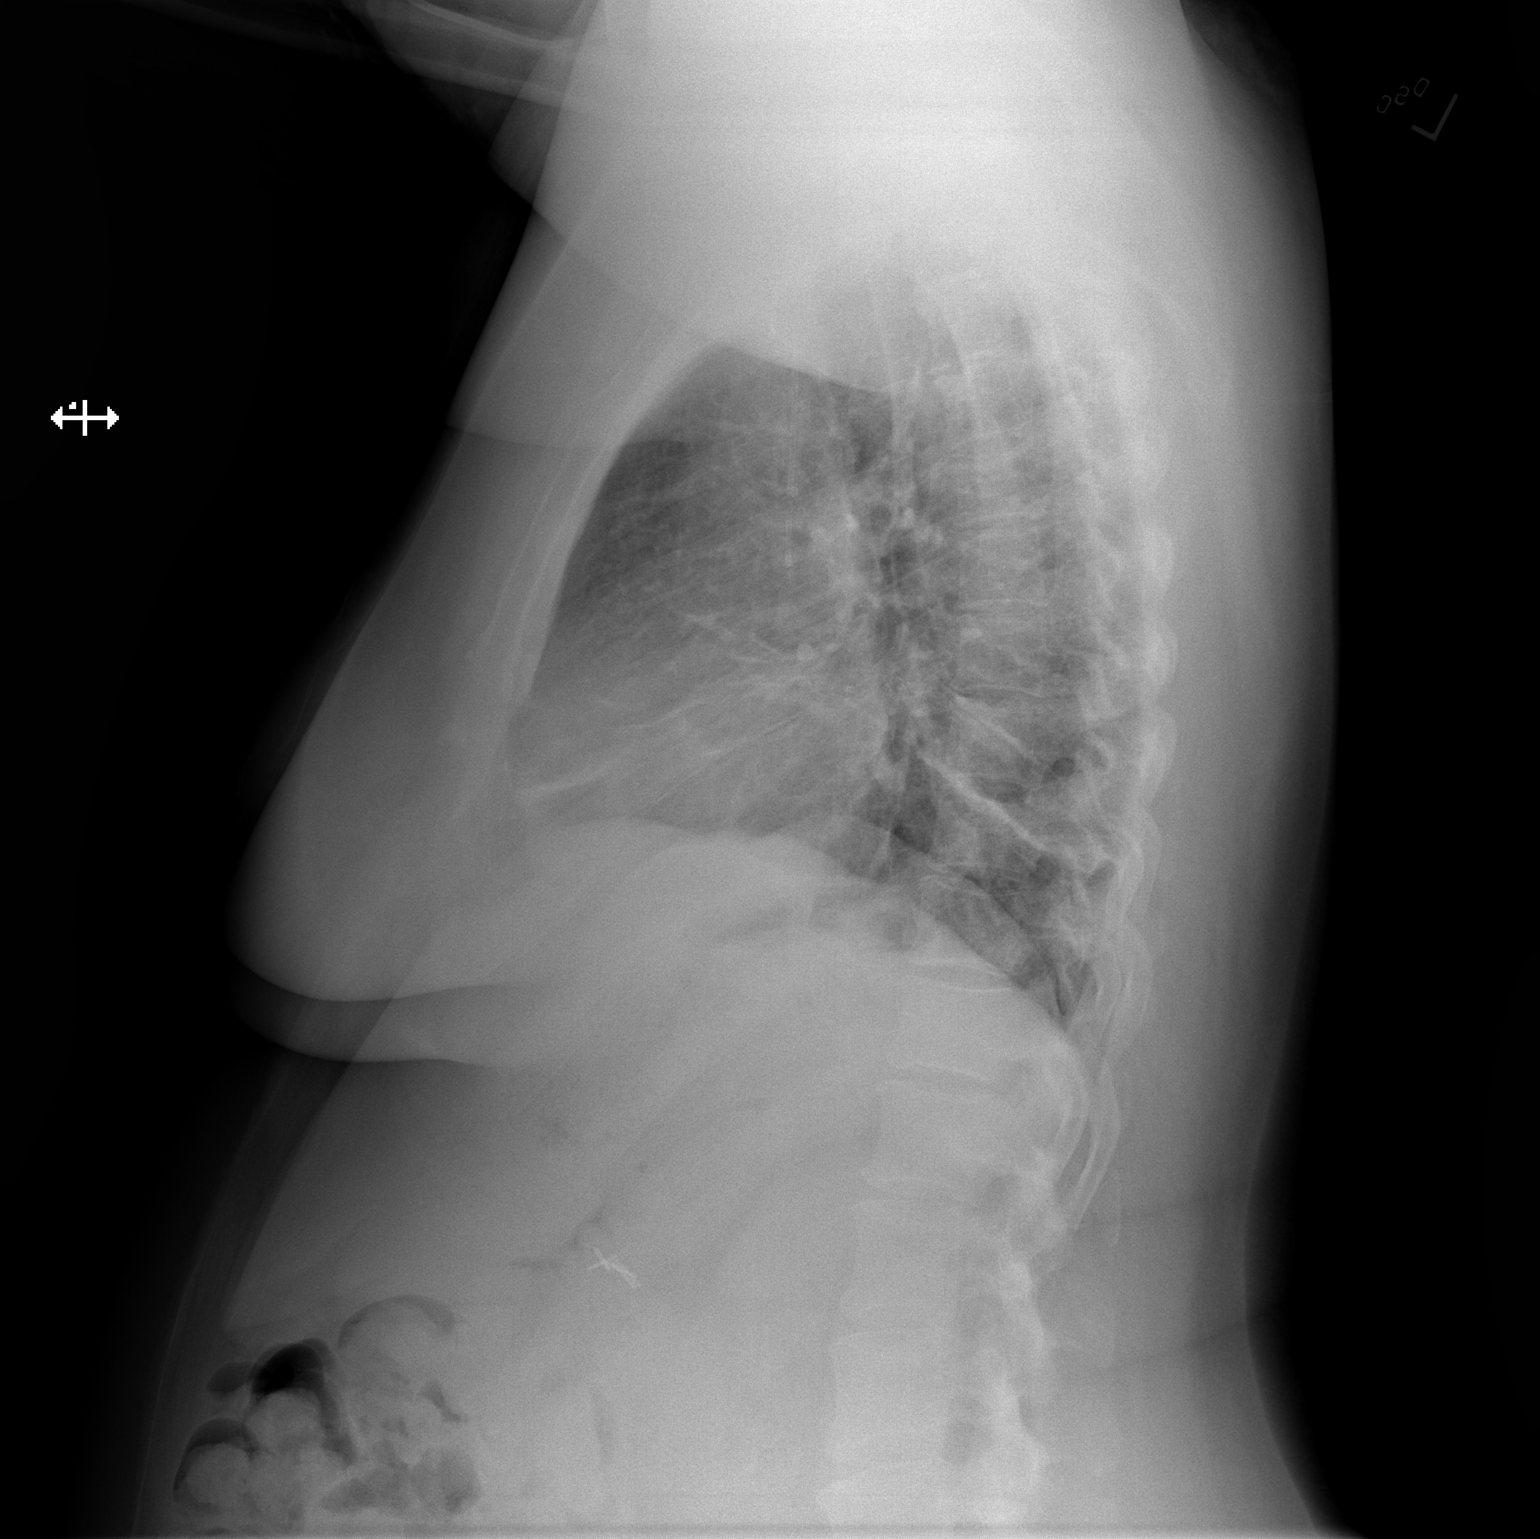

[2 of 2 positions shown; findings below may reference images not displayed]

FINDINGS: Right upper extremity PICC with the tip projecting at the lower SVC.
Linear opacities in bilateral lung bases, compatible subsegmental
atelectasis. No confluent consolidation. No visible pleural
effusions or pneumothorax. Cardiomediastinal silhouette is similar.
Similar tortuous aorta. Cholecystectomy clips. Mild reverse S-shaped
thoracolumbar curvature.
IMPRESSION: 1. Right upper extremity PICC with the tip projecting at the lower
SVC.
2. Bibasilar subsegmental atelectasis.

## 2023-07-08 MED ORDER — HYDROCHLOROTHIAZIDE 25 MG PO TABS: 25.0000 mg | ORAL_TABLET | Freq: Every day | ORAL | 0 refills | Status: DC

## 2023-07-08 MED ORDER — METFORMIN HCL 500 MG PO TABS
500.0000 mg | ORAL_TABLET | Freq: Two times a day (BID) | ORAL | 0 refills | Status: DC
Start: 2023-07-08 — End: 2023-10-09

## 2023-07-08 MED ORDER — AMLODIPINE BESYLATE 10 MG PO TABS: 10.0000 mg | ORAL_TABLET | Freq: Every evening | ORAL | 0 refills | Status: DC

## 2023-07-08 NOTE — Progress Notes (Unsigned)
Patient ID: Brianna Gutierrez, female    DOB: 1975-11-16  MRN: [id number]  CC: Chronic Conditions Follow-Up  Subjective: Brianna Gutierrez is a 47 y.o. female who presents for chronic conditions follow-up.  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 90 tablet 0   baclofen (LIORESAL) 10 MG tablet Take 1 tablet (10 mg total) by mouth 3 (three) times daily. 30 each 0   EPINEPHrine (EPIPEN 2-PAK) 0.3 mg/0.3 mL IJ SOAJ injection Inject 0.3 mg into the muscle as needed for anaphylaxis. 2 each 2   gabapentin (NEURONTIN) 300 MG capsule Take 1 capsule by mouth at bedtime.     hydrochlorothiazide (HYDRODIURIL) 25 MG tablet Take 1 tablet (25 mg total) by mouth daily. 90 tablet 0   lamoTRIgine (LAMICTAL) 100 MG tablet Take 1 tablet (100 mg total) by mouth daily. 90 tablet 0   meloxicam (MOBIC) 15 MG tablet Take 1 tablet (15 mg total) by mouth daily. 30 tablet 0   metFORMIN (GLUCOPHAGE) 500 MG tablet Take 1 tablet (500 mg total)  by mouth 2 (two) times daily with a meal. 180 tablet 0   methocarbamol (ROBAXIN) 500 MG tablet Take 1 tablet (500 mg total) by mouth 2 (two) times daily. 20 tablet 0   triamcinolone ointment (KENALOG) 0.1 % Apply 1 Application topically 2 (two) times daily as needed (rash flare). Do not use on the face, neck, armpits or groin area. Do not use more than 3 weeks in a row. 30 g 0   XOLAIR 150 MG/ML prefilled syringe Inject 300 mg into the skin every 28 (twenty-eight) days.     [DISCONTINUED] dicyclomine (BENTYL) 20 MG tablet Take 1 tablet (20 mg total) by mouth 2 (two) times daily. 20 tablet 0   [DISCONTINUED] omeprazole (PRILOSEC) 20 MG capsule Take 1 capsule (20 mg total) by mouth daily. 30 capsule 1   Current Facility-Administered Medications on File Prior to Visit  Medication Dose Route Frequency Provider Last Rate Last Admin   omalizumab Geoffry Paradise) prefilled syringe 300 mg  300 mg Subcutaneous Q28 days Nehemiah Settle, FNP   300 mg at 05/30/23 1810    Allergies  Allergen Reactions   Pork-Derived Products    Tramadol     rash    Social History   Socioeconomic History   Marital status: Legally Separated    Spouse name: Not on file   Number of children: Not on file   Years of education: Not on file   Highest education level: 12th grade  Occupational History   Not on file  Tobacco  Use   Smoking status: Every Day    Current packs/day: 0.50    Average packs/day: 0.5 packs/day for 20.0 years (10.0 ttl pk-yrs)    Types: Cigarettes    Passive exposure: Never   Smokeless tobacco: Never  Vaping Use   Vaping status: Former  Substance and Sexual Activity   Alcohol use: Not Currently   Drug use: Never   Sexual activity: Not Currently    Partners: Male    Birth control/protection: Surgical    Comment: TLH 09/11/21  Other Topics Concern   Not on file  Social History Narrative   ** Merged History Encounter **       Social Determinants of Health   Financial Resource Strain: Medium  Risk (02/22/2023)   Overall Financial Resource Strain (CARDIA)    Difficulty of Paying Living Expenses: Somewhat hard  Food Insecurity: Food Insecurity Present (02/22/2023)   Hunger Vital Sign    Worried About Running Out of Food in the Last Year: Sometimes true    Ran Out of Food in the Last Year: Sometimes true  Transportation Needs: No Transportation Needs (02/22/2023)   PRAPARE - Administrator, Civil Service (Medical): No    Lack of Transportation (Non-Medical): No  Physical Activity: Inactive (02/22/2023)   Exercise Vital Sign    Days of Exercise per Week: 1 day    Minutes of Exercise per Session: 0 min  Stress: Stress Concern Present (02/22/2023)   [employer] of Occupational Health - Occupational Stress Questionnaire    Feeling of Stress : Very much  Social Connections: Socially Isolated (02/22/2023)   Social Connection and Isolation Panel [NHANES]    Frequency of Communication with Friends and Family: More than three times a week    Frequency of Social Gatherings with Friends and Family: Once a week    Attends Religious Services: Never    Database administrator or Organizations: No    Attends Engineer, structural: Not on file    Marital Status: Separated  Intimate Partner Violence: Not on file    Family History  Problem Relation Age of Onset   Colon polyps Sister    Hypertension Maternal Grandmother    Diabetes Maternal Grandfather    Allergic rhinitis Daughter    Allergic rhinitis Daughter    Allergic rhinitis Son    Allergic rhinitis Son    Colon cancer Neg Hx    Esophageal cancer Neg Hx    Rectal cancer Neg Hx    Stomach cancer Neg Hx     Past Surgical History:  Procedure Laterality Date   BLADDER SUSPENSION N/A 09/11/2021   Procedure: TRANSVAGINAL TAPE (TVT) PROCEDURE;  Surgeon: Patton Salles, MD;  Location: [facility];  Service: Gynecology;  Laterality: N/A;   CESAREAN SECTION  1997   x 1   CHOLECYSTECTOMY  N/A 01/14/2019   Procedure: LAPAROSCOPIC CHOLECYSTECTOMY WITH POSSIBLE  INTRAOPERATIVE CHOLANGIOGRAM;  Surgeon: Darnell Level, MD;  Location: [facility];  Service: General;  Laterality: N/A;   CYSTOSCOPY N/A 09/11/2021   Procedure: Bluford Kaufmann;  Surgeon: Patton Salles, MD;  Location: [facility];  Service: Gynecology;  Laterality: N/A;   CYSTOSCOPY/URETEROSCOPY/HOLMIUM LASER Left 01/31/2022   Procedure: LEFT URETEROSCOPY/HOLMIUM LASER;  Surgeon: Despina Arias, MD;  Location: [facility];  Service: Urology;  Laterality: Left;   DILITATION & CURRETTAGE/HYSTROSCOPY WITH HYDROTHERMAL ABLATION N/A 06/22/2016   Procedure: DILATATION & CURETTAGE/HYSTEROSCOPY WITH HYDROTHERMAL ABLATION;  Surgeon: Brock Bad,  MD;  Location: [facility];  Service: Gynecology;  Laterality: N/A;   TOTAL LAPAROSCOPIC HYSTERECTOMY WITH SALPINGECTOMY Bilateral 09/11/2021   Procedure: TOTAL LAPAROSCOPIC HYSTERECTOMY WITH SALPINGECTOMY;  Surgeon: Patton Salles, MD;  Location[facility];  Service: Gynecology;  Laterality: Bilateral;   TUBAL LIGATION     09-28-2015    ROS: Review of Systems Negative except as stated above  PHYSICAL EXAM: BP 120/82   Pulse (!) 101   Temp 99.4 F (37.4 C) (Oral)   Ht 5\' 1"  (1.549 m)   Wt 196 lb 3.2 oz (89 kg)   LMP  (LMP Unknown)   SpO2 94%   BMI 37.07 kg/m   Physical Exam  {female adult master:310786} {female adult master:310785}     Latest Ref Rng & Units 03/26/2023    8:46 PM 02/06/2023   10:17 AM 02/03/2023    9:35 PM  CMP  Glucose 70 - 99 mg/dL 914  92  95   BUN 6 - 24 mg/dL 20  12  12    Creatinine 0.57 - 1.00 mg/dL 7.82  9.56  2.13   Sodium 134 - 144 mmol/L 138  138  138   Potassium 3.5 - 5.2 mmol/L 3.6  4.5  3.7   Chloride 96 - 106 mmol/L 98  105  106   CO2 20 - 29 mmol/L 24  29  24    Calcium 8.7 - 10.2 mg/dL 9.7  9.1  8.4   Total Protein 6.5 - 8.1 g/dL  7.6  7.4   Total Bilirubin 0.3 - 1.2 mg/dL  0.3  0.4   Alkaline Phos  38 - 126 U/L  102  94   AST 15 - 41 U/L  9  12   ALT 0 - 44 U/L  13  15    Lipid Panel     Component Value Date/Time   CHOL 205 (H) 12/23/2020 1528   TRIG 90 12/23/2020 1528   HDL 47 12/23/2020 1528   CHOLHDL 4.4 12/23/2020 1528   LDLCALC 142 (H) 12/23/2020 1528    CBC    Component Value Date/Time   WBC 6.7 02/06/2023 1017   WBC 7.0 02/03/2023 1945   RBC 6.01 (H) 02/06/2023 1017   HGB 13.5 02/06/2023 1017   HGB 12.9 07/30/2022 1559   HCT 43.1 02/06/2023 1017   HCT 41.9 07/30/2022 1559   PLT 338 02/06/2023 1017   PLT 394 07/30/2022 1559   MCV 71.7 (L) 02/06/2023 1017   MCV 71 (L) 07/30/2022 1559   MCH 22.5 (L) 02/06/2023 1017   MCHC 31.3 02/06/2023 1017   RDW 16.8 (H) 02/06/2023 1017   RDW 16.0 (H) 07/30/2022 1559   LYMPHSABS 1.4 02/06/2023 1017   LYMPHSABS 1.5 07/30/2022 1559   MONOABS 0.7 02/06/2023 1017   EOSABS 0.3 02/06/2023 1017   EOSABS 0.1 07/30/2022 1559   BASOSABS 0.0 02/06/2023 1017   BASOSABS 0.0 07/30/2022 1559    ASSESSMENT AND PLAN:  There are no diagnoses linked to this encounter.   Patient was given the opportunity to ask questions.  Patient verbalized understanding of the plan and was able to repeat key elements of the plan. Patient was given clear instructions to go to Emergency Department or return to medical center if symptoms don't improve, worsen, or new problems develop.The patient verbalized understanding.   No orders of the defined types were placed in this encounter.    Requested Prescriptions    No prescriptions requested or ordered in this  encounter    No follow-ups on file.  [name], NP

## 2023-07-09 ENCOUNTER — Other Ambulatory Visit: Payer: Self-pay | Admitting: Family

## 2023-07-09 DIAGNOSIS — Z13220 Encounter for screening for lipoid disorders: Secondary | ICD-10-CM

## 2023-07-09 LAB — LIPID PANEL
Chol/HDL Ratio: 4 ratio (ref 0.0–4.4)
Cholesterol, Total: 199 mg/dL (ref 100–199)
HDL: 50 mg/dL (ref >39)
LDL Chol Calc (NIH): 134 mg/dL — ABNORMAL HIGH (ref 0–99)
Triglycerides: 82 mg/dL (ref 0–149)
VLDL Cholesterol Cal: 15 mg/dL (ref 5–40)

## 2023-07-09 LAB — BASIC METABOLIC PANEL
BUN/Creatinine Ratio: 14 (ref 9–23)
BUN: 11 mg/dL (ref 6–24)
CO2: 25 mmol/L (ref 20–29)
Calcium: 9.2 mg/dL (ref 8.7–10.2)
Chloride: 103 mmol/L (ref 96–106)
Creatinine, Ser: 0.81 mg/dL (ref 0.57–1.00)
Glucose: 172 mg/dL — ABNORMAL HIGH (ref 70–99)
Potassium: 3.4 mmol/L — ABNORMAL LOW (ref 3.5–5.2)
Sodium: 142 mmol/L (ref 134–144)
eGFR: 90 mL/min/{1.73_m2} (ref >59)

## 2023-07-09 LAB — HEMOGLOBIN A1C
Est. average glucose Bld gHb Est-mCnc: 137 mg/dL
Hgb A1c MFr Bld: 6.4 % — ABNORMAL HIGH (ref 4.8–5.6)

## 2023-07-23 ENCOUNTER — Ambulatory Visit (INDEPENDENT_AMBULATORY_CARE_PROVIDER_SITE_OTHER): Payer: BC Managed Care – PPO

## 2023-07-23 DIAGNOSIS — L501 Idiopathic urticaria: Secondary | ICD-10-CM

## 2023-08-06 ENCOUNTER — Other Ambulatory Visit: Payer: Self-pay

## 2023-08-06 ENCOUNTER — Emergency Department (HOSPITAL_BASED_OUTPATIENT_CLINIC_OR_DEPARTMENT_OTHER)
Admission: EM | Admit: 2023-08-06 | Discharge: 2023-08-06 | Disposition: A | Discharge status: Home / Self Care | Payer: BC Managed Care – PPO | Attending: Emergency Medicine | Admitting: Emergency Medicine

## 2023-08-06 ENCOUNTER — Encounter (HOSPITAL_BASED_OUTPATIENT_CLINIC_OR_DEPARTMENT_OTHER): Payer: Self-pay | Admitting: Urology

## 2023-08-06 DIAGNOSIS — G8929 Other chronic pain: Secondary | ICD-10-CM | POA: Insufficient documentation

## 2023-08-06 DIAGNOSIS — Z7984 Long term (current) use of oral hypoglycemic drugs: Secondary | ICD-10-CM | POA: Diagnosis not present

## 2023-08-06 DIAGNOSIS — I10 Essential (primary) hypertension: Secondary | ICD-10-CM | POA: Diagnosis not present

## 2023-08-06 DIAGNOSIS — R7303 Prediabetes: Secondary | ICD-10-CM | POA: Insufficient documentation

## 2023-08-06 DIAGNOSIS — M5459 Other low back pain: Secondary | ICD-10-CM | POA: Diagnosis not present

## 2023-08-06 DIAGNOSIS — Z79899 Other long term (current) drug therapy: Secondary | ICD-10-CM | POA: Insufficient documentation

## 2023-08-06 DIAGNOSIS — M545 Low back pain, unspecified: Secondary | ICD-10-CM | POA: Insufficient documentation

## 2023-08-06 MED ORDER — LIDOCAINE 5 % EX PTCH: 1.0000 | MEDICATED_PATCH | CUTANEOUS | 0 refills | Status: DC

## 2023-08-06 MED ORDER — LIDOCAINE 5 % EX PTCH
1.0000 | MEDICATED_PATCH | Freq: Once | CUTANEOUS | Status: DC
  Administered 2023-08-06: 1 via TRANSDERMAL
  Filled 2023-08-06: qty 1

## 2023-08-06 MED ORDER — CYCLOBENZAPRINE HCL 10 MG PO TABS: 10.0000 mg | ORAL_TABLET | Freq: Two times a day (BID) | ORAL | 0 refills | Status: DC | PRN

## 2023-08-06 MED ORDER — NAPROXEN 500 MG PO TABS: 500.0000 mg | ORAL_TABLET | Freq: Two times a day (BID) | ORAL | 0 refills | Status: DC

## 2023-08-06 MED ORDER — CYCLOBENZAPRINE HCL 10 MG PO TABS
10.0000 mg | ORAL_TABLET | Freq: Once | ORAL | Status: AC
  Administered 2023-08-06: 10 mg via ORAL
  Filled 2023-08-06: qty 1

## 2023-08-06 MED ORDER — KETOROLAC TROMETHAMINE 30 MG/ML IJ SOLN
30.0000 mg | Freq: Once | INTRAMUSCULAR | Status: AC
  Administered 2023-08-06: 30 mg via INTRAMUSCULAR
  Filled 2023-08-06: qty 1

## 2023-08-06 MED ORDER — PREDNISONE 50 MG PO TABS
60.0000 mg | ORAL_TABLET | Freq: Once | ORAL | Status: AC
  Administered 2023-08-06: 60 mg via ORAL
  Filled 2023-08-06: qty 1

## 2023-08-06 MED ORDER — ACETAMINOPHEN 500 MG PO TABS
1000.0000 mg | ORAL_TABLET | Freq: Once | ORAL | Status: AC
  Administered 2023-08-06: 1000 mg via ORAL
  Filled 2023-08-06: qty 2

## 2023-08-06 MED ORDER — METHYLPREDNISOLONE 4 MG PO TBPK
ORAL_TABLET | ORAL | 0 refills | Status: AC
Start: 2023-08-06 — End: 2023-08-12

## 2023-08-06 NOTE — ED Notes (Signed)
Discharge paperwork given and verbally understood. 

## 2023-08-06 NOTE — ED Notes (Signed)
Pts purse found in the room... Pts purse given to sec... Pt called by sec and stated that she would return to pick up purse.Marland KitchenMarland Kitchen

## 2023-08-06 NOTE — ED Triage Notes (Signed)
Pt states lower back pain x months that is worsening and now radiating down legs, pain worse with bending  Taking muscle relaxers with little relief  PT back in July of 2024

## 2023-08-06 NOTE — ED Provider Notes (Signed)
[place] EMERGENCY DEPARTMENT AT [facility] Provider Note   CSN: [id number] Arrival date & time: 08/06/23  1331     History  Chief Complaint  Patient presents with   Back Pain    Brianna Gutierrez is a 47 y.o. female.  Patient is a 47 year old female with a past medical history of hypertension, prediabetes and chronic back pain presenting to the emergency department for acute worsening of her back pain.  The patient states that she has had back pain ongoing for the last several months however over the last 2 days her pain has significantly worsened.  She states it is in her bilateral low back and radiates down her bilateral legs.  She denies any trauma or falls, recent heavy lifting or repetitive activities.  She denies any numbness or weakness, saddle anesthesia, loss of bowel or bladder function.  She denies any dysuria or hematuria.  She denies any prior back surgery or spinal procedures.  She states that she has been taking meloxicam and baclofen at home without significant relief.  The history is provided by the patient.  Back Pain      Home Medications Prior to Admission medications   Medication Sig Start Date End Date Taking? Authorizing Provider  cyclobenzaprine (FLEXERIL) 10 MG tablet Take 1 tablet (10 mg total) by mouth 2 (two) times daily as needed for muscle spasms. 08/06/23  Yes Kingsley, Turkey K, DO  lidocaine (LIDODERM) 5 % Place 1 patch onto the skin daily. Remove & Discard patch within 12 hours or as directed by MD 08/06/23  Yes Theresia Lo, Benetta Spar K, DO  methylPREDNISolone (MEDROL DOSEPAK) 4 MG TBPK tablet Take 6 tablets (24 mg total) by mouth daily for 1 day, THEN 5 tablets (20 mg total) daily for 1 day, THEN 4 tablets (16 mg total) daily for 1 day, THEN 3 tablets (12 mg total) daily for 1 day, THEN 2 tablets (8 mg total) daily for 1 day, THEN 1 tablet (4 mg total) daily for 1 day. 08/06/23 08/12/23 Yes Theresia Lo, Benetta Spar K, DO  naproxen (NAPROSYN) 500  MG tablet Take 1 tablet (500 mg total) by mouth 2 (two) times daily. 08/06/23  Yes Theresia Lo, Marylene Masek K, DO  amLODipine (NORVASC) 10 MG tablet Take 1 tablet (10 mg total) by mouth every evening. 07/08/23 10/06/23  [name], NP  EPINEPHrine (EPIPEN 2-PAK) 0.3 mg/0.3 mL IJ SOAJ injection Inject 0.3 mg into the muscle as needed for anaphylaxis. 03/27/23   [name], NP  gabapentin (NEURONTIN) 300 MG capsule Take 1 capsule by mouth at bedtime.    [provider]  hydrochlorothiazide (HYDRODIURIL) 25 MG tablet Take 1 tablet (25 mg total) by mouth daily. 07/08/23 10/06/23  [name], NP  lamoTRIgine (LAMICTAL) 100 MG tablet Take 1 tablet (100 mg total) by mouth daily. 05/09/23 08/07/23  Stasia Cavalier, MD  metFORMIN (GLUCOPHAGE) 500 MG tablet Take 1 tablet (500 mg total) by mouth 2 (two) times daily with a meal. 07/08/23 10/06/23  [name], NP  methocarbamol (ROBAXIN) 500 MG tablet Take 1 tablet (500 mg total) by mouth 2 (two) times daily. 02/03/23   Prosperi, Christian H, PA-C  triamcinolone ointment (KENALOG) 0.1 % Apply 1 Application topically 2 (two) times daily as needed (rash flare). Do not use on the face, neck, armpits or groin area. Do not use more than 3 weeks in a row. 06/28/23   Ellamae Sia, DO  XOLAIR 150 MG/ML prefilled syringe Inject 300 mg into  the skin every 28 (twenty-eight) days. 08/27/22   [provider]  dicyclomine (BENTYL) 20 MG tablet Take 1 tablet (20 mg total) by mouth 2 (two) times daily. 10/29/19 08/24/20  Belinda Fisher, PA-C  omeprazole (PRILOSEC) 20 MG capsule Take 1 capsule (20 mg total) by mouth daily. 01/01/19 08/07/19  Bing Neighbors, NP      Allergies    Pork-derived products and Tramadol    Review of Systems   Review of Systems  Musculoskeletal:  Positive for back pain.    Physical Exam Updated Vital Signs BP 112/74 (BP Location: Right Arm)   Pulse 79   Temp 97.9 F (36.6 C) (Oral)   Resp 16   Ht 5\' 1"  (1.549 m)   Wt 89 kg   LMP   (LMP Unknown)   SpO2 99%   BMI 37.07 kg/m  Physical Exam Vitals and nursing note reviewed.  Constitutional:      General: She is not in acute distress.    Appearance: Normal appearance.  HENT:     Head: Normocephalic and atraumatic.     Nose: Nose normal.     Mouth/Throat:     Mouth: Mucous membranes are moist.  Eyes:     Extraocular Movements: Extraocular movements intact.  Neck:     Comments: No midline neck tenderness Cardiovascular:     Rate and Rhythm: Normal rate and regular rhythm.     Heart sounds: Normal heart sounds.  Pulmonary:     Effort: Pulmonary effort is normal.     Breath sounds: Normal breath sounds.  Abdominal:     General: Abdomen is flat.     Palpations: Abdomen is soft.     Tenderness: There is no right CVA tenderness, left CVA tenderness or guarding.  Musculoskeletal:        General: Normal range of motion.     Cervical back: Normal range of motion and neck supple.     Comments: No midline back tenderness Bilateral lumbar paraspinal muscle tenderness  Negative straight leg raise bilaterally  Skin:    General: Skin is warm and dry.  Neurological:     General: No focal deficit present.     Mental Status: She is alert and oriented to person, place, and time.  Psychiatric:        Mood and Affect: Mood normal.        Behavior: Behavior normal.     ED Results / Procedures / Treatments   Labs (all labs ordered are listed, but only abnormal results are displayed) Labs Reviewed - No data to display  EKG None  Radiology No results found.  Procedures Procedures    Medications Ordered in ED Medications  lidocaine (LIDODERM) 5 % 1-3 patch (1 patch Transdermal Patch Applied 08/06/23 1639)  predniSONE (DELTASONE) tablet 60 mg (has no administration in time range)  ketorolac (TORADOL) 30 MG/ML injection 30 mg (30 mg Intramuscular Given 08/06/23 1639)  acetaminophen (TYLENOL) tablet 1,000 mg (1,000 mg Oral Given 08/06/23 1642)  cyclobenzaprine  (FLEXERIL) tablet 10 mg (10 mg Oral Given 08/06/23 1642)    ED Course/ Medical Decision Making/ A&P Clinical Course as of 08/06/23 1809  Tue Aug 06, 2023  1803 Patient's pain is improved from about a 10 out of 10 to a 6 out of 10.  She is able to ambulate in the room though still has significant pain in her left lower back with position changes.  She did drive herself here so is unable to  have any further sedating medications.  She is offered steroid taper and recommended close outpatient spine follow-up. [VK]    Clinical Course User Index [VK] Rexford Maus, DO                                 Medical Decision Making This patient presents to the ED with chief complaint(s) of back pain with pertinent past medical history of HTN, pre-diabetes, chronic back pain which further complicates the presenting complaint. The complaint involves an extensive differential diagnosis and also carries with it a high risk of complications and morbidity.    The differential diagnosis includes muscle strain or spasm, negative straight leg raise making sciatica unlikely, no urinary symptoms making pyelo unlikely, no neurologic deficits, saddle anesthesia, loss of bowel or bladder function making cauda equina unlikely, no fever or recent spinal procedure manipulation making epidural abscess unlikely, no trauma making fracture unlikely  Additional history obtained: Additional history obtained from N/A Records reviewed outpatient orthopedic records  ED Course and Reassessment: Patient's arrival she is hemodynamically stable in no acute distress.  She has bilateral lumbar paraspinal muscle tenderness most consistent with musculoskeletal pain.  She will be treated with Tylenol, Toradol, Flexeril and lidocaine patch and will be closely reassessed.  Independent labs interpretation:  N/A  Independent visualization of imaging: - N/A  Consultation: - Consulted or discussed management/test interpretation w/  external professional: N/A  Consideration for admission or further workup: Patient has no emergent conditions requiring admission or further work-up at this time and is stable for discharge home with primary care follow-up  Social Determinants of health: N/A    Risk OTC drugs. Prescription drug management.          Final Clinical Impression(s) / ED Diagnoses Final diagnoses:  Chronic bilateral low back pain without sciatica    Rx / DC Orders ED Discharge Orders          Ordered    naproxen (NAPROSYN) 500 MG tablet  2 times daily        08/06/23 1808    cyclobenzaprine (FLEXERIL) 10 MG tablet  2 times daily PRN        08/06/23 1808    lidocaine (LIDODERM) 5 %  Every 24 hours        08/06/23 1808    methylPREDNISolone (MEDROL DOSEPAK) 4 MG TBPK tablet  Daily        08/06/23 1808              Rexford Maus, DO 08/06/23 1809

## 2023-08-06 NOTE — Discharge Instructions (Signed)
You were seen in the emergency department for worsening of your back pain.  You had no signs of damage to your nerves causing your back pain and its likely due to muscle type of pain.  Switched you from your Mobic and baclofen to naproxen and Flexeril and you can also take Tylenol as needed for pain.  You can use the lidocaine patches, ice or heat.  I have also put you on a steroid taper and you should complete this as prescribed.  You should try to stretch your back and do your normal activities as best you can to prevent your muscles from getting more stiff.  You can follow-up with your back doctor to have your symptoms rechecked and for further management.  You should return to the emergency department if you have numbness or weakness in your legs, you are unable to urinate, you are unable to walk or if you have any other new or concerning symptoms.

## 2023-08-07 ENCOUNTER — Inpatient Hospital Stay: Payer: BC Managed Care – PPO | Attending: Physician Assistant

## 2023-08-07 ENCOUNTER — Inpatient Hospital Stay (HOSPITAL_BASED_OUTPATIENT_CLINIC_OR_DEPARTMENT_OTHER): Payer: BC Managed Care – PPO | Admitting: Physician Assistant

## 2023-08-07 ENCOUNTER — Other Ambulatory Visit: Payer: Self-pay | Admitting: Physician Assistant

## 2023-08-07 VITALS — BP 118/82 | HR 82 | Temp 98.2°F | Resp 18 | Ht 61.0 in | Wt 202.3 lb

## 2023-08-07 DIAGNOSIS — R109 Unspecified abdominal pain: Secondary | ICD-10-CM

## 2023-08-07 DIAGNOSIS — Z79899 Other long term (current) drug therapy: Secondary | ICD-10-CM | POA: Insufficient documentation

## 2023-08-07 DIAGNOSIS — D751 Secondary polycythemia: Secondary | ICD-10-CM | POA: Insufficient documentation

## 2023-08-07 DIAGNOSIS — E611 Iron deficiency: Secondary | ICD-10-CM | POA: Diagnosis not present

## 2023-08-07 DIAGNOSIS — D508 Other iron deficiency anemias: Secondary | ICD-10-CM

## 2023-08-07 DIAGNOSIS — F17210 Nicotine dependence, cigarettes, uncomplicated: Secondary | ICD-10-CM | POA: Diagnosis not present

## 2023-08-07 LAB — CBC WITH DIFFERENTIAL (CANCER CENTER ONLY)
Abs Immature Granulocytes: 0.05 10*3/uL (ref 0.00–0.07)
Basophils Absolute: 0 10*3/uL (ref 0.0–0.1)
Basophils Relative: 0 %
Eosinophils Absolute: 0 10*3/uL (ref 0.0–0.5)
Eosinophils Relative: 0 %
HCT: 42.1 % (ref 36.0–46.0)
Hemoglobin: 13 g/dL (ref 12.0–15.0)
Immature Granulocytes: 0 %
Lymphocytes Relative: 15 %
Lymphs Abs: 1.7 10*3/uL (ref 0.7–4.0)
MCH: 23 pg — ABNORMAL LOW (ref 26.0–34.0)
MCHC: 30.9 g/dL (ref 30.0–36.0)
MCV: 74.4 fL — ABNORMAL LOW (ref 80.0–100.0)
Monocytes Absolute: 1 10*3/uL (ref 0.1–1.0)
Monocytes Relative: 9 %
Neutro Abs: 8.5 10*3/uL — ABNORMAL HIGH (ref 1.7–7.7)
Neutrophils Relative %: 76 %
Platelet Count: 356 10*3/uL (ref 150–400)
RBC: 5.66 MIL/uL — ABNORMAL HIGH (ref 3.87–5.11)
RDW: 15.2 % (ref 11.5–15.5)
WBC Count: 11.2 10*3/uL — ABNORMAL HIGH (ref 4.0–10.5)
nRBC: 0 % (ref 0.0–0.2)

## 2023-08-07 LAB — IRON AND IRON BINDING CAPACITY (CC-WL,HP ONLY)
Iron: 58 ug/dL (ref 28–170)
Saturation Ratios: 13 % (ref 10.4–31.8)
TIBC: 456 ug/dL — ABNORMAL HIGH (ref 250–450)
UIBC: 398 ug/dL (ref 148–442)

## 2023-08-07 LAB — URINALYSIS, COMPLETE (UACMP) WITH MICROSCOPIC
Bilirubin Urine: NEGATIVE
Glucose, UA: NEGATIVE mg/dL
Hgb urine dipstick: NEGATIVE
Ketones, ur: NEGATIVE mg/dL
Leukocytes,Ua: NEGATIVE
Nitrite: NEGATIVE
Protein, ur: NEGATIVE mg/dL
Specific Gravity, Urine: 1.013 (ref 1.005–1.030)
pH: 5 (ref 5.0–8.0)

## 2023-08-07 LAB — FERRITIN: Ferritin: 42 ng/mL (ref 11–307)

## 2023-08-07 NOTE — Progress Notes (Signed)
[facility] Telephone:([phone]) [phone]   [phone]) [phone]  PROGRESS NOTE  Patient Care Team: [name], NP as PCP - General (Nurse Practitioner) [name], NP (Nurse Practitioner)  CHIEF COMPLAINTS/PURPOSE OF CONSULTATION:  Erythrocytosis Microcytosis Iron deficiency without anemia  HISTORY OF PRESENTING ILLNESS:  Brianna Gutierrez 47 y.o. female returns for a follow up for iron deficiency without anemia along with erythrocytosis and microcytosis. She is unaccompanied for this visit.   On exam today, Ms. Brianna Gutierrez reports her energy levels and appetite are overall stable. She reports having new onset left back/flank pain that required her to go to the ED yesterday. She was given naproxen, flexeril and medrol dosepak with minimal improvement. She adds noticing dark urine and increased urinary urgency.  She denies any GI symptoms including nausea, vomiting, diarrhea or constipation. She denies easy bruising or signs of active bleeding. She denies fevers, chills, sweats, shortness of breath, chest pain or cough. She has no other complaints. Rest of the 10 point ROS is below.   MEDICAL HISTORY:  Past Medical History:  Diagnosis Date   Abnormal Pap smear of cervix    Anxiety    Depression    Dysmenorrhea    Eczema    History of COVID-19 10/24/2020   loss of taste and smellbody aches and cough x 4 days all symptoms resolved   History of kidney stones    Hypertension    Pre-diabetes    PTSD (post-traumatic stress disorder)    Rash 08/31/2021   comes and goes on legs saw dermetology no rash now ? food allergy   Sleep apnea    SUI (stress urinary incontinence, female)    Trichomonas infection 02/2021   Uterine fibroid     SURGICAL HISTORY: Past Surgical History:  Procedure Laterality Date   BLADDER SUSPENSION N/A 09/11/2021   Procedure: TRANSVAGINAL TAPE (TVT) PROCEDURE;  Surgeon: Patton Salles, MD;  Location: [facility];   Service: Gynecology;  Laterality: N/A;   CESAREAN SECTION  1997   x 1   CHOLECYSTECTOMY N/A 01/14/2019   Procedure: LAPAROSCOPIC CHOLECYSTECTOMY WITH POSSIBLE  INTRAOPERATIVE CHOLANGIOGRAM;  Surgeon: Darnell Level, MD;  Location: [facility];  Service: General;  Laterality: N/A;   CYSTOSCOPY N/A 09/11/2021   Procedure: Bluford Kaufmann;  Surgeon: Patton Salles, MD;  Location: [facility];  Service: Gynecology;  Laterality: N/A;   CYSTOSCOPY/URETEROSCOPY/HOLMIUM LASER Left 01/31/2022   Procedure: LEFT URETEROSCOPY/HOLMIUM LASER;  Surgeon: Despina Arias, MD;  Location: [facility];  Service: Urology;  Laterality: Left;   DILITATION & CURRETTAGE/HYSTROSCOPY WITH HYDROTHERMAL ABLATION N/A 06/22/2016   Procedure: DILATATION & CURETTAGE/HYSTEROSCOPY WITH HYDROTHERMAL ABLATION;  Surgeon: Brock Bad, MD;  Location: [facility];  Service: Gynecology;  Laterality: N/A;   TOTAL LAPAROSCOPIC HYSTERECTOMY WITH SALPINGECTOMY Bilateral 09/11/2021   Procedure: TOTAL LAPAROSCOPIC HYSTERECTOMY WITH SALPINGECTOMY;  Surgeon: Patton Salles, MD;  Location: [facility];  Service: Gynecology;  Laterality: Bilateral;   TUBAL LIGATION     09-28-2015    SOCIAL HISTORY: Social History   Socioeconomic History   Marital status: Legally Separated    Spouse name: Not on file   Number of children: Not on file   Years of education: Not on file   Highest education level: 12th grade  Occupational History   Not on file  Tobacco Use   Smoking status: Every Day    Current packs/day: 0.50    Average packs/day: 0.5 packs/day  for 20.0 years (10.0 ttl pk-yrs)    Types: Cigarettes    Passive exposure: Never   Smokeless tobacco: Never  Vaping Use   Vaping status: Former  Substance and Sexual Activity   Alcohol use: Not Currently   Drug use: Never   Sexual activity: Not Currently    Partners: Male    Birth control/protection: Surgical    Comment: TLH 09/11/21  Other Topics  Concern   Not on file  Social History Narrative   ** Merged History Encounter **       Social Determinants of Health   Financial Resource Strain: Medium Risk (02/22/2023)   Overall Financial Resource Strain (CARDIA)    Difficulty of Paying Living Expenses: Somewhat hard  Food Insecurity: Food Insecurity Present (02/22/2023)   Hunger Vital Sign    Worried About Running Out of Food in the Last Year: Sometimes true    Ran Out of Food in the Last Year: Sometimes true  Transportation Needs: No Transportation Needs (02/22/2023)   PRAPARE - Administrator, Civil Service (Medical): No    Lack of Transportation (Non-Medical): No  Physical Activity: Inactive (02/22/2023)   Exercise Vital Sign    Days of Exercise per Week: 1 day    Minutes of Exercise per Session: 0 min  Stress: Stress Concern Present (02/22/2023)   [employer] of Occupational Health - Occupational Stress Questionnaire    Feeling of Stress : Very much  Social Connections: Socially Isolated (02/22/2023)   Social Connection and Isolation Panel [NHANES]    Frequency of Communication with Friends and Family: More than three times a week    Frequency of Social Gatherings with Friends and Family: Once a week    Attends Religious Services: Never    Database administrator or Organizations: No    Attends Engineer, structural: Not on file    Marital Status: Separated  Intimate Partner Violence: Not on file    FAMILY HISTORY: Family History  Problem Relation Age of Onset   Colon polyps Sister    Hypertension Maternal Grandmother    Diabetes Maternal Grandfather    Allergic rhinitis Daughter    Allergic rhinitis Daughter    Allergic rhinitis Son    Allergic rhinitis Son    Colon cancer Neg Hx    Esophageal cancer Neg Hx    Rectal cancer Neg Hx    Stomach cancer Neg Hx     ALLERGIES:  is allergic to pork-derived products and tramadol.  MEDICATIONS:  Current Outpatient Medications  Medication Sig  Dispense Refill   amLODipine (NORVASC) 10 MG tablet Take 1 tablet (10 mg total) by mouth every evening. 90 tablet 0   cyclobenzaprine (FLEXERIL) 10 MG tablet Take 1 tablet (10 mg total) by mouth 2 (two) times daily as needed for muscle spasms. 20 tablet 0   EPINEPHrine (EPIPEN 2-PAK) 0.3 mg/0.3 mL IJ SOAJ injection Inject 0.3 mg into the muscle as needed for anaphylaxis. 2 each 2   gabapentin (NEURONTIN) 300 MG capsule Take 1 capsule by mouth at bedtime.     hydrochlorothiazide (HYDRODIURIL) 25 MG tablet Take 1 tablet (25 mg total) by mouth daily. 90 tablet 0   lamoTRIgine (LAMICTAL) 100 MG tablet Take 1 tablet (100 mg total) by mouth daily. 90 tablet 0   lidocaine (LIDODERM) 5 % Place 1 patch onto the skin daily. Remove & Discard patch within 12 hours or as directed by MD 30 patch 0   metFORMIN (GLUCOPHAGE) 500 MG  tablet Take 1 tablet (500 mg total) by mouth 2 (two) times daily with a meal. 180 tablet 0   methocarbamol (ROBAXIN) 500 MG tablet Take 1 tablet (500 mg total) by mouth 2 (two) times daily. 20 tablet 0   methylPREDNISolone (MEDROL DOSEPAK) 4 MG TBPK tablet Take 6 tablets (24 mg total) by mouth daily for 1 day, THEN 5 tablets (20 mg total) daily for 1 day, THEN 4 tablets (16 mg total) daily for 1 day, THEN 3 tablets (12 mg total) daily for 1 day, THEN 2 tablets (8 mg total) daily for 1 day, THEN 1 tablet (4 mg total) daily for 1 day. 21 tablet 0   naproxen (NAPROSYN) 500 MG tablet Take 1 tablet (500 mg total) by mouth 2 (two) times daily. 30 tablet 0   triamcinolone ointment (KENALOG) 0.1 % Apply 1 Application topically 2 (two) times daily as needed (rash flare). Do not use on the face, neck, armpits or groin area. Do not use more than 3 weeks in a row. 30 g 0   XOLAIR 150 MG/ML prefilled syringe Inject 300 mg into the skin every 28 (twenty-eight) days.     Current Facility-Administered Medications  Medication Dose Route Frequency Provider Last Rate Last Admin   omalizumab Geoffry Paradise)  prefilled syringe 300 mg  300 mg Subcutaneous Q28 days Nehemiah Settle, FNP   300 mg at 07/23/23 1420    REVIEW OF SYSTEMS:   Constitutional: ( - ) fevers, ( - )  chills , ( - ) night sweats Eyes: ( - ) blurriness of vision, ( - ) double vision, ( - ) watery eyes Ears, nose, mouth, throat, and face: ( - ) mucositis, ( - ) sore throat Respiratory: ( - ) cough, ( - ) dyspnea, ( - ) wheezes Cardiovascular: ( - ) palpitation, ( - ) chest discomfort, ( - ) lower extremity swelling Gastrointestinal:  ( - ) nausea, ( - ) heartburn, ( - ) change in bowel habits Skin: ( - ) abnormal skin rashes Lymphatics: ( - ) new lymphadenopathy, ( - ) easy bruising Neurological: ( - ) numbness, ( - ) tingling, ( - ) new weaknesses Behavioral/Psych: ( - ) mood change, ( - ) new changes  All other systems were reviewed with the patient and are negative.  PHYSICAL EXAMINATION: ECOG PERFORMANCE STATUS: 0 - Asymptomatic  Vitals:   08/07/23 1105  BP: 118/82  Pulse: 82  Resp: 18  Temp: 98.2 F (36.8 C)  SpO2: 100%   Filed Weights   08/07/23 1105  Weight: 202 lb 4.8 oz (91.8 kg)    GENERAL: well appearing female in NAD  SKIN: skin color, texture, turgor are normal, no rashes or significant lesions EYES: conjunctiva are pink and non-injected, sclera clear LUNGS: clear to auscultation and percussion with normal breathing effort HEART: regular rate & rhythm and no murmurs and no lower extremity edema Musculoskeletal: no cyanosis of digits and no clubbing  PSYCH: alert & oriented x 3, fluent speech NEURO: no focal motor/sensory deficits  LABORATORY DATA:  I have reviewed the data as listed    Latest Ref Rng & Units 08/07/2023   10:28 AM 02/06/2023   10:17 AM 02/03/2023    7:45 PM  CBC  WBC 4.0 - 10.5 K/uL 11.2  6.7  7.0   Hemoglobin 12.0 - 15.0 g/dL 16.1  09.6  04.5   Hematocrit 36.0 - 46.0 % 42.1  43.1  43.7   Platelets 150 - 400 K/uL 356  338  363        Latest Ref Rng & Units 07/08/2023    4:19  PM 03/26/2023    8:46 PM 02/06/2023   10:17 AM  CMP  Glucose 70 - 99 mg/dL 161  096  92   BUN 6 - 24 mg/dL 11  20  12    Creatinine 0.57 - 1.00 mg/dL 0.45  4.09  8.11   Sodium 134 - 144 mmol/L 142  138  138   Potassium 3.5 - 5.2 mmol/L 3.4  3.6  4.5   Chloride 96 - 106 mmol/L 103  98  105   CO2 20 - 29 mmol/L 25  24  29    Calcium 8.7 - 10.2 mg/dL 9.2  9.7  9.1   Total Protein 6.5 - 8.1 g/dL   7.6   Total Bilirubin 0.3 - 1.2 mg/dL   0.3   Alkaline Phos 38 - 126 U/L   102   AST 15 - 41 U/L   9   ALT 0 - 44 U/L   13     RADIOGRAPHIC STUDIES: I have personally reviewed the radiological images as listed and agreed with the findings in the report. No results found.  ASSESSMENT & PLAN Alianah Deshazor Brianna Gutierrez is a 47 y.o. female who presents to the clinic for evaluation of erythrocytosis and microcytosis.  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  --No intervention required at this time.   #Iron deficiency without anemia: --Workup from 02/06/2023 showed deficiency with ferritin 19. Patient did not start PO iron therapy at that time --EGD from 07/03/2023 showed gastritis but no other abnormalities detected.  --Labs today show ferritin 42, saturation 13%, TIBC 456.  --Advised to take OTC iron supplementation daily.   #Left flank/bank pain: --Was evaluated by ED yesterday and started on naprosyn, flexeril and medrol dosepak. --Pain has minimally improved.  --Will check UA/culture to rule out UTI.   Follow up: --RTC in 1 year with labs.    Orders Placed This Encounter  Procedures   Culture, Urine    Standing Status:   Future    Number of Occurrences:   1    Standing Expiration Date:   08/06/2024   Urinalysis, Complete w Microscopic    Standing Status:   Future    Number of Occurrences:   1    Standing Expiration Date:   08/06/2024    All questions were answered. The patient knows to call the clinic with any problems, questions or concerns.  I have  spent a total of 30 minutes minutes of face-to-face and non-face-to-face time, preparing to see the patient,  performing a medically appropriate examination, counseling and educating the patient, ordering tests/procedures,  documenting clinical information in the electronic health record, and care coordination.   Georga Kaufmann, PA-C Department of Hematology/Oncology [facility] at [facility] Phone: [phone]
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
## 2023-08-08 ENCOUNTER — Telehealth (HOSPITAL_COMMUNITY): Payer: Self-pay | Admitting: Psychiatry

## 2023-08-08 ENCOUNTER — Telehealth: Payer: Self-pay | Admitting: Physical Medicine and Rehabilitation

## 2023-08-08 LAB — URINE CULTURE

## 2023-08-08 NOTE — Telephone Encounter (Signed)
I have patient scheduled for OV on 08/13/23 at 4 pm

## 2023-08-08 NOTE — Telephone Encounter (Signed)
Pt called requesting an appt for 08/13/23 @ 4:00pm

## 2023-08-09 ENCOUNTER — Telehealth (HOSPITAL_COMMUNITY): Payer: BC Managed Care – PPO | Admitting: Psychiatry

## 2023-08-13 ENCOUNTER — Inpatient Hospital Stay: Payer: BC Managed Care – PPO

## 2023-08-13 ENCOUNTER — Ambulatory Visit: Payer: BC Managed Care – PPO | Admitting: Physical Medicine and Rehabilitation

## 2023-08-13 ENCOUNTER — Telehealth: Payer: Self-pay

## 2023-08-13 ENCOUNTER — Ambulatory Visit: Payer: BC Managed Care – PPO | Admitting: *Deleted

## 2023-08-13 ENCOUNTER — Other Ambulatory Visit: Payer: Self-pay

## 2023-08-13 ENCOUNTER — Encounter: Payer: Self-pay | Admitting: Physical Medicine and Rehabilitation

## 2023-08-13 DIAGNOSIS — M7918 Myalgia, other site: Secondary | ICD-10-CM | POA: Diagnosis not present

## 2023-08-13 DIAGNOSIS — D751 Secondary polycythemia: Secondary | ICD-10-CM | POA: Diagnosis not present

## 2023-08-13 DIAGNOSIS — Z79899 Other long term (current) drug therapy: Secondary | ICD-10-CM | POA: Diagnosis not present

## 2023-08-13 DIAGNOSIS — F17210 Nicotine dependence, cigarettes, uncomplicated: Secondary | ICD-10-CM | POA: Diagnosis not present

## 2023-08-13 DIAGNOSIS — M5416 Radiculopathy, lumbar region: Secondary | ICD-10-CM | POA: Diagnosis not present

## 2023-08-13 DIAGNOSIS — G8929 Other chronic pain: Secondary | ICD-10-CM

## 2023-08-13 DIAGNOSIS — M5441 Lumbago with sciatica, right side: Secondary | ICD-10-CM | POA: Diagnosis not present

## 2023-08-13 DIAGNOSIS — R109 Unspecified abdominal pain: Secondary | ICD-10-CM

## 2023-08-13 DIAGNOSIS — E611 Iron deficiency: Secondary | ICD-10-CM | POA: Diagnosis not present

## 2023-08-13 DIAGNOSIS — M5442 Lumbago with sciatica, left side: Secondary | ICD-10-CM

## 2023-08-13 LAB — URINALYSIS, COMPLETE (UACMP) WITH MICROSCOPIC
Bacteria, UA: NONE SEEN
Bilirubin Urine: NEGATIVE
Glucose, UA: NEGATIVE mg/dL
Hgb urine dipstick: NEGATIVE
Ketones, ur: NEGATIVE mg/dL
Leukocytes,Ua: NEGATIVE
Nitrite: NEGATIVE
Protein, ur: NEGATIVE mg/dL
Specific Gravity, Urine: 1.017 (ref 1.005–1.030)
pH: 7 (ref 5.0–8.0)

## 2023-08-13 NOTE — Telephone Encounter (Signed)
Pt advised of UA results and recommendations.  Appt today for repeat urine check

## 2023-08-13 NOTE — Progress Notes (Unsigned)
Brianna Gutierrez - 47 y.o. female MRN [id number]  Date of birth: 1976/03/24  Office Visit Note: Visit Date: 08/13/2023 PCP: [name], NP Referred by: [name], NP  Subjective: Chief Complaint  Patient presents with  . Lower Back - Pain   HPI: Brianna Gutierrez is a 47 y.o. female who comes in today for evaluation of chronic, worsening and severe bilateral lower back pain radiating to buttocks and down lateral legs to feet. We initially treated her in May for more bilateral axial back pain. Radicular symptoms started over the last 2 months. Her pain worsens with movement and activity, her discomfort is also causing issues sleeping at night. Brianna Gutierrez describes pain as sore and aching sensation, currently rates as 8 out of 10. Some relief of pain with formal physical therapy and medications. States her pain has gradually increased over the last several months. Brianna Gutierrez was recently evaluated in the emergency department on 08/06/2023, discharged home with prescriptions for Lidocaine patches, oral Prednisone and Flexeril. Lumbar x-rays from 2021 exhibit normal anatomical alignment, no spondylolisthesis, degenerative changes noted to lower lumbar spine. Patient currently working at [employer]. Patient denies focal weakness, numbness and tingling. No recent trauma or falls.   Review of Systems  Musculoskeletal:  Positive for back pain.  Neurological:  Negative for tingling, sensory change, focal weakness and weakness.  All other systems reviewed and are negative.  Otherwise per HPI.  Assessment & Plan: Visit Diagnoses:    ICD-10-CM   1. Chronic bilateral low back pain with bilateral sciatica  M54.42 MR LUMBAR SPINE WO CONTRAST   M54.41    G89.29     2. Lumbar radiculopathy  M54.16 MR LUMBAR SPINE WO CONTRAST    3. Myofascial pain syndrome  M79.18 MR LUMBAR SPINE WO CONTRAST       Plan: Findings:  Chronic, worsening and severe bilateral lower back pain radiating to buttocks down  lateral legs to feet. Patient continues to have severe pain despite good conservative therapies such as formal physical therapy, home exercise regimen, rest and use of medications. Patients clinical presentation and exam are consistent with L5 nerve pattern. Next step is to place order for lumbar MRI imaging. Depending on results of MRI imaging we discussed possibility of performing lumbar epidural steroid injection. I encouraged patient to continue with PT exercises at home, can also continue with current medication regimen. No red flag symptoms noted upon exam today.     Meds & Orders: No orders of the defined types were placed in this encounter.   Orders Placed This Encounter  Procedures  . MR LUMBAR SPINE WO CONTRAST    Follow-up: Return for lumbar MRI review.   Procedures: No procedures performed      Clinical History: No specialty comments available.   Brianna Gutierrez reports that Brianna Gutierrez has been smoking cigarettes. Brianna Gutierrez has a 10 pack-year smoking history. Brianna Gutierrez has never been exposed to tobacco smoke. Brianna Gutierrez has never used smokeless tobacco.  Recent Labs    07/08/23 1619  HGBA1C 6.4*    Objective:  VS:  HT:    WT:   BMI:     BP:   HR: bpm  TEMP: ( )  RESP:  Physical Exam Vitals and nursing note reviewed.  HENT:     Head: Normocephalic and atraumatic.     Right Ear: External ear normal.     Left Ear: External ear normal.     Nose: Nose normal.     Mouth/Throat:  Mouth: Mucous membranes are moist.  Eyes:     Extraocular Movements: Extraocular movements intact.  Cardiovascular:     Rate and Rhythm: Normal rate.     Pulses: Normal pulses.  Pulmonary:     Effort: Pulmonary effort is normal.  Abdominal:     General: Abdomen is flat. There is no distension.  Musculoskeletal:        General: Tenderness present.     Cervical back: Normal range of motion.     Comments: Patient rises from seated position to standing without difficulty. Good lumbar range of motion. No pain noted with  facet loading. 5/5 strength noted with bilateral hip flexion, knee flexion/extension, ankle dorsiflexion/plantarflexion and EHL. No clonus noted bilaterally. No pain upon palpation of greater trochanters. No pain with internal/external rotation of bilateral hips. Sensation intact bilaterally. Dysesthesias noted to bilateral L5 dermatomes. Negative slump test bilaterally. Ambulates without aid, gait steady.     Skin:    General: Skin is warm and dry.     Capillary Refill: Capillary refill takes less than 2 seconds.  Neurological:     General: No focal deficit present.     Mental Status: Brianna Gutierrez is alert and oriented to person, place, and time.  Psychiatric:        Mood and Affect: Mood normal.        Behavior: Behavior normal.    Ortho Exam  Imaging: No results found.  Past Medical/Family/Surgical/Social History: Medications & Allergies reviewed per EMR, new medications updated. Patient Active Problem List   Diagnosis Date Noted  . Rash and other nonspecific skin eruption 05/08/2023  . Other adverse food reactions, not elsewhere classified, subsequent encounter 09/19/2022  . Pruritus 08/15/2022  . Essential hypertension 04/30/2022  . Shortness of breath 04/19/2022  . Other allergic rhinitis 04/19/2022  . Chronic idiopathic urticaria 04/19/2022  . Pulmonary embolism (HCC) 11/30/2021  . Depression 11/15/2021  . 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 Smoking 10/26/2021  . Postoperative intra-abdominal abscess (HCC)   . Medication monitoring encounter   . Pelvic infection in female 09/24/2021  . Status post laparoscopic hysterectomy 09/11/2021  . Cholelithiasis 01/13/2019  . Pre-diabetes 11/15/2016  . History of bilateral tubal ligation 09/28/2015  . Tobacco abuse 09/28/2015   Past Medical History:  Diagnosis Date  . Abnormal Pap smear of cervix   . Anxiety   . Depression   . Dysmenorrhea   . Eczema   . History of COVID-19 10/24/2020   loss of taste and  smellbody aches and cough x 4 days all symptoms resolved  . History of kidney stones   . Hypertension   . Pre-diabetes   . PTSD (post-traumatic stress disorder)   . Rash 08/31/2021   comes and goes on legs saw dermetology no rash now ? food allergy  . Sleep apnea   . SUI (stress urinary incontinence, female)   . Trichomonas infection 02/2021  . Uterine fibroid    Family History  Problem Relation Age of Onset  . Colon polyps Sister   . Hypertension Maternal Grandmother   . Diabetes Maternal Grandfather   . Allergic rhinitis Daughter   . Allergic rhinitis Daughter   . Allergic rhinitis Son   . Allergic rhinitis Son   . Colon cancer Neg Hx   . Esophageal cancer Neg Hx   . Rectal cancer Neg Hx   . Stomach cancer Neg Hx    Past Surgical History:  Procedure Laterality Date  . BLADDER SUSPENSION N/A 09/11/2021  Procedure: TRANSVAGINAL TAPE (TVT) PROCEDURE;  Surgeon: Patton Salles, MD;  Location: [facility];  Service: Gynecology;  Laterality: N/A;  . CESAREAN SECTION  1997   x 1  . CHOLECYSTECTOMY N/A 01/14/2019   Procedure: LAPAROSCOPIC CHOLECYSTECTOMY WITH POSSIBLE  INTRAOPERATIVE CHOLANGIOGRAM;  Surgeon: Darnell Level, MD;  Location: [facility];  Service: General;  Laterality: N/A;  . CYSTOSCOPY N/A 09/11/2021   Procedure: CYSTOSCOPY;  Surgeon: Patton Salles, MD;  Location: [facility];  Service: Gynecology;  Laterality: N/A;  . CYSTOSCOPY/URETEROSCOPY/HOLMIUM LASER Left 01/31/2022   Procedure: LEFT URETEROSCOPY/HOLMIUM LASER;  Surgeon: Despina Arias, MD;  Location: [facility];  Service: Urology;  Laterality: Left;  . DILITATION & CURRETTAGE/HYSTROSCOPY WITH HYDROTHERMAL ABLATION N/A 06/22/2016   Procedure: DILATATION & CURETTAGE/HYSTEROSCOPY WITH HYDROTHERMAL ABLATION;  Surgeon: Brock Bad, MD;  Location: [facility];  Service: Gynecology;  Laterality: N/A;  . TOTAL LAPAROSCOPIC HYSTERECTOMY WITH SALPINGECTOMY Bilateral 09/11/2021    Procedure: TOTAL LAPAROSCOPIC HYSTERECTOMY WITH SALPINGECTOMY;  Surgeon: Patton Salles, MD;  Location[facility];  Service: Gynecology;  Laterality: Bilateral;  . TUBAL LIGATION     09-28-2015   Social History   Occupational History  . Not on file  Tobacco Use  . Smoking status: Every Day    Current packs/day: 0.50    Average packs/day: 0.5 packs/day for 20.0 years (10.0 ttl pk-yrs)    Types: Cigarettes    Passive exposure: Never  . Smokeless tobacco: Never  Vaping Use  . Vaping status: Former  Substance and Sexual Activity  . Alcohol use: Not Currently  . Drug use: Never  . Sexual activity: Not Currently    Partners: Male    Birth control/protection: Surgical    Comment: [facility] 09/11/21

## 2023-08-13 NOTE — Progress Notes (Unsigned)
Functional Pain Scale - descriptive words and definitions  Unmanageable (7)  Pain interferes with normal ADL's/nothing seems to help/sleep is very difficult/active distractions are very difficult to concentrate on. Severe range order  Average Pain 7  Lower back pain on both sides that radiates into both legs. Standing makes pain worse

## 2023-08-13 NOTE — Telephone Encounter (Signed)
-----   Message from Briant Cedar sent at 08/12/2023  9:54 AM EDT ----- Can you let the patient know that urine culture showed multiple species present which could be a bad sample versus UTI. Can she return for a repeat urine check? ----- Message ----- From: Interface, Lab In [place] Sent: 08/07/2023  10:59 AM EDT To: Briant Cedar, PA-C

## 2023-08-14 LAB — URINE CULTURE: Culture: 10000 — AB

## 2023-08-19 ENCOUNTER — Telehealth: Payer: Self-pay

## 2023-08-19 NOTE — Telephone Encounter (Signed)
LM for pt that UA was negative for UTI

## 2023-08-19 NOTE — Telephone Encounter (Signed)
-----   Message from Briant Cedar sent at 08/19/2023  6:52 AM EDT ----- No definitive evidence of UTI. Please notify patient. ----- Message ----- From: Leory Plowman, Lab In [place] Sent: 08/13/2023   5:18 PM EDT To: Briant Cedar, PA-C

## 2023-08-27 ENCOUNTER — Other Ambulatory Visit: Payer: Self-pay | Admitting: Allergy
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
## 2023-08-29 ENCOUNTER — Ambulatory Visit (INDEPENDENT_AMBULATORY_CARE_PROVIDER_SITE_OTHER): Payer: BC Managed Care – PPO

## 2023-08-29 ENCOUNTER — Encounter (HOSPITAL_COMMUNITY): Payer: Self-pay | Admitting: Psychiatry

## 2023-08-29 ENCOUNTER — Telehealth (HOSPITAL_BASED_OUTPATIENT_CLINIC_OR_DEPARTMENT_OTHER): Payer: BC Managed Care – PPO | Admitting: Psychiatry

## 2023-08-29 DIAGNOSIS — F4310 Post-traumatic stress disorder, unspecified: Secondary | ICD-10-CM

## 2023-08-29 DIAGNOSIS — F321 Major depressive disorder, single episode, moderate: Secondary | ICD-10-CM

## 2023-08-29 DIAGNOSIS — F331 Major depressive disorder, recurrent, moderate: Secondary | ICD-10-CM | POA: Diagnosis not present

## 2023-08-29 DIAGNOSIS — L501 Idiopathic urticaria: Secondary | ICD-10-CM | POA: Diagnosis not present

## 2023-08-29 DIAGNOSIS — Z91018 Allergy to other foods: Secondary | ICD-10-CM

## 2023-08-29 DIAGNOSIS — L309 Dermatitis, unspecified: Secondary | ICD-10-CM

## 2023-08-29 MED ORDER — TRAZODONE HCL 100 MG PO TABS
100.0000 mg | ORAL_TABLET | Freq: Every day | ORAL | 0 refills | Status: DC
Start: 2023-08-29 — End: 2024-01-30

## 2023-08-29 MED ORDER — LAMOTRIGINE 100 MG PO TABS: 100.0000 mg | ORAL_TABLET | Freq: Every day | ORAL | 0 refills | Status: DC

## 2023-08-29 MED ORDER — EPINEPHRINE 0.3 MG/0.3ML IJ SOAJ: 0.3000 mg | INTRAMUSCULAR | 2 refills | Status: DC | PRN

## 2023-09-04 ENCOUNTER — Encounter: Payer: Self-pay | Admitting: Physical Medicine and Rehabilitation

## 2023-09-04 ENCOUNTER — Telehealth: Payer: Self-pay

## 2023-09-04 NOTE — Telephone Encounter (Signed)
Transition Care Management Unsuccessful Follow-up Telephone Call  Date of discharge and from where:  08/06/2023 [facility]  Attempts:  1st Attempt  Reason for unsuccessful TCM follow-up call:  Left voice message  Ailyne Pawley [facility]  [facility], [facility] Guide Direct Dial: [phone]  Website: [email or web address]

## 2023-09-06 ENCOUNTER — Telehealth: Payer: Self-pay

## 2023-09-06 NOTE — Telephone Encounter (Signed)
Transition Care Management Unsuccessful Follow-up Telephone Call  Date of discharge and from where:  08/06/2023 [facility]  Attempts:  2nd Attempt  Reason for unsuccessful TCM follow-up call:  Left voice message  Lacorey Brusca [facility]  [facility], [facility] Guide Direct Dial: [phone]  Website: [email or web address]

## 2023-09-07 ENCOUNTER — Other Ambulatory Visit: Payer: BC Managed Care – PPO

## 2023-09-17 ENCOUNTER — Encounter: Payer: Self-pay | Admitting: Family

## 2023-09-18 NOTE — Telephone Encounter (Signed)
Schedule appointment?

## 2023-09-23 DIAGNOSIS — G4733 Obstructive sleep apnea (adult) (pediatric): Secondary | ICD-10-CM | POA: Diagnosis not present

## 2023-10-03 ENCOUNTER — Ambulatory Visit: Payer: BC Managed Care – PPO | Admitting: *Deleted

## 2023-10-03 DIAGNOSIS — L501 Idiopathic urticaria: Secondary | ICD-10-CM | POA: Diagnosis not present

## 2023-10-05 ENCOUNTER — Other Ambulatory Visit: Payer: BC Managed Care – PPO

## 2023-10-05 ENCOUNTER — Inpatient Hospital Stay
Admission: RE | Admit: 2023-10-05 | Discharge: 2023-10-05 | Discharge status: Home / Self Care | Payer: BC Managed Care – PPO | Source: Ambulatory Visit | Attending: Physical Medicine and Rehabilitation | Admitting: Physical Medicine and Rehabilitation

## 2023-10-05 DIAGNOSIS — M5416 Radiculopathy, lumbar region: Secondary | ICD-10-CM

## 2023-10-05 DIAGNOSIS — M47816 Spondylosis without myelopathy or radiculopathy, lumbar region: Secondary | ICD-10-CM | POA: Diagnosis not present

## 2023-10-05 DIAGNOSIS — M7918 Myalgia, other site: Secondary | ICD-10-CM

## 2023-10-05 DIAGNOSIS — G8929 Other chronic pain: Secondary | ICD-10-CM

## 2023-10-07 ENCOUNTER — Encounter (INDEPENDENT_AMBULATORY_CARE_PROVIDER_SITE_OTHER): Payer: BC Managed Care – PPO | Admitting: Family

## 2023-10-07 NOTE — Progress Notes (Signed)
Erroneous encounter-disregard

## 2023-10-09 ENCOUNTER — Ambulatory Visit (INDEPENDENT_AMBULATORY_CARE_PROVIDER_SITE_OTHER): Payer: BC Managed Care – PPO | Admitting: Family

## 2023-10-09 ENCOUNTER — Encounter: Payer: Self-pay | Admitting: Family

## 2023-10-09 VITALS — BP 104/73 | HR 89 | Temp 98.9°F | Ht 61.0 in | Wt 204.0 lb

## 2023-10-09 DIAGNOSIS — N951 Menopausal and female climacteric states: Secondary | ICD-10-CM | POA: Diagnosis not present

## 2023-10-09 DIAGNOSIS — R7303 Prediabetes: Secondary | ICD-10-CM

## 2023-10-09 DIAGNOSIS — I10 Essential (primary) hypertension: Secondary | ICD-10-CM | POA: Diagnosis not present

## 2023-10-09 DIAGNOSIS — G629 Polyneuropathy, unspecified: Secondary | ICD-10-CM

## 2023-10-09 DIAGNOSIS — F17210 Nicotine dependence, cigarettes, uncomplicated: Secondary | ICD-10-CM

## 2023-10-09 DIAGNOSIS — Z7984 Long term (current) use of oral hypoglycemic drugs: Secondary | ICD-10-CM

## 2023-10-09 DIAGNOSIS — Z13220 Encounter for screening for lipoid disorders: Secondary | ICD-10-CM

## 2023-10-09 MED ORDER — AMLODIPINE BESYLATE 10 MG PO TABS: 10.0000 mg | ORAL_TABLET | Freq: Every evening | ORAL | 0 refills | Status: DC

## 2023-10-09 MED ORDER — GABAPENTIN 300 MG PO CAPS: 300.0000 mg | ORAL_CAPSULE | Freq: Every day | ORAL | 0 refills | Status: DC

## 2023-10-09 MED ORDER — METFORMIN HCL 500 MG PO TABS: 500.0000 mg | ORAL_TABLET | Freq: Two times a day (BID) | ORAL | 0 refills | Status: DC

## 2023-10-09 MED ORDER — HYDROCHLOROTHIAZIDE 25 MG PO TABS: 25.0000 mg | ORAL_TABLET | Freq: Every day | ORAL | 0 refills | Status: DC

## 2023-10-09 MED ORDER — GABAPENTIN 300 MG PO CAPS
300.0000 mg | ORAL_CAPSULE | Freq: Every day | ORAL | 0 refills | Status: DC
Start: 2023-10-09 — End: 2024-01-08

## 2023-10-09 MED ORDER — METFORMIN HCL 500 MG PO TABS
500.0000 mg | ORAL_TABLET | Freq: Two times a day (BID) | ORAL | 0 refills | Status: DC
Start: 2023-10-09 — End: 2024-01-08

## 2023-10-09 MED ORDER — PAROXETINE MESYLATE 7.5 MG PO CAPS
7.5000 mg | ORAL_CAPSULE | Freq: Every evening | ORAL | 1 refills | Status: DC | PRN
Start: 2023-10-09 — End: 2024-01-08

## 2023-10-09 NOTE — Progress Notes (Signed)
Patient ID: Brianna Gutierrez, female    DOB: [birth year]  MRN: [id number]  CC: Chronic Conditions Follow-Up  Subjective: Brianna Gutierrez is a 47 y.o. female who presents for chronic conditions follow-up.   Her concerns today include:  - Doing well on Amlodipine and Hydrochlorothiazide, no issues/concerns. She does not complain of red flag symptoms such as but not limited to chest pain, shortness of breath, worst headache of life, nausea/vomiting.  - Doing well on Metformin, no issues/concerns.  - Needs refills of Gabapentin, no issues/concerns.  - Hot flashes. Reports history of partial hysterectomy 2 years ago. Established with Gynecology.   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 Take an additional 50 mg as needed if not asleep in 30 minutes 135 tablet 0   XOLAIR 150 MG/ML prefilled syringe INJECT 300MG  SUBCUTANEOUSLY  EVERY 4 WEEKS 2 mL 11   EPINEPHrine (EPIPEN 2-PAK) 0.3 mg/0.3 mL IJ SOAJ injection Inject 0.3 mg into the muscle as needed  for anaphylaxis. 2 each 2   lamoTRIgine (LAMICTAL) 100 MG tablet Take 1 tablet (100 mg total) by mouth daily. 90 tablet 0   lidocaine (LIDODERM) 5 % Place 1 patch onto the skin daily. Remove & Discard patch within 12 hours or as directed by MD (Patient not taking: Reported on 10/09/2023) 30 patch 0   methocarbamol (ROBAXIN) 500 MG tablet Take 1 tablet (500 mg total) by mouth 2 (two) times daily. (Patient not taking: Reported on 10/09/2023) 20 tablet 0   naproxen (NAPROSYN) 500 MG tablet Take 1 tablet (500 mg total) by mouth 2 (two) times daily. (Patient not taking: Reported on 10/09/2023) 30 tablet 0   triamcinolone ointment (KENALOG) 0.1 % Apply 1 Application topically 2 (two) times daily as needed (rash flare). Do not use on the face, neck, armpits or groin area. Do not use more than 3 weeks in a row. (Patient not taking: Reported on 10/09/2023) 30 g 0   [DISCONTINUED] dicyclomine (BENTYL) 20 MG tablet Take 1 tablet (20 mg total) by mouth 2 (two) times daily. 20 tablet 0   [DISCONTINUED] omeprazole (PRILOSEC) 20 MG capsule Take 1 capsule (20 mg total) by mouth daily. 30 capsule 1   Current Facility-Administered Medications on File Prior to Visit  Medication Dose Route Frequency Provider Last Rate Last Admin   omalizumab Geoffry Paradise) prefilled syringe 300 mg  300 mg Subcutaneous Q28 days Nehemiah Settle, FNP   300 mg at 10/03/23 1456    Allergies  Allergen Reactions   Pork-Derived Products    Tramadol     rash    Social History  Socioeconomic History   Marital status: Legally Separated    Spouse name: Not on file   Number of children: Not on file   Years of education: Not on file   Highest education level: 12th grade  Occupational History   Not on file  Tobacco Use   Smoking status: Every Day    Current packs/day: 0.50    Average packs/day: 0.5 packs/day for 20.0 years (10.0 ttl pk-yrs)    Types: Cigarettes    Passive exposure: Never   Smokeless tobacco: Never  Vaping Use    Vaping status: Former  Substance and Sexual Activity   Alcohol use: Not Currently   Drug use: Never   Sexual activity: Not Currently    Partners: Male    Birth control/protection: Surgical    Comment: TLH 09/11/21  Other Topics Concern   Not on file  Social History Narrative   ** Merged History Encounter **       Social Determinants of Health   Financial Resource Strain: Medium Risk (10/09/2023)   Overall Financial Resource Strain (CARDIA)    Difficulty of Paying Living Expenses: Somewhat hard  Food Insecurity: Food Insecurity Present (10/09/2023)   Hunger Vital Sign    Worried About Running Out of Food in the Last Year: Sometimes true    Ran Out of Food in the Last Year: Often true  Transportation Needs: Unmet Transportation Needs (10/09/2023)   PRAPARE - Administrator, Civil Service (Medical): Yes    Lack of Transportation (Non-Medical): Yes  Physical Activity: Insufficiently Active (10/09/2023)   Exercise Vital Sign    Days of Exercise per Week: 1 day    Minutes of Exercise per Session: 10 min  Stress: No Stress Concern Present (10/09/2023)   [employer] of Occupational Health - Occupational Stress Questionnaire    Feeling of Stress : Only a little  Social Connections: Moderately Isolated (10/09/2023)   Social Connection and Isolation Panel [NHANES]    Frequency of Communication with Friends and Family: More than three times a week    Frequency of Social Gatherings with Friends and Family: Twice a week    Attends Religious Services: 1 to 4 times per year    Active Member of [employer] or Organizations: No    Attends Engineer, structural: Not on file    Marital Status: Separated  Intimate Partner Violence: Not on file    Family History  Problem Relation Age of Onset   Colon polyps Sister    Hypertension Maternal Grandmother    Diabetes Maternal Grandfather    Allergic rhinitis Daughter    Allergic rhinitis Daughter    Allergic rhinitis Son     Allergic rhinitis Son    Colon cancer Neg Hx    Esophageal cancer Neg Hx    Rectal cancer Neg Hx    Stomach cancer Neg Hx     Past Surgical History:  Procedure Laterality Date   BLADDER SUSPENSION N/A 09/11/2021   Procedure: TRANSVAGINAL TAPE (TVT) PROCEDURE;  Surgeon: Patton Salles, MD;  Location: [facility];  Service: Gynecology;  Laterality: N/A;   CESAREAN SECTION  1997   x 1   CHOLECYSTECTOMY N/A 01/14/2019   Procedure: LAPAROSCOPIC CHOLECYSTECTOMY WITH POSSIBLE  INTRAOPERATIVE CHOLANGIOGRAM;  Surgeon: Darnell Level, MD;  Location: [facility];  Service: General;  Laterality: N/A;   CYSTOSCOPY N/A 09/11/2021   Procedure: CYSTOSCOPY;  Surgeon: Patton Salles, MD;  Location[facility];  Service: Gynecology;  Laterality: N/A;   CYSTOSCOPY/URETEROSCOPY/HOLMIUM LASER Left 01/31/2022   Procedure: LEFT URETEROSCOPY/HOLMIUM LASER;  Surgeon: Despina Arias, MD;  Location: [facility];  Service: Urology;  Laterality: Left;   DILITATION & CURRETTAGE/HYSTROSCOPY WITH HYDROTHERMAL ABLATION N/A 06/22/2016   Procedure: DILATATION & CURETTAGE/HYSTEROSCOPY WITH HYDROTHERMAL ABLATION;  Surgeon: Brock Bad, MD;  Location: [facility];  Service: Gynecology;  Laterality: N/A;   TOTAL LAPAROSCOPIC HYSTERECTOMY WITH SALPINGECTOMY Bilateral 09/11/2021   Procedure: TOTAL LAPAROSCOPIC HYSTERECTOMY WITH SALPINGECTOMY;  Surgeon: Patton Salles, MD;  Location: [facility];  Service: Gynecology;  Laterality: Bilateral;   TUBAL LIGATION     09-28-2015    ROS: Review of Systems Negative except as stated above  PHYSICAL EXAM: BP 104/73   Pulse 89   Temp 98.9 F (37.2 C) (Oral)   Ht 5\' 1"  (1.549 m)   Wt 204 lb (92.5 kg)   LMP  (LMP Unknown)   SpO2 93%   BMI 38.55 kg/m   Physical Exam HENT:     Head: Normocephalic and atraumatic.     Nose: Nose normal.     Mouth/Throat:     Mouth: Mucous membranes are moist.     Pharynx:  Oropharynx is clear.  Eyes:     Extraocular Movements: Extraocular movements intact.     Conjunctiva/sclera: Conjunctivae normal.     Pupils: Pupils are equal, round, and reactive to light.  Cardiovascular:     Rate and Rhythm: Normal rate and regular rhythm.     Pulses: Normal pulses.     Heart sounds: Normal heart sounds.  Pulmonary:     Effort: Pulmonary effort is normal.     Breath sounds: Normal breath sounds.  Musculoskeletal:        General: Normal range of motion.     Cervical back: Normal range of motion and neck supple.  Neurological:     General: No focal deficit present.     Mental Status: She is alert and oriented to person, place, and time.  Psychiatric:        Mood and Affect: Mood normal.        Behavior: Behavior normal.     ASSESSMENT AND PLAN: 1. Primary hypertension - Continue Amlodipine and Hydrochlorothiazide as prescribed.  - Counseled on blood pressure goal of less than 130/80, low-sodium, DASH diet, medication compliance, and 150 minutes of moderate intensity exercise per week as tolerated. Counseled on medication adherence and adverse effects. - Follow-up with primary provider in 3 months or sooner if needed.  - amLODipine (NORVASC) 10 MG tablet; Take 1 tablet (10 mg total) by mouth every evening.  Dispense: 90 tablet; Refill: 0 - hydrochlorothiazide (HYDRODIURIL) 25 MG tablet; Take 1 tablet (25 mg total) by mouth daily.  Dispense: 90 tablet; Refill: 0  2. Prediabetes - Continue Metformin as prescribed. Counseled on medication adherence/adverse effects. - Hemoglobin A1c result pending.  - Discussed the importance of healthy eating habits, low-carbohydrate diet, low-sugar diet, regular aerobic exercise (at least 150 minutes a week as tolerated) and medication compliance to achieve or maintain control of prediabetes. - Follow-up with primary provider as scheduled.  - metFORMIN (GLUCOPHAGE) 500 MG tablet; Take 1 tablet (500 mg total) by mouth 2 (two) times  daily with a meal.  Dispense: 180 tablet; Refill: 0 - Hemoglobin A1c  3. Screening cholesterol level - Routine screening.  - Lipid panel  4. Neuropathy - Continue Gabapentin as prescribed. Counseled on medication adherence/adverse effects.  - Follow-up  with primary provider in 3 months or sooner if needed.  - gabapentin (NEURONTIN) 300 MG capsule; Take 1 capsule (300 mg total) by mouth at bedtime.  Dispense: 90 capsule; Refill: 0  5. Vasomotor symptoms due to menopause - Paroxetine Mesylate as prescribed. Counseled on medication adherence/adverse effects.  - Keep all scheduled appointments with Gynecology. - Follow-up with primary provider as scheduled.  - PARoxetine Mesylate 7.5 MG CAPS; Take 7.5 mg by mouth at bedtime as needed.  Dispense: 30 capsule; Refill: 1   Patient was given the opportunity to ask questions.  Patient verbalized understanding of the plan and was able to repeat key elements of the plan. Patient was given clear instructions to go to Emergency Department or return to medical center if symptoms don't improve, worsen, or new problems develop.The patient verbalized understanding.   Orders Placed This Encounter  Procedures   Lipid panel   Hemoglobin A1c     Requested Prescriptions   Signed Prescriptions Disp Refills   amLODipine (NORVASC) 10 MG tablet 90 tablet 0    Sig: Take 1 tablet (10 mg total) by mouth every evening.   hydrochlorothiazide (HYDRODIURIL) 25 MG tablet 90 tablet 0    Sig: Take 1 tablet (25 mg total) by mouth daily.   metFORMIN (GLUCOPHAGE) 500 MG tablet 180 tablet 0    Sig: Take 1 tablet (500 mg total) by mouth 2 (two) times daily with a meal.   PARoxetine Mesylate 7.5 MG CAPS 30 capsule 1    Sig: Take 7.5 mg by mouth at bedtime as needed.   gabapentin (NEURONTIN) 300 MG capsule 90 capsule 0    Sig: Take 1 capsule (300 mg total) by mouth at bedtime.    Return in about 3 months (around 01/07/2024) for Follow-Up or next available chronic  conditions.  [name], NP

## 2023-10-09 NOTE — Progress Notes (Signed)
Patient states night sweats.   Wants Flu vaccine.   Asking for gabapentin.

## 2023-10-16 ENCOUNTER — Ambulatory Visit: Payer: BC Managed Care – PPO | Admitting: Physical Medicine and Rehabilitation

## 2023-10-16 ENCOUNTER — Telehealth: Payer: Self-pay | Admitting: Physical Medicine and Rehabilitation

## 2023-10-16 NOTE — Telephone Encounter (Signed)
Patient called and needs to reschedule. Her was towed. CB#[phone]

## 2023-10-29 ENCOUNTER — Encounter: Payer: Self-pay | Admitting: Physical Medicine and Rehabilitation

## 2023-10-29 ENCOUNTER — Ambulatory Visit: Payer: BC Managed Care – PPO | Admitting: Physical Medicine and Rehabilitation

## 2023-10-29 VITALS — BP 120/76 | HR 108

## 2023-10-29 DIAGNOSIS — G8929 Other chronic pain: Secondary | ICD-10-CM | POA: Diagnosis not present

## 2023-10-29 DIAGNOSIS — M47819 Spondylosis without myelopathy or radiculopathy, site unspecified: Secondary | ICD-10-CM | POA: Diagnosis not present

## 2023-10-29 DIAGNOSIS — M47816 Spondylosis without myelopathy or radiculopathy, lumbar region: Secondary | ICD-10-CM

## 2023-10-29 DIAGNOSIS — M545 Low back pain, unspecified: Secondary | ICD-10-CM | POA: Diagnosis not present

## 2023-10-29 MED ORDER — DIAZEPAM 5 MG PO TABS
ORAL_TABLET | ORAL | 0 refills | Status: DC
Start: 2023-10-29 — End: 2024-01-08

## 2023-10-29 NOTE — Progress Notes (Signed)
 Brianna Gutierrez - 47 y.o. female MRN [id number]  Date of birth: 1976/08/31  Office Visit Note: Visit Date: 10/29/2023 PCP: Lorren Greig PARAS, NP Referred by: Lorren Greig PARAS, NP  Subjective: Chief Complaint  Patient presents with   Lower Back - Pain   HPI: Brianna Gutierrez is a 47 y.o. female who comes in today for evaluation of chronic, worsening and severe bilateral lower back pain, right greater than left. Intermittent radiation of pain down both legs. Pain ongoing for about 1 years, her pain has gradually increased over the last several months, her pain is severe with standing and bending. She describes pain as sore, aching and burning sensation, currently rates as 8 out of 10. Some relief of pain with home exercise regimen, rest and use of medications. She completed short course of formal physical therapy in the summer of this year with some relief of pain. Recent lumbar MRI imaging shows multi level spondylosis, most prominent at L4-L5. There is substantial degenerative right facet arthropathy at L5-S1. No high grade spinal canal stenosis noted. Patient currently working full time in office at [employer]. Patient denies focal weakness, numbness and tingling. No recent trauma or falls.      Review of Systems  Musculoskeletal:  Positive for back pain and myalgias.  Neurological:  Negative for tingling, sensory change, focal weakness and weakness.  All other systems reviewed and are negative.  Otherwise per HPI.  Assessment & Plan: Visit Diagnoses:    ICD-10-CM   1. Chronic bilateral low back pain without sciatica  M54.50    G89.29     2. Spondylosis without myelopathy or radiculopathy  M47.819     3. Facet arthropathy, lumbar  M47.816        Plan: Findings:  Chronic, worsening and severe bilateral lower back pain, right greater than left. Intermittent radiation of pain down both legs. Patient continues to have severe pain despite good conservative therapies such as  formal physical therapy, home exercise regimen, rest and use of medications. I discussed recent lumbar MRI with patient today using imaging and spine model. Patients clinical presentation and exam are consistent with facet mediated pain. She does have pain with lumbar extension today. Recent lumbar MRI imaging does show facet hypertrophy on the right at L5-S1. I do feel intermittent pain radiating down the legs is more of a facet joint syndrome. We discussed treatment plan in detail today, next step is to perform diagnostic and hopefully therapeutic bilateral facet joint injections under fluoroscopic guidance. I discussed injection procedure in detail today, she has no questions at this time. If good relief of pain with facet joint injections we discussed possibility of longer sustained pain relief with radiofrequency ablation procedure. Should her symptoms present as more radicular in nature, we would consider performing lumbar epidural steroid injection. I encouraged patient to remain active as tolerated. No red flag symptoms noted upon exam today.     Meds & Orders: No orders of the defined types were placed in this encounter.  No orders of the defined types were placed in this encounter.   Follow-up: Return for Bilateral L5-S1 facet joint injections.   Procedures: No procedures performed      Clinical History: CLINICAL DATA:  Low back pain   EXAM: MRI LUMBAR SPINE WITHOUT CONTRAST   TECHNIQUE: Multiplanar, multisequence MR imaging of the lumbar spine was performed. No intravenous contrast was administered.   COMPARISON:  CT scan 02/03/2023   FINDINGS: Segmentation: The lowest lumbar type  non-rib-bearing vertebra is labeled as L5.   Alignment:  No vertebral subluxation is observed.   Vertebrae: Mild facet edema on the right at L5-S1 and bilaterally at L4-5. Mild prominence of epidural adipose tissue in the lower lumbar spine with mild congenitally shortened pedicles.   Conus  medullaris and cauda equina: Conus extends to the lower L2 level, mildly abnormally low. No tethering mass observed. Conus and cauda equina appear otherwise normal.   Paraspinal and other soft tissues: Fluid signal intensity lesion of the right kidney is most compatible with a simple cyst. No further imaging workup of this lesion is indicated. Small retroperitoneal cyst posterior to the right adrenal gland, previously worked up on prior abdominal MRI, benign. No further imaging workup of this lesion is indicated.   Disc levels:   L1-2: Unremarkable   L2-3: Unremarkable   L3-4: Unremarkable   L4-5: Mild left foraminal stenosis along with mild right eccentric central narrowing of the thecal sac and mild displacement of the left L4 nerve in the lateral extraforaminal space as well as borderline right subarticular lateral recess stenosis due to right eccentric disc bulge and degenerative facet arthropathy along with a small left foraminal disc protrusion.   L5-S1: No impingement. Substantial degenerative right facet arthropathy.   IMPRESSION: 1. Lumbar spondylosis and degenerative disc disease, causing mild impingement at L4-5. 2. Substantial degenerative right facet arthropathy at L5-S1. 3. Mild facet edema on the right at L5-S1 and bilaterally at L4-5. 4. Conus extends to the lower L2 level, mildly abnormally low. No tethering mass observed.     Electronically Signed   By: Ryan Salvage M.D.   On: 10/14/2023 15:42   She reports that she has been smoking cigarettes. She has a 10 pack-year smoking history. She has never been exposed to tobacco smoke. She has never used smokeless tobacco.  Recent Labs    07/08/23 1619  HGBA1C 6.4*    Objective:  VS:  HT:    WT:   BMI:     BP:120/76  HR:(!) 108bpm  TEMP: ( )  RESP:  Physical Exam Vitals and nursing note reviewed.  HENT:     Head: Normocephalic and atraumatic.     Right Ear: External ear normal.     Left  Ear: External ear normal.     Nose: Nose normal.     Mouth/Throat:     Mouth: Mucous membranes are moist.  Eyes:     Extraocular Movements: Extraocular movements intact.  Cardiovascular:     Rate and Rhythm: Normal rate.     Pulses: Normal pulses.  Pulmonary:     Effort: Pulmonary effort is normal.  Abdominal:     General: Abdomen is flat. There is no distension.  Musculoskeletal:        General: Tenderness present.     Cervical back: Normal range of motion.     Comments: Patient rises from seated position to standing without difficulty. Pain noted with lumbar extension and facet loading. 5/5 strength noted with bilateral hip flexion, knee flexion/extension, ankle dorsiflexion/plantarflexion and EHL. No clonus noted bilaterally. No pain upon palpation of greater trochanters. No pain with internal/external rotation of bilateral hips. Sensation intact bilaterally. Negative slump test bilaterally. Ambulates without aid, gait steady.     Skin:    General: Skin is warm and dry.     Capillary Refill: Capillary refill takes less than 2 seconds.  Neurological:     General: No focal deficit present.     Mental Status:  She is alert and oriented to person, place, and time.  Psychiatric:        Mood and Affect: Mood normal.        Behavior: Behavior normal.     Ortho Exam  Imaging: No results found.  Past Medical/Family/Surgical/Social History: Medications & Allergies reviewed per EMR, new medications updated. 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    Smoking 10/26/2021   Postoperative intra-abdominal abscess [facility])    Medication monitoring encounter    Pelvic infection in female 09/24/2021   Status post laparoscopic hysterectomy 09/11/2021   Cholelithiasis 01/13/2019   Pre-diabetes 11/15/2016   History of bilateral tubal ligation 09/28/2015   Tobacco abuse 09/28/2015   Past Medical History:  Diagnosis Date   Abnormal Pap smear of cervix    Anxiety    Depression    Dysmenorrhea    Eczema    History of COVID-19 10/24/2020   loss of taste and smellbody aches and cough x 4 days all symptoms resolved   History of kidney stones    Hypertension    Pre-diabetes    PTSD (post-traumatic stress disorder)    Rash 08/31/2021   comes and goes on legs saw dermetology no rash now ? food allergy   Sleep apnea    SUI (stress urinary incontinence, female)    Trichomonas infection 02/2021   Uterine fibroid    Family History  Problem Relation Age of Onset   Colon polyps Sister    Hypertension Maternal Grandmother    Diabetes Maternal Grandfather    Allergic rhinitis Daughter    Allergic rhinitis Daughter    Allergic rhinitis Son    Allergic rhinitis Son    Colon cancer Neg Hx    Esophageal cancer Neg Hx    Rectal cancer Neg Hx    Stomach cancer Neg Hx    Past Surgical History:  Procedure Laterality Date   BLADDER SUSPENSION N/A 09/11/2021   Procedure: TRANSVAGINAL TAPE (TVT) PROCEDURE;  Surgeon: Cathlyn JAYSON Nikki Bobie FORBES, MD;  Location: [facility];  Service: Gynecology;  Laterality: N/A;   CESAREAN SECTION  1997   x 1   CHOLECYSTECTOMY N/A 01/14/2019   Procedure: LAPAROSCOPIC CHOLECYSTECTOMY WITH POSSIBLE  INTRAOPERATIVE CHOLANGIOGRAM;  Surgeon: Eletha Boas, MD;  Location: [facility];  Service: General;  Laterality: N/A;   CYSTOSCOPY N/A 09/11/2021   Procedure: PHYLLIS;  Surgeon: Cathlyn JAYSON Nikki Bobie FORBES, MD;  Location: [facility];  Service: Gynecology;  Laterality: N/A;    CYSTOSCOPY/URETEROSCOPY/HOLMIUM LASER Left 01/31/2022   Procedure: LEFT URETEROSCOPY/HOLMIUM LASER;  Surgeon: Lovie Arlyss CROME, MD;  Location: [facility];  Service: Urology;  Laterality: Left;   DILITATION & CURRETTAGE/HYSTROSCOPY WITH HYDROTHERMAL ABLATION N/A 06/22/2016   Procedure: DILATATION & CURETTAGE/HYSTEROSCOPY WITH HYDROTHERMAL ABLATION;  Surgeon: Carlin DELENA Centers, MD;  Location: [facility];  Service: Gynecology;  Laterality: N/A;   TOTAL LAPAROSCOPIC HYSTERECTOMY WITH SALPINGECTOMY Bilateral 09/11/2021   Procedure: TOTAL LAPAROSCOPIC HYSTERECTOMY WITH SALPINGECTOMY;  Surgeon: Cathlyn JAYSON Nikki Bobie FORBES, MD;  Location: [facility];  Service: Gynecology;  Laterality: Bilateral;   TUBAL  LIGATION     09-28-2015   Social History   Occupational History   Not on file  Tobacco Use   Smoking status: Every Day    Current packs/day: 0.50    Average packs/day: 0.5 packs/day for 20.0 years (10.0 ttl pk-yrs)    Types: Cigarettes    Passive exposure: Never   Smokeless tobacco: Never  Vaping Use   Vaping status: Former  Substance and Sexual Activity   Alcohol  use: Not Currently   Drug use: Never   Sexual activity: Not Currently    Partners: Male    Birth control/protection: Surgical    Comment: TLH 09/11/21

## 2023-10-29 NOTE — Progress Notes (Signed)
 Functional Pain Scale - descriptive words and definitions  Distressing (6)    Pain is present/unable to complete most ADLs limited by pain/sleep is difficult and active distraction is only marginal. Moderate range order  Average Pain 6   MRI review, LBP

## 2023-10-31 ENCOUNTER — Other Ambulatory Visit: Payer: Self-pay | Admitting: Physical Medicine and Rehabilitation

## 2023-11-06 ENCOUNTER — Ambulatory Visit (INDEPENDENT_AMBULATORY_CARE_PROVIDER_SITE_OTHER): Payer: BC Managed Care – PPO | Admitting: Allergy

## 2023-11-06 ENCOUNTER — Other Ambulatory Visit: Payer: Self-pay

## 2023-11-06 VITALS — BP 130/84 | HR 109 | Temp 98.7°F | Resp 12 | Ht 61.0 in | Wt 203.8 lb

## 2023-11-06 DIAGNOSIS — J3081 Allergic rhinitis due to animal (cat) (dog) hair and dander: Secondary | ICD-10-CM

## 2023-11-06 DIAGNOSIS — J3089 Other allergic rhinitis: Secondary | ICD-10-CM

## 2023-11-06 DIAGNOSIS — T781XXD Other adverse food reactions, not elsewhere classified, subsequent encounter: Secondary | ICD-10-CM | POA: Diagnosis not present

## 2023-11-06 DIAGNOSIS — J988 Other specified respiratory disorders: Secondary | ICD-10-CM

## 2023-11-06 DIAGNOSIS — R21 Rash and other nonspecific skin eruption: Secondary | ICD-10-CM

## 2023-11-06 DIAGNOSIS — R0609 Other forms of dyspnea: Secondary | ICD-10-CM

## 2023-11-06 DIAGNOSIS — K219 Gastro-esophageal reflux disease without esophagitis: Secondary | ICD-10-CM

## 2023-11-06 DIAGNOSIS — L501 Idiopathic urticaria: Secondary | ICD-10-CM | POA: Diagnosis not present

## 2023-11-06 MED ORDER — DESONIDE 0.05 % EX OINT: 1.0000 | TOPICAL_OINTMENT | Freq: Two times a day (BID) | CUTANEOUS | 2 refills | Status: DC | PRN

## 2023-11-06 NOTE — Progress Notes (Signed)
 Follow Up Note  RE: Brianna Gutierrez MRN: [id number] DOB: 1976-07-02 Date of Office Visit: 11/06/2023  Referring provider: Lorren Greig PARAS, NP Primary care provider: Lorren Greig PARAS, NP  Chief Complaint: Rash (Rash on neck started yesterday.) and Pruritus  History of Present Illness: I had the pleasure of seeing Brianna Gutierrez for a follow up visit at the [facility] on 11/07/2023. She is a 48 y.o. 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 Her previous allergy office visit was on 06/24/2023 with Dr. Luke. Today is a regular follow up visit.  Discussed the use of AI scribe software for clinical note transcription with the patient, who gave verbal consent to proceed.  The patient, with a history of chronic hives, presents with a new rash on the neck that appeared the day prior. The rash, described as a burning sensation, is unlike her typical hives. She denies any new exposures or triggers, although she did consume beef meatballs with barbecue sauce the day the rash appeared. She denies any issues with beef consumption in the past and does not consume pork or lamb.  The patient's chronic hives have been managed with Xolair  injections every five weeks. She reported one instance of itching prior to her scheduled injection, which resolved post-injection. She denies taking any additional medications such as Allegra  or famotidine  for her hives, as she has been doing well overall.  In addition to the new rash, the patient reports symptoms of a cold that started the day prior, including congestion and a dry cough. The cough is described as burning in the chest. She denies fevers or chills. She has been using her inhaler three times a day for the past two days without significant relief. She has also been taking over-the-counter Robitussin for the cough. Prior to the onset of the cold, she denies any need for her  inhaler.  She continues to take famotidine  once daily for heartburn, which has been well-controlled.      Assessment and Plan: Brianna Gutierrez is a 48 y.o. female with: Neck rash New onset, burning sensation, no known triggers. Unclear etiology. Use desonide  0.05% ointment twice a day as needed for mild rash flares - okay to use on the face, neck, groin area. Do not use more than 1 week at a time. Take pictures of the rash.  Chronic idiopathic urticaria Past history - Pruritic rash for 1 year lasting 1 day at a time. No triggers noted. Occurs a few times per month. Concerned whether strawberries, bananas and seafood may be contributing to the symptoms. Saw dermatology virtually in the past with no benefit.  Interim history - controlled on Xolair  every 5 weeks. Continue proper skin care. Keep track of flares. During hive flare start the following medications until symptoms subside. Allegra  180mg  1-2 times per day. Famotidine  20mg  twice a day Avoid the following potential triggers: alcohol , tight clothing, NSAIDs, hot showers and getting overheated. Xolair  300mg  every 5 weeks.    Dyspnea on exertion Past history - Dyspnea on exertion and uses albuterol  as needed with good benefit. Has been using it daily since prescribed 1 month ago. 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 CT chest in April 2023 was unremarkable. 2023 spirometry showed restriction with 2% improvement FEV1 postbronchodilator treatment. Clinically feeling improved  May use albuterol  rescue inhaler 2 puffs every 4 to 6 hours as needed for shortness of breath,  chest tightness, coughing, and wheezing. May use albuterol  rescue inhaler 2 puffs 5 to 15 minutes prior to strenuous physical activities. Monitor frequency of use - if you need to use it more than twice per week on a consistent basis let us  know.    Other adverse food reactions, not elsewhere classified, subsequent encounter Past history - 2024  bloodwork positive to pork and lamb. Tolerates bananas and strawberries.  Continue to avoid pork, lamb and beef jerky. Monitor symptoms after eating beef.    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  Continue environmental control measures.    GERD Continue famotidine  20mg  once a day  Respiratory infection New onset cough and congestion, no fever or chills.  See below for symptoms management. If you develop fevers or feel worse recommending seeing PCP or to go urgent care.   Return in about 6 months (around 05/05/2024).  Meds ordered this encounter  Medications   desonide  (DESOWEN ) 0.05 % ointment    Sig: Apply 1 Application topically 2 (two) times daily as needed (mild rash flare). Okay to use on the face, neck, groin area. Do not use more than 1 week at a time.    Dispense:  60 g    Refill:  2   Lab Orders  No laboratory test(s) ordered today    Diagnostics: None.   Medication List:  Current Outpatient Medications  Medication Sig Dispense Refill   amLODipine  (NORVASC ) 10 MG tablet Take 1 tablet (10 mg total) by mouth every evening. 90 tablet 0   desonide  (DESOWEN ) 0.05 % ointment Apply 1 Application topically 2 (two) times daily as needed (mild rash flare). Okay to use on the face, neck, groin area. Do not use more than 1 week at a time. 60 g 2   diazepam  (VALIUM ) 5 MG tablet Take one tablet by mouth with food one hour prior to procedure. May repeat 30 minutes prior if needed. 2 tablet 0   EPINEPHrine  (EPIPEN  2-PAK) 0.3 mg/0.3 mL IJ SOAJ injection Inject 0.3 mg into the muscle as needed for anaphylaxis. 2 each 2   gabapentin  (NEURONTIN ) 300 MG capsule Take 1 capsule (300 mg total) by mouth at bedtime. 90 capsule 0   hydrochlorothiazide  (HYDRODIURIL ) 25 MG tablet Take 1 tablet (25 mg total) by mouth daily. 90 tablet 0   lamoTRIgine  (LAMICTAL ) 100 MG tablet Take 1 tablet (100 mg total)  by mouth daily. 90 tablet 0   metFORMIN  (GLUCOPHAGE ) 500 MG tablet Take 1 tablet (500 mg total) by mouth 2 (two) times daily with a meal. 180 tablet 0   PARoxetine  Mesylate 7.5 MG CAPS Take 7.5 mg by mouth at bedtime as needed. 30 capsule 1   traZODone  (DESYREL ) 100 MG tablet Take 1 tablet (100 mg total) by mouth at bedtime. Take an additional 50 mg as needed if not asleep in 30 minutes 135 tablet 0   XOLAIR  150 MG/ML prefilled syringe INJECT 300MG  SUBCUTANEOUSLY  EVERY 4 WEEKS 2 mL 11   chlorpheniramine-HYDROcodone  (TUSSIONEX) 10-8 MG/5ML Take 5 mLs by mouth every 12 (twelve) hours as needed for cough. 115 mL 0   doxycycline  (VIBRAMYCIN ) 100 MG capsule Take 1 capsule (100 mg total) by mouth 2 (two) times daily. 14 capsule 0   predniSONE  (DELTASONE ) 50 MG tablet Take 1 tablet (50 mg total) by mouth daily with breakfast. 5 tablet 0   Current Facility-Administered Medications  Medication Dose  Route Frequency Provider Last Rate Last Admin   omalizumab  (XOLAIR ) prefilled syringe 300 mg  300 mg Subcutaneous Q28 days Cheryl Reusing, FNP   300 mg at 10/03/23 1456   Allergies: Allergies  Allergen Reactions   Pork-Derived Products    Tramadol      rash   I reviewed her past medical history, social history, family history, and environmental history and no significant changes have been reported from her previous visit.  Review of Systems  Constitutional:  Negative for appetite change, chills, fever and unexpected weight change.  HENT:  Positive for congestion. Negative for rhinorrhea.   Eyes:  Negative for itching.  Respiratory:  Positive for cough. Negative for chest tightness, shortness of breath and wheezing.   Cardiovascular:  Negative for chest pain.  Gastrointestinal:  Negative for abdominal pain.  Genitourinary:  Negative for difficulty urinating.  Skin:  Positive for rash.  Allergic/Immunologic: Positive for environmental allergies.  Neurological:  Negative for headaches.     Objective: BP 130/84   Pulse (!) 109   Temp 98.7 F (37.1 C)   Resp 12   Ht 5' 1 (1.549 m)   Wt 203 lb 12.8 oz (92.4 kg)   LMP  (LMP Unknown)   SpO2 97%   BMI 38.51 kg/m  Body mass index is 38.51 kg/m. Physical Exam Vitals and nursing note reviewed.  Constitutional:      Appearance: Normal appearance. She is well-developed. She is obese.  HENT:     Head: Normocephalic and atraumatic.     Right Ear: Tympanic membrane and external ear normal.     Left Ear: Tympanic membrane and external ear normal.     Nose: Nose normal.     Mouth/Throat:     Mouth: Mucous membranes are moist.     Comments: Cobblestoning noted. Eyes:     Conjunctiva/sclera: Conjunctivae normal.  Cardiovascular:     Rate and Rhythm: Normal rate and regular rhythm.     Heart sounds: Normal heart sounds. No murmur heard.    No friction rub. No gallop.  Pulmonary:     Effort: Pulmonary effort is normal.     Breath sounds: Normal breath sounds. No wheezing, rhonchi or rales.  Musculoskeletal:     Cervical back: Neck supple.  Skin:    General: Skin is warm.     Comments: Peeling skin on the neck.  Neurological:     Mental Status: She is alert and oriented to person, place, and time.  Psychiatric:        Behavior: Behavior normal.    Previous notes and tests were reviewed. The plan was reviewed with the patient/family, and all questions/concerned were addressed.  It was my pleasure to see Brianna Gutierrez today and participate in her care. Please feel free to contact me with any questions or concerns.  Sincerely,  Orlan Cramp, DO Allergy & Immunology  [facility]  [facility] office: [phone] [facility] office: [phone]

## 2023-11-06 NOTE — Patient Instructions (Addendum)
 Rash on neck Not sure what this is. Use desonide  0.05% ointment twice a day as needed for mild rash flares - okay to use on the face, neck, groin area. Do not use more than 1 week at a time. Take pictures of the rash.  Skin: Continue proper skin care. Keep track of flares. Continue to avoid pork, lamb and beef jerky. Monitor symptoms after eating beef.  During hive flare start the following medications until symptoms subside. Allegra  180mg  1-2 times per day. Famotidine  20mg  twice a day Avoid the following potential triggers: alcohol , tight clothing, NSAIDs, hot showers and getting overheated. Xolair  300mg  every 5 weeks.  If you have symptoms/breakouts then let us  know.   Environmental allergies 2023 skin testing positive to mold and dog.  Continue environmental control measures.  Use over the counter antihistamines such as Zyrtec  (cetirizine ), Claritin  (loratadine ), Allegra  (fexofenadine ), or Xyzal  (levocetirizine) daily as needed.   Breathing May use albuterol  rescue inhaler 2 puffs every 4 to 6 hours as needed for shortness of breath, chest tightness, coughing, and wheezing. May use albuterol  rescue inhaler 2 puffs 5 to 15 minutes prior to strenuous physical activities. Monitor frequency of use - if you need to use it more than twice per week on a consistent basis let us  know.   GERD Continue famotidine  20mg  once a day.    Respiratory infection See below for symptoms management. If you develop fevers or feel worse recommending seeing PCP or to go urgent care.   Follow up in 6 months or sooner if needed.   Drink plenty of fluids. Water, juice, clear broth or warm lemon water are good choices. Avoid caffeine  and alcohol , which can dehydrate you. Eat chicken soup. Chicken soup and other warm fluids can be soothing and loosen congestion. Rest. Adjust your room's temperature and humidity. Keep your room warm but not overheated. If the air is dry, a cool-mist humidifier or  vaporizer can moisten the air and help ease congestion and coughing. Keep the humidifier clean to prevent the growth of bacteria and molds. Soothe your throat. Perform a saltwater gargle. Dissolve one-quarter to a half teaspoon of salt in a 4- to 8-ounce glass of warm water. This can relieve a sore or scratchy throat temporarily. Use saline nasal drops. To help relieve nasal congestion, try saline nasal drops. You can buy these drops over the counter, and they can help relieve symptoms ? even in children. Take over-the-counter cold and cough medications. For adults and children older than 5, over-the-counter decongestants, antihistamines and pain relievers might offer some symptom relief. However, they won't prevent a cold or shorten its duration.  Mold Control Mold and fungi can grow on a variety of surfaces provided certain temperature and moisture conditions exist.  Outdoor molds grow on plants, decaying vegetation and soil. The major outdoor mold, Alternaria and Cladosporium, are found in very high numbers during hot and dry conditions. Generally, a late summer - fall peak is seen for common outdoor fungal spores. Rain will temporarily lower outdoor mold spore count, but counts rise rapidly when the rainy period ends. The most important indoor molds are Aspergillus and Penicillium. Dark, humid and poorly ventilated basements are ideal sites for mold growth. The next most common sites of mold growth are the bathroom and the kitchen. Outdoor (Seasonal) Mold Control Use air conditioning and keep windows closed. Avoid exposure to decaying vegetation. Avoid leaf raking. Avoid grain handling. Consider wearing a face mask if working in moldy areas.  Indoor (Perennial)  Mold Control  Maintain humidity below 50%. Get rid of mold growth on hard surfaces with water, detergent and, if necessary, 5% bleach (do not mix with other cleaners). Then dry the area completely. If mold covers an area more than 10  square feet, consider hiring an indoor environmental professional. For clothing, washing with soap and water is best. If moldy items cannot be cleaned and dried, throw them away. Remove sources e.g. contaminated carpets. Repair and seal leaking roofs or pipes. Using dehumidifiers in damp basements may be helpful, but empty the water and clean units regularly to prevent mildew from forming. All rooms, especially basements, bathrooms and kitchens, require ventilation and cleaning to deter mold and mildew growth. Avoid carpeting on concrete or damp floors, and storing items in damp areas. Pet Allergen Avoidance: Contrary to popular opinion, there are no "hypoallergenic" breeds of dogs or cats. That is because people are not allergic to an animal's hair, but to an allergen found in the animal's saliva, dander (dead skin flakes) or urine. Pet allergy symptoms typically occur within minutes. For some people, symptoms can build up and become most severe 8 to 12 hours after contact with the animal. People with severe allergies can experience reactions in public places if dander has been transported on the pet owners' clothing. Keeping an animal outdoors is only a partial solution, since homes with pets in the yard still have higher concentrations of animal allergens. Before getting a pet, ask your allergist to determine if you are allergic to animals. If your pet is already considered part of your family, try to minimize contact and keep the pet out of the bedroom and other rooms where you spend a great deal of time. As with dust mites, vacuum carpets often or replace carpet with a hardwood floor, tile or linoleum. High-efficiency particulate air (HEPA) cleaners can reduce allergen levels over time. While dander and saliva are the source of cat and dog allergens, urine is the source of allergens from rabbits, hamsters, mice and guinea pigs; so ask a non-allergic family member to clean the animal's cage. If you  have a pet allergy, talk to your allergist about the potential for allergy immunotherapy (allergy shots). This strategy can often provide long-term relief.

## 2023-11-07 ENCOUNTER — Emergency Department (HOSPITAL_COMMUNITY): Payer: BC Managed Care – PPO

## 2023-11-07 ENCOUNTER — Emergency Department (HOSPITAL_COMMUNITY)
Admission: EM | Admit: 2023-11-07 | Discharge: 2023-11-07 | Disposition: A | Discharge status: Home / Self Care | Payer: BC Managed Care – PPO | Attending: Emergency Medicine | Admitting: Emergency Medicine

## 2023-11-07 ENCOUNTER — Encounter: Payer: Self-pay | Admitting: Allergy

## 2023-11-07 ENCOUNTER — Encounter (HOSPITAL_COMMUNITY): Payer: Self-pay

## 2023-11-07 ENCOUNTER — Other Ambulatory Visit: Payer: Self-pay

## 2023-11-07 DIAGNOSIS — Z79899 Other long term (current) drug therapy: Secondary | ICD-10-CM | POA: Diagnosis not present

## 2023-11-07 DIAGNOSIS — J189 Pneumonia, unspecified organism: Secondary | ICD-10-CM | POA: Diagnosis not present

## 2023-11-07 DIAGNOSIS — R531 Weakness: Secondary | ICD-10-CM | POA: Diagnosis not present

## 2023-11-07 DIAGNOSIS — R509 Fever, unspecified: Secondary | ICD-10-CM | POA: Diagnosis not present

## 2023-11-07 DIAGNOSIS — F17200 Nicotine dependence, unspecified, uncomplicated: Secondary | ICD-10-CM | POA: Insufficient documentation

## 2023-11-07 DIAGNOSIS — Z20822 Contact with and (suspected) exposure to covid-19: Secondary | ICD-10-CM | POA: Insufficient documentation

## 2023-11-07 DIAGNOSIS — J101 Influenza due to other identified influenza virus with other respiratory manifestations: Secondary | ICD-10-CM | POA: Insufficient documentation

## 2023-11-07 DIAGNOSIS — R918 Other nonspecific abnormal finding of lung field: Secondary | ICD-10-CM | POA: Diagnosis not present

## 2023-11-07 DIAGNOSIS — I10 Essential (primary) hypertension: Secondary | ICD-10-CM | POA: Diagnosis not present

## 2023-11-07 DIAGNOSIS — Z720 Tobacco use: Secondary | ICD-10-CM

## 2023-11-07 DIAGNOSIS — R059 Cough, unspecified: Secondary | ICD-10-CM | POA: Diagnosis not present

## 2023-11-07 LAB — RESP PANEL BY RT-PCR (RSV, FLU A&B, COVID)  RVPGX2
Influenza A by PCR: POSITIVE — AB
Influenza B by PCR: NEGATIVE
Resp Syncytial Virus by PCR: NEGATIVE
SARS Coronavirus 2 by RT PCR: NEGATIVE

## 2023-11-07 MED ORDER — ALBUTEROL SULFATE HFA 108 (90 BASE) MCG/ACT IN AERS
1.0000 | INHALATION_SPRAY | RESPIRATORY_TRACT | Status: DC | PRN
  Filled 2023-11-07: qty 6.7

## 2023-11-07 MED ORDER — AEROCHAMBER Z-STAT PLUS/MEDIUM MISC
1.0000 | Freq: Once | Status: AC
  Administered 2023-11-07: 1
  Filled 2023-11-07: qty 1

## 2023-11-07 MED ORDER — IPRATROPIUM-ALBUTEROL 0.5-2.5 (3) MG/3ML IN SOLN
3.0000 mL | Freq: Once | RESPIRATORY_TRACT | Status: AC
  Administered 2023-11-07: 3 mL via RESPIRATORY_TRACT
  Filled 2023-11-07: qty 3

## 2023-11-07 MED ORDER — DOXYCYCLINE HYCLATE 100 MG PO CAPS: 100.0000 mg | ORAL_CAPSULE | Freq: Two times a day (BID) | ORAL | 0 refills | Status: DC

## 2023-11-07 MED ORDER — DOXYCYCLINE HYCLATE 100 MG PO TABS
100.0000 mg | ORAL_TABLET | Freq: Once | ORAL | Status: AC
  Administered 2023-11-07: 100 mg via ORAL
  Filled 2023-11-07: qty 1

## 2023-11-07 MED ORDER — PREDNISONE 50 MG PO TABS: 50.0000 mg | ORAL_TABLET | Freq: Every day | ORAL | 0 refills | Status: DC

## 2023-11-07 MED ORDER — HYDROCOD POLI-CHLORPHE POLI ER 10-8 MG/5ML PO SUER: 5.0000 mL | Freq: Two times a day (BID) | ORAL | 0 refills | Status: DC | PRN

## 2023-11-07 MED ORDER — DEXAMETHASONE SODIUM PHOSPHATE 10 MG/ML IJ SOLN
10.0000 mg | Freq: Once | INTRAMUSCULAR | Status: AC
  Administered 2023-11-07: 10 mg via INTRAMUSCULAR
  Filled 2023-11-07: qty 1

## 2023-11-07 MED ORDER — KETOROLAC TROMETHAMINE 30 MG/ML IJ SOLN
30.0000 mg | Freq: Once | INTRAMUSCULAR | Status: AC
  Administered 2023-11-07: 30 mg via INTRAMUSCULAR
  Filled 2023-11-07: qty 1

## 2023-11-07 NOTE — ED Provider Notes (Signed)
  EMERGENCY DEPARTMENT AT [facility] Provider Note   CSN: [id number] Arrival date & time: 11/07/23  9171     History  Chief Complaint  Patient presents with   Generalized Body Aches    Brianna Gutierrez is a 48 y.o. female.  Pt is a 48 yo female with pmhx significant for htn, depression, fibroids, ptsd and pre-dm.  Pt has had aches and fever and cough.  People at her work have been sick.  Pt feels weak.  She does smoke, but has not been able to smoke in 2 days b/c she's been sob.       Home Medications Prior to Admission medications   Medication Sig Start Date End Date Taking? Authorizing Provider  chlorpheniramine-HYDROcodone  (TUSSIONEX) 10-8 MG/5ML Take 5 mLs by mouth every 12 (twelve) hours as needed for cough. 11/07/23  Yes Dean Clarity, MD  doxycycline  (VIBRAMYCIN ) 100 MG capsule Take 1 capsule (100 mg total) by mouth 2 (two) times daily. 11/07/23  Yes Dean Clarity, MD  predniSONE  (DELTASONE ) 50 MG tablet Take 1 tablet (50 mg total) by mouth daily with breakfast. 11/07/23  Yes Dean Clarity, MD  amLODipine  (NORVASC ) 10 MG tablet Take 1 tablet (10 mg total) by mouth every evening. 10/09/23 01/07/24  Lorren Greig PARAS, NP  desonide  (DESOWEN ) 0.05 % ointment Apply 1 Application topically 2 (two) times daily as needed (mild rash flare). Okay to use on the face, neck, groin area. Do not use more than 1 week at a time. 11/06/23   Luke Orlan HERO, DO  diazepam  (VALIUM ) 5 MG tablet Take one tablet by mouth with food one hour prior to procedure. May repeat 30 minutes prior if needed. 10/29/23   Williams, Megan E, NP  EPINEPHrine  (EPIPEN  2-PAK) 0.3 mg/0.3 mL IJ SOAJ injection Inject 0.3 mg into the muscle as needed for anaphylaxis. 08/29/23   Jeneal Danita Macintosh, MD  gabapentin  (NEURONTIN ) 300 MG capsule Take 1 capsule (300 mg total) by mouth at bedtime. 10/09/23   Lorren Greig PARAS, NP  hydrochlorothiazide  (HYDRODIURIL ) 25 MG tablet Take 1 tablet (25 mg total) by  mouth daily. 10/09/23 01/07/24  Lorren Greig PARAS, NP  lamoTRIgine  (LAMICTAL ) 100 MG tablet Take 1 tablet (100 mg total) by mouth daily. 08/29/23 11/27/23  Carvin Arvella HERO, MD  lidocaine  (LIDODERM ) 5 % Place 1 patch onto the skin daily. Remove & Discard patch within 12 hours or as directed by MD Patient not taking: Reported on 11/06/2023 08/06/23   Kingsley, Victoria K, DO  metFORMIN  (GLUCOPHAGE ) 500 MG tablet Take 1 tablet (500 mg total) by mouth 2 (two) times daily with a meal. 10/09/23 01/07/24  Lorren Greig PARAS, NP  methocarbamol  (ROBAXIN ) 500 MG tablet Take 1 tablet (500 mg total) by mouth 2 (two) times daily. Patient not taking: Reported on 11/06/2023 02/03/23   Prosperi, Christian H, PA-C  naproxen  (NAPROSYN ) 500 MG tablet Take 1 tablet (500 mg total) by mouth 2 (two) times daily. Patient not taking: Reported on 11/06/2023 08/06/23   Kingsley, Victoria K, DO  PARoxetine  Mesylate 7.5 MG CAPS Take 7.5 mg by mouth at bedtime as needed. 10/09/23   Lorren Greig PARAS, NP  traZODone  (DESYREL ) 100 MG tablet Take 1 tablet (100 mg total) by mouth at bedtime. Take an additional 50 mg as needed if not asleep in 30 minutes 08/29/23   Carvin Arvella HERO, MD  triamcinolone  ointment (KENALOG ) 0.1 % Apply 1 Application topically 2 (two) times daily as needed (rash flare). Do not use  on the face, neck, armpits or groin area. Do not use more than 3 weeks in a row. Patient not taking: Reported on 11/06/2023 06/28/23   Luke Orlan HERO, DO  XOLAIR  150 MG/ML prefilled syringe INJECT 300MG  SUBCUTANEOUSLY  EVERY 4 WEEKS 08/27/23   Luke Orlan HERO, DO  dicyclomine  (BENTYL ) 20 MG tablet Take 1 tablet (20 mg total) by mouth 2 (two) times daily. 10/29/19 08/24/20  Babara Greig GAILS, PA-C  omeprazole  (PRILOSEC) 20 MG capsule Take 1 capsule (20 mg total) by mouth daily. 01/01/19 08/07/19  Arloa Suzen RAMAN, NP      Allergies    Pork-derived products and Tramadol     Review of Systems   Review of Systems  Constitutional:  Positive for fever.  Respiratory:   Positive for cough and shortness of breath.   Musculoskeletal:  Positive for myalgias.  All other systems reviewed and are negative.   Physical Exam Updated Vital Signs BP (!) 147/103   Pulse 96   Temp 99.9 F (37.7 C)   Resp 16   Ht 5' 1 (1.549 m)   Wt 93.9 kg   LMP  (LMP Unknown)   SpO2 95%   BMI 39.11 kg/m  Physical Exam Vitals and nursing note reviewed.  Constitutional:      Appearance: Normal appearance. She is obese.  HENT:     Head: Normocephalic and atraumatic.     Right Ear: External ear normal.     Left Ear: External ear normal.     Nose: Nose normal.     Mouth/Throat:     Mouth: Mucous membranes are moist.     Pharynx: Oropharynx is clear.  Eyes:     Extraocular Movements: Extraocular movements intact.     Conjunctiva/sclera: Conjunctivae normal.     Pupils: Pupils are equal, round, and reactive to light.  Cardiovascular:     Rate and Rhythm: Normal rate and regular rhythm.     Pulses: Normal pulses.     Heart sounds: Normal heart sounds.  Pulmonary:     Breath sounds: Wheezing present.  Abdominal:     General: Abdomen is flat. Bowel sounds are normal.     Palpations: Abdomen is soft.  Musculoskeletal:        General: Normal range of motion.     Cervical back: Normal range of motion and neck supple.  Skin:    General: Skin is warm.     Capillary Refill: Capillary refill takes less than 2 seconds.  Neurological:     General: No focal deficit present.     Mental Status: She is alert and oriented to person, place, and time.  Psychiatric:        Mood and Affect: Mood normal.        Behavior: Behavior normal.     ED Results / Procedures / Treatments   Labs (all labs ordered are listed, but only abnormal results are displayed) Labs Reviewed  RESP PANEL BY RT-PCR (RSV, FLU A&B, COVID)  [id number] - Abnormal; Notable for the following components:      Result Value   Influenza A by PCR POSITIVE (*)    All other components within normal limits     EKG None  Radiology DG Chest 2 View Result Date: 11/07/2023 CLINICAL DATA:  Generalized body aches, fever, cough and weakness. EXAM: CHEST - 2 VIEW COMPARISON:  11/21/2022 FINDINGS: The heart size and mediastinal contours are within normal limits. New parenchymal opacities in both lower lung zones which may represent  atelectasis but also are suspicious for developing pneumonia, especially on the left. No edema, pneumothorax or pleural fluid. The visualized skeletal structures are unremarkable. IMPRESSION: New parenchymal opacities in both lower lung zones which may represent atelectasis but also are suspicious for developing pneumonia, especially on the left. Electronically Signed   By: Marcey Moan M.D.   On: 11/07/2023 09:21    Procedures Procedures    Medications Ordered in ED Medications  ipratropium-albuterol  (DUONEB) 0.5-2.5 (3) MG/3ML nebulizer solution 3 mL (has no administration in time range)  albuterol  (VENTOLIN  HFA) 108 (90 Base) MCG/ACT inhaler 1-2 puff (has no administration in time range)  aerochamber Z-Stat Plus/medium 1 each (has no administration in time range)  doxycycline  (VIBRA -TABS) tablet 100 mg (has no administration in time range)  ketorolac  (TORADOL ) 30 MG/ML injection 30 mg (30 mg Intramuscular Given 11/07/23 1102)  dexamethasone  (DECADRON ) injection 10 mg (10 mg Intramuscular Given 11/07/23 1101)    ED Course/ Medical Decision Making/ A&P                                 Medical Decision Making Amount and/or Complexity of Data Reviewed Radiology: ordered.  Risk Prescription drug management.   This patient presents to the ED for concern of sob, this involves an extensive number of treatment options, and is a complaint that carries with it a high risk of complications and morbidity.  The differential diagnosis includes covid/flu/rsv, pna, bronchitis   Co morbidities that complicate the patient evaluation  htn, depression, fibroids, ptsd and  pre-dm.   Additional history obtained:  Additional history obtained from epic chart review  Lab Tests:  I Ordered, and personally interpreted labs.  The pertinent results include:  + influenza A, neg covid/rsv   Imaging Studies ordered:  I ordered imaging studies including cxr  I independently visualized and interpreted imaging which showed  New parenchymal opacities in both lower lung zones which may  represent atelectasis but also are suspicious for developing  pneumonia, especially on the left.   I agree with the radiologist interpretation   Medicines ordered and prescription drug management:  I ordered medication including toradol /decadron /nebs  for sx  Reevaluation of the patient after these medicines showed that the patient improved I have reviewed the patients home medicines and have made adjustments as needed   Test Considered:  cxr   Critical Interventions:  nebs   Problem List / ED Course:  Influenza A with pna:  sx have been going on too long for tamiflu.  She is not hypxoic, but she does have pneumonia now, so she will be d/c with doxy.  She is also given a rx for tussionex for her cough.  She is encouraged to stop smoking.  She is given an inhaler + spacer and a rx for prednisone .  She is to return if worse.  F/u with pcp.   Reevaluation:  After the interventions noted above, I reevaluated the patient and found that they have :improved   Social Determinants of Health:  Lives at home   Dispostion:  After consideration of the diagnostic results and the patients response to treatment, I feel that the patent would benefit from discharge with outpatient f/u.          Final Clinical Impression(s) / ED Diagnoses Final diagnoses:  Influenza A  Community acquired pneumonia, unspecified laterality  Tobacco abuse    Rx / DC Orders ED Discharge Orders  Ordered    doxycycline  (VIBRAMYCIN ) 100 MG capsule  2 times daily         11/07/23 0949    chlorpheniramine-HYDROcodone  (TUSSIONEX) 10-8 MG/5ML  Every 12 hours PRN        11/07/23 0949    predniSONE  (DELTASONE ) 50 MG tablet  Daily with breakfast        11/07/23 0949              Dean Clarity, MD 11/07/23 1111

## 2023-11-07 NOTE — Discharge Instructions (Addendum)
Continue to try to stop smoking

## 2023-11-07 NOTE — ED Triage Notes (Addendum)
 Patient has had generalized body aches, fever of 100.10F at home, nonproductive cough and feels weak. Lack of appetite. Stated people at her work have pneumonia.
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
## 2023-11-12 ENCOUNTER — Telehealth: Payer: Self-pay | Admitting: Physical Medicine and Rehabilitation

## 2023-11-12 NOTE — Telephone Encounter (Signed)
 Patient called needing to reschedule her appointment to a different time same day. The number to contact patient is [phone]

## 2023-11-13 ENCOUNTER — Telehealth: Payer: Self-pay | Admitting: Physical Medicine and Rehabilitation

## 2023-11-14 ENCOUNTER — Ambulatory Visit: Payer: BC Managed Care – PPO

## 2023-11-14 ENCOUNTER — Encounter (HOSPITAL_COMMUNITY): Payer: BC Managed Care – PPO | Admitting: Psychiatry

## 2023-11-14 ENCOUNTER — Telehealth: Payer: Self-pay | Admitting: Physical Medicine and Rehabilitation

## 2023-11-14 ENCOUNTER — Encounter: Payer: Self-pay | Admitting: Radiology

## 2023-11-14 NOTE — Progress Notes (Signed)
This encounter was created in error - please disregard.

## 2023-11-14 NOTE — Telephone Encounter (Signed)
Patient called stating she need an exact date on the note for Human Resources. 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 Patient said she sees Dr. Alvester Morin on the 31st. The number to contact patient is  [phone]

## 2023-11-15 ENCOUNTER — Encounter: Payer: Self-pay | Admitting: Physical Medicine and Rehabilitation

## 2023-11-15 NOTE — Telephone Encounter (Signed)
I called patient. She states appointment has been changed already. She also states that she has been sending messages about a work note with restrictions and that the one she has does not have an end date. I advised Neysa Bonito entered note yesterday with end date. She is unable to pull this from My Chart. I advised I could email, however, she would like for me to print and she will come by the office and pick it up. I advised note will be available at check in desk on [place] side.

## 2023-11-21 ENCOUNTER — Encounter: Payer: BC Managed Care – PPO | Admitting: Physical Medicine and Rehabilitation

## 2023-11-28 ENCOUNTER — Other Ambulatory Visit: Payer: Self-pay

## 2023-11-28 ENCOUNTER — Ambulatory Visit: Payer: BC Managed Care – PPO | Admitting: Physical Medicine and Rehabilitation

## 2023-11-28 DIAGNOSIS — M47819 Spondylosis without myelopathy or radiculopathy, site unspecified: Secondary | ICD-10-CM | POA: Diagnosis not present

## 2023-11-28 MED ORDER — METHYLPREDNISOLONE ACETATE 40 MG/ML IJ SUSP
40.0000 mg | Freq: Once | INTRAMUSCULAR | Status: AC
  Administered 2023-11-28: 40 mg

## 2023-11-28 NOTE — Procedures (Signed)
Lumbar Facet Joint Intra-Articular Injection(s) with Fluoroscopic Guidance  Patient: Brianna Gutierrez      Date of Birth: [birth year] MRN: [id number] PCP: [name], NP      Visit Date: 11/28/2023   Universal Protocol:    Date/Time: 11/28/2023  Consent Given By: the patient  Position: PRONE   Additional Comments: Vital signs were monitored before and after the procedure. Patient was prepped and draped in the usual sterile fashion. The correct patient, procedure, and site was verified.   Injection Procedure Details:  Procedure Site One Meds Administered:  Meds ordered this encounter  Medications   methylPREDNISolone acetate (DEPO-MEDROL) injection 40 mg     Laterality: Bilateral  Location/Site:  L5-S1  Needle size: 22 guage  Needle type: Spinal  Needle Placement: Articular  Findings:  -Comments: Excellent flow of contrast producing a partial arthrogram.  Procedure Details: The fluoroscope beam is vertically oriented in AP, and the inferior recess is visualized beneath the lower pole of the inferior apophyseal process, which represents the target point for needle insertion. When direct visualization is difficult the target point is located at the medial projection of the vertebral pedicle. The region overlying each aforementioned target is locally anesthetized with a 1 to 2 ml. volume of 1% Lidocaine without Epinephrine.   The spinal needle was inserted into each of the above mentioned facet joints using biplanar fluoroscopic guidance. A 0.25 to 0.5 ml. volume of Isovue-250 was injected and a partial facet joint arthrogram was obtained. A single spot film was obtained of the resulting arthrogram.    One to 1.25 ml of the steroid/anesthetic solution was then injected into each of the facet joints noted above.   Additional Comments:  The patient tolerated the procedure well Dressing: 2 x 2 sterile gauze and Band-Aid    Post-procedure details: Patient was  observed during the procedure. Post-procedure instructions were reviewed.  Patient left the clinic in stable condition.

## 2023-11-28 NOTE — Progress Notes (Signed)
Functional Pain Scale - descriptive words and definitions  Uncomfortable (3)  Pain is present but can complete all ADL's/sleep is slightly affected and passive distraction only gives marginal relief. Mild range order  Average Pain 4  123/83 +Driver, -BT, -Dye Allergies.

## 2023-11-28 NOTE — Progress Notes (Signed)
Brianna Gutierrez - 48 y.o. female MRN [id number]  Date of birth: 04-May-1976  Office Visit Note: Visit Date: 11/28/2023 PCP: [name], NP Referred by: [name], NP  Subjective: Chief Complaint  Patient presents with   Lower Back - Pain   HPI:  Brianna Gutierrez is a 48 y.o. female who comes in today at the request of Ellin Goodie, FNP for planned Bilateral  L5-S1 Lumbar facet/medial branch block with fluoroscopic guidance.  The patient has failed conservative care including home exercise, medications, time and activity modification.  This injection will be diagnostic and hopefully therapeutic.  Please see requesting physician notes for further details and justification.  Exam has shown concordant pain with facet joint loading.   ROS Otherwise per HPI.  Assessment & Plan: Visit Diagnoses:    ICD-10-CM   1. Spondylosis without myelopathy or radiculopathy  M47.819 XR C-ARM NO REPORT    Facet Injection    methylPREDNISolone acetate (DEPO-MEDROL) injection 40 mg      Plan: No additional findings.   Meds & Orders:  Meds ordered this encounter  Medications   methylPREDNISolone acetate (DEPO-MEDROL) injection 40 mg    Orders Placed This Encounter  Procedures   Facet Injection   XR C-ARM NO REPORT    Follow-up: Return if symptoms worsen or fail to improve.   Procedures: No procedures performed  Lumbar Facet Joint Intra-Articular Injection(s) with Fluoroscopic Guidance  Patient: Brianna Gutierrez      Date of Birth: 01/26/1976 MRN: [id number] PCP: [name], NP      Visit Date: 11/28/2023   Universal Protocol:    Date/Time: 11/28/2023  Consent Given By: the patient  Position: PRONE   Additional Comments: Vital signs were monitored before and after the procedure. Patient was prepped and draped in the usual sterile fashion. The correct patient, procedure, and site was verified.   Injection Procedure Details:  Procedure Site One Meds  Administered:  Meds ordered this encounter  Medications   methylPREDNISolone acetate (DEPO-MEDROL) injection 40 mg     Laterality: Bilateral  Location/Site:  L5-S1  Needle size: 22 guage  Needle type: Spinal  Needle Placement: Articular  Findings:  -Comments: Excellent flow of contrast producing a partial arthrogram.  Procedure Details: The fluoroscope beam is vertically oriented in AP, and the inferior recess is visualized beneath the lower pole of the inferior apophyseal process, which represents the target point for needle insertion. When direct visualization is difficult the target point is located at the medial projection of the vertebral pedicle. The region overlying each aforementioned target is locally anesthetized with a 1 to 2 ml. volume of 1% Lidocaine without Epinephrine.   The spinal needle was inserted into each of the above mentioned facet joints using biplanar fluoroscopic guidance. A 0.25 to 0.5 ml. volume of Isovue-250 was injected and a partial facet joint arthrogram was obtained. A single spot film was obtained of the resulting arthrogram.    One to 1.25 ml of the steroid/anesthetic solution was then injected into each of the facet joints noted above.   Additional Comments:  The patient tolerated the procedure well Dressing: 2 x 2 sterile gauze and Band-Aid    Post-procedure details: Patient was observed during the procedure. Post-procedure instructions were reviewed.  Patient left the clinic in stable condition.    Clinical History: CLINICAL DATA:  Low back pain   EXAM: MRI LUMBAR SPINE WITHOUT CONTRAST   TECHNIQUE: Multiplanar, multisequence MR imaging of the lumbar spine  was performed. No intravenous contrast was administered.   COMPARISON:  CT scan 02/03/2023   FINDINGS: Segmentation: The lowest lumbar type non-rib-bearing vertebra is labeled as L5.   Alignment:  No vertebral subluxation is observed.   Vertebrae: Mild facet edema on the  right at L5-S1 and bilaterally at L4-5. Mild prominence of epidural adipose tissue in the lower lumbar spine with mild congenitally shortened pedicles.   Conus medullaris and cauda equina: Conus extends to the lower L2 level, mildly abnormally low. No tethering mass observed. Conus and cauda equina appear otherwise normal.   Paraspinal and other soft tissues: Fluid signal intensity lesion of the right kidney is most compatible with a simple cyst. No further imaging workup of this lesion is indicated. Small retroperitoneal cyst posterior to the right adrenal gland, previously worked up on prior abdominal MRI, benign. No further imaging workup of this lesion is indicated.   Disc levels:   L1-2: Unremarkable   L2-3: Unremarkable   L3-4: Unremarkable   L4-5: Mild left foraminal stenosis along with mild right eccentric central narrowing of the thecal sac and mild displacement of the left L4 nerve in the lateral extraforaminal space as well as borderline right subarticular lateral recess stenosis due to right eccentric disc bulge and degenerative facet arthropathy along with a small left foraminal disc protrusion.   L5-S1: No impingement. Substantial degenerative right facet arthropathy.   IMPRESSION: 1. Lumbar spondylosis and degenerative disc disease, causing mild impingement at L4-5. 2. Substantial degenerative right facet arthropathy at L5-S1. 3. Mild facet edema on the right at L5-S1 and bilaterally at L4-5. 4. Conus extends to the lower L2 level, mildly abnormally low. No tethering mass observed.     Electronically Signed   By: Gaylyn Rong M.D.   On: 10/14/2023 15:42     Objective:  VS:  HT:    WT:   BMI:     BP:   HR: bpm  TEMP: ( )  RESP:  Physical Exam Vitals and nursing note reviewed.  Constitutional:      General: She is not in acute distress.    Appearance: Normal appearance. She is obese. She is not ill-appearing.  HENT:     Head:  Normocephalic and atraumatic.     Right Ear: External ear normal.     Left Ear: External ear normal.  Eyes:     Extraocular Movements: Extraocular movements intact.  Cardiovascular:     Rate and Rhythm: Normal rate.     Pulses: Normal pulses.  Pulmonary:     Effort: Pulmonary effort is normal. No respiratory distress.  Abdominal:     General: There is no distension.     Palpations: Abdomen is soft.  Musculoskeletal:        General: Tenderness present.     Cervical back: Neck supple.     Right lower leg: No edema.     Left lower leg: No edema.     Comments: Patient has good distal strength with no pain over the greater trochanters.  No clonus or focal weakness.  Skin:    Findings: No erythema, lesion or rash.  Neurological:     General: No focal deficit present.     Mental Status: She is alert and oriented to person, place, and time.     Sensory: No sensory deficit.     Motor: No weakness or abnormal muscle tone.     Coordination: Coordination normal.  Psychiatric:        Mood and  Affect: Mood normal.        Behavior: Behavior normal.      Imaging: No results found.

## 2023-11-28 NOTE — Patient Instructions (Signed)

## 2023-12-05 ENCOUNTER — Ambulatory Visit: Payer: BC Managed Care – PPO

## 2023-12-05 DIAGNOSIS — L501 Idiopathic urticaria: Secondary | ICD-10-CM | POA: Diagnosis not present

## 2023-12-24 DIAGNOSIS — G4733 Obstructive sleep apnea (adult) (pediatric): Secondary | ICD-10-CM | POA: Diagnosis not present

## 2023-12-25 ENCOUNTER — Ambulatory Visit: Payer: BC Managed Care – PPO | Admitting: Allergy

## 2024-01-08 ENCOUNTER — Telehealth: Payer: Self-pay | Admitting: Physical Medicine and Rehabilitation

## 2024-01-08 ENCOUNTER — Ambulatory Visit (INDEPENDENT_AMBULATORY_CARE_PROVIDER_SITE_OTHER): Payer: BC Managed Care – PPO | Admitting: Family

## 2024-01-08 ENCOUNTER — Encounter: Payer: Self-pay | Admitting: Family

## 2024-01-08 VITALS — BP 122/87 | HR 102 | Temp 98.4°F | Ht 62.0 in | Wt 197.0 lb

## 2024-01-08 DIAGNOSIS — N951 Menopausal and female climacteric states: Secondary | ICD-10-CM | POA: Diagnosis not present

## 2024-01-08 DIAGNOSIS — G629 Polyneuropathy, unspecified: Secondary | ICD-10-CM | POA: Diagnosis not present

## 2024-01-08 DIAGNOSIS — I10 Essential (primary) hypertension: Secondary | ICD-10-CM | POA: Diagnosis not present

## 2024-01-08 DIAGNOSIS — R7303 Prediabetes: Secondary | ICD-10-CM

## 2024-01-08 MED ORDER — METFORMIN HCL 500 MG PO TABS: 500.0000 mg | ORAL_TABLET | Freq: Two times a day (BID) | ORAL | 0 refills | Status: DC

## 2024-01-08 MED ORDER — HYDROCHLOROTHIAZIDE 25 MG PO TABS: 25.0000 mg | ORAL_TABLET | Freq: Every day | ORAL | 0 refills | Status: DC

## 2024-01-08 MED ORDER — METFORMIN HCL 500 MG PO TABS
500.0000 mg | ORAL_TABLET | Freq: Two times a day (BID) | ORAL | 0 refills | Status: DC
Start: 2024-01-08 — End: 2024-01-08

## 2024-01-08 MED ORDER — PAROXETINE MESYLATE 7.5 MG PO CAPS: 7.5000 mg | ORAL_CAPSULE | Freq: Every evening | ORAL | 1 refills | Status: DC | PRN

## 2024-01-08 MED ORDER — HYDROCHLOROTHIAZIDE 25 MG PO TABS: 25.0000 mg | ORAL_TABLET | Freq: Every day | ORAL | 0 refills | Status: AC

## 2024-01-08 MED ORDER — AMLODIPINE BESYLATE 10 MG PO TABS: 10.0000 mg | ORAL_TABLET | Freq: Every evening | ORAL | 0 refills | Status: AC

## 2024-01-08 MED ORDER — AMLODIPINE BESYLATE 10 MG PO TABS
10.0000 mg | ORAL_TABLET | Freq: Every evening | ORAL | 0 refills | Status: DC
Start: 2024-01-08 — End: 2024-01-08

## 2024-01-08 MED ORDER — GABAPENTIN 300 MG PO CAPS: 300.0000 mg | ORAL_CAPSULE | Freq: Every day | ORAL | 0 refills | Status: AC

## 2024-01-08 NOTE — Telephone Encounter (Signed)
 Patient called. Says she is still hurting. Her cb# is [phone]

## 2024-01-08 NOTE — Progress Notes (Signed)
 Patient ID: Brianna Gutierrez, female    DOB: October 02, 1976  MRN: [id number]  CC: Chronic Conditions Follow-Up  Subjective: Brianna Gutierrez is a 47 y.o. female who presents for chronic conditions follow-up.   Her concerns today include:  - Doing well on Amlodipine and Hydrochlorothiazide, no issues/concerns. She does not complain of red flag symptoms such as but not limited to chest pain, shortness of breath, worst headache of life, nausea/vomiting.  - Doing well on Metformin, no issues/concerns. - Doing well on Gabapentin, no issues/concerns.  - States she never picked up Paroxetine Mesylate from pharmacy because it wasn't available. Since previous office visit she has not established with Gynecology.    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 2 each 2   lamoTRIgine (LAMICTAL) 100 MG tablet Take 1 tablet (100 mg total) by mouth daily. 90 tablet 0   predniSONE (DELTASONE) 50 MG tablet Take 1 tablet (50 mg total)  by mouth daily with breakfast. 5 tablet 0   traZODone (DESYREL) 100 MG tablet Take 1 tablet (100 mg total) by mouth at bedtime. Take an additional 50 mg as needed if not asleep in 30 minutes 135 tablet 0   XOLAIR 150 MG/ML prefilled syringe INJECT 300MG  SUBCUTANEOUSLY  EVERY 4 WEEKS 2 mL 11   [DISCONTINUED] dicyclomine (BENTYL) 20 MG tablet Take 1 tablet (20 mg total) by mouth 2 (two) times daily. 20 tablet 0   [DISCONTINUED] omeprazole (PRILOSEC) 20 MG capsule Take 1 capsule (20 mg total) by mouth daily. 30 capsule 1   Current Facility-Administered Medications on File Prior to Visit  Medication Dose Route Frequency Provider Last Rate Last Admin   omalizumab Geoffry Paradise) prefilled syringe 300 mg  300 mg Subcutaneous Q28 days Nehemiah Settle, FNP   300 mg at 12/05/23 1447    Allergies  Allergen Reactions   Pork-Derived Products    Tramadol     rash    Social History   Socioeconomic History   Marital status: Legally Separated    Spouse name: Not on file   Number of children: Not on file   Years of education: Not on file   Highest education level: 12th grade  Occupational History   Not on file  Tobacco Use   Smoking status: Every Day    Current packs/day: 0.50    Average packs/day: 0.5 packs/day for 20.0 years (10.0 ttl pk-yrs)    Types: Cigarettes    Passive exposure: Never   Smokeless tobacco: Never  Vaping Use   Vaping status: Former  Substance and Sexual Activity  Alcohol use: Not Currently   Drug use: Never   Sexual activity: Not Currently    Partners: Male    Birth control/protection: Surgical    Comment: [facility] 09/11/21  Other Topics Concern   Not on file  Social History Narrative   ** Merged History Encounter **       Social Drivers of Health   Financial Resource Strain: Medium Risk (01/07/2024)   Overall Financial Resource Strain (CARDIA)    Difficulty of Paying Living Expenses: Somewhat hard  Food Insecurity: Food Insecurity Present (01/07/2024)   Hunger Vital  Sign    Worried About Running Out of Food in the Last Year: Sometimes true    Ran Out of Food in the Last Year: Often true  Transportation Needs: Unmet Transportation Needs (01/07/2024)   PRAPARE - Transportation    Lack of Transportation (Medical): Yes    Lack of Transportation (Non-Medical): Yes  Physical Activity: Insufficiently Active (01/07/2024)   Exercise Vital Sign    Days of Exercise per Week: 3 days    Minutes of Exercise per Session: 20 min  Stress: No Stress Concern Present (01/07/2024)   [employer] of Occupational Health - Occupational Stress Questionnaire    Feeling of Stress : Only a little  Social Connections: Socially Isolated (01/07/2024)   Social Connection and Isolation Panel [NHANES]    Frequency of Communication with Friends and Family: More than three times a week    Frequency of Social Gatherings with Friends and Family: Once a week    Attends Religious Services: Never    Database administrator or Organizations: No    Attends Engineer, structural: Not on file    Marital Status: Separated  Intimate Partner Violence: Not on file    Family History  Problem Relation Age of Onset   Colon polyps Sister    Hypertension Maternal Grandmother    Diabetes Maternal Grandfather    Allergic rhinitis Daughter    Allergic rhinitis Daughter    Allergic rhinitis Son    Allergic rhinitis Son    Colon cancer Neg Hx    Esophageal cancer Neg Hx    Rectal cancer Neg Hx    Stomach cancer Neg Hx     Past Surgical History:  Procedure Laterality Date   BLADDER SUSPENSION N/A 09/11/2021   Procedure: TRANSVAGINAL TAPE (TVT) PROCEDURE;  Surgeon: Patton Salles, MD;  Location: [facility];  Service: Gynecology;  Laterality: N/A;   CESAREAN SECTION  1997   x 1   CHOLECYSTECTOMY N/A 01/14/2019   Procedure: LAPAROSCOPIC CHOLECYSTECTOMY WITH POSSIBLE  INTRAOPERATIVE CHOLANGIOGRAM;  Surgeon: Darnell Level, MD;  Location: [facility];  Service:  General;  Laterality: N/A;   CYSTOSCOPY N/A 09/11/2021   Procedure: Bluford Kaufmann;  Surgeon: Patton Salles, MD;  Location: [facility];  Service: Gynecology;  Laterality: N/A;   CYSTOSCOPY/URETEROSCOPY/HOLMIUM LASER Left 01/31/2022   Procedure: LEFT URETEROSCOPY/HOLMIUM LASER;  Surgeon: Despina Arias, MD;  Location: [facility];  Service: Urology;  Laterality: Left;   DILITATION & CURRETTAGE/HYSTROSCOPY WITH HYDROTHERMAL ABLATION N/A 06/22/2016   Procedure: DILATATION & CURETTAGE/HYSTEROSCOPY WITH HYDROTHERMAL ABLATION;  Surgeon: Brock Bad, MD;  Location: [facility];  Service: Gynecology;  Laterality: N/A;   TOTAL LAPAROSCOPIC HYSTERECTOMY WITH SALPINGECTOMY Bilateral 09/11/2021   Procedure: TOTAL LAPAROSCOPIC HYSTERECTOMY WITH SALPINGECTOMY;  Surgeon: Patton Salles, MD;  Location: [facility];  Service: Gynecology;  Laterality: Bilateral;   TUBAL LIGATION  09-28-2015    ROS: Review of Systems Negative except as stated above  PHYSICAL EXAM: BP 122/87   Pulse (!) 102   Temp 98.4 F (36.9 C) (Oral)   Ht 5\' 2"  (1.575 m)   Wt 197 lb (89.4 kg)   LMP  (LMP Unknown)   SpO2 93%   BMI 36.03 kg/m   Physical Exam HENT:     Head: Normocephalic and atraumatic.     Nose: Nose normal.     Mouth/Throat:     Mouth: Mucous membranes are moist.     Pharynx: Oropharynx is clear.  Eyes:     Extraocular Movements: Extraocular movements intact.     Conjunctiva/sclera: Conjunctivae normal.     Pupils: Pupils are equal, round, and reactive to light.  Cardiovascular:     Rate and Rhythm: Tachycardia present.     Pulses: Normal pulses.     Heart sounds: Normal heart sounds.  Pulmonary:     Effort: Pulmonary effort is normal.     Breath sounds: Normal breath sounds.  Musculoskeletal:        General: Normal range of motion.     Cervical back: Normal range of motion and neck supple.  Neurological:     General: No focal deficit present.      Mental Status: She is alert and oriented to person, place, and time.  Psychiatric:        Mood and Affect: Mood normal.        Behavior: Behavior normal.     ASSESSMENT AND PLAN: 1. Primary hypertension (Primary) - Continue Amlodipine and Hydrochlorothiazide as prescribed.  - Routine screening.  - Counseled on blood pressure goal of less than 130/80, low-sodium, DASH diet, medication compliance, and 150 minutes of moderate intensity exercise per week as tolerated. Counseled on medication adherence and adverse effects. - Follow-up with primary provider in 3 months or sooner if needed.  - amLODipine (NORVASC) 10 MG tablet; Take 1 tablet (10 mg total) by mouth every evening.  Dispense: 90 tablet; Refill: 0 - hydrochlorothiazide (HYDRODIURIL) 25 MG tablet; Take 1 tablet (25 mg total) by mouth daily.  Dispense: 90 tablet; Refill: 0 - Basic Metabolic Panel  2. Prediabetes - Continue Metformin as prescribed. Counseled on medication adherence/adverse effects. - Hemoglobin A1c result pending. - Follow-up with primary provider as scheduled. - metFORMIN (GLUCOPHAGE) 500 MG tablet; Take 1 tablet (500 mg total) by mouth 2 (two) times daily with a meal.  Dispense: 180 tablet; Refill: 0 - Hemoglobin A1c  3. Neuropathy - Continue Gabapentin as prescribed. Counseled on medication adherence/adverse effects.  - Follow-up with primary provider in 3 months or sooner if needed.  - gabapentin (NEURONTIN) 300 MG capsule; Take 1 capsule (300 mg total) by mouth at bedtime.  Dispense: 90 capsule; Refill: 0  4. Vasomotor symptoms due to menopause - Paroxetine Mesylate as prescribed. Counseled on medication adherence/adverse effects.  - Referral to Gynecology for evaluation/management. - Follow-up with primary provider as scheduled. - PARoxetine Mesylate 7.5 MG CAPS; Take 7.5 mg by mouth at bedtime as needed.  Dispense: 30 capsule; Refill: 1 - Ambulatory referral to Gynecology   Patient was given the  opportunity to ask questions.  Patient verbalized understanding of the plan and was able to repeat key elements of the plan. Patient was given clear instructions to go to Emergency Department or return to medical center if symptoms don't improve, worsen, or new problems develop.The patient verbalized understanding.   Orders Placed This Encounter  Procedures  Basic Metabolic Panel   Hemoglobin A1c   Ambulatory referral to Gynecology     Requested Prescriptions   Signed Prescriptions Disp Refills   gabapentin (NEURONTIN) 300 MG capsule 90 capsule 0    Sig: Take 1 capsule (300 mg total) by mouth at bedtime.   PARoxetine Mesylate 7.5 MG CAPS 30 capsule 1    Sig: Take 7.5 mg by mouth at bedtime as needed.   amLODipine (NORVASC) 10 MG tablet 90 tablet 0    Sig: Take 1 tablet (10 mg total) by mouth every evening.   hydrochlorothiazide (HYDRODIURIL) 25 MG tablet 90 tablet 0    Sig: Take 1 tablet (25 mg total) by mouth daily.   metFORMIN (GLUCOPHAGE) 500 MG tablet 180 tablet 0    Sig: Take 1 tablet (500 mg total) by mouth 2 (two) times daily with a meal.    Return in about 3 months (around 04/09/2024) for Follow-Up or next available chronic conditions.  [name], NP

## 2024-01-08 NOTE — Progress Notes (Signed)
 Patient states no concerns to discuss.

## 2024-01-09 ENCOUNTER — Ambulatory Visit: Payer: BC Managed Care – PPO

## 2024-01-09 DIAGNOSIS — L501 Idiopathic urticaria: Secondary | ICD-10-CM

## 2024-01-16 ENCOUNTER — Encounter (HOSPITAL_COMMUNITY): Payer: Self-pay

## 2024-01-16 ENCOUNTER — Other Ambulatory Visit: Payer: Self-pay

## 2024-01-16 ENCOUNTER — Emergency Department (HOSPITAL_COMMUNITY)
Admission: EM | Admit: 2024-01-16 | Discharge: 2024-01-16 | Disposition: A | Discharge status: Home / Self Care | Attending: Emergency Medicine | Admitting: Emergency Medicine

## 2024-01-16 DIAGNOSIS — M5442 Lumbago with sciatica, left side: Secondary | ICD-10-CM | POA: Diagnosis not present

## 2024-01-16 DIAGNOSIS — I10 Essential (primary) hypertension: Secondary | ICD-10-CM | POA: Insufficient documentation

## 2024-01-16 DIAGNOSIS — G8929 Other chronic pain: Secondary | ICD-10-CM | POA: Insufficient documentation

## 2024-01-16 DIAGNOSIS — M545 Low back pain, unspecified: Secondary | ICD-10-CM | POA: Diagnosis not present

## 2024-01-16 DIAGNOSIS — Z79899 Other long term (current) drug therapy: Secondary | ICD-10-CM | POA: Insufficient documentation

## 2024-01-16 DIAGNOSIS — M5441 Lumbago with sciatica, right side: Secondary | ICD-10-CM | POA: Insufficient documentation

## 2024-01-16 MED ORDER — PREDNISONE 10 MG PO TABS: ORAL_TABLET | ORAL | 0 refills | Status: AC

## 2024-01-16 NOTE — Discharge Instructions (Signed)
 Please engage in light physical activity (like walking) to prevent your back pain from worsening and to prevent stiffness. Refrain from bedrest which can make your pain worse.   You may use up to 600mg  ibuprofen every 6 hours as needed for pain.  Do not exceed 2.4g of ibuprofen per day.  Alternatively, you may take up to 1000mg  of tylenol every 6 hours as needed for pain.  Do not take more then 4g per day.  You may use a heating back on your back to help with the pain.  You have been prescribed a prednisone taper to help reduce the severity of your pain.  Please take this first thing morning as prescribed.  This medication can keep you up at night.  This medication can also raise your blood sugars.   Please follow-up with your orthopedic provider soon as possible for further management.  Return to the ER if you have loss of bowel or bladder control, you develop fever or chills, you become very dizzy, you have shortness of breath, chest pain.

## 2024-01-16 NOTE — ED Provider Notes (Signed)
 [place] EMERGENCY DEPARTMENT AT [facility] Provider Note   CSN: [id number] Arrival date & time: 01/16/24  2035     History  Chief Complaint  Patient presents with   Back Pain    Brianna Gutierrez is a 48 y.o. female with history of chronic low back pain, hypertension, presents with concern for lower back pain that has been ongoing for months.  She also notes pain traveling down into the legs bilaterally.  Denies any trauma or falls.  She denies any weakness, saddle anesthesia, loss of bowel or bladder control, urinary retention, IV drug use, fevers, or history of malignancy.  She states she is being followed by orthopedics for this problem, and they have tried cortisone shots, muscle relaxers, Tylenol and ibuprofen, which do not provide significant relief.  She has already gotten MRI imaging for this problem by her orthopedic provider   Back Pain      Home Medications Prior to Admission medications   Medication Sig Start Date End Date Taking? Authorizing Provider  predniSONE (DELTASONE) 10 MG tablet Take 4 tablets (40 mg total) by mouth daily with breakfast for 2 days, THEN 3 tablets (30 mg total) daily with breakfast for 2 days, THEN 2 tablets (20 mg total) daily with breakfast for 2 days, THEN 1 tablet (10 mg total) daily with breakfast for 1 day. 01/17/24 01/24/24 Yes Arabella Merles, PA-C  amLODipine (NORVASC) 10 MG tablet Take 1 tablet (10 mg total) by mouth every evening. 01/08/24 04/07/24  [name], NP  EPINEPHrine (EPIPEN 2-PAK) 0.3 mg/0.3 mL IJ SOAJ injection Inject 0.3 mg into the muscle as needed for anaphylaxis. 08/29/23   Marcelyn Bruins, MD  gabapentin (NEURONTIN) 300 MG capsule Take 1 capsule (300 mg total) by mouth at bedtime. 01/08/24   [name], NP  hydrochlorothiazide (HYDRODIURIL) 25 MG tablet Take 1 tablet (25 mg total) by mouth daily. 01/08/24 04/07/24  [name], NP  lamoTRIgine (LAMICTAL) 100 MG tablet Take 1 tablet  (100 mg total) by mouth daily. 08/29/23 01/08/24  Stasia Cavalier, MD  metFORMIN (GLUCOPHAGE) 500 MG tablet Take 1 tablet (500 mg total) by mouth 2 (two) times daily with a meal. 01/08/24 04/07/24  [name], NP  PARoxetine Mesylate 7.5 MG CAPS Take 7.5 mg by mouth at bedtime as needed. 01/08/24   [name], NP  traZODone (DESYREL) 100 MG tablet Take 1 tablet (100 mg total) by mouth at bedtime. Take an additional 50 mg as needed if not asleep in 30 minutes 08/29/23   Stasia Cavalier, MD  Geoffry Paradise 150 MG/ML prefilled syringe INJECT 300MG  SUBCUTANEOUSLY  EVERY 4 WEEKS 08/27/23   Ellamae Sia, DO  dicyclomine (BENTYL) 20 MG tablet Take 1 tablet (20 mg total) by mouth 2 (two) times daily. 10/29/19 08/24/20  Belinda Fisher, PA-C  omeprazole (PRILOSEC) 20 MG capsule Take 1 capsule (20 mg total) by mouth daily. 01/01/19 08/07/19  Bing Neighbors, NP      Allergies    Pork-derived products and Tramadol    Review of Systems   Review of Systems  Musculoskeletal:  Positive for back pain.    Physical Exam Updated Vital Signs BP 125/87   Pulse 88   Temp 98.3 F (36.8 C) (Oral)   Resp 18   Ht 5\' 2"  (1.575 m)   Wt 86.2 kg   LMP  (LMP Unknown)   SpO2 97%   BMI 34.75 kg/m  Physical Exam Vitals and  nursing note reviewed.  Constitutional:      General: She is not in acute distress.    Appearance: She is well-developed.  HENT:     Head: Normocephalic and atraumatic.  Eyes:     Conjunctiva/sclera: Conjunctivae normal.  Cardiovascular:     Rate and Rhythm: Normal rate and regular rhythm.     Heart sounds: No murmur heard.    Comments: Intact cap refill of the toes bilaterally, 2+ dorsalis pedis pulse bilaterally Pulmonary:     Effort: Pulmonary effort is normal. No respiratory distress.     Breath sounds: Normal breath sounds.  Abdominal:     Palpations: Abdomen is soft.     Tenderness: There is no abdominal tenderness.  Musculoskeletal:        General: No swelling.     Cervical back:  Neck supple.     Comments: No obvious deformity, erythema, edema.  No tenderness palpation of the cervical, thoracic, or lumbar spine.  Tender to palpation over the musculature of the lower back  Skin:    General: Skin is warm and dry.     Capillary Refill: Capillary refill takes less than 2 seconds.  Neurological:     Mental Status: She is alert.     Comments: 5/5 strength with resisted hip flexion, knee flexion and extension, ankle plantarflexion and dorsiflexion  Intact sensation of the bilateral lower extremities  Psychiatric:        Mood and Affect: Mood normal.     ED Results / Procedures / Treatments   Labs (all labs ordered are listed, but only abnormal results are displayed) Labs Reviewed - No data to display  EKG None  Radiology No results found.  Procedures Procedures    Medications Ordered in ED Medications - No data to display  ED Course/ Medical Decision Making/ A&P                                 Medical Decision Making    Differential diagnosis includes but is not limited to musculoskeletal pain, sciatica, radiculopathy, epidural abscess, cauda equina  ED Course:  Patient well appearing, stable vitals. Patient presenting with chronic low back pain ongoing for the past couple of months.  She now reports some pain going into her bilateral legs.  Denies any weakness, bowel or bladder incontinence, urinary retention, saddle anesthesia, IV drug use, or history of malignancy.  No new trauma or falls.  No spinal tenderness to palpation, no indication for imaging at this time.  She is tender to palpation over the lower back musculature into the gluteal region bilaterally.  5/5 strength of the bilateral lower extremities and intact sensation.  Discussed that this is consistent with likely sciatica.    She states she has already tried a muscle relaxer, Tylenol, and ibuprofen from her orthopedic provider. She has already tried PT and had an MRI performed by  orthopedics for her chronic back pain.  She states she is prediabetic.  We discussed that we could try a short prednisone taper.  She states she has tried this before, but would like to try this again.  This has been prescribed for the patient.  Patient stable and appropriate for discharge home this time  Impression: Chronic low back pain with bilateral sciatica  Disposition:  The patient was discharged home with instructions to take prednisone taper as prescribed.  Follow-up with orthopedic provider soon as possible for further management.  May take Tylenol and ibuprofen as needed for pain.   Return precautions given.    External records from outside source obtained and reviewed including orthopedic visit from 10/29/2023 for chronic bilateral low back pain..  She is already tried cortisone shots in her low back and has had MRI performed              Final Clinical Impression(s) / ED Diagnoses Final diagnoses:  Chronic midline low back pain with bilateral sciatica    Rx / DC Orders ED Discharge Orders          Ordered    predniSONE (DELTASONE) 10 MG tablet  Q breakfast        01/16/24 2213              Arabella Merles, PA-C 01/16/24 2220    Benjiman Core, MD 01/16/24 [phone]

## 2024-01-16 NOTE — ED Triage Notes (Signed)
 Pt reports lower back pain radiating down both legs x months gradually worsening. Pt reports going to provider for it and being diagnosed with arthritis and has been receiving cortisone injections that aren't working. Pt denies any loss of control of bowel/bladder.

## 2024-01-21 ENCOUNTER — Encounter: Payer: Self-pay | Admitting: Physical Medicine and Rehabilitation

## 2024-01-21 ENCOUNTER — Ambulatory Visit: Admitting: Physical Medicine and Rehabilitation

## 2024-01-21 DIAGNOSIS — G8929 Other chronic pain: Secondary | ICD-10-CM

## 2024-01-21 DIAGNOSIS — M5416 Radiculopathy, lumbar region: Secondary | ICD-10-CM

## 2024-01-21 DIAGNOSIS — M5441 Lumbago with sciatica, right side: Secondary | ICD-10-CM

## 2024-01-21 DIAGNOSIS — M5442 Lumbago with sciatica, left side: Secondary | ICD-10-CM | POA: Diagnosis not present

## 2024-01-21 DIAGNOSIS — M47816 Spondylosis without myelopathy or radiculopathy, lumbar region: Secondary | ICD-10-CM

## 2024-01-21 DIAGNOSIS — G894 Chronic pain syndrome: Secondary | ICD-10-CM

## 2024-01-21 NOTE — Progress Notes (Unsigned)
 Brianna Gutierrez - 48 y.o. female MRN [id number]  Date of birth: October 11, 1976  Office Visit Note: Visit Date: 01/21/2024 PCP: [name], NP Referred by: [name], NP  Subjective: Chief Complaint  Patient presents with   Lower Back - Pain   HPI: Brianna Gutierrez Brianna Gutierrez is a 48 y.o. female who comes in today for evaluation of chronic, worsening and severe bilateral lower back pain radiating down both lateral legs. We have seen her in the past for more axial back pain. She reports pain radiating down the legs started about 2 weeks ago. She was evaluated in the emergency department on 01/16/2024, was prescribed Prednisone and discharged home. Voiced that she did not take Prednisone because this medication has not helped her in the past. Her pain worsens with walking and activity. She describes pain as sore and aching sensation, currently rates as 8 out of 10. Some relief of pain with home exercise regimen, rest and use of medications. History of formal physical therapy with some relief of pain. Lumbar MRI imaging from 2024 shows lumbar spondylosis and degenerative disc disease, causing mild impingement at L4-L5. There is substantial degenerative right facet arthropathy at L5-S1. No high grade spinal canal stenosis noted. She underwent bilateral L5-S1 facet joint injections in our office on 11/28/2023, she reports less than 50% relief of pain for several weeks. Overall, she does not feel facet joint injections were beneficial in alleviating her pain. She is currently working at [employer]. Patient denies focal weakness, numbness and tingling. No recent trauma or falls.       Review of Systems  Musculoskeletal:  Positive for back pain and myalgias.  Neurological:  Negative for tingling, sensory change, focal weakness and weakness.  All other systems reviewed and are negative.  Otherwise per HPI.  Assessment & Plan: Visit Diagnoses:    ICD-10-CM   1. Chronic bilateral low back pain  with bilateral sciatica  M54.42    M54.41    G89.29     2. Lumbar radiculopathy  M54.16     3. Facet arthropathy, lumbar  M47.816     4. Chronic pain syndrome  G89.4        Plan: Findings:  Chronic, worsening and severe bilateral lower back pain radiating down both lateral legs. Patient continues to have severe pain despite good conservative therapies such as formal physical therapy, home exercise regimen, rest and use of medications. Patients clinical presentation and exam are complex, her lower back pain does seem to be more facet mediated and there is substantial facet arthropathy on the right at L5-S1, however minimal relief of pain with previous facet joint injections. Her pain is now radiating down lateral aspects of both legs, more of L5 distribution. There is mild nerve impingement at the level of L4-L5. We discussed treatment plan in detail today. I recommended trying lumbar epidural steroid injection, however she does not wish to continue with interventional treatments. She would like referral for chronic pain management. I placed referral to [facility] today. Should she change her mind regarding injection we are happy to see her back. No red flag symptoms noted upon exam today.     Meds & Orders: No orders of the defined types were placed in this encounter.  No orders of the defined types were placed in this encounter.   Follow-up: Return for Referral to [facility] for chronic pain management.   Procedures: No procedures performed      Clinical History:  CLINICAL DATA:  Low back pain   EXAM: MRI LUMBAR SPINE WITHOUT CONTRAST   TECHNIQUE: Multiplanar, multisequence MR imaging of the lumbar spine was performed. No intravenous contrast was administered.   COMPARISON:  CT scan 02/03/2023   FINDINGS: Segmentation: The lowest lumbar type non-rib-bearing vertebra is labeled as L5.   Alignment:  No vertebral subluxation is observed.   Vertebrae:  Mild facet edema on the right at L5-S1 and bilaterally at L4-5. Mild prominence of epidural adipose tissue in the lower lumbar spine with mild congenitally shortened pedicles.   Conus medullaris and cauda equina: Conus extends to the lower L2 level, mildly abnormally low. No tethering mass observed. Conus and cauda equina appear otherwise normal.   Paraspinal and other soft tissues: Fluid signal intensity lesion of the right kidney is most compatible with a simple cyst. No further imaging workup of this lesion is indicated. Small retroperitoneal cyst posterior to the right adrenal gland, previously worked up on prior abdominal MRI, benign. No further imaging workup of this lesion is indicated.   Disc levels:   L1-2: Unremarkable   L2-3: Unremarkable   L3-4: Unremarkable   L4-5: Mild left foraminal stenosis along with mild right eccentric central narrowing of the thecal sac and mild displacement of the left L4 nerve in the lateral extraforaminal space as well as borderline right subarticular lateral recess stenosis due to right eccentric disc bulge and degenerative facet arthropathy along with a small left foraminal disc protrusion.   L5-S1: No impingement. Substantial degenerative right facet arthropathy.   IMPRESSION: 1. Lumbar spondylosis and degenerative disc disease, causing mild impingement at L4-5. 2. Substantial degenerative right facet arthropathy at L5-S1. 3. Mild facet edema on the right at L5-S1 and bilaterally at L4-5. 4. Conus extends to the lower L2 level, mildly abnormally low. No tethering mass observed.     Electronically Signed   By: Gaylyn Rong M.D.   On: 10/14/2023 15:42   She reports that she has been smoking cigarettes. She has a 10 pack-year smoking history. She has never been exposed to tobacco smoke. She has never used smokeless tobacco.  Recent Labs    07/08/23 1619  HGBA1C 6.4*    Objective:  VS:  HT:    WT:   BMI:     BP:    HR: bpm  TEMP: ( )  RESP:  Physical Exam Vitals and nursing note reviewed.  HENT:     Head: Normocephalic and atraumatic.     Right Ear: External ear normal.     Left Ear: External ear normal.     Nose: Nose normal.     Mouth/Throat:     Mouth: Mucous membranes are moist.  Eyes:     Extraocular Movements: Extraocular movements intact.  Cardiovascular:     Rate and Rhythm: Normal rate.     Pulses: Normal pulses.  Pulmonary:     Effort: Pulmonary effort is normal.  Abdominal:     General: Abdomen is flat. There is no distension.  Musculoskeletal:        General: Tenderness present.     Cervical back: Normal range of motion.     Comments: Patient rises from seated position to standing without difficulty. Pain noted with facet loading and lumbar extension. 5/5 strength noted with bilateral hip flexion, knee flexion/extension, ankle dorsiflexion/plantarflexion and EHL. No clonus noted bilaterally. No pain upon palpation of greater trochanters. No pain with internal/external rotation of bilateral hips. Sensation intact bilaterally. Dysesthesias noted to bilateral L5  dermatomes. Negative slump test bilaterally. Ambulates without aid, gait steady.     Skin:    General: Skin is warm and dry.     Capillary Refill: Capillary refill takes less than 2 seconds.  Neurological:     General: No focal deficit present.     Mental Status: She is alert and oriented to person, place, and time.  Psychiatric:        Mood and Affect: Mood normal.        Behavior: Behavior normal.     Ortho Exam  Imaging: No results found.  Past Medical/Family/Surgical/Social History: Medications & Allergies reviewed per EMR, new medications updated. 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   Smoking 10/26/2021   Postoperative intra-abdominal abscess [facility])    Medication monitoring encounter    Pelvic infection in female 09/24/2021   Status post laparoscopic hysterectomy 09/11/2021   Cholelithiasis 01/13/2019   Pre-diabetes 11/15/2016   History of bilateral tubal ligation 09/28/2015   Tobacco abuse 09/28/2015   Past Medical History:  Diagnosis Date   Abnormal Pap smear of cervix    Anxiety    Depression    Dysmenorrhea    Eczema    History of COVID-19 10/24/2020   loss of taste and smellbody aches and cough x 4 days all symptoms resolved   History of kidney stones    Hypertension    Pre-diabetes    PTSD (post-traumatic stress disorder)    Rash 08/31/2021   comes and goes on legs saw dermetology no rash now ? food allergy   Sleep apnea    SUI (stress urinary incontinence, female)    Trichomonas infection 02/2021   Uterine fibroid    Family History  Problem Relation Age of Onset   Colon polyps Sister    Hypertension Maternal Grandmother    Diabetes Maternal Grandfather    Allergic rhinitis Daughter    Allergic rhinitis Daughter    Allergic rhinitis Son    Allergic rhinitis Son    Colon cancer Neg Hx    Esophageal cancer Neg Hx    Rectal cancer Neg Hx    Stomach cancer Neg Hx    Past Surgical History:  Procedure Laterality Date   BLADDER SUSPENSION N/A 09/11/2021   Procedure: TRANSVAGINAL TAPE (TVT) PROCEDURE;  Surgeon: Patton Salles, MD;  Location: [facility];  Service: Gynecology;  Laterality: N/A;   CESAREAN SECTION  1997   x 1   CHOLECYSTECTOMY N/A 01/14/2019   Procedure: LAPAROSCOPIC CHOLECYSTECTOMY WITH POSSIBLE  INTRAOPERATIVE CHOLANGIOGRAM;  Surgeon: Darnell Level, MD;  Location: [facility];   Service: General;  Laterality: N/A;   CYSTOSCOPY N/A 09/11/2021   Procedure: Bluford Kaufmann;  Surgeon: Patton Salles, MD;  Location: [facility];  Service: Gynecology;  Laterality: N/A;   CYSTOSCOPY/URETEROSCOPY/HOLMIUM LASER Left 01/31/2022   Procedure: LEFT URETEROSCOPY/HOLMIUM LASER;  Surgeon: Despina Arias, MD;  Location: [facility];  Service: Urology;  Laterality: Left;   DILITATION & CURRETTAGE/HYSTROSCOPY WITH HYDROTHERMAL ABLATION N/A 06/22/2016   Procedure: DILATATION & CURETTAGE/HYSTEROSCOPY WITH HYDROTHERMAL ABLATION;  Surgeon:  Brock Bad, MD;  Location: [facility];  Service: Gynecology;  Laterality: N/A;   TOTAL LAPAROSCOPIC HYSTERECTOMY WITH SALPINGECTOMY Bilateral 09/11/2021   Procedure: TOTAL LAPAROSCOPIC HYSTERECTOMY WITH SALPINGECTOMY;  Surgeon: Patton Salles, MD;  Location: [facility];  Service: Gynecology;  Laterality: Bilateral;   TUBAL LIGATION     09-28-2015   Social History   Occupational History   Not on file  Tobacco Use   Smoking status: Every Day    Current packs/day: 0.50    Average packs/day: 0.5 packs/day for 20.0 years (10.0 ttl pk-yrs)    Types: Cigarettes    Passive exposure: Never   Smokeless tobacco: Never  Vaping Use   Vaping status: Former  Substance and Sexual Activity   Alcohol use: Not Currently   Drug use: Never   Sexual activity: Not Currently    Partners: Male    Birth control/protection: Surgical    Comment: TL[facility] 09/11/21

## 2024-01-21 NOTE — Progress Notes (Unsigned)
 Pain Scale   Average Pain 8        +Driver, -BT, -Dye Allergies.

## 2024-01-22 NOTE — Progress Notes (Signed)
 GYNECOLOGY  VISIT   HPI: 48 y.o.   Legally Separated  African American female   [phone] with No LMP recorded (lmp unknown). Patient has had a hysterectomy.   here for: menopause symptoms consult; Patient complains of night sweats, hot flashes, and mood swings. No other symptoms currently.    Hot flashes are worse at night, has sweating.   Not sleeping well.   Some mood swings.   Has depression and anxiety.  Takes Lamictal and Trazadone.   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  through her PCP. Cost was a barrier.   Hx PE post op.   Has hx of a liver lesion.  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   She will go to pain management.   Has anxiety and depression.  Sees Everlena Cooper.   FSH 21.5, estradiol 97 on 04/16/22.   GYNECOLOGIC HISTORY: No LMP recorded (lmp unknown). Patient has had a hysterectomy. Contraception:  hyst Menopausal hormone therapy:  n/a Last 2 paps:  03/03/21 neg: HR HPV neg, 11/13/16 neg:HR HPV neg History of abnormal Pap or positive HPV:  yes, repeat normal Mammogram:  03/30/22 Breast Density cat C, BI-RADS CAT 2 benign        OB History     Gravida  4   Para  0   Term  0   Preterm  0   AB  0   Living  4      SAB  0   IAB  0   Ectopic  0   Multiple      Live Births  4              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   Smoking  10/26/2021   Postoperative intra-abdominal abscess [facility])    Medication monitoring encounter    Pelvic infection in female 09/24/2021   Status post laparoscopic hysterectomy 09/11/2021   Cholelithiasis 01/13/2019   Pre-diabetes 11/15/2016   History of bilateral tubal ligation 09/28/2015   Tobacco abuse 09/28/2015    Past Medical History:  Diagnosis Date   Abnormal Pap smear of cervix    Anxiety    Depression    Dysmenorrhea    Eczema    History of COVID-19 10/24/2020   loss of taste and smellbody aches and cough x 4 days all symptoms resolved   History of kidney stones    Hypertension    Pre-diabetes    PTSD (post-traumatic stress disorder)    Rash 08/31/2021   comes and goes on legs saw dermetology no rash now ? food allergy   Sleep apnea    SUI (stress urinary incontinence, female)    Trichomonas infection 02/2021   Uterine fibroid     Past Surgical History:  Procedure Laterality Date   BLADDER SUSPENSION N/A 09/11/2021   Procedure: TRANSVAGINAL TAPE (TVT) PROCEDURE;  Surgeon: Patton Salles, MD;  Location: [facility];  Service: Gynecology;  Laterality: N/A;   CESAREAN SECTION  1997   x 1   CHOLECYSTECTOMY N/A 01/14/2019   Procedure: LAPAROSCOPIC CHOLECYSTECTOMY WITH POSSIBLE  INTRAOPERATIVE CHOLANGIOGRAM;  Surgeon: Darnell Level, MD;  [facility];  Service: General;  Laterality: N/A;   CYSTOSCOPY N/A 09/11/2021   Procedure: Bluford Kaufmann;  Surgeon: Patton Salles, MD;  [facility];  Service: Gynecology;  Laterality: N/A;   CYSTOSCOPY/URETEROSCOPY/HOLMIUM LASER Left 01/31/2022   Procedure: LEFT URETEROSCOPY/HOLMIUM LASER;  Surgeon: Despina Arias, MD;  [facility];  Service: Urology;  Laterality: Left;   DILITATION & CURRETTAGE/HYSTROSCOPY WITH HYDROTHERMAL ABLATION N/A 06/22/2016   Procedure: DILATATION & CURETTAGE/HYSTEROSCOPY WITH HYDROTHERMAL ABLATION;  Surgeon: Brock Bad, MD;  [facility];  Service: Gynecology;  Laterality: N/A;   TOTAL LAPAROSCOPIC HYSTERECTOMY WITH SALPINGECTOMY Bilateral 09/11/2021   Procedure: TOTAL LAPAROSCOPIC HYSTERECTOMY WITH SALPINGECTOMY;  Surgeon: Patton Salles, MD;  Location: [facility];  Service: Gynecology;  Laterality: Bilateral;   TUBAL LIGATION     09-28-2015    Current Outpatient Medications  Medication Sig Dispense Refill   amLODipine (NORVASC) 10 MG tablet Take 1 tablet (10 mg total) by mouth every evening. 90 tablet 0   EPINEPHrine (EPIPEN 2-PAK) 0.3 mg/0.3 mL IJ SOAJ injection Inject 0.3 mg into the muscle as needed for anaphylaxis. 2 each 2   famotidine (PEPCID) 20 MG tablet 1 tablet at bedtime as needed Orally Once a day     gabapentin (NEURONTIN) 300 MG capsule Take 1 capsule (300 mg total) by mouth at bedtime. 90 capsule 0   hydrochlorothiazide (HYDRODIURIL) 25 MG tablet Take 1 tablet (25 mg total) by mouth daily. 90 tablet 0   lamoTRIgine (LAMICTAL) 100 MG tablet Take 1 tablet (100 mg total) by mouth daily. 90 tablet 0   metFORMIN (GLUCOPHAGE) 500 MG tablet Take 1 tablet (500 mg total) by mouth 2 (two) times daily with a meal. 180 tablet 0   traZODone (DESYREL) 100 MG tablet Take 1 tablet (100 mg total) by mouth at bedtime. Take an additional 50 mg as needed if not asleep in 30 minutes 135 tablet 0   XOLAIR 150 MG/ML prefilled syringe INJECT 300MG  SUBCUTANEOUSLY  EVERY 4 WEEKS 2 mL 11   Current Facility-Administered Medications  Medication Dose Route Frequency Provider Last Rate Last Admin   omalizumab Geoffry Paradise) prefilled syringe 300 mg  300 mg Subcutaneous Q28 days Nehemiah Settle, FNP   300 mg at 01/09/24 1501     ALLERGIES: Pork-derived products and Tramadol  Family History  Problem Relation Age of Onset   Colon polyps Sister    Hypertension Maternal Grandmother    Diabetes Maternal Grandfather    Allergic rhinitis Daughter    Allergic rhinitis Daughter    Allergic rhinitis Son    Allergic  rhinitis Son    Colon cancer Neg Hx    Esophageal cancer Neg Hx    Rectal cancer Neg Hx    Stomach cancer Neg Hx     Social History   Socioeconomic History   Marital status: Legally Separated    Spouse name: Not on file   Number of children: Not on file   Years of education: Not on file   Highest education level: 12th grade  Occupational History   Not on file  Tobacco Use   Smoking status: Every Day    Current packs/day: 0.50    Average packs/day: 0.5 packs/day for 20.0  years (10.0 ttl pk-yrs)    Types: Cigarettes    Passive exposure: Never   Smokeless tobacco: Never  Vaping Use   Vaping status: Former  Substance and Sexual Activity   Alcohol use: Not Currently   Drug use: Never   Sexual activity: Not Currently    Partners: Male    Birth control/protection: Surgical    Comment: TLH 09/11/21  Other Topics Concern   Not on file  Social History Narrative   ** Merged History Encounter **       Social Drivers of Health   Financial Resource Strain: Medium Risk (01/07/2024)   Overall Financial Resource Strain (CARDIA)    Difficulty of Paying Living Expenses: Somewhat hard  Food Insecurity: Food Insecurity Present (01/07/2024)   Hunger Vital Sign    Worried About Running Out of Food in the Last Year: Sometimes true    Ran Out of Food in the Last Year: Often true  Transportation Needs: Unmet Transportation Needs (01/07/2024)   PRAPARE - Transportation    Lack of Transportation (Medical): Yes    Lack of Transportation (Non-Medical): Yes  Physical Activity: Insufficiently Active (01/07/2024)   Exercise Vital Sign    Days of Exercise per Week: 3 days    Minutes of Exercise per Session: 20 min  Stress: No Stress Concern Present (01/07/2024)   [employer] of Occupational Health - Occupational Stress Questionnaire    Feeling of Stress : Only a little  Social Connections: Socially Isolated (01/07/2024)   Social Connection and Isolation Panel [NHANES]    Frequency of  Communication with Friends and Family: More than three times a week    Frequency of Social Gatherings with Friends and Family: Once a week    Attends Religious Services: Never    Database administrator or Organizations: No    Attends Engineer, structural: Not on file    Marital Status: Separated  Intimate Partner Violence: Not on file    Review of Systems  All other systems reviewed and are negative.   PHYSICAL EXAMINATION:   BP 118/84 (BP Location: Left Arm, Patient Position: Sitting, Cuff Size: Normal)   Pulse (!) 103   Ht 5' 2.25" (1.581 m)   Wt 197 lb (89.4 kg)   LMP  (LMP Unknown)   SpO2 92%   BMI 35.74 kg/m     General appearance: alert, cooperative and appears stated age   ASSESSMENT:  Status post laparoscopic hysterectomy with bilateral salpingectomy, TVT, cystoscopy. Post op pelvic abscess.  Post op PE.  Menopausal symptoms.  Not taking Gabapentin. Depression and anxiety.  Elevated A1C, 6.4 on 07/08/23.  Hepatic lesion. Chronic back pain.   PLAN:  We discussed menopausal symptoms and treatment options for her:  Neurontin, Paxil, Effexor. She will consider these options. Good Rx discussed.  She will contact her provider about switching tx options to control her anxiety, depression, and menopausal symptoms.  Not a candidate for ERT.  Check FSH, estradiol.  Follow up for annual exam in July.   27 min  total time was spent for this patient encounter, including preparation, face-to-face counseling with the patient, coordination of care, and documentation of the encounter.
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
## 2024-01-30 ENCOUNTER — Encounter (HOSPITAL_COMMUNITY): Payer: Self-pay | Admitting: Psychiatry

## 2024-01-30 ENCOUNTER — Telehealth (HOSPITAL_BASED_OUTPATIENT_CLINIC_OR_DEPARTMENT_OTHER): Admitting: Psychiatry

## 2024-01-30 DIAGNOSIS — F321 Major depressive disorder, single episode, moderate: Secondary | ICD-10-CM

## 2024-01-30 DIAGNOSIS — F4310 Post-traumatic stress disorder, unspecified: Secondary | ICD-10-CM | POA: Diagnosis not present

## 2024-01-30 DIAGNOSIS — F331 Major depressive disorder, recurrent, moderate: Secondary | ICD-10-CM

## 2024-01-30 MED ORDER — TRAZODONE HCL 100 MG PO TABS: 100.0000 mg | ORAL_TABLET | Freq: Every day | ORAL | 0 refills | Status: DC

## 2024-01-30 MED ORDER — LAMOTRIGINE 100 MG PO TABS: 100.0000 mg | ORAL_TABLET | Freq: Every day | ORAL | 0 refills | Status: DC

## 2024-02-05 ENCOUNTER — Encounter: Payer: Self-pay | Admitting: Obstetrics and Gynecology

## 2024-02-05 ENCOUNTER — Ambulatory Visit (INDEPENDENT_AMBULATORY_CARE_PROVIDER_SITE_OTHER): Admitting: Obstetrics and Gynecology

## 2024-02-05 VITALS — BP 118/84 | HR 103 | Ht 62.25 in | Wt 197.0 lb

## 2024-02-05 DIAGNOSIS — K8020 Calculus of gallbladder without cholecystitis without obstruction: Secondary | ICD-10-CM | POA: Diagnosis not present

## 2024-02-05 DIAGNOSIS — N951 Menopausal and female climacteric states: Secondary | ICD-10-CM | POA: Diagnosis not present

## 2024-02-05 DIAGNOSIS — R7303 Prediabetes: Secondary | ICD-10-CM | POA: Diagnosis not present

## 2024-02-05 DIAGNOSIS — I10 Essential (primary) hypertension: Secondary | ICD-10-CM | POA: Diagnosis not present

## 2024-02-05 NOTE — Patient Instructions (Signed)
 Venlafaxine Tablets What is this medication? VENLAFAXINE (VEN la fax een) treats depression and anxiety. It increases the amount of serotonin and norepinephrine in the brain, hormones that help regulate mood. It belongs to a group of medications called SNRIs. This medicine may be used for other purposes; ask your health care provider or pharmacist if you have questions. COMMON BRAND NAME(S): Effexor What should I tell my care team before I take this medication? They need to know if you have any of these conditions: Bleeding disorders Glaucoma Heart disease High blood pressure High cholesterol Kidney disease Liver disease Low levels of sodium in the blood Mania or bipolar disorder Seizures Suicidal thoughts, plans, or attempt by you or a family member Take medications that treat or prevent blood clots Thyroid disease An unusual or allergic reaction to venlafaxine, other medications, foods, dyes, or preservatives Pregnant or trying to get pregnant Breastfeeding How should I use this medication? Take this medication by mouth with a glass of water. Take it as directed on the prescription label. Take it with food. Take your medication at regular intervals. Do not take your medication more often than directed. Keep taking this medication unless your care team tells you to stop. Stopping it too quickly can cause serious side effects. It can also make your condition worse. A special MedGuide will be given to you by the pharmacist with each prescription and refill. Be sure to read this information carefully each time. Talk to your care team about the use of this medication in children. Special care may be needed. Overdosage: If you think you have taken too much of this medicine contact a poison control center or emergency room at once. NOTE: This medicine is only for you. Do not share this medicine with others. What if I miss a dose? If you miss a dose, take it as soon as you can. If it is  almost time for your next dose, take only that dose. Do not take double or extra doses. What may interact with this medication? Do not take this medication with any of the following: Certain medications for fungal infections, such as fluconazole, itraconazole, ketoconazole, posaconazole, voriconazole Cisapride Desvenlafaxine Dronedarone Duloxetine Levomilnacipran Linezolid MAOIs, such as Carbex, Eldepryl, Marplan, Nardil, and Parnate Methylene blue (injected into a vein) Milnacipran Pimozide Thioridazine This medication may also interact with the following: Amphetamines Aspirin and aspirin-like medications Certain medications for mental health conditions Certain medications for migraine headaches, such as almotriptan, eletriptan, frovatriptan, naratriptan, rizatriptan, sumatriptan, zolmitriptan Certain medications for sleep Certain medications that treat or prevent blood clots, such as dalteparin, enoxaparin, warfarin Cimetidine Clozapine Diuretics Fentanyl Furazolidone Indinavir Isoniazid Lithium Metoprolol NSAIDS, medications for pain and inflammation, such as ibuprofen or naproxen Other medications that cause heart rhythm changes Procarbazine Rasagiline Supplements, such as St. John's Wort, kava kava, valerian Tramadol Tryptophan This list may not describe all possible interactions. Give your health care provider a list of all the medicines, herbs, non-prescription drugs, or dietary supplements you use. Also tell them if you smoke, drink alcohol, or use illegal drugs. Some items may interact with your medicine. What should I watch for while using this medication? Tell your care team if your symptoms do not get better or if they get worse. Visit your care team for regular checks on your progress. Because it may take several weeks to see the full effects of this medication, it is important to continue your treatment as prescribed by your care team. Watch for new or worsening  thoughts of  suicide or depression. This includes sudden changes in mood, behaviors, or thoughts. These changes can happen at any time but are more common in the beginning of treatment or after a change in dose. Call your care team right away if you experience these thoughts or worsening depression. This medication may cause mood and behavior changes, such as anxiety, nervousness, irritability, hostility, restlessness, excitability, hyperactivity, or trouble sleeping. These changes can happen at any time but are more common in the beginning of treatment or after a change in dose. Call your care team right away if you notice any of these symptoms. This medication can cause an increase in blood pressure. Check with your care team for instructions on monitoring your blood pressure while taking this medication. This medication may affect your coordination, reaction time, or judgment. Do not drive or operate machinery until you know how this medication affects you. Sit up or stand slowly to reduce the risk of dizzy or fainting spells. Drinking alcohol with this medication can increase the risk of these side effects. Your mouth may get dry. Chewing sugarless gum or sucking hard candy and drinking plenty of water may help. Contact your care team if the problem does not go away or is severe. What side effects may I notice from receiving this medication? Side effects that you should report to your care team as soon as possible: Allergic reactions--skin rash, itching, hives, swelling of the face, lips, tongue, or throat Bleeding--bloody or black, tar-like stools, red or dark brown urine, vomiting blood or brown material that looks like coffee grounds, small, red or purple spots on skin, unusual bleeding or bruising Heart rhythm changes--fast or irregular heartbeat, dizziness, feeling faint or lightheaded, chest pain, trouble breathing Increase in blood pressure Loss of appetite with weight loss Low sodium  level--muscle weakness, fatigue, dizziness, headache, confusion Serotonin syndrome--irritability, confusion, fast or irregular heartbeat, muscle stiffness, twitching muscles, sweating, high fever, seizures, chills, vomiting, diarrhea Sudden eye pain or change in vision such as blurry vision, seeing halos around lights, vision loss Thoughts of suicide or self-harm, worsening mood, feelings of depression Side effects that usually do not require medical attention (report to your care team if they continue or are bothersome): Anxiety, nervousness Change in sex drive or performance Dizziness Dry mouth Excessive sweating Nausea Tremors or shaking Trouble sleeping This list may not describe all possible side effects. Call your doctor for medical advice about side effects. You may report side effects to FDA at 1-800-FDA-1088. Where should I keep my medication? Keep out of the reach of children and pets. Store at a controlled temperature between 20 and 25 degrees C (68 and 77 degrees F), in a dry place. Throw away any unused medication after the expiration date. NOTE: This sheet is a summary. It may not cover all possible information. If you have questions about this medicine, talk to your doctor, pharmacist, or health care provider.  2024 [employer] Standard (2022-10-11 00:00:00)  Paroxetine Tablets What is this medication? PAROXETINE (pa ROX e teen) treats depression, anxiety, obsessive-compulsive disorder (OCD), post-traumatic stress disorder (PTSD), and premenstrual dysphoric disorder (PMDD). It increases the amount of serotonin in the brain, a hormone that helps regulate mood. It belongs to a group of medications called SSRIs. This medicine may be used for other purposes; ask your health care provider or pharmacist if you have questions. COMMON BRAND NAME(S): Paxil, Pexeva What should I tell my care team before I take this medication? They need to know if you have any of  these  conditions: Bipolar disorder or a family history of bipolar disorder Bleeding disorder Glaucoma Heart disease Kidney disease Liver disease Low levels of sodium in the blood Seizures Suicidal thoughts, plans, or attempt by you or a family member Take MAOIs, such as Carbex, Eldepryl, Marplan, Nardil, and Parnate Take medications that treat or prevent blood clots Thyroid disease An unusual or allergic reaction to paroxetine, other medications, foods, dyes, or preservatives Pregnant or trying to get pregnant Breastfeeding How should I use this medication? Take this medication by mouth with water. Take it as directed on the prescription label at the same time every day. You can take it with or without food. If it upsets your stomach, take it with food. Do not take your medication more often than directed. Keep taking this medication unless your care team tells you to stop. Stopping it too quickly can cause serious side effects. It can also make your condition worse. A special MedGuide will be given to you by the pharmacist with each prescription and refill. Be sure to read this information carefully each time. Talk to your care team about the use of this medication in children. Special care may be needed. Overdosage: If you think you have taken too much of this medicine contact a poison control center or emergency room at once. NOTE: This medicine is only for you. Do not share this medicine with others. What if I miss a dose? If you miss a dose, take it as soon as you can. If it is almost time for your next dose, take only that dose. Do not take double or extra doses. What may interact with this medication? Do not take this medication with any of the following: Linezolid MAOIs, such as Carbex, Eldepryl, Marplan, Nardil, and Parnate Methylene blue (injected into a vein) Pimozide Thioridazine This medication may also interact with the following: Alcohol Amphetamines Aspirin and  aspirin-like medications Atomoxetine Certain medications for irregular heart beat, such as propafenone, flecainide, encainide, and quinidine Certain medications for mental health conditions Certain medications for migraine headache, such as almotriptan, eletriptan, frovatriptan, naratriptan, rizatriptan, sumatriptan, zolmitriptan Cimetidine Digoxin Diuretics Fentanyl Fosamprenavir Furazolidone Isoniazid Lithium Medications that treat or prevent blood clots, such as warfarin, enoxaparin, and dalteparin Medications for sleep NSAIDs, medications for pain and inflammation, such as ibuprofen or naproxen Phenobarbital Phenytoin Procarbazine Rasagiline Ritonavir Supplements, such as St. John's wort, kava kava, valerian Tamoxifen Tramadol Tryptophan This list may not describe all possible interactions. Give your health care provider a list of all the medicines, herbs, non-prescription drugs, or dietary supplements you use. Also tell them if you smoke, drink alcohol, or use illegal drugs. Some items may interact with your medicine. What should I watch for while using this medication? Tell your care team if your symptoms do not get better or if they get worse. Visit your care team for regular checks on your progress. Because it may take several weeks to see the full effects of this medication, it is important to continue your treatment as prescribed by your care team. Watch for new or worsening thoughts of suicide or depression. This includes sudden changes in mood, behaviors, or thoughts. These changes can happen at any time but are more common in the beginning of treatment or after a change in dose. Call your care team right away if you experience these thoughts or worsening depression. This medication may cause mood and behavior changes, such as anxiety, nervousness, irritability, hostility, restlessness, excitability, hyperactivity, or trouble sleeping. These changes can happen  at any time but  are more common in the beginning of treatment or after a change in dose. Call your care team right away if you notice any of these symptoms. This medication may affect your coordination, reaction time, or judgment. Do not drive or operate machinery until you know how this medication affects you. Sit up or stand slowly to reduce the risk of dizzy or fainting spells. Drinking alcohol with this medication can increase the risk of these side effects. Your mouth may get dry. Chewing sugarless gum or sucking hard candy and drinking plenty of water may help. Contact your care team if the problem does not go away or is severe. What side effects may I notice from receiving this medication? Side effects that you should report to your care team as soon as possible: Allergic reactions--skin rash, itching, hives, swelling of the face, lips, tongue, or throat Bleeding--bloody or black, tar-like stools, red or dark brown urine, vomiting blood or brown material that looks like coffee grounds, small, red or purple spots on skin, unusual bleeding or bruising Heart rhythm changes--fast or irregular heartbeat, dizziness, feeling faint or lightheaded, chest pain, trouble breathing Low sodium level--muscle weakness, fatigue, dizziness, headache, confusion Serotonin syndrome--irritability, confusion, fast or irregular heartbeat, muscle stiffness, twitching muscles, sweating, high fever, seizures, chills, vomiting, diarrhea Sudden eye pain or change in vision such as blurry vision, seeing halos around lights, vision loss Thoughts of suicide or self-harm, worsening mood, feelings of depression Side effects that usually do not require medical attention (report to your care team if they continue or are bothersome): Change in sex drive or performance Diarrhea Excessive sweating Nausea Tremors or shaking Upset stomach This list may not describe all possible side effects. Call your doctor for medical advice about side effects.  You may report side effects to FDA at 1-800-FDA-1088. Where should I keep my medication? Keep out of the reach of children and pets. Store at room temperature between 15 and 30 degrees C (59 and 86 degrees F). Keep container tightly closed. Throw away any unused medication after the expiration date. NOTE: This sheet is a summary. It may not cover all possible information. If you have questions about this medicine, talk to your doctor, pharmacist, or health care provider.  2024 [employer]/Gold Standard (2022-08-20 00:00:00)

## 2024-02-06 ENCOUNTER — Encounter: Payer: Self-pay | Admitting: Obstetrics and Gynecology

## 2024-02-06 DIAGNOSIS — D539 Nutritional anemia, unspecified: Secondary | ICD-10-CM | POA: Diagnosis not present

## 2024-02-06 DIAGNOSIS — R0602 Shortness of breath: Secondary | ICD-10-CM | POA: Diagnosis not present

## 2024-02-06 DIAGNOSIS — E6609 Other obesity due to excess calories: Secondary | ICD-10-CM | POA: Diagnosis not present

## 2024-02-06 DIAGNOSIS — M51361 Other intervertebral disc degeneration, lumbar region with lower extremity pain only: Secondary | ICD-10-CM | POA: Diagnosis not present

## 2024-02-06 DIAGNOSIS — M129 Arthropathy, unspecified: Secondary | ICD-10-CM | POA: Diagnosis not present

## 2024-02-06 DIAGNOSIS — R5383 Other fatigue: Secondary | ICD-10-CM | POA: Diagnosis not present

## 2024-02-06 DIAGNOSIS — F17210 Nicotine dependence, cigarettes, uncomplicated: Secondary | ICD-10-CM | POA: Diagnosis not present

## 2024-02-06 DIAGNOSIS — E7800 Pure hypercholesterolemia, unspecified: Secondary | ICD-10-CM | POA: Diagnosis not present

## 2024-02-06 DIAGNOSIS — Z1159 Encounter for screening for other viral diseases: Secondary | ICD-10-CM | POA: Diagnosis not present

## 2024-02-06 DIAGNOSIS — Z79899 Other long term (current) drug therapy: Secondary | ICD-10-CM | POA: Diagnosis not present

## 2024-02-06 DIAGNOSIS — E559 Vitamin D deficiency, unspecified: Secondary | ICD-10-CM | POA: Diagnosis not present

## 2024-02-06 LAB — FOLLICLE STIMULATING HORMONE: FSH: 19.8 m[IU]/mL

## 2024-02-06 LAB — ESTRADIOL: Estradiol: 176 pg/mL

## 2024-02-12 DIAGNOSIS — Z79899 Other long term (current) drug therapy: Secondary | ICD-10-CM | POA: Diagnosis not present

## 2024-02-13 ENCOUNTER — Ambulatory Visit

## 2024-02-13 DIAGNOSIS — E7800 Pure hypercholesterolemia, unspecified: Secondary | ICD-10-CM | POA: Diagnosis not present

## 2024-02-13 DIAGNOSIS — R5383 Other fatigue: Secondary | ICD-10-CM | POA: Diagnosis not present

## 2024-02-13 DIAGNOSIS — E559 Vitamin D deficiency, unspecified: Secondary | ICD-10-CM | POA: Diagnosis not present

## 2024-02-13 DIAGNOSIS — Z131 Encounter for screening for diabetes mellitus: Secondary | ICD-10-CM | POA: Diagnosis not present

## 2024-02-13 DIAGNOSIS — D539 Nutritional anemia, unspecified: Secondary | ICD-10-CM | POA: Diagnosis not present

## 2024-02-13 DIAGNOSIS — Z79899 Other long term (current) drug therapy: Secondary | ICD-10-CM | POA: Diagnosis not present

## 2024-02-13 DIAGNOSIS — Z1159 Encounter for screening for other viral diseases: Secondary | ICD-10-CM | POA: Diagnosis not present

## 2024-02-13 DIAGNOSIS — L501 Idiopathic urticaria: Secondary | ICD-10-CM | POA: Diagnosis not present

## 2024-02-13 DIAGNOSIS — M129 Arthropathy, unspecified: Secondary | ICD-10-CM | POA: Diagnosis not present

## 2024-02-28 DIAGNOSIS — F1220 Cannabis dependence, uncomplicated: Secondary | ICD-10-CM | POA: Diagnosis not present

## 2024-02-28 DIAGNOSIS — M51361 Other intervertebral disc degeneration, lumbar region with lower extremity pain only: Secondary | ICD-10-CM | POA: Diagnosis not present

## 2024-02-28 DIAGNOSIS — Z79899 Other long term (current) drug therapy: Secondary | ICD-10-CM | POA: Diagnosis not present

## 2024-02-28 DIAGNOSIS — E6609 Other obesity due to excess calories: Secondary | ICD-10-CM | POA: Diagnosis not present

## 2024-02-28 DIAGNOSIS — F17210 Nicotine dependence, cigarettes, uncomplicated: Secondary | ICD-10-CM | POA: Diagnosis not present
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
## 2024-03-10 ENCOUNTER — Telehealth (HOSPITAL_BASED_OUTPATIENT_CLINIC_OR_DEPARTMENT_OTHER): Admitting: Psychiatry

## 2024-03-10 ENCOUNTER — Encounter (HOSPITAL_COMMUNITY): Payer: Self-pay | Admitting: Psychiatry

## 2024-03-10 DIAGNOSIS — F331 Major depressive disorder, recurrent, moderate: Secondary | ICD-10-CM | POA: Diagnosis not present

## 2024-03-10 DIAGNOSIS — F4310 Post-traumatic stress disorder, unspecified: Secondary | ICD-10-CM

## 2024-03-10 MED ORDER — DULOXETINE HCL 30 MG PO CPEP: 30.0000 mg | ORAL_CAPSULE | Freq: Every day | ORAL | 2 refills | Status: DC

## 2024-03-19 ENCOUNTER — Ambulatory Visit

## 2024-03-25 ENCOUNTER — Ambulatory Visit (INDEPENDENT_AMBULATORY_CARE_PROVIDER_SITE_OTHER)

## 2024-03-25 DIAGNOSIS — L501 Idiopathic urticaria: Secondary | ICD-10-CM | POA: Diagnosis not present

## 2024-03-30 DIAGNOSIS — E6609 Other obesity due to excess calories: Secondary | ICD-10-CM | POA: Diagnosis not present

## 2024-03-30 DIAGNOSIS — M51361 Other intervertebral disc degeneration, lumbar region with lower extremity pain only: Secondary | ICD-10-CM | POA: Diagnosis not present

## 2024-03-30 DIAGNOSIS — E119 Type 2 diabetes mellitus without complications: Secondary | ICD-10-CM | POA: Diagnosis not present

## 2024-03-30 DIAGNOSIS — E7800 Pure hypercholesterolemia, unspecified: Secondary | ICD-10-CM | POA: Diagnosis not present

## 2024-03-30 DIAGNOSIS — E559 Vitamin D deficiency, unspecified: Secondary | ICD-10-CM | POA: Diagnosis not present

## 2024-03-30 DIAGNOSIS — F17210 Nicotine dependence, cigarettes, uncomplicated: Secondary | ICD-10-CM | POA: Diagnosis not present

## 2024-03-30 DIAGNOSIS — Z79899 Other long term (current) drug therapy: Secondary | ICD-10-CM | POA: Diagnosis not present

## 2024-03-30 DIAGNOSIS — F1220 Cannabis dependence, uncomplicated: Secondary | ICD-10-CM | POA: Diagnosis not present

## 2024-04-12 ENCOUNTER — Other Ambulatory Visit: Payer: Self-pay | Admitting: Family

## 2024-04-13 NOTE — Telephone Encounter (Signed)
 Complete
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
## 2024-04-16 ENCOUNTER — Encounter (HOSPITAL_COMMUNITY): Payer: Self-pay | Admitting: Psychiatry

## 2024-04-16 ENCOUNTER — Telehealth (HOSPITAL_COMMUNITY): Admitting: Psychiatry

## 2024-04-16 DIAGNOSIS — F4310 Post-traumatic stress disorder, unspecified: Secondary | ICD-10-CM

## 2024-04-16 DIAGNOSIS — F321 Major depressive disorder, single episode, moderate: Secondary | ICD-10-CM

## 2024-04-16 DIAGNOSIS — F331 Major depressive disorder, recurrent, moderate: Secondary | ICD-10-CM

## 2024-04-16 MED ORDER — LAMOTRIGINE 100 MG PO TABS: 100.0000 mg | ORAL_TABLET | Freq: Every day | ORAL | 0 refills | Status: DC

## 2024-04-25 DIAGNOSIS — F17210 Nicotine dependence, cigarettes, uncomplicated: Secondary | ICD-10-CM | POA: Diagnosis not present

## 2024-04-25 DIAGNOSIS — E6609 Other obesity due to excess calories: Secondary | ICD-10-CM | POA: Diagnosis not present

## 2024-04-25 DIAGNOSIS — M51361 Other intervertebral disc degeneration, lumbar region with lower extremity pain only: Secondary | ICD-10-CM | POA: Diagnosis not present

## 2024-04-25 DIAGNOSIS — E119 Type 2 diabetes mellitus without complications: Secondary | ICD-10-CM | POA: Diagnosis not present

## 2024-04-25 DIAGNOSIS — E559 Vitamin D deficiency, unspecified: Secondary | ICD-10-CM | POA: Diagnosis not present

## 2024-04-25 DIAGNOSIS — Z79899 Other long term (current) drug therapy: Secondary | ICD-10-CM | POA: Diagnosis not present

## 2024-04-25 DIAGNOSIS — F1220 Cannabis dependence, uncomplicated: Secondary | ICD-10-CM | POA: Diagnosis not present

## 2024-04-28 DIAGNOSIS — Z79899 Other long term (current) drug therapy: Secondary | ICD-10-CM | POA: Diagnosis not present

## 2024-04-29 ENCOUNTER — Ambulatory Visit (INDEPENDENT_AMBULATORY_CARE_PROVIDER_SITE_OTHER)

## 2024-04-29 DIAGNOSIS — F17210 Nicotine dependence, cigarettes, uncomplicated: Secondary | ICD-10-CM | POA: Diagnosis not present

## 2024-04-29 DIAGNOSIS — E119 Type 2 diabetes mellitus without complications: Secondary | ICD-10-CM | POA: Diagnosis not present

## 2024-04-29 DIAGNOSIS — E7800 Pure hypercholesterolemia, unspecified: Secondary | ICD-10-CM | POA: Diagnosis not present

## 2024-04-29 DIAGNOSIS — Z79899 Other long term (current) drug therapy: Secondary | ICD-10-CM | POA: Diagnosis not present

## 2024-04-29 DIAGNOSIS — L501 Idiopathic urticaria: Secondary | ICD-10-CM | POA: Diagnosis not present

## 2024-04-29 DIAGNOSIS — M51361 Other intervertebral disc degeneration, lumbar region with lower extremity pain only: Secondary | ICD-10-CM | POA: Diagnosis not present

## 2024-04-29 DIAGNOSIS — E559 Vitamin D deficiency, unspecified: Secondary | ICD-10-CM | POA: Diagnosis not present

## 2024-04-29 DIAGNOSIS — E6609 Other obesity due to excess calories: Secondary | ICD-10-CM | POA: Diagnosis not present

## 2024-05-05 ENCOUNTER — Other Ambulatory Visit (HOSPITAL_COMMUNITY): Payer: Self-pay | Admitting: Psychiatry

## 2024-05-05 DIAGNOSIS — F4310 Post-traumatic stress disorder, unspecified: Secondary | ICD-10-CM

## 2024-05-05 DIAGNOSIS — Z79899 Other long term (current) drug therapy: Secondary | ICD-10-CM | POA: Diagnosis not present

## 2024-05-05 DIAGNOSIS — F331 Major depressive disorder, recurrent, moderate: Secondary | ICD-10-CM

## 2024-05-05 NOTE — Progress Notes (Unsigned)
 Follow Up Note  RE: Brianna Gutierrez MRN: [id number] DOB: 23-Oct-1976 Date of Office Visit: 05/06/2024  Referring provider: Lorren Greig PARAS, NP Primary care provider: Lorren Greig PARAS, NP  Chief Complaint: No chief complaint on file.  History of Present Illness: I had the pleasure of seeing Brianna Gutierrez for a follow up visit at the [facility] on 05/06/2024. She is a 48 y.o. 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 Her previous allergy office visit was on 11/06/2023 with Dr. Luke. Today is a regular follow up visit.  Discussed the use of AI scribe software for clinical note transcription with the patient, who gave verbal consent to proceed.  History of Present Illness            ***  Assessment and Plan: Jode is a 48 y.o. female with: Neck rash New onset, burning sensation, no known triggers. Unclear etiology. Use desonide  0.05% ointment twice a day as needed for mild rash flares - okay to use on the face, neck, groin area. Do not use more than 1 week at a time. Take pictures of the rash.   Chronic idiopathic urticaria Past history - Pruritic rash for 1 year lasting 1 day at a time. No triggers noted. Occurs a few times per month. Concerned whether strawberries, bananas and seafood may be contributing to the symptoms. Saw dermatology virtually in the past with no benefit.  Interim history - controlled on Xolair  every 5 weeks. Continue proper skin care. Keep track of flares. During hive flare start the following medications until symptoms subside. Allegra  180mg  1-2 times per day. Famotidine  20mg  twice a day Avoid the following potential triggers: alcohol, tight clothing, NSAIDs, hot showers and getting overheated. Xolair  300mg  every 5 weeks.    Dyspnea on exertion Past history - Dyspnea on exertion and uses albuterol  as needed with good benefit. Has been using it daily since  prescribed 1 month ago. 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 CT chest in April 2023 was unremarkable. 2023 spirometry showed restriction with 2% improvement FEV1 postbronchodilator treatment. Clinically feeling improved  May use albuterol  rescue inhaler 2 puffs every 4 to 6 hours as needed for shortness of breath, chest tightness, coughing, and wheezing. May use albuterol  rescue inhaler 2 puffs 5 to 15 minutes prior to strenuous physical activities. Monitor frequency of use - if you need to use it more than twice per week on a consistent basis let us  know.    Other adverse food reactions, not elsewhere classified, subsequent encounter Past history - 2024 bloodwork positive to pork and lamb. Tolerates bananas and strawberries.  Continue to avoid pork, lamb and beef jerky. Monitor symptoms after eating beef.    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  Continue environmental control measures.    GERD Continue famotidine  20mg  once a day   Assessment and Plan              No follow-ups on file.  No orders of the defined types were placed in this encounter.  Lab Orders  No laboratory test(s) ordered today    Diagnostics: Spirometry:  Tracings reviewed. Her effort: {Blank single:19197::Good reproducible efforts.,It was hard to get consistent efforts and there is a question as to whether this reflects a maximal maneuver.,Poor effort, data can not  be interpreted.} FVC: ***L FEV1: ***L, ***% predicted FEV1/FVC ratio: ***% Interpretation: {Blank single:19197::Spirometry consistent with mild obstructive disease,Spirometry consistent with moderate obstructive disease,Spirometry consistent with severe obstructive disease,Spirometry consistent with possible restrictive disease,Spirometry consistent with mixed obstructive and restrictive  disease,Spirometry uninterpretable due to technique,Spirometry consistent with normal pattern,No overt abnormalities noted given today's efforts}.  Please see scanned spirometry results for details.  Skin Testing: {Blank single:19197::Select foods,Environmental allergy panel,Environmental allergy panel and select foods,Food allergy panel,None,Deferred due to recent antihistamines use}. *** Results discussed with patient/family.   Medication List:  Current Outpatient Medications  Medication Sig Dispense Refill  . amLODipine  (NORVASC ) 10 MG tablet Take 1 tablet (10 mg total) by mouth every evening. 90 tablet 0  . DULoxetine  (CYMBALTA ) 30 MG capsule Take 1 capsule (30 mg total) by mouth daily. 30 capsule 2  . EPINEPHrine  0.3 mg/0.3 mL IJ SOAJ injection INJECT CONTENTS OF 1 PEN AS NEEDED FOR ALLERGIC REACTION 2 each 2  . famotidine  (PEPCID ) 20 MG tablet 1 tablet at bedtime as needed Orally Once a day    . gabapentin  (NEURONTIN ) 300 MG capsule Take 1 capsule (300 mg total) by mouth at bedtime. 90 capsule 0  . hydrochlorothiazide  (HYDRODIURIL ) 25 MG tablet Take 1 tablet (25 mg total) by mouth daily. 90 tablet 0  . lamoTRIgine  (LAMICTAL ) 100 MG tablet Take 1 tablet (100 mg total) by mouth daily. 90 tablet 0  . metFORMIN  (GLUCOPHAGE ) 500 MG tablet Take 1 tablet (500 mg total) by mouth 2 (two) times daily with a meal. 180 tablet 0  . XOLAIR  150 MG/ML prefilled syringe INJECT 300MG  SUBCUTANEOUSLY  EVERY 4 WEEKS 2 mL 11   Current Facility-Administered Medications  Medication Dose Route Frequency Provider Last Rate Last Admin  . omalizumab  (XOLAIR ) prefilled syringe 300 mg  300 mg Subcutaneous Q28 days Cheryl Reusing, FNP   300 mg at 04/29/24 1436   Allergies: Allergies  Allergen Reactions  . Pork-Derived Products   . Tramadol      rash   I reviewed her past medical history, social history, family history, and environmental history and no significant changes have been reported  from her previous visit.  Review of Systems  Constitutional:  Negative for appetite change, chills, fever and unexpected weight change.  HENT:  Positive for congestion. Negative for rhinorrhea.   Eyes:  Negative for itching.  Respiratory:  Positive for cough. Negative for chest tightness, shortness of breath and wheezing.   Cardiovascular:  Negative for chest pain.  Gastrointestinal:  Negative for abdominal pain.  Genitourinary:  Negative for difficulty urinating.  Skin:  Positive for rash.  Allergic/Immunologic: Positive for environmental allergies.  Neurological:  Negative for headaches.    Objective: LMP  (LMP Unknown)  There is no height or weight on file to calculate BMI. Physical Exam Vitals and nursing note reviewed.  Constitutional:      Appearance: Normal appearance. She is well-developed. She is obese.  HENT:     Head: Normocephalic and atraumatic.     Right Ear: Tympanic membrane and external ear normal.     Left Ear: Tympanic membrane and external ear normal.     Nose: Nose normal.     Mouth/Throat:     Mouth: Mucous membranes are moist.     Comments: Cobblestoning noted. Eyes:     Conjunctiva/sclera: Conjunctivae normal.  Cardiovascular:     Rate and Rhythm: Normal rate and regular rhythm.     Heart sounds: Normal heart sounds. No murmur heard.    No friction  rub. No gallop.  Pulmonary:     Effort: Pulmonary effort is normal.     Breath sounds: Normal breath sounds. No wheezing, rhonchi or rales.  Musculoskeletal:     Cervical back: Neck supple.  Skin:    General: Skin is warm.     Comments: Peeling skin on the neck.  Neurological:     Mental Status: She is alert and oriented to person, place, and time.  Psychiatric:        Behavior: Behavior normal.   Previous notes and tests were reviewed. The plan was reviewed with the patient/family, and all questions/concerned were addressed.  It was my pleasure to see Jennea today and participate in her care.  Please feel free to contact me with any questions or concerns.  Sincerely,  Orlan Cramp, DO Allergy & Immunology  [facility]  [place] office: [phone] [facility] office: [phone]

## 2024-05-06 ENCOUNTER — Encounter: Payer: Self-pay | Admitting: Family

## 2024-05-06 ENCOUNTER — Ambulatory Visit (INDEPENDENT_AMBULATORY_CARE_PROVIDER_SITE_OTHER): Payer: BC Managed Care – PPO | Admitting: Allergy

## 2024-05-06 ENCOUNTER — Encounter: Admitting: Family

## 2024-05-06 ENCOUNTER — Encounter: Payer: Self-pay | Admitting: Allergy

## 2024-05-06 VITALS — BP 130/80 | HR 94 | Temp 98.0°F | Resp 18 | Ht 62.0 in | Wt 197.8 lb

## 2024-05-06 DIAGNOSIS — T781XXD Other adverse food reactions, not elsewhere classified, subsequent encounter: Secondary | ICD-10-CM | POA: Diagnosis not present

## 2024-05-06 DIAGNOSIS — J3089 Other allergic rhinitis: Secondary | ICD-10-CM

## 2024-05-06 DIAGNOSIS — K219 Gastro-esophageal reflux disease without esophagitis: Secondary | ICD-10-CM

## 2024-05-06 DIAGNOSIS — R0609 Other forms of dyspnea: Secondary | ICD-10-CM | POA: Diagnosis not present

## 2024-05-06 DIAGNOSIS — J3081 Allergic rhinitis due to animal (cat) (dog) hair and dander: Secondary | ICD-10-CM | POA: Diagnosis not present

## 2024-05-06 DIAGNOSIS — L501 Idiopathic urticaria: Secondary | ICD-10-CM | POA: Diagnosis not present

## 2024-05-06 MED ORDER — BUDESONIDE-FORMOTEROL FUMARATE 80-4.5 MCG/ACT IN AERO
2.0000 | INHALATION_SPRAY | Freq: Every day | RESPIRATORY_TRACT | 3 refills | Status: DC
Start: 2024-05-06 — End: 2024-09-16

## 2024-05-06 MED ORDER — ALBUTEROL SULFATE HFA 108 (90 BASE) MCG/ACT IN AERS: 2.0000 | INHALATION_SPRAY | RESPIRATORY_TRACT | 1 refills | Status: AC | PRN

## 2024-05-06 NOTE — Patient Instructions (Addendum)
 Breathing Daily controller medication(s): start Symbicort  80mcg 2 puffs once a day with spacer and rinse mouth afterwards. May use albuterol  rescue inhaler 2 puffs or nebulizer every 4 to 6 hours as needed for shortness of breath, chest tightness, coughing, and wheezing. May use albuterol  rescue inhaler 2 puffs 5 to 15 minutes prior to strenuous physical activities. Monitor frequency of use - if you need to use it more than twice per week on a consistent basis let us  know.  Breathing control goals:  Full participation in all desired activities (may need albuterol  before activity) Albuterol  use two times or less a week on average (not counting use with activity) Cough interfering with sleep two times or less a month Oral steroids no more than once a year No hospitalizations    Do NOT use the Epipen  only for symptoms for itching.   For mild symptoms you can take over the counter antihistamines (zyrtec  10mg  to 20mg ) and monitor symptoms closely.  If symptoms worsen or if you have severe symptoms including breathing issues, throat closure, significant swelling, whole body hives, severe diarrhea and vomiting, lightheadedness then use epinephrine  and seek immediate medical care afterwards. Emergency action plan given.  Skin: Continue proper skin care. Keep track of flares. Continue to avoid pork, lamb and beef jerky. Monitor symptoms after eating beef.  During hive flare start the following medications until symptoms subside. Allegra  180mg  1-2 times per day. Famotidine  20mg  twice a day. Avoid the following potential triggers: alcohol, tight clothing, NSAIDs, hot showers and getting overheated. Xolair  300mg  every 5 weeks.  If you have symptoms/breakouts then let us  know.   Environmental allergies 2023 skin testing positive to mold and dog.  Continue environmental control measures.  Use over the counter antihistamines such as Zyrtec  (cetirizine ), Claritin  (loratadine ), Allegra   (fexofenadine ), or Xyzal  (levocetirizine) daily as needed.   GERD Continue famotidine  20mg  twice a day.    Follow up in 6 weeks or sooner if needed.

## 2024-05-06 NOTE — Progress Notes (Signed)
 Erroneous encounter-disregard

## 2024-05-14 ENCOUNTER — Encounter: Payer: Self-pay | Admitting: Emergency Medicine

## 2024-05-14 ENCOUNTER — Ambulatory Visit: Admission: EM | Admit: 2024-05-14 | Discharge: 2024-05-14 | Disposition: A | Discharge status: Home / Self Care

## 2024-05-14 DIAGNOSIS — R400 Somnolence: Secondary | ICD-10-CM | POA: Insufficient documentation

## 2024-05-14 DIAGNOSIS — H00025 Hordeolum internum left lower eyelid: Secondary | ICD-10-CM | POA: Diagnosis not present

## 2024-05-14 MED ORDER — POLYMYXIN B-TRIMETHOPRIM 10000-0.1 UNIT/ML-% OP SOLN: 1.0000 [drp] | OPHTHALMIC | 0 refills | Status: AC

## 2024-05-14 NOTE — ED Provider Notes (Signed)
 [name]    CSN: [id number] Arrival date & time: 05/14/24  1525      History   Chief Complaint Chief Complaint  Patient presents with   Eye Problem    HPI Brianna Gutierrez is a 48 y.o. female.   Patient presents today for evaluation of left eye pain.  She reports that she has some pain to the bottom part of her left eyelid and this has been ongoing for the last week.  She thinks she may be developing a stye because she has noted some swelling.  She denies any vision changes.  She has not any other symptoms.  The history is provided by the patient.  Eye Problem Associated symptoms: no discharge, no nausea, no photophobia, no redness and no vomiting     Past Medical History:  Diagnosis Date   Abnormal Pap smear of cervix    Anxiety    Depression    Dysmenorrhea    Eczema    History of COVID-19 10/24/2020   loss of taste and smellbody aches and cough x 4 days all symptoms resolved   History of kidney stones    Hypertension    Pre-diabetes    PTSD (post-traumatic stress disorder)    Rash 08/31/2021   comes and goes on legs saw dermetology no rash now ? food allergy   Sleep apnea    SUI (stress urinary incontinence, female)    Trichomonas infection 02/2021   Uterine fibroid     Patient Active Problem List   Diagnosis Date Noted   Daytime somnolence 05/14/2024   Rash and other nonspecific skin eruption 05/08/2023   Lumbar radiculopathy 02/26/2023   Low back pain 02/11/2023   Neuralgia of left sciatic nerve 02/11/2023   Other adverse food reactions, not elsewhere classified, subsequent encounter 09/19/2022   Pruritus 08/15/2022   Essential hypertension 04/30/2022   Shortness of breath 04/19/2022   Other allergic rhinitis 04/19/2022   Chronic idiopathic urticaria 04/19/2022   Pulmonary embolism (HCC) 11/30/2021   Depression 11/15/2021   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   Smoking 10/26/2021   Postoperative intra-abdominal  abscess [facility])    Medication monitoring encounter    Pelvic infection in female 09/24/2021   Status post laparoscopic hysterectomy 09/11/2021   Cholelithiasis 01/13/2019   Pre-diabetes 11/15/2016   History of bilateral tubal ligation 09/28/2015   Tobacco abuse 09/28/2015    Past Surgical History:  Procedure Laterality Date   BLADDER SUSPENSION N/A 09/11/2021   Procedure: TRANSVAGINAL TAPE (TVT) PROCEDURE;  Surgeon: Cathlyn JAYSON Nikki Bobie FORBES, MD;  Location: [facility];  Service: Gynecology;  Laterality: N/A;   CESAREAN SECTION  1997   x 1   CHOLECYSTECTOMY N/A 01/14/2019   Procedure: LAPAROSCOPIC CHOLECYSTECTOMY WITH POSSIBLE  INTRAOPERATIVE CHOLANGIOGRAM;  Surgeon: Eletha Boas, MD;  Location: [facility];  Service: General;  Laterality: N/A;   CYSTOSCOPY N/A 09/11/2021   Procedure: PHYLLIS;  Surgeon: Cathlyn JAYSON Nikki Bobie FORBES, MD;  Location[facility];  Service: Gynecology;  Laterality: N/A;   CYSTOSCOPY/URETEROSCOPY/HOLMIUM LASER Left 01/31/2022   Procedure: LEFT URETEROSCOPY/HOLMIUM LASER;  Surgeon: Lovie Arlyss CROME, MD;  Location: [facility];  Service: Urology;  Laterality: Left;   DILITATION & CURRETTAGE/HYSTROSCOPY WITH HYDROTHERMAL ABLATION N/A 06/22/2016   Procedure: DILATATION & CURETTAGE/HYSTEROSCOPY WITH HYDROTHERMAL ABLATION;  Surgeon: Carlin DELENA Centers, MD;  Location: [facility];  Service: Gynecology;  Laterality: N/A;   TOTAL LAPAROSCOPIC HYSTERECTOMY WITH SALPINGECTOMY Bilateral 09/11/2021   Procedure: TOTAL LAPAROSCOPIC  HYSTERECTOMY WITH SALPINGECTOMY;  Surgeon: Cathlyn JAYSON Nikki Bobie FORBES, MD;  Location: [facility];  Service: Gynecology;  Laterality: Bilateral;   TUBAL LIGATION     09-28-2015    OB History     Gravida  4   Para  0   Term  0   Preterm  0   AB  0   Living  4      SAB  0   IAB  0   Ectopic  0   Multiple      Live Births  4            Home Medications    Prior to Admission medications    Medication Sig Start Date End Date Taking? Authorizing Provider  acetaminophen -codeine  (TYLENOL  #2) 300-15 MG tablet Take 1 tablet by mouth 3 (three) times daily as needed. 02/08/24  Yes [provider]  montelukast  (SINGULAIR ) 10 MG tablet Take 10 mg by mouth at bedtime. 02/23/24  Yes [provider]  naloxone [facility]) nasal spray 4 mg/0.1 mL SMARTSIG:Spray(s) Both Nares 04/29/24  Yes [provider]  naloxone (NARCAN) nasal spray 4 mg/0.1 mL SMARTSIG:Spray(s) Both Nares 02/29/24  Yes [provider]  trimethoprim -polymyxin b  (POLYTRIM ) ophthalmic solution Place 1 drop into the left eye every 4 (four) hours for 7 days. 05/14/24 05/21/24 Yes Billy Asberry FALCON, PA-C  Vitamin D, Ergocalciferol, (DRISDOL) 1.25 MG (50000 UNIT) CAPS capsule Take 50,000 Units by mouth once a week. 03/30/24  Yes [provider]  albuterol  (VENTOLIN  HFA) 108 (90 Base) MCG/ACT inhaler Inhale 2 puffs into the lungs every 4 (four) hours as needed for wheezing or shortness of breath (coughing fits). 05/06/24   Luke Orlan HERO, DO  amLODipine  (NORVASC ) 10 MG tablet Take 1 tablet (10 mg total) by mouth every evening. 01/08/24 05/06/24  Lorren Greig PARAS, NP  budesonide -formoterol  (SYMBICORT ) 80-4.5 MCG/ACT inhaler Inhale 2 puffs into the lungs daily. with spacer and rinse mouth afterwards. 05/06/24   Luke Orlan HERO, DO  DULoxetine  (CYMBALTA ) 30 MG capsule Take 1 capsule (30 mg total) by mouth daily. 03/10/24   Carvin Arvella HERO, MD  EPINEPHrine  0.3 mg/0.3 mL IJ SOAJ injection INJECT CONTENTS OF 1 PEN AS NEEDED FOR ALLERGIC REACTION 04/13/24   Lorren Greig PARAS, NP  famotidine  (PEPCID ) 20 MG tablet 1 tablet at bedtime as needed Orally Once a day    [provider]  gabapentin  (NEURONTIN ) 300 MG capsule Take 1 capsule (300 mg total) by mouth at bedtime. 01/08/24   Lorren Greig PARAS, NP  glipiZIDE (GLUCOTROL XL) 2.5 MG 24 hr tablet Take 2.5 mg by mouth daily. 04/13/24   [provider]  hydrochlorothiazide   (HYDRODIURIL ) 25 MG tablet Take 1 tablet (25 mg total) by mouth daily. 01/08/24 05/06/24  Lorren Greig PARAS, NP  lamoTRIgine  (LAMICTAL ) 100 MG tablet Take 1 tablet (100 mg total) by mouth daily. 04/16/24 07/15/24  Carvin Arvella HERO, MD  oxyCODONE  (OXY IR/ROXICODONE ) 5 MG immediate release tablet Take 5 mg by mouth 4 (four) times daily as needed. 04/25/24   [provider]  rosuvastatin (CRESTOR) 5 MG tablet Take 5 mg by mouth every evening. 03/31/24   [provider]  XOLAIR  150 MG/ML prefilled syringe INJECT 300MG  SUBCUTANEOUSLY  EVERY 4 WEEKS 08/27/23   Luke Orlan HERO, DO  dicyclomine  (BENTYL ) 20 MG tablet Take 1 tablet (20 mg total) by mouth 2 (two) times daily. 10/29/19 08/24/20  Babara, Amy V, PA-C  omeprazole  (PRILOSEC) 20 MG capsule Take  1 capsule (20 mg total) by mouth daily. 01/01/19 08/07/19  Arloa Suzen RAMAN, NP    Family History Family History  Problem Relation Age of Onset   Colon polyps Sister    Hypertension Maternal Grandmother    Diabetes Maternal Grandfather    Allergic rhinitis Daughter    Allergic rhinitis Daughter    Allergic rhinitis Son    Allergic rhinitis Son    Colon cancer Neg Hx    Esophageal cancer Neg Hx    Rectal cancer Neg Hx    Stomach cancer Neg Hx     Social History Social History   Tobacco Use   Smoking status: Every Day    Current packs/day: 0.50    Average packs/day: 0.5 packs/day for 20.0 years (10.0 ttl pk-yrs)    Types: Cigarettes    Passive exposure: Never   Smokeless tobacco: Never  Vaping Use   Vaping status: Former   Substances: Nicotine   Substance Use Topics   Alcohol  use: Not Currently   Drug use: Never     Allergies   Pork-derived products and Tramadol    Review of Systems Review of Systems  Constitutional:  Negative for chills and fever.  Eyes:  Positive for pain. Negative for photophobia, discharge, redness and visual disturbance.  Gastrointestinal:  Negative for abdominal pain, nausea and vomiting.     Physical  Exam Triage Vital Signs ED Triage Vitals  Encounter Vitals Group     BP 05/14/24 1536 112/79     Girls Systolic BP Percentile --      Girls Diastolic BP Percentile --      Boys Systolic BP Percentile --      Boys Diastolic BP Percentile --      Pulse Rate 05/14/24 1536 96     Resp 05/14/24 1536 18     Temp 05/14/24 1536 97.9 F (36.6 C)     Temp Source 05/14/24 1536 Oral     SpO2 05/14/24 1536 98 %     Weight 05/14/24 1535 197 lb 12 oz (89.7 kg)     Height --      Head Circumference --      Peak Flow --      Pain Score 05/14/24 1534 4     Pain Loc --      Pain Education --      Exclude from Growth Chart --    No data found.  Updated Vital Signs BP 112/79 (BP Location: Left Arm)   Pulse 96   Temp 97.9 F (36.6 C) (Oral)   Resp 18   Wt 197 lb 12 oz (89.7 kg)   LMP  (LMP Unknown)   SpO2 98%   BMI 36.17 kg/m   Visual Acuity Right Eye Distance:   Left Eye Distance:   Bilateral Distance:    Right Eye Near:   Left Eye Near:    Bilateral Near:     Physical Exam Vitals and nursing note reviewed.  Constitutional:      General: She is not in acute distress.    Appearance: Normal appearance. She is not ill-appearing.  HENT:     Head: Normocephalic and atraumatic.  Eyes:     Extraocular Movements: Extraocular movements intact.     Conjunctiva/sclera: Conjunctivae normal.     Pupils: Pupils are equal, round, and reactive to light.     Comments: Minimal swelling appreciated to the lateral left lower eyelid  Cardiovascular:     Rate and Rhythm: Normal rate.  Pulmonary:  Effort: Pulmonary effort is normal. No respiratory distress.  Neurological:     Mental Status: She is alert.  Psychiatric:        Mood and Affect: Mood normal.        Behavior: Behavior normal.        Thought Content: Thought content normal.      UC Treatments / Results  Labs (all labs ordered are listed, but only abnormal results are displayed) Labs Reviewed - No data to  display  EKG   Radiology No results found.  Procedures Procedures (including critical care time)  Medications Ordered in UC Medications - No data to display  Initial Impression / Assessment and Plan / UC Course  I have reviewed the triage vital signs and the nursing notes.  Pertinent labs & imaging results that were available during my care of the patient were reviewed by me and considered in my medical decision making (see chart for details).     Suspect early hordeolum.  Recommended alternating warm and cold compresses and will treat with antibiotic drops to cover secondary infection.  Encouraged follow-up if no gradual improvement with any further concerns.   Final Clinical Impressions(s) / UC Diagnoses   Final diagnoses:  Hordeolum internum of left lower eyelid   Discharge Instructions   None    ED Prescriptions     Medication Sig Dispense Auth. Provider   trimethoprim -polymyxin b  (POLYTRIM ) ophthalmic solution Place 1 drop into the left eye every 4 (four) hours for 7 days. 10 mL Billy Asberry FALCON, PA-C      PDMP not reviewed this encounter.   Billy Asberry FALCON, PA-C 05/14/24 [phone]

## 2024-05-14 NOTE — ED Triage Notes (Signed)
 Pt presents c/o left eye pain x 1 week. Pt says she thinks she has a stye on her bottom lid.

## 2024-05-19 DIAGNOSIS — G4733 Obstructive sleep apnea (adult) (pediatric): Secondary | ICD-10-CM | POA: Diagnosis not present

## 2024-05-27 ENCOUNTER — Other Ambulatory Visit (HOSPITAL_COMMUNITY)
Admission: RE | Admit: 2024-05-27 | Discharge: 2024-05-27 | Disposition: A | Discharge status: Home / Self Care | Source: Ambulatory Visit | Attending: Obstetrics and Gynecology | Admitting: Obstetrics and Gynecology

## 2024-05-27 ENCOUNTER — Encounter: Payer: Self-pay | Admitting: Obstetrics and Gynecology

## 2024-05-27 ENCOUNTER — Ambulatory Visit (INDEPENDENT_AMBULATORY_CARE_PROVIDER_SITE_OTHER): Admitting: Obstetrics and Gynecology

## 2024-05-27 VITALS — BP 122/80 | HR 88 | Ht 62.5 in | Wt 196.0 lb

## 2024-05-27 DIAGNOSIS — Z1159 Encounter for screening for other viral diseases: Secondary | ICD-10-CM

## 2024-05-27 DIAGNOSIS — Z01419 Encounter for gynecological examination (general) (routine) without abnormal findings: Secondary | ICD-10-CM | POA: Diagnosis not present

## 2024-05-27 DIAGNOSIS — Z113 Encounter for screening for infections with a predominantly sexual mode of transmission: Secondary | ICD-10-CM | POA: Diagnosis present

## 2024-05-27 DIAGNOSIS — Z114 Encounter for screening for human immunodeficiency virus [HIV]: Secondary | ICD-10-CM

## 2024-05-27 DIAGNOSIS — Z1331 Encounter for screening for depression: Secondary | ICD-10-CM | POA: Diagnosis not present

## 2024-05-27 NOTE — Patient Instructions (Signed)

## 2024-05-27 NOTE — Progress Notes (Signed)
 48 y.o. [id number] Legally Separated African American female here for annual exam.    She declines a breast exam today.  Had her mammogram today at [facility].   Her hot flashes are manageable.   Takes Gabapentin  at night for pain, and this helps her hot flashes.   Has had a sexual partner since her last visit.  Desires STD screening.   Has good bladder control.   Has chronic back pain.  Has arthritis in her back. Goes [facility].   PCP: Lorren Greig PARAS, NP   No LMP recorded (lmp unknown). Patient has had a hysterectomy.           Sexually active: No.  The current method of family planning is status post hysterectomy.    Menopausal hormone therapy:  n/a Exercising: No.   Smoker:  yes  OB History  Gravida Para Term Preterm AB Living  4 0 0 0 0 4  SAB IAB Ectopic Multiple Live Births  0 0 0  4    # Outcome Date GA Lbr Len/2nd Weight Sex Type Anes PTL Lv  4 Gravida     F Vag-Spont   LIV  3 Gravida     M CS-Unspec   LIV  2 Gravida     F Vag-Spont   LIV  1 Gravida     M Vag-Spont  Y LIV     HEALTH MAINTENANCE: Last 2 paps:  03/03/21 neg HR HPV neg, 11/13/16 neg HPV neg  History of abnormal Pap or positive HPV:  no Mammogram:   Per patient she had MMG done today. 03/30/22 Breast Density Cat C, BIRADS Cat 2 benign  Colonoscopy:  08/18/21 - benign polyps - due in 2032 Bone Density:  n/a  Result  n/a   Immunization History  Administered Date(s) Administered   Influenza,inj,Quad PF,6+ Mos 09/26/2016, 11/22/2020, 07/27/2022   PFIZER(Purple Top)SARS-COV-2 Vaccination 01/10/2020, 02/07/2020, 09/29/2020   Tdap 11/22/2020      reports that she has been smoking cigarettes. She has a 10 pack-year smoking history. She has never been exposed to tobacco smoke. She has never used smokeless tobacco. She reports that she does not currently use alcohol . She reports that she does not use drugs.  Past Medical History:  Diagnosis Date   Abnormal Pap smear of cervix    Anxiety     Depression    Diabetes mellitus without complication (HCC)    Dysmenorrhea    Eczema    History of COVID-19 10/24/2020   loss of taste and smellbody aches and cough x 4 days all symptoms resolved   History of kidney stones    Hypertension    Pre-diabetes    PTSD (post-traumatic stress disorder)    Pulmonary embolus (HCC) 2022   post op from hysterectomy   Rash 08/31/2021   comes and goes on legs saw dermetology no rash now ? food allergy   Sleep apnea    SUI (stress urinary incontinence, female)    Trichomonas infection 02/2021   Uterine fibroid     Past Surgical History:  Procedure Laterality Date   BLADDER SUSPENSION N/A 09/11/2021   Procedure: TRANSVAGINAL TAPE (TVT) PROCEDURE;  Surgeon: Cathlyn JAYSON Nikki Bobie FORBES, MD;  Location: [facility];  Service: Gynecology;  Laterality: N/A;   CESAREAN SECTION  1997   x 1   CHOLECYSTECTOMY N/A 01/14/2019   Procedure: LAPAROSCOPIC CHOLECYSTECTOMY WITH POSSIBLE  INTRAOPERATIVE CHOLANGIOGRAM;  Surgeon: Eletha Boas, MD;  Location: [facility];  Service:  General;  Laterality: N/A;   CYSTOSCOPY N/A 09/11/2021   Procedure: CYSTOSCOPY;  Surgeon: Cathlyn JAYSON Nikki Bobie FORBES, MD;  Location: [facility];  Service: Gynecology;  Laterality: N/A;   CYSTOSCOPY/URETEROSCOPY/HOLMIUM LASER Left 01/31/2022   Procedure: LEFT URETEROSCOPY/HOLMIUM LASER;  Surgeon: Lovie Arlyss CROME, MD;  Location: [facility];  Service: Urology;  Laterality: Left;   DILITATION & CURRETTAGE/HYSTROSCOPY WITH HYDROTHERMAL ABLATION N/A 06/22/2016   Procedure: DILATATION & CURETTAGE/HYSTEROSCOPY WITH HYDROTHERMAL ABLATION;  Surgeon: Carlin DELENA Centers, MD;  Location: [facility];  Service: Gynecology;  Laterality: N/A;   TOTAL LAPAROSCOPIC HYSTERECTOMY WITH SALPINGECTOMY Bilateral 09/11/2021   Procedure: TOTAL LAPAROSCOPIC HYSTERECTOMY WITH SALPINGECTOMY;  Surgeon: Cathlyn JAYSON Nikki Bobie FORBES, MD;  Location: [facility];  Service: Gynecology;  Laterality:  Bilateral;   TUBAL LIGATION     09-28-2015    Current Outpatient Medications  Medication Sig Dispense Refill   albuterol  (VENTOLIN  HFA) 108 (90 Base) MCG/ACT inhaler Inhale 2 puffs into the lungs every 4 (four) hours as needed for wheezing or shortness of breath (coughing fits). 18 g 1   amLODipine  (NORVASC ) 10 MG tablet Take 1 tablet (10 mg total) by mouth every evening. 90 tablet 0   budesonide -formoterol  (SYMBICORT ) 80-4.5 MCG/ACT inhaler Inhale 2 puffs into the lungs daily. with spacer and rinse mouth afterwards. 1 each 3   DULoxetine  (CYMBALTA ) 30 MG capsule Take 1 capsule (30 mg total) by mouth daily. 30 capsule 2   EPINEPHrine  0.3 mg/0.3 mL IJ SOAJ injection INJECT CONTENTS OF 1 PEN AS NEEDED FOR ALLERGIC REACTION 2 each 2   famotidine  (PEPCID ) 20 MG tablet 1 tablet at bedtime as needed Orally Once a day     gabapentin  (NEURONTIN ) 300 MG capsule Take 1 capsule (300 mg total) by mouth at bedtime. 90 capsule 0   glipiZIDE (GLUCOTROL XL) 2.5 MG 24 hr tablet Take 2.5 mg by mouth daily.     hydrochlorothiazide  (HYDRODIURIL ) 25 MG tablet Take 1 tablet (25 mg total) by mouth daily. 90 tablet 0   lamoTRIgine  (LAMICTAL ) 100 MG tablet Take 1 tablet (100 mg total) by mouth daily. 90 tablet 0   montelukast  (SINGULAIR ) 10 MG tablet Take 10 mg by mouth at bedtime.     naloxone (NARCAN) nasal spray 4 mg/0.1 mL SMARTSIG:Spray(s) Both Nares     naloxone (NARCAN) nasal spray 4 mg/0.1 mL SMARTSIG:Spray(s) Both Nares     oxyCODONE  (OXY IR/ROXICODONE ) 5 MG immediate release tablet Take 5 mg by mouth 4 (four) times daily as needed.     rosuvastatin (CRESTOR) 5 MG tablet Take 5 mg by mouth every evening.     Vitamin D, Ergocalciferol, (DRISDOL) 1.25 MG (50000 UNIT) CAPS capsule Take 50,000 Units by mouth once a week.     XOLAIR  150 MG/ML prefilled syringe INJECT 300MG  SUBCUTANEOUSLY  EVERY 4 WEEKS 2 mL 11   Current Facility-Administered Medications  Medication Dose Route Frequency Provider Last Rate Last  Admin   omalizumab  (XOLAIR ) prefilled syringe 300 mg  300 mg Subcutaneous Q28 days Cheryl Reusing, FNP   300 mg at 04/29/24 1436    ALLERGIES: Pork-derived products and Tramadol   Family History  Problem Relation Age of Onset   Colon polyps Sister    Hypertension Maternal Grandmother    Diabetes Maternal Grandfather    Allergic rhinitis Daughter    Allergic rhinitis Daughter    Allergic rhinitis Son    Allergic rhinitis Son    Colon cancer Neg Hx    Esophageal cancer Neg Hx  Rectal cancer Neg Hx    Stomach cancer Neg Hx     Review of Systems  All other systems reviewed and are negative.   PHYSICAL EXAM:  BP 122/80 (BP Location: Left Arm, Patient Position: Sitting)   Pulse 88   Ht 5' 2.5 (1.588 m)   Wt 196 lb (88.9 kg)   LMP  (LMP Unknown)   SpO2 97%   BMI 35.28 kg/m     General appearance: alert, cooperative and appears stated age Head: normocephalic, without obvious abnormality, atraumatic Neck: no adenopathy, supple, symmetrical, trachea midline and thyroid  normal to inspection and palpation Lungs: clear to auscultation bilaterally Breasts: normal appearance, no masses or tenderness, No nipple retraction or dimpling, No nipple discharge or bleeding, No axillary adenopathy Heart: regular rate and rhythm Abdomen: soft, non-tender; no masses, no organomegaly Extremities: extremities normal, atraumatic, no cyanosis or edema Skin: skin color, texture, turgor normal. No rashes or lesions Lymph nodes: cervical, supraclavicular, and axillary nodes normal. Neurologic: grossly normal  Pelvic: External genitalia:  no lesions              No abnormal inguinal nodes palpated.              Urethra:  normal appearing urethra with no masses, tenderness or lesions              Bartholins and Skenes: normal                 Vagina: normal appearing vagina with normal color and discharge, no lesions              Cervix:  absent              Pap taken: no Bimanual Exam:  Uterus:   absent              Adnexa: no mass, fullness, tenderness              Rectal exam: yes.  Confirms.              Anus:  normal sphincter tone, no lesions  Chaperone was present for exam:  Kari HERO, CMA  ASSESSMENT: Well woman visit with gynecologic exam. Status post laparoscopic hysterectomy with bilateral salpingectomy, TVT, cystoscopy. Post op pelvic abscess.  Post op PE.  Depression and anxiety.  On Cymbalta .  Hx elevated A1C.  On Glipizide.  Chronic back pain.  Goes to [facility].  PHQ-2-9: 1.  Hx depression and anxiety.  On Cymbalta .  PLAN: Mammogram screening discussed. Self breast awareness reviewed. Pap and HRV collected:  no.  Not indicated.  Guidelines for Calcium, Vitamin D, regular exercise program including cardiovascular and weight bearing exercise. Medication refills:  NA STD screening.  Follow up:  yearly and prn.

## 2024-05-28 ENCOUNTER — Ambulatory Visit: Payer: Self-pay | Admitting: Obstetrics and Gynecology

## 2024-05-28 LAB — HIV ANTIBODY (ROUTINE TESTING W REFLEX): HIV 1&2 Ab, 4th Generation: NONREACTIVE

## 2024-05-28 LAB — RPR: RPR Ser Ql: NONREACTIVE

## 2024-05-28 LAB — HEPATITIS C ANTIBODY: Hepatitis C Ab: NONREACTIVE

## 2024-05-29 LAB — CERVICOVAGINAL ANCILLARY ONLY
Chlamydia: NEGATIVE
Comment: NEGATIVE
Comment: NEGATIVE
Comment: NORMAL
Neisseria Gonorrhea: NEGATIVE
Trichomonas: NEGATIVE

## 2024-06-01 ENCOUNTER — Ambulatory Visit: Payer: Self-pay | Admitting: Obstetrics and Gynecology
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
## 2024-06-02 DIAGNOSIS — E119 Type 2 diabetes mellitus without complications: Secondary | ICD-10-CM | POA: Diagnosis not present

## 2024-06-02 DIAGNOSIS — E7800 Pure hypercholesterolemia, unspecified: Secondary | ICD-10-CM | POA: Diagnosis not present

## 2024-06-02 DIAGNOSIS — E6609 Other obesity due to excess calories: Secondary | ICD-10-CM | POA: Diagnosis not present

## 2024-06-02 DIAGNOSIS — G8929 Other chronic pain: Secondary | ICD-10-CM | POA: Diagnosis not present

## 2024-06-02 DIAGNOSIS — Z79899 Other long term (current) drug therapy: Secondary | ICD-10-CM | POA: Diagnosis not present

## 2024-06-02 DIAGNOSIS — M51361 Other intervertebral disc degeneration, lumbar region with lower extremity pain only: Secondary | ICD-10-CM | POA: Diagnosis not present

## 2024-06-02 DIAGNOSIS — E559 Vitamin D deficiency, unspecified: Secondary | ICD-10-CM | POA: Diagnosis not present

## 2024-06-02 DIAGNOSIS — F17210 Nicotine dependence, cigarettes, uncomplicated: Secondary | ICD-10-CM | POA: Diagnosis not present

## 2024-06-03 ENCOUNTER — Ambulatory Visit

## 2024-06-05 ENCOUNTER — Encounter (HOSPITAL_COMMUNITY): Payer: Self-pay | Admitting: Psychiatry

## 2024-06-05 ENCOUNTER — Telehealth (HOSPITAL_COMMUNITY): Admitting: Psychiatry

## 2024-06-05 DIAGNOSIS — F331 Major depressive disorder, recurrent, moderate: Secondary | ICD-10-CM

## 2024-06-05 DIAGNOSIS — F4310 Post-traumatic stress disorder, unspecified: Secondary | ICD-10-CM

## 2024-06-05 MED ORDER — LAMOTRIGINE 100 MG PO TABS
100.0000 mg | ORAL_TABLET | Freq: Every day | ORAL | 0 refills | Status: DC
Start: 2024-07-14 — End: 2024-08-13

## 2024-06-16 NOTE — Progress Notes (Unsigned)
 Follow Up Note  RE: Brianna Gutierrez MRN: [id number] DOB: 1976/02/08 Date of Office Visit: 06/17/2024  Referring provider: Lorren Greig PARAS, NP Primary care provider: Lorren Greig PARAS, NP  Chief Complaint: Urticaria  History of Present Illness: I had the pleasure of seeing Brianna Gutierrez for a follow up visit at the [facility] on 06/17/2024. She is a 48 y.o. 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 Her previous allergy office visit was on 05/06/2024 with Dr. Luke. Today is a regular follow up visit.  Discussed the use of AI scribe software for clinical note transcription with the patient, who gave verbal consent to proceed.  History of Present Illness   Her preauthorization for Xolair  expired at the end of July, resulting in a delay in receiving her scheduled injection. She is currently one to two weeks overdue for her Xolair  shot, which she typically receives at a dose of 300 mg every five weeks.  She experienced an allergic reaction after consuming cherries a few days ago, which resulted in itching and a rash. She used her EpiPen , which alleviated the symptoms by the following morning. She is unsure if the reaction was due to the cherries or the delay in her medication. 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  She continues to take Allegra  and famotidine  twice daily. She also uses a rescue inhaler, which she has used three times recently, and takes Symbicort  two puffs once daily without side effects.        ***  Assessment and Plan: Brianna Gutierrez is a 48 y.o. female with: Chronic idiopathic urticaria Past history - Pruritic rash for 1 year lasting 1 day at a time. No triggers noted. Occurs a few times per month. Concerned whether strawberries, bananas and seafood may be contributing to the symptoms. Saw dermatology virtually in the  past with no benefit.  Interim history - Chronic urticaria with inappropriate Epipen  use for itching only recently. Stopped antihistamines. Continue proper skin care. Keep track of flares. Continue to avoid pork, lamb and beef jerky. Monitor symptoms after eating beef.  During hive flare start the following medications until symptoms subside. Allegra  180mg  1-2 times per day. Famotidine  20mg  twice a day. Avoid the following potential triggers: alcohol , tight clothing, NSAIDs, hot showers and getting overheated. Xolair  300mg  every 5 weeks.  If you have symptoms/breakouts then let us  know.   Do NOT use the Epipen  only for symptoms for itching.  Epipen  prescribed due to Xolair  treatment and risk for anaphylactic reaction due to Xolair .   Dyspnea on exertion Past history - Dyspnea on exertion and uses albuterol  as needed with good benefit. Has been using it daily since prescribed 1 month ago. 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 CT chest in April 2023 was unremarkable. 2023 spirometry showed restriction with 2% improvement FEV1 postbronchodilator treatment. Clinically feeling improved  Interim history - not sure if having DOE due to physical deconditioning or mild asthma/RAD as sometimes it takes over 5-15 minutes to catch her breath. Stopped smoking. Today's spirometry showed some restriction. Daily controller medication(s): start Symbicort  80mcg 2 puffs once a day with spacer and rinse mouth afterwards. May use albuterol  rescue inhaler 2 puffs or nebulizer every 4 to 6 hours as needed for shortness of breath, chest tightness, coughing, and wheezing. May use albuterol  rescue inhaler 2 puffs 5  to 15 minutes prior to strenuous physical activities. Monitor frequency of use - if you need to use it more than twice per week on a consistent basis let us  know.  Get spirometry at next visit. If no improvement will stop Symbicort .    Other adverse food reactions, not  elsewhere classified, subsequent encounter Past history - 2024 bloodwork positive to pork and lamb. Tolerates bananas and strawberries.  Continue to avoid pork, lamb and beef jerky. Monitor symptoms after eating beef.    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  Continue environmental control measures.    GERD Continue famotidine  20mg  twice a day.   Assessment and Plan    Chronic idiopathic urticaria with recurrent allergic reactions Experienced itching and rash after consuming cherries, indicating a new allergen. Symptoms resolved with EpiPen . Delay in Xolair  injection due to expired preauthorization may have exacerbated symptoms. - Administer sample Xolair  injection today if available. - Avoid cherries, pork, lamb, beef, and jerky. - Continue Allegra  and famotidine  twice daily. - Use EpiPen  for severe allergic reactions. - Coordinate with Tammy for Xolair  shipment and preauthorization paperwork.  Asthma Managed with Symbicort  and rescue inhaler. Recent increased use of rescue inhaler suggests possible suboptimal control. - Increase Symbicort  to two puffs twice daily. - Continue using rescue inhaler as needed.  Gastroesophageal reflux disease (GERD) Managed with medication, no new symptoms or changes reported. - Continue current GERD medication.         Return in about 3 months (around 09/17/2024).  No orders of the defined types were placed in this encounter.  Lab Orders  No laboratory test(s) ordered today    Diagnostics: Spirometry:  Tracings reviewed. Her effort: Good reproducible efforts. FVC: 1.89L FEV1: 1.56L, 68% predicted FEV1/FVC ratio: 83% Interpretation: Spirometry consistent with normal pattern.  Please see scanned spirometry results for details.  Results discussed with patient/family.   Medication List:  Current Outpatient Medications  Medication Sig  Dispense Refill   albuterol  (VENTOLIN  HFA) 108 (90 Base) MCG/ACT inhaler Inhale 2 puffs into the lungs every 4 (four) hours as needed for wheezing or shortness of breath (coughing fits). 18 g 1   amLODipine  (NORVASC ) 10 MG tablet Take 1 tablet (10 mg total) by mouth every evening. 90 tablet 0   budesonide -formoterol  (SYMBICORT ) 80-4.5 MCG/ACT inhaler Inhale 2 puffs into the lungs daily. with spacer and rinse mouth afterwards. 1 each 3   EPINEPHrine  0.3 mg/0.3 mL IJ SOAJ injection INJECT CONTENTS OF 1 PEN AS NEEDED FOR ALLERGIC REACTION 2 each 2   famotidine  (PEPCID ) 20 MG tablet 1 tablet at bedtime as needed Orally Once a day     gabapentin  (NEURONTIN ) 300 MG capsule Take 1 capsule (300 mg total) by mouth at bedtime. 90 capsule 0   glipiZIDE (GLUCOTROL XL) 2.5 MG 24 hr tablet Take 2.5 mg by mouth daily.     hydrochlorothiazide  (HYDRODIURIL ) 25 MG tablet Take 1 tablet (25 mg total) by mouth daily. 90 tablet 0   [START ON 07/14/2024] lamoTRIgine  (LAMICTAL ) 100 MG tablet Take 1 tablet (100 mg total) by mouth daily. 90 tablet 0   montelukast  (SINGULAIR ) 10 MG tablet Take 10 mg by mouth at bedtime.     naloxone (NARCAN) nasal spray 4 mg/0.1 mL SMARTSIG:Spray(s) Both Nares     naloxone (NARCAN) nasal spray 4 mg/0.1 mL SMARTSIG:Spray(s) Both Nares     oxyCODONE  (OXY IR/ROXICODONE ) 5 MG immediate release tablet  Take 5 mg by mouth 4 (four) times daily as needed.     rosuvastatin (CRESTOR) 5 MG tablet Take 5 mg by mouth every evening.     Vitamin D , Ergocalciferol , (DRISDOL) 1.25 MG (50000 UNIT) CAPS capsule Take 50,000 Units by mouth once a week.     XOLAIR  150 MG/ML prefilled syringe INJECT 300MG  SUBCUTANEOUSLY  EVERY 4 WEEKS 2 mL 11   Current Facility-Administered Medications  Medication Dose Route Frequency Provider Last Rate Last Admin   omalizumab  (XOLAIR ) prefilled syringe 300 mg  300 mg Subcutaneous Q28 days Cheryl Reusing, FNP   300 mg at 06/17/24 1613   Allergies: Allergies  Allergen  Reactions   Pork-Derived Products    Tramadol      rash   I reviewed her past medical history, social history, family history, and environmental history and no significant changes have been reported from her previous visit.  Review of Systems  Constitutional:  Negative for appetite change, chills, fever and unexpected weight change.  HENT:  Positive for congestion. Negative for rhinorrhea.   Eyes:  Negative for itching.  Respiratory:  Positive for cough. Negative for chest tightness, shortness of breath and wheezing.   Cardiovascular:  Negative for chest pain.  Gastrointestinal:  Negative for abdominal pain.  Genitourinary:  Negative for difficulty urinating.  Skin:  Positive for rash.  Allergic/Immunologic: Positive for environmental allergies.  Neurological:  Negative for headaches.    Objective: BP 102/68   Pulse 78   Resp 18   LMP  (LMP Unknown)   SpO2 98%  There is no height or weight on file to calculate BMI. Physical Exam Vitals and nursing note reviewed.  Constitutional:      Appearance: Normal appearance. She is well-developed. She is obese.  HENT:     Head: Normocephalic and atraumatic.     Right Ear: Tympanic membrane and external ear normal.     Left Ear: Tympanic membrane and external ear normal.     Nose: Nose normal.     Mouth/Throat:     Mouth: Mucous membranes are moist.  Eyes:     Conjunctiva/sclera: Conjunctivae normal.  Cardiovascular:     Rate and Rhythm: Normal rate and regular rhythm.     Heart sounds: Normal heart sounds. No murmur heard.    No friction rub. No gallop.  Pulmonary:     Effort: Pulmonary effort is normal.     Breath sounds: Normal breath sounds. No wheezing, rhonchi or rales.  Musculoskeletal:     Cervical back: Neck supple.  Skin:    General: Skin is warm.     Findings: No rash.  Neurological:     Mental Status: She is alert and oriented to person, place, and time.  Psychiatric:        Behavior: Behavior normal.     Previous notes and tests were reviewed. The plan was reviewed with the patient/family, and all questions/concerned were addressed.  It was my pleasure to see Porshe today and participate in her care. Please feel free to contact me with any questions or concerns.  Sincerely,  Orlan Cramp, DO Allergy & Immunology  [facility]  [place] office: [phone] [facility] office: [phone]

## 2024-06-17 ENCOUNTER — Ambulatory Visit (INDEPENDENT_AMBULATORY_CARE_PROVIDER_SITE_OTHER): Admitting: Allergy

## 2024-06-17 ENCOUNTER — Encounter: Payer: Self-pay | Admitting: Allergy

## 2024-06-17 VITALS — BP 102/68 | HR 78 | Resp 18

## 2024-06-17 DIAGNOSIS — J3081 Allergic rhinitis due to animal (cat) (dog) hair and dander: Secondary | ICD-10-CM

## 2024-06-17 DIAGNOSIS — L501 Idiopathic urticaria: Secondary | ICD-10-CM | POA: Diagnosis not present

## 2024-06-17 DIAGNOSIS — K219 Gastro-esophageal reflux disease without esophagitis: Secondary | ICD-10-CM

## 2024-06-17 DIAGNOSIS — J3089 Other allergic rhinitis: Secondary | ICD-10-CM

## 2024-06-17 DIAGNOSIS — R0609 Other forms of dyspnea: Secondary | ICD-10-CM | POA: Diagnosis not present

## 2024-06-17 DIAGNOSIS — T781XXD Other adverse food reactions, not elsewhere classified, subsequent encounter: Secondary | ICD-10-CM | POA: Diagnosis not present

## 2024-06-17 NOTE — Patient Instructions (Addendum)
 Breathing Daily controller medication(s): Increase Symbicort  80mcg 2 puffs to TWICE a day with spacer and rinse mouth afterwards. May use albuterol  rescue inhaler 2 puffs or nebulizer every 4 to 6 hours as needed for shortness of breath, chest tightness, coughing, and wheezing. May use albuterol  rescue inhaler 2 puffs 5 to 15 minutes prior to strenuous physical activities. Monitor frequency of use - if you need to use it more than twice per week on a consistent basis let us  know.  Breathing control goals:  Full participation in all desired activities (may need albuterol  before activity) Albuterol  use two times or less a week on average (not counting use with activity) Cough interfering with sleep two times or less a month Oral steroids no more than once a year No hospitalizations   Do NOT use the Epipen  only for symptoms for itching.  For mild symptoms you can take over the counter antihistamines (zyrtec  10mg  to 20mg ) and monitor symptoms closely.  If symptoms worsen or if you have severe symptoms including breathing issues, throat closure, significant swelling, whole body hives, severe diarrhea and vomiting, lightheadedness then use epinephrine  and seek immediate medical care afterwards. Emergency action plan in place.   Skin: Continue proper skin care. Keep track of flares. Continue to avoid pork, lamb and beef jerky, cherry.  Monitor symptoms after eating beef.  During hive flare start the following medications until symptoms subside. Allegra  180mg  1-2 times per day. Famotidine  20mg  twice a day. Avoid the following potential triggers: alcohol , tight clothing, NSAIDs, hot showers and getting overheated. Xolair  300mg  every 5 weeks - given today. [employer]my will be in touch with you regarding the reapproval.  If you have symptoms/breakouts then let us  know.   Environmental allergies 2023 skin testing positive to mold and dog.  Continue environmental control measures.  Use over the counter  antihistamines such as Zyrtec  (cetirizine ), Claritin  (loratadine ), Allegra  (fexofenadine ), or Xyzal  (levocetirizine) daily as needed.   GERD Continue famotidine  20mg  twice a day.    Follow up in 3 months or sooner if needed.

## 2024-06-18 ENCOUNTER — Telehealth: Payer: Self-pay | Admitting: Allergy

## 2024-06-18 NOTE — Telephone Encounter (Signed)
 PT called to advise that insurance had informed her they would not cover Xolair , she requested PA be completed again, I advised that I would get it sent back

## 2024-06-19 ENCOUNTER — Encounter: Payer: Self-pay | Admitting: Allergy

## 2024-06-22 NOTE — Telephone Encounter (Signed)
 Have submitted appeal for same

## 2024-06-23 NOTE — Telephone Encounter (Signed)
 Called patient and advised approval done now

## 2024-07-07 NOTE — Telephone Encounter (Addendum)
 Received fax from [employer] - DOB/NEED DPR  verified - advising they have made several attempts to contact the patient to fill out her Xolair .  Called patient - DOB/NEED DPR verified - LMOVM regarding above notation.  Copy of fax sent to Tammy, Musician.  If/When patient call back - she will need to contact [facility] @ [phone].

## 2024-07-22 ENCOUNTER — Ambulatory Visit (INDEPENDENT_AMBULATORY_CARE_PROVIDER_SITE_OTHER)

## 2024-07-22 DIAGNOSIS — L501 Idiopathic urticaria: Secondary | ICD-10-CM | POA: Diagnosis not present

## 2024-07-25 ENCOUNTER — Emergency Department (HOSPITAL_COMMUNITY)
Admission: EM | Admit: 2024-07-25 | Discharge: 2024-07-26 | Discharge status: Against Medical Advice | Attending: Emergency Medicine | Admitting: Emergency Medicine

## 2024-07-25 DIAGNOSIS — Z5321 Procedure and treatment not carried out due to patient leaving prior to being seen by health care provider: Secondary | ICD-10-CM | POA: Insufficient documentation

## 2024-07-25 DIAGNOSIS — M25521 Pain in right elbow: Secondary | ICD-10-CM | POA: Insufficient documentation

## 2024-07-26 ENCOUNTER — Encounter (HOSPITAL_COMMUNITY): Payer: Self-pay

## 2024-07-26 ENCOUNTER — Other Ambulatory Visit: Payer: Self-pay

## 2024-07-26 ENCOUNTER — Emergency Department (HOSPITAL_COMMUNITY)

## 2024-07-26 DIAGNOSIS — M25521 Pain in right elbow: Secondary | ICD-10-CM | POA: Diagnosis not present

## 2024-07-26 DIAGNOSIS — M25421 Effusion, right elbow: Secondary | ICD-10-CM | POA: Diagnosis not present

## 2024-07-26 NOTE — ED Triage Notes (Signed)
 Pt arrived from home via POV c/o right elbow pain s/p fall approx 3 weeks ago. Pt noted to have limited ROM. Pain 8/10, described as aching

## 2024-07-31 DIAGNOSIS — Z79899 Other long term (current) drug therapy: Secondary | ICD-10-CM | POA: Diagnosis not present

## 2024-07-31 DIAGNOSIS — E6609 Other obesity due to excess calories: Secondary | ICD-10-CM | POA: Diagnosis not present

## 2024-07-31 DIAGNOSIS — M51361 Other intervertebral disc degeneration, lumbar region with lower extremity pain only: Secondary | ICD-10-CM | POA: Diagnosis not present

## 2024-07-31 DIAGNOSIS — M5451 Vertebrogenic low back pain: Secondary | ICD-10-CM | POA: Diagnosis not present

## 2024-07-31 DIAGNOSIS — M25762 Osteophyte, left knee: Secondary | ICD-10-CM | POA: Diagnosis not present

## 2024-07-31 DIAGNOSIS — M79601 Pain in right arm: Secondary | ICD-10-CM | POA: Diagnosis not present

## 2024-07-31 DIAGNOSIS — Z6836 Body mass index (BMI) 36.0-36.9, adult: Secondary | ICD-10-CM | POA: Diagnosis not present

## 2024-07-31 DIAGNOSIS — F17210 Nicotine dependence, cigarettes, uncomplicated: Secondary | ICD-10-CM | POA: Diagnosis not present

## 2024-08-06 DIAGNOSIS — Z79899 Other long term (current) drug therapy: Secondary | ICD-10-CM | POA: Diagnosis not present

## 2024-08-10 ENCOUNTER — Ambulatory Visit: Admitting: Podiatry
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
## 2024-08-13 ENCOUNTER — Encounter: Payer: Self-pay | Admitting: Podiatry

## 2024-08-13 ENCOUNTER — Ambulatory Visit (INDEPENDENT_AMBULATORY_CARE_PROVIDER_SITE_OTHER): Admitting: Podiatry

## 2024-08-13 ENCOUNTER — Telehealth (HOSPITAL_COMMUNITY): Admitting: Psychiatry

## 2024-08-13 ENCOUNTER — Encounter (HOSPITAL_COMMUNITY): Payer: Self-pay | Admitting: Psychiatry

## 2024-08-13 DIAGNOSIS — L600 Ingrowing nail: Secondary | ICD-10-CM

## 2024-08-13 DIAGNOSIS — F331 Major depressive disorder, recurrent, moderate: Secondary | ICD-10-CM | POA: Diagnosis not present

## 2024-08-13 DIAGNOSIS — F4310 Post-traumatic stress disorder, unspecified: Secondary | ICD-10-CM | POA: Diagnosis not present

## 2024-08-13 DIAGNOSIS — B351 Tinea unguium: Secondary | ICD-10-CM

## 2024-08-13 MED ORDER — LAMOTRIGINE 100 MG PO TABS: 100.0000 mg | ORAL_TABLET | Freq: Every day | ORAL | 0 refills | Status: DC

## 2024-08-13 MED ORDER — LAMOTRIGINE 25 MG PO TABS: ORAL_TABLET | ORAL | 0 refills | Status: DC

## 2024-08-13 NOTE — Patient Instructions (Signed)

## 2024-08-17 NOTE — Progress Notes (Signed)
 Subjective:   Patient ID: Brianna Gutierrez, female   DOB: 48 y.o.   MRN: [id number]   HPI Chief Complaint  Patient presents with   Ingrown Toenail    Rm11 Ingrown right hallux/2 months with no treatment/diabetic A1C 6/ sore to touch and with pressure.    48 year old female presents the office today above concerns.  She states that the toenail does cause discomfort when she has pressure.  She does not report any drainage or pus coming from the area.  Review of Systems  All other systems reviewed and are negative.  Past Medical History:  Diagnosis Date   Abnormal Pap smear of cervix    Anxiety    Depression    Diabetes mellitus without complication (HCC)    Dysmenorrhea    Eczema    History of COVID-19 10/24/2020   loss of taste and smellbody aches and cough x 4 days all symptoms resolved   History of kidney stones    Hypertension    Pre-diabetes    PTSD (post-traumatic stress disorder)    Pulmonary embolus (HCC) 2022   post op from hysterectomy   Rash 08/31/2021   comes and goes on legs saw dermetology no rash now ? food allergy   Sleep apnea    SUI (stress urinary incontinence, female)    Trichomonas infection 02/2021   Uterine fibroid     Past Surgical History:  Procedure Laterality Date   BLADDER SUSPENSION N/A 09/11/2021   Procedure: TRANSVAGINAL TAPE (TVT) PROCEDURE;  Surgeon: Cathlyn JAYSON Nikki Bobie FORBES, MD;  Location: [facility] ;  Service: Gynecology;  Laterality: N/A;   CESAREAN SECTION  1997   x 1   CHOLECYSTECTOMY N/A 01/14/2019   Procedure: LAPAROSCOPIC CHOLECYSTECTOMY WITH POSSIBLE  INTRAOPERATIVE CHOLANGIOGRAM;  Surgeon: Eletha Boas, MD;  Location: [facility];  Service: General;  Laterality: N/A;   CYSTOSCOPY N/A 09/11/2021   Procedure: PHYLLIS;  Surgeon: Cathlyn JAYSON Nikki Bobie FORBES, MD;  Location: [facility];  Service: Gynecology;  Laterality: N/A;   CYSTOSCOPY/URETEROSCOPY/HOLMIUM LASER Left 01/31/2022   Procedure: LEFT  URETEROSCOPY/HOLMIUM LASER;  Surgeon: Lovie Arlyss CROME, MD;  Location: [facility];  Service: Urology;  Laterality: Left;   DILITATION & CURRETTAGE/HYSTROSCOPY WITH HYDROTHERMAL ABLATION N/A 06/22/2016   Procedure: DILATATION & CURETTAGE/HYSTEROSCOPY WITH HYDROTHERMAL ABLATION;  Surgeon: Carlin DELENA Centers, MD;  Location[facility];  Service: Gynecology;  Laterality: N/A;   TOTAL LAPAROSCOPIC HYSTERECTOMY WITH SALPINGECTOMY Bilateral 09/11/2021   Procedure: TOTAL LAPAROSCOPIC HYSTERECTOMY WITH SALPINGECTOMY;  Surgeon: Cathlyn JAYSON Nikki Bobie FORBES, MD;  Location: [facility];  Service: Gynecology;  Laterality: Bilateral;   TUBAL LIGATION     09-28-2015     Current Outpatient Medications:    albuterol  (VENTOLIN  HFA) 108 (90 Base) MCG/ACT inhaler, Inhale 2 puffs into the lungs every 4 (four) hours as needed for wheezing or shortness of breath (coughing fits)., Disp: 18 g, Rfl: 1   amLODipine  (NORVASC ) 10 MG tablet, Take 1 tablet (10 mg total) by mouth every evening., Disp: 90 tablet, Rfl: 0   budesonide -formoterol  (SYMBICORT ) 80-4.5 MCG/ACT inhaler, Inhale 2 puffs into the lungs daily. with spacer and rinse mouth afterwards., Disp: 1 each, Rfl: 3   EPINEPHrine  0.3 mg/0.3 mL IJ SOAJ injection, INJECT CONTENTS OF 1 PEN AS NEEDED FOR ALLERGIC REACTION, Disp: 2 each, Rfl: 2   famotidine  (PEPCID ) 20 MG tablet, 1 tablet at bedtime as needed Orally Once a day, Disp: , Rfl:    gabapentin  (NEURONTIN ) 300 MG capsule,  Take 1 capsule (300 mg total) by mouth at bedtime., Disp: 90 capsule, Rfl: 0   glipiZIDE (GLUCOTROL XL) 2.5 MG 24 hr tablet, Take 2.5 mg by mouth daily., Disp: , Rfl:    hydrochlorothiazide  (HYDRODIURIL ) 25 MG tablet, Take 1 tablet (25 mg total) by mouth daily., Disp: 90 tablet, Rfl: 0   lamoTRIgine  (LAMICTAL ) 100 MG tablet, Take 1 tablet (100 mg total) by mouth daily. Start in 4 weeks after take the lamotrigine  50 mg dose for 2 weeks, Disp: 90 tablet, Rfl: 0   lamoTRIgine  (LAMICTAL ) 25 MG  tablet, Take 1 tablet (25 mg total) by mouth daily for 14 days, THEN 2 tablets (50 mg total) daily for 14 days., Disp: 42 tablet, Rfl: 0   montelukast  (SINGULAIR ) 10 MG tablet, Take 10 mg by mouth at bedtime., Disp: , Rfl:    naloxone (NARCAN) nasal spray 4 mg/0.1 mL, SMARTSIG:Spray(s) Both Nares, Disp: , Rfl:    naloxone (NARCAN) nasal spray 4 mg/0.1 mL, SMARTSIG:Spray(s) Both Nares, Disp: , Rfl:    oxyCODONE  (OXY IR/ROXICODONE ) 5 MG immediate release tablet, Take 5 mg by mouth 4 (four) times daily as needed., Disp: , Rfl:    rosuvastatin (CRESTOR) 5 MG tablet, Take 5 mg by mouth every evening., Disp: , Rfl:    Vitamin D , Ergocalciferol , (DRISDOL) 1.25 MG (50000 UNIT) CAPS capsule, Take 50,000 Units by mouth once a week., Disp: , Rfl:    XOLAIR  150 MG/ML prefilled syringe, INJECT 300MG  SUBCUTANEOUSLY  EVERY 4 WEEKS, Disp: 2 mL, Rfl: 11  Current Facility-Administered Medications:    omalizumab  (XOLAIR ) prefilled syringe 300 mg, 300 mg, Subcutaneous, Q28 days, Cheryl Reusing, FNP, 300 mg at 07/22/24 1516  Allergies  Allergen Reactions   Porcine (Pork) Protein-Containing Drug Products    Tramadol      rash          Objective:  Physical Exam  General: AAO x3, NAD  Dermatological: Incurvation present to right hallux toenail.  There is no erythema or warmth minimal edema to the nail border.  The nail itself is hypertrophic, dystrophic with yellow, brown discoloration.  No open lesions.  Vascular: Dorsalis Pedis artery and Posterior Tibial artery pedal pulses are 2/4 bilateral with immedate capillary fill time. There is no pain with calf compression, swelling, warmth, erythema.   Neruologic: Grossly intact via light touch bilateral.   Musculoskeletal: Tenderness on the ingrown toenail.  No other areas of discomfort.  Gait: Unassisted, Nonantalgic.       Assessment:   48 year old female right hallux ingrown toenail, onychodystrophy     Plan:  -Treatment options discussed  including all alternatives, risks, and complications -Etiology of symptoms were discussed -At this time, the patient is requesting partial nail removal with chemical matricectomy to the symptomatic portion of the nail. Risks and complications were discussed with the patient for which they understand and written consent was obtained. Under sterile conditions a total of 3 mL of a mixture of 2% lidocaine  plain and 0.5% Marcaine  plain was infiltrated in a hallux block fashion. Once anesthetized, the skin was prepped in sterile fashion. A tourniquet was then applied. Next the symptomatic borders of the right hallux nail border was then sharply excised making sure to remove the entire offending nail border. Once the nails were ensured to be removed area was debrided and the underlying skin was intact. There is no purulence identified in the procedure. Next phenol was then applied under standard conditions and copiously irrigated.  Silvadene was applied. A dry sterile dressing  was applied. After application of the dressing the tourniquet was removed and there is found to be an immediate capillary refill time to the digit. The patient tolerated the procedure well any complications. Post procedure instructions were discussed the patient for which he verbally understood. Discussed signs/symptoms of infection and directed to call the office immediately should any occur or go directly to the emergency room. In the meantime, encouraged to call the office with any questions, concerns, changes symptoms.  Onychodystrophy -Nail sent for culture.  - Discussed treatment options will order the results of the culture before proceeding with further treatment to treat the overall toenail.  Return in about 2 weeks (around 08/27/2024), or if symptoms worsen or fail to improve, for nail check/culture results.  Donnice JONELLE Fees DPM

## 2024-08-20 ENCOUNTER — Other Ambulatory Visit: Payer: Self-pay | Admitting: Podiatry

## 2024-08-25 ENCOUNTER — Ambulatory Visit

## 2024-08-25 DIAGNOSIS — E119 Type 2 diabetes mellitus without complications: Secondary | ICD-10-CM | POA: Diagnosis not present

## 2024-08-25 DIAGNOSIS — M51361 Other intervertebral disc degeneration, lumbar region with lower extremity pain only: Secondary | ICD-10-CM | POA: Diagnosis not present

## 2024-08-25 DIAGNOSIS — F17210 Nicotine dependence, cigarettes, uncomplicated: Secondary | ICD-10-CM | POA: Diagnosis not present

## 2024-08-25 DIAGNOSIS — E6609 Other obesity due to excess calories: Secondary | ICD-10-CM | POA: Diagnosis not present

## 2024-08-25 DIAGNOSIS — E559 Vitamin D deficiency, unspecified: Secondary | ICD-10-CM | POA: Diagnosis not present

## 2024-08-25 DIAGNOSIS — E7800 Pure hypercholesterolemia, unspecified: Secondary | ICD-10-CM | POA: Diagnosis not present

## 2024-08-25 DIAGNOSIS — Z79899 Other long term (current) drug therapy: Secondary | ICD-10-CM | POA: Diagnosis not present

## 2024-08-25 DIAGNOSIS — L501 Idiopathic urticaria: Secondary | ICD-10-CM

## 2024-08-25 DIAGNOSIS — R5383 Other fatigue: Secondary | ICD-10-CM | POA: Diagnosis not present

## 2024-08-25 DIAGNOSIS — D539 Nutritional anemia, unspecified: Secondary | ICD-10-CM | POA: Diagnosis not present

## 2024-08-26 ENCOUNTER — Ambulatory Visit: Payer: Self-pay | Admitting: Podiatry

## 2024-08-28 ENCOUNTER — Ambulatory Visit: Admitting: Podiatry

## 2024-08-31 ENCOUNTER — Encounter: Payer: Self-pay | Admitting: Radiology

## 2024-09-03 ENCOUNTER — Other Ambulatory Visit: Payer: Self-pay | Admitting: Allergy

## 2024-09-05 ENCOUNTER — Other Ambulatory Visit (HOSPITAL_COMMUNITY): Payer: Self-pay | Admitting: Psychiatry

## 2024-09-05 DIAGNOSIS — F4310 Post-traumatic stress disorder, unspecified: Secondary | ICD-10-CM

## 2024-09-05 DIAGNOSIS — F331 Major depressive disorder, recurrent, moderate: Secondary | ICD-10-CM

## 2024-09-15 ENCOUNTER — Encounter (HOSPITAL_COMMUNITY): Payer: Self-pay | Admitting: *Deleted

## 2024-09-16 ENCOUNTER — Other Ambulatory Visit: Payer: Self-pay

## 2024-09-16 ENCOUNTER — Encounter: Payer: Self-pay | Admitting: Allergy

## 2024-09-16 ENCOUNTER — Ambulatory Visit (INDEPENDENT_AMBULATORY_CARE_PROVIDER_SITE_OTHER): Admitting: Allergy

## 2024-09-16 VITALS — BP 132/100 | HR 96 | Temp 98.0°F | Resp 14 | Ht 62.0 in | Wt 207.0 lb

## 2024-09-16 DIAGNOSIS — R0609 Other forms of dyspnea: Secondary | ICD-10-CM | POA: Diagnosis not present

## 2024-09-16 DIAGNOSIS — L501 Idiopathic urticaria: Secondary | ICD-10-CM | POA: Diagnosis not present

## 2024-09-16 DIAGNOSIS — J3081 Allergic rhinitis due to animal (cat) (dog) hair and dander: Secondary | ICD-10-CM

## 2024-09-16 DIAGNOSIS — J3089 Other allergic rhinitis: Secondary | ICD-10-CM

## 2024-09-16 DIAGNOSIS — T7819XD Other adverse food reactions, not elsewhere classified, subsequent encounter: Secondary | ICD-10-CM

## 2024-09-16 DIAGNOSIS — K219 Gastro-esophageal reflux disease without esophagitis: Secondary | ICD-10-CM

## 2024-09-16 MED ORDER — LEVOCETIRIZINE DIHYDROCHLORIDE 5 MG PO TABS: 5.0000 mg | ORAL_TABLET | Freq: Every evening | ORAL | 5 refills | Status: AC

## 2024-09-16 MED ORDER — BUDESONIDE-FORMOTEROL FUMARATE 160-4.5 MCG/ACT IN AERO: 2.0000 | INHALATION_SPRAY | Freq: Two times a day (BID) | RESPIRATORY_TRACT | 5 refills | Status: AC

## 2024-09-16 NOTE — Patient Instructions (Addendum)
 Breathing Daily controller medication(s): Increase Symbicort  160mcg 2 puffs to TWICE a day with spacer and rinse mouth afterwards. May use albuterol  rescue inhaler 2 puffs or nebulizer every 4 to 6 hours as needed for shortness of breath, chest tightness, coughing, and wheezing. May use albuterol  rescue inhaler 2 puffs 5 to 15 minutes prior to strenuous physical activities. Monitor frequency of use - if you need to use it more than twice per week on a consistent basis let us  know.  Breathing control goals:  Full participation in all desired activities (may need albuterol  before activity) Albuterol  use two times or less a week on average (not counting use with activity) Cough interfering with sleep two times or less a month Oral steroids no more than once a year No hospitalizations   Do NOT use the Epipen  only for symptoms for itching.  For mild symptoms you can take over the counter antihistamines (zyrtec  10mg  to 20mg ) and monitor symptoms closely.  If symptoms worsen or if you have severe symptoms including breathing issues, throat closure, significant swelling, whole body hives, severe diarrhea and vomiting, lightheadedness then use epinephrine  and seek immediate medical care afterwards. Emergency action plan in place.   Skin: Continue proper skin care. Keep track of flares. Continue to avoid pork, lamb and beef jerky, cherry.  During hive flare start the following medications until symptoms subside. Allegra  180mg  1-2 times per day. Famotidine  20mg  twice a day. Avoid the following potential triggers: alcohol , tight clothing, NSAIDs, hot showers and getting overheated. Xolair  300mg  every 5 weeks.  Environmental allergies 2023 skin testing positive to mold and dog.  Continue environmental control measures.  Use over the counter antihistamines such as Zyrtec  (cetirizine ), Claritin  (loratadine ), Allegra  (fexofenadine ), or Xyzal  (levocetirizine) daily as needed.   GERD Continue  famotidine  20mg  twice a day.    Follow up in 3 months or sooner if needed.

## 2024-09-16 NOTE — Progress Notes (Signed)
 Follow Up Note  RE: Brianna Gutierrez MRN: [id number] DOB: September 27, 1976 Date of Office Visit: 09/16/2024  Referring provider: Jaycee Greig PARAS, NP Primary care provider: Jerome Heron Ruth, PA-C  Chief Complaint: Follow-up  History of Present Illness: I had the pleasure of seeing Brianna Gutierrez for a follow up visit at the [facility] on 09/16/2024. She is a 48 y.o. 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 Her previous allergy office visit was on 06/17/2024 with Dr. Luke. Today is a regular follow up visit.  Discussed the use of AI scribe software for clinical note transcription with the patient, who gave verbal consent to proceed.    She has been receiving Xolair  injections every five weeks for her chronic hives. No itching or impending breakouts have occurred since starting this regimen. She has received two doses of the five-week interval injections so far and has not experienced any side effects. She avoids pork, lamb, beef jerky, and cherries but can consume beef without any issues. No recent flares have required the use of Allegra  or famotidine .  She experiences occasional shortness of breath at work, particularly when walking, which is relieved by using her albuterol  inhaler. She is currently using Symbicort  , two puffs twice a day, but notes that the increased dose has not significantly improved her symptoms. She has not needed to use her nebulizer and continues to use a CPAP machine at night.  She has been experiencing increased sneezing, possibly due to seasonal changes. She does not currently have any allergy medication at home.  She mentions a recent fall at home while climbing on a table, resulting in an arm injury.   Assessment and Plan: [name] is a 48 y.o. female with: Chronic idiopathic urticaria Past history - Pruritic rash for 1 year  lasting 1 day at a time. No triggers noted. Occurs a few times per month. Concerned whether strawberries, bananas and seafood may be contributing to the symptoms. Saw dermatology virtually in the past with no benefit.  Interim history - well controlled with Xolair . Continue proper skin care. Keep track of flares. Continue to avoid pork, lamb and beef jerky, cherry.  During hive flare start the following medications until symptoms subside. Allegra  180mg  1-2 times per day. Famotidine  20mg  twice a day. Avoid the following potential triggers: alcohol , tight clothing, NSAIDs, hot showers and getting overheated. Xolair  300mg  every 5 weeks.   Dyspnea on exertion Past history - Dyspnea on exertion and uses albuterol  as needed with good benefit. Has been using it daily since prescribed 1 month ago. 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 CT chest in April 2023 was unremarkable. 2023 spirometry showed restriction with 2% improvement FEV1 postbronchodilator treatment. Clinically feeling improved  Interim history - unsure if this is due to physical decondition but albuterol  helps.  Today's spirometry was normal.  Daily controller medication(s): Increase Symbicort  160mcg 2 puffs to TWICE a day with spacer and rinse mouth afterwards. May use albuterol  rescue inhaler 2 puffs or nebulizer every 4 to 6 hours as needed for shortness of breath, chest tightness, coughing, and wheezing. May use albuterol  rescue inhaler 2 puffs 5 to 15 minutes prior to strenuous physical activities. Monitor frequency of use - if you need to use it more than twice per week on a consistent basis let us  know.  Get spirometry at next visit. If no improvement  will stop Symbicort .    Other adverse food reactions, not elsewhere classified, subsequent encounter Past history - 2024 bloodwork positive to pork and lamb. Tolerates bananas and strawberries.  Interim history - tolerates beef.  Continue to avoid pork,  lamb and beef jerky and cherry.   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  Interim history - some sneezing.  Continue environmental control measures.  Use over the counter antihistamines such as Zyrtec  (cetirizine ), Claritin  (loratadine ), Allegra  (fexofenadine ), or Xyzal  (levocetirizine) daily as needed.    GERD Continue famotidine  20mg  twice a day.    Return in about 3 months (around 12/17/2024).  Meds ordered this encounter  Medications   budesonide -formoterol  (SYMBICORT ) 160-4.5 MCG/ACT inhaler    Sig: Inhale 2 puffs into the lungs in the morning and at bedtime. with spacer and rinse mouth afterwards.    Dispense:  1 each    Refill:  5   levocetirizine (XYZAL ) 5 MG tablet    Sig: Take 1 tablet (5 mg total) by mouth every evening.    Dispense:  30 tablet    Refill:  5   Lab Orders  No laboratory test(s) ordered today    Diagnostics: Spirometry:  Tracings reviewed. Her effort: Good reproducible efforts. FVC: 1.92L FEV1: 1.54L, 68% predicted FEV1/FVC ratio: 80% Interpretation: Spirometry consistent with normal pattern.  Please see scanned spirometry results for details.  Results discussed with patient/family.   Medication List:  Current Outpatient Medications  Medication Sig Dispense Refill   albuterol  (VENTOLIN  HFA) 108 (90 Base) MCG/ACT inhaler Inhale 2 puffs into the lungs every 4 (four) hours as needed for wheezing or shortness of breath (coughing fits). 18 g 1   amLODipine  (NORVASC ) 10 MG tablet Take 1 tablet (10 mg total) by mouth every evening. 90 tablet 0   budesonide -formoterol  (SYMBICORT ) 160-4.5 MCG/ACT inhaler Inhale 2 puffs into the lungs in the morning and at bedtime. with spacer and rinse mouth afterwards. 1 each 5   EPINEPHrine  0.3 mg/0.3 mL IJ SOAJ injection INJECT CONTENTS OF 1 PEN AS NEEDED FOR ALLERGIC REACTION 2 each 2   famotidine  (PEPCID ) 20 MG tablet 1  tablet at bedtime as needed Orally Once a day     gabapentin  (NEURONTIN ) 300 MG capsule Take 1 capsule (300 mg total) by mouth at bedtime. 90 capsule 0   glipiZIDE (GLUCOTROL XL) 2.5 MG 24 hr tablet Take 2.5 mg by mouth daily.     hydrochlorothiazide  (HYDRODIURIL ) 25 MG tablet Take 1 tablet (25 mg total) by mouth daily. 90 tablet 0   lamoTRIgine  (LAMICTAL ) 100 MG tablet Take 1 tablet (100 mg total) by mouth daily. Start in 4 weeks after take the lamotrigine  50 mg dose for 2 weeks 90 tablet 0   levocetirizine (XYZAL ) 5 MG tablet Take 1 tablet (5 mg total) by mouth every evening. 30 tablet 5   montelukast  (SINGULAIR ) 10 MG tablet Take 10 mg by mouth at bedtime.     naloxone (NARCAN) nasal spray 4 mg/0.1 mL SMARTSIG:Spray(s) Both Nares     naloxone (NARCAN) nasal spray 4 mg/0.1 mL SMARTSIG:Spray(s) Both Nares     oxyCODONE  (OXY IR/ROXICODONE ) 5 MG immediate release tablet Take 5 mg by mouth 4 (four) times daily as needed.     rosuvastatin (CRESTOR) 5 MG tablet Take 5 mg by mouth every evening.     Vitamin D , Ergocalciferol , (DRISDOL) 1.25 MG (50000 UNIT) CAPS capsule Take 50,000  Units by mouth once a week.     XOLAIR  150 MG/ML prefilled syringe INJECT 300MG  SUBCUTANEOUSLY  EVERY 4 WEEKS 2 mL 11   lamoTRIgine  (LAMICTAL ) 25 MG tablet Take 1 tablet (25 mg total) by mouth daily for 14 days, THEN 2 tablets (50 mg total) daily for 14 days. (Patient not taking: No sig reported) 42 tablet 0   Current Facility-Administered Medications  Medication Dose Route Frequency Provider Last Rate Last Admin   omalizumab  (XOLAIR ) prefilled syringe 300 mg  300 mg Subcutaneous Q28 days Cheryl Reusing, FNP   300 mg at 08/25/24 1400   Allergies: Allergies  Allergen Reactions   Porcine (Pork) Protein-Containing Drug Products    Tramadol      rash   I reviewed her past medical history, social history, family history, and environmental history and no significant changes have been reported from her previous  visit.  Review of Systems  Constitutional:  Negative for appetite change, chills, fever and unexpected weight change.  HENT:  Positive for sneezing. Negative for congestion and rhinorrhea.   Eyes:  Negative for itching.  Respiratory:  Positive for shortness of breath. Negative for cough, chest tightness and wheezing.   Cardiovascular:  Negative for chest pain.  Gastrointestinal:  Negative for abdominal pain.  Genitourinary:  Negative for difficulty urinating.  Skin:  Negative for rash.  Allergic/Immunologic: Positive for environmental allergies.  Neurological:  Negative for headaches.    Objective: BP (!) 132/100 (BP Location: Left Arm, Patient Position: Sitting, Cuff Size: Normal)   Pulse 96   Temp 98 F (36.7 C) (Temporal)   Resp 14   Ht 5' 2 (1.575 m)   Wt 207 lb (93.9 kg)   LMP  (LMP Unknown)   SpO2 94%   BMI 37.86 kg/m  Body mass index is 37.86 kg/m. Physical Exam Vitals and nursing note reviewed.  Constitutional:      Appearance: Normal appearance. She is well-developed. She is obese.  HENT:     Head: Normocephalic and atraumatic.     Right Ear: Tympanic membrane and external ear normal.     Left Ear: Tympanic membrane and external ear normal.     Nose: Nose normal.     Mouth/Throat:     Mouth: Mucous membranes are moist.  Eyes:     Conjunctiva/sclera: Conjunctivae normal.  Cardiovascular:     Rate and Rhythm: Normal rate and regular rhythm.     Heart sounds: Normal heart sounds. No murmur heard.    No friction rub. No gallop.  Pulmonary:     Effort: Pulmonary effort is normal.     Breath sounds: Normal breath sounds. No wheezing, rhonchi or rales.  Musculoskeletal:     Cervical back: Neck supple.  Skin:    General: Skin is warm.     Findings: No rash.  Neurological:     Mental Status: She is alert and oriented to person, place, and time.  Psychiatric:        Behavior: Behavior normal.    Previous notes and tests were reviewed. The plan was reviewed  with the patient/family, and all questions/concerned were addressed.  It was my pleasure to see Shekira today and participate in her care. Please feel free to contact me with any questions or concerns.  Sincerely,  Orlan Cramp, DO Allergy & Immunology  [facility]  [facility] office: [phone] [facility] office: [phone]

## 2024-09-23 DIAGNOSIS — G4733 Obstructive sleep apnea (adult) (pediatric): Secondary | ICD-10-CM | POA: Diagnosis not present

## 2024-09-25 DIAGNOSIS — E559 Vitamin D deficiency, unspecified: Secondary | ICD-10-CM | POA: Diagnosis not present

## 2024-09-25 DIAGNOSIS — R519 Headache, unspecified: Secondary | ICD-10-CM | POA: Diagnosis not present

## 2024-09-25 DIAGNOSIS — E7800 Pure hypercholesterolemia, unspecified: Secondary | ICD-10-CM | POA: Diagnosis not present

## 2024-09-25 DIAGNOSIS — I10 Essential (primary) hypertension: Secondary | ICD-10-CM | POA: Diagnosis not present

## 2024-09-25 DIAGNOSIS — E119 Type 2 diabetes mellitus without complications: Secondary | ICD-10-CM | POA: Diagnosis not present

## 2024-09-25 DIAGNOSIS — E6609 Other obesity due to excess calories: Secondary | ICD-10-CM | POA: Diagnosis not present

## 2024-09-25 DIAGNOSIS — F17210 Nicotine dependence, cigarettes, uncomplicated: Secondary | ICD-10-CM | POA: Diagnosis not present

## 2024-09-25 DIAGNOSIS — M51361 Other intervertebral disc degeneration, lumbar region with lower extremity pain only: Secondary | ICD-10-CM | POA: Diagnosis not present

## 2024-09-25 DIAGNOSIS — Z79899 Other long term (current) drug therapy: Secondary | ICD-10-CM | POA: Diagnosis not present

## 2024-09-29 ENCOUNTER — Ambulatory Visit

## 2024-09-29 DIAGNOSIS — L501 Idiopathic urticaria: Secondary | ICD-10-CM | POA: Diagnosis not present

## 2024-09-30 DIAGNOSIS — Z79899 Other long term (current) drug therapy: Secondary | ICD-10-CM | POA: Diagnosis not present

## 2024-10-10 DIAGNOSIS — E1129 Type 2 diabetes mellitus with other diabetic kidney complication: Secondary | ICD-10-CM | POA: Diagnosis not present

## 2024-10-15 ENCOUNTER — Telehealth: Payer: Self-pay

## 2024-10-15 NOTE — Telephone Encounter (Signed)
 Pharmacy called to confirm shipment of pt Xolair  schedule for Tuesday 10/20/24.

## 2024-10-24 DIAGNOSIS — E559 Vitamin D deficiency, unspecified: Secondary | ICD-10-CM | POA: Diagnosis not present

## 2024-10-24 DIAGNOSIS — E6609 Other obesity due to excess calories: Secondary | ICD-10-CM | POA: Diagnosis not present

## 2024-10-24 DIAGNOSIS — I10 Essential (primary) hypertension: Secondary | ICD-10-CM | POA: Diagnosis not present

## 2024-10-24 DIAGNOSIS — F17210 Nicotine dependence, cigarettes, uncomplicated: Secondary | ICD-10-CM | POA: Diagnosis not present

## 2024-10-24 DIAGNOSIS — E119 Type 2 diabetes mellitus without complications: Secondary | ICD-10-CM | POA: Diagnosis not present

## 2024-10-24 DIAGNOSIS — Z79899 Other long term (current) drug therapy: Secondary | ICD-10-CM | POA: Diagnosis not present

## 2024-10-24 DIAGNOSIS — E7800 Pure hypercholesterolemia, unspecified: Secondary | ICD-10-CM | POA: Diagnosis not present

## 2024-10-27 ENCOUNTER — Ambulatory Visit: Admission: EM | Admit: 2024-10-27 | Discharge: 2024-10-27 | Disposition: A | Discharge status: Home / Self Care

## 2024-10-27 ENCOUNTER — Encounter: Payer: Self-pay | Admitting: Emergency Medicine

## 2024-10-27 DIAGNOSIS — J069 Acute upper respiratory infection, unspecified: Secondary | ICD-10-CM | POA: Diagnosis not present

## 2024-10-27 DIAGNOSIS — R051 Acute cough: Secondary | ICD-10-CM | POA: Diagnosis not present

## 2024-10-27 LAB — POCT INFLUENZA A/B
Influenza A, POC: NEGATIVE
Influenza B, POC: NEGATIVE

## 2024-10-27 MED ORDER — PROMETHAZINE-DM 6.25-15 MG/5ML PO SYRP: 5.0000 mL | ORAL_SOLUTION | Freq: Four times a day (QID) | ORAL | 0 refills | Status: AC | PRN

## 2024-10-27 NOTE — ED Triage Notes (Signed)
 Pt presents c/o URI x 2 days. Pt states,  I been sick since yesterday. I been coughing, weak, my stomach has been hurting, my appetite is gone, and I'm sneezing like crazy.  Pt denies emesis and diarrhea.

## 2024-10-27 NOTE — ED Provider Notes (Signed)
 " [name]    CSN: [id number] Arrival date & time: 10/27/24  0944      History   Chief Complaint Chief Complaint  Patient presents with   URI    HPI Brianna Gutierrez is a 48 y.o. female.   Patient reports the new onset of nasal congestion, chills, headache, sore throat, cough, and diffuse abdominal pain that started yesterday.  No reported fever, vomiting, or diarrhea.  She endorses shortness of breath with the cough but no overt chest pain.  She took some Mucinex and used Chloraseptic spray for her throat.  Did not receive the flu shot this year.  The history is provided by the patient.  URI Presenting symptoms: congestion, cough, fatigue and sore throat   Presenting symptoms: no fever   Associated symptoms: headaches   Associated symptoms: no myalgias and no sneezing     Past Medical History:  Diagnosis Date   Abnormal Pap smear of cervix    Anxiety    Depression    Diabetes mellitus without complication (HCC)    Dysmenorrhea    Eczema    History of COVID-19 10/24/2020   loss of taste and smellbody aches and cough x 4 days all symptoms resolved   History of kidney stones    Hypertension    Pre-diabetes    PTSD (post-traumatic stress disorder)    Pulmonary embolus (HCC) 2022   post op from hysterectomy   Rash 08/31/2021   comes and goes on legs saw dermetology no rash now ? food allergy   Sleep apnea    SUI (stress urinary incontinence, female)    Trichomonas infection 02/2021   Uterine fibroid     Patient Active Problem List   Diagnosis Date Noted   Daytime somnolence 05/14/2024   Rash and other nonspecific skin eruption 05/08/2023   Lumbar radiculopathy 02/26/2023   Low back pain 02/11/2023   Neuralgia of left sciatic nerve 02/11/2023   Other adverse food reactions, not elsewhere classified, subsequent encounter 09/19/2022   Pruritus 08/15/2022   Essential hypertension 04/30/2022   Shortness of breath 04/19/2022   Other allergic  rhinitis 04/19/2022   Chronic idiopathic urticaria 04/19/2022   Pulmonary embolism (HCC) 11/30/2021   Depression 11/15/2021   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   Smoking 10/26/2021   Postoperative intra-abdominal abscess [facility])    Medication monitoring encounter    Pelvic infection in female 09/24/2021   Status post laparoscopic hysterectomy 09/11/2021   Cholelithiasis 01/13/2019   Pre-diabetes 11/15/2016   History of bilateral tubal ligation 09/28/2015   Tobacco abuse 09/28/2015    Past Surgical History:  Procedure Laterality Date   BLADDER SUSPENSION N/A 09/11/2021   Procedure: TRANSVAGINAL TAPE (TVT) PROCEDURE;  Surgeon: Cathlyn JAYSON Nikki Bobie FORBES, MD;  Location: [facility] [place];  Service: Gynecology;  Laterality: N/A;   CESAREAN SECTION  1997   x 1   CHOLECYSTECTOMY N/A 01/14/2019   Procedure: LAPAROSCOPIC CHOLECYSTECTOMY WITH POSSIBLE  INTRAOPERATIVE CHOLANGIOGRAM;  Surgeon: Eletha Boas, MD;  Location: [facility];  Service: General;  Laterality: N/A;   CYSTOSCOPY N/A 09/11/2021   Procedure: PHYLLIS;  Surgeon: Cathlyn JAYSON Nikki Bobie FORBES, MD;  Location: [facility];  Service: Gynecology;  Laterality: N/A;   CYSTOSCOPY/URETEROSCOPY/HOLMIUM LASER Left 01/31/2022   Procedure: LEFT URETEROSCOPY/HOLMIUM LASER;  Surgeon: Lovie Arlyss CROME, MD;  Location: [facility];  Service: Urology;  Laterality: Left;   DILITATION & CURRETTAGE/HYSTROSCOPY WITH HYDROTHERMAL ABLATION N/A 06/22/2016   Procedure: DILATATION &  CURETTAGE/HYSTEROSCOPY WITH HYDROTHERMAL ABLATION;  Surgeon: Carlin DELENA Centers, MD;  Location: [facility];  Service: Gynecology;  Laterality: N/A;   TOTAL LAPAROSCOPIC HYSTERECTOMY WITH SALPINGECTOMY Bilateral 09/11/2021   Procedure: TOTAL LAPAROSCOPIC HYSTERECTOMY WITH SALPINGECTOMY;  Surgeon: Cathlyn JAYSON Nikki Bobie FORBES, MD;  Location: [facility];  Service: Gynecology;  Laterality: Bilateral;   TUBAL LIGATION     09-28-2015    OB History      Gravida  4   Para  0   Term  0   Preterm  0   AB  0   Living  4      SAB  0   IAB  0   Ectopic  0   Multiple      Live Births  4            Home Medications    Prior to Admission medications  Medication Sig Start Date End Date Taking? Authorizing Provider  cyclobenzaprine  (FLEXERIL ) 10 MG tablet TAKE 1 TABLET BY MOUTH TWICE DAILY AS NEEDED FOR MUSCLE SPASM 08/07/23  Yes [provider]  lidocaine  (XYLOCAINE ) 2 % solution SMARTSIG:15 Milliliter(s) By Mouth Every 3 Hours PRN 05/28/24  Yes [provider]  meloxicam  (MOBIC ) 7.5 MG tablet Take 7.5 mg by mouth daily. 10/24/24  Yes [provider]  OZEMPIC, 0.25 OR 0.5 MG/DOSE, 2 MG/3ML SOPN Inject 0.5 mg into the skin once a week. 10/24/24  Yes [provider]  pregabalin (LYRICA) 50 MG capsule Take 50 mg by mouth 3 (three) times daily. 10/24/24  Yes [provider]  promethazine -dextromethorphan (PROMETHAZINE -DM) 6.25-15 MG/5ML syrup Take 5 mLs by mouth every 6 (six) hours as needed for cough. 10/27/24  Yes Janet Therisa PARAS, FNP  albuterol  (VENTOLIN  HFA) 108 (90 Base) MCG/ACT inhaler Inhale 2 puffs into the lungs every 4 (four) hours as needed for wheezing or shortness of breath (coughing fits). 05/06/24   Luke Orlan HERO, DO  amLODipine  (NORVASC ) 10 MG tablet Take 1 tablet (10 mg total) by mouth every evening. 01/08/24 09/16/24  Jaycee Greig PARAS, NP  budesonide -formoterol  (SYMBICORT ) 160-4.5 MCG/ACT inhaler Inhale 2 puffs into the lungs in the morning and at bedtime. with spacer and rinse mouth afterwards. 09/16/24   Luke Orlan HERO, DO  EPINEPHrine  0.3 mg/0.3 mL IJ SOAJ injection INJECT CONTENTS OF 1 PEN AS NEEDED FOR ALLERGIC REACTION 04/13/24   Jaycee Greig PARAS, NP  famotidine  (PEPCID ) 20 MG tablet 1 tablet at bedtime as needed Orally Once a day    [provider]  gabapentin  (NEURONTIN ) 300 MG capsule Take 1 capsule (300 mg total) by mouth at bedtime. 01/08/24   Jaycee Greig PARAS, NP   glipiZIDE (GLUCOTROL XL) 2.5 MG 24 hr tablet Take 2.5 mg by mouth daily. 04/13/24   [provider]  hydrochlorothiazide  (HYDRODIURIL ) 25 MG tablet Take 1 tablet (25 mg total) by mouth daily. 01/08/24 09/16/24  Jaycee Greig PARAS, NP  lamoTRIgine  (LAMICTAL ) 100 MG tablet Take 1 tablet (100 mg total) by mouth daily. Start in 4 weeks after take the lamotrigine  50 mg dose for 2 weeks 08/13/24 11/11/24  Carvin Arvella HERO, MD  lamoTRIgine  (LAMICTAL ) 25 MG tablet Take 1 tablet (25 mg total) by mouth daily for 14 days, THEN 2 tablets (50 mg total) daily for 14 days. Patient not taking: No sig reported 08/13/24 09/10/24  Carvin Arvella HERO, MD  levocetirizine (XYZAL ) 5 MG tablet Take 1 tablet (5 mg total) by mouth every evening. 09/16/24   Luke Orlan HERO,  DO  montelukast  (SINGULAIR ) 10 MG tablet Take 10 mg by mouth at bedtime. 02/23/24   [provider]  naloxone [facility]) nasal spray 4 mg/0.1 mL SMARTSIG:Spray(s) Both Nares 04/29/24   [provider]  naloxone [facility]) nasal spray 4 mg/0.1 mL SMARTSIG:Spray(s) Both Nares 02/29/24   [provider]  oxyCODONE  (OXY IR/ROXICODONE ) 5 MG immediate release tablet Take 5 mg by mouth 4 (four) times daily as needed. 04/25/24   [provider]  rosuvastatin (CRESTOR) 5 MG tablet Take 5 mg by mouth every evening. 03/31/24   [provider]  Vitamin D , Ergocalciferol , (DRISDOL) 1.25 MG (50000 UNIT) CAPS capsule Take 50,000 Units by mouth once a week. 03/30/24   [provider]  XOLAIR  150 MG/ML prefilled syringe INJECT 300MG  SUBCUTANEOUSLY  EVERY 4 WEEKS 09/03/24   Luke Orlan HERO, DO  dicyclomine  (BENTYL ) 20 MG tablet Take 1 tablet (20 mg total) by mouth 2 (two) times daily. 10/29/19 08/24/20  Babara Greig GAILS, PA-C  omeprazole  (PRILOSEC) 20 MG capsule Take 1 capsule (20 mg total) by mouth daily. 01/01/19 08/07/19  Arloa Suzen RAMAN, NP    Family History Family History  Problem Relation Age of Onset   Colon polyps Sister    Hypertension  Maternal Grandmother    Diabetes Maternal Grandfather    Allergic rhinitis Daughter    Allergic rhinitis Daughter    Allergic rhinitis Son    Allergic rhinitis Son    Colon cancer Neg Hx    Esophageal cancer Neg Hx    Rectal cancer Neg Hx    Stomach cancer Neg Hx     Social History Social History[1]   Allergies   Porcine (pork) protein-containing drug products and Tramadol    Review of Systems Review of Systems  Constitutional:  Positive for chills and fatigue. Negative for fever.  HENT:  Positive for congestion and sore throat. Negative for sneezing.   Eyes:  Negative for pain and redness.  Respiratory:  Positive for cough. Negative for shortness of breath.   Cardiovascular:  Negative for chest pain.  Gastrointestinal:  Positive for abdominal pain. Negative for diarrhea and vomiting.  Genitourinary:  Negative for decreased urine volume and dysuria.  Musculoskeletal:  Negative for back pain and myalgias.  Skin:  Negative for rash.  Neurological:  Positive for headaches. Negative for dizziness.  Psychiatric/Behavioral:  Negative for sleep disturbance.      Physical Exam Triage Vital Signs ED Triage Vitals  Encounter Vitals Group     BP 10/27/24 1056 135/83     Girls Systolic BP Percentile --      Girls Diastolic BP Percentile --      Boys Systolic BP Percentile --      Boys Diastolic BP Percentile --      Pulse Rate 10/27/24 1056 (!) 102     Resp 10/27/24 1056 20     Temp 10/27/24 1056 98.2 F (36.8 C)     Temp Source 10/27/24 1056 Oral     SpO2 10/27/24 1056 96 %     Weight 10/27/24 1055 207 lb 0.2 oz (93.9 kg)     Height --      Head Circumference --      Peak Flow --      Pain Score 10/27/24 1054 0     Pain Loc --      Pain Education --      Exclude from Growth Chart --    No data found.  Updated Vital Signs BP 135/83 (  BP Location: Left Arm)   Pulse (!) 102   Temp 98.2 F (36.8 C) (Oral)   Resp 20   Wt 207 lb 0.2 oz (93.9 kg)   LMP  (LMP Unknown)    SpO2 96%   BMI 37.86 kg/m   Visual Acuity Right Eye Distance:   Left Eye Distance:   Bilateral Distance:    Right Eye Near:   Left Eye Near:    Bilateral Near:     Physical Exam Vitals and nursing note reviewed.  Constitutional:      Appearance: Normal appearance.  HENT:     Head: Normocephalic.     Right Ear: Tympanic membrane and ear canal normal.     Left Ear: Tympanic membrane and ear canal normal.     Nose: Congestion present.     Mouth/Throat:     Pharynx: Posterior oropharyngeal erythema present. No oropharyngeal exudate.  Eyes:     Extraocular Movements: Extraocular movements intact.     Conjunctiva/sclera: Conjunctivae normal.     Pupils: Pupils are equal, round, and reactive to light.  Cardiovascular:     Rate and Rhythm: Normal rate and regular rhythm.     Heart sounds: Normal heart sounds. No murmur heard. Pulmonary:     Effort: Pulmonary effort is normal.     Breath sounds: Normal breath sounds. No wheezing, rhonchi or rales.  Abdominal:     General: Bowel sounds are normal.  Skin:    General: Skin is warm and dry.  Neurological:     General: No focal deficit present.     Mental Status: She is alert and oriented to person, place, and time.  Psychiatric:        Mood and Affect: Mood normal.        Behavior: Behavior normal.        Thought Content: Thought content normal.        Judgment: Judgment normal.      UC Treatments / Results  Labs (all labs ordered are listed, but only abnormal results are displayed) Labs Reviewed  POCT INFLUENZA A/B    EKG   Radiology No results found.  Procedures Procedures (including critical care time)  Medications Ordered in UC Medications - No data to display  Initial Impression / Assessment and Plan / UC Course  I have reviewed the triage vital signs and the nursing notes.  Pertinent labs & imaging results that were available during my care of the patient were reviewed by me and considered in my  medical decision making (see chart for details).    Patient reports URI/Viral symptoms for the past two days.  No overt chest pain, shortness of breath, vomiting, or diarrhea.  Her physical exam and vital signs are reassuring. Negative Influenza A/B testing.  Discussed symptom management with hydration, rest, and OTC medications such as tylenol , motrin , dayquil, and theraflu.  Prescription for promethazine  DM provided to help alleviate the cough.  Return for evaluation should symptoms persist or worsen.  Final Clinical Impressions(s) / UC Diagnoses   Final diagnoses:  Acute upper respiratory infection  Acute cough     Discharge Instructions      Negative Influenza A/B testing. May use OTC medications such as tylenol /motrin , dayquil, theraflu for symptom management. Ensure adequate rest and oral hydration Prescription for cough medication sent to the pharmacy Return for evaluation if your symptoms persist or worsen     ED Prescriptions     Medication Sig Dispense Auth. Provider   promethazine -dextromethorphan (  PROMETHAZINE -DM) 6.25-15 MG/5ML syrup Take 5 mLs by mouth every 6 (six) hours as needed for cough. 118 mL Janet Therisa PARAS, FNP      PDMP not reviewed this encounter.    [1]  Social History Tobacco Use   Smoking status: Every Day    Current packs/day: 0.50    Average packs/day: 0.5 packs/day for 20.0 years (10.0 ttl pk-yrs)    Types: Cigarettes    Passive exposure: Never   Smokeless tobacco: Never  Vaping Use   Vaping status: Former   Substances: Nicotine   Substance Use Topics   Alcohol  use: Not Currently   Drug use: Never     Janet Therisa PARAS, FNP 10/27/24 1215  "

## 2024-10-27 NOTE — Discharge Instructions (Addendum)
 Negative Influenza A/B testing. May use OTC medications such as tylenol /motrin , dayquil, theraflu for symptom management. Ensure adequate rest and oral hydration Prescription for cough medication sent to the pharmacy Return for evaluation if your symptoms persist or worsen

## 2024-10-30 ENCOUNTER — Ambulatory Visit: Admitting: Podiatry

## 2024-11-03 ENCOUNTER — Ambulatory Visit

## 2024-11-03 DIAGNOSIS — L501 Idiopathic urticaria: Secondary | ICD-10-CM
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
## 2024-11-12 ENCOUNTER — Telehealth (HOSPITAL_COMMUNITY): Admitting: Psychiatry

## 2024-11-12 ENCOUNTER — Encounter (HOSPITAL_COMMUNITY): Payer: Self-pay | Admitting: Psychiatry

## 2024-11-12 DIAGNOSIS — F4310 Post-traumatic stress disorder, unspecified: Secondary | ICD-10-CM

## 2024-11-12 DIAGNOSIS — F331 Major depressive disorder, recurrent, moderate: Secondary | ICD-10-CM | POA: Diagnosis not present

## 2024-11-12 MED ORDER — LAMOTRIGINE 100 MG PO TABS: 100.0000 mg | ORAL_TABLET | Freq: Every day | ORAL | 0 refills | Status: AC

## 2024-11-12 MED ORDER — TRAZODONE HCL 100 MG PO TABS: 100.0000 mg | ORAL_TABLET | Freq: Every day | ORAL | 2 refills | Status: AC

## 2024-11-23 ENCOUNTER — Ambulatory Visit: Admitting: Podiatry

## 2024-12-03 ENCOUNTER — Ambulatory Visit: Admitting: Podiatry

## 2024-12-03 NOTE — Patient Instructions (Signed)

## 2024-12-03 NOTE — Progress Notes (Unsigned)
 Half of nail came off after hitting  Total temp right

## 2024-12-08 ENCOUNTER — Ambulatory Visit

## 2024-12-17 ENCOUNTER — Ambulatory Visit: Admitting: Podiatry

## 2024-12-18 ENCOUNTER — Telehealth (HOSPITAL_COMMUNITY): Admitting: Psychiatry

## 2024-12-30 ENCOUNTER — Ambulatory Visit: Admitting: Allergy

## 2025-06-01 ENCOUNTER — Ambulatory Visit: Admitting: Obstetrics and Gynecology
